# Patient Record
Sex: Female | Born: 1950
Health system: Southern US, Community
[De-identification: ages and names within clinical notes are randomized; demographics above are authoritative.]

## PROBLEM LIST (undated history)

## (undated) DIAGNOSIS — E785 Hyperlipidemia, unspecified: Secondary | ICD-10-CM

## (undated) DIAGNOSIS — T7840XA Allergy, unspecified, initial encounter: Secondary | ICD-10-CM

## (undated) DIAGNOSIS — I10 Essential (primary) hypertension: Secondary | ICD-10-CM

## (undated) DIAGNOSIS — R7303 Prediabetes: Secondary | ICD-10-CM

## (undated) DIAGNOSIS — I499 Cardiac arrhythmia, unspecified: Secondary | ICD-10-CM

## (undated) DIAGNOSIS — D689 Coagulation defect, unspecified: Secondary | ICD-10-CM

## (undated) DIAGNOSIS — I219 Acute myocardial infarction, unspecified: Secondary | ICD-10-CM

## (undated) DIAGNOSIS — I2699 Other pulmonary embolism without acute cor pulmonale: Secondary | ICD-10-CM

## (undated) DIAGNOSIS — R0600 Dyspnea, unspecified: Secondary | ICD-10-CM

## (undated) DIAGNOSIS — I4891 Unspecified atrial fibrillation: Secondary | ICD-10-CM

## (undated) DIAGNOSIS — Z9289 Personal history of other medical treatment: Secondary | ICD-10-CM

## (undated) DIAGNOSIS — D649 Anemia, unspecified: Secondary | ICD-10-CM

## (undated) DIAGNOSIS — G709 Myoneural disorder, unspecified: Secondary | ICD-10-CM

## (undated) DIAGNOSIS — M169 Osteoarthritis of hip, unspecified: Secondary | ICD-10-CM

## (undated) DIAGNOSIS — G43909 Migraine, unspecified, not intractable, without status migrainosus: Secondary | ICD-10-CM

## (undated) DIAGNOSIS — M1990 Unspecified osteoarthritis, unspecified site: Secondary | ICD-10-CM

## (undated) DIAGNOSIS — I509 Heart failure, unspecified: Secondary | ICD-10-CM

## (undated) HISTORY — PX: COLONOSCOPY: SHX174

## (undated) HISTORY — DX: Migraine, unspecified, not intractable, without status migrainosus: G43.909

## (undated) HISTORY — PX: TONSILLECTOMY: SUR1361

## (undated) HISTORY — PX: ABDOMINAL HYSTERECTOMY: SHX81

## (undated) HISTORY — PX: TUBAL LIGATION: SHX77

## (undated) HISTORY — DX: Coagulation defect, unspecified: D68.9

## (undated) HISTORY — DX: Myoneural disorder, unspecified: G70.9

## (undated) HISTORY — DX: Unspecified atrial fibrillation: I48.91

## (undated) HISTORY — PX: BUNIONECTOMY: SHX129

## (undated) HISTORY — DX: Essential (primary) hypertension: I10

## (undated) HISTORY — DX: Anemia, unspecified: D64.9

## (undated) HISTORY — DX: Hyperlipidemia, unspecified: E78.5

## (undated) HISTORY — DX: Acute myocardial infarction, unspecified: I21.9

## (undated) HISTORY — DX: Allergy, unspecified, initial encounter: T78.40XA

## (undated) HISTORY — DX: Osteoarthritis of hip, unspecified: M16.9

## (undated) HISTORY — PX: SPINE SURGERY: SHX786

## (undated) HISTORY — DX: Other pulmonary embolism without acute cor pulmonale: I26.99

## (undated) HISTORY — PX: DG TUMB RIGHT HAND: HXRAD1659

## (undated) HISTORY — DX: Unspecified osteoarthritis, unspecified site: M19.90

## (undated) HISTORY — PX: CARDIAC CATHETERIZATION: SHX172

## (undated) HISTORY — PX: JOINT REPLACEMENT: SHX530

---
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
## 1998-06-09 ENCOUNTER — Encounter: Admission: RE | Admit: 1998-06-09 | Discharge: 1998-06-09 | Payer: Self-pay | Admitting: *Deleted

## 1998-08-19 ENCOUNTER — Ambulatory Visit (HOSPITAL_COMMUNITY): Admission: RE | Admit: 1998-08-19 | Discharge: 1998-08-19 | Payer: Self-pay | Admitting: Internal Medicine

## 1998-10-23 DIAGNOSIS — I219 Acute myocardial infarction, unspecified: Secondary | ICD-10-CM

## 1998-10-23 HISTORY — DX: Acute myocardial infarction, unspecified: I21.9

## 1999-07-14 ENCOUNTER — Inpatient Hospital Stay (HOSPITAL_COMMUNITY): Admission: EM | Admit: 1999-07-14 | Discharge: 1999-07-17 | Payer: Self-pay | Admitting: Emergency Medicine

## 1999-07-14 ENCOUNTER — Encounter: Payer: Self-pay | Admitting: Emergency Medicine

## 1999-09-13 ENCOUNTER — Ambulatory Visit (HOSPITAL_COMMUNITY): Admission: RE | Admit: 1999-09-13 | Discharge: 1999-09-13 | Payer: Self-pay | Admitting: Internal Medicine

## 1999-09-13 ENCOUNTER — Encounter: Payer: Self-pay | Admitting: Internal Medicine

## 2000-05-02 ENCOUNTER — Emergency Department (HOSPITAL_COMMUNITY): Admission: EM | Admit: 2000-05-02 | Discharge: 2000-05-02 | Payer: Self-pay | Admitting: Emergency Medicine

## 2000-05-02 ENCOUNTER — Encounter: Payer: Self-pay | Admitting: Emergency Medicine

## 2001-09-06 ENCOUNTER — Encounter: Admission: RE | Admit: 2001-09-06 | Discharge: 2001-09-06 | Payer: Self-pay | Admitting: Internal Medicine

## 2001-09-06 ENCOUNTER — Encounter: Payer: Self-pay | Admitting: Internal Medicine

## 2001-12-13 ENCOUNTER — Emergency Department (HOSPITAL_COMMUNITY): Admission: EM | Admit: 2001-12-13 | Discharge: 2001-12-13 | Payer: Self-pay | Admitting: Emergency Medicine

## 2001-12-14 ENCOUNTER — Encounter: Payer: Self-pay | Admitting: Emergency Medicine

## 2001-12-20 ENCOUNTER — Encounter: Admission: RE | Admit: 2001-12-20 | Discharge: 2001-12-20 | Payer: Self-pay | Admitting: Internal Medicine

## 2001-12-20 ENCOUNTER — Encounter: Payer: Self-pay | Admitting: Internal Medicine

## 2002-10-29 ENCOUNTER — Encounter: Payer: Self-pay | Admitting: Internal Medicine

## 2002-10-29 ENCOUNTER — Encounter: Admission: RE | Admit: 2002-10-29 | Discharge: 2002-10-29 | Payer: Self-pay | Admitting: Internal Medicine

## 2002-12-12 ENCOUNTER — Encounter: Payer: Self-pay | Admitting: Emergency Medicine

## 2002-12-12 ENCOUNTER — Emergency Department (HOSPITAL_COMMUNITY): Admission: EM | Admit: 2002-12-12 | Discharge: 2002-12-13 | Payer: Self-pay | Admitting: Emergency Medicine

## 2003-11-03 ENCOUNTER — Encounter: Admission: RE | Admit: 2003-11-03 | Discharge: 2003-11-03 | Payer: Self-pay | Admitting: Internal Medicine

## 2004-03-16 ENCOUNTER — Emergency Department (HOSPITAL_COMMUNITY): Admission: EM | Admit: 2004-03-16 | Discharge: 2004-03-16 | Payer: Self-pay | Admitting: Family Medicine

## 2004-09-02 ENCOUNTER — Ambulatory Visit: Payer: Self-pay | Admitting: Gastroenterology

## 2004-09-19 ENCOUNTER — Ambulatory Visit: Payer: Self-pay | Admitting: Gastroenterology

## 2005-01-03 ENCOUNTER — Encounter: Admission: RE | Admit: 2005-01-03 | Discharge: 2005-01-03 | Payer: Self-pay | Admitting: Internal Medicine

## 2005-01-24 ENCOUNTER — Encounter: Admission: RE | Admit: 2005-01-24 | Discharge: 2005-01-24 | Payer: Self-pay | Admitting: Internal Medicine

## 2005-11-14 ENCOUNTER — Encounter: Admission: RE | Admit: 2005-11-14 | Discharge: 2005-11-14 | Payer: Self-pay | Admitting: Occupational Medicine

## 2005-12-18 ENCOUNTER — Emergency Department (HOSPITAL_COMMUNITY): Admission: EM | Admit: 2005-12-18 | Discharge: 2005-12-18 | Payer: Self-pay | Admitting: Emergency Medicine

## 2006-01-24 ENCOUNTER — Encounter: Admission: RE | Admit: 2006-01-24 | Discharge: 2006-01-24 | Payer: Self-pay | Admitting: Internal Medicine

## 2007-01-18 ENCOUNTER — Encounter: Admission: RE | Admit: 2007-01-18 | Discharge: 2007-01-18 | Payer: Self-pay | Admitting: Internal Medicine

## 2007-07-16 ENCOUNTER — Emergency Department (HOSPITAL_COMMUNITY): Admission: EM | Admit: 2007-07-16 | Discharge: 2007-07-16 | Payer: Self-pay | Admitting: Family Medicine

## 2007-08-15 ENCOUNTER — Emergency Department (HOSPITAL_COMMUNITY): Admission: EM | Admit: 2007-08-15 | Discharge: 2007-08-15 | Payer: Self-pay | Admitting: Emergency Medicine

## 2008-01-21 ENCOUNTER — Encounter: Admission: RE | Admit: 2008-01-21 | Discharge: 2008-01-21 | Payer: Self-pay | Admitting: Internal Medicine

## 2008-08-25 LAB — HEPATIC FUNCTION PANEL
ALT: 9 U/L (ref 7–35)
AST: 16 U/L (ref 13–35)
Alkaline Phosphatase: 99 U/L (ref 25–125)
Bilirubin, Total: 0.7 mg/dL

## 2008-08-25 LAB — CBC AND DIFFERENTIAL
HCT: 37 % (ref 36–46)
Hemoglobin: 11.1 g/dL — AB (ref 12.0–16.0)
Platelets: 336 10*3/uL (ref 150–399)
WBC: 7.4 10^3/mL

## 2008-08-25 LAB — BASIC METABOLIC PANEL
BUN: 11 mg/dL (ref 4–21)
Creatinine: 0.8 mg/dL (ref 0.5–1.1)
Glucose: 80 mg/dL
Potassium: 4.6 mmol/L (ref 3.4–5.3)
Sodium: 141 mmol/L (ref 137–147)

## 2008-08-25 LAB — LIPID PANEL
Cholesterol: 180 mg/dL (ref 0–200)
HDL: 44 mg/dL (ref 35–70)
LDL Cholesterol: 118 mg/dL
Triglycerides: 91 mg/dL (ref 40–160)

## 2008-09-05 ENCOUNTER — Emergency Department (HOSPITAL_COMMUNITY): Admission: EM | Admit: 2008-09-05 | Discharge: 2008-09-05 | Payer: Self-pay | Admitting: Emergency Medicine

## 2008-12-23 ENCOUNTER — Emergency Department (HOSPITAL_COMMUNITY): Admission: EM | Admit: 2008-12-23 | Discharge: 2008-12-23 | Payer: Self-pay | Admitting: Family Medicine

## 2009-01-21 ENCOUNTER — Encounter: Admission: RE | Admit: 2009-01-21 | Discharge: 2009-01-21 | Payer: Self-pay | Admitting: Internal Medicine

## 2009-04-14 ENCOUNTER — Emergency Department (HOSPITAL_COMMUNITY): Admission: EM | Admit: 2009-04-14 | Discharge: 2009-04-14 | Payer: Self-pay | Admitting: Family Medicine

## 2009-04-15 ENCOUNTER — Encounter: Payer: Self-pay | Admitting: Family Medicine

## 2009-04-15 ENCOUNTER — Ambulatory Visit: Admission: RE | Admit: 2009-04-15 | Discharge: 2009-04-15 | Payer: Self-pay | Admitting: Family Medicine

## 2009-04-15 ENCOUNTER — Ambulatory Visit: Payer: Self-pay | Admitting: Surgery

## 2009-04-16 LAB — HEPATIC FUNCTION PANEL
ALT: 8 U/L (ref 7–35)
AST: 11 U/L — AB (ref 13–35)
Alkaline Phosphatase: 102 U/L (ref 25–125)
Bilirubin, Direct: 0.1 mg/dL (ref 0.01–0.4)
Bilirubin, Total: 0.4 mg/dL

## 2009-04-16 LAB — LIPID PANEL
Cholesterol: 185 mg/dL (ref 0–200)
HDL: 37 mg/dL (ref 35–70)
LDL Cholesterol: 132 mg/dL
Triglycerides: 78 mg/dL (ref 40–160)

## 2009-08-25 ENCOUNTER — Encounter: Admission: RE | Admit: 2009-08-25 | Discharge: 2009-08-25 | Payer: Self-pay | Admitting: Internal Medicine

## 2009-08-25 LAB — BASIC METABOLIC PANEL
BUN: 17 mg/dL (ref 4–21)
Creatinine: 0.8 mg/dL (ref 0.5–1.1)
Glucose: 90 mg/dL
Potassium: 4.2 mmol/L (ref 3.4–5.3)
Sodium: 140 mmol/L (ref 137–147)

## 2009-08-25 LAB — HEPATIC FUNCTION PANEL
ALT: 8 U/L (ref 7–35)
AST: 14 U/L (ref 13–35)
Alkaline Phosphatase: 98 U/L (ref 25–125)
Bilirubin, Total: 0.5 mg/dL

## 2009-08-25 LAB — CBC AND DIFFERENTIAL
HCT: 36 % (ref 36–46)
Hemoglobin: 10.9 g/dL — AB (ref 12.0–16.0)
Platelets: 358 10*3/uL (ref 150–399)
WBC: 7.5 10^3/mL

## 2009-08-25 LAB — LIPID PANEL
Cholesterol: 204 mg/dL — AB (ref 0–200)
HDL: 38 mg/dL (ref 35–70)
LDL Cholesterol: 145 mg/dL
Triglycerides: 107 mg/dL (ref 40–160)

## 2009-09-19 ENCOUNTER — Encounter: Admission: RE | Admit: 2009-09-19 | Discharge: 2009-09-19 | Payer: Self-pay | Admitting: Internal Medicine

## 2009-11-19 ENCOUNTER — Encounter: Admission: RE | Admit: 2009-11-19 | Discharge: 2009-11-19 | Payer: Self-pay | Admitting: Specialist

## 2010-02-18 ENCOUNTER — Encounter: Admission: RE | Admit: 2010-02-18 | Discharge: 2010-02-18 | Payer: Self-pay | Admitting: Internal Medicine

## 2011-01-30 LAB — CBC
HCT: 34.6 % — ABNORMAL LOW (ref 36.0–46.0)
Hemoglobin: 11.4 g/dL — ABNORMAL LOW (ref 12.0–15.0)
MCHC: 32.8 g/dL (ref 30.0–36.0)
MCV: 87.9 fL (ref 78.0–100.0)
Platelets: 320 10*3/uL (ref 150–400)
RBC: 3.94 MIL/uL (ref 3.87–5.11)
RDW: 14.5 % (ref 11.5–15.5)
WBC: 6.4 10*3/uL (ref 4.0–10.5)

## 2011-01-30 LAB — DIFFERENTIAL
Basophils Absolute: 0 10*3/uL (ref 0.0–0.1)
Basophils Relative: 0 % (ref 0–1)
Eosinophils Absolute: 0.2 10*3/uL (ref 0.0–0.7)
Eosinophils Relative: 3 % (ref 0–5)
Lymphocytes Relative: 30 % (ref 12–46)
Lymphs Abs: 1.9 10*3/uL (ref 0.7–4.0)
Monocytes Absolute: 0.5 10*3/uL (ref 0.1–1.0)
Monocytes Relative: 7 % (ref 3–12)
Neutro Abs: 3.8 10*3/uL (ref 1.7–7.7)
Neutrophils Relative %: 59 % (ref 43–77)

## 2011-03-22 ENCOUNTER — Other Ambulatory Visit: Payer: Self-pay | Admitting: Internal Medicine

## 2011-03-22 DIAGNOSIS — Z1231 Encounter for screening mammogram for malignant neoplasm of breast: Secondary | ICD-10-CM

## 2011-03-28 ENCOUNTER — Ambulatory Visit
Admission: RE | Admit: 2011-03-28 | Discharge: 2011-03-28 | Disposition: A | Discharge status: Home / Self Care | Payer: Self-pay | Source: Ambulatory Visit | Attending: Internal Medicine | Admitting: Internal Medicine

## 2011-03-28 DIAGNOSIS — Z1231 Encounter for screening mammogram for malignant neoplasm of breast: Secondary | ICD-10-CM

## 2011-08-02 LAB — POCT RAPID STREP A: Streptococcus, Group A Screen (Direct): NEGATIVE

## 2011-08-03 LAB — POCT URINALYSIS DIP (DEVICE)
Bilirubin Urine: NEGATIVE
Glucose, UA: NEGATIVE
Ketones, ur: NEGATIVE
Nitrite: NEGATIVE
Operator id: 235561
Protein, ur: NEGATIVE
Specific Gravity, Urine: 1.02
Urobilinogen, UA: 1
pH: 6.5

## 2012-02-16 ENCOUNTER — Other Ambulatory Visit: Payer: Self-pay | Admitting: Internal Medicine

## 2012-02-16 DIAGNOSIS — Z1231 Encounter for screening mammogram for malignant neoplasm of breast: Secondary | ICD-10-CM

## 2012-03-29 ENCOUNTER — Ambulatory Visit
Admission: RE | Admit: 2012-03-29 | Discharge: 2012-03-29 | Disposition: A | Discharge status: Home / Self Care | Payer: Medicare HMO | Source: Ambulatory Visit | Attending: Internal Medicine | Admitting: Internal Medicine

## 2012-03-29 DIAGNOSIS — Z1231 Encounter for screening mammogram for malignant neoplasm of breast: Secondary | ICD-10-CM

## 2012-04-09 ENCOUNTER — Ambulatory Visit (HOSPITAL_COMMUNITY)
Admission: RE | Admit: 2012-04-09 | Discharge: 2012-04-09 | Disposition: A | Discharge status: Home / Self Care | Payer: Medicare HMO | Source: Ambulatory Visit | Attending: Internal Medicine | Admitting: Internal Medicine

## 2012-04-09 DIAGNOSIS — M7989 Other specified soft tissue disorders: Secondary | ICD-10-CM

## 2012-04-09 DIAGNOSIS — M79609 Pain in unspecified limb: Secondary | ICD-10-CM | POA: Insufficient documentation

## 2012-04-09 DIAGNOSIS — R52 Pain, unspecified: Secondary | ICD-10-CM

## 2012-04-09 NOTE — Progress Notes (Signed)
Right lower extremity venous duplex completed.  Preliminary report is negative for DVT, SVT, or a Baker's cyst in the right leg.  Negative for DVT in the left common femoral vein. 

## 2012-07-25 ENCOUNTER — Encounter: Payer: Self-pay | Admitting: Gastroenterology

## 2012-08-16 ENCOUNTER — Encounter: Payer: Self-pay | Admitting: Gastroenterology

## 2012-08-27 ENCOUNTER — Ambulatory Visit (AMBULATORY_SURGERY_CENTER): Payer: Medicare HMO

## 2012-08-27 VITALS — Ht 64.0 in | Wt 168.8 lb

## 2012-08-27 DIAGNOSIS — Z1211 Encounter for screening for malignant neoplasm of colon: Secondary | ICD-10-CM

## 2012-08-27 MED ORDER — NA SULFATE-K SULFATE-MG SULF 17.5-3.13-1.6 GM/177ML PO SOLN: 1.0000 | Freq: Once | ORAL | Status: DC

## 2012-08-29 LAB — BASIC METABOLIC PANEL
BUN: 16 mg/dL (ref 4–21)
Creatinine: 0.8 mg/dL (ref 0.5–1.1)
Glucose: 80 mg/dL
Potassium: 3.9 mmol/L (ref 3.4–5.3)
Sodium: 140 mmol/L (ref 137–147)

## 2012-08-29 LAB — LIPID PANEL
Cholesterol: 235 mg/dL — AB (ref 0–200)
HDL: 36 mg/dL (ref 35–70)
LDL Cholesterol: 177 mg/dL
Triglycerides: 110 mg/dL (ref 40–160)

## 2012-08-29 LAB — CBC AND DIFFERENTIAL
HCT: 32 % — AB (ref 36–46)
Hemoglobin: 10.6 g/dL — AB (ref 12.0–16.0)
Platelets: 365 10*3/uL (ref 150–399)
WBC: 6 10^3/mL

## 2012-08-29 LAB — HEPATIC FUNCTION PANEL
ALT: 9 U/L (ref 7–35)
AST: 16 U/L (ref 13–35)
Alkaline Phosphatase: 83 U/L (ref 25–125)
Bilirubin, Total: 0.7 mg/dL

## 2012-09-10 ENCOUNTER — Encounter: Payer: Self-pay | Admitting: Gastroenterology

## 2012-09-10 ENCOUNTER — Ambulatory Visit (AMBULATORY_SURGERY_CENTER): Payer: Medicare HMO | Admitting: Gastroenterology

## 2012-09-10 VITALS — BP 128/71 | HR 65 | Temp 98.5°F | Resp 22 | Ht 64.0 in | Wt 168.0 lb

## 2012-09-10 DIAGNOSIS — Z1211 Encounter for screening for malignant neoplasm of colon: Secondary | ICD-10-CM

## 2012-09-10 DIAGNOSIS — K5730 Diverticulosis of large intestine without perforation or abscess without bleeding: Secondary | ICD-10-CM

## 2012-09-10 MED ORDER — SODIUM CHLORIDE 0.9 % IV SOLN: 500.0000 mL | INTRAVENOUS | Status: DC

## 2012-09-10 NOTE — Progress Notes (Signed)
Propofol given over incremental dosages 

## 2012-09-10 NOTE — Progress Notes (Signed)
Patient did not experience any of the following events: a burn prior to discharge; a fall within the facility; wrong site/side/patient/procedure/implant event; or a hospital transfer or hospital admission upon discharge from the facility. (G8907) Patient did not have preoperative order for IV antibiotic SSI prophylaxis. (G8918)   Charted by April Mirts RN 

## 2012-09-10 NOTE — Patient Instructions (Addendum)
YOU HAD AN ENDOSCOPIC PROCEDURE TODAY AT THE [facility]: Refer to the procedure report that was given to you for any specific questions about what was found during the examination.  If the procedure report does not answer your questions, please call your gastroenterologist to clarify.  If you requested that your care partner not be given the details of your procedure findings, then the procedure report has been included in a sealed envelope for you to review at your convenience later.  YOU SHOULD EXPECT: Some feelings of bloating in the abdomen. Passage of more gas than usual.  Walking can help get rid of the air that was put into your GI tract during the procedure and reduce the bloating. If you had a lower endoscopy (such as a colonoscopy or flexible sigmoidoscopy) you may notice spotting of blood in your stool or on the toilet paper. If you underwent a bowel prep for your procedure, then you may not have a normal bowel movement for a few days.  DIET: Your first meal following the procedure should be a light meal and then it is ok to progress to your normal diet.  A half-sandwich or bowl of soup is an example of a good first meal.  Heavy or fried foods are harder to digest and may make you feel nauseous or bloated.  Likewise meals heavy in dairy and vegetables can cause extra gas to form and this can also increase the bloating.  Drink plenty of fluids but you should avoid alcoholic beverages for 24 hours.  ACTIVITY: Your care partner should take you home directly after the procedure.  You should plan to take it easy, moving slowly for the rest of the day.  You can resume normal activity the day after the procedure however you should NOT DRIVE or use heavy machinery for 24 hours (because of the sedation medicines used during the test).    SYMPTOMS TO REPORT IMMEDIATELY: A gastroenterologist can be reached at any hour.  During normal business hours, 8:30 AM to 5:00 PM Monday through Friday,  call [phone].  After hours and on weekends, please call the GI answering service at [phone] who will take a message and have the physician on call contact you.   Following lower endoscopy (colonoscopy or flexible sigmoidoscopy):  Excessive amounts of blood in the stool  Significant tenderness or worsening of abdominal pains  Swelling of the abdomen that is new, acute  Fever of 100F or higher  FOLLOW UP: If any biopsies were taken you will be contacted by phone or by letter within the next 1-3 weeks.  Call your gastroenterologist if you have not heard about the biopsies in 3 weeks.  Our staff will call the home number listed on your records the next business day following your procedure to check on you and address any questions or concerns that you may have at that time regarding the information given to you following your procedure. This is a courtesy call and so if there is no answer at the home number and we have not heard from you through the emergency physician on call, we will assume that you have returned to your regular daily activities without incident.  SIGNATURES/CONFIDENTIALITY: You and/or your care partner have signed paperwork which will be entered into your electronic medical record.  These signatures attest to the fact that that the information above on your After Visit Summary has been reviewed and is understood.  Full responsibility of the confidentiality of this   discharge information lies with you and/or your care-partner.   Diverticulosis-handout given  High Fiber Diet-handout given   

## 2012-09-10 NOTE — Op Note (Signed)
[facility] [address] [place], [address]   COLONOSCOPY PROCEDURE REPORT  PATIENT: Samantha, Stevenson  MR#: [id number] BIRTHDATE: September 24, 1951 , 61  yrs. old GENDER: Female ENDOSCOPIST: Louis Meckel, MD REFERRED XB:JYNWG Chilton Si, M.D. PROCEDURE DATE:  09/10/2012 PROCEDURE:   Colonoscopy, diagnostic ASA CLASS:   Class II INDICATIONS: MEDICATIONS: MAC sedation, administered by CRNA and propofol (Diprivan) 100mg  IV  DESCRIPTION OF PROCEDURE:   After the risks benefits and alternatives of the procedure were thoroughly explained, informed consent was obtained.  A digital rectal exam revealed no abnormalities of the rectum.   The LB [id number]  endoscope was introduced through the anus and advanced to the cecum, which was identified by both the appendix and ileocecal valve. No adverse events experienced.   The quality of the prep was Suprep excellent The instrument was then slowly withdrawn as the colon was fully examined.      COLON FINDINGS: Severe diverticulosis was noted in the sigmoid colon.   There are areas of submucosal hemorrhage in the rectal vault that probably is due to the prep. The colon mucosa was otherwise normal.  Retroflexed views revealed no abnormalities. The time to cecum=5 minutes 50 seconds.  Withdrawal time=6 minutes 59 seconds.  The scope was withdrawn and the procedure completed. COMPLICATIONS: There were no complications.  ENDOSCOPIC IMPRESSION: 1.   Severe diverticulosis was noted in the sigmoid colon 2.   The colon mucosa was otherwise normal  RECOMMENDATIONS: Continue current colorectal screening recommendations for "routine risk" patients with a repeat colonoscopy in 10 years.   eSigned:  Louis Meckel, MD 09/10/2012 4:30 PM   cc:

## 2012-09-11 ENCOUNTER — Telehealth: Payer: Self-pay

## 2012-09-11 NOTE — Telephone Encounter (Signed)
  Follow up Call-  Call back number 09/10/2012  Post procedure Call Back phone  # [phone] or [phone]  Permission to leave phone message Yes     Patient questions:  Do you have a fever, pain , or abdominal swelling? no Pain Score  0 *  Have you tolerated food without any problems? yes  Have you been able to return to your normal activities? yes  Do you have any questions about your discharge instructions: Diet   no Medications  no Follow up visit  no  Do you have questions or concerns about your Care? no  Actions: * If pain score is 4 or above: No action needed, pain <4.

## 2012-11-22 ENCOUNTER — Other Ambulatory Visit (HOSPITAL_COMMUNITY): Payer: Self-pay | Admitting: Orthopaedic Surgery

## 2012-12-23 ENCOUNTER — Encounter (HOSPITAL_COMMUNITY): Payer: Self-pay | Admitting: Pharmacy Technician

## 2012-12-26 ENCOUNTER — Other Ambulatory Visit (HOSPITAL_COMMUNITY): Payer: Self-pay | Admitting: *Deleted

## 2012-12-27 ENCOUNTER — Inpatient Hospital Stay (HOSPITAL_COMMUNITY): Admission: RE | Admit: 2012-12-27 | Payer: Medicare HMO | Source: Ambulatory Visit

## 2012-12-30 NOTE — Patient Instructions (Addendum)
Samantha Stevenson  12/30/2012   Your procedure is scheduled on: 01/03/13   Report to [facility] at     AM.  Call this number if you have problems the morning of surgery: [phone]   Remember:   Do not eat food or drink liquids after midnight.   Take these medicines the morning of surgery with A SIP OF WATER: NONE (UNABLE TO TAKE DIGOXIN / DILTIAZEM WITHOUT FOOD PER PT)   Do not wear jewelry, make-up or nail polish.  Do not wear lotions, powders, or perfumes.   Do not shave 48 hours prior to surgery.  Do not bring valuables to the hospital.  Contacts, dentures or bridgework may not be worn into surgery.  Leave suitcase in the car. After surgery it may be brought to your room.  For patients admitted to the hospital, checkout time is 11:00 AM the day of  discharge.   SEE CHG INSTRUCTION SHEET    Please read over the following fact sheets that you were given: MRSA Information, Blood Transfusion Fact sheet , Shower instruction sheet               Failure to comply with these instructions may result in cancellation of your surgery.                Patient Signature ____________________________              Nurse Signature _____________________________

## 2012-12-31 ENCOUNTER — Encounter (HOSPITAL_COMMUNITY)
Admission: RE | Admit: 2012-12-31 | Discharge: 2012-12-31 | Disposition: A | Discharge status: Home / Self Care | Payer: Medicare HMO | Source: Ambulatory Visit | Attending: Orthopaedic Surgery | Admitting: Orthopaedic Surgery

## 2012-12-31 ENCOUNTER — Ambulatory Visit (HOSPITAL_COMMUNITY)
Admission: RE | Admit: 2012-12-31 | Discharge: 2012-12-31 | Disposition: A | Discharge status: Home / Self Care | Payer: Medicare HMO | Source: Ambulatory Visit | Attending: Orthopaedic Surgery | Admitting: Orthopaedic Surgery

## 2012-12-31 ENCOUNTER — Encounter (HOSPITAL_COMMUNITY): Payer: Self-pay

## 2012-12-31 DIAGNOSIS — M169 Osteoarthritis of hip, unspecified: Secondary | ICD-10-CM | POA: Insufficient documentation

## 2012-12-31 DIAGNOSIS — I10 Essential (primary) hypertension: Secondary | ICD-10-CM | POA: Insufficient documentation

## 2012-12-31 DIAGNOSIS — E785 Hyperlipidemia, unspecified: Secondary | ICD-10-CM | POA: Insufficient documentation

## 2012-12-31 DIAGNOSIS — M1610 Unilateral primary osteoarthritis, unspecified hip: Secondary | ICD-10-CM | POA: Insufficient documentation

## 2012-12-31 DIAGNOSIS — I252 Old myocardial infarction: Secondary | ICD-10-CM | POA: Insufficient documentation

## 2012-12-31 DIAGNOSIS — K219 Gastro-esophageal reflux disease without esophagitis: Secondary | ICD-10-CM | POA: Insufficient documentation

## 2012-12-31 DIAGNOSIS — M4120 Other idiopathic scoliosis, site unspecified: Secondary | ICD-10-CM | POA: Insufficient documentation

## 2012-12-31 DIAGNOSIS — I4891 Unspecified atrial fibrillation: Secondary | ICD-10-CM | POA: Insufficient documentation

## 2012-12-31 DIAGNOSIS — Z01812 Encounter for preprocedural laboratory examination: Secondary | ICD-10-CM | POA: Insufficient documentation

## 2012-12-31 LAB — CBC
HCT: 33.8 % — ABNORMAL LOW (ref 36.0–46.0)
Hemoglobin: 10.6 g/dL — ABNORMAL LOW (ref 12.0–15.0)
MCH: 26.9 pg (ref 26.0–34.0)
MCHC: 31.4 g/dL (ref 30.0–36.0)
MCV: 85.8 fL (ref 78.0–100.0)
Platelets: 386 10*3/uL (ref 150–400)
RBC: 3.94 MIL/uL (ref 3.87–5.11)
RDW: 14.8 % (ref 11.5–15.5)
WBC: 10.2 10*3/uL (ref 4.0–10.5)

## 2012-12-31 LAB — BASIC METABOLIC PANEL
BUN: 16 mg/dL (ref 6–23)
CO2: 27 mEq/L (ref 19–32)
Calcium: 9.4 mg/dL (ref 8.4–10.5)
Chloride: 102 mEq/L (ref 96–112)
Creatinine, Ser: 0.74 mg/dL (ref 0.50–1.10)
GFR calc Af Amer: >90 mL/min (ref >90)
GFR calc non Af Amer: >90 mL/min (ref >90)
Glucose, Bld: 84 mg/dL (ref 70–99)
Potassium: 4.1 mEq/L (ref 3.5–5.1)
Sodium: 138 mEq/L (ref 135–145)

## 2012-12-31 LAB — PROTIME-INR
INR: 1.01 (ref 0.00–1.49)
Prothrombin Time: 13.2 seconds (ref 11.6–15.2)

## 2012-12-31 LAB — URINALYSIS, ROUTINE W REFLEX MICROSCOPIC
Bilirubin Urine: NEGATIVE
Glucose, UA: NEGATIVE mg/dL
Hgb urine dipstick: NEGATIVE
Ketones, ur: NEGATIVE mg/dL
Leukocytes, UA: NEGATIVE
Nitrite: NEGATIVE
Protein, ur: NEGATIVE mg/dL
Specific Gravity, Urine: 1.029 (ref 1.005–1.030)
Urobilinogen, UA: 0.2 mg/dL (ref 0.0–1.0)
pH: 5 (ref 5.0–8.0)

## 2012-12-31 LAB — SURGICAL PCR SCREEN
MRSA, PCR: NEGATIVE
Staphylococcus aureus: NEGATIVE

## 2012-12-31 LAB — ABO/RH: ABO/RH(D): B POS

## 2012-12-31 LAB — APTT: aPTT: 32 seconds (ref 24–37)

## 2012-12-31 NOTE — Progress Notes (Signed)
Dr. Council Mechanic made aware of fact that pt insists she is unable to take meds without food or she will vomit meds . He said to put note on front of chart for anesthesia and they can give med via IV if they need to. Note placed as Requested.

## 2012-12-31 NOTE — Progress Notes (Signed)
12/31/12 0944  OBSTRUCTIVE SLEEP APNEA  Have you ever been diagnosed with sleep apnea through a sleep study? No  Do you snore loudly (loud enough to be heard through closed doors)?  1  Do you often feel tired, fatigued, or sleepy during the daytime? 1  Has anyone observed you stop breathing during your sleep? 1  Do you have, or are you being treated for high blood pressure? 1  BMI more than 35 kg/m2? 0  Age over 60 years old? 1  Gender: 0  Obstructive Sleep Apnea Score 5  Score 4 or greater  Results sent to PCP

## 2013-01-03 ENCOUNTER — Encounter (HOSPITAL_COMMUNITY): Payer: Self-pay | Admitting: Anesthesiology

## 2013-01-03 ENCOUNTER — Inpatient Hospital Stay (HOSPITAL_COMMUNITY): Payer: Medicare HMO

## 2013-01-03 ENCOUNTER — Encounter (HOSPITAL_COMMUNITY): Payer: Self-pay | Admitting: *Deleted

## 2013-01-03 ENCOUNTER — Encounter (HOSPITAL_COMMUNITY)
Admission: RE | Disposition: A | Discharge status: Home Health | Payer: Self-pay | Source: Ambulatory Visit | Attending: Orthopaedic Surgery

## 2013-01-03 ENCOUNTER — Inpatient Hospital Stay (HOSPITAL_COMMUNITY): Payer: Medicare HMO | Admitting: Anesthesiology

## 2013-01-03 ENCOUNTER — Inpatient Hospital Stay (HOSPITAL_COMMUNITY)
Admission: RE | Admit: 2013-01-03 | Discharge: 2013-01-07 | DRG: 470 | Disposition: A | Discharge status: Home Health | Payer: Medicare HMO | Source: Ambulatory Visit | Attending: Orthopaedic Surgery | Admitting: Orthopaedic Surgery

## 2013-01-03 DIAGNOSIS — D649 Anemia, unspecified: Secondary | ICD-10-CM | POA: Diagnosis present

## 2013-01-03 DIAGNOSIS — M169 Osteoarthritis of hip, unspecified: Secondary | ICD-10-CM

## 2013-01-03 DIAGNOSIS — I252 Old myocardial infarction: Secondary | ICD-10-CM

## 2013-01-03 DIAGNOSIS — I4891 Unspecified atrial fibrillation: Secondary | ICD-10-CM | POA: Diagnosis present

## 2013-01-03 DIAGNOSIS — M1990 Unspecified osteoarthritis, unspecified site: Secondary | ICD-10-CM

## 2013-01-03 DIAGNOSIS — I10 Essential (primary) hypertension: Secondary | ICD-10-CM | POA: Diagnosis present

## 2013-01-03 DIAGNOSIS — M1610 Unilateral primary osteoarthritis, unspecified hip: Principal | ICD-10-CM | POA: Diagnosis present

## 2013-01-03 HISTORY — PX: TOTAL HIP ARTHROPLASTY: SHX124

## 2013-01-03 LAB — TYPE AND SCREEN
ABO/RH(D): B POS
Antibody Screen: NEGATIVE

## 2013-01-03 SURGERY — ARTHROPLASTY, HIP, TOTAL, ANTERIOR APPROACH
Anesthesia: General | Site: Hip | Laterality: Left | Wound class: Clean

## 2013-01-03 MED ORDER — DOCUSATE SODIUM 100 MG PO CAPS
100.0000 mg | ORAL_CAPSULE | Freq: Two times a day (BID) | ORAL | Status: DC
  Administered 2013-01-03 – 2013-01-07 (×8): 100 mg via ORAL
  Filled 2013-01-03 (×5): qty 1

## 2013-01-03 MED ORDER — PHENOL 1.4 % MT LIQD: 1.0000 | OROMUCOSAL | Status: DC | PRN

## 2013-01-03 MED ORDER — 0.9 % SODIUM CHLORIDE (POUR BTL) OPTIME
TOPICAL | Status: DC | PRN
  Administered 2013-01-03: 1000 mL

## 2013-01-03 MED ORDER — METHOCARBAMOL 500 MG PO TABS
500.0000 mg | ORAL_TABLET | Freq: Four times a day (QID) | ORAL | Status: DC | PRN
  Administered 2013-01-06 – 2013-01-07 (×2): 500 mg via ORAL
  Filled 2013-01-03 (×2): qty 1

## 2013-01-03 MED ORDER — FENTANYL CITRATE 0.05 MG/ML IJ SOLN
INTRAMUSCULAR | Status: DC | PRN
  Administered 2013-01-03 (×2): 50 ug via INTRAVENOUS

## 2013-01-03 MED ORDER — OXYCODONE HCL ER 10 MG PO T12A
10.0000 mg | EXTENDED_RELEASE_TABLET | Freq: Two times a day (BID) | ORAL | Status: DC
  Administered 2013-01-03 – 2013-01-07 (×8): 10 mg via ORAL
  Filled 2013-01-03 (×8): qty 1

## 2013-01-03 MED ORDER — FUROSEMIDE 20 MG PO TABS
20.0000 mg | ORAL_TABLET | Freq: Every day | ORAL | Status: DC | PRN
  Filled 2013-01-03: qty 1

## 2013-01-03 MED ORDER — METOCLOPRAMIDE HCL 10 MG PO TABS: 5.0000 mg | ORAL_TABLET | Freq: Three times a day (TID) | ORAL | Status: DC | PRN

## 2013-01-03 MED ORDER — DIGOXIN 250 MCG PO TABS
0.2500 mg | ORAL_TABLET | Freq: Every morning | ORAL | Status: DC
  Administered 2013-01-03 – 2013-01-07 (×5): 0.25 mg via ORAL
  Filled 2013-01-03 (×5): qty 1

## 2013-01-03 MED ORDER — SODIUM CHLORIDE 0.9 % IV SOLN
INTRAVENOUS | Status: DC | PRN
  Administered 2013-01-03: 1000 mL via INTRAMUSCULAR

## 2013-01-03 MED ORDER — FENTANYL CITRATE 0.05 MG/ML IJ SOLN: 25.0000 ug | INTRAMUSCULAR | Status: DC | PRN

## 2013-01-03 MED ORDER — ACETAMINOPHEN 650 MG RE SUPP: 650.0000 mg | Freq: Four times a day (QID) | RECTAL | Status: DC | PRN

## 2013-01-03 MED ORDER — SODIUM CHLORIDE 0.9 % IV SOLN
INTRAVENOUS | Status: DC
  Administered 2013-01-03 – 2013-01-04 (×3): via INTRAVENOUS

## 2013-01-03 MED ORDER — METHOCARBAMOL 100 MG/ML IJ SOLN: 500.0000 mg | Freq: Four times a day (QID) | INTRAVENOUS | Status: DC | PRN

## 2013-01-03 MED ORDER — MENTHOL 3 MG MT LOZG
1.0000 | LOZENGE | OROMUCOSAL | Status: DC | PRN
  Filled 2013-01-03: qty 9

## 2013-01-03 MED ORDER — BUPIVACAINE HCL (PF) 0.5 % IJ SOLN
INTRAMUSCULAR | Status: DC | PRN
  Administered 2013-01-03: 3 mL

## 2013-01-03 MED ORDER — KETAMINE HCL 10 MG/ML IJ SOLN
INTRAMUSCULAR | Status: DC | PRN
  Administered 2013-01-03: 20 mg via INTRAVENOUS

## 2013-01-03 MED ORDER — PRAVASTATIN SODIUM 40 MG PO TABS
40.0000 mg | ORAL_TABLET | Freq: Every day | ORAL | Status: DC
  Administered 2013-01-03 – 2013-01-06 (×4): 40 mg via ORAL
  Filled 2013-01-03 (×5): qty 1

## 2013-01-03 MED ORDER — DEXAMETHASONE SODIUM PHOSPHATE 10 MG/ML IJ SOLN
INTRAMUSCULAR | Status: DC | PRN
  Administered 2013-01-03: 10 mg via INTRAVENOUS

## 2013-01-03 MED ORDER — PROMETHAZINE HCL 25 MG/ML IJ SOLN: 6.2500 mg | INTRAMUSCULAR | Status: DC | PRN

## 2013-01-03 MED ORDER — LACTATED RINGERS IV SOLN
INTRAVENOUS | Status: DC | PRN
  Administered 2013-01-03 (×3): via INTRAVENOUS

## 2013-01-03 MED ORDER — OXYCODONE HCL 5 MG PO TABS
5.0000 mg | ORAL_TABLET | ORAL | Status: DC | PRN
  Administered 2013-01-03 – 2013-01-06 (×4): 10 mg via ORAL
  Administered 2013-01-06: 5 mg via ORAL
  Administered 2013-01-07 (×2): 10 mg via ORAL
  Filled 2013-01-03 (×4): qty 2
  Filled 2013-01-03: qty 1
  Filled 2013-01-03 (×2): qty 2

## 2013-01-03 MED ORDER — FERROUS SULFATE 325 (65 FE) MG PO TABS
325.0000 mg | ORAL_TABLET | Freq: Three times a day (TID) | ORAL | Status: DC
  Administered 2013-01-03 – 2013-01-07 (×12): 325 mg via ORAL
  Filled 2013-01-03 (×15): qty 1

## 2013-01-03 MED ORDER — HYDROMORPHONE HCL PF 1 MG/ML IJ SOLN
1.0000 mg | INTRAMUSCULAR | Status: DC | PRN
  Filled 2013-01-03: qty 1

## 2013-01-03 MED ORDER — STERILE WATER FOR IRRIGATION IR SOLN
Status: DC | PRN
  Administered 2013-01-03: 3000 mL

## 2013-01-03 MED ORDER — DILTIAZEM HCL ER 240 MG PO CP24
240.0000 mg | ORAL_CAPSULE | Freq: Every morning | ORAL | Status: DC
  Administered 2013-01-03 – 2013-01-07 (×5): 240 mg via ORAL
  Filled 2013-01-03 (×5): qty 1

## 2013-01-03 MED ORDER — METHOCARBAMOL 500 MG PO TABS: 500.0000 mg | ORAL_TABLET | Freq: Four times a day (QID) | ORAL | Status: DC | PRN

## 2013-01-03 MED ORDER — ACETAMINOPHEN 10 MG/ML IV SOLN
INTRAVENOUS | Status: DC | PRN
  Administered 2013-01-03: 1000 mg via INTRAVENOUS

## 2013-01-03 MED ORDER — MIDAZOLAM HCL 5 MG/5ML IJ SOLN
INTRAMUSCULAR | Status: DC | PRN
  Administered 2013-01-03: 2 mg via INTRAVENOUS

## 2013-01-03 MED ORDER — CLINDAMYCIN PHOSPHATE 900 MG/50ML IV SOLN
900.0000 mg | INTRAVENOUS | Status: AC
  Administered 2013-01-03: 900 mg via INTRAVENOUS

## 2013-01-03 MED ORDER — ONDANSETRON HCL 4 MG/2ML IJ SOLN
INTRAMUSCULAR | Status: DC | PRN
  Administered 2013-01-03: 4 mg via INTRAVENOUS

## 2013-01-03 MED ORDER — ACETAMINOPHEN 325 MG PO TABS: 650.0000 mg | ORAL_TABLET | Freq: Four times a day (QID) | ORAL | Status: DC | PRN

## 2013-01-03 MED ORDER — ONDANSETRON HCL 4 MG/2ML IJ SOLN: 4.0000 mg | Freq: Four times a day (QID) | INTRAMUSCULAR | Status: DC | PRN

## 2013-01-03 MED ORDER — METOCLOPRAMIDE HCL 5 MG/ML IJ SOLN: 5.0000 mg | Freq: Three times a day (TID) | INTRAMUSCULAR | Status: DC | PRN

## 2013-01-03 MED ORDER — ADULT MULTIVITAMIN W/MINERALS CH
1.0000 | ORAL_TABLET | Freq: Every day | ORAL | Status: DC
  Administered 2013-01-03 – 2013-01-07 (×5): 1 via ORAL
  Filled 2013-01-03 (×5): qty 1

## 2013-01-03 MED ORDER — PROPOFOL INFUSION 10 MG/ML OPTIME
INTRAVENOUS | Status: DC | PRN
  Administered 2013-01-03: 60 ug/kg/min via INTRAVENOUS

## 2013-01-03 MED ORDER — DIPHENHYDRAMINE HCL 12.5 MG/5ML PO ELIX: 12.5000 mg | ORAL_SOLUTION | ORAL | Status: DC | PRN

## 2013-01-03 MED ORDER — EPHEDRINE SULFATE 50 MG/ML IJ SOLN
INTRAMUSCULAR | Status: DC | PRN
  Administered 2013-01-03: 10 mg via INTRAVENOUS
  Administered 2013-01-03 (×2): 5 mg via INTRAVENOUS

## 2013-01-03 MED ORDER — ONDANSETRON HCL 4 MG PO TABS: 4.0000 mg | ORAL_TABLET | Freq: Four times a day (QID) | ORAL | Status: DC | PRN

## 2013-01-03 MED ORDER — ALUM & MAG HYDROXIDE-SIMETH 200-200-20 MG/5ML PO SUSP: 30.0000 mL | ORAL | Status: DC | PRN

## 2013-01-03 MED ORDER — ASPIRIN EC 325 MG PO TBEC
325.0000 mg | DELAYED_RELEASE_TABLET | Freq: Every day | ORAL | Status: DC
  Administered 2013-01-04 – 2013-01-07 (×4): 325 mg via ORAL
  Filled 2013-01-03 (×5): qty 1

## 2013-01-03 MED ORDER — SIMVASTATIN 20 MG PO TABS
20.0000 mg | ORAL_TABLET | Freq: Every day | ORAL | Status: DC
  Filled 2013-01-03: qty 1

## 2013-01-03 MED ORDER — ZOLPIDEM TARTRATE 5 MG PO TABS: 5.0000 mg | ORAL_TABLET | Freq: Every evening | ORAL | Status: DC | PRN

## 2013-01-03 MED ORDER — CLINDAMYCIN PHOSPHATE 600 MG/50ML IV SOLN
600.0000 mg | Freq: Four times a day (QID) | INTRAVENOUS | Status: AC
  Administered 2013-01-03 (×2): 600 mg via INTRAVENOUS
  Filled 2013-01-03 (×2): qty 50

## 2013-01-03 SURGICAL SUPPLY — 43 items
ADH SKN CLS APL DERMABOND .7 (GAUZE/BANDAGES/DRESSINGS) ×1
BAG SPEC THK2 15X12 ZIP CLS (MISCELLANEOUS) ×2
BAG ZIPLOCK 12X15 (MISCELLANEOUS) ×4 IMPLANT
BLADE SAW SGTL 18X1.27X75 (BLADE) ×2 IMPLANT
CATH FOLEY LATEX FREE 16FR (CATHETERS) ×1 IMPLANT
CELLS DAT CNTRL 66122 CELL SVR (MISCELLANEOUS) ×1 IMPLANT
CLOTH BEACON ORANGE TIMEOUT ST (SAFETY) ×2 IMPLANT
DERMABOND ADVANCED (GAUZE/BANDAGES/DRESSINGS) ×1
DERMABOND ADVANCED .7 DNX12 (GAUZE/BANDAGES/DRESSINGS) ×1 IMPLANT
DRAPE C-ARM 42X72 X-RAY (DRAPES) ×2 IMPLANT
DRAPE STERI IOBAN 125X83 (DRAPES) ×2 IMPLANT
DRAPE U-SHAPE 47X51 STRL (DRAPES) ×6 IMPLANT
DRSG AQUACEL AG ADV 3.5X10 (GAUZE/BANDAGES/DRESSINGS) ×2 IMPLANT
DURAPREP 26ML APPLICATOR (WOUND CARE) ×2 IMPLANT
ELECT BLADE TIP CTD 4 INCH (ELECTRODE) ×2 IMPLANT
ELECT REM PT RETURN 9FT ADLT (ELECTROSURGICAL) ×2
ELECTRODE REM PT RTRN 9FT ADLT (ELECTROSURGICAL) ×1 IMPLANT
FACESHIELD LNG OPTICON STERILE (SAFETY) ×8 IMPLANT
GLOVE BIO SURGEON STRL SZ7 (GLOVE) ×2 IMPLANT
GLOVE BIO SURGEON STRL SZ7.5 (GLOVE) ×2 IMPLANT
GLOVE BIOGEL PI IND STRL 7.5 (GLOVE) IMPLANT
GLOVE BIOGEL PI IND STRL 8 (GLOVE) ×1 IMPLANT
GLOVE BIOGEL PI INDICATOR 7.5 (GLOVE)
GLOVE BIOGEL PI INDICATOR 8 (GLOVE) ×1
GLOVE ECLIPSE 7.0 STRL STRAW (GLOVE) ×2 IMPLANT
GOWN STRL REIN XL XLG (GOWN DISPOSABLE) ×4 IMPLANT
HANDPIECE INTERPULSE COAX TIP (DISPOSABLE) ×2
KIT BASIN OR (CUSTOM PROCEDURE TRAY) ×2 IMPLANT
PACK TOTAL JOINT (CUSTOM PROCEDURE TRAY) ×2 IMPLANT
PADDING CAST COTTON 6X4 STRL (CAST SUPPLIES) ×2 IMPLANT
RETRACTOR WND ALEXIS 18 MED (MISCELLANEOUS) ×1 IMPLANT
RTRCTR WOUND ALEXIS 18CM MED (MISCELLANEOUS) ×2
SET HNDPC FAN SPRY TIP SCT (DISPOSABLE) ×1 IMPLANT
SUT ETHIBOND NAB CT1 #1 30IN (SUTURE) ×3 IMPLANT
SUT ETHILON 4 0 PS 2 18 (SUTURE) ×1 IMPLANT
SUT MNCRL AB 4-0 PS2 18 (SUTURE) ×2 IMPLANT
SUT VIC AB 1 CT1 36 (SUTURE) ×3 IMPLANT
SUT VIC AB 2-0 CT1 27 (SUTURE) ×2
SUT VIC AB 2-0 CT1 TAPERPNT 27 (SUTURE) ×2 IMPLANT
SUT VLOC 180 0 24IN GS25 (SUTURE) ×2 IMPLANT
TOWEL OR 17X26 10 PK STRL BLUE (TOWEL DISPOSABLE) ×4 IMPLANT
TOWEL OR NON WOVEN STRL DISP B (DISPOSABLE) ×2 IMPLANT
TRAY FOLEY CATH 14FRSI W/METER (CATHETERS) ×1 IMPLANT

## 2013-01-03 NOTE — Anesthesia Preprocedure Evaluation (Addendum)
Anesthesia Evaluation  Patient identified by MRN, date of birth, ID band Patient awake    Reviewed: Allergy & Precautions, H&P , NPO status , Patient's Chart, lab work & pertinent test results  Airway Mallampati: III TM Distance: <3 FB Neck ROM: Full    Dental no notable dental hx.    Pulmonary neg pulmonary ROS,  breath sounds clear to auscultation  Pulmonary exam normal       Cardiovascular hypertension, Pt. on medications + Past MI + dysrhythmias Atrial Fibrillation Rhythm:Regular Rate:Normal     Neuro/Psych negative neurological ROS  negative psych ROS   GI/Hepatic negative GI ROS, Neg liver ROS,   Endo/Other  negative endocrine ROS  Renal/GU negative Renal ROS  negative genitourinary   Musculoskeletal negative musculoskeletal ROS (+)   Abdominal   Peds negative pediatric ROS (+)  Hematology  (+) Blood dyscrasia, anemia ,   Anesthesia Other Findings   Reproductive/Obstetrics negative OB ROS                           Anesthesia Physical Anesthesia Plan  ASA: III  Anesthesia Plan: Spinal   Post-op Pain Management:    Induction: Intravenous  Airway Management Planned: Nasal Cannula  Additional Equipment:   Intra-op Plan:   Post-operative Plan:   Informed Consent: I have reviewed the patients History and Physical, chart, labs and discussed the procedure including the risks, benefits and alternatives for the proposed anesthesia with the patient or authorized representative who has indicated his/her understanding and acceptance.   Dental advisory given  Plan Discussed with: CRNA and Surgeon  Anesthesia Plan Comments:        Anesthesia Quick Evaluation

## 2013-01-03 NOTE — Anesthesia Procedure Notes (Signed)
Spinal  Patient location during procedure: OR Staffing Performed by: anesthesiologist  Preanesthetic Checklist Completed: patient identified, site marked, surgical consent, pre-op evaluation, timeout performed, IV checked, risks and benefits discussed and monitors and equipment checked Spinal Block Patient position: sitting Prep: Betadine Patient monitoring: heart rate, continuous pulse ox and blood pressure Approach: midline Location: L3-4 Injection technique: single-shot Needle Needle type: Sprotte  Needle gauge: 24 G Needle length: 9 cm Additional Notes Expiration date of kit checked and confirmed. Patient tolerated procedure well, without complications.

## 2013-01-03 NOTE — H&P (Signed)
TOTAL HIP ADMISSION H&P  Patient is admitted for left total hip arthroplasty.  Subjective:  Chief Complaint: left hip pain  HPI: Samantha Stevenson, 62 y.o. female, has a history of pain and functional disability in the left hip(s) due to arthritis and patient has failed non-surgical conservative treatments for greater than 12 weeks to include NSAID's and/or analgesics, corticosteriod injections and activity modification.  Onset of symptoms was gradual starting 4 years ago with gradually worsening course since that time.The patient noted no past surgery on the left hip(s).  Patient currently rates pain in the left hip at 10 out of 10 with activity. Patient has night pain, worsening of pain with activity and weight bearing, pain that interfers with activities of daily living, pain with passive range of motion and crepitus. Patient has evidence of subchondral cysts, subchondral sclerosis, periarticular osteophytes and joint space narrowing by imaging studies. This condition presents safety issues increasing the risk of falls.  There is no current active infection.  Patient Active Problem List   Diagnosis Date Noted  . Degenerative arthritis of hip 01/03/2013   Past Medical History  Diagnosis Date  . Arthritis   . Hyperlipidemia   . Hypertension   . Anemia   . Myocardial infarction 2000    due to atrial fib  . Shortness of breath     OCCASIONALL WITH EXERTION  . Atrial fibrillation     SINCE AGE 15 - MANAGED WITH MEDS  . GERD (gastroesophageal reflux disease)     CONTROLED WITH DIET  . Difficulty sleeping     Past Surgical History  Procedure Laterality Date  . Partial hysterectomy    . Tonsillectomy    . Vagina reconstruction surgery      vagina had closed up when 62 years old    Prescriptions prior to admission  Medication Sig Dispense Refill  . acetaminophen (TYLENOL) 500 MG tablet Take 500 mg by mouth every 6 (six) hours as needed for pain.      Marland Kitchen aspirin EC 81 MG tablet Take  81 mg by mouth daily.      . digoxin (LANOXIN) 0.25 MG tablet Take 0.25 mg by mouth every morning.      . diltiazem (DILACOR XR) 240 MG 24 hr capsule Take 240 mg by mouth every morning.      . furosemide (LASIX) 20 MG tablet Take 20 mg by mouth daily as needed (for fluid).      . pravastatin (PRAVACHOL) 40 MG tablet Take 40 mg by mouth every morning.       . Multiple Vitamin (MULTIVITAMIN) tablet Take 1 tablet by mouth daily.       Allergies  Allergen Reactions  . Latex Hives and Rash  . Penicillins Other (See Comments)    Severe swelling  . Asa (Aspirin) Other (See Comments)    Makes stomach upset  . Celebrex (Celecoxib)     GI upset  . Nsaids     rash    History  Substance Use Topics  . Smoking status: Never Smoker   . Smokeless tobacco: Never Used  . Alcohol Use: No    Family History  Problem Relation Age of Onset  . Heart disease Father   . Diabetes Mother   . Diabetes Sister      Review of Systems  Musculoskeletal: Positive for joint pain.  All other systems reviewed and are negative.    Objective:  Physical Exam  Constitutional: She is oriented to person, place, and time.  She appears well-developed and well-nourished.  HENT:  Head: Normocephalic and atraumatic.  Eyes: EOM are normal. Pupils are equal, round, and reactive to light.  Neck: Normal range of motion. Neck supple.  Cardiovascular: Normal rate and regular rhythm.   Respiratory: Effort normal and breath sounds normal.  GI: Soft. Bowel sounds are normal.  Musculoskeletal:       Left hip: She exhibits decreased range of motion, tenderness, bony tenderness and crepitus.  Neurological: She is alert and oriented to person, place, and time.  Skin: Skin is warm and dry.  Psychiatric: She has a normal mood and affect.    Vital signs in last 24 hours: Temp:  [97.1 F (36.2 C)] 97.1 F (36.2 C) (03/14 0529) Pulse Rate:  [82] 82 (03/14 0529) Resp:  [18] 18 (03/14 0529) BP: (135)/(80) 135/80 mmHg (03/14  0529) SpO2:  [100 %] 100 % (03/14 0529)  Labs:   Estimated body mass index is 32.81 kg/(m^2) as calculated from the following:   Height as of 12/31/12: 5' (1.524 m).   Weight as of 09/10/12: 76.204 kg (168 lb).   Imaging Review Plain radiographs demonstrate severe degenerative joint disease of the left hip(s). The bone quality appears to be good for age and reported activity level.  Assessment/Plan:  End stage arthritis, left hip(s)  The patient history, physical examination, clinical judgement of the provider and imaging studies are consistent with end stage degenerative joint disease of the left hip(s) and total hip arthroplasty is deemed medically necessary. The treatment options including medical management, injection therapy, arthroscopy and arthroplasty were discussed at length. The risks and benefits of total hip arthroplasty were presented and reviewed. The risks due to aseptic loosening, infection, stiffness, dislocation/subluxation,  thromboembolic complications and other imponderables were discussed.  The patient acknowledged the explanation, agreed to proceed with the plan and consent was signed. Patient is being admitted for inpatient treatment for surgery, pain control, PT, OT, prophylactic antibiotics, VTE prophylaxis, progressive ambulation and ADL's and discharge planning.The patient is planning to be discharged home with home health services

## 2013-01-03 NOTE — Transfer of Care (Signed)
Immediate Anesthesia Transfer of Care Note  Patient: Samantha Stevenson  Procedure(s) Performed: Procedure(s) with comments: TOTAL HIP ARTHROPLASTY ANTERIOR APPROACH (Left) - Left Total Hip Arthroplasty, Anterior Approach  Patient Location: PACU  Anesthesia Type:Spinal  Level of Consciousness: sedated  Airway & Oxygen Therapy: Patient Spontanous Breathing and Patient connected to face mask oxygen  Post-op Assessment: Report given to PACU RN and Post -op Vital signs reviewed and stable  Post vital signs: Reviewed and stable  Complications: No apparent anesthesia complications

## 2013-01-03 NOTE — Progress Notes (Signed)
Utilization review completed.  

## 2013-01-03 NOTE — Care Management Note (Addendum)
    Page 1 of 2   01/06/2013     3:22:04 PM   CARE MANAGEMENT NOTE 01/06/2013  Patient:  [facility]   Account Number:  [id number]  Date Initiated:  01/03/2013  Documentation initiated by:  Colleen Can  Subjective/Objective Assessment:   dx total hip replacemnent-anterior approach     Action/Plan:   Plans home with daughter as care giver, Will need DME. PT is pending.  List of [facility] agencies given for review   Anticipated DC Date:  01/06/2013   Anticipated DC Plan:  HOME W HOME HEALTH SERVICES      DC Planning Services  CM consult      PAC Choice  DURABLE MEDICAL EQUIPMENT  HOME HEALTH   Choice offered to / List presented to:  C-1 Patient   DME arranged  3-N-1  [facility]      DME agency  [facility]     HH arranged  HH-2 PT      HH agency  [facility]   Status of service:  Completed, signed off Medicare Important Message given?   (If response is "NO", the following Medicare IM given date fields will be blank) Date Medicare IM given:   Date Additional Medicare IM given:    Discharge Disposition:  HOME W HOME HEALTH SERVICES  Per UR Regulation:  Reviewed for med. necessity/level of care/duration of stay  If discussed at Long Length of Stay Meetings, dates discussed:    Comments:  01-06-13 Lorenda Ishihara RN CM 1400 [facility] unable to provide services. Contacted [facility], they can provide services starting 3/20. Patient informed, PT will provide interim excercises that patient can perform. RN to notify attending of the start of service delay.  1000 Spoke with patient at bedside, has chosen Transport planner for [employer]. [facility] is unable to staff for PT until late in the week. Contacted [facility] and they will take patient, faxed face sheet and orders to [phone]. Awaiting call back for start of service availability. Contacted [facility] to arrange delivery of DME, RW will be delivered today to patient's room. 3-in-1 is out of stock but will  be available for pick up tomorrow. Patient aware and agrees.  01/04/13 1429 Tymeeka Davis,RN,BSN [phone] Cm spoke with patient concerning discharge planning. MD order for HHPT. Pt states lives with adult daughter who assist in home care. Pt offered choice for [facility]. Pt informed [facility] & [facility] only two agencies within patient's network. No choice made at this time. Pt instructed to contact CM prior to discharge to ensure appropriate [facility] services arranged.

## 2013-01-03 NOTE — Brief Op Note (Signed)
01/03/2013  9:21 AM  PATIENT:  Samantha Stevenson  62 y.o. female  PRE-OPERATIVE DIAGNOSIS:  Severe osteoarthritis left hip  POST-OPERATIVE DIAGNOSIS:  Severe osteoarthritis left hip  PROCEDURE:  Procedure(s) with comments: TOTAL HIP ARTHROPLASTY ANTERIOR APPROACH (Left) - Left Total Hip Arthroplasty, Anterior Approach  SURGEON:  Surgeon(s) and Role:    * Kathryne Hitch, MD - Primary  PHYSICIAN ASSISTANT:  Rexene Edison, PA-C  ANESTHESIA:   spinal  EBL:  Total I/O In: 2000 [I.V.:2000] Out: 400 [Urine:100; Blood:300]  BLOOD ADMINISTERED:none  DRAINS: none   LOCAL MEDICATIONS USED:  NONE  SPECIMEN:  No Specimen  DISPOSITION OF SPECIMEN:  N/A  COUNTS:  YES  TOURNIQUET:  * No tourniquets in log *  DICTATION: .Other Dictation: Dictation Number [id number]  PLAN OF CARE: Admit to inpatient   PATIENT DISPOSITION:  PACU - hemodynamically stable.   Delay start of Pharmacological VTE agent (>24hrs) due to surgical blood loss or risk of bleeding: no

## 2013-01-03 NOTE — Evaluation (Signed)
Physical Therapy Evaluation Patient Details Name: Samantha Stevenson MRN: [id number] DOB: 05/19/1951 Today's Date: 01/03/2013 Time: 5643-3295 PT Time Calculation (min): 30 min  PT Assessment / Plan / Recommendation Clinical Impression  Pt s/p L THR presents with decreased L LE strength/ROM and post op pain limiting functional mobility    PT Assessment  Patient needs continued PT services    Follow Up Recommendations  Home health PT    Does the patient have the potential to tolerate intense rehabilitation      Barriers to Discharge None      Equipment Recommendations  Rolling walker with 5" wheels    Recommendations for Other Services OT consult   Frequency 7X/week    Precautions / Restrictions Precautions Precautions: Fall Restrictions Weight Bearing Restrictions: No Other Position/Activity Restrictions: WBAT   Pertinent Vitals/Pain 5/10; premed; ice pack provided      Mobility  Bed Mobility Bed Mobility: Supine to Sit;Sit to Supine Supine to Sit: 1: +2 Total assist Supine to Sit: Patient Percentage: 50% Sit to Supine: 1: +2 Total assist Sit to Supine: Patient Percentage: 40% Details for Bed Mobility Assistance: cues for sequence and use of UEs and R LE to self assist Transfers Transfers: Sit to Stand;Stand to Sit Sit to Stand: 1: +2 Total assist Sit to Stand: Patient Percentage: 70% Stand to Sit: 1: +2 Total assist Stand to Sit: Patient Percentage: 60% Details for Transfer Assistance: cues for LE management and use of UEs to self assist Ambulation/Gait Ambulation/Gait Assistance: 1: +2 Total assist Ambulation/Gait: Patient Percentage: 70% Ambulation Distance (Feet): 7 Feet Assistive device: Rolling walker Ambulation/Gait Assistance Details: cues for sequence, posture, stride length and position from RW Gait Pattern: Step-to pattern;Decreased step length - right;Decreased step length - left    Exercises Total Joint Exercises Ankle Circles/Pumps: AROM;15  reps;Supine;Both Quad Sets: AROM;10 reps;Left;Supine Heel Slides: AAROM;10 reps;Supine;Left Hip ABduction/ADduction: AAROM;10 reps;Supine;Left   PT Diagnosis: Difficulty walking  PT Problem List: Decreased strength;Decreased range of motion;Decreased activity tolerance;Decreased mobility;Pain;Decreased knowledge of use of DME PT Treatment Interventions: DME instruction;Gait training;Stair training;Functional mobility training;Therapeutic activities;Therapeutic exercise;Patient/family education   PT Goals Acute Rehab PT Goals PT Goal Formulation: With patient Time For Goal Achievement: 01/08/13 Potential to Achieve Goals: Good Pt will go Supine/Side to Sit: with supervision PT Goal: Supine/Side to Sit - Progress: Goal set today Pt will go Sit to Supine/Side: with supervision PT Goal: Sit to Supine/Side - Progress: Goal set today Pt will go Sit to Stand: with supervision PT Goal: Sit to Stand - Progress: Goal set today Pt will go Stand to Sit: with supervision PT Goal: Stand to Sit - Progress: Goal set today Pt will Ambulate: 51 - 150 feet;with supervision;with rolling walker PT Goal: Ambulate - Progress: Goal set today Pt will Go Up / Down Stairs: Flight;with min assist;with least restrictive assistive device PT Goal: Up/Down Stairs - Progress: Goal set today  Visit Information  Last PT Received On: 01/03/13 Assistance Needed: +2    Subjective Data  Subjective: I'm ready to get up and try Patient Stated Goal: Resume previous lifestyle with decreased pain   Prior Functioning  Home Living Lives With: Family Available Help at Discharge: Family Type of Home: House Home Access: Stairs to enter Secretary/administrator of Steps: 10 Entrance Stairs-Rails: Right;Left;Can reach both Home Layout: One level Home Adaptive Equipment: Straight cane Prior Function Level of Independence: Independent;Independent with assistive device(s) Able to Take Stairs?: Yes Driving:  Yes Communication Communication: No difficulties    Cognition  Cognition Overall  Cognitive Status: Appears within functional limits for tasks assessed/performed Arousal/Alertness: Awake/alert Orientation Level: Appears intact for tasks assessed Behavior During Session: [facility] for tasks performed    Extremity/Trunk Assessment Right Upper Extremity Assessment RUE ROM/Strength/Tone: [facility] for tasks assessed Left Upper Extremity Assessment LUE ROM/Strength/Tone: WFL for tasks assessed Right Lower Extremity Assessment RLE ROM/Strength/Tone: [facility] for tasks assessed Left Lower Extremity Assessment LLE ROM/Strength/Tone: Deficits LLE ROM/Strength/Tone Deficits: Hip strength 2+ /5 with AAROm to 85 flex and 10 abd    Balance    End of Session PT - End of Session Equipment Utilized During Treatment: Gait belt Activity Tolerance: Patient tolerated treatment well Patient left: in bed;with call bell/phone within reach;with family/visitor present Nurse Communication: Mobility status  GP     Jessina Marse 01/03/2013, 4:21 PM

## 2013-01-03 NOTE — Anesthesia Postprocedure Evaluation (Signed)
  Anesthesia Post-op Note  Patient: Samantha Stevenson  Procedure(s) Performed: Procedure(s) (LRB): TOTAL HIP ARTHROPLASTY ANTERIOR APPROACH (Left)  Patient Location: PACU  Anesthesia Type: Spinal  Level of Consciousness: awake and alert   Airway and Oxygen Therapy: Patient Spontanous Breathing  Post-op Pain: mild  Post-op Assessment: Post-op Vital signs reviewed, Patient's Cardiovascular Status Stable, Respiratory Function Stable, Patent Airway and No signs of Nausea or vomiting  Last Vitals:  Filed Vitals:   01/03/13 0930  BP: 103/53  Pulse: 61  Temp:   Resp:     Post-op Vital Signs: stable   Complications: No apparent anesthesia complications

## 2013-01-03 NOTE — Op Note (Signed)
NAMEKARREN, NEWLAND          ACCOUNT NO.:  [id number]  MEDICAL RECORD NO.:  [id number]  LOCATION:  1603                         FACILITY:  [facility]  PHYSICIAN:  Vanita Panda. Magnus Ivan, M.D.DATE OF BIRTH:  1951/05/05  DATE OF PROCEDURE:  01/03/2013 DATE OF DISCHARGE:                              OPERATIVE REPORT   PREOPERATIVE DIAGNOSIS:  Severe end-stage arthritis and degenerative joint disease, left hip.  POSTOPERATIVE DIAGNOSIS:  Severe end-stage arthritis and degenerative joint disease, left hip.  PROCEDURE:  Left total hip arthroplasty through direct anterior approach.  IMPLANTS:  Size 50, DePuy Gription Sector acetabular component, size 32+4 neutral polyethylene liner, size 8 Corail femoral component with standard offset (KA), size 32+1 ceramic hip ball.  SURGEON:  Doneen Poisson, MD.  ASSISTANT:  Rexene Edison, PA-C.  ANESTHESIA:  Spinal.  ESTIMATED BLOOD LOSS:  Less than 500 mL.  ANTIBIOTICS:  IV clindamycin 100 mg.  COMPLICATIONS:  None.  INDICATIONS:  This patient is a very pleasant 62 year old female with severe arthritis of her left hip.  She has tried every different modality conservatively to get her hip feeling better, but just not doing well.  Her activities of daily living is greatly diminished.  Her pain is severe.  Her mobility is limited due to this.  We reviewed her x- rays that showed complete loss of the joint space in the left hip with subchondral sclerosis, sclerotic changes, and periarticular osteophytes. At this point, given the failure of conservative treatment, she wished to proceed with total hip arthroplasty.  She understands the risk of acute blood loss anemia, infection, fracture, DVT with the goals of decreased pain and improved mobility as well as improved quality of life.  PROCEDURE DESCRIPTION:  After informed consent was obtained, appropriate left hip was marked.  She was brought to the operating room, where spinal  anesthesia was obtained while she was on her stretcher.  A Foley catheter was placed and then traction boots were placed on both her feet.  She was then placed supine on the Hana fracture table.  The perineal post in place and both legs in the traction devices, but no traction applied.  Her left hip was then prepped and draped with DuraPrep and sterile drapes.  Time-out was called to identify correct patient and correct left hip.  We then made an incision just posterior- inferior to the anterior-superior iliac spine and carried this obliquely down the leg.  I then dissected down to the tensor fascia lata and the tensor fascia was divided longitudinally.  I then proceeded with a direct anterior approach to the hip.  At that point, a Cobra retractor placed around the lateral neck and up underneath the rectus femoris, a medial retractor was placed.  We cauterized the lateral femoral circumflex vessels and then opened the hip capsule in an L-shaped format placing the Cobra retractors within the hip capsule.  I then made my femoral neck cut just proximal to the lesser trochanter with an oscillating saw and completed this with an osteotome and removed the femoral head in its entirety and found to be completely devoid of cartilage.  We cleaned the acetabular debris including releasing the transverse carpal ligament and removing remnants of the labrum.  I then began reaming from a size 42 reamer in 2 mm increments up to a size 50 with old reamers placed under direct visualization and the last reamer placed under direct fluoroscopy.  We then placed the real DePuy Sector Gription acetabular component size 50, and verified displacement under fluoroscopy as well as we could obtain our inclination abduction and anteversion.  I then placed the apex hole eliminator guide and the real 32+4 neutral polyethylene liner.  Attention was then turned to the femur.  With no traction applied.  The leg was  externally rotated to 90 degrees, extended and adducted, placed a similar retractor medially and Hohmann retractor behind the greater trochanter.  I released the lateral hip capsule in the piriformis to bring the leg out.  I then used a rongeur and a box cutting guide to open up the femoral canal.  The only broach that we were able to place down is a size 8 broach which was due to her having such a narrow canal.  We trialed a standard neck and a 32+1 hip ball.  We brought the leg back up and over with traction and internal rotation, reduced this into the acetabulum.  She was quite [place] preoperatively and postoperatively.  We got the leg lengths more appropriately equal.  We then dislocated the hip, placed a real Corail femoral component size 8 with standard offset and the real 32+4 neutral polyethylene liner.  We then reduced the hip again and copiously irrigated the soft tissues normal saline solution and pulsatile lavage. We closed the joint capsule with interrupted #1 Ethibond suture followed by #0 V-Loc in the tensor fascia, 2-0 Vicryl in the deep and subcutaneous tissue, 4-0 Monocryl subcuticular stitch and Dermabond on the skin.  A well-padded Silvercel dressing was applied.  She was taken off the Hana table to the recovery in stable condition.  All final counts correct.  There were no complications noted.  Of note, Rexene Edison, PA-C, was present the entire case and his assistance was important with the patient's positioning, exposure to the hip joint, placement of implants as well as closure of the wound.     Vanita Panda. Magnus Ivan, M.D.     CYB/MEDQ  D:  01/03/2013  T:  01/03/2013  Job:  [id number]

## 2013-01-04 LAB — BASIC METABOLIC PANEL
BUN: 12 mg/dL (ref 6–23)
CO2: 27 mEq/L (ref 19–32)
Calcium: 8.4 mg/dL (ref 8.4–10.5)
Chloride: 100 mEq/L (ref 96–112)
Creatinine, Ser: 0.62 mg/dL (ref 0.50–1.10)
GFR calc Af Amer: >90 mL/min (ref >90)
GFR calc non Af Amer: >90 mL/min (ref >90)
Glucose, Bld: 134 mg/dL — ABNORMAL HIGH (ref 70–99)
Potassium: 4.2 mEq/L (ref 3.5–5.1)
Sodium: 135 mEq/L (ref 135–145)

## 2013-01-04 LAB — CBC
HCT: 27.7 % — ABNORMAL LOW (ref 36.0–46.0)
Hemoglobin: 8.9 g/dL — ABNORMAL LOW (ref 12.0–15.0)
MCH: 27.2 pg (ref 26.0–34.0)
MCHC: 32.1 g/dL (ref 30.0–36.0)
MCV: 84.7 fL (ref 78.0–100.0)
Platelets: 346 10*3/uL (ref 150–400)
RBC: 3.27 MIL/uL — ABNORMAL LOW (ref 3.87–5.11)
RDW: 15 % (ref 11.5–15.5)
WBC: 12.8 10*3/uL — ABNORMAL HIGH (ref 4.0–10.5)

## 2013-01-04 NOTE — Progress Notes (Signed)
Physical Therapy Treatment Patient Details Name: Samantha Stevenson MRN: [id number] DOB: 06/02/1951 Today's Date: 01/04/2013 Time: 4098-1191 PT Time Calculation (min): 32 min  PT Assessment / Plan / Recommendation Comments on Treatment Session  Pt continues to do well with mobility and demonstrates improved bed mobility today.     Follow Up Recommendations  Home health PT     Does the patient have the potential to tolerate intense rehabilitation     Barriers to Discharge        Equipment Recommendations  Rolling walker with 5" wheels    Recommendations for Other Services    Frequency 7X/week   Plan Discharge plan remains appropriate    Precautions / Restrictions Precautions Precautions: Fall Restrictions Weight Bearing Restrictions: No Other Position/Activity Restrictions: WBAT   Pertinent Vitals/Pain 4/10 pain, ice pack applied    Mobility  Bed Mobility Bed Mobility: Supine to Sit;Sit to Supine Supine to Sit: 4: Min assist;HOB elevated;With rails Sit to Supine: 4: Min assist Details for Bed Mobility Assistance: Assist for LLE into and out of bed with cues for hand placement and technique.  Pt demos better bed mobilit this pm.  Transfers Transfers: Sit to Stand;Stand to Sit Sit to Stand: 4: Min assist;From elevated surface;From bed;From chair/3-in-1 Stand to Sit: 4: Min assist;With upper extremity assist;With armrests;To chair/3-in-1;To bed Details for Transfer Assistance: Performed x 2 in order to use 3in1 in restroom with cues for hand placement and LE management.  Ambulation/Gait Ambulation/Gait Assistance: 4: Min assist;4: Min Government social research officer (Feet): 130 Feet Assistive device: Rolling walker Ambulation/Gait Assistance Details: Min cues for upright and relaxed posture and correct sequence as pt tends to step too far inside RW.  Gait Pattern: Step-to pattern;Decreased step length - right;Decreased step length - left    Exercises Total Joint  Exercises Ankle Circles/Pumps: AROM;Both;20 reps Quad Sets: AROM;10 reps;Left;Supine Heel Slides: AAROM;10 reps;Supine;Left Hip ABduction/ADduction: AAROM;10 reps;Supine;Left   PT Diagnosis:    PT Problem List:   PT Treatment Interventions:     PT Goals Acute Rehab PT Goals PT Goal Formulation: With patient Time For Goal Achievement: 01/08/13 Potential to Achieve Goals: Good Pt will go Supine/Side to Sit: with supervision PT Goal: Supine/Side to Sit - Progress: Progressing toward goal Pt will go Sit to Supine/Side: with supervision PT Goal: Sit to Supine/Side - Progress: Progressing toward goal Pt will go Sit to Stand: with supervision PT Goal: Sit to Stand - Progress: Progressing toward goal Pt will go Stand to Sit: with supervision PT Goal: Stand to Sit - Progress: Progressing toward goal Pt will Ambulate: 51 - 150 feet;with supervision;with rolling walker PT Goal: Ambulate - Progress: Progressing toward goal  Visit Information  Last PT Received On: 01/04/13 Assistance Needed: +1    Subjective Data  Subjective: I'm ready.  Patient Stated Goal: Resume previous lifestyle with decreased pain   Cognition  Cognition Overall Cognitive Status: Appears within functional limits for tasks assessed/performed Arousal/Alertness: Awake/alert Orientation Level: Appears intact for tasks assessed Behavior During Session: [facility] for tasks performed    Balance     End of Session PT - End of Session Activity Tolerance: Patient tolerated treatment well Patient left: in bed;with call bell/phone within reach;with family/visitor present Nurse Communication: Mobility status   GP     Vista Deck 01/04/2013, 5:14 PM

## 2013-01-04 NOTE — Care Management (Addendum)
Cm spoke with patient concerning discharge planning. MD order for HHPT. Pt states lives with adult daughter who assist in home care. Pt offered choice for [facility]. Pt advised of agencies within payor's network: [facility] or [facility]. No choice made at this time. Pt instructed to contact CM prior to discharge to ensure appropriate [facility] services arranged.   Roxy Manns Jeriah Skufca,RN,BSN [phone]

## 2013-01-04 NOTE — Progress Notes (Signed)
Patient ID: Samantha Stevenson, female   DOB: 06-01-1951, 62 y.o.   MRN: [id number] Patient is postoperative day 1 total hip arthroplasty. She is comfortable. Neurovascularly intact. Hemoglobin 8.9. Continue physical therapy weightbearing as tolerated.

## 2013-01-04 NOTE — Progress Notes (Signed)
Physical Therapy Treatment Patient Details Name: Samantha Stevenson MRN: [id number] DOB: 1951-05-09 Today's Date: 01/04/2013 Time: 4098-1191 PT Time Calculation (min): 21 min  PT Assessment / Plan / Recommendation Comments on Treatment Session  Pt progressing well with ambulation with very little increase in pain with ambulation.     Follow Up Recommendations  Home health PT     Does the patient have the potential to tolerate intense rehabilitation     Barriers to Discharge        Equipment Recommendations  Rolling walker with 5" wheels    Recommendations for Other Services OT consult  Frequency 7X/week   Plan Discharge plan remains appropriate    Precautions / Restrictions Precautions Precautions: Fall Restrictions Weight Bearing Restrictions: No Other Position/Activity Restrictions: WBAT   Pertinent Vitals/Pain 3/10 pain, premedicated just prior to session.     Mobility  Bed Mobility Bed Mobility: Supine to Sit Supine to Sit: 4: Min assist;HOB elevated;With rails Details for Bed Mobility Assistance: Assist for LLE out of bed with min cues for hand placement to self assist.  Transfers Transfers: Sit to Stand;Stand to Sit Sit to Stand: 4: Min assist;From elevated surface;From bed Stand to Sit: 4: Min assist;With upper extremity assist;With armrests;To chair/3-in-1 Details for Transfer Assistance: cues for hand placement and LE management when sitting/standing.  Ambulation/Gait Ambulation/Gait Assistance: 4: Min assist Ambulation Distance (Feet): 60 Feet Assistive device: Rolling walker Ambulation/Gait Assistance Details: Verbal and manual cuing for sequencing/technique with RW and mantaining upright and relaxed posture.  Gait Pattern: Step-to pattern;Decreased step length - right;Decreased step length - left    Exercises     PT Diagnosis:    PT Problem List:   PT Treatment Interventions:     PT Goals Acute Rehab PT Goals PT Goal Formulation: With patient Time  For Goal Achievement: 01/08/13 Potential to Achieve Goals: Good Pt will go Supine/Side to Sit: with supervision PT Goal: Supine/Side to Sit - Progress: Progressing toward goal Pt will go Sit to Stand: with supervision PT Goal: Sit to Stand - Progress: Progressing toward goal Pt will go Stand to Sit: with supervision PT Goal: Stand to Sit - Progress: Progressing toward goal Pt will Ambulate: 51 - 150 feet;with supervision;with rolling walker PT Goal: Ambulate - Progress: Progressing toward goal  Visit Information  Last PT Received On: 01/04/13 Assistance Needed: +2    Subjective Data  Subjective: I'm doing better today than yesterday.  Patient Stated Goal: Resume previous lifestyle with decreased pain   Cognition  Cognition Overall Cognitive Status: Appears within functional limits for tasks assessed/performed Arousal/Alertness: Awake/alert Orientation Level: Appears intact for tasks assessed Behavior During Session: [facility] for tasks performed    Balance     End of Session PT - End of Session Activity Tolerance: Patient tolerated treatment well Patient left: in chair;with call bell/phone within reach Nurse Communication: Mobility status   GP     Vista Deck 01/04/2013, 9:07 AM

## 2013-01-05 LAB — CBC
HCT: 25 % — ABNORMAL LOW (ref 36.0–46.0)
Hemoglobin: 8.2 g/dL — ABNORMAL LOW (ref 12.0–15.0)
MCH: 28.1 pg (ref 26.0–34.0)
MCHC: 32.8 g/dL (ref 30.0–36.0)
MCV: 85.6 fL (ref 78.0–100.0)
Platelets: 298 10*3/uL (ref 150–400)
RBC: 2.92 MIL/uL — ABNORMAL LOW (ref 3.87–5.11)
RDW: 15.3 % (ref 11.5–15.5)
WBC: 13 10*3/uL — ABNORMAL HIGH (ref 4.0–10.5)

## 2013-01-05 MED ORDER — ASPIRIN 325 MG PO TBEC: 325.0000 mg | DELAYED_RELEASE_TABLET | Freq: Every day | ORAL | Status: DC

## 2013-01-05 MED ORDER — FERROUS SULFATE 325 (65 FE) MG PO TABS: 325.0000 mg | ORAL_TABLET | Freq: Three times a day (TID) | ORAL | Status: DC

## 2013-01-05 MED ORDER — OXYCODONE-ACETAMINOPHEN 5-325 MG PO TABS: 1.0000 | ORAL_TABLET | ORAL | Status: DC | PRN

## 2013-01-05 MED ORDER — METHOCARBAMOL 500 MG PO TABS: 500.0000 mg | ORAL_TABLET | Freq: Four times a day (QID) | ORAL | Status: DC | PRN

## 2013-01-05 NOTE — Progress Notes (Signed)
Physical Therapy Treatment Patient Details Name: Samantha Stevenson MRN: [id number] DOB: 21-Aug-1951 Today's Date: 01/05/2013 Time: 1308-6578 PT Time Calculation (min): 24 min  PT Assessment / Plan / Recommendation Comments on Treatment Session  Pt progressing well with all mobility.  Will initiate stair training this afternoon.     Follow Up Recommendations  Home health PT     Does the patient have the potential to tolerate intense rehabilitation     Barriers to Discharge        Equipment Recommendations  Rolling walker with 5" wheels    Recommendations for Other Services    Frequency 7X/week   Plan Discharge plan remains appropriate    Precautions / Restrictions Precautions Precautions: Fall Restrictions Weight Bearing Restrictions: No Other Position/Activity Restrictions: WBAT   Pertinent Vitals/Pain 4/10 pain, ice packs applied    Mobility  Bed Mobility Bed Mobility: Sit to Supine Sit to Supine: 4: Min assist Details for Bed Mobility Assistance: Assist for LLE into bed.  Pt does well with UE and adjusting hips in bed.  Transfers Transfers: Sit to Stand;Stand to Sit Sit to Stand: 4: Min guard;With upper extremity assist;With armrests;From chair/3-in-1 Stand to Sit: 5: Supervision;With upper extremity assist;To bed Details for Transfer Assistance: Min/guard for safety when standing with min cues for hand placement.  Ambulation/Gait Ambulation/Gait Assistance: 5: Supervision Ambulation Distance (Feet): 120 Feet Assistive device: Rolling walker Ambulation/Gait Assistance Details: Min cues for upright and relaxed posture.  Pt almost able to perform step through gait pattern.  Gait Pattern: Step-to pattern;Decreased step length - right;Decreased step length - left    Exercises Total Joint Exercises Ankle Circles/Pumps: AROM;Both;20 reps Quad Sets: AROM;10 reps;Left;Supine Short Arc QuadBarbaraann Stevenson;Left;10 reps Heel Slides: AAROM;10 reps;Supine;Left Hip  ABduction/ADduction: AAROM;10 reps;Supine;Left   PT Diagnosis:    PT Problem List:   PT Treatment Interventions:     PT Goals Acute Rehab PT Goals PT Goal Formulation: With patient Time For Goal Achievement: 01/08/13 Potential to Achieve Goals: Good Pt will go Supine/Side to Sit: with supervision PT Goal: Supine/Side to Sit - Progress: Progressing toward goal Pt will go Sit to Stand: with supervision PT Goal: Sit to Stand - Progress: Progressing toward goal Pt will go Stand to Sit: with modified independence PT Goal: Stand to Sit - Progress: Updated due to goals met Pt will Ambulate: 51 - 150 feet;with rolling walker;with modified independence PT Goal: Ambulate - Progress: Updated due to goal met  Visit Information  Last PT Received On: 01/05/13 Assistance Needed: +1    Subjective Data  Subjective: What are we doing today.  Patient Stated Goal: Resume previous lifestyle with decreased pain   Cognition  Cognition Overall Cognitive Status: Appears within functional limits for tasks assessed/performed Arousal/Alertness: Awake/alert Orientation Level: Appears intact for tasks assessed Behavior During Session: [facility] for tasks performed    Balance     End of Session PT - End of Session Activity Tolerance: Patient tolerated treatment well Patient left: in bed;with call bell/phone within reach;with family/visitor present Nurse Communication: Mobility status   GP     Vista Deck 01/05/2013, 9:57 AM

## 2013-01-05 NOTE — Progress Notes (Signed)
Patient ID: Samantha Stevenson, female   DOB: [birth year], 62 y.o.   MRN: [id number] Postoperative day 2 total hip arthroplasty. Hemoglobin 8.2. Discontinue IV. Patient anticipates discharge to home on Monday.

## 2013-01-05 NOTE — Clinical Documentation Improvement (Signed)
     Anemia Blood Loss Clarification  THIS DOCUMENT IS NOT A PERMANENT PART OF THE MEDICAL RECORD  RESPOND TO THE THIS QUERY, FOLLOW THE INSTRUCTIONS BELOW:  1. If needed, update documentation for the patient's encounter via the notes activity.  2. Access this query again and click edit on the In [employer].  3. After updating, or not, click F2 to complete all highlighted (required) fields concerning your review. Select "additional documentation in the medical record" OR "no additional documentation provided".  4. Click Sign note button.  5. The deficiency will fall out of your In Basket *Please let us know if you are not able to complete this workflow by phone or e-mail (listed below).        01/05/13  Dear Dr. Magnus Ivan, Salena Saner Marton Redwood  In an effort to better capture your patient's severity of illness, reflect appropriate length of stay and utilization of resources, a review of the patient medical record has revealed the following indicators.    Based on your clinical judgment, please clarify and document in a progress note and/or discharge summary the clinical condition associated with the following supporting information:  In responding to this query please exercise your independent judgment.  The fact that a query is asked, does not imply that any particular answer is desired or expected.  In this pt admitted for L THA a review of the medical record reveal the following:   EBL= 300 per Op note 01/03/13  Post Op H/H=8.2/25.0  Clarification Needed   Please clarify if the Post Op H/H can be further specified as one of the diagnosis listed below and document in the pn and  d/c summary.     Possible Clinical Conditions?   " Expected Acute Blood Loss Anemia  " Acute Blood Loss Anemia  " Acute on chronic blood loss anemia   " Other Condition________________  " Cannot Clinically Determine  Risk Factors: (recent surgery, pre op anemia, EBL in OR)  Supporting  Information:  Signs and Symptoms (unable to ambulate, weakness, dizziness, unable to participate in care)  Diagnostics: Component     Latest Ref Rng 01/04/2013 01/05/2013  Hemoglobin     12.0 - 15.0 g/dL 8.9 (L) 8.2 (L)  HCT     36.0 - 46.0 % 27.7 (L) 25.0 (L)   Treatments:  ferrous sulfate tablet 325 mg   Reviewed: additional documentation in the medical record  Thank You,  Enis Slipper  RN, BSN, MSN/Inf, CCDS Clinical Documentation Specialist [facility] Dept Pager: [phone] / E-mail: [email or web address]  Health Information Management [place]

## 2013-01-05 NOTE — Evaluation (Signed)
Occupational Therapy Evaluation Patient Details Name: Samantha Stevenson MRN: [id number] DOB: [birth year] Today's Date: 01/05/2013 Time: 4098-1191 OT Time Calculation (min): 35 min  OT Assessment / Plan / Recommendation Clinical Impression  Pt admitted for elective left hip replacement.  Pt currently at a supervision to min assist level for selfcare tasks.  Will benefit from acute care OT for one more visit to further increase independence and efficiency with techniques, AE, and DME.  Pt will need a 3:1 for discharge.  No follow-up post acute OT recommended.    OT Assessment  Patient needs continued OT Services    Follow Up Recommendations  No OT follow up    Barriers to Discharge None    Equipment Recommendations  3 in 1 bedside comode       Frequency  Min 2X/week    Precautions / Restrictions Precautions Precautions: Fall Restrictions Weight Bearing Restrictions: No Other Position/Activity Restrictions: WBAT   Pertinent Vitals/Pain Pain 8/10 in the left hip per her report    ADL  Eating/Feeding: Simulated;Independent Where Assessed - Eating/Feeding: Chair Grooming: Simulated;Supervision/safety Where Assessed - Grooming: Supported standing Upper Body Bathing: Simulated;Set up Where Assessed - Upper Body Bathing: Unsupported sitting Lower Body Bathing: Simulated;Minimal assistance Where Assessed - Lower Body Bathing: Supported sit to stand Upper Body Dressing: Simulated;Set up Where Assessed - Upper Body Dressing: Unsupported sitting Lower Body Dressing: Simulated;Moderate assistance Where Assessed - Lower Body Dressing: Supported sit to stand Toilet Transfer: Research scientist (life sciences) Method: Other (comment) (ambulating with RW) Acupuncturist: Bedside commode Toileting - Clothing Manipulation and Hygiene: Performed;Supervision/safety Where Assessed - Toileting Clothing Manipulation and Hygiene: Sit to stand from 3-in-1 or  toilet Tub/Shower Transfer Method: Not assessed Equipment Used: Rolling walker Transfers/Ambulation Related to ADLs: Pt was overall supervision for mobility using the RW. ADL Comments: Educated pt on use of AE use for LB selfcare secondary to pt not being able to reach her feet for dressing tasks.    OT Diagnosis: Generalized weakness;Acute pain  OT Problem List: Decreased strength;Impaired balance (sitting and/or standing);Decreased knowledge of use of DME or AE;Pain OT Treatment Interventions: Self-care/ADL training;DME and/or AE instruction;Balance training;Therapeutic activities   OT Goals Acute Rehab OT Goals OT Goal Formulation: With patient Time For Goal Achievement: 01/12/13 Potential to Achieve Goals: Good ADL Goals Pt Will Perform Tub/Shower Transfer: Shower transfer;with supervision;with DME;Ambulation ADL Goal: Tub/Shower Transfer - Progress: Goal set today  Visit Information  Last OT Received On: 01/05/13 Assistance Needed: +1    Subjective Data  Subjective: I will have some help from my daughter. Patient Stated Goal: To get back to walking without a walker.   Prior Functioning     Home Living Lives With: Family Available Help at Discharge: Family Type of Home: House Home Access: Stairs to enter Secretary/administrator of Steps: 10 Entrance Stairs-Rails: Right;Left;Can reach both Home Layout: One level Bathroom Shower/Tub: Health visitor: Standard Bathroom Accessibility: Yes Home Adaptive Equipment: Straight cane Prior Function Level of Independence: Independent with assistive device(s) Able to Take Stairs?: Yes Driving: Yes Communication Communication: No difficulties Dominant Hand: Right         Vision/Perception Vision - History Baseline Vision: Wears glasses all the time Patient Visual Report: No change from baseline Vision - Assessment Vision Assessment: Vision not tested Perception Perception: Within Functional  Limits Praxis Praxis: Intact   Cognition  Cognition Overall Cognitive Status: Appears within functional limits for tasks assessed/performed Arousal/Alertness: Awake/alert Orientation Level: Appears intact for tasks assessed Behavior During Session: [facility] for  tasks performed    Extremity/Trunk Assessment Right Upper Extremity Assessment RUE ROM/Strength/Tone: Within functional levels RUE Sensation: WFL - Light Touch RUE Coordination: WFL - gross/fine motor Left Upper Extremity Assessment LUE ROM/Strength/Tone: Within functional levels LUE Sensation: WFL - Light Touch LUE Coordination: WFL - gross/fine motor Trunk Assessment Trunk Assessment: Normal     Mobility Bed Mobility Bed Mobility: Supine to Sit Supine to Sit: 4: Min assist;HOB flat Sit to Supine: 4: Min assist Details for Bed Mobility Assistance: Pt required assistance with lifting the LLE over to the side of the bed. Transfers Transfers: Sit to Stand Sit to Stand: 5: Supervision;With upper extremity assist;From elevated surface Stand to Sit: 5: Supervision;With upper extremity assist Details for Transfer Assistance: Min/guard for safety when standing with min cues for hand placement.      Exercise Total Joint Exercises Ankle Circles/Pumps: AROM;Both;20 reps Quad Sets: AROM;10 reps;Left;Supine Short Arc QuadBarbaraann Boys;Left;10 reps Heel Slides: AAROM;10 reps;Supine;Left Hip ABduction/ADduction: AAROM;10 reps;Supine;Left   Balance Balance Balance Assessed: Yes Dynamic Standing Balance Dynamic Standing - Balance Support: Right upper extremity supported;Left upper extremity supported Dynamic Standing - Level of Assistance: 5: Stand by assistance   End of Session OT - End of Session Activity Tolerance: Patient tolerated treatment well Patient left: in chair;with call bell/phone within reach Nurse Communication: Mobility status     Arvo Ealy OTR/L Pager number [phone] 01/05/2013, 11:25 AM

## 2013-01-05 NOTE — Progress Notes (Signed)
Physical Therapy Treatment Patient Details Name: Samantha Stevenson MRN: [id number] DOB: 1951-03-24 Today's Date: 01/05/2013 Time: 1914-7829 PT Time Calculation (min): 21 min  PT Assessment / Plan / Recommendation Comments on Treatment Session  Pt continues to progress well and has practiced stairs this pm with return demonstration.  Will be ready for D/C tomorrow.     Follow Up Recommendations  Home health PT     Does the patient have the potential to tolerate intense rehabilitation     Barriers to Discharge        Equipment Recommendations  Rolling walker with 5" wheels    Recommendations for Other Services    Frequency 7X/week   Plan Discharge plan remains appropriate    Precautions / Restrictions Precautions Precautions: Fall Restrictions Weight Bearing Restrictions: No Other Position/Activity Restrictions: WBAT   Pertinent Vitals/Pain 2/10 pain, ice pack applied    Mobility  Bed Mobility Bed Mobility: Supine to Sit;Sit to Supine Supine to Sit: 4: Min assist;HOB flat Sit to Supine: 4: Min assist Details for Bed Mobility Assistance: Continues to require assist for LLE out of bed and into bed.  Educated pt on using RLE or sheet to self assist LE into bed.  Transfers Transfers: Sit to Stand;Stand to Sit Sit to Stand: 5: Supervision;From elevated surface;From bed Stand to Sit: 6: Modified independent (Device/Increase time) Details for Transfer Assistance: Single cue for hand placement when standing.  Ambulation/Gait Ambulation/Gait Assistance: 5: Supervision Ambulation Distance (Feet): 138 Feet Assistive device: Rolling walker Ambulation/Gait Assistance Details: Very min cues for upright posture.  Pt doing much better with relaxing shoulders.  Gait Pattern: Step-to pattern;Decreased step length - right;Decreased step length - left Stairs: Yes Stairs Assistance: 4: Min guard Stairs Assistance Details (indicate cue type and reason): Min/guard for safety with cues for  sequencing/technique.  Stair Management Technique: Two rails;Step to pattern;Backwards Number of Stairs: 5    Exercises     PT Diagnosis:    PT Problem List:   PT Treatment Interventions:     PT Goals Acute Rehab PT Goals PT Goal Formulation: With patient Time For Goal Achievement: 01/08/13 Potential to Achieve Goals: Good Pt will go Supine/Side to Sit: with supervision PT Goal: Supine/Side to Sit - Progress: Progressing toward goal Pt will go Sit to Supine/Side: with supervision PT Goal: Sit to Supine/Side - Progress: Progressing toward goal Pt will go Sit to Stand: with modified independence PT Goal: Sit to Stand - Progress: Updated due to goal met Pt will go Stand to Sit: with modified independence PT Goal: Stand to Sit - Progress: Met Pt will Ambulate: 51 - 150 feet;with rolling walker;with modified independence PT Goal: Ambulate - Progress: Progressing toward goal Pt will Go Up / Down Stairs: Flight;with min assist;with least restrictive assistive device PT Goal: Up/Down Stairs - Progress: Progressing toward goal  Visit Information  Last PT Received On: 01/05/13 Assistance Needed: +1    Subjective Data  Subjective: The stairs weren't as bad as I thought they would be.  Patient Stated Goal: Resume previous lifestyle with decreased pain   Cognition  Cognition Overall Cognitive Status: Appears within functional limits for tasks assessed/performed Arousal/Alertness: Awake/alert Orientation Level: Appears intact for tasks assessed Behavior During Session: [facility] for tasks performed    Balance     End of Session PT - End of Session Activity Tolerance: Patient tolerated treatment well Patient left: in bed;with call bell/phone within reach;with family/visitor present Nurse Communication: Mobility status   GP     Harriet Butte  Ann 01/05/2013, 4:23 PM

## 2013-01-06 ENCOUNTER — Encounter (HOSPITAL_COMMUNITY): Payer: Self-pay | Admitting: Orthopaedic Surgery

## 2013-01-06 LAB — CBC
HCT: 27.3 % — ABNORMAL LOW (ref 36.0–46.0)
Hemoglobin: 8.6 g/dL — ABNORMAL LOW (ref 12.0–15.0)
MCH: 27 pg (ref 26.0–34.0)
MCHC: 31.5 g/dL (ref 30.0–36.0)
MCV: 85.8 fL (ref 78.0–100.0)
Platelets: 322 10*3/uL (ref 150–400)
RBC: 3.18 MIL/uL — ABNORMAL LOW (ref 3.87–5.11)
RDW: 15.6 % — ABNORMAL HIGH (ref 11.5–15.5)
WBC: 13.4 10*3/uL — ABNORMAL HIGH (ref 4.0–10.5)

## 2013-01-06 MED ORDER — OXYCODONE-ACETAMINOPHEN 5-325 MG PO TABS: 1.0000 | ORAL_TABLET | ORAL | Status: DC | PRN

## 2013-01-06 MED ORDER — ROLLER WALKER MISC: Status: DC

## 2013-01-06 MED ORDER — ASPIRIN 325 MG PO TBEC: 325.0000 mg | DELAYED_RELEASE_TABLET | Freq: Every day | ORAL | Status: DC

## 2013-01-06 MED ORDER — METHOCARBAMOL 500 MG PO TABS: 500.0000 mg | ORAL_TABLET | Freq: Four times a day (QID) | ORAL | Status: DC | PRN

## 2013-01-06 NOTE — Progress Notes (Signed)
Occupational Therapy Treatment Patient Details Name: Samantha Stevenson MRN: [id number] DOB: [birth year] Today's Date: 01/06/2013 Time: 4098-1191 OT Time Calculation (min): 16 min  OT Assessment / Plan / Recommendation All OT goals met. Will DC Acute OT                   Plan Discharge plan remains appropriate    Precautions / Restrictions Precautions Precautions: Fall Restrictions Weight Bearing Restrictions: No Other Position/Activity Restrictions: WBAT       ADL  Tub/Shower Transfer: Performed;Other (comment) (walk in shower) Tub/Shower Transfer Method: Science writer: Walk in shower Transfers/Ambulation Related to ADLs: Pts daughter went to purchase AE for pt while OT educated pt on shower transfer. Pt did not have any further questions regarding ADL activity      OT Goals ADL Goals ADL Goal: Tub/Shower Transfer - Progress: Met  Visit Information  Last OT Received On: 01/06/13 Assistance Needed: +1    Subjective Data  Subjective: I am just waiting on my walker      Cognition  Cognition Overall Cognitive Status: Appears within functional limits for tasks assessed/performed    Mobility  Bed Mobility Supine to Sit: 6: Modified independent (Device/Increase time);HOB flat Sit to Supine: 6: Modified independent (Device/Increase time);HOB flat Details for Bed Mobility Assistance: pt able to get leg onto bed. Transfers Sit to Stand: 6: Modified independent (Device/Increase time) Stand to Sit: 6: Modified independent (Device/Increase time)    Exercises  Total Joint Exercises Short Arc Quad: AROM;Left;10 reps Heel Slides: 10 reps;Supine;Left;AROM Hip ABduction/ADduction: 10 reps;Supine;Left;AROM      End of Session OT - End of Session Activity Tolerance: Patient tolerated treatment well Patient left: with family/visitor present;in bed;with call bell/phone within reach  GO     Samantha Stevenson, [place] Kung 01/06/2013, 1:39 PM

## 2013-01-06 NOTE — Progress Notes (Signed)
Physical Therapy Treatment Patient Details Name: Samantha Stevenson MRN: [id number] DOB: [birth year] Today's Date: 01/06/2013 Time: 4098-1191 PT Time Calculation (min): 21 min  PT Assessment / Plan / Recommendation Comments on Treatment Session  Pt. is moving well and ambulating with RW. to be DC'd today./    Follow Up Recommendations  Home health PT     Does the patient have the potential to tolerate intense rehabilitation     Barriers to Discharge        Equipment Recommendations  Rolling walker with 5" wheels    Recommendations for Other Services    Frequency     Plan Discharge plan remains appropriate    Precautions / Restrictions Precautions Precautions: None Restrictions Weight Bearing Restrictions: No   Pertinent Vitals/Pain Sore L thigh   Mobility  Bed Mobility Supine to Sit: 6: Modified independent (Device/Increase time);HOB flat Sit to Supine: 6: Modified independent (Device/Increase time);HOB flat Details for Bed Mobility Assistance: pt able to get leg onto bed. Transfers Sit to Stand: 6: Modified independent (Device/Increase time) Stand to Sit: 6: Modified independent (Device/Increase time) Ambulation/Gait Ambulation/Gait Assistance: 6: Modified independent (Device/Increase time) Ambulation Distance (Feet): 150 Feet Assistive device: Rolling walker Gait Pattern: Step-through pattern;Decreased step length - left    Exercises Total Joint Exercises Short Arc Quad: AROM;Left;10 reps Heel Slides: 10 reps;Supine;Left;AROM Hip ABduction/ADduction: 10 reps;Supine;Left;AROM   PT Diagnosis:    PT Problem List:   PT Treatment Interventions:     PT Goals Acute Rehab PT Goals Pt will go Supine/Side to Sit: with supervision PT Goal: Supine/Side to Sit - Progress: Met Pt will go Sit to Supine/Side: with supervision PT Goal: Sit to Supine/Side - Progress: Met Pt will go Sit to Stand: with modified independence PT Goal: Sit to Stand - Progress: Met Pt will go  Stand to Sit: with modified independence PT Goal: Stand to Sit - Progress: Met Pt will Ambulate: 51 - 150 feet;with rolling walker;with modified independence PT Goal: Ambulate - Progress: Met  Visit Information  Last PT Received On: 01/06/13 Assistance Needed: +1    Subjective Data  Subjective: I will get a lift in my shoe after I go see Dr. Magnus Ivan.   Cognition  Cognition Overall Cognitive Status: Appears within functional limits for tasks assessed/performed    Balance     End of Session PT - End of Session Activity Tolerance: Patient tolerated treatment well Patient left: in bed;with call bell/phone within reach;with family/visitor present Nurse Communication: Mobility status   GP     Samantha, Stevenson 01/06/2013, 10:59 AM

## 2013-01-06 NOTE — Progress Notes (Signed)
Subjective: 3 Days Post-Op Procedure(s) (LRB): TOTAL HIP ARTHROPLASTY ANTERIOR APPROACH (Left) Patient reports pain as moderate.  Planning to go home today . Doing well with PT.  Objective: Vital signs in last 24 hours: Temp:  [98.2 F (36.8 C)-99.4 F (37.4 C)] 99.4 F (37.4 C) (03/17 0600) Pulse Rate:  [62-88] 88 (03/17 0600) Resp:  [18-20] 18 (03/17 0600) BP: (121-146)/(62-81) 146/81 mmHg (03/17 0600) SpO2:  [93 %-98 %] 96 % (03/17 0600)  Intake/Output from previous day: 03/16 0701 - 03/17 0700 In: 830.3 [P.O.:780; I.V.:50.3] Out: 250 [Urine:250] Intake/Output this shift:     Recent Labs  01/04/13 0520 01/05/13 0412 01/06/13 0405  HGB 8.9* 8.2* 8.6*    Recent Labs  01/05/13 0412 01/06/13 0405  WBC 13.0* 13.4*  RBC 2.92* 3.18*  HCT 25.0* 27.3*  PLT 298 322    Recent Labs  01/04/13 0520  NA 135  K 4.2  CL 100  CO2 27  BUN 12  CREATININE 0.62  GLUCOSE 134*  CALCIUM 8.4   No results found for this basename: LABPT, INR,  in the last 72 hours  Left lower leg: Neurovascular intact Compartment soft Left hip incision dressing clean dry and intact  Assessment/Plan: 3 Days Post-Op Procedure(s) (LRB): TOTAL HIP ARTHROPLASTY ANTERIOR APPROACH (Left) Discharge home with home health Spoke with patient about aspirin she tolerates well as long as enteric coated. Follow-up with Dr. Magnus Ivan in 2 weeks  Richardean Canal 01/06/2013, 8:32 AM

## 2013-01-06 NOTE — Progress Notes (Signed)
Pt waiting on required DME. Verified with PA that pt cannot leave without needed rolling walker due to safety concerns.

## 2013-01-07 NOTE — Progress Notes (Signed)
Subjective: 4 Days Post-Op Procedure(s) (LRB): TOTAL HIP ARTHROPLASTY ANTERIOR APPROACH (Left) Patient reports pain as mild.  Equipment needs have been met. No complaints.   Objective: Vital signs in last 24 hours: Temp:  [98.5 F (36.9 C)-99.2 F (37.3 C)] 99 F (37.2 C) (03/18 0556) Pulse Rate:  [88-97] 90 (03/18 0556) Resp:  [18-20] 18 (03/18 0556) BP: (113-129)/(56-70) 129/70 mmHg (03/18 0556) SpO2:  [93 %-98 %] 98 % (03/18 0556)  Intake/Output from previous day: 03/17 0701 - 03/18 0700 In: 1200 [P.O.:1200] Out: 0  Intake/Output this shift:     Recent Labs  01/05/13 0412 01/06/13 0405  HGB 8.2* 8.6*    Recent Labs  01/05/13 0412 01/06/13 0405  WBC 13.0* 13.4*  RBC 2.92* 3.18*  HCT 25.0* 27.3*  PLT 298 322   No results found for this basename: NA, K, CL, CO2, BUN, CREATININE, GLUCOSE, CALCIUM,  in the last 72 hours No results found for this basename: LABPT, INR,  in the last 72 hours  Left lower extremity: Neurovascular intact Compartment soft  Assessment/Plan: 4 Days Post-Op Procedure(s) (LRB): TOTAL HIP ARTHROPLASTY ANTERIOR APPROACH (Left) Discharge home with home health  Richardean Canal 01/07/2013, 12:34 PM

## 2013-01-07 NOTE — Progress Notes (Signed)
PT NOTE:  Pt states she is IND with mobility in room and has been performing there-ex as well.  Pt declines PT services this am and is prepping for d/c.

## 2013-01-10 ENCOUNTER — Emergency Department (HOSPITAL_COMMUNITY): Payer: Medicare HMO

## 2013-01-10 ENCOUNTER — Inpatient Hospital Stay (HOSPITAL_COMMUNITY)
Admission: EM | Admit: 2013-01-10 | Discharge: 2013-01-12 | DRG: 176 | Disposition: A | Discharge status: Home Health | Payer: Medicare HMO | Attending: Internal Medicine | Admitting: Internal Medicine

## 2013-01-10 ENCOUNTER — Encounter (HOSPITAL_COMMUNITY): Payer: Self-pay | Admitting: *Deleted

## 2013-01-10 DIAGNOSIS — T81718A Complication of other artery following a procedure, not elsewhere classified, initial encounter: Principal | ICD-10-CM | POA: Diagnosis present

## 2013-01-10 DIAGNOSIS — Z86711 Personal history of pulmonary embolism: Secondary | ICD-10-CM

## 2013-01-10 DIAGNOSIS — E871 Hypo-osmolality and hyponatremia: Secondary | ICD-10-CM | POA: Diagnosis present

## 2013-01-10 DIAGNOSIS — I2699 Other pulmonary embolism without acute cor pulmonale: Principal | ICD-10-CM | POA: Diagnosis present

## 2013-01-10 DIAGNOSIS — I4891 Unspecified atrial fibrillation: Secondary | ICD-10-CM | POA: Diagnosis present

## 2013-01-10 DIAGNOSIS — Z888 Allergy status to other drugs, medicaments and biological substances status: Secondary | ICD-10-CM

## 2013-01-10 DIAGNOSIS — Z90710 Acquired absence of both cervix and uterus: Secondary | ICD-10-CM

## 2013-01-10 DIAGNOSIS — Y831 Surgical operation with implant of artificial internal device as the cause of abnormal reaction of the patient, or of later complication, without mention of misadventure at the time of the procedure: Secondary | ICD-10-CM | POA: Diagnosis present

## 2013-01-10 DIAGNOSIS — Z91040 Latex allergy status: Secondary | ICD-10-CM

## 2013-01-10 DIAGNOSIS — Z7902 Long term (current) use of antithrombotics/antiplatelets: Secondary | ICD-10-CM

## 2013-01-10 DIAGNOSIS — R9431 Abnormal electrocardiogram [ECG] [EKG]: Secondary | ICD-10-CM | POA: Diagnosis present

## 2013-01-10 DIAGNOSIS — I252 Old myocardial infarction: Secondary | ICD-10-CM

## 2013-01-10 DIAGNOSIS — Z7982 Long term (current) use of aspirin: Secondary | ICD-10-CM

## 2013-01-10 DIAGNOSIS — Y92009 Unspecified place in unspecified non-institutional (private) residence as the place of occurrence of the external cause: Secondary | ICD-10-CM

## 2013-01-10 DIAGNOSIS — Z9089 Acquired absence of other organs: Secondary | ICD-10-CM

## 2013-01-10 DIAGNOSIS — R0602 Shortness of breath: Secondary | ICD-10-CM

## 2013-01-10 DIAGNOSIS — Z880 Allergy status to penicillin: Secondary | ICD-10-CM

## 2013-01-10 DIAGNOSIS — D649 Anemia, unspecified: Secondary | ICD-10-CM | POA: Diagnosis present

## 2013-01-10 DIAGNOSIS — I10 Essential (primary) hypertension: Secondary | ICD-10-CM | POA: Diagnosis present

## 2013-01-10 DIAGNOSIS — K219 Gastro-esophageal reflux disease without esophagitis: Secondary | ICD-10-CM | POA: Diagnosis present

## 2013-01-10 DIAGNOSIS — Z886 Allergy status to analgesic agent status: Secondary | ICD-10-CM

## 2013-01-10 DIAGNOSIS — Z96649 Presence of unspecified artificial hip joint: Secondary | ICD-10-CM

## 2013-01-10 DIAGNOSIS — E785 Hyperlipidemia, unspecified: Secondary | ICD-10-CM | POA: Diagnosis present

## 2013-01-10 DIAGNOSIS — M169 Osteoarthritis of hip, unspecified: Secondary | ICD-10-CM

## 2013-01-10 LAB — CBC WITH DIFFERENTIAL/PLATELET
Basophils Absolute: 0 10*3/uL (ref 0.0–0.1)
Basophils Relative: 0 % (ref 0–1)
Eosinophils Absolute: 0.4 10*3/uL (ref 0.0–0.7)
Eosinophils Relative: 3 % (ref 0–5)
HCT: 28.3 % — ABNORMAL LOW (ref 36.0–46.0)
Hemoglobin: 8.8 g/dL — ABNORMAL LOW (ref 12.0–15.0)
Lymphocytes Relative: 26 % (ref 12–46)
Lymphs Abs: 3.2 10*3/uL (ref 0.7–4.0)
MCH: 26.7 pg (ref 26.0–34.0)
MCHC: 31.1 g/dL (ref 30.0–36.0)
MCV: 85.8 fL (ref 78.0–100.0)
Monocytes Absolute: 0.7 10*3/uL (ref 0.1–1.0)
Monocytes Relative: 6 % (ref 3–12)
Neutro Abs: 7.9 10*3/uL — ABNORMAL HIGH (ref 1.7–7.7)
Neutrophils Relative %: 64 % (ref 43–77)
Platelets: 493 10*3/uL — ABNORMAL HIGH (ref 150–400)
RBC: 3.3 MIL/uL — ABNORMAL LOW (ref 3.87–5.11)
RDW: 15.3 % (ref 11.5–15.5)
WBC: 12.2 10*3/uL — ABNORMAL HIGH (ref 4.0–10.5)

## 2013-01-10 LAB — APTT: aPTT: 36 s (ref 24–37)

## 2013-01-10 LAB — BASIC METABOLIC PANEL WITH GFR
BUN: 12 mg/dL (ref 6–23)
CO2: 27 meq/L (ref 19–32)
Calcium: 9 mg/dL (ref 8.4–10.5)
Chloride: 94 meq/L — ABNORMAL LOW (ref 96–112)
Creatinine, Ser: 0.7 mg/dL (ref 0.50–1.10)
GFR calc Af Amer: >90 mL/min
GFR calc non Af Amer: >90 mL/min
Glucose, Bld: 105 mg/dL — ABNORMAL HIGH (ref 70–99)
Potassium: 4.2 meq/L (ref 3.5–5.1)
Sodium: 131 meq/L — ABNORMAL LOW (ref 135–145)

## 2013-01-10 LAB — POCT I-STAT TROPONIN I: Troponin i, poc: 0 ng/mL (ref 0.00–0.08)

## 2013-01-10 LAB — PROTIME-INR
INR: 1.11 (ref 0.00–1.49)
Prothrombin Time: 14.2 seconds (ref 11.6–15.2)

## 2013-01-10 LAB — TROPONIN I: Troponin I: <0.3 ng/mL (ref <0.30)

## 2013-01-10 LAB — DIGOXIN LEVEL: Digoxin Level: 1 ng/mL (ref 0.8–2.0)

## 2013-01-10 LAB — HEPARIN LEVEL (UNFRACTIONATED): Heparin Unfractionated: 0.22 IU/mL — ABNORMAL LOW (ref 0.30–0.70)

## 2013-01-10 MED ORDER — ONDANSETRON HCL 4 MG/2ML IJ SOLN: 4.0000 mg | Freq: Three times a day (TID) | INTRAMUSCULAR | Status: DC | PRN

## 2013-01-10 MED ORDER — SODIUM CHLORIDE 0.9 % IV SOLN
INTRAVENOUS | Status: AC
  Administered 2013-01-10: 17:00:00 via INTRAVENOUS

## 2013-01-10 MED ORDER — IOHEXOL 300 MG/ML  SOLN: 100.0000 mL | Freq: Once | INTRAMUSCULAR | Status: DC | PRN

## 2013-01-10 MED ORDER — SODIUM CHLORIDE 0.9 % IJ SOLN
3.0000 mL | Freq: Two times a day (BID) | INTRAMUSCULAR | Status: DC
  Administered 2013-01-11 – 2013-01-12 (×3): 3 mL via INTRAVENOUS

## 2013-01-10 MED ORDER — OXYCODONE-ACETAMINOPHEN 5-325 MG PO TABS
1.0000 | ORAL_TABLET | ORAL | Status: DC | PRN
  Administered 2013-01-10 – 2013-01-12 (×7): 1 via ORAL
  Filled 2013-01-10: qty 2
  Filled 2013-01-10: qty 1
  Filled 2013-01-10: qty 2
  Filled 2013-01-10 (×4): qty 1

## 2013-01-10 MED ORDER — METHOCARBAMOL 500 MG PO TABS
500.0000 mg | ORAL_TABLET | Freq: Four times a day (QID) | ORAL | Status: DC | PRN
  Administered 2013-01-10 – 2013-01-12 (×5): 500 mg via ORAL
  Filled 2013-01-10 (×5): qty 1

## 2013-01-10 MED ORDER — LORAZEPAM 2 MG/ML IJ SOLN
0.5000 mg | Freq: Once | INTRAMUSCULAR | Status: AC
  Administered 2013-01-10: 0.5 mg via INTRAVENOUS
  Filled 2013-01-10: qty 1

## 2013-01-10 MED ORDER — ACETAMINOPHEN 325 MG PO TABS: 650.0000 mg | ORAL_TABLET | Freq: Four times a day (QID) | ORAL | Status: DC | PRN

## 2013-01-10 MED ORDER — DILTIAZEM HCL ER 240 MG PO CP24
240.0000 mg | ORAL_CAPSULE | Freq: Every morning | ORAL | Status: DC
  Administered 2013-01-11 – 2013-01-12 (×2): 240 mg via ORAL
  Filled 2013-01-10 (×2): qty 1

## 2013-01-10 MED ORDER — IOHEXOL 350 MG/ML SOLN
100.0000 mL | Freq: Once | INTRAVENOUS | Status: AC | PRN
  Administered 2013-01-10: 80 mL via INTRAVENOUS

## 2013-01-10 MED ORDER — ATORVASTATIN CALCIUM 10 MG PO TABS
10.0000 mg | ORAL_TABLET | Freq: Every day | ORAL | Status: DC
  Administered 2013-01-11: 10 mg via ORAL
  Filled 2013-01-10 (×2): qty 1

## 2013-01-10 MED ORDER — DIGOXIN 250 MCG PO TABS
0.2500 mg | ORAL_TABLET | Freq: Every morning | ORAL | Status: DC
  Administered 2013-01-11 – 2013-01-12 (×2): 0.25 mg via ORAL
  Filled 2013-01-10 (×2): qty 1

## 2013-01-10 MED ORDER — ASPIRIN EC 325 MG PO TBEC
325.0000 mg | DELAYED_RELEASE_TABLET | Freq: Every day | ORAL | Status: DC
  Administered 2013-01-11: 325 mg via ORAL
  Filled 2013-01-10 (×2): qty 1

## 2013-01-10 MED ORDER — ACETAMINOPHEN 650 MG RE SUPP: 650.0000 mg | Freq: Four times a day (QID) | RECTAL | Status: DC | PRN

## 2013-01-10 MED ORDER — ONDANSETRON HCL 4 MG PO TABS: 4.0000 mg | ORAL_TABLET | Freq: Four times a day (QID) | ORAL | Status: DC | PRN

## 2013-01-10 MED ORDER — HEPARIN (PORCINE) IN NACL 100-0.45 UNIT/ML-% IJ SOLN
900.0000 [IU]/h | INTRAMUSCULAR | Status: DC
  Administered 2013-01-10: 900 [IU]/h via INTRAVENOUS
  Filled 2013-01-10: qty 250

## 2013-01-10 MED ORDER — HEPARIN BOLUS VIA INFUSION
4000.0000 [IU] | Freq: Once | INTRAVENOUS | Status: AC
  Administered 2013-01-10: 4000 [IU] via INTRAVENOUS

## 2013-01-10 MED ORDER — SIMVASTATIN 20 MG PO TABS: 20.0000 mg | ORAL_TABLET | Freq: Every day | ORAL | Status: DC

## 2013-01-10 MED ORDER — FERROUS SULFATE 325 (65 FE) MG PO TABS
325.0000 mg | ORAL_TABLET | Freq: Every day | ORAL | Status: DC
  Administered 2013-01-11 – 2013-01-12 (×2): 325 mg via ORAL
  Filled 2013-01-10 (×3): qty 1

## 2013-01-10 MED ORDER — ONDANSETRON HCL 4 MG/2ML IJ SOLN: 4.0000 mg | Freq: Four times a day (QID) | INTRAMUSCULAR | Status: DC | PRN

## 2013-01-10 NOTE — Discharge Summary (Signed)
Physician Discharge Summary  Patient ID: Samantha Stevenson MRN: [id number] DOB/AGE: 1950-12-01 62 y.o.  Admit date: 01/03/2013 Discharge date: 01/10/2013  Admission Diagnoses: Left hip degenerative arthritis  Discharge Diagnoses:  Principal Problem:   Degenerative arthritis of hip   Discharged Condition: Stable to home  Hospital Course: Patient remained stable throughout hospital course without complications. Asyptomatic Acute Blood loss anemia was treated with ferrous sulfate. Patient discharge held up by one day due to DME needs.  Consults: None  Significant Diagnostic Studies: CBC  Treatments: Iron replacement  Discharge Exam: Blood pressure 129/70, pulse 90, temperature 99 F (37.2 C), temperature source Oral, resp. rate 18, height 5' (1.524 m), weight 78.019 kg (172 lb), SpO2 98.00%.  Physical Exam: General - Alert oriented and no acute distress Left lower Extremity- calf supple non tender foot NVI , dorsiflexion plantar flexion intact, dressing clean dry and intact   Disposition: home with Home Health  Discharge Orders   Future Orders Complete By Expires     DME Other see comment  As directed     Comments:      3 in 1    Discharge wound care:  As directed     Comments:      Keep dressing clean dry and intact till Wednesday then may remove dressing and shower    Weight bearing as tolerated  As directed         Medication List    STOP taking these medications       acetaminophen 500 MG tablet  Commonly known as:  TYLENOL      TAKE these medications       aspirin 325 MG EC tablet  Take 1 tablet (325 mg total) by mouth daily with breakfast.     digoxin 0.25 MG tablet  Commonly known as:  LANOXIN  Take 0.25 mg by mouth every morning.     diltiazem 240 MG 24 hr capsule  Commonly known as:  DILACOR XR  Take 240 mg by mouth every morning.     ferrous sulfate 325 (65 FE) MG tablet  Take 1 tablet (325 mg total) by mouth 3 (three) times daily after meals.      furosemide 20 MG tablet  Commonly known as:  LASIX  Take 20 mg by mouth daily as needed (for fluid).     methocarbamol 500 MG tablet  Commonly known as:  ROBAXIN  Take 1 tablet (500 mg total) by mouth every 6 (six) hours as needed.     multivitamin tablet  Take 1 tablet by mouth daily.     oxyCODONE-acetaminophen 5-325 MG per tablet  Commonly known as:  ROXICET  Take 1-2 tablets by mouth every 4 (four) hours as needed for pain.     pravastatin 40 MG tablet  Commonly known as:  PRAVACHOL  Take 40 mg by mouth every morning.     Roller Walker Misc  Weight bearing as tolerated           Follow-up Information   Follow up with [employer]. Call in 1 day. (Pick up 3-in-1 at this address)    Contact information:   [address] [place], [place] [address]  [phone]      Signed: Richardean Canal 01/10/2013, 9:15 AM

## 2013-01-10 NOTE — ED Notes (Signed)
Pt reports she was admitted to hospital for hip replacement, d/c on 3/18. Was doing well until 2 days ago, began having shob and sensation that "throat is closing up and I can't air." Pain to L chest and back last night, denies pain currently. PT to home today and told pt that her BP was low, "70 over something, I forgot", and suggested coming to hospital.

## 2013-01-10 NOTE — ED Notes (Signed)
MD and PA at bedside. Unable to complete EKG within of arrival time due to this.

## 2013-01-10 NOTE — Progress Notes (Signed)
ANTICOAGULATION CONSULT NOTE - Initial Consult  Pharmacy Consult for IV Heparin Indication: pulmonary embolus  Allergies  Allergen Reactions  . Latex Hives and Rash  . Penicillins Other (See Comments)    Severe swelling  . Asa (Aspirin) Other (See Comments)    Makes stomach upset  . Celebrex (Celecoxib)     GI upset  . Nsaids     rash    Patient Measurements: Height: 4' 11.84" (152 cm) Weight: 169 lb 7 oz (76.856 kg) IBW/kg (Calculated) : 45.14 Heparin Dosing Weight: 54 kg  Vital Signs: Temp: 98.2 F (36.8 C) (03/21 1320) Temp src: Oral (03/21 1320) BP: 112/55 mmHg (03/21 1500) Pulse Rate: 66 (03/21 1500)  Labs:  Recent Labs  01/10/13 1349  HGB 8.8*  HCT 28.3*  PLT 493*  CREATININE 0.70    Estimated Creatinine Clearance: 68.2 ml/min (by C-G formula based on Cr of 0.7).   Medical History: Past Medical History  Diagnosis Date  . Arthritis   . Hyperlipidemia   . Hypertension   . Anemia   . Myocardial infarction 2000    due to atrial fib  . Shortness of breath     OCCASIONALL WITH EXERTION  . Atrial fibrillation     SINCE AGE 31 - MANAGED WITH MEDS  . GERD (gastroesophageal reflux disease)     CONTROLED WITH DIET  . Difficulty sleeping     Medications:  Scheduled:  . [COMPLETED] LORazepam  0.5 mg Intravenous Once   Infusions:  . heparin      Assessment: 62 yo s/p recent THA now with PE RLL.  Goal of Therapy:  Heparin level 0.3-0.7 units/ml Monitor platelets by anticoagulation protocol: Yes   Plan:   Baseline coags stat  Heparin 4000 unit bolus IV x1  Start drip @ 900 units/hr  Daily CBC/HL  Check 1st HL in 6 hours  [place], Rigdon 01/10/2013,3:58 PM

## 2013-01-10 NOTE — ED Notes (Signed)
Patient transported to CT 

## 2013-01-10 NOTE — ED Notes (Signed)
MD at bedside. 

## 2013-01-10 NOTE — H&P (Signed)
[facility] History and Physical  Genesis Novosad UYQ:[id number] DOB: 1950-12-16 DOA: 01/10/2013  Referring physician: er PCP: Enrique Sack, MD  Specialists:   Chief Complaint: SOB  HPI: Samantha Stevenson is a 62 y.o. female  Who was just discharged from the hospital after hip replacement surgery.  Patient was medicated prior ro my arrival and is unable to give information- daughter relays history.  Patient was c/o SOB before discharge on Tuesday after surgery.  Her SOB has worsened over the last few days.  She also had chest pain left side around should last PM.  And right posterior pain.  Worse with deep breaths.    She told the ER Dr that she has a sensation of her throat closing up.  She has been using albuterol at home with no relief of her SOB   In the ER, she was found to have a PE on CTA.  A bedside echo was done but did not show any right heart strain.  Heparin was started in the ER by the ER doctor.    Review of Systems: unable to do full ROS as patient was sleepy    Past Medical History  Diagnosis Date  . Arthritis   . Hyperlipidemia   . Hypertension   . Anemia   . Myocardial infarction 2000    due to atrial fib  . Shortness of breath     OCCASIONALL WITH EXERTION  . Atrial fibrillation     SINCE AGE 76 - MANAGED WITH MEDS  . GERD (gastroesophageal reflux disease)     CONTROLED WITH DIET  . Difficulty sleeping    Past Surgical History  Procedure Laterality Date  . Partial hysterectomy    . Tonsillectomy    . Vagina reconstruction surgery      vagina had closed up when 62 years old  . Total hip arthroplasty Left 01/03/2013    Procedure: TOTAL HIP ARTHROPLASTY ANTERIOR APPROACH;  Surgeon: Kathryne Hitch, MD;  Location: [facility];  Service: Orthopedics;  Laterality: Left;  Left Total Hip Arthroplasty, Anterior Approach   Social History:  reports that she has never smoked. She has never used smokeless tobacco. She reports that she does not drink  alcohol or use illicit drugs.   Allergies  Allergen Reactions  . Latex Hives and Rash  . Penicillins Other (See Comments)    Severe swelling  . Asa (Aspirin) Other (See Comments)    Makes stomach upset  . Celebrex (Celecoxib)     GI upset  . Nsaids     rash    Family History  Problem Relation Age of Onset  . Heart disease Father   . Diabetes Mother   . Diabetes Sister     Prior to Admission medications   Medication Sig Start Date End Date Taking? Authorizing Provider  aspirin 325 MG EC tablet Take 325 mg by mouth daily with breakfast. 01/06/13  Yes Richardean Canal, PA-C  digoxin (LANOXIN) 0.25 MG tablet Take 0.25 mg by mouth every morning.   Yes Historical Provider, MD  diltiazem (DILACOR XR) 240 MG 24 hr capsule Take 240 mg by mouth every morning.   Yes Historical Provider, MD  ferrous sulfate 325 (65 FE) MG tablet Take 325 mg by mouth daily with breakfast. 01/05/13  Yes Richardean Canal, PA-C  furosemide (LASIX) 20 MG tablet Take 20 mg by mouth daily as needed (for fluid).   Yes Historical Provider, MD  methocarbamol (ROBAXIN) 500 MG tablet Take 500 mg by mouth  every 6 (six) hours as needed (muscle spasm). 01/06/13  Yes Richardean Canal, PA-C  Multiple Vitamin (MULTIVITAMIN) tablet Take 1 tablet by mouth daily.   Yes Historical Provider, MD  oxyCODONE-acetaminophen (PERCOCET/ROXICET) 5-325 MG per tablet Take 1-2 tablets by mouth every 4 (four) hours as needed for pain. 01/06/13  Yes Richardean Canal, PA-C  pravastatin (PRAVACHOL) 40 MG tablet Take 40 mg by mouth every morning.    Yes Historical Provider, MD  Misc. Devices (ROLLER [place]) MISC Weight bearing as tolerated 01/06/13   Richardean Canal, PA-C   Physical Exam: Filed Vitals:   01/10/13 1453 01/10/13 1500 01/10/13 1555 01/10/13 1557  BP: 113/53 112/55    Pulse: 65 66    Temp:      TempSrc:      Resp: 16 19    Height:   4' 11.84" (1.52 m)   Weight:   78 kg (171 lb 15.3 oz) 76.856 kg (169 lb 7 oz)  SpO2: 97% 96%       General:   A+Ox3, NAD  Eyes: wnl  ENT: wnl  Neck: supple  Cardiovascular: rrr  Respiratory: clear anterior, no wheezing  Abdomen: +Bs, soft, NT/NT  Skin: no rashes or lesions  Musculoskeletal: moves all 4 extremitites  Psychiatric: sleepy  Neurologic: no focal deficits  Labs on Admission:  Basic Metabolic Panel:  Recent Labs Lab 01/04/13 0520 01/10/13 1349  NA 135 131*  K 4.2 4.2  CL 100 94*  CO2 27 27  GLUCOSE 134* 105*  BUN 12 12  CREATININE 0.62 0.70  CALCIUM 8.4 9.0   Liver Function Tests: No results found for this basename: AST, ALT, ALKPHOS, BILITOT, PROT, ALBUMIN,  in the last 168 hours No results found for this basename: LIPASE, AMYLASE,  in the last 168 hours No results found for this basename: AMMONIA,  in the last 168 hours CBC:  Recent Labs Lab 01/04/13 0520 01/05/13 0412 01/06/13 0405 01/10/13 1349  WBC 12.8* 13.0* 13.4* 12.2*  NEUTROABS  --   --   --  7.9*  HGB 8.9* 8.2* 8.6* 8.8*  HCT 27.7* 25.0* 27.3* 28.3*  MCV 84.7 85.6 85.8 85.8  PLT 346 298 322 493*   Cardiac Enzymes: No results found for this basename: CKTOTAL, CKMB, CKMBINDEX, TROPONINI,  in the last 168 hours  BNP (last 3 results) No results found for this basename: PROBNP,  in the last 8760 hours CBG: No results found for this basename: GLUCAP,  in the last 168 hours  Radiological Exams on Admission: Dg Chest 2 View  01/10/2013  *RADIOLOGY REPORT*  Clinical Data: Shortness of breath, history of atrial fibrillation  CHEST - 2 VIEW  Comparison: None.  Findings: Cardiomegaly is noted.  Minimal lower thoracic dextroscoliosis.  No acute infiltrate or pleural effusion.  No pulmonary edema.  Bony thorax is unremarkable.  IMPRESSION: No active disease.  Cardiomegaly.   Original Report Authenticated By: Natasha Mead, M.D.    Ct Angio Chest Pe W/cm &/or Wo Cm  01/10/2013  *RADIOLOGY REPORT*  Clinical Data: Hip replacement 01/07/2013.  Short of breath., back pain  CT ANGIOGRAPHY CHEST  Technique:   Multidetector CT imaging of the chest using the standard protocol during bolus administration of intravenous contrast. Multiplanar reconstructed images including MIPs were obtained and reviewed to evaluate the vascular anatomy.  Contrast: 1 OMNIPAQUE IOHEXOL 300 MG/ML  SOLN, 80mL OMNIPAQUE IOHEXOL 350 MG/ML SOLN seen  Comparison: Chest radiograph 01/10/2013  Findings: Within the right lower lobe posterior segment pulmonary artery there  is a tubular filling defect (images 149-156, series 10) consistent with acute pulmonary embolism.  There are no additional filling defects within the pulmonary arteries. No acute findings of the aorta or great vessels.  There is no pericardial fluid.  Mild coronary calcifications are noted.  Esophagus is normal.  Review of the lung parenchyma demonstrates no pulmonary infarction. No pneumonia pleural fluid.  No pneumothorax.  Limited view of the upper abdomen is unremarkable.  Limited view of the skeleton demonstrates degenerative spurring of the thoracic spine appear  IMPRESSION: 1.  Acute pulmonary embolism within a segmental branch of the right lower lobe.  Overall clot burden is minimal. 2.  No evidence of pulmonary infarction, pneumonia, or pneumothorax.  Critical results were conveyed to Dr. Manus Gunning on 01/10/2013 at 1540 hours hours   Original Report Authenticated By: Genevive Bi, M.D.     EKG: Independently reviewed. Inverted t waves in lead III  Assessment/Plan Active Problems:   SOB (shortness of breath)   Pulmonary emboli   EKG, abnormal   Hyponatremia   Anemia   1. PE- s/p surgery- heparin gtt- switch over to oral anticoagulation tomm. Duple LE b/L- most like this is a provoked event from the surgery- will place in tele bed on the floor 2. SOB- improved- wean off O2 as tolerated 3. Abnormal EKG- check CE 4. Hyponatremia- hold lasix and give IVF- check labs in AM 5. Anemia- watch- stable around 8.5- post op blood loss?    Code Status: full Family  Communication: daughter at bedside Disposition Plan: 2-3 days  Time spent: 70 min  Marlin Canary [facility] Pager [phone]  If 7PM-7AM, please contact night-coverage [email or web address] Password [name] 01/10/2013, 4:31 PM

## 2013-01-10 NOTE — ED Provider Notes (Signed)
History     CSN: [id number]  Arrival date & time 01/10/13  1307   First MD Initiated Contact with Patient 01/10/13 1312      Chief Complaint  Patient presents with  . Shortness of Breath  . Hypotension    (Consider location/radiation/quality/duration/timing/severity/associated sxs/prior treatment) HPI Comments: Pt presents to the ED for SOB, increasing since 3/18 but most severe over the past few days.  Describes SOB as a sensation of her throat closing up and she cannot get adequate breaths.  Had an episode of non-radiating, left sided chest pain last night.  Pt recently underwent hospitalization for left THA on 3/14, d/c from hospital on 3/18.  No complications noted during surgery.  Was given albuterol at d/c and has been using it without relief of sx.  Denies any abdominal pain, nausea, vomiting, diarrhea, LE edema, calf tenderness, fever, or dizziness.  Patient is a 62 y.o. female presenting with shortness of breath. The history is provided by the patient.  Shortness of Breath   Past Medical History  Diagnosis Date  . Arthritis   . Hyperlipidemia   . Hypertension   . Anemia   . Myocardial infarction 2000    due to atrial fib  . Shortness of breath     OCCASIONALL WITH EXERTION  . Atrial fibrillation     SINCE AGE 7 - MANAGED WITH MEDS  . GERD (gastroesophageal reflux disease)     CONTROLED WITH DIET  . Difficulty sleeping     Past Surgical History  Procedure Laterality Date  . Partial hysterectomy    . Tonsillectomy    . Vagina reconstruction surgery      vagina had closed up when 62 years old  . Total hip arthroplasty Left 01/03/2013    Procedure: TOTAL HIP ARTHROPLASTY ANTERIOR APPROACH;  Surgeon: Kathryne Hitch, MD;  Location: [facility];  Service: Orthopedics;  Laterality: Left;  Left Total Hip Arthroplasty, Anterior Approach    Family History  Problem Relation Age of Onset  . Heart disease Father   . Diabetes Mother   . Diabetes Sister     History   Substance Use Topics  . Smoking status: Never Smoker   . Smokeless tobacco: Never Used  . Alcohol Use: No    OB History   Grav Para Term Preterm Abortions TAB SAB Ect Mult Living                  Review of Systems  Respiratory: Positive for shortness of breath.   All other systems reviewed and are negative.    Allergies  Latex; Penicillins; Asa; Celebrex; and Nsaids  Home Medications   Current Outpatient Rx  Name  Route  Sig  Dispense  Refill  . aspirin 325 MG EC tablet   Oral   Take 325 mg by mouth daily with breakfast.         . digoxin (LANOXIN) 0.25 MG tablet   Oral   Take 0.25 mg by mouth every morning.         . diltiazem (DILACOR XR) 240 MG 24 hr capsule   Oral   Take 240 mg by mouth every morning.         . ferrous sulfate 325 (65 FE) MG tablet   Oral   Take 325 mg by mouth daily with breakfast.         . furosemide (LASIX) 20 MG tablet   Oral   Take 20 mg by mouth daily as needed (  for fluid).         . methocarbamol (ROBAXIN) 500 MG tablet   Oral   Take 500 mg by mouth every 6 (six) hours as needed (muscle spasm).         . Multiple Vitamin (MULTIVITAMIN) tablet   Oral   Take 1 tablet by mouth daily.         Marland Kitchen oxyCODONE-acetaminophen (PERCOCET/ROXICET) 5-325 MG per tablet   Oral   Take 1-2 tablets by mouth every 4 (four) hours as needed for pain.         . pravastatin (PRAVACHOL) 40 MG tablet   Oral   Take 40 mg by mouth every morning.          . Misc. Devices (ROLLER WALKER) MISC      Weight bearing as tolerated   1 each   0     BP 112/55  Pulse 66  Temp(Src) 98.2 F (36.8 C) (Oral)  Resp 19  SpO2 96%  Physical Exam  Nursing note and vitals reviewed. Constitutional: She is oriented to person, place, and time. She appears well-developed and well-nourished.  HENT:  Head: Normocephalic and atraumatic.  Mouth/Throat: Oropharynx is clear and moist.  Eyes: Conjunctivae and EOM are normal. Pupils are equal,  round, and reactive to light.  Neck: Normal range of motion. Neck supple.  Cardiovascular: Normal rate, regular rhythm and normal heart sounds.   Pulmonary/Chest: Effort normal and breath sounds normal. No respiratory distress. She has no wheezes.  Abdominal: Soft. Bowel sounds are normal. There is no tenderness.  Musculoskeletal: Normal range of motion. She exhibits no edema.  No calf TTP bilaterally  Neurological: She is alert and oriented to person, place, and time. She has normal strength. No cranial nerve deficit or sensory deficit.  Skin: Skin is warm and dry.  Psychiatric: She has a normal mood and affect.    ED Course  Procedures (including critical care time)   Date: 01/10/2013  Rate: 69  Rhythm: normal sinus rhythm  QRS Axis: normal  Intervals: normal  ST/T Wave abnormalities: normal  Conduction Disutrbances:left anterior fascicular block  Narrative Interpretation:   Old EKG Reviewed: unchanged    Labs Reviewed  CBC WITH DIFFERENTIAL - Abnormal; Notable for the following:    WBC 12.2 (*)    RBC 3.30 (*)    Hemoglobin 8.8 (*)    HCT 28.3 (*)    Platelets 493 (*)    Neutro Abs 7.9 (*)    All other components within normal limits  BASIC METABOLIC PANEL - Abnormal; Notable for the following:    Sodium 131 (*)    Chloride 94 (*)    Glucose, Bld 105 (*)    All other components within normal limits  HEPARIN LEVEL (UNFRACTIONATED) - Abnormal; Notable for the following:    Heparin Unfractionated 0.22 (*)    All other components within normal limits  CBC - Abnormal; Notable for the following:    WBC 11.2 (*)    RBC 3.07 (*)    Hemoglobin 8.3 (*)    HCT 26.4 (*)    RDW 15.7 (*)    Platelets 452 (*)    All other components within normal limits  DIGOXIN LEVEL  APTT  PROTIME-INR  TROPONIN I  TROPONIN I  TROPONIN I  BASIC METABOLIC PANEL  MAGNESIUM  POCT I-STAT TROPONIN I   Dg Chest 2 View  01/10/2013  *RADIOLOGY REPORT*  Clinical Data: Shortness of breath,  history of atrial fibrillation  CHEST - 2 VIEW  Comparison: None.  Findings: Cardiomegaly is noted.  Minimal lower thoracic dextroscoliosis.  No acute infiltrate or pleural effusion.  No pulmonary edema.  Bony thorax is unremarkable.  IMPRESSION: No active disease.  Cardiomegaly.   Original Report Authenticated By: Natasha Mead, M.D.    Ct Angio Chest Pe W/cm &/or Wo Cm  01/10/2013  *RADIOLOGY REPORT*  Clinical Data: Hip replacement 01/07/2013.  Short of breath., back pain  CT ANGIOGRAPHY CHEST  Technique:  Multidetector CT imaging of the chest using the standard protocol during bolus administration of intravenous contrast. Multiplanar reconstructed images including MIPs were obtained and reviewed to evaluate the vascular anatomy.  Contrast: 1 OMNIPAQUE IOHEXOL 300 MG/ML  SOLN, 80mL OMNIPAQUE IOHEXOL 350 MG/ML SOLN seen  Comparison: Chest radiograph 01/10/2013  Findings: Within the right lower lobe posterior segment pulmonary artery there is a tubular filling defect (images 149-156, series 10) consistent with acute pulmonary embolism.  There are no additional filling defects within the pulmonary arteries. No acute findings of the aorta or great vessels.  There is no pericardial fluid.  Mild coronary calcifications are noted.  Esophagus is normal.  Review of the lung parenchyma demonstrates no pulmonary infarction. No pneumonia pleural fluid.  No pneumothorax.  Limited view of the upper abdomen is unremarkable.  Limited view of the skeleton demonstrates degenerative spurring of the thoracic spine appear  IMPRESSION: 1.  Acute pulmonary embolism within a segmental branch of the right lower lobe.  Overall clot burden is minimal. 2.  No evidence of pulmonary infarction, pneumonia, or pneumothorax.  Critical results were conveyed to Dr. Manus Gunning on 01/10/2013 at 1540 hours hours   Original Report Authenticated By: Genevive Bi, M.D.      1. Pulmonary embolism   2. Anemia   3. EKG, abnormal   4. Hyponatremia        MDM   62 y.o. Female presenting to the ED for SOB  increasing since 3/18 but most severe over the past few days.  Pt is s/p THA on 3/14, d/c on 3/18.  Has been using albuterol inhaler given to her at d/c without relief.  Pt is anxious in the ED- ativan given with good resolution of sx.  CT angio revealed acute PE within segmental branch of RLL.  Hospitalist was consulted, pt will be admitted.          Garlon Hatchet, PA-C 01/11/13 2239

## 2013-01-10 NOTE — ED Notes (Signed)
Attempted to call report x1, RN in isolation room, requests call back.

## 2013-01-11 DIAGNOSIS — I2699 Other pulmonary embolism without acute cor pulmonale: Secondary | ICD-10-CM

## 2013-01-11 LAB — CBC
HCT: 26.4 % — ABNORMAL LOW (ref 36.0–46.0)
Hemoglobin: 8.3 g/dL — ABNORMAL LOW (ref 12.0–15.0)
MCH: 27 pg (ref 26.0–34.0)
MCHC: 31.4 g/dL (ref 30.0–36.0)
MCV: 86 fL (ref 78.0–100.0)
Platelets: 452 10*3/uL — ABNORMAL HIGH (ref 150–400)
RBC: 3.07 MIL/uL — ABNORMAL LOW (ref 3.87–5.11)
RDW: 15.7 % — ABNORMAL HIGH (ref 11.5–15.5)
WBC: 11.2 10*3/uL — ABNORMAL HIGH (ref 4.0–10.5)

## 2013-01-11 LAB — TROPONIN I
Troponin I: <0.3 ng/mL (ref <0.30)
Troponin I: <0.3 ng/mL (ref <0.30)

## 2013-01-11 LAB — BASIC METABOLIC PANEL
BUN: 11 mg/dL (ref 6–23)
CO2: 29 mEq/L (ref 19–32)
Calcium: 8.6 mg/dL (ref 8.4–10.5)
Chloride: 100 mEq/L (ref 96–112)
Creatinine, Ser: 0.69 mg/dL (ref 0.50–1.10)
GFR calc Af Amer: >90 mL/min (ref >90)
GFR calc non Af Amer: >90 mL/min (ref >90)
Glucose, Bld: 98 mg/dL (ref 70–99)
Potassium: 4.1 mEq/L (ref 3.5–5.1)
Sodium: 135 mEq/L (ref 135–145)

## 2013-01-11 LAB — MAGNESIUM: Magnesium: 2.2 mg/dL (ref 1.5–2.5)

## 2013-01-11 MED ORDER — RIVAROXABAN 15 MG PO TABS
15.0000 mg | ORAL_TABLET | Freq: Two times a day (BID) | ORAL | Status: DC
  Administered 2013-01-11: 15 mg via ORAL
  Filled 2013-01-11 (×2): qty 1

## 2013-01-11 MED ORDER — HEPARIN (PORCINE) IN NACL 100-0.45 UNIT/ML-% IJ SOLN
1100.0000 [IU]/h | INTRAMUSCULAR | Status: AC
  Filled 2013-01-11: qty 250

## 2013-01-11 MED ORDER — RIVAROXABAN 20 MG PO TABS: 20.0000 mg | ORAL_TABLET | Freq: Every day | ORAL | Status: DC

## 2013-01-11 MED ORDER — RIVAROXABAN 15 MG PO TABS
15.0000 mg | ORAL_TABLET | Freq: Two times a day (BID) | ORAL | Status: DC
  Administered 2013-01-11 – 2013-01-12 (×2): 15 mg via ORAL
  Filled 2013-01-11 (×4): qty 1

## 2013-01-11 NOTE — Progress Notes (Signed)
VASCULAR LAB PRELIMINARY  PRELIMINARY  PRELIMINARY  PRELIMINARY  Bilateral lower extremity venous Dopplers completed.    Preliminary report:  There is no DVT or SVT noted in the bilateral lower extremities.  Deidre Carino, RVT 01/11/2013, 10:53 AM

## 2013-01-11 NOTE — Progress Notes (Addendum)
[facility] PROGRESS NOTE  Samantha Stevenson ZOX:[id number] DOB: 22-Mar-1951 DOA: 01/10/2013 PCP: Enrique Sack, MD  Assessment/Plan: 1. PE- s/p surgery- heparin gtt- switch over to oral anticoagulation today. Duplex LE b/L- most like this is a provoked event from the surgery- will place in tele bed on the floor- consult case management to be sure patient can afford xarelto- paged on call ortho to be sure patient can be anticoagulated from their standpoint- no call back 2. SOB- improved- wean off O2 as tolerated 3. Abnormal EKG- check CE 4. Hyponatremia- hold lasix and give IVF- improved 5. Anemia- watch- stable around 8.5- post op blood loss?   Code Status: full Family Communication: daughter at bedside Disposition Plan: home on sunday   Consultants:  none  Procedures:  none  Antibiotics:  none  HPI/Subjective: Patient feeling well today Has been up walking to the bathroom  Objective: Filed Vitals:   01/10/13 1840 01/10/13 2300 01/11/13 0551 01/11/13 0601  BP: 119/68 109/47 119/61 100/73  Pulse: 76 86 77 83  Temp: 98.1 F (36.7 C) 98.4 F (36.9 C) 98.5 F (36.9 C) 98.7 F (37.1 C)  TempSrc:  Oral Oral Oral  Resp: 18 20 22 24   Height:      Weight:      SpO2: 95% 100% 100% 100%    Intake/Output Summary (Last 24 hours) at 01/11/13 0805 Last data filed at 01/11/13 0548  Gross per 24 hour  Intake    695 ml  Output   1000 ml  Net   -305 ml   Filed Weights   01/10/13 1555 01/10/13 1557  Weight: 78 kg (171 lb 15.3 oz) 76.856 kg (169 lb 7 oz)    Exam:   General:  A+Ox3, NAD  Cardiovascular: rrr  Respiratory: clear anterior, no wheezing  Abdomen: +BS, soft, NT/ND  Musculoskeletal: moves all 4 ext   Data Reviewed: Basic Metabolic Panel:  Recent Labs Lab 01/10/13 1349 01/11/13 0607  NA 131* 135  K 4.2 4.1  CL 94* 100  CO2 27 29  GLUCOSE 105* 98  BUN 12 11  CREATININE 0.70 0.69  CALCIUM 9.0 8.6   Liver Function Tests: No results  found for this basename: AST, ALT, ALKPHOS, BILITOT, PROT, ALBUMIN,  in the last 168 hours No results found for this basename: LIPASE, AMYLASE,  in the last 168 hours No results found for this basename: AMMONIA,  in the last 168 hours CBC:  Recent Labs Lab 01/05/13 0412 01/06/13 0405 01/10/13 1349 01/11/13 0607  WBC 13.0* 13.4* 12.2* 11.2*  NEUTROABS  --   --  7.9*  --   HGB 8.2* 8.6* 8.8* 8.3*  HCT 25.0* 27.3* 28.3* 26.4*  MCV 85.6 85.8 85.8 86.0  PLT 298 322 493* 452*   Cardiac Enzymes:  Recent Labs Lab 01/10/13 1922 01/10/13 2333 01/11/13 0607  TROPONINI <0.30 <0.30 <0.30   BNP (last 3 results) No results found for this basename: PROBNP,  in the last 8760 hours CBG: No results found for this basename: GLUCAP,  in the last 168 hours  No results found for this or any previous visit (from the past 240 hour(s)).   Studies: Dg Chest 2 View  01/10/2013  *RADIOLOGY REPORT*  Clinical Data: Shortness of breath, history of atrial fibrillation  CHEST - 2 VIEW  Comparison: None.  Findings: Cardiomegaly is noted.  Minimal lower thoracic dextroscoliosis.  No acute infiltrate or pleural effusion.  No pulmonary edema.  Bony thorax is unremarkable.  IMPRESSION: No active  disease.  Cardiomegaly.   Original Report Authenticated By: Natasha Mead, M.D.    Ct Angio Chest Pe W/cm &/or Wo Cm  01/10/2013  *RADIOLOGY REPORT*  Clinical Data: Hip replacement 01/07/2013.  Short of breath., back pain  CT ANGIOGRAPHY CHEST  Technique:  Multidetector CT imaging of the chest using the standard protocol during bolus administration of intravenous contrast. Multiplanar reconstructed images including MIPs were obtained and reviewed to evaluate the vascular anatomy.  Contrast: 1 OMNIPAQUE IOHEXOL 300 MG/ML  SOLN, 80mL OMNIPAQUE IOHEXOL 350 MG/ML SOLN seen  Comparison: Chest radiograph 01/10/2013  Findings: Within the right lower lobe posterior segment pulmonary artery there is a tubular filling defect (images  149-156, series 10) consistent with acute pulmonary embolism.  There are no additional filling defects within the pulmonary arteries. No acute findings of the aorta or great vessels.  There is no pericardial fluid.  Mild coronary calcifications are noted.  Esophagus is normal.  Review of the lung parenchyma demonstrates no pulmonary infarction. No pneumonia pleural fluid.  No pneumothorax.  Limited view of the upper abdomen is unremarkable.  Limited view of the skeleton demonstrates degenerative spurring of the thoracic spine appear  IMPRESSION: 1.  Acute pulmonary embolism within a segmental branch of the right lower lobe.  Overall clot burden is minimal. 2.  No evidence of pulmonary infarction, pneumonia, or pneumothorax.  Critical results were conveyed to Dr. Manus Gunning on 01/10/2013 at 1540 hours hours   Original Report Authenticated By: Genevive Bi, M.D.     Scheduled Meds: . aspirin  325 mg Oral Q breakfast  . atorvastatin  10 mg Oral q1800  . digoxin  0.25 mg Oral q morning - 10a  . diltiazem  240 mg Oral q morning - 10a  . ferrous sulfate  325 mg Oral Q breakfast  . rivaroxaban  20 mg Oral Daily  . sodium chloride  3 mL Intravenous Q12H   Continuous Infusions: . heparin 1,100 Units/hr (01/11/13 0600)    Active Problems:   SOB (shortness of breath)   Pulmonary emboli   EKG, abnormal   Hyponatremia   Anemia    Time spent: 25    [facility], JESSICA  [facility] Pager [phone]. If 7PM-7AM, please contact night-coverage at [email or web address], password [facility] 01/11/2013, 8:05 AM  LOS: 1 day

## 2013-01-11 NOTE — Progress Notes (Signed)
  Pharmacy Note (Brief) - Heparin --> Xarelto  62 yo F with RLL PE on IV heparin (started 3/21). Plan now is to change to PO Xarelto. SCr 0.69, CrCl > 50 ml/min. H/H low but stable. Spoke with RN who knows to give the Xarelto at the time the heparin drip is stopped.  1) Stop heparin at 090:59am 2) At 10am - give first dose of Xarelto 3) For PE, Xarelto dosing is 15mg  PO BID x3 weeks, then change to Xarelto 20mg  daily. 4) Pharmacy will sign off.  Darrol Angel, PharmD Pager: [phone] 01/11/2013 8:48 AM

## 2013-01-11 NOTE — Progress Notes (Signed)
Patient had 10 beats of V-tach at 05:49 this am.  Patient had no complaints and vitals were stable.  Notified NP on call via text page. No return call.  Will continue to monitor patient. Manson Passey, Kishaun Erekson Cherie

## 2013-01-11 NOTE — Evaluation (Signed)
Physical Therapy One-Time Evaluation Patient Details Name: Samantha Stevenson MRN: [id number] DOB: 1951-04-27 Today's Date: 01/11/2013 Time: 4098-1191 PT Time Calculation (min): 26 min  PT Assessment / Plan / Recommendation Clinical Impression  Patient admitted with PE following left THA and d/c home.  Presents without acute PT needs states getting around in room with walker independently.  Would recommend nursing to walk with patient tomorrow if not d/c home.  Has leg length discrepancy which causes pain on right leg per patient and hopes to get shoe lift upon follow up with MD.    PT Assessment  All further PT needs can be met in the next venue of care    Follow Up Recommendations  Home health PT       Barriers to Discharge None      Equipment Recommendations  None recommended by PT          Precautions / Restrictions Precautions Precautions: Fall   Pertinent Vitals/Pain Denies pain currently      Mobility  Bed Mobility Supine to Sit: 6: Modified independent (Device/Increase time);HOB elevated Sit to Supine: 6: Modified independent (Device/Increase time);HOB elevated Details for Bed Mobility Assistance: pt able to get leg onto bed assisting with hands Transfers Sit to Stand: 6: Modified independent (Device/Increase time) Stand to Sit: 6: Modified independent (Device/Increase time) Ambulation/Gait Ambulation/Gait Assistance: 6: Modified independent (Device/Increase time) Ambulation Distance (Feet): 600 Feet Assistive device: Rolling walker Ambulation/Gait Assistance Details: toe weight bearing on right due to leg length discrepancy Gait Pattern: Step-through pattern;Trunk flexed Stairs: Yes Stairs Assistance: 4: Min assist;5: Supervision Stairs Assistance Details (indicate cue type and reason): assist with hand held to ascend (has cane at home) with both rails to descend.  Cues to remind about sequence Stair Management Technique: One rail Right;Two rails;Step to  pattern;Forwards Number of Stairs: 12    Exercises Other Exercises Other Exercises: demonstrated one rep of each of her exercises, states been doing them while here.      Visit Information  Last PT Received On: 01/11/13 Assistance Needed: +1    Subjective Data  Subjective: Thought I wasn't going to make it.  Breathing much better. Patient Stated Goal: To return home   Prior Functioning  Home Living Lives With: Family Available Help at Discharge: Family;Available PRN/intermittently Type of Home: House Home Access: Stairs to enter [employer] of Steps: 10 Entrance Stairs-Rails: Right;Left;Can reach both Home Layout: One level Bathroom Shower/Tub: Health visitor: Standard Bathroom Accessibility: Yes Home Adaptive Equipment: Straight cane;Walker - rolling;Shower chair with back Prior Function Level of Independence: Independent with assistive device(s) Able to Take Stairs?: Yes Communication Communication: No difficulties    Cognition  Cognition Overall Cognitive Status: Appears within functional limits for tasks assessed/performed Arousal/Alertness: Awake/alert Orientation Level: Appears intact for tasks assessed Behavior During Session: [facility] for tasks performed    Extremity/Trunk Assessment Right Upper Extremity Assessment RUE ROM/Strength/Tone: [facility] for tasks assessed Left Upper Extremity Assessment LUE ROM/Strength/Tone: WFL for tasks assessed Right Lower Extremity Assessment RLE ROM/Strength/Tone: [facility] for tasks assessed Left Lower Extremity Assessment LLE ROM/Strength/Tone: Deficits LLE ROM/Strength/Tone Deficits: functional weakness using hands to assist in and out of bed with c/o anterior pain with ambulation in groin; grossly 2+/5   Balance Balance Balance Assessed: No  End of Session PT - End of Session Equipment Utilized During Treatment: Gait belt Activity Tolerance: Patient tolerated treatment well Patient left: in bed;with call  bell/phone within reach;with family/visitor present  GP     [facility] 01/11/2013, 3:26 PM  [place], [place] [phone] 01/11/2013

## 2013-01-11 NOTE — Progress Notes (Signed)
Pt's sats on room air are 99%.  Her sats while amubating are 97%.

## 2013-01-12 DIAGNOSIS — R0602 Shortness of breath: Secondary | ICD-10-CM

## 2013-01-12 LAB — BASIC METABOLIC PANEL
BUN: 9 mg/dL (ref 6–23)
CO2: 28 mEq/L (ref 19–32)
Calcium: 8.9 mg/dL (ref 8.4–10.5)
Chloride: 100 mEq/L (ref 96–112)
Creatinine, Ser: 0.66 mg/dL (ref 0.50–1.10)
GFR calc Af Amer: >90 mL/min (ref >90)
GFR calc non Af Amer: >90 mL/min (ref >90)
Glucose, Bld: 108 mg/dL — ABNORMAL HIGH (ref 70–99)
Potassium: 4.1 mEq/L (ref 3.5–5.1)
Sodium: 137 mEq/L (ref 135–145)

## 2013-01-12 LAB — CBC
HCT: 24.4 % — ABNORMAL LOW (ref 36.0–46.0)
Hemoglobin: 7.7 g/dL — ABNORMAL LOW (ref 12.0–15.0)
MCH: 27 pg (ref 26.0–34.0)
MCHC: 31.6 g/dL (ref 30.0–36.0)
MCV: 85.6 fL (ref 78.0–100.0)
Platelets: 448 10*3/uL — ABNORMAL HIGH (ref 150–400)
RBC: 2.85 MIL/uL — ABNORMAL LOW (ref 3.87–5.11)
RDW: 15.6 % — ABNORMAL HIGH (ref 11.5–15.5)
WBC: 8.8 10*3/uL (ref 4.0–10.5)

## 2013-01-12 MED ORDER — DSS 100 MG PO CAPS: 100.0000 mg | ORAL_CAPSULE | Freq: Two times a day (BID) | ORAL | Status: DC

## 2013-01-12 MED ORDER — RIVAROXABAN 15 MG PO TABS: ORAL_TABLET | ORAL | Status: DC

## 2013-01-12 MED ORDER — DOCUSATE SODIUM 100 MG PO CAPS
100.0000 mg | ORAL_CAPSULE | Freq: Two times a day (BID) | ORAL | Status: DC
  Administered 2013-01-12: 100 mg via ORAL
  Filled 2013-01-12 (×2): qty 1

## 2013-01-12 NOTE — ED Provider Notes (Signed)
Medical screening examination/treatment/procedure(s) were conducted as a shared visit with non-physician practitioner(s) and myself.  I personally evaluated the patient during the encounter  1 week s/p hip arthoplasty with SOB. Minimal chest pain. NO distress, but anxious, lungs clear, L hip incision clean. R/o PE.    EMERGENCY DEPARTMENT [place] CARDIAC EXAM "Study: Limited Ultrasound of the heart and pericardium"  INDICATIONS:Hypotension and Dyspnea Multiple views of the heart and pericardium are obtained with a multi-frequency probe.  PERFORMED RU:[id number]  IMAGES ARCHIVED?: No  FINDINGS: No pericardial effusion and Normal contractility  LIMITATIONS:    VIEWS USED: Subcostal 4 chamber and Apical 4 chamber   INTERPRETATION: Cardiac activity present, Pericardial effusioin absent, Cardiac tamponade absent, Volume status normal and Normal contractility  COMMENT:  No R heart strain.  Glynn Octave, MD 01/12/13 1302

## 2013-01-12 NOTE — Discharge Summary (Signed)
Physician Discharge Summary  Daneen Volcy ZOX:[id number] DOB: Mar 11, 1951 DOA: 01/10/2013  PCP: Enrique Sack, MD  Admit date: 01/10/2013 Discharge date: 01/12/2013  Time spent: 35 minutes  Recommendations for Outpatient Follow-up:  1. For PE treatment duration 2. CBC re Hgb 1 week  Discharge Diagnoses:  Active Problems:   SOB (shortness of breath)   Pulmonary emboli   EKG, abnormal   Hyponatremia   Anemia   Discharge Condition: improved  Diet recommendation: cardiac  Filed Weights   01/10/13 1555 01/10/13 1557  Weight: 78 kg (171 lb 15.3 oz) 76.856 kg (169 lb 7 oz)    History of present illness:  Samantha Stevenson is a 62 y.o. female  Who was just discharged from the hospital after hip replacement surgery. Patient was medicated prior ro my arrival and is unable to give information- daughter relays history. Patient was c/o SOB before discharge on Tuesday after surgery. Her SOB has worsened over the last few days. She also had chest pain left side around should last PM. And right posterior pain. Worse with deep breaths.  She told the ER Dr that she has a sensation of her throat closing up. She has been using albuterol at home with no relief of her SOB  In the ER, she was found to have a PE on CTA. A bedside echo was done but did not show any right heart strain. Heparin was started in the ER by the ER doctor.   Hospital Course:  1. PE- oral anticoagulation today. Duplex LE b/L negative- most like this is a provoked event from the surgery- paged on call ortho to be sure patient can be anticoagulated from their standpoint- no call back- patient is > 1 week post op 2. SOB- improved- no hypoxia with ambulation 3. Abnormal EKG-  CE negative 4. Hyponatremia-  improved 5. Anemia- watch-    Procedures:  Bedside echo in ER: no RHS  Consultations:  none  Discharge Exam: Filed Vitals:   01/11/13 0601 01/11/13 1418 01/11/13 2210 01/12/13 0431  BP: 100/73 113/63 113/60  123/58  Pulse: 83 83 76 84  Temp: 98.7 F (37.1 C) 98.2 F (36.8 C) 98 F (36.7 C) 97.8 F (36.6 C)  TempSrc: Oral Oral Oral Oral  Resp: 24 22 22 20   Height:      Weight:      SpO2: 100% 100% 99% 100%    General: A+Ox3, NAD Cardiovascular: rrr Respiratory: clear anterior  Discharge Instructions  Discharge Orders   Future Orders Complete By Expires     Diet - low sodium heart healthy  As directed     Discharge instructions  As directed     Comments:      Resume home health PT CBC within 1 week of D/C- continue Iron    Increase activity slowly  As directed         Medication List    STOP taking these medications       aspirin 325 MG EC tablet      TAKE these medications       digoxin 0.25 MG tablet  Commonly known as:  LANOXIN  Take 0.25 mg by mouth every morning.     diltiazem 240 MG 24 hr capsule  Commonly known as:  DILACOR XR  Take 240 mg by mouth every morning.     ferrous sulfate 325 (65 FE) MG tablet  Take 325 mg by mouth daily with breakfast.     furosemide 20 MG tablet  Commonly known as:  LASIX  Take 20 mg by mouth daily as needed (for fluid).     methocarbamol 500 MG tablet  Commonly known as:  ROBAXIN  Take 500 mg by mouth every 6 (six) hours as needed (muscle spasm).     multivitamin tablet  Take 1 tablet by mouth daily.     oxyCODONE-acetaminophen 5-325 MG per tablet  Commonly known as:  PERCOCET/ROXICET  Take 1-2 tablets by mouth every 4 (four) hours as needed for pain.     pravastatin 40 MG tablet  Commonly known as:  PRAVACHOL  Take 40 mg by mouth every morning.     Rivaroxaban 15 MG Tabs tablet  Commonly known as:  XARELTO  15 mg PO BID for 21 days (then 20 mg daily)     Roller Walker Misc  Weight bearing as tolerated          The results of significant diagnostics from this hospitalization (including imaging, microbiology, ancillary and laboratory) are listed below for reference.    Significant Diagnostic  Studies: Dg Chest 2 View  01/10/2013  *RADIOLOGY REPORT*  Clinical Data: Shortness of breath, history of atrial fibrillation  CHEST - 2 VIEW  Comparison: None.  Findings: Cardiomegaly is noted.  Minimal lower thoracic dextroscoliosis.  No acute infiltrate or pleural effusion.  No pulmonary edema.  Bony thorax is unremarkable.  IMPRESSION: No active disease.  Cardiomegaly.   Original Report Authenticated By: Natasha Mead, M.D.    Dg Chest 2 View  12/31/2012  *RADIOLOGY REPORT*  Clinical Data: Preop left hip surgery  CHEST - 2 VIEW  Comparison: None.  Findings: Normal mediastinum and cardiac silhouette.  Normal pulmonary  vasculature.  No evidence of effusion, infiltrate, or pneumothorax.  No acute bony abnormality.  Mild spine scoliosis .  IMPRESSION: No acute cardiopulmonary process.   Original Report Authenticated By: Genevive Bi, M.D.    Dg Hip Complete Left  01/03/2013  *RADIOLOGY REPORT*  Clinical Data: Left hip replacement.  LEFT HIP - COMPLETE 2+ VIEW  Comparison: 08/25/2009  Findings: Two intraoperative spot images demonstrate changes of left hip replacement.  No hardware or bony complicating feature. Normal AP alignment.  IMPRESSION: Left hip replacement without visible complicating feature.   Original Report Authenticated By: Charlett Nose, M.D.    Ct Angio Chest Pe W/cm &/or Wo Cm  01/10/2013  *RADIOLOGY REPORT*  Clinical Data: Hip replacement 01/07/2013.  Short of breath., back pain  CT ANGIOGRAPHY CHEST  Technique:  Multidetector CT imaging of the chest using the standard protocol during bolus administration of intravenous contrast. Multiplanar reconstructed images including MIPs were obtained and reviewed to evaluate the vascular anatomy.  Contrast: 1 OMNIPAQUE IOHEXOL 300 MG/ML  SOLN, 80mL OMNIPAQUE IOHEXOL 350 MG/ML SOLN seen  Comparison: Chest radiograph 01/10/2013  Findings: Within the right lower lobe posterior segment pulmonary artery there is a tubular filling defect (images 149-156,  series 10) consistent with acute pulmonary embolism.  There are no additional filling defects within the pulmonary arteries. No acute findings of the aorta or great vessels.  There is no pericardial fluid.  Mild coronary calcifications are noted.  Esophagus is normal.  Review of the lung parenchyma demonstrates no pulmonary infarction. No pneumonia pleural fluid.  No pneumothorax.  Limited view of the upper abdomen is unremarkable.  Limited view of the skeleton demonstrates degenerative spurring of the thoracic spine appear  IMPRESSION: 1.  Acute pulmonary embolism within a segmental branch of the right lower lobe.  Overall clot burden  is minimal. 2.  No evidence of pulmonary infarction, pneumonia, or pneumothorax.  Critical results were conveyed to Dr. Manus Gunning on 01/10/2013 at 1540 hours hours   Original Report Authenticated By: Genevive Bi, M.D.    Dg Pelvis Portable  01/03/2013  *RADIOLOGY REPORT*  Clinical Data: Hip replacement.  PORTABLE PELVIS  Comparison: None.  Findings: The patient has a new left total hip arthroplasty.  Gas in the soft tissues from surgery is noted.  The device is located. There is no fracture.  Mild right hip degenerative change is noted.  IMPRESSION: Left hip replacement without complicating features.   Original Report Authenticated By: Holley Dexter, M.D.    Dg Hip Portable 1 View Left  01/03/2013  *RADIOLOGY REPORT*  Clinical Data: Left hip replacement.  PORTABLE LEFT HIP - 1 VIEW  Comparison: None.  Findings: The patient has a new left total hip arthroplasty.  The device is located and no fracture is identified.  Gas in the soft tissues is noted.  IMPRESSION: Left total hip without evidence of complication.   Original Report Authenticated By: Holley Dexter, M.D.    Dg C-arm 61-120 Min-no Report  01/03/2013  CLINICAL DATA: left total hip   C-ARM 61-120 MINUTES  Fluoroscopy was utilized by the requesting physician.  No radiographic  interpretation.       Microbiology: No results found for this or any previous visit (from the past 240 hour(s)).   Labs: Basic Metabolic Panel:  Recent Labs Lab 01/10/13 1349 01/11/13 0607 01/12/13 0525  NA 131* 135 137  K 4.2 4.1 4.1  CL 94* 100 100  CO2 27 29 28   GLUCOSE 105* 98 108*  BUN 12 11 9   CREATININE 0.70 0.69 0.66  CALCIUM 9.0 8.6 8.9  MG  --  2.2  --    Liver Function Tests: No results found for this basename: AST, ALT, ALKPHOS, BILITOT, PROT, ALBUMIN,  in the last 168 hours No results found for this basename: LIPASE, AMYLASE,  in the last 168 hours No results found for this basename: AMMONIA,  in the last 168 hours CBC:  Recent Labs Lab 01/06/13 0405 01/10/13 1349 01/11/13 0607 01/12/13 0525  WBC 13.4* 12.2* 11.2* 8.8  NEUTROABS  --  7.9*  --   --   HGB 8.6* 8.8* 8.3* 7.7*  HCT 27.3* 28.3* 26.4* 24.4*  MCV 85.8 85.8 86.0 85.6  PLT 322 493* 452* 448*   Cardiac Enzymes:  Recent Labs Lab 01/10/13 1922 01/10/13 2333 01/11/13 0607  TROPONINI <0.30 <0.30 <0.30   BNP: BNP (last 3 results) No results found for this basename: PROBNP,  in the last 8760 hours CBG: No results found for this basename: GLUCAP,  in the last 168 hours     Signed:  Marlin Canary  [facility] 01/12/2013, 9:06 AM

## 2013-01-12 NOTE — Care Management Note (Signed)
    Page 1 of 1   01/12/2013     2:36:42 PM   CARE MANAGEMENT NOTE 01/12/2013  Patient:  [facility]   Account Number:  [id number]  Date Initiated:  01/12/2013  Documentation initiated by:  Lanier Clam  Subjective/Objective Assessment:   ADMITTED W/PE.     Action/Plan:   FROM HOME.ACTIVE W/[facility] HHPT.HAS RW.   Anticipated DC Date:  01/12/2013   Anticipated DC Plan:  HOME W HOME HEALTH SERVICES      DC Planning Services  CM consult      [facility] Choice  Resumption Of Svcs/PTA Provider   Choice offered to / List presented to:  C-1 Patient        HH arranged  HH-2 PT      [facility] agency  [facility]   Status of service:  Completed, signed off Medicare Important Message given?   (If response is "NO", the following Medicare IM given date fields will be blank) Date Medicare IM given:   Date Additional Medicare IM given:    Discharge Disposition:  HOME W HOME HEALTH SERVICES  Per UR Regulation:    If discussed at Long Length of Stay Meetings, dates discussed:    Comments:  01/12/13 Wyman Meschke RN,BSN NCM [phone] FAXED/W CONFIRMATION TO [facility] FAX#[phone] HHPT ORDER,D/C SUMMARY,H&P.PROVIDED PATIENT W/DRUG DISCOUNT CARD.HAS PRESCRIPTION COVERAGE, THEREFORE NOT ELIGIBLE FOR MATCH PROGRAM FOR MED ASST.PATIENT VOICED UNDERSTANDING.MD UPDATED.

## 2013-01-12 NOTE — Progress Notes (Signed)
Case Manager saw pt and was unable to advise about Xarelto copay; was given discount card. Pt states she will check with her pharmacy. She does not have her insurance card with her or Clinical research associate (nurse) would attempt to assist her check with the customer service.

## 2013-01-21 ENCOUNTER — Encounter: Payer: Self-pay | Admitting: Internal Medicine

## 2013-01-21 ENCOUNTER — Ambulatory Visit (INDEPENDENT_AMBULATORY_CARE_PROVIDER_SITE_OTHER): Payer: Medicare HMO | Admitting: Internal Medicine

## 2013-01-21 VITALS — BP 112/70 | HR 70 | Temp 98.7°F | Ht 64.0 in | Wt 168.0 lb

## 2013-01-21 DIAGNOSIS — I2699 Other pulmonary embolism without acute cor pulmonale: Secondary | ICD-10-CM

## 2013-01-21 NOTE — Progress Notes (Signed)
  Subjective:    Patient ID: Samantha Stevenson, female    DOB: [birth year]  MRN: [id number]  HPI  86 yobf never smoker admitted for [facility] 3/14  with dx of pe 3/21  self referred to pulmonary clinic 01/21/2013   01/21/2013 1st pulmonary evaluation cc fatigue but no sob since THR.  CT showed minimal R clot and venous dopplers neg and rx with xarelto > pt requested pulmonary evaluation. No pleuritic cp on R, no hemoptysis. Overall doing very well on xarelto, no bleeding.  Sleeping ok without nocturnal  or early am exacerbation  of respiratory  c/o's or need for noct saba. Also denies any obvious fluctuation of symptoms with weather or environmental changes or other aggravating or alleviating factors except as outlined above    Review of Systems  Constitutional: Negative for fever and unexpected weight change.  HENT: Positive for sore throat, sneezing and dental problem. Negative for ear pain, nosebleeds, congestion, rhinorrhea, trouble swallowing, postnasal drip and sinus pressure.   Eyes: Negative for redness and itching.  Respiratory: Positive for shortness of breath. Negative for cough, chest tightness and wheezing.   Cardiovascular: Positive for chest pain and palpitations. Negative for leg swelling.  Gastrointestinal: Positive for abdominal distention. Negative for nausea and vomiting.  Genitourinary: Negative for dysuria.  Musculoskeletal: Negative for joint swelling.  Skin: Positive for rash.  Neurological: Positive for headaches.  Hematological: Does not bruise/bleed easily.  Psychiatric/Behavioral: Negative for dysphoric mood. The patient is not nervous/anxious.        Objective:   Physical Exam  Wt Readings from Last 3 Encounters:  01/21/13 168 lb (76.204 kg)  01/10/13 169 lb 7 oz (76.856 kg)  01/03/13 172 lb (78.019 kg)     HEENT: nl dentition, turbinates, and orophanx. Nl external ear canals without cough reflex   NECK :  without JVD/Nodes/TM/ nl carotid upstrokes  bilaterally   LUNGS: no acc muscle use, clear to A and P bilaterally without cough on insp or exp maneuvers   CV:  RRR  no s3 or murmur or increase in P2, no edema   ABD:  soft and nontender with nl excursion in the supine position. No bruits or organomegaly, bowel sounds nl  MS:  warm without deformities, calf tenderness, cyanosis or clubbing  SKIN: warm and dry without lesions    NEURO:  alert, approp, no deficits   Cta chest 01/10/13 1. Acute pulmonary embolism within a segmental branch of the right  lower lobe. Overall clot burden is minimal.  2. No evidence of pulmonary infarction, pneumonia, or  pneumothorax.     Assessment & Plan:

## 2013-01-21 NOTE — Patient Instructions (Signed)
Please schedule a follow up office visit in 6 weeks, call sooner if needed unless your primary care has assume your care

## 2013-01-23 NOTE — Assessment & Plan Note (Signed)
-   dx 01/10/13 isolated R segmental with neg venous dopplers one week p orthopedic surgery  With neg fm hx and such a small clot I would not change rec to rx for 6months with xarelto then stop  Discussed in detail all the  indications, usual  risks and alternatives  relative to the benefits with patient who agrees to proceed with xarelto x 6 months as outlined unless bleeding occurs

## 2013-03-04 ENCOUNTER — Ambulatory Visit (INDEPENDENT_AMBULATORY_CARE_PROVIDER_SITE_OTHER): Payer: Medicare HMO | Admitting: Internal Medicine

## 2013-03-04 ENCOUNTER — Encounter: Payer: Self-pay | Admitting: Internal Medicine

## 2013-03-04 ENCOUNTER — Ambulatory Visit: Payer: Medicare HMO | Admitting: Internal Medicine

## 2013-03-04 VITALS — BP 110/72 | HR 85 | Temp 98.3°F | Ht 64.0 in | Wt 162.0 lb

## 2013-03-04 DIAGNOSIS — R05 Cough: Secondary | ICD-10-CM | POA: Insufficient documentation

## 2013-03-04 DIAGNOSIS — R059 Cough, unspecified: Secondary | ICD-10-CM

## 2013-03-04 DIAGNOSIS — I2699 Other pulmonary embolism without acute cor pulmonale: Secondary | ICD-10-CM

## 2013-03-04 MED ORDER — LEVOFLOXACIN 750 MG PO TABS: 750.0000 mg | ORAL_TABLET | Freq: Every day | ORAL | Status: DC

## 2013-03-04 NOTE — Progress Notes (Signed)
  Subjective:    Patient ID: Samantha Stevenson, female    DOB: 02-20-1951  MRN: [id number]  HPI  51 yobf never smoker admitted for [facility] 3/14  with dx of pe 3/21  self referred to pulmonary clinic 01/21/2013   01/21/2013 1st pulmonary evaluation cc fatigue but no sob since THR.  CT showed minimal R clot and venous dopplers neg and rx with xarelto > pt requested pulmonary evaluation. No pleuritic cp on R, no hemoptysis. Overall doing very well on xarelto, no bleeding. rec No change rx   03/04/2013 f/u ov/Chaela Branscum re PE Chief Complaint  Patient presents with  . Follow-up    Pt doing well, denies any co's today.   cough prod green mucus esp in am ever  since surgery x sev tbsp per day total but no sob   No obvious daytime variabilty or assoc   cp or chest tightness, subjective wheeze overt sinus or hb symptoms. No unusual exp hx or h/o childhood pna/ asthma or premature birth to her knowledge.   Sleeping ok without nocturnal  or early am exacerbation  of respiratory  c/o's or need for noct saba. Also denies any obvious fluctuation of symptoms with weather or environmental changes or other aggravating or alleviating factors except as outlined above   Current Medications, Allergies, Past Medical History, Past Surgical History, Family History, and Social History were reviewed in [employer] record.  ROS  The following are not active complaints unless bolded sore throat, dysphagia, dental problems, itching, sneezing,  nasal congestion or excess/ purulent secretions, ear ache,   fever, chills, sweats, unintended wt loss, pleuritic or exertional cp, hemoptysis,  orthopnea pnd or leg swelling, presyncope, palpitations, heartburn, abdominal pain, anorexia, nausea, vomiting, diarrhea  or change in bowel or urinary habits, change in stools or urine, dysuria,hematuria,  rash, arthralgias, visual complaints, headache, numbness weakness or ataxia or problems with walking or coordination,  change  in mood/affect or memory.              Objective:   Physical Exam  03/04/2013        162  Wt Readings from Last 3 Encounters:  01/21/13 168 lb (76.204 kg)  01/10/13 169 lb 7 oz (76.856 kg)  01/03/13 172 lb (78.019 kg)    Pleasant amb bf walks with cane  HEENT: nl dentition, turbinates, and orophanx. Nl external ear canals without cough reflex Top dentures   NECK :  without JVD/Nodes/TM/ nl carotid upstrokes bilaterally   LUNGS: no acc muscle use, clear to A and P bilaterally without cough on insp or exp maneuvers   CV:  RRR  no s3 or murmur or increase in P2, no edema   ABD:  soft and nontender with nl excursion in the supine position. No bruits or organomegaly, bowel sounds nl  MS:  warm without deformities, calf tenderness, cyanosis or clubbing      Cta chest 01/10/13 1. Acute pulmonary embolism within a segmental branch of the right  lower lobe. Overall clot burden is minimal.  2. No evidence of pulmonary infarction, pneumonia, or  pneumothorax.     Assessment & Plan:

## 2013-03-04 NOTE — Assessment & Plan Note (Signed)
The most common causes of chronic cough in immunocompetent adults include the following: upper airway cough syndrome (UACS), previously referred to as postnasal drip syndrome (PNDS), which is caused by variety of rhinosinus conditions; (2) asthma; (3) GERD; (4) chronic bronchitis from cigarette smoking or other inhaled environmental irritants; (5) nonasthmatic eosinophilic bronchitis; and (6) bronchiectasis.   These conditions, singly or in combination, have accounted for up to 94% of the causes of chronic cough in prospective studies.   Other conditions have constituted no >6% of the causes in prospective studies These have included bronchogenic carcinoma, chronic interstitial pneumonia, sarcoidosis, left ventricular failure, ACEI-induced cough, and aspiration from a condition associated with pharyngeal dysfunction.    Chronic cough is often simultaneously caused by more than one condition. A single cause has been found from 38 to 82% of the time, multiple causes from 18 to 62%. Multiply caused cough has been the result of three diseases up to 42% of the time.       Most likely this is a mild sinusitis, pnds problem so try levaquin x 5 days then sinus ct if not improving

## 2013-03-04 NOTE — Assessment & Plan Note (Signed)
-   dx 01/10/13 isolated R segmental with neg venous dopplers one week p orthopedic surgery  Plan to complete xarelto 07/2013 with no further studies needed

## 2013-03-04 NOTE — Patient Instructions (Signed)
Levaquin 750 mg daily x 5 days and then call for sinus CT if still coughing up green mucus  -  Call [facility] [phone]  For cough usie mucinex dm 1200 mg every 12 hours as needed

## 2013-03-12 ENCOUNTER — Ambulatory Visit: Payer: Medicare HMO | Admitting: Family Medicine

## 2013-03-19 ENCOUNTER — Other Ambulatory Visit (HOSPITAL_COMMUNITY): Payer: Self-pay | Admitting: Internal Medicine

## 2013-03-19 DIAGNOSIS — Z1231 Encounter for screening mammogram for malignant neoplasm of breast: Secondary | ICD-10-CM

## 2013-03-31 ENCOUNTER — Ambulatory Visit (HOSPITAL_COMMUNITY)
Admission: RE | Admit: 2013-03-31 | Discharge: 2013-03-31 | Disposition: A | Discharge status: Home / Self Care | Payer: Medicare HMO | Source: Ambulatory Visit | Attending: Internal Medicine | Admitting: Internal Medicine

## 2013-03-31 DIAGNOSIS — Z1231 Encounter for screening mammogram for malignant neoplasm of breast: Secondary | ICD-10-CM | POA: Insufficient documentation

## 2013-03-31 LAB — HM MAMMOGRAPHY

## 2013-04-01 ENCOUNTER — Other Ambulatory Visit: Payer: Self-pay | Admitting: Internal Medicine

## 2013-04-01 DIAGNOSIS — R928 Other abnormal and inconclusive findings on diagnostic imaging of breast: Secondary | ICD-10-CM

## 2013-04-16 ENCOUNTER — Ambulatory Visit
Admission: RE | Admit: 2013-04-16 | Discharge: 2013-04-16 | Disposition: A | Discharge status: Home / Self Care | Payer: Medicare HMO | Source: Ambulatory Visit | Attending: Internal Medicine | Admitting: Internal Medicine

## 2013-04-16 DIAGNOSIS — R928 Other abnormal and inconclusive findings on diagnostic imaging of breast: Secondary | ICD-10-CM

## 2013-04-16 HISTORY — PX: OTHER SURGICAL HISTORY: SHX169

## 2013-04-18 LAB — CBC AND DIFFERENTIAL
HCT: 31 % — AB (ref 36–46)
Hemoglobin: 10.1 g/dL — AB (ref 12.0–16.0)
Platelets: 402 10*3/uL — AB (ref 150–399)
WBC: 6.8 10^3/mL

## 2013-07-16 ENCOUNTER — Encounter: Payer: Self-pay | Admitting: Internal Medicine

## 2013-07-16 ENCOUNTER — Ambulatory Visit (INDEPENDENT_AMBULATORY_CARE_PROVIDER_SITE_OTHER): Payer: Medicare HMO | Admitting: Internal Medicine

## 2013-07-16 VITALS — BP 138/82 | HR 66 | Temp 97.7°F | Ht 64.0 in | Wt 162.0 lb

## 2013-07-16 DIAGNOSIS — I4891 Unspecified atrial fibrillation: Secondary | ICD-10-CM

## 2013-07-16 DIAGNOSIS — Z23 Encounter for immunization: Secondary | ICD-10-CM

## 2013-07-16 DIAGNOSIS — I10 Essential (primary) hypertension: Secondary | ICD-10-CM

## 2013-07-16 DIAGNOSIS — M169 Osteoarthritis of hip, unspecified: Secondary | ICD-10-CM

## 2013-07-16 DIAGNOSIS — I2699 Other pulmonary embolism without acute cor pulmonale: Secondary | ICD-10-CM

## 2013-07-16 DIAGNOSIS — D649 Anemia, unspecified: Secondary | ICD-10-CM

## 2013-07-16 DIAGNOSIS — E785 Hyperlipidemia, unspecified: Secondary | ICD-10-CM

## 2013-07-16 MED ORDER — DIGOXIN 250 MCG PO TABS: 0.2500 mg | ORAL_TABLET | Freq: Every morning | ORAL | Status: DC

## 2013-07-16 MED ORDER — ASPIRIN EC 81 MG PO TBEC: 81.0000 mg | DELAYED_RELEASE_TABLET | Freq: Every day | ORAL | Status: DC

## 2013-07-16 MED ORDER — DILTIAZEM HCL ER 240 MG PO CP24: 240.0000 mg | ORAL_CAPSULE | Freq: Every morning | ORAL | Status: DC

## 2013-07-16 MED ORDER — PRAVASTATIN SODIUM 40 MG PO TABS: 40.0000 mg | ORAL_TABLET | Freq: Every morning | ORAL | Status: DC

## 2013-07-16 NOTE — Patient Instructions (Signed)
It was good to see you today. Your annual flu shot was given and/or updated today. We have reviewed your prior records including labs and tests today Medications reviewed and updated, okay to discontinue Xarelto and resume baby aspirin once daily -no changes recommended at this time. Refill on medication(s) as discussed today. Please schedule followup in mid-November 2014 for your annual exam and labs, call sooner if problems.

## 2013-07-16 NOTE — Progress Notes (Signed)
Subjective:    Patient ID: Samantha Stevenson, female    DOB: [birth year], 62 y.o.   MRN: [id number]  HPI Patient to me and our division, here to establish with primary care provider Reviewed chronic medical issues today  Pulmonary embolism March 2014. Onset one week following her total hip replacement. Diagnosed by CT angiogram emergency room in the setting of shortness of breath. On anticoagulation for 6 months duration, scheduled to be discontinued this month. Has been evaluated by pulmonary for same who agrees with management plans  Dyslipidemia. On pravastatin. the patient reports compliance with medication(s) as prescribed. Denies adverse side effects.  Atrial fibrillation. On diltiazem and digoxin for rate control. No prior anticoagulation, presumably due to low CHADs score - denies stroke or complications related to arrhythmia  Hypertension. On diltiazem - the patient reports compliance with medication(s) as prescribed. Denies adverse side effects.   Past Medical History  Diagnosis Date  . Arthritis   . Hyperlipidemia   . Hypertension   . Anemia   . Myocardial infarction 2000    due to atrial fib  . Atrial fibrillation   . GERD (gastroesophageal reflux disease)   . Pulmonary emboli 12/2012 dx    post op (L THR), anticoag x 28mo  . Degenerative arthritis of hip     s/p L THR 12/2012  . Migraines    Family History  Problem Relation Age of Onset  . Heart disease Father   . Diabetes Mother   . Diabetes Sister   . Alcohol abuse Other   . Heart disease Other   . Hyperlipidemia Other   . Hypertension Other   . Diabetes Other   . Breast cancer Other    History  Substance Use Topics  . Smoking status: Never Smoker   . Smokeless tobacco: Never Used  . Alcohol Use: No    Review of Systems Constitutional: Negative for fever or weight change.  Respiratory: Negative for cough and shortness of breath.   Cardiovascular: Negative for chest pain or palpitations.   Gastrointestinal: Negative for abdominal pain, no bowel changes.  Musculoskeletal: Negative for gait problem or joint swelling.  Skin: Negative for rash.  Neurological: Negative for dizziness or headache.  No other specific complaints in a complete review of systems (except as listed in HPI above).      Objective:   Physical Exam BP 138/82  Pulse 66  Temp(Src) 97.7 F (36.5 C) (Oral)  Ht 5\' 4"  (1.626 m)  Wt 162 lb (73.483 kg)  BMI 27.79 kg/m2  SpO2 97% Wt Readings from Last 3 Encounters:  07/16/13 162 lb (73.483 kg)  03/04/13 162 lb (73.483 kg)  01/21/13 168 lb (76.204 kg)   Constitutional: She appears well-developed and well-nourished. No distress.  HENT: Head: Normocephalic and atraumatic. Ears: B TMs ok, no erythema or effusion; Nose: Nose normal. Mouth/Throat: Oropharynx is clear and moist. No oropharyngeal exudate.  Eyes: Conjunctivae and EOM are normal. Pupils are equal, round, and reactive to light. No scleral icterus.  Neck: Normal range of motion. Neck supple. No JVD present. No thyromegaly present.  Cardiovascular: Normal rate, regular rhythm and normal heart sounds.  No murmur heard. No BLE edema but "fatty" ankles. Pulmonary/Chest: Effort normal and breath sounds normal. No respiratory distress. She has no wheezes.  Abdominal: Soft. Bowel sounds are normal. She exhibits no distension. There is no tenderness. no masses Musculoskeletal: Normal range of motion, no joint effusions. No gross deformities Neurological: She is alert and oriented to person, place, and  time. No cranial nerve deficit. Coordination, balance, strength, speech and gait are normal.  Skin: Skin is warm and dry. No rash noted. No erythema.  Psychiatric: She has a normal mood and affect. Her behavior is normal. Judgment and thought content normal.   Lab Results  Component Value Date   WBC 6.8 04/18/2013   HGB 10.1* 04/18/2013   HCT 31* 04/18/2013   PLT 402* 04/18/2013   GLUCOSE 108* 01/12/2013   CHOL  235* 08/29/2012   TRIG 110 08/29/2012   HDL 36 08/29/2012   LDLCALC 177 08/29/2012   ALT 9 08/29/2012   AST 16 08/29/2012   NA 137 01/12/2013   K 4.1 01/12/2013   CL 100 01/12/2013   CREATININE 0.66 01/12/2013   BUN 9 01/12/2013   CO2 28 01/12/2013   INR 1.11 01/10/2013       Assessment & Plan:   See problem list. Medications and labs reviewed today.  Time spent with pt today 45 minutes, greater than 50% time spent counseling patient on PE dx 12/2012, OA/DJD, AF and medication review. Also review of prior records sent from prior PCP today

## 2013-07-16 NOTE — Assessment & Plan Note (Signed)
Diagnosis March 2014 one week following total hip replacement.  Diagnosis by CT angio and recommend six-month anticoagulation. We'll discontinue Xarelto at this time and resume aspirin 81 mg daily as taken prior to diagnosis Patient understands to avoid hormone replacement therapy and will use adequate precautions with anticoagulation following any future surgical intervention

## 2013-07-16 NOTE — Assessment & Plan Note (Signed)
Not on anticoagulation prior to treatment for PE, but calculated CHADs score of 2 ( hypertension, female) Patient wishes to discontinue Xarelto at this time following 6 month treatment for PE, resume aspirin 81 mg daily and will continue to discuss risk versus benefit of chronic anticoagulation in the setting of A. Fib at  future visits Continue diltiazem rate control with dig

## 2013-07-16 NOTE — Assessment & Plan Note (Signed)
On pravastatin Will check lipids annually at visit in November, continue same for now, plan hydration as needed for goal LDL less than 100

## 2013-07-16 NOTE — Assessment & Plan Note (Signed)
BP Readings from Last 3 Encounters:  07/16/13 138/82  03/04/13 110/72  01/21/13 112/70   The current medical regimen is effective;  continue present plan and medications.

## 2013-07-16 NOTE — Assessment & Plan Note (Signed)
Total hip replacement left side March 2014 reviewed Takes Tylenol and tramadol as needed Follows with orthopedics Dr. Magnus Ivan Interval history reviewed, continue management as ongoing

## 2013-07-16 NOTE — Assessment & Plan Note (Signed)
Chronic history iron deficiency, GI evaluation past unremarkable per patient report Exacerbated by postop loss March 2014 Discontinuing anticoagulation now, resume aspirin 81mg  Plan recheck CBC with iron level at next visit November 2014 Encourage compliance with over-the-counter iron as previously prescribed

## 2013-07-25 ENCOUNTER — Encounter: Payer: Self-pay | Admitting: Internal Medicine

## 2013-08-27 ENCOUNTER — Other Ambulatory Visit (INDEPENDENT_AMBULATORY_CARE_PROVIDER_SITE_OTHER): Payer: Medicare HMO

## 2013-08-27 ENCOUNTER — Ambulatory Visit (INDEPENDENT_AMBULATORY_CARE_PROVIDER_SITE_OTHER): Payer: Medicare HMO | Admitting: Internal Medicine

## 2013-08-27 ENCOUNTER — Encounter: Payer: Self-pay | Admitting: Internal Medicine

## 2013-08-27 VITALS — BP 128/80 | HR 50 | Temp 97.4°F | Wt 162.8 lb

## 2013-08-27 DIAGNOSIS — Z0000 Encounter for general adult medical examination without abnormal findings: Secondary | ICD-10-CM

## 2013-08-27 DIAGNOSIS — E785 Hyperlipidemia, unspecified: Secondary | ICD-10-CM

## 2013-08-27 DIAGNOSIS — D649 Anemia, unspecified: Secondary | ICD-10-CM

## 2013-08-27 DIAGNOSIS — I4891 Unspecified atrial fibrillation: Secondary | ICD-10-CM

## 2013-08-27 DIAGNOSIS — I10 Essential (primary) hypertension: Secondary | ICD-10-CM

## 2013-08-27 LAB — BASIC METABOLIC PANEL
BUN: 16 mg/dL (ref 6–23)
CO2: 28 mEq/L (ref 19–32)
Calcium: 9 mg/dL (ref 8.4–10.5)
Chloride: 105 mEq/L (ref 96–112)
Creatinine, Ser: 0.7 mg/dL (ref 0.4–1.2)
GFR: 95.21 mL/min (ref >60.00)
Glucose, Bld: 93 mg/dL (ref 70–99)
Potassium: 4 mEq/L (ref 3.5–5.1)
Sodium: 140 mEq/L (ref 135–145)

## 2013-08-27 LAB — CBC WITH DIFFERENTIAL/PLATELET
Basophils Absolute: 0 10*3/uL (ref 0.0–0.1)
Basophils Relative: 0.4 % (ref 0.0–3.0)
Eosinophils Absolute: 0.2 10*3/uL (ref 0.0–0.7)
Eosinophils Relative: 2.7 % (ref 0.0–5.0)
HCT: 32.1 % — ABNORMAL LOW (ref 36.0–46.0)
Hemoglobin: 10.5 g/dL — ABNORMAL LOW (ref 12.0–15.0)
Lymphocytes Relative: 41.3 % (ref 12.0–46.0)
Lymphs Abs: 2.8 10*3/uL (ref 0.7–4.0)
MCHC: 32.7 g/dL (ref 30.0–36.0)
MCV: 82.7 fl (ref 78.0–100.0)
Monocytes Absolute: 0.4 10*3/uL (ref 0.1–1.0)
Monocytes Relative: 5.9 % (ref 3.0–12.0)
Neutro Abs: 3.4 10*3/uL (ref 1.4–7.7)
Neutrophils Relative %: 49.7 % (ref 43.0–77.0)
Platelets: 324 10*3/uL (ref 150.0–400.0)
RBC: 3.88 Mil/uL (ref 3.87–5.11)
RDW: 16.3 % — ABNORMAL HIGH (ref 11.5–14.6)
WBC: 6.8 10*3/uL (ref 4.5–10.5)

## 2013-08-27 LAB — HEPATIC FUNCTION PANEL
ALT: 11 U/L (ref 0–35)
AST: 16 U/L (ref 0–37)
Albumin: 3.3 g/dL — ABNORMAL LOW (ref 3.5–5.2)
Alkaline Phosphatase: 95 U/L (ref 39–117)
Bilirubin, Direct: 0.1 mg/dL (ref 0.0–0.3)
Total Bilirubin: 0.4 mg/dL (ref 0.3–1.2)
Total Protein: 7 g/dL (ref 6.0–8.3)

## 2013-08-27 LAB — LIPID PANEL
Cholesterol: 194 mg/dL (ref 0–200)
HDL: 38.3 mg/dL — ABNORMAL LOW (ref >39.00)
LDL Cholesterol: 120 mg/dL — ABNORMAL HIGH (ref 0–99)
Total CHOL/HDL Ratio: 5
Triglycerides: 179 mg/dL — ABNORMAL HIGH (ref 0.0–149.0)
VLDL: 35.8 mg/dL (ref 0.0–40.0)

## 2013-08-27 LAB — TSH: TSH: 2.24 u[IU]/mL (ref 0.35–5.50)

## 2013-08-27 LAB — FERRITIN: Ferritin: 19.3 ng/mL (ref 10.0–291.0)

## 2013-08-27 MED ORDER — ASPIRIN EC 81 MG PO TBEC: 162.0000 mg | DELAYED_RELEASE_TABLET | Freq: Every day | ORAL | Status: DC

## 2013-08-27 NOTE — Assessment & Plan Note (Signed)
Chronic history iron deficiency, GI evaluation past unremarkable per patient report Exacerbated by postop loss March 2014 Discontinued anticoagulation 06/2013, resume aspirin 81mg  recheck CBC with iron level now Encourage compliance with over-the-counter iron as previously prescribed

## 2013-08-27 NOTE — Assessment & Plan Note (Addendum)
Not on anticoagulation prior to treatment for PE 2014, but calculated CHADs score of 2 ( hypertension, female) Patient discontinued Xarelto 06/2013 following 6 month treatment for PE change aspirin to 162 mg daily (declines 325 due to stomach upset) will continue to discuss risk versus benefit of chronic anticoagulation in the setting of A. Fib at  future visits Continue diltiazem rate control with dig Consider future reestablish with cards (remote eval by Elsie Lincoln)

## 2013-08-27 NOTE — Assessment & Plan Note (Signed)
On pravastatin check lipids annually at visit in November continue same for now, plan titration as needed for goal LDL less than 100

## 2013-08-27 NOTE — Assessment & Plan Note (Signed)
BP Readings from Last 3 Encounters:  08/27/13 128/80  07/16/13 138/82  03/04/13 110/72   The current medical regimen is effective;  continue present plan and medications.

## 2013-08-27 NOTE — Patient Instructions (Addendum)
It was good to see you today.  Health Maintenance reviewed - all recommended immunizations and age-appropriate screenings are up-to-date.  Test(s) ordered today. Your results will be released to MyChart (or called to you) after review, usually within 72hours after test completion. If any changes need to be made, you will be notified at that same time.  Medications reviewed and updated, increase aspirin to 325 mg daily. No other changes  Please schedule followup in 6 months, call sooner if problems.  Health Maintenance, Females A healthy lifestyle and preventative care can promote health and wellness.  Maintain regular health, dental, and eye exams.  Eat a healthy diet. Foods like vegetables, fruits, whole grains, low-fat dairy products, and lean protein foods contain the nutrients you need without too many calories. Decrease your intake of foods high in solid fats, added sugars, and salt. Get information about a proper diet from your caregiver, if necessary.  Regular physical exercise is one of the most important things you can do for your health. Most adults should get at least 150 minutes of moderate-intensity exercise (any activity that increases your heart rate and causes you to sweat) each week. In addition, most adults need muscle-strengthening exercises on 2 or more days a week.   Maintain a healthy weight. The body mass index (BMI) is a screening tool to identify possible weight problems. It provides an estimate of body fat based on height and weight. Your caregiver can help determine your BMI, and can help you achieve or maintain a healthy weight. For adults 20 years and older:  A BMI below 18.5 is considered underweight.  A BMI of 18.5 to 24.9 is normal.  A BMI of 25 to 29.9 is considered overweight.  A BMI of 30 and above is considered obese.  Maintain normal blood lipids and cholesterol by exercising and minimizing your intake of saturated fat. Eat a balanced diet with  plenty of fruits and vegetables. Blood tests for lipids and cholesterol should begin at age 20 and be repeated every 5 years. If your lipid or cholesterol levels are high, you are over 50, or you are a high risk for heart disease, you may need your cholesterol levels checked more frequently.Ongoing high lipid and cholesterol levels should be treated with medicines if diet and exercise are not effective.  If you smoke, find out from your caregiver how to quit. If you do not use tobacco, do not start.  If you are pregnant, do not drink alcohol. If you are breastfeeding, be very cautious about drinking alcohol. If you are not pregnant and choose to drink alcohol, do not exceed 1 drink per day. One drink is considered to be 12 ounces (355 mL) of beer, 5 ounces (148 mL) of wine, or 1.5 ounces (44 mL) of liquor.  Avoid use of street drugs. Do not share needles with anyone. Ask for help if you need support or instructions about stopping the use of drugs.  High blood pressure causes heart disease and increases the risk of stroke. Blood pressure should be checked at least every 1 to 2 years. Ongoing high blood pressure should be treated with medicines, if weight loss and exercise are not effective.  If you are 83 to 60 years old, ask your caregiver if you should take aspirin to prevent strokes.  Diabetes screening involves taking a blood sample to check your fasting blood sugar level. This should be done once every 3 years, after age 10, if you are within normal weight and  without risk factors for diabetes. Testing should be considered at a younger age or be carried out more frequently if you are overweight and have at least 1 risk factor for diabetes.  Breast cancer screening is essential preventative care for women. You should practice "breast self-awareness." This means understanding the normal appearance and feel of your breasts and may include breast self-examination. Any changes detected, no matter how  small, should be reported to a caregiver. Women in their 89s and 30s should have a clinical breast exam (CBE) by a caregiver as part of a regular health exam every 1 to 3 years. After age 37, women should have a CBE every year. Starting at age 31, women should consider having a mammogram (breast X-ray) every year. Women who have a family history of breast cancer should talk to their caregiver about genetic screening. Women at a high risk of breast cancer should talk to their caregiver about having an MRI and a mammogram every year.  The Pap test is a screening test for cervical cancer. Women should have a Pap test starting at age 93. Between ages 62 and 63, Pap tests should be repeated every 2 years. Beginning at age 61, you should have a Pap test every 3 years as long as the past 3 Pap tests have been normal. If you had a hysterectomy for a problem that was not cancer or a condition that could lead to cancer, then you no longer need Pap tests. If you are between ages 72 and 54, and you have had normal Pap tests going back 10 years, you no longer need Pap tests. If you have had past treatment for cervical cancer or a condition that could lead to cancer, you need Pap tests and screening for cancer for at least 20 years after your treatment. If Pap tests have been discontinued, risk factors (such as a new sexual partner) need to be reassessed to determine if screening should be resumed. Some women have medical problems that increase the chance of getting cervical cancer. In these cases, your caregiver may recommend more frequent screening and Pap tests.  The human papillomavirus (HPV) test is an additional test that may be used for cervical cancer screening. The HPV test looks for the virus that can cause the cell changes on the cervix. The cells collected during the Pap test can be tested for HPV. The HPV test could be used to screen women aged 42 years and older, and should be used in women of any age who have  unclear Pap test results. After the age of 69, women should have HPV testing at the same frequency as a Pap test.  Colorectal cancer can be detected and often prevented. Most routine colorectal cancer screening begins at the age of 44 and continues through age 46. However, your caregiver may recommend screening at an earlier age if you have risk factors for colon cancer. On a yearly basis, your caregiver may provide home test kits to check for hidden blood in the stool. Use of a small camera at the end of a tube, to directly examine the colon (sigmoidoscopy or colonoscopy), can detect the earliest forms of colorectal cancer. Talk to your caregiver about this at age 76, when routine screening begins. Direct examination of the colon should be repeated every 5 to 10 years through age 4, unless early forms of pre-cancerous polyps or small growths are found.  Hepatitis C blood testing is recommended for all people born from [birth year] through 1963  and any individual with known risks for hepatitis C.  Practice safe sex. Use condoms and avoid high-risk sexual practices to reduce the spread of sexually transmitted infections (STIs). Sexually active women aged 44 and younger should be checked for Chlamydia, which is a common sexually transmitted infection. Older women with new or multiple partners should also be tested for Chlamydia. Testing for other STIs is recommended if you are sexually active and at increased risk.  Osteoporosis is a disease in which the bones lose minerals and strength with aging. This can result in serious bone fractures. The risk of osteoporosis can be identified using a bone density scan. Women ages 12 and over and women at risk for fractures or osteoporosis should discuss screening with their caregivers. Ask your caregiver whether you should be taking a calcium supplement or vitamin D to reduce the rate of osteoporosis.  Menopause can be associated with physical symptoms and risks. Hormone  replacement therapy is available to decrease symptoms and risks. You should talk to your caregiver about whether hormone replacement therapy is right for you.  Use sunscreen with a sun protection factor (SPF) of 30 or greater. Apply sunscreen liberally and repeatedly throughout the day. You should seek shade when your shadow is shorter than you. Protect yourself by wearing long sleeves, pants, a wide-brimmed hat, and sunglasses year round, whenever you are outdoors.  Notify your caregiver of new moles or changes in moles, especially if there is a change in shape or color. Also notify your caregiver if a mole is larger than the size of a pencil eraser.  Stay current with your immunizations. Document Released: 04/24/2011 Document Revised: 01/01/2012 Document Reviewed: 04/24/2011 [facility] Patient Information 2014 [place], [place].

## 2013-08-27 NOTE — Progress Notes (Signed)
Pre-visit discussion using our clinic review tool. No additional management support is needed unless otherwise documented below in the visit note.  

## 2013-08-27 NOTE — Progress Notes (Signed)
Subjective:    Patient ID: Samantha Stevenson, female    DOB: 1951-06-05, 62 y.o.   MRN: [id number]  HPI  patient is here today for annual physical. Patient feels well and has no complaints.  Also reviewed chronic medical issues today  Pulmonary embolism March 2014. Onset one week following her total hip replacement. Diagnosed by CT angiogram emergency room in the setting of shortness of breath. On anticoagulation for 6 months duration, discontinued Xarelto late 06/2013. s/p evaluation by pulmonary for same who agreed with management plans  Dyslipidemia. On pravastatin. the patient reports compliance with medication(s) as prescribed. Denies adverse side effects.  Atrial fibrillation. On diltiazem and digoxin for rate control. No prior anticoagulation, presumably due to low CHADs score (in past)- denies stroke or complications related to arrhythmia  Hypertension. On diltiazem - the patient reports compliance with medication(s) as prescribed. Denies adverse side effects.   Past Medical History  Diagnosis Date  . Arthritis   . Hyperlipidemia   . Hypertension   . Anemia   . Myocardial infarction 2000    due to atrial fib  . Atrial fibrillation   . GERD (gastroesophageal reflux disease)   . Pulmonary emboli 12/2012 dx    post op (L THR), anticoag x 63mo  . Degenerative arthritis of hip     s/p L THR 12/2012  . Migraines   . Dyslipidemia   . Hypertension    Family History  Problem Relation Age of Onset  . Heart disease Father   . Diabetes Mother   . Diabetes Sister   . Alcohol abuse Other   . Heart disease Other   . Hyperlipidemia Other   . Hypertension Other   . Diabetes Other   . Breast cancer Other    History  Substance Use Topics  . Smoking status: Never Smoker   . Smokeless tobacco: Never Used  . Alcohol Use: No    Review of Systems  Constitutional: Negative for fatigue and unexpected weight change.  Respiratory: Negative for cough, shortness of breath and  wheezing.   Cardiovascular: Negative for chest pain, palpitations and leg swelling.  Gastrointestinal: Negative for nausea, abdominal pain and diarrhea.  Neurological: Negative for dizziness, weakness, light-headedness and headaches.  Psychiatric/Behavioral: Negative for dysphoric mood. The patient is not nervous/anxious.   All other systems reviewed and are negative.         Objective:   Physical Exam BP 128/80  Pulse 50  Temp(Src) 97.4 F (36.3 C) (Oral)  Wt 162 lb 12.8 oz (73.846 kg)  SpO2 93% Wt Readings from Last 3 Encounters:  08/27/13 162 lb 12.8 oz (73.846 kg)  07/16/13 162 lb (73.483 kg)  03/04/13 162 lb (73.483 kg)   Constitutional: She appears well-developed and well-nourished. No distress.  HENT: Head: Normocephalic and atraumatic. Ears: B TMs ok, no erythema or effusion; Nose: Nose normal. Mouth/Throat: Oropharynx is clear and moist. No oropharyngeal exudate.  Eyes: Conjunctivae and EOM are normal. Pupils are equal, round, and reactive to light. No scleral icterus.  Neck: Normal range of motion. Neck supple. No JVD present. No thyromegaly present.  Cardiovascular: Normal rate, regular rhythm and normal heart sounds.  No murmur heard. No BLE edema but "fatty" ankles. Pulmonary/Chest: Effort normal and breath sounds normal. No respiratory distress. She has no wheezes.  Abdominal: Soft. Bowel sounds are normal. She exhibits no distension. There is no tenderness. no masses Musculoskeletal: Normal range of motion, no joint effusions. No gross deformities Neurological: She is alert and oriented to  person, place, and time. No cranial nerve deficit. Coordination, balance, strength, speech and gait are normal.  Skin: Skin is warm and dry. No rash noted. No erythema.  Psychiatric: She has a normal mood and affect. Her behavior is normal. Judgment and thought content normal.   Lab Results  Component Value Date   WBC 6.8 04/18/2013   HGB 10.1* 04/18/2013   HCT 31* 04/18/2013    PLT 402* 04/18/2013   GLUCOSE 108* 01/12/2013   CHOL 235* 08/29/2012   TRIG 110 08/29/2012   HDL 36 08/29/2012   LDLCALC 177 08/29/2012   ALT 9 08/29/2012   AST 16 08/29/2012   NA 137 01/12/2013   K 4.1 01/12/2013   CL 100 01/12/2013   CREATININE 0.66 01/12/2013   BUN 9 01/12/2013   CO2 28 01/12/2013   INR 1.11 01/10/2013       Assessment & Plan:   AWV/CPX/v70.0 - Today patient counseled on age appropriate routine health concerns for screening and prevention, each reviewed and up to date or declined. Immunizations reviewed and up to date or declined. Labs/ECG reviewed. Risk factors for depression reviewed and negative. Hearing function and visual acuity are intact. ADLs screened and addressed as needed. Functional ability and level of safety reviewed and appropriate. Education, counseling and referrals performed based on assessed risks today. Patient provided with a copy of personalized plan for preventive services.  Also see problem list. Medications and labs reviewed today.

## 2014-02-24 ENCOUNTER — Other Ambulatory Visit (INDEPENDENT_AMBULATORY_CARE_PROVIDER_SITE_OTHER): Payer: Commercial Managed Care - HMO

## 2014-02-24 ENCOUNTER — Encounter: Payer: Self-pay | Admitting: Internal Medicine

## 2014-02-24 ENCOUNTER — Ambulatory Visit (INDEPENDENT_AMBULATORY_CARE_PROVIDER_SITE_OTHER): Payer: Commercial Managed Care - HMO | Admitting: Internal Medicine

## 2014-02-24 VITALS — BP 122/80 | HR 68 | Temp 98.4°F | Wt 169.8 lb

## 2014-02-24 DIAGNOSIS — I4891 Unspecified atrial fibrillation: Secondary | ICD-10-CM

## 2014-02-24 DIAGNOSIS — I10 Essential (primary) hypertension: Secondary | ICD-10-CM

## 2014-02-24 DIAGNOSIS — E785 Hyperlipidemia, unspecified: Secondary | ICD-10-CM

## 2014-02-24 DIAGNOSIS — M7542 Impingement syndrome of left shoulder: Secondary | ICD-10-CM

## 2014-02-24 DIAGNOSIS — M7580 Other shoulder lesions, unspecified shoulder: Secondary | ICD-10-CM

## 2014-02-24 DIAGNOSIS — M25819 Other specified joint disorders, unspecified shoulder: Secondary | ICD-10-CM

## 2014-02-24 LAB — BASIC METABOLIC PANEL
BUN: 15 mg/dL (ref 6–23)
CO2: 30 mEq/L (ref 19–32)
Calcium: 9.2 mg/dL (ref 8.4–10.5)
Chloride: 103 mEq/L (ref 96–112)
Creatinine, Ser: 1 mg/dL (ref 0.4–1.2)
GFR: 62.02 mL/min (ref >60.00)
Glucose, Bld: 92 mg/dL (ref 70–99)
Potassium: 3.9 mEq/L (ref 3.5–5.1)
Sodium: 141 mEq/L (ref 135–145)

## 2014-02-24 MED ORDER — FUROSEMIDE 20 MG PO TABS: 20.0000 mg | ORAL_TABLET | Freq: Every day | ORAL | Status: DC

## 2014-02-24 NOTE — Assessment & Plan Note (Signed)
Not on anticoagulation prior to treatment for PE 2014, but calculated CHADs score of 2 (hypertension, female) Patient discontinued Xarelto 06/2013 following 6 month treatment for PE 08/2013 changed aspirin to 162 mg daily (declines 325mg  due to stomach upset) will continue to discuss risk versus benefit of chronic anticoagulation in the setting of A. Fib at  future visits Continue diltiazem rate control with dig Consider future reestablish with cards (remote eval by Melvern Banker)

## 2014-02-24 NOTE — Assessment & Plan Note (Signed)
On pravastatin check lipids annually each November Last lipids reviewed continue same for now

## 2014-02-24 NOTE — Assessment & Plan Note (Signed)
BP Readings from Last 3 Encounters:  02/24/14 122/80  08/27/13 128/80  07/16/13 138/82   Reports good control of dep edema with use of low dose furosemide and would like to take daily, esp with summer heat Will rx for same  Check lytes now to see if supplemental Kcl also needed The current medical regimen is effective;  continue present plan and medications.

## 2014-02-24 NOTE — Progress Notes (Signed)
Pre visit review using our clinic review tool, if applicable. No additional management support is needed unless otherwise documented below in the visit note. 

## 2014-02-24 NOTE — Patient Instructions (Addendum)
It was good to see you today.  We have reviewed your prior records including labs and tests today  Test(s) ordered today. Your results will be released to [place] (or called to you) after review, usually within 72hours after test completion. If any changes need to be made, you will be notified at that same time.  Medications reviewed and updated, ok to take lasix everyday for blood pressure and fluid control -no other changes recommended at this time.  Please schedule followup in 6 months for annual exam and labs, call sooner if problems.   Joint Injection Care After Refer to this sheet in the next few days. These instructions provide you with information on caring for yourself after you have had a joint injection. Your caregiver also may give you more specific instructions. Your treatment has been planned according to current medical practices, but problems sometimes occur. Call your caregiver if you have any problems or questions after your procedure. After any type of joint injection, it is not uncommon to experience:  Soreness, swelling, or bruising around the injection site.  Mild numbness, tingling, or weakness around the injection site caused by the numbing medicine used before or with the injection. It also is possible to experience the following effects associated with the specific agent after injection:  Iodine-based contrast agents:  Allergic reaction (itching, hives, widespread redness, and swelling beyond the injection site).  Corticosteroids (These effects are rare.):  Allergic reaction.  Increased blood sugar levels (If you have diabetes and you notice that your blood sugar levels have increased, notify your caregiver).  Increased blood pressure levels.  Mood swings.  Hyaluronic acid in the use of viscosupplementation.  Temporary heat or redness.  Temporary rash and itching.  Increased fluid accumulation in the injected joint. These effects all should resolve  within a day after your procedure.  HOME CARE INSTRUCTIONS  Limit yourself to light activity the day of your procedure. Avoid lifting heavy objects, bending, stooping, or twisting.  Take prescription or over-the-counter pain medication as directed by your caregiver.  You may apply ice to your injection site to reduce pain and swelling the day of your procedure. Ice may be applied 03-04 times:  Put ice in a plastic bag.  Place a towel between your skin and the bag.  Leave the ice on for no longer than 15-20 minutes each time. SEEK IMMEDIATE MEDICAL CARE IF:   Pain and swelling get worse rather than better or extend beyond the injection site.  Numbness does not go away.  Blood or fluid continues to leak from the injection site.  You have chest pain.  You have swelling of your face or tongue.  You have trouble breathing or you become dizzy.  You develop a fever, chills, or severe tenderness at the injection site that last longer than 1 day. MAKE SURE YOU:  Understand these instructions.  Watch your condition.  Get help right away if you are not doing well or if you get worse. Document Released: 06/22/2011 Document Revised: 01/01/2012 Document Reviewed: 06/22/2011 [facility] Patient Information 2014 [place].

## 2014-02-24 NOTE — Progress Notes (Signed)
Subjective:    Patient ID: Samantha Stevenson, female    DOB: 01-Sep-1951, 63 y.o.   MRN: [id number]  HPI  Patient is here for follow up  Reviewed chronic medical issues and interval medical events  Past Medical History  Diagnosis Date  . Arthritis   . Hyperlipidemia   . Hypertension   . Anemia   . Myocardial infarction 2000    due to atrial fib  . Atrial fibrillation   . GERD (gastroesophageal reflux disease)   . Pulmonary emboli 12/2012 dx    post op (L THR), anticoag x 47mo  . Degenerative arthritis of hip     s/p L THR 12/2012  . Migraines   . Dyslipidemia   . Hypertension     Review of Systems  Respiratory: Negative for cough and shortness of breath.   Cardiovascular: Positive for leg swelling (daily mild dep ankle edema, worse with heat/summer). Negative for chest pain.  Musculoskeletal: Positive for arthralgias (L shoulder x 3 mo, no injury, worst lying on L side at night).       Objective:   Physical Exam  BP 122/80  Pulse 68  Temp(Src) 98.4 F (36.9 C) (Oral)  Wt 169 lb 12.8 oz (77.021 kg)  SpO2 94% Wt Readings from Last 3 Encounters:  02/24/14 169 lb 12.8 oz (77.021 kg)  08/27/13 162 lb 12.8 oz (73.846 kg)  07/16/13 162 lb (73.483 kg)   Constitutional: She is overweight, but appears well-developed and well-nourished. No distress.  Neck: Normal range of motion. Neck supple. No JVD present. No thyromegaly present.  Cardiovascular: Normal rate, irregular rhythm and normal heart sounds.  No murmur heard. No BLE edema, but fatty ankles Pulmonary/Chest: Effort normal and breath sounds normal. No respiratory distress. She has no wheezes.  MSkel: L Shoulder: Full range of motion. Neurovascularly intact distally. Good strength with stress of rotator cuff but causes pain. Positive impingement signs. Psychiatric: She has a normal mood and affect. Her behavior is normal. Judgment and thought content normal.   Lab Results  Component Value Date   WBC 6.8 08/27/2013     HGB 10.5* 08/27/2013   HCT 32.1* 08/27/2013   PLT 324.0 08/27/2013   GLUCOSE 93 08/27/2013   CHOL 194 08/27/2013   TRIG 179.0* 08/27/2013   HDL 38.30* 08/27/2013   LDLCALC 120* 08/27/2013   ALT 11 08/27/2013   AST 16 08/27/2013   NA 140 08/27/2013   K 4.0 08/27/2013   CL 105 08/27/2013   CREATININE 0.7 08/27/2013   BUN 16 08/27/2013   CO2 28 08/27/2013   TSH 2.24 08/27/2013   INR 1.11 01/10/2013    US Breast Left  04/16/2013   *RADIOLOGY REPORT*  Clinical Data:  Recall from screening mammography.  DIGITAL DIAGNOSTIC LEFT BREAST MAMMOGRAM  AND LEFT BREAST ULTRASOUND:  Comparison:  Previous examinations.  Findings:  ACR Breast Density Category c: The breast tissue is heterogeneously dense.  Additional spot compression views of the left breast do not demonstrate persistent distortion.  The appearance noted on the screening study is most consistent with a summation shadow.  On physical exam, there is no discrete palpable abnormality within the upper-outer quadrant left breast.  Ultrasound is performed, showing dense but normal-appearing fibroglandular tissue.  There is no mass, distortion, or worrisome shadowing.  IMPRESSION: No findings worrisome for malignancy.  The appearance noted on the screening study is most consistent with a summation shadow.  RECOMMENDATION: Bilateral screening mammography in 1 year.  I have discussed the  findings and recommendations with the patient. Results were also provided in writing at the conclusion of the visit.  If applicable, a reminder letter will be sent to the patient regarding the next appointment.  BI-RADS CATEGORY 1:  Negative.   Original Report Authenticated By: Altamese Cabal, M.D.   Cedro L  04/16/2013   *RADIOLOGY REPORT*  Clinical Data:  Recall from screening mammography.  DIGITAL DIAGNOSTIC LEFT BREAST MAMMOGRAM  AND LEFT BREAST ULTRASOUND:  Comparison:  Previous examinations.  Findings:  ACR Breast Density Category c: The breast tissue is  heterogeneously dense.  Additional spot compression views of the left breast do not demonstrate persistent distortion.  The appearance noted on the screening study is most consistent with a summation shadow.  On physical exam, there is no discrete palpable abnormality within the upper-outer quadrant left breast.  Ultrasound is performed, showing dense but normal-appearing fibroglandular tissue.  There is no mass, distortion, or worrisome shadowing.  IMPRESSION: No findings worrisome for malignancy.  The appearance noted on the screening study is most consistent with a summation shadow.  RECOMMENDATION: Bilateral screening mammography in 1 year.  I have discussed the findings and recommendations with the patient. Results were also provided in writing at the conclusion of the visit.  If applicable, a reminder letter will be sent to the patient regarding the next appointment.  BI-RADS CATEGORY 1:  Negative.   Original Report Authenticated By: Altamese Cabal, M.D.     Procedure note - L shoulder injection, subacromial Indication - impingement syndrome After obtaining verbal informed consent, using sterile technique throughout, shoulder was injected posterior lateral into the subacromial space with 1:3 cc Depo-Medrol 40mg /ml: 1% lidocaine without epi. Patient tolerated procedure well, no complications. Postinjection instructions given: Ice 24-48 hours at injection site, heat thereafter, maintaining motion. Patient to call if any concerns.     Assessment & Plan:   L shoulder impingement - unable to take NSAIDs due to allergy but has tolerated steroid injections for hip/knee well in past - injection today as above - home exercises provided  Problem List Items Addressed This Visit   Atrial fibrillation     Not on anticoagulation prior to treatment for PE 2014, but calculated CHADs score of 2 (hypertension, female) Patient discontinued Xarelto 06/2013 following 6 month treatment for PE 08/2013 changed  aspirin to 162 mg daily (declines 325mg  due to stomach upset) will continue to discuss risk versus benefit of chronic anticoagulation in the setting of A. Fib at  future visits Continue diltiazem rate control with dig Consider future reestablish with cards (remote eval by Melvern Banker)    Relevant Medications      furosemide (LASIX) tablet   Dyslipidemia     On pravastatin check lipids annually each November Last lipids reviewed continue same for now    Hypertension - Primary      BP Readings from Last 3 Encounters:  02/24/14 122/80  08/27/13 128/80  07/16/13 138/82   Reports good control of dep edema with use of low dose furosemide and would like to take daily, esp with summer heat Will rx for same  Check lytes now to see if supplemental Kcl also needed The current medical regimen is effective;  continue present plan and medications.     Relevant Medications      furosemide (LASIX) tablet   Other Relevant Orders      Basic metabolic panel    Other Visit Diagnoses   Impingement syndrome of left shoulder

## 2014-02-25 ENCOUNTER — Telehealth: Payer: Self-pay | Admitting: Internal Medicine

## 2014-02-25 NOTE — Telephone Encounter (Signed)
Relevant patient education assigned to patient using Emmi. ° °

## 2014-04-01 ENCOUNTER — Telehealth: Payer: Self-pay | Admitting: *Deleted

## 2014-04-01 DIAGNOSIS — Z1239 Encounter for other screening for malignant neoplasm of breast: Secondary | ICD-10-CM

## 2014-04-01 NOTE — Telephone Encounter (Signed)
Refer to [facility] done as asked

## 2014-04-01 NOTE — Telephone Encounter (Signed)
Left msg on vm stating she need a referral to have her mammogram done. [facility] but pt states she doesn't want to go back there she is wanting to have mammogram done at [facility]...Johny Chess

## 2014-04-02 NOTE — Telephone Encounter (Signed)
Notified pt referral has been place will received call back with appt, date & time once referral has been set up...Samantha Stevenson

## 2014-04-07 ENCOUNTER — Telehealth: Payer: Self-pay | Admitting: *Deleted

## 2014-04-07 DIAGNOSIS — R239 Unspecified skin changes: Secondary | ICD-10-CM

## 2014-04-07 DIAGNOSIS — L659 Nonscarring hair loss, unspecified: Secondary | ICD-10-CM

## 2014-04-07 NOTE — Telephone Encounter (Signed)
Pt left msg on vm stating pt is needing a referral to see dermatologist Dr. Rinaldo Ratel for hair loss & skin issues...Samantha Stevenson

## 2014-04-08 NOTE — Telephone Encounter (Signed)
Refer ordered as requested

## 2014-04-08 NOTE — Telephone Encounter (Signed)
Notified pt referral has been place will received call back once appt has been set-up by our [facility]...Samantha Stevenson

## 2014-04-16 LAB — HM MAMMOGRAPHY

## 2014-04-21 ENCOUNTER — Encounter: Payer: Self-pay | Admitting: *Deleted

## 2014-08-07 ENCOUNTER — Other Ambulatory Visit: Payer: Self-pay

## 2014-09-22 ENCOUNTER — Other Ambulatory Visit: Payer: Self-pay | Admitting: Internal Medicine

## 2014-10-01 ENCOUNTER — Other Ambulatory Visit: Payer: Self-pay | Admitting: Internal Medicine

## 2014-10-02 ENCOUNTER — Other Ambulatory Visit: Payer: Self-pay | Admitting: Internal Medicine

## 2014-10-14 ENCOUNTER — Other Ambulatory Visit: Payer: Self-pay | Admitting: Internal Medicine

## 2014-10-15 NOTE — Telephone Encounter (Signed)
Ok to fill rx printed and signed thanks

## 2015-02-04 ENCOUNTER — Ambulatory Visit: Payer: Commercial Managed Care - HMO | Admitting: Internal Medicine

## 2015-03-17 ENCOUNTER — Ambulatory Visit (INDEPENDENT_AMBULATORY_CARE_PROVIDER_SITE_OTHER): Payer: Commercial Managed Care - HMO | Admitting: Internal Medicine

## 2015-03-17 ENCOUNTER — Encounter: Payer: Self-pay | Admitting: Internal Medicine

## 2015-03-17 VITALS — BP 150/98 | HR 88 | Temp 98.0°F | Resp 12 | Wt 169.8 lb

## 2015-03-17 DIAGNOSIS — M791 Myalgia: Secondary | ICD-10-CM

## 2015-03-17 DIAGNOSIS — K5900 Constipation, unspecified: Secondary | ICD-10-CM | POA: Diagnosis not present

## 2015-03-17 DIAGNOSIS — M609 Myositis, unspecified: Secondary | ICD-10-CM

## 2015-03-17 DIAGNOSIS — IMO0001 Reserved for inherently not codable concepts without codable children: Secondary | ICD-10-CM | POA: Insufficient documentation

## 2015-03-17 MED ORDER — DOCUSATE SODIUM 100 MG PO TABS: 100.0000 mg | ORAL_TABLET | Freq: Two times a day (BID) | ORAL | Status: DC

## 2015-03-17 MED ORDER — DICLOFENAC SODIUM 1 % TD GEL: 2.0000 g | Freq: Three times a day (TID) | TRANSDERMAL | Status: DC | PRN

## 2015-03-17 NOTE — Patient Instructions (Signed)
We will try a cream that has medicine in it to fight the inflammation in the muscle. You can use it up to 3 times a day for the pain. We think that this is a muscle pain and can take 2-4 weeks to resolve once you start treatment.   For the stomach we have sent in colace (docusate) which is a medicine that I want you to take twice a day to help keep things easier to pass and not as much like pebbles. Work on drinking 6-8 glasses of water or coffee or tea per day to help as well. You can keep using the miralax if you need it as well.

## 2015-03-17 NOTE — Progress Notes (Signed)
   Subjective:    Patient ID: Samantha Stevenson, female    DOB: 01/05/1951, 64 y.o.   MRN: [id number]  HPI The patient is a 64 YO female who is coming in today for right shoulder and neck pain. This is a new problem started 2-3 weeks ago and getting worse. She does not remember any injury, trauma, or overuse. She has had limitation of the ROM of her neck due to pain. Has not tried anything for it. Has been trying to exercise her shoulders to keep them loose. No numbness or tingling. No chest or jaw pain. No SOB or nausea.   She is also having problems with constipation. She has been struggling with it since her hip surgery. It has been stable. Still moving her Stevenson but small pebbles that she occasionally has to strain to pass. She will sometimes take miralax which helps but then she stops. Drinks a lot of water daily. Up to date on colonoscopy. Mild bloating at times and discomfort when she is full. Denies nausea or vomiting but feels it limits her appetite.   Review of Systems  Constitutional: Negative for fever, activity change, appetite change, fatigue and unexpected weight change.  Respiratory: Negative.   Cardiovascular: Negative.   Gastrointestinal: Positive for constipation and abdominal distention. Negative for abdominal pain, diarrhea and blood in stool.       Bloating from constipation  Musculoskeletal: Positive for myalgias, arthralgias and neck pain.  Skin: Negative.       Objective:   Physical Exam  Constitutional: She appears well-developed and well-nourished.  HENT:  Head: Normocephalic and atraumatic.  Eyes: EOM are normal.  Neck: No thyromegaly present.  ROM mostly intact but slightly limited when looking to the right. Able to touch ear halfway to shoulder with minimal pain.   Cardiovascular: Normal rate and regular rhythm.   Pulmonary/Chest: Effort normal and breath sounds normal.  Abdominal: Soft. Bowel sounds are normal. She exhibits no distension. There is no  tenderness. There is no rebound.  Musculoskeletal: She exhibits tenderness.  Tenderness over the right side of the neck into the shoulder and over the scapula on the right. No spinal tenderness in the cervical or thoracic region.   Lymphadenopathy:    She has no cervical adenopathy.   Filed Vitals:   03/17/15 0935  BP: 150/98  Pulse: 88  Temp: 98 F (36.7 C)  TempSrc: Oral  Resp: 12  Weight: 169 lb 12.8 oz (77.021 kg)  SpO2: 94%      Assessment & Plan:

## 2015-03-17 NOTE — Assessment & Plan Note (Signed)
Voltaren gel for the pain and advised to try heating pad. If no improvement in 2 weeks return to clinic. Likely muscular spasm and strain in the scapular region. Talked to her about exercising once better to prevent recurrence. No direct injury or overuse.

## 2015-03-17 NOTE — Progress Notes (Signed)
Pre visit review using our clinic review tool, if applicable. No additional management support is needed unless otherwise documented below in the visit note. 

## 2015-03-17 NOTE — Assessment & Plan Note (Signed)
Possibly started with her opiates after hip surgery. She also still has tramadol on her list which can be related. Given rx for colace and advised to take daily. Make sure to keep fiber in her diet and 8 glasses of water or liquids during the day. Miralax as needed if she gets backed up and talked to her about the safety of daily usage if needed to keep her regular. She will try and call back with questions. Up to date on colonoscopy and no indication for referral to GI at this time.

## 2015-05-01 LAB — HM MAMMOGRAPHY

## 2015-05-13 ENCOUNTER — Encounter: Payer: Self-pay | Admitting: Internal Medicine

## 2015-05-31 ENCOUNTER — Ambulatory Visit: Payer: Commercial Managed Care - HMO | Admitting: Internal Medicine

## 2015-06-10 ENCOUNTER — Other Ambulatory Visit: Payer: Self-pay | Admitting: Internal Medicine

## 2015-06-23 ENCOUNTER — Ambulatory Visit (INDEPENDENT_AMBULATORY_CARE_PROVIDER_SITE_OTHER): Payer: Commercial Managed Care - HMO | Admitting: Internal Medicine

## 2015-06-23 ENCOUNTER — Encounter: Payer: Self-pay | Admitting: Internal Medicine

## 2015-06-23 ENCOUNTER — Other Ambulatory Visit (INDEPENDENT_AMBULATORY_CARE_PROVIDER_SITE_OTHER): Payer: Commercial Managed Care - HMO

## 2015-06-23 VITALS — BP 142/100 | HR 60 | Temp 98.5°F | Resp 14 | Ht 64.0 in | Wt 169.1 lb

## 2015-06-23 DIAGNOSIS — I4891 Unspecified atrial fibrillation: Secondary | ICD-10-CM

## 2015-06-23 DIAGNOSIS — D500 Iron deficiency anemia secondary to blood loss (chronic): Secondary | ICD-10-CM

## 2015-06-23 DIAGNOSIS — I10 Essential (primary) hypertension: Secondary | ICD-10-CM

## 2015-06-23 DIAGNOSIS — Z0000 Encounter for general adult medical examination without abnormal findings: Secondary | ICD-10-CM

## 2015-06-23 LAB — COMPREHENSIVE METABOLIC PANEL
ALT: 9 U/L (ref 0–35)
AST: 13 U/L (ref 0–37)
Albumin: 3.9 g/dL (ref 3.5–5.2)
Alkaline Phosphatase: 101 U/L (ref 39–117)
BUN: 13 mg/dL (ref 6–23)
CO2: 30 mEq/L (ref 19–32)
Calcium: 9 mg/dL (ref 8.4–10.5)
Chloride: 104 mEq/L (ref 96–112)
Creatinine, Ser: 0.71 mg/dL (ref 0.40–1.20)
GFR: 88.51 mL/min (ref >60.00)
Glucose, Bld: 97 mg/dL (ref 70–99)
Potassium: 4 mEq/L (ref 3.5–5.1)
Sodium: 139 mEq/L (ref 135–145)
Total Bilirubin: 0.5 mg/dL (ref 0.2–1.2)
Total Protein: 7.5 g/dL (ref 6.0–8.3)

## 2015-06-23 LAB — CBC
HCT: 34.3 % — ABNORMAL LOW (ref 36.0–46.0)
Hemoglobin: 11 g/dL — ABNORMAL LOW (ref 12.0–15.0)
MCHC: 32 g/dL (ref 30.0–36.0)
MCV: 85.3 fl (ref 78.0–100.0)
Platelets: 382 10*3/uL (ref 150.0–400.0)
RBC: 4.03 Mil/uL (ref 3.87–5.11)
RDW: 15.3 % (ref 11.5–15.5)
WBC: 6.4 10*3/uL (ref 4.0–10.5)

## 2015-06-23 LAB — LIPID PANEL
Cholesterol: 234 mg/dL — ABNORMAL HIGH (ref 0–200)
HDL: 40 mg/dL (ref >39.00)
LDL Cholesterol: 173 mg/dL — ABNORMAL HIGH (ref 0–99)
NonHDL: 194.35
Total CHOL/HDL Ratio: 6
Triglycerides: 107 mg/dL (ref 0.0–149.0)
VLDL: 21.4 mg/dL (ref 0.0–40.0)

## 2015-06-23 LAB — BRAIN NATRIURETIC PEPTIDE: Pro B Natriuretic peptide (BNP): 13 pg/mL (ref 0.0–100.0)

## 2015-06-23 LAB — HEMOGLOBIN A1C: Hgb A1c MFr Bld: 6.1 % (ref 4.6–6.5)

## 2015-06-23 MED ORDER — TRAMADOL HCL 50 MG PO TABS: 50.0000 mg | ORAL_TABLET | Freq: Two times a day (BID) | ORAL | Status: DC | PRN

## 2015-06-23 NOTE — Progress Notes (Signed)
   Subjective:    Patient ID: Samantha Stevenson, female    DOB: 04/06/1951, 64 y.o.   MRN: [id number]  HPI Here for medicare wellness, see below for additional HPI. Please see A/P for status and treatment of chronic medical problems.   Diet: heart healthy Physical activity: sedentary Depression/mood screen: negative Hearing: intact to whispered voice Visual acuity: grossly normal, performs annual eye exam  ADLs: capable Fall risk: none Home safety: good Cognitive evaluation: intact to orientation, naming, recall and repetition EOL planning: adv directives discussed  I have personally reviewed and have noted 1. The patient's medical and social history - reviewed today no changes 2. Their use of alcohol, tobacco or illicit drugs 3. Their current medications and supplements 4. The patient's functional ability including ADL's, fall risks, home safety risks and hearing or visual impairment. 5. Diet and physical activities 6. Evidence for depression or mood disorders 7. Care team reviewed and updated (available in snapshot)  HPI #2: Patient has been having some intermittent chest pains. Last about 5-10 minutes, maybe once per month. Takes tylenol and they resolve. Denies palpitations at the time. Can be at rest or with activity. Used to be more rare but in the last 1 year more frequent. Does take medication for atrial fibrillation but has not been to a cardiologist in some years. Takes her medications daily and if she misses a dose she can tell.   Review of Systems  Constitutional: Negative for fever, activity change, appetite change, fatigue and unexpected weight change.  HENT: Negative.   Eyes: Negative.   Respiratory: Negative for cough, chest tightness, shortness of breath and wheezing.   Cardiovascular: Positive for chest pain and leg swelling. Negative for palpitations.  Gastrointestinal: Negative for nausea, abdominal pain, diarrhea, constipation and abdominal distention.    Musculoskeletal: Positive for myalgias and arthralgias. Negative for back pain and gait problem.  Skin: Negative.   Neurological: Negative.   Psychiatric/Behavioral: Negative.       Objective:   Physical Exam  Constitutional: She is oriented to person, place, and time. She appears well-developed and well-nourished.  HENT:  Head: Normocephalic and atraumatic.  Eyes: EOM are normal.  Neck: Normal range of motion.  Cardiovascular: Normal rate and regular rhythm.   No murmur heard. Pulmonary/Chest: Effort normal and breath sounds normal. No respiratory distress. She has no wheezes. She has no rales.  Abdominal: Soft. Bowel sounds are normal. She exhibits no distension. There is no tenderness. There is no rebound.  Musculoskeletal: She exhibits edema.  1-2+ edema to the mid shin bilaterally, no calf tenderness or asymetry.   Neurological: She is alert and oriented to person, place, and time.  Skin: Skin is warm and dry.  Psychiatric: She has a normal mood and affect.   Filed Vitals:   06/23/15 1000  BP: 142/100  Pulse: 60  Temp: 98.5 F (36.9 C)  TempSrc: Oral  Resp: 14  Height: 5\' 4"  (1.626 m)  Weight: 169 lb 1.9 oz (76.712 kg)  SpO2: 96%   EKG: Rate 51, sinus, left anterior fascicular block, left axis, possible lvh, unchanged from prior.     Assessment & Plan:

## 2015-06-23 NOTE — Progress Notes (Signed)
Pre visit review using our clinic review tool, if applicable. No additional management support is needed unless otherwise documented below in the visit note. 

## 2015-06-23 NOTE — Patient Instructions (Signed)
We have checked the EKG which is without changes but I would still like you to either go back to the heart doctor or get a stress test to make sure the heart is okay.   We are going to check some labs today and call you back with the results.   Think about getting the shingles shot to help decrease your risk of getting shingles.   Health Maintenance Adopting a healthy lifestyle and getting preventive care can go a long way to promote health and wellness. Talk with your health care provider about what schedule of regular examinations is right for you. This is a good chance for you to check in with your provider about disease prevention and staying healthy. In between checkups, there are plenty of things you can do on your own. Experts have done a lot of research about which lifestyle changes and preventive measures are most likely to keep you healthy. Ask your health care provider for more information. WEIGHT AND DIET  Eat a healthy diet  Be sure to include plenty of vegetables, fruits, low-fat dairy products, and lean protein.  Do not eat a lot of foods high in solid fats, added sugars, or salt.  Get regular exercise. This is one of the most important things you can do for your health.  Most adults should exercise for at least 150 minutes each week. The exercise should increase your heart rate and make you sweat (moderate-intensity exercise).  Most adults should also do strengthening exercises at least twice a week. This is in addition to the moderate-intensity exercise.  Maintain a healthy weight  Body mass index (BMI) is a measurement that can be used to identify possible weight problems. It estimates body fat based on height and weight. Your health care provider can help determine your BMI and help you achieve or maintain a healthy weight.  For females 89 years of age and older:   A BMI below 18.5 is considered underweight.  A BMI of 18.5 to 24.9 is normal.  A BMI of 25 to 29.9 is  considered overweight.  A BMI of 30 and above is considered obese.  Watch levels of cholesterol and blood lipids  You should start having your blood tested for lipids and cholesterol at 62 years of age, then have this test every 5 years.  You may need to have your cholesterol levels checked more often if:  Your lipid or cholesterol levels are high.  You are older than 62 years of age.  You are at high risk for heart disease.  CANCER SCREENING   Lung Cancer  Lung cancer screening is recommended for adults 38-70 years old who are at high risk for lung cancer because of a history of smoking.  A yearly low-dose CT scan of the lungs is recommended for people who:  Currently smoke.  Have quit within the past 15 years.  Have at least a 30-pack-year history of smoking. A pack year is smoking an average of one pack of cigarettes a day for 1 year.  Yearly screening should continue until it has been 15 years since you quit.  Yearly screening should stop if you develop a health problem that would prevent you from having lung cancer treatment.  Breast Cancer  Practice breast self-awareness. This means understanding how your breasts normally appear and feel.  It also means doing regular breast self-exams. Let your health care provider know about any changes, no matter how small.  If you are in your  66s or 49s, you should have a clinical breast exam (CBE) by a health care provider every 1-3 years as part of a regular health exam.  If you are 54 or older, have a CBE every year. Also consider having a breast X-ray (mammogram) every year.  If you have a family history of breast cancer, talk to your health care provider about genetic screening.  If you are at high risk for breast cancer, talk to your health care provider about having an MRI and a mammogram every year.  Breast cancer gene (BRCA) assessment is recommended for women who have family members with BRCA-related cancers.  BRCA-related cancers include:  Breast.  Ovarian.  Tubal.  Peritoneal cancers.  Results of the assessment will determine the need for genetic counseling and BRCA1 and BRCA2 testing. Cervical Cancer Routine pelvic examinations to screen for cervical cancer are no longer recommended for nonpregnant women who are considered low risk for cancer of the pelvic organs (ovaries, uterus, and vagina) and who do not have symptoms. A pelvic examination may be necessary if you have symptoms including those associated with pelvic infections. Ask your health care provider if a screening pelvic exam is right for you.   The Pap test is the screening test for cervical cancer for women who are considered at risk.  If you had a hysterectomy for a problem that was not cancer or a condition that could lead to cancer, then you no longer need Pap tests.  If you are older than 65 years, and you have had normal Pap tests for the past 10 years, you no longer need to have Pap tests.  If you have had past treatment for cervical cancer or a condition that could lead to cancer, you need Pap tests and screening for cancer for at least 20 years after your treatment.  If you no longer get a Pap test, assess your risk factors if they change (such as having a new sexual partner). This can affect whether you should start being screened again.  Some women have medical problems that increase their chance of getting cervical cancer. If this is the case for you, your health care provider may recommend more frequent screening and Pap tests.  The human papillomavirus (HPV) test is another test that may be used for cervical cancer screening. The HPV test looks for the virus that can cause cell changes in the cervix. The cells collected during the Pap test can be tested for HPV.  The HPV test can be used to screen women 25 years of age and older. Getting tested for HPV can extend the interval between normal Pap tests from three to  five years.  An HPV test also should be used to screen women of any age who have unclear Pap test results.  After 61 years of age, women should have HPV testing as often as Pap tests.  Colorectal Cancer  This type of cancer can be detected and often prevented.  Routine colorectal cancer screening usually begins at 62 years of age and continues through 62 years of age.  Your health care provider may recommend screening at an earlier age if you have risk factors for colon cancer.  Your health care provider may also recommend using home test kits to check for hidden blood in the stool.  A small camera at the end of a tube can be used to examine your colon directly (sigmoidoscopy or colonoscopy). This is done to check for the earliest forms of colorectal  cancer.  Routine screening usually begins at age 46.  Direct examination of the colon should be repeated every 5-10 years through 62 years of age. However, you may need to be screened more often if early forms of precancerous polyps or small growths are found. Skin Cancer  Check your skin from head to toe regularly.  Tell your health care provider about any new moles or changes in moles, especially if there is a change in a mole's shape or color.  Also tell your health care provider if you have a mole that is larger than the size of a pencil eraser.  Always use sunscreen. Apply sunscreen liberally and repeatedly throughout the day.  Protect yourself by wearing long sleeves, pants, a wide-brimmed hat, and sunglasses whenever you are outside. HEART DISEASE, DIABETES, AND HIGH BLOOD PRESSURE   Have your blood pressure checked at least every 1-2 years. High blood pressure causes heart disease and increases the risk of stroke.  If you are between 93 years and 47 years old, ask your health care provider if you should take aspirin to prevent strokes.  Have regular diabetes screenings. This involves taking a blood sample to check your  fasting blood sugar level.  If you are at a normal weight and have a low risk for diabetes, have this test once every three years after 62 years of age.  If you are overweight and have a high risk for diabetes, consider being tested at a younger age or more often. PREVENTING INFECTION  Hepatitis B  If you have a higher risk for hepatitis B, you should be screened for this virus. You are considered at high risk for hepatitis B if:  You were born in a country where hepatitis B is common. Ask your health care provider which countries are considered high risk.  Your parents were born in a high-risk country, and you have not been immunized against hepatitis B (hepatitis B vaccine).  You have HIV or AIDS.  You use needles to inject street drugs.  You live with someone who has hepatitis B.  You have had sex with someone who has hepatitis B.  You get hemodialysis treatment.  You take certain medicines for conditions, including cancer, organ transplantation, and autoimmune conditions. Hepatitis C  Blood testing is recommended for:  Everyone born from [birth year] through 1963.  Anyone with known risk factors for hepatitis C. Sexually transmitted infections (STIs)  You should be screened for sexually transmitted infections (STIs) including gonorrhea and chlamydia if:  You are sexually active and are younger than 61 years of age.  You are older than 62 years of age and your health care provider tells you that you are at risk for this type of infection.  Your sexual activity has changed since you were last screened and you are at an increased risk for chlamydia or gonorrhea. Ask your health care provider if you are at risk.  If you do not have HIV, but are at risk, it may be recommended that you take a prescription medicine daily to prevent HIV infection. This is called pre-exposure prophylaxis (PrEP). You are considered at risk if:  You are sexually active and do not regularly use condoms or  know the HIV status of your partner(s).  You take drugs by injection.  You are sexually active with a partner who has HIV. Talk with your health care provider about whether you are at high risk of being infected with HIV. If you choose to begin PrEP, you  should first be tested for HIV. You should then be tested every 3 months for as long as you are taking PrEP.  PREGNANCY   If you are premenopausal and you may become pregnant, ask your health care provider about preconception counseling.  If you may become pregnant, take 400 to 800 micrograms (mcg) of folic acid every day.  If you want to prevent pregnancy, talk to your health care provider about birth control (contraception). OSTEOPOROSIS AND MENOPAUSE   Osteoporosis is a disease in which the bones lose minerals and strength with aging. This can result in serious bone fractures. Your risk for osteoporosis can be identified using a bone density scan.  If you are 63 years of age or older, or if you are at risk for osteoporosis and fractures, ask your health care provider if you should be screened.  Ask your health care provider whether you should take a calcium or vitamin D supplement to lower your risk for osteoporosis.  Menopause may have certain physical symptoms and risks.  Hormone replacement therapy may reduce some of these symptoms and risks. Talk to your health care provider about whether hormone replacement therapy is right for you.  HOME CARE INSTRUCTIONS   Schedule regular health, dental, and eye exams.  Stay current with your immunizations.   Do not use any tobacco products including cigarettes, chewing tobacco, or electronic cigarettes.  If you are pregnant, do not drink alcohol.  If you are breastfeeding, limit how much and how often you drink alcohol.  Limit alcohol intake to no more than 1 drink per day for nonpregnant women. One drink equals 12 ounces of beer, 5 ounces of wine, or 1 ounces of hard liquor.  Do  not use street drugs.  Do not share needles.  Ask your health care provider for help if you need support or information about quitting drugs.  Tell your health care provider if you often feel depressed.  Tell your health care provider if you have ever been abused or do not feel safe at home. Document Released: 04/24/2011 Document Revised: 02/23/2014 Document Reviewed: 09/10/2013 [facility] Patient Information 2015 [place], [place]. This information is not intended to replace advice given to you by your health care provider. Make sure you discuss any questions you have with your health care provider.

## 2015-06-24 DIAGNOSIS — Z0000 Encounter for general adult medical examination without abnormal findings: Secondary | ICD-10-CM | POA: Insufficient documentation

## 2015-06-24 LAB — DIGOXIN LEVEL: Digoxin Level: 1.1 ug/L (ref 0.8–2.0)

## 2015-06-24 NOTE — Assessment & Plan Note (Signed)
Checking CBC and adjust therapy as needed.

## 2015-06-24 NOTE — Assessment & Plan Note (Signed)
Mammogram and colonoscopy up to date. Declines immunizations today. Declines need for HIV or hepatitis C screening. Counseled her about skin safety and mole surveillance. 10 year screening recommendations given to her at visit.

## 2015-06-24 NOTE — Assessment & Plan Note (Signed)
BP is up today and has been normal in the past. Will continue diltiazem, digoxin, lasix for pressure and if next elevated change regimen. Checking CMP and adjust as needed.

## 2015-06-24 NOTE — Assessment & Plan Note (Signed)
Checking digoxin level as well as CMP and CBC. Concern for cardiac complication and will refer back to cardiology for evaluation of these chest pains.

## 2015-07-01 ENCOUNTER — Other Ambulatory Visit: Payer: Self-pay | Admitting: Internal Medicine

## 2015-07-01 MED ORDER — ROSUVASTATIN CALCIUM 20 MG PO TABS: 20.0000 mg | ORAL_TABLET | Freq: Every day | ORAL | Status: DC

## 2015-09-23 ENCOUNTER — Telehealth: Payer: Self-pay | Admitting: Geriatric Medicine

## 2015-09-23 ENCOUNTER — Encounter: Payer: Self-pay | Admitting: Internal Medicine

## 2015-09-23 MED ORDER — DILTIAZEM HCL ER 240 MG PO CP24: 240.0000 mg | ORAL_CAPSULE | Freq: Every morning | ORAL | Status: DC

## 2015-09-23 NOTE — Telephone Encounter (Signed)
Marked as not taking. Should this be refilled? Please advise, thanks.

## 2015-12-21 ENCOUNTER — Ambulatory Visit (INDEPENDENT_AMBULATORY_CARE_PROVIDER_SITE_OTHER): Payer: Commercial Managed Care - HMO | Admitting: Internal Medicine

## 2015-12-21 ENCOUNTER — Encounter: Payer: Self-pay | Admitting: Internal Medicine

## 2015-12-21 VITALS — BP 158/90 | HR 65 | Temp 97.7°F | Resp 12 | Ht 64.0 in | Wt 168.0 lb

## 2015-12-21 DIAGNOSIS — M1990 Unspecified osteoarthritis, unspecified site: Secondary | ICD-10-CM

## 2015-12-21 MED ORDER — DILTIAZEM HCL ER 240 MG PO CP24: 240.0000 mg | ORAL_CAPSULE | Freq: Every morning | ORAL | Status: DC

## 2015-12-21 MED ORDER — DIGOXIN 250 MCG PO TABS: 250.0000 ug | ORAL_TABLET | Freq: Every morning | ORAL | Status: DC

## 2015-12-21 NOTE — Patient Instructions (Signed)
We would like you to go back to the orthopedic doctor to have them look at your shoulder. I think it would benefit from physical therapy to work on the muscle in the shoulder and neck to help get rid of your pain.   We are not checking labs today, come back in about 6 months for a physical.

## 2015-12-21 NOTE — Progress Notes (Signed)
Pre visit review using our clinic review tool, if applicable. No additional management support is needed unless otherwise documented below in the visit note. 

## 2015-12-24 NOTE — Progress Notes (Signed)
   Subjective:    Patient ID: Samantha Stevenson, female    DOB: 09-29-1951, 65 y.o.   MRN: [id number]  HPI The patient is a 65 YO female coming in for follow up of her shoulder pain. This is still going on for getting slightly better. She is still using the gel at times and the tramadol rarely. Uses tylenol when the pain is mild. She is trying to exercise some with the shoulder to work on the motion and it is moving better.  Review of Systems  Constitutional: Negative for fever, activity change, appetite change, fatigue and unexpected weight change.  HENT: Negative.   Eyes: Negative.   Respiratory: Negative for cough, chest tightness, shortness of breath and wheezing.   Cardiovascular: Negative for chest pain, palpitations and leg swelling.  Gastrointestinal: Negative for nausea, abdominal pain, diarrhea, constipation and abdominal distention.  Musculoskeletal: Positive for myalgias and arthralgias. Negative for back pain and gait problem.  Skin: Negative.   Neurological: Negative.   Psychiatric/Behavioral: Negative.       Objective:   Physical Exam  Constitutional: She is oriented to person, place, and time. She appears well-developed and well-nourished.  HENT:  Head: Normocephalic and atraumatic.  Eyes: EOM are normal.  Neck: Normal range of motion.  Cardiovascular: Normal rate and regular rhythm.   No murmur heard. Pulmonary/Chest: Effort normal and breath sounds normal. No respiratory distress. She has no wheezes. She has no rales.  Abdominal: Soft. Bowel sounds are normal. She exhibits no distension. There is no tenderness. There is no rebound.  Musculoskeletal: She exhibits no edema.  Neurological: She is alert and oriented to person, place, and time.  Skin: Skin is warm and dry.  Psychiatric: She has a normal mood and affect.   Filed Vitals:   12/21/15 0930  BP: 158/90  Pulse: 65  Temp: 97.7 F (36.5 C)  TempSrc: Oral  Resp: 12  Height: 5\' 4"  (1.626 m)  Weight: 168  lb (76.204 kg)  SpO2: 96%      Assessment & Plan:

## 2015-12-24 NOTE — Assessment & Plan Note (Signed)
She will continue to use tramadol, voltaren gel, and tylenol as needed for the pain. Went over stretching exercises for the shoulder to help increase motion and reduce pain.

## 2016-06-15 LAB — HM MAMMOGRAPHY: HM Mammogram: NORMAL (ref 0–4)

## 2016-06-19 ENCOUNTER — Encounter: Payer: Self-pay | Admitting: Internal Medicine

## 2016-06-19 ENCOUNTER — Ambulatory Visit (INDEPENDENT_AMBULATORY_CARE_PROVIDER_SITE_OTHER): Payer: Commercial Managed Care - HMO | Admitting: Internal Medicine

## 2016-06-19 ENCOUNTER — Other Ambulatory Visit (INDEPENDENT_AMBULATORY_CARE_PROVIDER_SITE_OTHER): Payer: Commercial Managed Care - HMO

## 2016-06-19 VITALS — BP 120/80 | HR 66 | Wt 168.0 lb

## 2016-06-19 DIAGNOSIS — R0600 Dyspnea, unspecified: Secondary | ICD-10-CM

## 2016-06-19 DIAGNOSIS — R7301 Impaired fasting glucose: Secondary | ICD-10-CM

## 2016-06-19 DIAGNOSIS — E785 Hyperlipidemia, unspecified: Secondary | ICD-10-CM

## 2016-06-19 DIAGNOSIS — Z23 Encounter for immunization: Secondary | ICD-10-CM | POA: Diagnosis not present

## 2016-06-19 DIAGNOSIS — I480 Paroxysmal atrial fibrillation: Secondary | ICD-10-CM | POA: Diagnosis not present

## 2016-06-19 DIAGNOSIS — R0609 Other forms of dyspnea: Secondary | ICD-10-CM | POA: Insufficient documentation

## 2016-06-19 LAB — COMPREHENSIVE METABOLIC PANEL
ALT: 15 U/L (ref 0–35)
AST: 16 U/L (ref 0–37)
Albumin: 3.7 g/dL (ref 3.5–5.2)
Alkaline Phosphatase: 88 U/L (ref 39–117)
BUN: 12 mg/dL (ref 6–23)
CO2: 31 mEq/L (ref 19–32)
Calcium: 8.6 mg/dL (ref 8.4–10.5)
Chloride: 104 mEq/L (ref 96–112)
Creatinine, Ser: 0.71 mg/dL (ref 0.40–1.20)
GFR: 88.23 mL/min (ref >60.00)
Glucose, Bld: 98 mg/dL (ref 70–99)
Potassium: 4.3 mEq/L (ref 3.5–5.1)
Sodium: 139 mEq/L (ref 135–145)
Total Bilirubin: 0.4 mg/dL (ref 0.2–1.2)
Total Protein: 7.2 g/dL (ref 6.0–8.3)

## 2016-06-19 LAB — LIPID PANEL
Cholesterol: 162 mg/dL (ref 0–200)
HDL: 40.5 mg/dL (ref >39.00)
LDL Cholesterol: 99 mg/dL (ref 0–99)
NonHDL: 121.96
Total CHOL/HDL Ratio: 4
Triglycerides: 114 mg/dL (ref 0.0–149.0)
VLDL: 22.8 mg/dL (ref 0.0–40.0)

## 2016-06-19 LAB — HEMOGLOBIN A1C: Hgb A1c MFr Bld: 6.2 % (ref 4.6–6.5)

## 2016-06-19 LAB — BRAIN NATRIURETIC PEPTIDE: Pro B Natriuretic peptide (BNP): 16 pg/mL (ref 0.0–100.0)

## 2016-06-19 NOTE — Progress Notes (Signed)
   Subjective:    Patient ID: Samantha Stevenson, female    DOB: Jan 06, 1951, 65 y.o.   MRN: [id number]  HPI  The patient is a 65 YO female coming in for follow up of her leg swelling. She has always had some for the last several months but it is getting worse in the last several weeks. 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 She denies long travel or surgery. No recent inactivity or leg swelling (just in the ankles). She does also need to see a cardiologist as I had asked her to return and she did not.   Review of Systems  Constitutional: Negative for activity change, appetite change, fatigue, fever and unexpected weight change.  HENT: Negative.   Eyes: Negative.   Respiratory: Positive for shortness of breath. Negative for cough, chest tightness and wheezing.   Cardiovascular: Positive for leg swelling. Negative for chest pain and palpitations.  Gastrointestinal: Negative for abdominal distention, abdominal pain, constipation, diarrhea and nausea.  Musculoskeletal: Positive for arthralgias and myalgias. Negative for back pain and gait problem.      Objective:   Physical Exam  Constitutional: She is oriented to person, place, and time. She appears well-developed and well-nourished.  HENT:  Head: Normocephalic and atraumatic.  Eyes: EOM are normal.  Neck: Normal range of motion.  Cardiovascular: Normal rate and regular rhythm.   No murmur heard. Pulmonary/Chest: Effort normal and breath sounds normal. No respiratory distress. She has no wheezes. She has no rales.  Abdominal: Soft. She exhibits no distension. There is no tenderness. There is no rebound.  Musculoskeletal: She exhibits edema.  1-2+ edema in the ankles bilaterally, no calf swelling or tenderness.   Neurological: She is alert and oriented to person, place, and time.  Skin: Skin is warm and dry.   Vitals:   06/19/16 1114  BP: 120/80  Pulse: 66  SpO2: 96%    Weight: 168 lb (76.2 kg)      Assessment & Plan:  Flu shot given at visit.

## 2016-06-19 NOTE — Assessment & Plan Note (Signed)
Refer to cardiology today since she has not returned. She is taking diltiazem and digoxin and not in A fib on exam today. Takes aspirin daily.

## 2016-06-19 NOTE — Assessment & Plan Note (Signed)
Checking BNP (although negative last year) and d-dimer due to history of PE. Checking CMP as well.

## 2016-06-19 NOTE — Progress Notes (Signed)
Pre visit review using our clinic review tool, if applicable. No additional management support is needed unless otherwise documented below in the visit note. 

## 2016-06-19 NOTE — Patient Instructions (Signed)
We are checking the blood work today.   We will get you in with the cardiologist.

## 2016-06-19 NOTE — Assessment & Plan Note (Signed)
Changed to crestor and needs repeat lipid panel with adjustment as needed.

## 2016-06-20 LAB — D-DIMER, QUANTITATIVE (NOT AT ARMC): D-Dimer, Quant: 0.42 mcg/mL FEU (ref <0.50)

## 2016-06-22 ENCOUNTER — Ambulatory Visit: Payer: Commercial Managed Care - HMO | Admitting: Internal Medicine

## 2016-06-30 ENCOUNTER — Encounter: Payer: Self-pay | Admitting: Internal Medicine

## 2016-07-05 ENCOUNTER — Encounter: Payer: Self-pay | Admitting: Cardiology

## 2016-07-07 ENCOUNTER — Other Ambulatory Visit: Payer: Self-pay | Admitting: Internal Medicine

## 2016-07-07 ENCOUNTER — Ambulatory Visit (INDEPENDENT_AMBULATORY_CARE_PROVIDER_SITE_OTHER): Payer: Commercial Managed Care - HMO | Admitting: Cardiology

## 2016-07-07 ENCOUNTER — Encounter: Payer: Self-pay | Admitting: Cardiology

## 2016-07-07 VITALS — BP 116/72 | HR 59 | Ht 64.0 in | Wt 167.0 lb

## 2016-07-07 DIAGNOSIS — I480 Paroxysmal atrial fibrillation: Secondary | ICD-10-CM | POA: Diagnosis not present

## 2016-07-07 DIAGNOSIS — R0602 Shortness of breath: Secondary | ICD-10-CM | POA: Diagnosis not present

## 2016-07-07 NOTE — Progress Notes (Signed)
Electrophysiology Office Note   Date:  07/07/2016   ID:  Samantha Stevenson, DOB December 27, 1950, MRN [id number]  PCP:  Samantha Koch, MD  Primary Electrophysiologist:  Samantha Boniface Meredith Leeds, MD    Chief Complaint  Patient presents with  . New Patient (Initial Visit)    PAF  . Chest Pain  . Shortness of Breath     History of Present Illness: Samantha Stevenson is a 65 y.o. female who presents today for electrophysiology evaluation.   Presenting to clinic for workup of atrial fibrillation. She says that she gets occasional palpitations, but it is fairly well controlled on her diltiazem and digoxin. She also gets shortness of breath at times. She says that she can walk on flat ground without problems and climb a flight of stairs without problems. She does sleep with her head propped up and at times wakes up in the middle the night feeling short of breath. She most commonly notices her shortness of breath when she is talking to people. She says that she gets short of breath to the point where she can't hold a conversation. This happened a few times a month.   Today, she denies symptoms of palpitations, chest pain, shortness of breath, orthopnea, PND, lower extremity edema, claudication, dizziness, presyncope, syncope, bleeding, or neurologic sequela. The patient is tolerating medications without difficulties and is otherwise without complaint today.    Past Medical History:  Diagnosis Date  . Anemia   . Arthritis   . Atrial fibrillation ([place])   . Degenerative arthritis of hip    s/p L THR 12/2012  . Dyslipidemia   . GERD (gastroesophageal reflux disease)   . Hyperlipidemia   . Hypertension   . Hypertension   . Migraines   . Myocardial infarction ([place]) 2000   due to atrial fib  . Pulmonary emboli ([place]) 12/2012 dx   post op (L THR), anticoag x 30mo   Past Surgical History:  Procedure Laterality Date  . Breast Ultrasound Left 04/16/13   Done @ breast center Impression: no  malignancy appearance noted on the screen study is consistent with a summation shadow  . PARTIAL HYSTERECTOMY    . TONSILLECTOMY    . TOTAL HIP ARTHROPLASTY Left 01/03/2013   Procedure: TOTAL HIP ARTHROPLASTY ANTERIOR APPROACH;  Surgeon: Mcarthur Rossetti, MD;  Location: [facility];  Service: Orthopedics;  Laterality: Left;  Left Total Hip Arthroplasty, Anterior Approach  . VAGINA RECONSTRUCTION SURGERY     vagina had closed up when 64 years old     Current Outpatient Prescriptions  Medication Sig Dispense Refill  . acetaminophen (TYLENOL) 500 MG tablet Take 500 mg by mouth as needed.    Marland Kitchen aspirin 325 MG tablet Take 325 mg by mouth daily.    . digoxin (DIGITEK) 0.25 MG tablet Take 1 tablet (250 mcg total) by mouth every morning. 90 tablet 3  . diltiazem (DILT-XR) 240 MG 24 hr capsule Take 1 capsule (240 mg total) by mouth every morning. 90 capsule 3  . ferrous sulfate 325 (65 FE) MG tablet Take 325 mg by mouth daily with breakfast.    . furosemide (LASIX) 20 MG tablet Take 1 tablet (20 mg total) by mouth daily. 30 tablet 11  . Multiple Vitamin (MULTIVITAMIN) tablet Take 1 tablet by mouth daily.    . rosuvastatin (CRESTOR) 20 MG tablet Take 1 tablet (20 mg total) by mouth daily. 90 tablet 3  . traMADol (ULTRAM) 50 MG tablet Take 1-2 tablets (50-100 mg total) by  mouth every 12 (twelve) hours as needed for moderate pain or severe pain. 60 tablet 3   No current facility-administered medications for this visit.     Allergies:   Latex; Penicillins; Asa [aspirin]; Celebrex [celecoxib]; and Nsaids   Social History:  The patient  reports that she has never smoked. She has never used smokeless tobacco. She reports that she does not drink alcohol or use drugs.   Family History:  The patient's family history includes Alcohol abuse in her other; Breast cancer in her other; Diabetes in her mother, other, and sister; Heart disease in her father and other; Hyperlipidemia in her other; Hypertension in  her other.    ROS:  Please see the history of present illness.   Otherwise, review of systems is positive for chest pain, leg swelling, leg pain, palpitations, cough, DOE, snoring, constipation, nausea, vomiting, back pain, muscle pain, joint swelling, headaches, rash, dizziness, easy brusiing.   All other systems are reviewed and negative.    PHYSICAL EXAM: VS:  BP 116/72   Pulse (!) 59   Ht 5\' 4"  (1.626 m)   Wt 167 lb (75.8 kg)   BMI 28.67 kg/m  , BMI Body mass index is 28.67 kg/m. GEN: Well nourished, well developed, in no acute distress  HEENT: normal  Neck: no JVD, carotid bruits, or masses Cardiac: RRR; no murmurs, rubs, or gallops,no edema  Respiratory:  clear to auscultation bilaterally, normal work of breathing GI: soft, nontender, nondistended, + BS MS: no deformity or atrophy  Skin: warm and dry Neuro:  Strength and sensation are intact Psych: euthymic mood, full affect  EKG:  EKG is ordered today. Personal review of the ekg ordered shows sinus rhythm, rate 59, LAFB  Recent Labs: 06/19/2016: ALT 15; BUN 12; Creatinine, Ser 0.71; Potassium 4.3; Pro B Natriuretic peptide (BNP) 16.0; Sodium 139    Lipid Panel     Component Value Date/Time   CHOL 162 06/19/2016 1151   TRIG 114.0 06/19/2016 1151   HDL 40.50 06/19/2016 1151   CHOLHDL 4 06/19/2016 1151   VLDL 22.8 06/19/2016 1151   LDLCALC 99 06/19/2016 1151     Wt Readings from Last 3 Encounters:  07/07/16 167 lb (75.8 kg)  06/19/16 168 lb (76.2 kg)  12/21/15 168 lb (76.2 kg)      Other studies Reviewed: Additional studies/ records that were reviewed today include: pcp notes   ASSESSMENT AND PLAN:  1.  Paroxysmal atrial fibrillation: CHADS2VASc of 2. Today she is in normal rhythm without any issues. She is short of breath, and it is unclear the cause of her shortness of breath. We'll therefore order a transthoracic echocardiogram to see if she has any cardiac causes. This Colbert Curenton also help further evaluate  her atrial fibrillation. Unfortunately, we do not have any records of her being in atrial fibrillation. We Justiss Gerbino try and obtain those records. She would benefit from anticoagulation in the future, if she does have atrial fibrillation, and we Trini Soldo potentially start that based on the records.  2.  Hypertension: well controlled today  3. Hyperlipidemia: on Crestor         Current medicines are reviewed at length with the patient today.   The patient does not have concerns regarding her medicines.  The following changes were made today:  none  Labs/ tests ordered today include:  No orders of the defined types were placed in this encounter.    Disposition:   FU with Destyn Parfitt 3 months  Signed,  Endrit Gittins Meredith Leeds, MD  07/07/2016 11:22 AM     [facility] [address] [place] [address] [phone] (office) [phone] (fax)

## 2016-07-07 NOTE — Patient Instructions (Signed)
Medication Instructions:  Your physician recommends that you continue on your current medications as directed. Please refer to the Current Medication list given to you today.  Labwork: None ordered  Testing/Procedures: Your physician has requested that you have an echocardiogram. Echocardiography is a painless test that uses sound waves to create images of your heart. It provides your doctor with information about the size and shape of your heart and how well your heart's chambers and valves are working. This procedure takes approximately one hour. There are no restrictions for this procedure.  Follow-Up: Your physician recommends that you schedule a follow-up appointment in: 3 months with Dr. Curt Bears.  If you need a refill on your cardiac medications before your next appointment, please call your pharmacy.  Thank you for choosing [facility]!!   Trinidad Curet, RN [phone]

## 2016-07-19 ENCOUNTER — Ambulatory Visit (HOSPITAL_COMMUNITY): Payer: Commercial Managed Care - HMO | Attending: Cardiovascular Disease

## 2016-07-19 ENCOUNTER — Other Ambulatory Visit: Payer: Self-pay

## 2016-07-19 DIAGNOSIS — I480 Paroxysmal atrial fibrillation: Secondary | ICD-10-CM

## 2016-07-19 DIAGNOSIS — I10 Essential (primary) hypertension: Secondary | ICD-10-CM | POA: Insufficient documentation

## 2016-07-19 DIAGNOSIS — R0600 Dyspnea, unspecified: Secondary | ICD-10-CM | POA: Diagnosis present

## 2016-07-19 DIAGNOSIS — E785 Hyperlipidemia, unspecified: Secondary | ICD-10-CM | POA: Diagnosis not present

## 2016-07-19 DIAGNOSIS — I252 Old myocardial infarction: Secondary | ICD-10-CM | POA: Insufficient documentation

## 2016-07-19 DIAGNOSIS — I313 Pericardial effusion (noninflammatory): Secondary | ICD-10-CM | POA: Insufficient documentation

## 2016-07-19 DIAGNOSIS — R0602 Shortness of breath: Secondary | ICD-10-CM

## 2016-07-26 ENCOUNTER — Ambulatory Visit (INDEPENDENT_AMBULATORY_CARE_PROVIDER_SITE_OTHER): Payer: Commercial Managed Care - HMO | Admitting: Orthopaedic Surgery

## 2016-07-26 DIAGNOSIS — M539 Dorsopathy, unspecified: Secondary | ICD-10-CM | POA: Diagnosis not present

## 2016-08-08 ENCOUNTER — Other Ambulatory Visit (INDEPENDENT_AMBULATORY_CARE_PROVIDER_SITE_OTHER): Payer: Self-pay | Admitting: Orthopaedic Surgery

## 2016-08-08 DIAGNOSIS — M545 Low back pain, unspecified: Secondary | ICD-10-CM

## 2016-08-15 ENCOUNTER — Ambulatory Visit (INDEPENDENT_AMBULATORY_CARE_PROVIDER_SITE_OTHER): Payer: Commercial Managed Care - HMO | Admitting: Orthopaedic Surgery

## 2016-08-17 NOTE — Progress Notes (Addendum)
07/07/16 Received 2 OV notes from Dr. Rolly Salter, dated 10/2012 & 04/2013, with an EKG.  Nothing showing/discussing AFib.  Will contact patient to discuss anticoagulation.  Spoke with pt week of Halloween.  She does not wish to start anticoagulation at this time.

## 2016-08-19 ENCOUNTER — Inpatient Hospital Stay: Admission: RE | Admit: 2016-08-19 | Payer: Commercial Managed Care - HMO | Source: Ambulatory Visit

## 2016-08-30 ENCOUNTER — Encounter (INDEPENDENT_AMBULATORY_CARE_PROVIDER_SITE_OTHER): Payer: Self-pay

## 2016-08-30 ENCOUNTER — Ambulatory Visit (INDEPENDENT_AMBULATORY_CARE_PROVIDER_SITE_OTHER): Payer: Commercial Managed Care - HMO | Admitting: Orthopaedic Surgery

## 2016-09-23 ENCOUNTER — Ambulatory Visit
Admission: RE | Admit: 2016-09-23 | Discharge: 2016-09-23 | Disposition: A | Discharge status: Home / Self Care | Payer: Commercial Managed Care - HMO | Source: Ambulatory Visit | Attending: Orthopaedic Surgery | Admitting: Orthopaedic Surgery

## 2016-09-23 DIAGNOSIS — M545 Low back pain, unspecified: Secondary | ICD-10-CM

## 2016-09-26 ENCOUNTER — Other Ambulatory Visit (INDEPENDENT_AMBULATORY_CARE_PROVIDER_SITE_OTHER): Payer: Self-pay

## 2016-09-26 ENCOUNTER — Ambulatory Visit (INDEPENDENT_AMBULATORY_CARE_PROVIDER_SITE_OTHER): Payer: Commercial Managed Care - HMO | Admitting: Orthopaedic Surgery

## 2016-09-26 DIAGNOSIS — M545 Low back pain: Secondary | ICD-10-CM

## 2016-09-26 DIAGNOSIS — M5441 Lumbago with sciatica, right side: Secondary | ICD-10-CM

## 2016-09-26 DIAGNOSIS — G8929 Other chronic pain: Secondary | ICD-10-CM | POA: Diagnosis not present

## 2016-09-26 NOTE — Progress Notes (Signed)
She is return for follow-up after MRI of her lumbar spine due to low back pain right-sided pelvic pain posteriorly and sciatic pain again going down her right side. We sent her for an MRI after very conservative treatment. She has not had any formal physical therapy.  On examination her pain seems to be in the facet joints the right side about mid lumbar spondylosis lumbar spine as well as the pelvis to does seem to have a positive straight leg raise on the right side but no overt weakness or significant sensory changes on her bilateral lower extremities.  MRI is reviewed and is on the cone system. She has multiple levels of facet disease and some disc bulging but no significant nerve compression that I can see.  At this point I would like to send her to [facility] physical therapy to work on her back and even potentially in her neck and right shoulder. She would like a consultation with Dr. Ernestina Patches to determine whether or not she would benefit from injections in her right side. I'm not sure which level to consider injection the most relief even something around the SI joint or lower aspects of the lumbar spine would be helpful. I'll see her back myself 6 weeks.

## 2016-10-06 ENCOUNTER — Ambulatory Visit: Payer: Commercial Managed Care - HMO | Admitting: Cardiology

## 2016-10-12 ENCOUNTER — Encounter (INDEPENDENT_AMBULATORY_CARE_PROVIDER_SITE_OTHER): Payer: Self-pay | Admitting: Physical Medicine and Rehabilitation

## 2016-10-12 ENCOUNTER — Telehealth (INDEPENDENT_AMBULATORY_CARE_PROVIDER_SITE_OTHER): Payer: Self-pay | Admitting: Physical Medicine and Rehabilitation

## 2016-10-12 ENCOUNTER — Ambulatory Visit (INDEPENDENT_AMBULATORY_CARE_PROVIDER_SITE_OTHER): Payer: Commercial Managed Care - HMO | Admitting: Physical Medicine and Rehabilitation

## 2016-10-12 VITALS — BP 129/74 | HR 61

## 2016-10-12 DIAGNOSIS — M47816 Spondylosis without myelopathy or radiculopathy, lumbar region: Secondary | ICD-10-CM

## 2016-10-12 NOTE — Telephone Encounter (Signed)
Faxed auth form with Dr. Kennon Portela note from today, Dr. Adela Ports last office note, and MRI report to [phone]

## 2016-10-12 NOTE — Progress Notes (Signed)
Samantha Stevenson - 65 y.o. female MRN [id number]  Date of birth: [birth year]  Office Visit Note: Visit Date: 10/12/2016 PCP: Samantha Koch, MD Referred by: Samantha Stevenson, *  Subjective: Chief Complaint  Patient presents with  . Lower Back - Pain   HPI: Mrs. Samantha Stevenson is a very 65 year old female who is active and enjoys working out and in the past has done some weight lifting and aerobic activity but lately is having a hard time because of pain issues. She's been followed by Dr. Ninfa Stevenson who did get an MRI of her lumbar spine after she failed conservative care. This MRI is reviewed below and reviewed with the patient today. She describes right sided pain- neck, arm, lower back radiating down right leg. Most of her pain is low back and right buttock area. She gets a lot of pain with standing and then some with walking. She is better at rest but she does have problems getting comfortable.   Also has left groin pain. Sometimes gets numbness and tingling in right leg. Cramping in right leg when standing. When the pain starts she says it is about a 10. Difficulty getting comfortable in bed/ sleeping. Some days worse than other. She did have a prior left total hip replacement. She does still have some pain in that area. She has a prescription for physical therapy but has not started this yet. She has not noted any focal weakness or foot drop. She's had no bowel or bladder complaints. No specific trauma. No history of lumbar surgery.     Review of Systems  Constitutional: Negative for chills, fever, malaise/fatigue and weight loss.  HENT: Negative for hearing loss and sinus pain.   Eyes: Negative for blurred vision, double vision and photophobia.  Respiratory: Negative for cough and shortness of breath.   Cardiovascular: Negative for chest pain, palpitations and leg swelling.  Gastrointestinal: Negative for abdominal pain, nausea and vomiting.  Genitourinary: Negative for flank pain.    Musculoskeletal: Positive for back pain and joint pain. Negative for myalgias.  Skin: Negative for itching and rash.  Neurological: Positive for tingling. Negative for tremors, focal weakness and weakness.  Endo/Heme/Allergies: Negative.   Psychiatric/Behavioral: Negative for depression.  All other systems reviewed and are negative.  Otherwise per HPI.  Assessment & Plan: Visit Diagnoses:  1. Spondylosis without myelopathy or radiculopathy, lumbar region     Plan: Findings:  Chronic worsening low back pain worse on the right posterior buttock and hip some referral in the leg with prolonged standing. No specific pattern to the foot although she does get some cramping in the ankle. This would be more of an L5 distribution and is nerve root related. She continues of left hip and groin pain at times. She's had a prior hip replacement. Lumbar spine MRI shows multilevel facet arthropathy particularly at L3-4 and L4-5 with a small bit of listhesis of L3 on L4. There is disc bulging but no real focal herniation no focal stenosis. No nerve compression. I think a lot of her pain complaints on the right is facet mediated pain. The L4-5 facet joint on the right on the MRI does have a fluid signal. I think the best approach is a diagnostic L4-5 facet joint block. I also want her to start the physical therapy and start working with that in getting back into some exercise. I have also recommended that she start taking Tylenol 3 times per day schedule was set up as needed and see if that  works better. She can take the tramadol again as needed she does tolerate fairly well. I think weight lifting for her would be fine it's nonimpact as long as she is not going to out of the ordinary with weight amounts. She can ask Dr. Ninfa Stevenson concerning the exercises with her hip. She did also review this with physical therapy. If she doesn't get much relief with the facet joint block at least one time I would try an L5  transforaminal epidural injection. Again this would be diagnostic and hopefully therapeutic. For her neck and arm pain she should review that with Dr. Ninfa Stevenson when she follows up. Happy to look at that with them as well. I spent more than 25 minutes speaking face-to-face with the patient with 50% of the time in counseling.    Meds & Orders: No orders of the defined types were placed in this encounter.  No orders of the defined types were placed in this encounter.   Follow-up: Return for Scheduled L4-5 facet joint injection on the right..   Procedures: No procedures performed  No notes on file   Clinical History: Lumbar spine MRI dated 09/23/2016   L3-4: There is uncovering of a broad-based disc protrusion. Moderate facet hypertrophy has progressed. Mild foraminal narrowing is worse, left greater than right.  L4-5: A broad-based disc protrusion is asymmetric to the left. Progressive moderate facet hypertrophy is asymmetric to the right. Mild left subarticular narrowing is stable. Mild foraminal narrowing is stable, left greater than right.  L5-S1: A mild broad-based disc protrusion is present. Progressive moderate facet hypertrophy is noted. The central canal is patent. Mild foraminal narrowing has progressed, left greater than right.  She reports that she has never smoked. She has never used smokeless tobacco.   Recent Labs  06/19/16 1151  HGBA1C 6.2    Objective:  VS:  HT:    WT:   BMI:     BP:129/74  HR:61bpm  TEMP: ( )  RESP:  Physical Exam  Constitutional: She is oriented to person, place, and time. She appears well-developed and well-nourished. No distress.  HENT:  Head: Normocephalic and atraumatic.  Nose: Nose normal.  Mouth/Throat: Oropharynx is clear and moist.  Eyes: Conjunctivae are normal. Pupils are equal, round, and reactive to light.  Neck: Normal range of motion. Neck supple.  Cardiovascular: Regular rhythm and intact distal pulses.     Pulmonary/Chest: Effort normal and breath sounds normal.  Abdominal: She exhibits no distension.  Musculoskeletal:  Patient ambulates without aid. She does have concordant pain with extension rotation to the right. She has mild pain over the right greater trochanter. She has no pain with hip rotation and good distal strength without any deficits. No clonus bilaterally.  Neurological: She is alert and oriented to person, place, and time. She displays normal reflexes. No cranial nerve deficit or sensory deficit. She exhibits normal muscle tone. Coordination normal.  Skin: Skin is warm. No rash noted. No erythema.  Psychiatric: She has a normal mood and affect. Her behavior is normal.  Nursing note and vitals reviewed.   Ortho Exam Imaging: No results found.  Past Medical/Family/Surgical/Social History: Medications & Allergies reviewed per EMR Patient Active Problem List   Diagnosis Date Noted  . Dyspnea 06/19/2016  . Routine general medical examination at a health care facility 06/24/2015  . Constipation 03/17/2015  . Myalgia and myositis 03/17/2015  . Dyslipidemia   . Atrial fibrillation ([place])   . Hypertension   . Pulmonary emboli ([place]) 01/10/2013  .  EKG, abnormal 01/10/2013  . Anemia 01/10/2013  . Arthritis 01/03/2013   Past Medical History:  Diagnosis Date  . Anemia   . Arthritis   . Atrial fibrillation ([place])   . Degenerative arthritis of hip    s/p L THR 12/2012  . Dyslipidemia   . GERD (gastroesophageal reflux disease)   . Hyperlipidemia   . Hypertension   . Hypertension   . Migraines   . Myocardial infarction 2000   due to atrial fib  . Pulmonary emboli ([place]) 12/2012 dx   post op (L THR), anticoag x 22mo   Family History  Problem Relation Age of Onset  . Heart disease Father   . Diabetes Mother   . Diabetes Sister   . Alcohol abuse Other   . Heart disease Other   . Hyperlipidemia Other   . Hypertension Other   . Diabetes Other   . Breast cancer Other     Past Surgical History:  Procedure Laterality Date  . Breast Ultrasound Left 04/16/13   Done @ breast center Impression: no malignancy appearance noted on the screen study is consistent with a summation shadow  . PARTIAL HYSTERECTOMY    . TONSILLECTOMY    . TOTAL HIP ARTHROPLASTY Left 01/03/2013   Procedure: TOTAL HIP ARTHROPLASTY ANTERIOR APPROACH;  Surgeon: Mcarthur Rossetti, MD;  Location: [facility];  Service: Orthopedics;  Laterality: Left;  Left Total Hip Arthroplasty, Anterior Approach  . VAGINA RECONSTRUCTION SURGERY     vagina had closed up when 65 years old   Social History   Occupational History  . Not on file.   Social History Main Topics  . Smoking status: Never Smoker  . Smokeless tobacco: Never Used  . Alcohol use No  . Drug use: No  . Sexual activity: Not on file

## 2016-10-12 NOTE — Patient Instructions (Signed)
Start taking Tylenol arthritis or extra strength 1 capsule 3 times per day. Continue tramadol as needed per Dr. Trevor Mace instructions. Please start physical therapy.

## 2016-10-13 NOTE — Telephone Encounter (Signed)
Received auth. Josem Kaufmann [id number] for [phone]. Eff 10/19/16-04/16/17. Called pt and she is scheduled for 10/27/15 @ 3:15 w/driver.

## 2016-10-26 ENCOUNTER — Encounter (INDEPENDENT_AMBULATORY_CARE_PROVIDER_SITE_OTHER): Payer: Self-pay | Admitting: Physical Medicine and Rehabilitation

## 2016-10-26 ENCOUNTER — Ambulatory Visit (INDEPENDENT_AMBULATORY_CARE_PROVIDER_SITE_OTHER): Payer: Medicare PPO | Admitting: Physical Medicine and Rehabilitation

## 2016-10-26 VITALS — BP 145/78 | HR 77 | Temp 98.0°F

## 2016-10-26 DIAGNOSIS — M47816 Spondylosis without myelopathy or radiculopathy, lumbar region: Secondary | ICD-10-CM

## 2016-10-26 MED ORDER — METHYLPREDNISOLONE ACETATE 80 MG/ML IJ SUSP
80.0000 mg | Freq: Once | INTRAMUSCULAR | Status: AC
  Administered 2016-10-26: 80 mg

## 2016-10-26 MED ORDER — LIDOCAINE HCL (PF) 1 % IJ SOLN: 0.3300 mL | Freq: Once | INTRAMUSCULAR | Status: DC

## 2016-10-26 NOTE — Patient Instructions (Signed)

## 2016-10-26 NOTE — Procedures (Signed)
Lumbar Facet Joint Intra-Articular Injection(s) with Fluoroscopic Guidance  Patient: Samantha Stevenson      Date of Birth: 1951/06/24 MRN: [id number] PCP: Hoyt Koch, MD      Visit Date: 10/26/2016   Universal Protocol:    Date/Time: 01/04/183:53 PM  Consent Given By: the patient  Position: PRONE   Additional Comments: Vital signs were monitored before and after the procedure. Patient was prepped and draped in the usual sterile fashion. The correct patient, procedure, and site was verified.   Injection Procedure Details:  Procedure Site One Meds Administered:  Meds ordered this encounter  Medications  . lidocaine (PF) (XYLOCAINE) 1 % injection 0.3 mL  . methylPREDNISolone acetate (DEPO-MEDROL) injection 80 mg     Laterality: Left  Location/Site:  L4-L5  Needle size: 22 guage  Needle type: Spinal  Needle Placement: Articular  Findings:  -Contrast Used: 1 mL iohexol 180 mg iodine/mL   -Comments: Excellent flow of contrast producing a partial arthrogram.  Procedure Details: The fluoroscope beam is vertically oriented in AP, and the inferior recess is visualized beneath the lower pole of the inferior apophyseal process, which represents the target point for needle insertion. When direct visualization is difficult the target point is located at the medial projection of the vertebral pedicle. The region overlying each aforementioned target is locally anesthetized with a 1 to 2 ml. volume of 1% Lidocaine without Epinephrine.   The spinal needle was inserted into each of the above mentioned facet joints using biplanar fluoroscopic guidance. A 0.25 to 0.5 ml. volume of Isovue-250 was injected and a partial facet joint arthrogram was obtained. A single spot film was obtained of the resulting arthrogram.    One to 1.25 ml of the steroid/anesthetic solution was then injected into each of the facet joints noted above.   Additional Comments:  The patient tolerated the  procedure well Dressing: Band-Aid    Post-procedure details: Patient was observed during the procedure. Post-procedure instructions were reviewed.  Patient left the clinic in stable condition.

## 2016-10-26 NOTE — Progress Notes (Signed)
Samantha Stevenson - 66 y.o. female MRN [id number]  Date of birth: 1950-12-02  Office Visit Note: Visit Date: 10/26/2016 PCP: Hoyt Koch, MD Referred by: Hoyt Koch, *  Subjective: Chief Complaint  Patient presents with  . Lower Back - Pain   HPI: Samantha Stevenson is a 66 year old female here today for planned right L4-5 facet injection. No change in symptoms. She still having mostly left-sided axial low back pain she does get some referral of the hip. Please see our prior evaluation and management note for further details and justification.     ROS Otherwise per HPI.  Assessment & Plan: Visit Diagnoses:  1. Spondylosis without myelopathy or radiculopathy, lumbar region     Plan: Findings:  Left L4-5 facet joint injection diagnostically and hopefully therapeutically. Please see our prior evaluation and management note for further details and justification. It is not beneficial of look at L5 or L4 transforaminal epidural injection.    Meds & Orders:  Meds ordered this encounter  Medications  . lidocaine (PF) (XYLOCAINE) 1 % injection 0.3 mL  . methylPREDNISolone acetate (DEPO-MEDROL) injection 80 mg    Orders Placed This Encounter  Procedures  . Nerve Block    Follow-up: Return if symptoms worsen or fail to improve, 2 weeks.   Procedures: No procedures performed  Lumbar Facet Joint Intra-Articular Injection(s) with Fluoroscopic Guidance  Patient: Samantha Stevenson      Date of Birth: 06/07/1951 MRN: [id number] PCP: Hoyt Koch, MD      Visit Date: 10/26/2016   Universal Protocol:    Date/Time: 01/04/183:53 PM  Consent Given By: the patient  Position: PRONE   Additional Comments: Vital signs were monitored before and after the procedure. Patient was prepped and draped in the usual sterile fashion. The correct patient, procedure, and site was verified.   Injection Procedure Details:  Procedure Site One Meds Administered:  Meds  ordered this encounter  Medications  . lidocaine (PF) (XYLOCAINE) 1 % injection 0.3 mL  . methylPREDNISolone acetate (DEPO-MEDROL) injection 80 mg     Laterality: Left  Location/Site:  L4-L5  Needle size: 22 guage  Needle type: Spinal  Needle Placement: Articular  Findings:  -Contrast Used: 1 mL iohexol 180 mg iodine/mL   -Comments: Excellent flow of contrast producing a partial arthrogram.  Procedure Details: The fluoroscope beam is vertically oriented in AP, and the inferior recess is visualized beneath the lower pole of the inferior apophyseal process, which represents the target point for needle insertion. When direct visualization is difficult the target point is located at the medial projection of the vertebral pedicle. The region overlying each aforementioned target is locally anesthetized with a 1 to 2 ml. volume of 1% Lidocaine without Epinephrine.   The spinal needle was inserted into each of the above mentioned facet joints using biplanar fluoroscopic guidance. A 0.25 to 0.5 ml. volume of Isovue-250 was injected and a partial facet joint arthrogram was obtained. A single spot film was obtained of the resulting arthrogram.    One to 1.25 ml of the steroid/anesthetic solution was then injected into each of the facet joints noted above.   Additional Comments:  The patient tolerated the procedure well Dressing: Band-Aid    Post-procedure details: Patient was observed during the procedure. Post-procedure instructions were reviewed.  Patient left the clinic in stable condition.       Clinical History: Lumbar spine MRI dated 09/23/2016   L3-4: There is uncovering of a broad-based disc protrusion. Moderate facet hypertrophy  has progressed. Mild foraminal narrowing is worse, left greater than right.  L4-5: A broad-based disc protrusion is asymmetric to the left. Progressive moderate facet hypertrophy is asymmetric to the right. Mild left subarticular narrowing is  stable. Mild foraminal narrowing is stable, left greater than right.  L5-S1: A mild broad-based disc protrusion is present. Progressive moderate facet hypertrophy is noted. The central canal is patent. Mild foraminal narrowing has progressed, left greater than right.  She reports that she has never smoked. She has never used smokeless tobacco.   Recent Labs  06/19/16 1151  HGBA1C 6.2    Objective:  VS:  HT:    WT:   BMI:     BP:(!) 145/78  HR:77bpm  TEMP:98 F (36.7 C)(Oral)  RESP:95 % Physical Exam  Musculoskeletal:  Patient relates without aid with good distal strength    Ortho Exam Imaging: No results found.  Past Medical/Family/Surgical/Social History: Medications & Allergies reviewed per EMR Patient Active Problem List   Diagnosis Date Noted  . Dyspnea 06/19/2016  . Routine general medical examination at a health care facility 06/24/2015  . Constipation 03/17/2015  . Myalgia and myositis 03/17/2015  . Dyslipidemia   . Atrial fibrillation ([place])   . Hypertension   . Pulmonary emboli ([place]) 01/10/2013  . EKG, abnormal 01/10/2013  . Anemia 01/10/2013  . Arthritis 01/03/2013   Past Medical History:  Diagnosis Date  . Anemia   . Arthritis   . Atrial fibrillation ([place])   . Degenerative arthritis of hip    s/p L THR 12/2012  . Dyslipidemia   . GERD (gastroesophageal reflux disease)   . Hyperlipidemia   . Hypertension   . Hypertension   . Migraines   . Myocardial infarction 2000   due to atrial fib  . Pulmonary emboli ([place]) 12/2012 dx   post op (L THR), anticoag x 6mo   Family History  Problem Relation Age of Onset  . Heart disease Father   . Diabetes Mother   . Diabetes Sister   . Alcohol abuse Other   . Heart disease Other   . Hyperlipidemia Other   . Hypertension Other   . Diabetes Other   . Breast cancer Other    Past Surgical History:  Procedure Laterality Date  . Breast Ultrasound Left 04/16/13   Done @ breast center Impression: no  malignancy appearance noted on the screen study is consistent with a summation shadow  . PARTIAL HYSTERECTOMY    . TONSILLECTOMY    . TOTAL HIP ARTHROPLASTY Left 01/03/2013   Procedure: TOTAL HIP ARTHROPLASTY ANTERIOR APPROACH;  Surgeon: Mcarthur Rossetti, MD;  Location: [facility];  Service: Orthopedics;  Laterality: Left;  Left Total Hip Arthroplasty, Anterior Approach  . VAGINA RECONSTRUCTION SURGERY     vagina had closed up when 66 years old   Social History   Occupational History  . Not on file.   Social History Main Topics  . Smoking status: Never Smoker  . Smokeless tobacco: Never Used  . Alcohol use No  . Drug use: No  . Sexual activity: Not on file

## 2016-11-07 ENCOUNTER — Ambulatory Visit (INDEPENDENT_AMBULATORY_CARE_PROVIDER_SITE_OTHER): Payer: Commercial Managed Care - HMO | Admitting: Orthopaedic Surgery

## 2016-12-18 ENCOUNTER — Ambulatory Visit (INDEPENDENT_AMBULATORY_CARE_PROVIDER_SITE_OTHER): Payer: Medicare PPO | Admitting: Internal Medicine

## 2016-12-18 ENCOUNTER — Encounter: Payer: Self-pay | Admitting: Internal Medicine

## 2016-12-18 DIAGNOSIS — M1990 Unspecified osteoarthritis, unspecified site: Secondary | ICD-10-CM

## 2016-12-18 DIAGNOSIS — I10 Essential (primary) hypertension: Secondary | ICD-10-CM

## 2016-12-18 NOTE — Patient Instructions (Signed)
We do not need to make any changes or do labs today.

## 2016-12-18 NOTE — Progress Notes (Signed)
Pre visit review using our clinic review tool, if applicable. No additional management support is needed unless otherwise documented below in the visit note. 

## 2016-12-18 NOTE — Progress Notes (Signed)
   Subjective:    Patient ID: Samantha Stevenson, female    DOB: Apr 06, 1951, 66 y.o.   MRN: [id number]  HPI The patient is a 66 YO female coming in for her hypertension. She is still taking the digoxin and the diltiazem. She is not happy with her last cardiology visit. They did not believe that she had A fib although this was diagnosed with her previous PCP. She then was irritated that after the visit a nurse called her and told her she was going to be starting blood thinners and she felt that the doctor should have talked to her about why at the visit if they wanted her to start something new. She denies chest pains, SOB, swelling since last visit. No stroke symptoms or hx stroke.  Review of Systems  Constitutional: Negative.   Respiratory: Negative.   Cardiovascular: Negative.   Gastrointestinal: Negative.   Musculoskeletal: Negative.   Skin: Negative.   Neurological: Negative.       Objective:   Physical Exam  Constitutional: She is oriented to person, place, and time. She appears well-developed and well-nourished.  HENT:  Head: Normocephalic and atraumatic.  Eyes: EOM are normal.  Neck: Normal range of motion.  Cardiovascular: Normal rate and regular rhythm.   Pulmonary/Chest: Effort normal and breath sounds normal. No respiratory distress. She has no wheezes. She has no rales.  Abdominal: Soft. She exhibits no distension. There is no tenderness. There is no rebound.  Musculoskeletal: She exhibits no edema.  Neurological: She is alert and oriented to person, place, and time.  Skin: Skin is warm and dry.   Vitals:   12/18/16 1027  BP: 140/80  Pulse: 69  Temp: 98.2 F (36.8 C)  TempSrc: Oral  SpO2: 96%  Weight: 169 lb (76.7 kg)  Height: 5\' 4"  (1.626 m)      Assessment & Plan:

## 2016-12-19 NOTE — Assessment & Plan Note (Signed)
Taking diltiazem and lasix to help with BP and doing well. Last BMP reviewed with the patient and no changes needed. Will continue to monitor and adjust.

## 2016-12-19 NOTE — Assessment & Plan Note (Signed)
Using tylenol as needed and reminded her about exercise as a way to have less pain overall in her joints and she will get back to exercise.

## 2017-02-11 ENCOUNTER — Other Ambulatory Visit: Payer: Self-pay | Admitting: Internal Medicine

## 2017-02-21 ENCOUNTER — Other Ambulatory Visit: Payer: Self-pay | Admitting: Internal Medicine

## 2017-03-21 ENCOUNTER — Ambulatory Visit (INDEPENDENT_AMBULATORY_CARE_PROVIDER_SITE_OTHER): Payer: Medicare PPO | Admitting: *Deleted

## 2017-03-21 VITALS — BP 132/80 | HR 56 | Resp 20 | Ht 64.0 in | Wt 168.0 lb

## 2017-03-21 DIAGNOSIS — E2839 Other primary ovarian failure: Secondary | ICD-10-CM | POA: Diagnosis not present

## 2017-03-21 DIAGNOSIS — Z0000 Encounter for general adult medical examination without abnormal findings: Secondary | ICD-10-CM

## 2017-03-21 NOTE — Progress Notes (Signed)
Pre visit review using our clinic review tool, if applicable. No additional management support is needed unless otherwise documented below in the visit note. 

## 2017-03-21 NOTE — Patient Instructions (Signed)
Continue to eat heart healthy diet (full of fruits, vegetables, whole grains, lean protein, water--limit salt, fat, and sugar intake) and increase physical activity as tolerated.  Continue doing brain stimulating activities (puzzles, reading, adult coloring books, staying active) to keep memory sharp.    Samantha Stevenson , Thank you for taking time to come for your Medicare Wellness Visit. I appreciate your ongoing commitment to your health goals. Please review the following plan we discussed and let me know if I can assist you in the future.   These are the goals we discussed: Goals    . I would like to lose 15 pounds and firm up my body          I will continue to exercise and eat healthy       This is a list of the screening recommended for you and due dates:  Health Maintenance  Topic Date Due  .  Hepatitis C: One time screening is recommended by Center for Disease Control  (CDC) for  adults born from [birth year] through 1963.   02-Sep-1951  . HIV Screening  12/25/1967  . Flu Shot  05/23/2017  . Mammogram  06/15/2018  . Tetanus Vaccine  10/23/2021  . Colon Cancer Screening  09/10/2022    Neck Exercises Neck exercises can be important for many reasons:  They can help you to improve and maintain flexibility in your neck. This can be especially important as you age.  They can help to make your neck stronger. This can make movement easier.  They can reduce or prevent neck pain.  They may help your upper back. Ask your health care provider which neck exercises would be best for you. Exercises Neck Press  Repeat this exercise 10 times. Do it first thing in the morning and right before bed or as told by your health care provider. 1. Lie on your back on a firm bed or on the floor with a pillow under your head. 2. Use your neck muscles to push your head down on the pillow and straighten your spine. 3. Hold the position as well as you can. Keep your head facing up and your chin  tucked. 4. Slowly count to 5 while holding this position. 5. Relax for a few seconds. Then repeat. Isometric Strengthening  Do a full set of these exercises 2 times a day or as told by your health care provider. 1. Sit in a supportive chair and place your hand on your forehead. 2. Push forward with your head and neck while pushing back with your hand. Hold for 10 seconds. 3. Relax. Then repeat the exercise 3 times. 4. Next, do thesequence again, this time putting your hand against the back of your head. Use your head and neck to push backward against the hand pressure. 5. Finally, do the same exercise on either side of your head, pushing sideways against the pressure of your hand. Prone Head Lifts  Repeat this exercise 5 times. Do this 2 times a day or as told by your health care provider. 1. Lie face-down, resting on your elbows so that your chest and upper back are raised. 2. Start with your head facing downward, near your chest. Position your chin either on or near your chest. 3. Slowly lift your head upward. Lift until you are looking straight ahead. Then continue lifting your head as far back as you can stretch. 4. Hold your head up for 5 seconds. Then slowly lower it to your starting position. Supine  Head Lifts  Repeat this exercise 8-10 times. Do this 2 times a day or as told by your health care provider. 1. Lie on your back, bending your knees to point to the ceiling and keeping your feet flat on the floor. 2. Lift your head slowly off the floor, raising your chin toward your chest. 3. Hold for 5 seconds. 4. Relax and repeat. Scapular Retraction  Repeat this exercise 5 times. Do this 2 times a day or as told by your health care provider. 1. Stand with your arms at your sides. Look straight ahead. 2. Slowly pull both shoulders backward and downward until you feel a stretch between your shoulder blades in your upper back. 3. Hold for 10-30 seconds. 4. Relax and repeat. Contact a  health care provider if:  Your neck pain or discomfort gets much worse when you do an exercise.  Your neck pain or discomfort does not improve within 2 hours after you exercise. If you have any of these problems, stop exercising right away. Do not do the exercises again unless your health care provider says that you can. Get help right away if:  You develop sudden, severe neck pain. If this happens, stop exercising right away. Do not do the exercises again unless your health care provider says that you can. Exercises Neck Stretch  Repeat this exercise 3-5 times. 1. Do this exercise while standing or while sitting in a chair. 2. Place your feet flat on the floor, shoulder-width apart. 3. Slowly turn your head to the right. Turn it all the way to the right so you can look over your right shoulder. Do not tilt or tip your head. 4. Hold this position for 10-30 seconds. 5. Slowly turn your head to the left, to look over your left shoulder. 6. Hold this position for 10-30 seconds. Neck Retraction Repeat this exercise 8-10 times. Do this 3-4 times a day or as told by your health care provider. 1. Do this exercise while standing or while sitting in a sturdy chair. 2. Look straight ahead. Do not bend your neck. 3. Use your fingers to push your chin backward. Do not bend your neck for this movement. Continue to face straight ahead. If you are doing the exercise properly, you will feel a slight sensation in your throat and a stretch at the back of your neck. 4. Hold the stretch for 1-2 seconds. Relax and repeat. This information is not intended to replace advice given to you by your health care provider. Make sure you discuss any questions you have with your health care provider. Document Released: 09/20/2015 Document Revised: 03/16/2016 Document Reviewed: 04/19/2015 Elsevier Interactive Patient Education  2017 [employer]  Shoulder Range of Motion Exercises Shoulder range of motion (ROM)  exercises are designed to keep the shoulder moving freely. They are often recommended for people who have shoulder pain. Phase 1 exercises When you are able, do this exercise 5-6 days per week, or as told by your health care provider. Work toward doing 2 sets of 10 swings. Pendulum Exercise  How To Do This Exercise Lying Down 1. Lie face-down on a bed with your abdomen close to the side of the bed. 2. Let your arm hang over the side of the bed. 3. Relax your shoulder, arm, and hand. 4. Slowly and gently swing your arm forward and back. Do not use your neck muscles to swing your arm. They should be relaxed. If you are struggling to swing your arm, have someone  gently swing it for you. When you do this exercise for the first time, swing your arm at a 15 degree angle for 15 seconds, or swing your arm 10 times. As pain lessens over time, increase the angle of the swing to 30-45 degrees. 5. Repeat steps 1-4 with the other arm. How To Do This Exercise While Standing 6. Stand next to a sturdy chair or table and hold on to it with your hand. 1. Bend forward at the waist. 2. Bend your knees slightly. 3. Relax your other arm and let it hang limp. 4. Relax the shoulder blade of the arm that is hanging and let it drop. 5. While keeping your shoulder relaxed, use body motion to swing your arm in small circles. The first time you do this exercise, swing your arm for about 30 seconds or 10 times. When you do it next time, swing your arm for a little longer. 6. Stand up tall and relax. 7. Repeat steps 1-7, this time changing the direction of the circles. 7. Repeat steps 1-8 with the other arm. Phase 2 exercises Do these exercises 3-4 times per day on 5-6 days per week or as told by your health care provider. Work toward holding the stretch for 20 seconds. Stretching Exercise 1  6. Lift your arm straight out in front of you. 7. Bend your arm 90 degrees at the elbow (right angle) so your forearm goes across  your body and looks like the letter "L." 8. Use your other arm to gently pull the elbow forward and across your body. 9. Repeat steps 1-3 with the other arm. Stretching Exercise 2  You will need a towel or rope for this exercise. 5. Bend one arm behind your back with the palm facing outward. 6. Hold a towel with your other hand. 7. Reach the arm that holds the towel above your head, and bend that arm at the elbow. Your wrist should be behind your neck. 8. Use your free hand to grab the free end of the towel. 9. With the higher hand, gently pull the towel up behind you. 10. With the lower hand, pull the towel down behind you. 11. Repeat steps 1-6 with the other arm. Phase 3 exercises Do each of these exercises at four different times of day (sessions) every day or as told by your health care provider. To begin with, repeat each exercise 5 times (repetitions). Work toward doing 3 sets of 12 repetitions or as told by your health care provider. Strengthening Exercise 1  You will need a light weight for this activity. As you grow stronger, you may use a heavier weight. 5. Standing with a weight in your hand, lift your arm straight out to the side until it is at the same height as your shoulder. 6. Bend your arm at 90 degrees so that your fingers are pointing to the ceiling. 7. Slowly raise your hand until your arm is straight up in the air. 8. Repeat steps 1-3 with the other arm. Strengthening Exercise 2  You will need a light weight for this activity. As you grow stronger, you may use a heavier weight. 5. Standing with a weight in your hand, gradually move your straight arm in an arc, starting at your side, then out in front of you, then straight up over your head. 6. Gradually move your other arm in an arc, starting at your side, then out in front of you, then straight up over your head. 7. Repeat  steps 1-2 with the other arm. Strengthening Exercise 3  You will need an elastic band for this  activity. As you grow stronger, gradually increase the size of the bands or increase the number of bands that you use at one time. 1. While standing, hold an elastic band in one hand and raise that arm up in the air. 2. With your other hand, pull down the band until that hand is by your side. 3. Repeat steps 1-2 with the other arm. This information is not intended to replace advice given to you by your health care provider. Make sure you discuss any questions you have with your health care provider. Document Released: 07/08/2003 Document Revised: 06/04/2016 Document Reviewed: 10/05/2014 Elsevier Interactive Patient Education  2017 [employer]. Insomnia Insomnia is a sleep disorder that makes it difficult to fall asleep or to stay asleep. Insomnia can cause tiredness (fatigue), low energy, difficulty concentrating, mood swings, and poor performance at work or school. There are three different ways to classify insomnia:  Difficulty falling asleep.  Difficulty staying asleep.  Waking up too early in the morning. Any type of insomnia can be long-term (chronic) or short-term (acute). Both are common. Short-term insomnia usually lasts for three months or less. Chronic insomnia occurs at least three times a week for longer than three months. What are the causes? Insomnia may be caused by another condition, situation, or substance, such as:  Anxiety.  Certain medicines.  Gastroesophageal reflux disease (GERD) or other gastrointestinal conditions.  Asthma or other breathing conditions.  Restless legs syndrome, sleep apnea, or other sleep disorders.  Chronic pain.  Menopause. This may include hot flashes.  Stroke.  Abuse of alcohol, tobacco, or illegal drugs.  Depression.  Caffeine.  Neurological disorders, such as Alzheimer disease.  An overactive thyroid (hyperthyroidism). The cause of insomnia may not be known. What increases the risk? Risk factors for insomnia  include:  Gender. Women are more commonly affected than men.  Age. Insomnia is more common as you get older.  Stress. This may involve your professional or personal life.  Income. Insomnia is more common in people with lower income.  Lack of exercise.  Irregular work schedule or night shifts.  Traveling between different time zones. What are the signs or symptoms? If you have insomnia, trouble falling asleep or trouble staying asleep is the main symptom. This may lead to other symptoms, such as:  Feeling fatigued.  Feeling nervous about going to sleep.  Not feeling rested in the morning.  Having trouble concentrating.  Feeling irritable, anxious, or depressed. How is this treated? Treatment for insomnia depends on the cause. If your insomnia is caused by an underlying condition, treatment will focus on addressing the condition. Treatment may also include:  Medicines to help you sleep.  Counseling or therapy.  Lifestyle adjustments. Follow these instructions at home:  Take medicines only as directed by your health care provider.  Keep regular sleeping and waking hours. Avoid naps.  Keep a sleep diary to help you and your health care provider figure out what could be causing your insomnia. Include:  When you sleep.  When you wake up during the night.  How well you sleep.  How rested you feel the next day.  Any side effects of medicines you are taking.  What you eat and drink.  Make your bedroom a comfortable place where it is easy to fall asleep:  Put up shades or special blackout curtains to block light from outside.  Use  a white noise machine to block noise.  Keep the temperature cool.  Exercise regularly as directed by your health care provider. Avoid exercising right before bedtime.  Use relaxation techniques to manage stress. Ask your health care provider to suggest some techniques that may work well for you. These may include:  Breathing  exercises.  Routines to release muscle tension.  Visualizing peaceful scenes.  Cut back on alcohol, caffeinated beverages, and cigarettes, especially close to bedtime. These can disrupt your sleep.  Do not overeat or eat spicy foods right before bedtime. This can lead to digestive discomfort that can make it hard for you to sleep.  Limit screen use before bedtime. This includes:  Watching TV.  Using your smartphone, tablet, and computer.  Stick to a routine. This can help you fall asleep faster. Try to do a quiet activity, brush your teeth, and go to bed at the same time each night.  Get out of bed if you are still awake after 15 minutes of trying to sleep. Keep the lights down, but try reading or doing a quiet activity. When you feel sleepy, go back to bed.  Make sure that you drive carefully. Avoid driving if you feel very sleepy.  Keep all follow-up appointments as directed by your health care provider. This is important. Contact a health care provider if:  You are tired throughout the day or have trouble in your daily routine due to sleepiness.  You continue to have sleep problems or your sleep problems get worse. Get help right away if:  You have serious thoughts about hurting yourself or someone else. This information is not intended to replace advice given to you by your health care provider. Make sure you discuss any questions you have with your health care provider. Document Released: 10/06/2000 Document Revised: 03/10/2016 Document Reviewed: 07/10/2014 Elsevier Interactive Patient Education  2017 [employer].

## 2017-03-21 NOTE — Progress Notes (Signed)
Subjective:   Samantha Stevenson is a 66 y.o. female who presents for Medicare Annual (Subsequent) preventive examination.  Review of Systems:  No ROS.  Medicare Wellness Visit.  Cardiac Risk Factors include: advanced age (>24men, >56 women);dyslipidemia;hypertension Sleep patterns: gets up 1 times nightly to void and sleeps 4-5 hours nightly.   Patient reports long-term insomnia issues, discussed recommended sleep tips and stress reduction tips, education was attached to patient's AVS.  Home Safety/Smoke Alarms:  Feels safe in home. Smoke alarms in place.   Living environment; residence and Firearm Safety: 1-story house/ trailer, no firearms. Lives alone, good family support, no needs for DME Seat Belt Safety/Bike Helmet: Wears seat belt.   Counseling:   Eye Exam- appointment yearly  Dental- appointment yearly  Female:   Pap-  N/A     Mammo-  Last 06/15/16, BI-RADS category 2 benign     Dexa scan- none, referral today        CCS- Last 09/10/12, diverticulosis, recall 10 years     Objective:     Vitals: BP 132/80   Pulse (!) 56   Resp 20   Ht 5\' 4"  (1.626 m)   Wt 168 lb (76.2 kg)   SpO2 99%   BMI 28.84 kg/m   Body mass index is 28.84 kg/m.   Tobacco History  Smoking Status  . Never Smoker  Smokeless Tobacco  . Never Used     Counseling given: Not Answered   Past Medical History:  Diagnosis Date  . Anemia   . Arthritis   . Atrial fibrillation ([place])   . Degenerative arthritis of hip    s/p L THR 12/2012  . Dyslipidemia   . GERD (gastroesophageal reflux disease)   . Hyperlipidemia   . Hypertension   . Hypertension   . Migraines   . Myocardial infarction ([place]) 2000   due to atrial fib  . Pulmonary emboli ([place]) 12/2012 dx   post op (L THR), anticoag x 53mo   Past Surgical History:  Procedure Laterality Date  . Breast Ultrasound Left 04/16/13   Done @ breast center Impression: no malignancy appearance noted on the screen study is consistent with a  summation shadow  . PARTIAL HYSTERECTOMY    . TONSILLECTOMY    . TOTAL HIP ARTHROPLASTY Left 01/03/2013   Procedure: TOTAL HIP ARTHROPLASTY ANTERIOR APPROACH;  Surgeon: Mcarthur Rossetti, MD;  Location: [facility];  Service: Orthopedics;  Laterality: Left;  Left Total Hip Arthroplasty, Anterior Approach  . VAGINA RECONSTRUCTION SURGERY     vagina had closed up when 66 years old   Family History  Problem Relation Age of Onset  . Heart disease Father   . Diabetes Mother   . Diabetes Sister   . Alcohol abuse Other   . Heart disease Other   . Hyperlipidemia Other   . Hypertension Other   . Diabetes Other   . Breast cancer Other    History  Sexual Activity  . Sexual activity: Not on file    Outpatient Encounter Prescriptions as of 03/21/2017  Medication Sig  . acetaminophen (TYLENOL) 500 MG tablet Take 500 mg by mouth as needed.  Marland Kitchen aspirin 325 MG tablet Take 325 mg by mouth daily.  Marland Kitchen DIGITEK 250 MCG tablet take 1 tablet by mouth every morning  . DILT-XR 240 MG 24 hr capsule take 1 capsule by mouth every morning  . ferrous sulfate 325 (65 FE) MG tablet Take 325 mg by mouth daily with breakfast.  .  furosemide (LASIX) 20 MG tablet Take 1 tablet (20 mg total) by mouth daily.  . Multiple Vitamin (MULTIVITAMIN) tablet Take 1 tablet by mouth daily.  . rosuvastatin (CRESTOR) 20 MG tablet take 1 tablet by mouth once daily  . traMADol (ULTRAM) 50 MG tablet Take 1-2 tablets (50-100 mg total) by mouth every 12 (twelve) hours as needed for moderate pain or severe pain.   No facility-administered encounter medications on file as of 03/21/2017.     Activities of Daily Living In your present state of health, do you have any difficulty performing the following activities: 03/21/2017  Hearing? N  Vision? N  Difficulty concentrating or making decisions? N  Walking or climbing stairs? N  Dressing or bathing? N  Doing errands, shopping? N  Preparing Food and eating ? N  Using the Toilet? N  In  the past six months, have you accidently leaked urine? N  Do you have problems with loss of bowel control? N  Managing your Medications? N  Managing your Finances? N  Housekeeping or managing your Housekeeping? N  Some recent data might be hidden    Patient Care Team: Hoyt Koch, MD as PCP - General (Internal Medicine) Mcarthur Rossetti, MD (Orthopedic Surgery) Tanda Rockers, MD (Pulmonary Disease) Inda Castle, MD (Gastroenterology)    Assessment:    Physical assessment deferred to PCP.  Exercise Activities and Dietary recommendations Current Exercise Habits: Home exercise routine, Type of exercise: strength training/weights;walking;calisthenics (Stationary bike), Time (Minutes): 50, Frequency (Times/Week): 4, Weekly Exercise (Minutes/Week): 200, Intensity: Mild, Exercise limited by: orthopedic condition(s)  Diet (meal preparation, eat out, water intake, caffeinated beverages, dairy products, fruits and vegetables): in general, a "healthy" diet  , well balanced, low fat/ cholesterol, low salt eats a variety of fruits and vegetables daily, limits salt, fat/cholesterol, sugar, caffeine, drinks 6-8 glasses of water daily.   Goals    . I would like to lose 15 pounds and firm up my body          I will continue to exercise and eat healthy      Fall Risk Fall Risk  03/21/2017  Falls in the past year? No   Depression Screen PHQ 2/9 Scores 03/21/2017  PHQ - 2 Score 0     Cognitive Function        Immunization History  Administered Date(s) Administered  . Influenza Whole 07/23/2012  . Influenza,inj,Quad PF,36+ Mos 07/16/2013, 06/19/2016  . Td 10/24/2011   Screening Tests Health Maintenance  Topic Date Due  . Hepatitis C Screening  1951-01-07  . HIV Screening  12/25/1967  . INFLUENZA VACCINE  05/23/2017  . MAMMOGRAM  06/15/2018  . TETANUS/TDAP  10/23/2021  . COLONOSCOPY  09/10/2022      Plan:    Continue to eat heart healthy diet (full of  fruits, vegetables, whole grains, lean protein, water--limit salt, fat, and sugar intake) and increase physical activity as tolerated.  Continue doing brain stimulating activities (puzzles, reading, adult coloring books, staying active) to keep memory sharp.    I have personally reviewed and noted the following in the patient's chart:   . Medical and social history . Use of alcohol, tobacco or illicit drugs  . Current medications and supplements . Functional ability and status . Nutritional status . Physical activity . Advanced directives . List of other physicians . Vitals . Screenings to include cognitive, depression, and falls . Referrals and appointments  In addition, I have reviewed and discussed with patient certain  preventive protocols, quality metrics, and best practice recommendations. A written personalized care plan for preventive services as well as general preventive health recommendations were provided to patient.     Michiel Cowboy, RN  03/21/2017

## 2017-03-22 NOTE — Progress Notes (Signed)
Medical screening examination/treatment/procedure(s) were performed by non-physician practitioner and as supervising physician I was immediately available for consultation/collaboration. I agree with above. Cosima A Jacquetta Polhamus, MD 

## 2017-03-28 ENCOUNTER — Ambulatory Visit (INDEPENDENT_AMBULATORY_CARE_PROVIDER_SITE_OTHER)
Admission: RE | Admit: 2017-03-28 | Discharge: 2017-03-28 | Disposition: A | Discharge status: Home / Self Care | Payer: Medicare PPO | Source: Ambulatory Visit | Attending: Internal Medicine | Admitting: Internal Medicine

## 2017-03-28 DIAGNOSIS — E2839 Other primary ovarian failure: Secondary | ICD-10-CM | POA: Diagnosis not present

## 2017-04-03 ENCOUNTER — Encounter: Payer: Self-pay | Admitting: Family

## 2017-07-28 LAB — HM MAMMOGRAPHY

## 2017-07-31 ENCOUNTER — Encounter: Payer: Self-pay | Admitting: Internal Medicine

## 2017-09-05 ENCOUNTER — Other Ambulatory Visit: Payer: Self-pay

## 2017-09-05 MED ORDER — ROSUVASTATIN CALCIUM 20 MG PO TABS: 20.0000 mg | ORAL_TABLET | Freq: Every day | ORAL | 0 refills | Status: DC

## 2018-01-03 ENCOUNTER — Encounter: Payer: Self-pay | Admitting: Internal Medicine

## 2018-01-03 ENCOUNTER — Other Ambulatory Visit (INDEPENDENT_AMBULATORY_CARE_PROVIDER_SITE_OTHER): Payer: Medicare Other

## 2018-01-03 ENCOUNTER — Ambulatory Visit (INDEPENDENT_AMBULATORY_CARE_PROVIDER_SITE_OTHER): Payer: Medicare Other | Admitting: Internal Medicine

## 2018-01-03 VITALS — BP 130/86 | HR 59 | Temp 97.8°F | Ht 64.0 in | Wt 172.0 lb

## 2018-01-03 DIAGNOSIS — I480 Paroxysmal atrial fibrillation: Secondary | ICD-10-CM | POA: Diagnosis not present

## 2018-01-03 DIAGNOSIS — E785 Hyperlipidemia, unspecified: Secondary | ICD-10-CM

## 2018-01-03 DIAGNOSIS — Z23 Encounter for immunization: Secondary | ICD-10-CM | POA: Diagnosis not present

## 2018-01-03 DIAGNOSIS — Z1159 Encounter for screening for other viral diseases: Secondary | ICD-10-CM

## 2018-01-03 DIAGNOSIS — I10 Essential (primary) hypertension: Secondary | ICD-10-CM

## 2018-01-03 LAB — COMPREHENSIVE METABOLIC PANEL
ALT: 11 U/L (ref 0–35)
AST: 14 U/L (ref 0–37)
Albumin: 3.8 g/dL (ref 3.5–5.2)
Alkaline Phosphatase: 92 U/L (ref 39–117)
BUN: 13 mg/dL (ref 6–23)
CO2: 31 mEq/L (ref 19–32)
Calcium: 9.5 mg/dL (ref 8.4–10.5)
Chloride: 102 mEq/L (ref 96–112)
Creatinine, Ser: 0.72 mg/dL (ref 0.40–1.20)
GFR: 86.4 mL/min (ref >60.00)
Glucose, Bld: 93 mg/dL (ref 70–99)
Potassium: 4.1 mEq/L (ref 3.5–5.1)
Sodium: 139 mEq/L (ref 135–145)
Total Bilirubin: 0.6 mg/dL (ref 0.2–1.2)
Total Protein: 7.3 g/dL (ref 6.0–8.3)

## 2018-01-03 LAB — CBC
HCT: 33.9 % — ABNORMAL LOW (ref 36.0–46.0)
Hemoglobin: 11 g/dL — ABNORMAL LOW (ref 12.0–15.0)
MCHC: 32.5 g/dL (ref 30.0–36.0)
MCV: 84.5 fl (ref 78.0–100.0)
Platelets: 322 10*3/uL (ref 150.0–400.0)
RBC: 4.01 Mil/uL (ref 3.87–5.11)
RDW: 15.8 % — ABNORMAL HIGH (ref 11.5–15.5)
WBC: 6.6 10*3/uL (ref 4.0–10.5)

## 2018-01-03 LAB — LIPID PANEL
Cholesterol: 189 mg/dL (ref 0–200)
HDL: 46.7 mg/dL (ref >39.00)
LDL Cholesterol: 117 mg/dL — ABNORMAL HIGH (ref 0–99)
NonHDL: 142.43
Total CHOL/HDL Ratio: 4
Triglycerides: 128 mg/dL (ref 0.0–149.0)
VLDL: 25.6 mg/dL (ref 0.0–40.0)

## 2018-01-03 MED ORDER — ROSUVASTATIN CALCIUM 20 MG PO TABS: 20.0000 mg | ORAL_TABLET | Freq: Every day | ORAL | 0 refills | Status: DC

## 2018-01-03 NOTE — Assessment & Plan Note (Signed)
Checking lipid panel and adjust crestor 20 mg daily as needed.  

## 2018-01-03 NOTE — Patient Instructions (Signed)
We have given you the pneumonia shot today and will check the labs. 

## 2018-01-03 NOTE — Assessment & Plan Note (Signed)
Taking diltiazem 240 mg daily and checking CMP and adjust as needed. BP at goal.

## 2018-01-03 NOTE — Assessment & Plan Note (Signed)
Checking digoxin level today and adjust as needed. Taking digoxin and diltiazem and in sinus today. On ASA 325.

## 2018-01-03 NOTE — Progress Notes (Signed)
   Subjective:    Patient ID: Samantha Stevenson, female    DOB: 02/16/1951, 67 y.o.   MRN: [id number]  HPI The patient is a 67 YO female coming in for medication refill as it has been about 1 year since last visit. She denies new concerns but has her blood pressure (taking diltiazem, denies chest pains or headaches), and her a fib (paroxsymal, risk low for bleeding, on aspirin 325, taking digoxin and diltiazem), and her cholesterol (taking crestor 20 mg daily, denies side effects, no CAD symptoms or stroke symptoms).   Review of Systems  Constitutional: Negative.   HENT: Negative.   Eyes: Negative.   Respiratory: Negative for cough, chest tightness and shortness of breath.   Cardiovascular: Negative for chest pain, palpitations and leg swelling.  Gastrointestinal: Negative for abdominal distention, abdominal pain, constipation, diarrhea, nausea and vomiting.  Musculoskeletal: Negative.   Skin: Negative.   Neurological: Negative.   Psychiatric/Behavioral: Negative.       Objective:   Physical Exam  Constitutional: She is oriented to person, place, and time. She appears well-developed and well-nourished.  HENT:  Head: Normocephalic and atraumatic.  Eyes: EOM are normal.  Neck: Normal range of motion.  Cardiovascular: Normal rate and regular rhythm.  Pulmonary/Chest: Effort normal and breath sounds normal. No respiratory distress. She has no wheezes. She has no rales.  Abdominal: Soft. Bowel sounds are normal. She exhibits no distension. There is no tenderness. There is no rebound.  Musculoskeletal: She exhibits no edema.  Neurological: She is alert and oriented to person, place, and time. Coordination normal.  Skin: Skin is warm and dry.  Psychiatric: She has a normal mood and affect.   Vitals:   01/03/18 1012  BP: 130/86  Pulse: (!) 59  Temp: 97.8 F (36.6 C)  TempSrc: Oral  SpO2: 99%  Weight: 172 lb (78 kg)  Height: 5\' 4"  (1.626 m)      Assessment & Plan:  Prevnar 13  given at visit

## 2018-01-04 LAB — HEPATITIS C ANTIBODY
Hepatitis C Ab: NONREACTIVE
SIGNAL TO CUT-OFF: 0.03 (ref <1.00)

## 2018-01-04 LAB — DIGOXIN LEVEL: Digoxin Level: 0.8 mcg/L (ref 0.8–2.0)

## 2018-01-21 DIAGNOSIS — L708 Other acne: Secondary | ICD-10-CM | POA: Diagnosis not present

## 2018-01-21 DIAGNOSIS — L820 Inflamed seborrheic keratosis: Secondary | ICD-10-CM | POA: Diagnosis not present

## 2018-01-21 DIAGNOSIS — M7130 Other bursal cyst, unspecified site: Secondary | ICD-10-CM | POA: Diagnosis not present

## 2018-02-14 ENCOUNTER — Ambulatory Visit (HOSPITAL_COMMUNITY)
Admission: EM | Admit: 2018-02-14 | Discharge: 2018-02-14 | Disposition: A | Discharge status: Home / Self Care | Payer: Medicare Other | Attending: Internal Medicine | Admitting: Internal Medicine

## 2018-02-14 ENCOUNTER — Encounter (HOSPITAL_COMMUNITY): Payer: Self-pay | Admitting: Family Medicine

## 2018-02-14 DIAGNOSIS — H66001 Acute suppurative otitis media without spontaneous rupture of ear drum, right ear: Secondary | ICD-10-CM | POA: Diagnosis not present

## 2018-02-14 DIAGNOSIS — H60393 Other infective otitis externa, bilateral: Secondary | ICD-10-CM

## 2018-02-14 MED ORDER — NEOMYCIN-POLYMYXIN-HC 3.5-10000-1 OT SUSP: 4.0000 [drp] | Freq: Four times a day (QID) | OTIC | 0 refills | Status: AC

## 2018-02-14 MED ORDER — DOXYCYCLINE HYCLATE 100 MG PO CAPS: 100.0000 mg | ORAL_CAPSULE | Freq: Two times a day (BID) | ORAL | 0 refills | Status: DC

## 2018-02-14 MED ORDER — CETIRIZINE HCL 10 MG PO TABS: 10.0000 mg | ORAL_TABLET | Freq: Every day | ORAL | 0 refills | Status: DC

## 2018-02-14 MED ORDER — TRIAMCINOLONE ACETONIDE 55 MCG/ACT NA AERO: 2.0000 | INHALATION_SPRAY | Freq: Every day | NASAL | 12 refills | Status: DC

## 2018-02-14 NOTE — ED Triage Notes (Signed)
Pt here for bilateral ear pain, congestion, runny nose, sore throat.

## 2018-02-14 NOTE — Discharge Instructions (Signed)
Start doxycycline for inner right ear infection. Start cortisporin for outer ear infection to both ears. Start zyrtec and nasacort for nasal congestion/drainage. You can use over the counter nasal saline rinse such as neti pot for nasal congestion. Keep hydrated, your urine should be clear to pale yellow in color. Tylenol/motrin for fever and pain.  If experiencing worsening symptoms, chest pain, shortness of breath, palpitation, one-sided leg swelling, go to the emergency department for further evaluation.  For sore throat try using a honey-based tea. Use 3 teaspoons of honey with juice squeezed from half lemon. Place shaved pieces of ginger into 1/2-1 cup of water and warm over stove top. Then mix the ingredients and repeat every 4 hours as needed.

## 2018-02-14 NOTE — ED Provider Notes (Signed)
[name]    CSN: [id number] Arrival date & time: 02/14/18  1526     History   Chief Complaint Chief Complaint  Patient presents with  . Otalgia    HPI Samantha Stevenson is a 67 y.o. female.   67 year old female comes in for 9 to 10-day history of URI symptoms.  Patient has had sore throat, nasal congestion, rhinorrhea, bilateral ear pain, sinus pressure, cough.  Denies fever, chills, night sweats.  OTC sinus medication without relief. Never smoker.  She denies chest pain, shortness of breath, wheezing, palpitation.     Past Medical History:  Diagnosis Date  . Anemia   . Arthritis   . Atrial fibrillation ([place])   . Degenerative arthritis of hip    s/p L THR 12/2012  . Dyslipidemia   . GERD (gastroesophageal reflux disease)   . Hyperlipidemia   . Hypertension   . Hypertension   . Migraines   . Myocardial infarction ([place]) 2000   due to atrial fib  . Pulmonary emboli ([place]) 12/2012 dx   post op (L THR), anticoag x 55mo    Patient Active Problem List   Diagnosis Date Noted  . Dyspnea 06/19/2016  . Routine general medical examination at a health care facility 06/24/2015  . Constipation 03/17/2015  . Myalgia and myositis 03/17/2015  . Dyslipidemia   . Atrial fibrillation ([place])   . Hypertension   . Pulmonary emboli ([place]) 01/10/2013  . Anemia 01/10/2013  . Arthritis 01/03/2013    Past Surgical History:  Procedure Laterality Date  . Breast Ultrasound Left 04/16/13   Done @ breast center Impression: no malignancy appearance noted on the screen study is consistent with a summation shadow  . PARTIAL HYSTERECTOMY    . TONSILLECTOMY    . TOTAL HIP ARTHROPLASTY Left 01/03/2013   Procedure: TOTAL HIP ARTHROPLASTY ANTERIOR APPROACH;  Surgeon: Mcarthur Rossetti, MD;  Location: [facility];  Service: Orthopedics;  Laterality: Left;  Left Total Hip Arthroplasty, Anterior Approach  . VAGINA RECONSTRUCTION SURGERY     vagina had closed up when 67 years old    OB  History   None      Home Medications    Prior to Admission medications   Medication Sig Start Date End Date Taking? Authorizing Provider  acetaminophen (TYLENOL) 500 MG tablet Take 500 mg by mouth as needed.    [provider]  aspirin 325 MG tablet Take 325 mg by mouth daily.    [provider]  cetirizine (ZYRTEC) 10 MG tablet Take 1 tablet (10 mg total) by mouth daily. 02/14/18   Ok Edwards, PA-C  DIGITEK 250 MCG tablet take 1 tablet by mouth every morning 02/22/17   Hoyt Koch, MD  DILT-XR 240 MG 24 hr capsule take 1 capsule by mouth every morning 02/12/17   Hoyt Koch, MD  doxycycline (VIBRAMYCIN) 100 MG capsule Take 1 capsule (100 mg total) by mouth 2 (two) times daily. 02/14/18   Ok Edwards, PA-C  ferrous sulfate 325 (65 FE) MG tablet Take 325 mg by mouth daily with breakfast. 01/05/13   Pete Pelt, PA-C  Multiple Vitamin (MULTIVITAMIN) tablet Take 1 tablet by mouth daily.    [provider]  neomycin-polymyxin-hydrocortisone (CORTISPORIN) 3.5-10000-1 OTIC suspension Place 4 drops into both ears 4 (four) times daily for 7 days. 02/14/18 02/21/18  Tasia Catchings, Risha Barretta V, PA-C  rosuvastatin (CRESTOR) 20 MG tablet Take 1 tablet (20 mg total) by mouth daily. Need annual visit for  further refills 01/03/18   Hoyt Koch, MD  triamcinolone (NASACORT) 55 MCG/ACT AERO nasal inhaler Place 2 sprays into the nose daily. 02/14/18   Ok Edwards, PA-C    Family History Family History  Problem Relation Age of Onset  . Heart disease Father   . Diabetes Mother   . Diabetes Sister   . Alcohol abuse Other   . Heart disease Other   . Hyperlipidemia Other   . Hypertension Other   . Diabetes Other   . Breast cancer Other     Social History Social History   Tobacco Use  . Smoking status: Never Smoker  . Smokeless tobacco: Never Used  Substance Use Topics  . Alcohol use: No  . Drug use: No     Allergies   Latex; Penicillins; Asa [aspirin]; Celebrex  [celecoxib]; and Nsaids   Review of Systems Review of Systems  Reason unable to perform ROS: See HPI as above.     Physical Exam Triage Vital Signs ED Triage Vitals  Enc Vitals Group     BP 02/14/18 1549 (!) 144/80     Pulse Rate 02/14/18 1549 78     Resp 02/14/18 1549 18     Temp 02/14/18 1549 99.5 F (37.5 C)     Temp src --      SpO2 02/14/18 1549 100 %     Weight --      Height --      Head Circumference --      Peak Flow --      Pain Score 02/14/18 1548 10     Pain Loc --      Pain Edu? --      Excl. in [place]? --    No data found.  Updated Vital Signs BP (!) 144/80   Pulse 78   Temp 99.5 F (37.5 C)   Resp 18   SpO2 100%   Physical Exam  Constitutional: She is oriented to person, place, and time. She appears well-developed and well-nourished. No distress.  HENT:  Head: Normocephalic and atraumatic.  Right Ear: External ear normal. Tympanic membrane is erythematous and bulging.  Left Ear: Tympanic membrane and external ear normal. Tympanic membrane is not erythematous and not bulging.  Nose: Nose normal. Right sinus exhibits no maxillary sinus tenderness and no frontal sinus tenderness. Left sinus exhibits no maxillary sinus tenderness and no frontal sinus tenderness.  Mouth/Throat: Uvula is midline, oropharynx is clear and moist and mucous membranes are normal.  Right tragus tender to palpation.  No tenderness to palpation of left tragus.  Bilateral ear canal with erythema and mild swelling.  Eyes: Pupils are equal, round, and reactive to light. Conjunctivae are normal.  Neck: Normal range of motion. Neck supple.  Cardiovascular: Normal rate, regular rhythm and normal heart sounds. Exam reveals no gallop and no friction rub.  No murmur heard. Pulmonary/Chest: Effort normal and breath sounds normal. She has no decreased breath sounds. She has no wheezes. She has no rhonchi. She has no rales.  Lymphadenopathy:    She has no cervical adenopathy.  Neurological:  She is alert and oriented to person, place, and time.  Skin: Skin is warm and dry.  Psychiatric: She has a normal mood and affect. Her behavior is normal. Judgment normal.     UC Treatments / Results  Labs (all labs ordered are listed, but only abnormal results are displayed) Labs Reviewed - No data to display  EKG None Radiology No results found.  Procedures Procedures (including critical care time)  Medications Ordered in UC Medications - No data to display   Initial Impression / Assessment and Plan / UC Course  I have reviewed the triage vital signs and the nursing notes.  Pertinent labs & imaging results that were available during my care of the patient were reviewed by me and considered in my medical decision making (see chart for details).    Patient with severe PCN allergy, on digoxin, will avoid PCN and azithromycin.  Start doxycycline for otitis media.  Cortisporin for otitis externa.  Other symptomatic treatment discussed.  Push fluids.  Return precautions given.  Patient expresses understanding and agrees to plan.  Final Clinical Impressions(s) / UC Diagnoses   Final diagnoses:  Non-recurrent acute suppurative otitis media of right ear without spontaneous rupture of tympanic membrane  Other infective acute otitis externa of both ears    ED Discharge Orders        Ordered    triamcinolone (NASACORT) 55 MCG/ACT AERO nasal inhaler  Daily     02/14/18 1657    neomycin-polymyxin-hydrocortisone (CORTISPORIN) 3.5-10000-1 OTIC suspension  4 times daily     02/14/18 1657    cetirizine (ZYRTEC) 10 MG tablet  Daily     02/14/18 1657    doxycycline (VIBRAMYCIN) 100 MG capsule  2 times daily     02/14/18 1657        Ok Edwards, PA-C 02/14/18 1702

## 2018-02-18 DIAGNOSIS — M79645 Pain in left finger(s): Secondary | ICD-10-CM | POA: Diagnosis not present

## 2018-02-18 DIAGNOSIS — M67442 Ganglion, left hand: Secondary | ICD-10-CM | POA: Diagnosis not present

## 2018-02-25 ENCOUNTER — Telehealth: Payer: Self-pay

## 2018-02-25 NOTE — Telephone Encounter (Signed)
Per Pre Op Protocol pt will need an appt to see Dr. Curt Bears before being cleared for surgery. Pt last seen 06/2016. Per last OV note 06/2016 pt was supposed to f/u 3 months later though pt did not follow up. I will route this message to Lorenda Hatchet, EP scheduler and ask her to please call the pt with appt.

## 2018-02-25 NOTE — Telephone Encounter (Signed)
    [facility] Pre-operative Risk Assessment    Request for surgical clearance:  1. What type of surgery is being performed? Left thumb joint arthrotomy and mass removal   2. When is this surgery scheduled? 03/04/18   3. What type of clearance is required (medical clearance vs. Pharmacy clearance to hold med vs. Both)? Medical Clearance  4. Are there any medications that need to be held prior to surgery and how long? None Specified    5. Practice name and name of physician performing surgery? [facility] , Dr. Iran Planas   6. What is your office phone number [phone]    7.   What is your office fax number [phone] Attn: Santiago Bur  8.   Anesthesia type (None, local, MAC, general) ? Local with IV sedation    Samantha Stevenson 02/25/2018, 12:00 PM  _________________________________________________________________   (provider comments below)

## 2018-02-25 NOTE — Telephone Encounter (Signed)
   Primary Goree, MD (Last seen 06/2016)  Chart reviewed as part of pre-operative protocol coverage. Because of Samantha Stevenson's past medical history and time since last visit, he/she will require a follow-up visit in order to better assess preoperative cardiovascular risk.  Pre-op covering staff: - Please schedule appointment and call patient to inform them. - Please contact requesting surgeon's office via preferred method (i.e, phone, fax) to inform them of need for appointment prior to surgery.  [place], [place]  02/25/2018, 3:36 PM

## 2018-02-26 ENCOUNTER — Encounter: Payer: Self-pay | Admitting: Physician Assistant

## 2018-02-27 ENCOUNTER — Encounter: Payer: Self-pay | Admitting: Physician Assistant

## 2018-02-27 ENCOUNTER — Ambulatory Visit (INDEPENDENT_AMBULATORY_CARE_PROVIDER_SITE_OTHER): Payer: Medicare Other | Admitting: Physician Assistant

## 2018-02-27 VITALS — BP 136/82 | HR 70 | Ht 64.0 in | Wt 171.8 lb

## 2018-02-27 DIAGNOSIS — Z86711 Personal history of pulmonary embolism: Secondary | ICD-10-CM

## 2018-02-27 DIAGNOSIS — Z01818 Encounter for other preprocedural examination: Secondary | ICD-10-CM | POA: Diagnosis not present

## 2018-02-27 DIAGNOSIS — I480 Paroxysmal atrial fibrillation: Secondary | ICD-10-CM | POA: Diagnosis not present

## 2018-02-27 DIAGNOSIS — R600 Localized edema: Secondary | ICD-10-CM

## 2018-02-27 DIAGNOSIS — E785 Hyperlipidemia, unspecified: Secondary | ICD-10-CM

## 2018-02-27 DIAGNOSIS — I10 Essential (primary) hypertension: Secondary | ICD-10-CM | POA: Diagnosis not present

## 2018-02-27 MED ORDER — RIVAROXABAN 20 MG PO TABS: 20.0000 mg | ORAL_TABLET | Freq: Every day | ORAL | 6 refills | Status: DC

## 2018-02-27 MED ORDER — RIVAROXABAN 20 MG PO TABS: 20.0000 mg | ORAL_TABLET | Freq: Every day | ORAL | 0 refills | Status: DC

## 2018-02-27 NOTE — Patient Instructions (Addendum)
Medication Instructions:  Your physician has recommended you make the following change in your medication:  1. STOP Aspirin 2. START Xarelto 20 mg once daily  Labwork: None ordered  Testing/Procedures: None ordered  Follow-Up: Your physician recommends that you schedule a follow-up appointment in: one month with pharmacist for Xarelto start follow up  Your physician wants you to follow-up in: 6 months with Dr. Curt Bears.  You will receive a reminder letter in the mail two months in advance. If you don't receive a letter, please call our office to schedule the follow-up appointment.   * If you need a refill on your cardiac medications before your next appointment, please call your pharmacy.   *Please note that any paperwork needing to be filled out by the provider will need to be addressed at the front desk prior to seeing the provider. Please note that any FMLA, disability or other documents regarding health condition is subject to a $25.00 charge that must be received prior to completion of paperwork in the form of a money order or check.  Thank you for choosing [facility]!!    Any Other Special Instructions Will Be Listed Below (If Applicable).  Rivaroxaban oral tablets What is this medicine? RIVAROXABAN (ri va ROX a ban) is an anticoagulant (blood thinner). It is used to treat blood clots in the lungs or in the veins. It is also used after knee or hip surgeries to prevent blood clots. It is also used to lower the chance of stroke in people with a medical condition called atrial fibrillation. This medicine may be used for other purposes; ask your health care provider or pharmacist if you have questions. COMMON BRAND NAME(S): Xarelto, Xarelto Starter Pack What should I tell my health care provider before I take this medicine? They need to know if you have any of these conditions: -bleeding disorders -bleeding in the brain -blood in your stools (black or tarry stools) or if you  have blood in your vomit -history of stomach bleeding -kidney disease -liver disease -low blood counts, like low white cell, platelet, or red cell counts -recent or planned spinal or epidural procedure -take medicines that treat or prevent blood clots -an unusual or allergic reaction to rivaroxaban, other medicines, foods, dyes, or preservatives -pregnant or trying to get pregnant -breast-feeding How should I use this medicine? Take this medicine by mouth with a glass of water. Follow the directions on the prescription label. Take your medicine at regular intervals. Do not take it more often than directed. Do not stop taking except on your doctor's advice. Stopping this medicine may increase your risk of a blood clot. Be sure to refill your prescription before you run out of medicine. If you are taking this medicine after hip or knee replacement surgery, take it with or without food. If you are taking this medicine for atrial fibrillation, take it with your evening meal. If you are taking this medicine to treat blood clots, take it with food at the same time each day. If you are unable to swallow your tablet, you may crush the tablet and mix it in applesauce. Then, immediately eat the applesauce. You should eat more food right after you eat the applesauce containing the crushed tablet. Talk to your pediatrician regarding the use of this medicine in children. Special care may be needed. Overdosage: If you think you have taken too much of this medicine contact a poison control center or emergency room at once. NOTE: This medicine is only for  you. Do not share this medicine with others. What if I miss a dose? If you take your medicine once a day and miss a dose, take the missed dose as soon as you remember. If you take your medicine twice a day and miss a dose, take the missed dose immediately. In this instance, 2 tablets may be taken at the same time. The next day you should take 1 tablet twice a day  as directed. What may interact with this medicine? Do not take this medicine with any of the following medications: -defibrotide This medicine may also interact with the following medications: -aspirin and aspirin-like medicines -certain antibiotics like erythromycin, azithromycin, and clarithromycin -certain medicines for fungal infections like ketoconazole and itraconazole -certain medicines for irregular heart beat like amiodarone, quinidine, dronedarone -certain medicines for seizures like carbamazepine, phenytoin -certain medicines that treat or prevent blood clots like warfarin, enoxaparin, and dalteparin -conivaptan -diltiazem -felodipine -indinavir -lopinavir; ritonavir -NSAIDS, medicines for pain and inflammation, like ibuprofen or naproxen -ranolazine -rifampin -ritonavir -SNRIs, medicines for depression, like desvenlafaxine, duloxetine, levomilnacipran, venlafaxine -SSRIs, medicines for depression, like citalopram, escitalopram, fluoxetine, fluvoxamine, paroxetine, sertraline -St. John's wort -verapamil This list may not describe all possible interactions. Give your health care provider a list of all the medicines, herbs, non-prescription drugs, or dietary supplements you use. Also tell them if you smoke, drink alcohol, or use illegal drugs. Some items may interact with your medicine. What should I watch for while using this medicine? Visit your doctor or health care professional for regular checks on your progress. Notify your doctor or health care professional and seek emergency treatment if you develop breathing problems; changes in vision; chest pain; severe, sudden headache; pain, swelling, warmth in the leg; trouble speaking; sudden numbness or weakness of the face, arm or leg. These can be signs that your condition has gotten worse. If you are going to have surgery or other procedure, tell your doctor that you are taking this medicine. What side effects may I notice  from receiving this medicine? Side effects that you should report to your doctor or health care professional as soon as possible: -allergic reactions like skin rash, itching or hives, swelling of the face, lips, or tongue -back pain -redness, blistering, peeling or loosening of the skin, including inside the mouth -signs and symptoms of bleeding such as bloody or black, tarry stools; red or dark-brown urine; spitting up blood or brown material that looks like coffee grounds; red spots on the skin; unusual bruising or bleeding from the eye, gums, or nose Side effects that usually do not require medical attention (report to your doctor or health care professional if they continue or are bothersome): -dizziness -muscle pain This list may not describe all possible side effects. Call your doctor for medical advice about side effects. You may report side effects to FDA at 1-800-FDA-1088. Where should I keep my medicine? Keep out of the reach of children. Store at room temperature between 15 and 30 degrees C (59 and 86 degrees F). Throw away any unused medicine after the expiration date. NOTE: This sheet is a summary. It may not cover all possible information. If you have questions about this medicine, talk to your doctor, pharmacist, or health care provider.  2018 Elsevier/Gold Standard (2016-06-28 16:29:33)

## 2018-02-27 NOTE — Progress Notes (Signed)
Cardiology Office Note    Date:  02/27/2018   ID:  Samantha Stevenson, DOB 10-28-1950, MRN [id number]  PCP:  Hoyt Koch, MD  Cardiologist: [name], MD  No chief complaint on file.   History of Present Illness:  Samantha Stevenson is a 67 y.o. female history of PAF CHA2DS2-VASc was 2 then.  Last saw Dr. Curt Bears 2017 and was in normal sinus rhythm.  Was complaining of shortness of breath when talking to people.  He had no records of her ever being in atrial fibrillation but felt like she would benefit from anticoagulation in the future.  Records from 2014 obtained in both EKG showed normal sinus rhythm.  She declined anticoagulation.  Patient also has hypertension and hyperlipidemia pulmonary embolus in 2014 after hip replacement treated with anticoagulation for 6 months.  2D echo showed normal LV function ejection fraction 55% Doppler parameters consistent with elevated ventricular end-diastolic filling pressures and elevated left atrial filling pressures.  Left atrium was normal in size.  Patient is here today for preoperative clearance before undergoing left thumb arthrotomy and mass removal by Dr. Laurier Nancy 03/04/2018. CHADSVASC now =5 age, female, PE, HTN. Denies any chest pain, palpitations, dyspnea, dypnea on exertion. Walks 1 block/day with dog. Went to [employer] [place] [employer] last month and walked a lot without problems.  Has chronic leg edema for which she takes Lasix and wears compression stockings although she is not wearing them today.  Says her palpitations are controlled as long as she is takes her diltiazem and digoxin.  If she would not take them for a week she would be in atrial fibrillation.    Past Medical History:  Diagnosis Date  . Anemia   . Arthritis   . Atrial fibrillation ([place])   . Degenerative arthritis of hip    s/p L THR 12/2012  . Dyslipidemia   . GERD (gastroesophageal reflux disease)   . Hyperlipidemia   . Hypertension   . Hypertension   . Migraines    . Myocardial infarction ([place]) 2000   due to atrial fib  . Pulmonary emboli ([place]) 12/2012 dx   post op (L THR), anticoag x 42mo    Past Surgical History:  Procedure Laterality Date  . Breast Ultrasound Left 04/16/13   Done @ breast center Impression: no malignancy appearance noted on the screen study is consistent with a summation shadow  . PARTIAL HYSTERECTOMY    . TONSILLECTOMY    . TOTAL HIP ARTHROPLASTY Left 01/03/2013   Procedure: TOTAL HIP ARTHROPLASTY ANTERIOR APPROACH;  Surgeon: Mcarthur Rossetti, MD;  Location: [facility];  Service: Orthopedics;  Laterality: Left;  Left Total Hip Arthroplasty, Anterior Approach  . VAGINA RECONSTRUCTION SURGERY     vagina had closed up when 67 years old    Current Medications: Current Meds  Medication Sig  . acetaminophen (TYLENOL) 500 MG tablet Take 500 mg by mouth as needed.  Marland Kitchen aspirin 325 MG tablet Take 325 mg by mouth daily.  Marland Kitchen DIGITEK 250 MCG tablet take 1 tablet by mouth every morning  . DILT-XR 240 MG 24 hr capsule take 1 capsule by mouth every morning  . ferrous sulfate 325 (65 FE) MG tablet Take 325 mg by mouth daily with breakfast.  . Multiple Vitamin (MULTIVITAMIN) tablet Take 1 tablet by mouth daily.  . rosuvastatin (CRESTOR) 20 MG tablet Take 1 tablet by mouth daily.     Allergies:   Latex; Penicillins; Asa [aspirin]; Celebrex [celecoxib]; and Nsaids   Social History  Socioeconomic History  . Marital status: Single    Spouse name: Not on file  . Number of children: Not on file  . Years of education: Not on file  . Highest education level: Not on file  Occupational History  . Not on file  Social Needs  . Financial resource strain: Not on file  . Food insecurity:    Worry: Not on file    Inability: Not on file  . Transportation needs:    Medical: Not on file    Non-medical: Not on file  Tobacco Use  . Smoking status: Never Smoker  . Smokeless tobacco: Never Used  Substance and Sexual Activity  . Alcohol use: No   . Drug use: No  . Sexual activity: Not on file  Lifestyle  . Physical activity:    Days per week: Not on file    Minutes per session: Not on file  . Stress: Not on file  Relationships  . Social connections:    Talks on phone: Not on file    Gets together: Not on file    Attends religious service: Not on file    Active member of club or organization: Not on file    Attends meetings of clubs or organizations: Not on file    Relationship status: Not on file  Other Topics Concern  . Not on file  Social History Narrative  . Not on file     Family History:  The patient's family history includes Alcohol abuse in her other; Breast cancer in her other; Diabetes in her mother, other, and sister; Heart disease in her father and other; Hyperlipidemia in her other; Hypertension in her other.   ROS:   Please see the history of present illness.    Review of Systems  Constitution: Negative.  HENT: Negative.   Eyes: Negative.   Cardiovascular: Positive for leg swelling.  Respiratory: Negative.   Hematologic/Lymphatic: Negative.   Musculoskeletal: Positive for joint pain.  Gastrointestinal: Negative.   Genitourinary: Negative.   Neurological: Negative.    All other systems reviewed and are negative.   PHYSICAL EXAM:   VS:  BP 136/82   Pulse 70   Ht 5\' 4"  (1.626 m)   Wt 171 lb 12.8 oz (77.9 kg)   SpO2 95%   BMI 29.49 kg/m   Physical Exam  GEN: Well nourished, well developed, in no acute distress  Neck: no JVD, carotid bruits, or masses Cardiac:RRR; 1/6 systolic murmur at the left sternal border, no rubs, or gallops  Respiratory:  clear to auscultation bilaterally, normal work of breathing GI: soft, nontender, nondistended, + BS Ext: +1-2 edema bilaterally up to her knees without cyanosis, clubbing, Good distal pulses bilaterally Neuro:  Alert and Oriented x 3 Psych: euthymic mood, full affect  Wt Readings from Last 3 Encounters:  02/27/18 171 lb 12.8 oz (77.9 kg)  01/03/18  172 lb (78 kg)  03/21/17 168 lb (76.2 kg)      Studies/Labs Reviewed:   EKG:  EKG is ordered today.  The ekg ordered today demonstrates normal sinus rhythm, nonspecific ST-T wave changes, normal EKG  Recent Labs: 01/03/2018: ALT 11; BUN 13; Creatinine, Ser 0.72; Hemoglobin 11.0; Platelets 322.0; Potassium 4.1; Sodium 139   Lipid Panel    Component Value Date/Time   CHOL 189 01/03/2018 1044   TRIG 128.0 01/03/2018 1044   HDL 46.70 01/03/2018 1044   CHOLHDL 4 01/03/2018 1044   VLDL 25.6 01/03/2018 1044   LDLCALC 117 (H) 01/03/2018 1044  Additional studies/ records that were reviewed today include:  2D echo 07/19/2016 Study Conclusions   - Left ventricle: Distal septal hypokinesis. The cavity size was   normal. Systolic function was normal. The estimated ejection   fraction was 55%. Wall motion was normal; there were no regional   wall motion abnormalities. Doppler parameters are consistent with   both elevated ventricular end-diastolic filling pressure and   elevated left atrial filling pressure. - Atrial septum: No defect or patent foramen ovale was identified. - Pericardium, extracardiac: A trivial pericardial effusion was   identified posterior to the heart.      ASSESSMENT:    1. Paroxysmal atrial fibrillation (HCC)   2. Preoperative clearance   3. Essential hypertension   4. History of pulmonary embolism   5. Dyslipidemia      PLAN:  In order of problems listed above:  Preoperative clearance before undergoing thumb surgery 03/04/2018 by Dr. Apolonio Schneiders.  Based on revised cardiac risk index she does not need further work-up for undergoing thumb surgery.  She has a METS of over 6.  No history of CAD.  She does have PAF now with CHA2DS2-VASc equal to 5.  Now agreeable to start blood thinner and will begin 24 hours after surgery if okay with surgeons. According to the Revised Cardiac Risk Index (RCRI), her Perioperative Risk of Major Cardiac Event is (%): 0.4  Her  Functional Capacity in METs is: 6.05 according to the Duke Activity Status Index (DASI).   PAF documented before she was seen in our office but we have no documentation of this.  Has refused anticoagulation in the past.  CHA2DS2-VASc equals 5 for female, age, hypertension and history of pulmonary embolism.  Puts her at much higher risk of CVA.  She is now willing to take Xarelto 20 mg once daily(doesn't want to take BID drug).  Discussed with  pharmacy who recommend she start 24 hours after her surgery if no bleeding complications.  Follow-up in our Coumadin clinic for 1 month's Xarelto checkup.  Stop aspirin.  Follow-up with Dr. Curt Bears in 6 months.  Essential hypertension blood pressure well controlled.  History of pulmonary embolism 2014 after hip replacement on anticoagulation for 6 months.  Dyslipidemia on Crestor LDL 117 01/03/2018.  Managed by primary care.    Medication Adjustments/Labs and Tests Ordered: Current medicines are reviewed at length with the patient today.  Concerns regarding medicines are outlined above.  Medication changes, Labs and Tests ordered today are listed in the Patient Instructions below. There are no Patient Instructions on file for this visit.   Signed, Ermalinda Barrios, PA-C  02/27/2018 2:15 PM    [facility] [address], [place], [place]  [address] Phone: [phone]; Fax: [phone]

## 2018-03-04 DIAGNOSIS — M67442 Ganglion, left hand: Secondary | ICD-10-CM | POA: Diagnosis not present

## 2018-03-04 DIAGNOSIS — M19042 Primary osteoarthritis, left hand: Secondary | ICD-10-CM | POA: Diagnosis not present

## 2018-03-05 ENCOUNTER — Other Ambulatory Visit: Payer: Self-pay | Admitting: Internal Medicine

## 2018-03-12 ENCOUNTER — Ambulatory Visit (INDEPENDENT_AMBULATORY_CARE_PROVIDER_SITE_OTHER): Payer: Medicare Other | Admitting: Pharmacist

## 2018-03-12 VITALS — Wt 170.2 lb

## 2018-03-12 DIAGNOSIS — I4891 Unspecified atrial fibrillation: Secondary | ICD-10-CM | POA: Diagnosis not present

## 2018-03-12 NOTE — Patient Instructions (Signed)
It was nice to meet you today  Continue taking your Xarelto 20mg  each day  We will recheck your lab work in another 6 months and can schedule this visit the same day you come in to see Dr Curt Bears  Call Jinny Blossom, Pharmacist with any Xarelto concerns [phone]

## 2018-03-12 NOTE — Progress Notes (Signed)
Pt was started on Xarelto 20mg  daily for afib with planned starting date 03/05/18, the day after her thumb surgery. Pt reports she did not start taking Xarelto until 5/17.  Reviewed patients medication list.  Pt is not currently on any combined P-gp and strong CYP3A4 inhibitors/inducers (ketoconazole, traconazole, ritonavir, carbamazepine, phenytoin, rifampin, St. John's wort).  Reviewed labs.  SCr 0.8, Weight 170 lb 4 oz, CrCl 93mL/min.  Dose appropriate based on CrCl.   Hgb and HCT WNL.  A full discussion of the nature of anticoagulants has been carried out.  A benefit/risk analysis has been presented to the patient, so that they understand the justification for choosing anticoagulation with Xarelto at this time.  The need for compliance is stressed.  Pt is aware to take the medication once daily with the largest meal of the day.  Side effects of potential bleeding are discussed, including unusual colored urine or stools, coughing up blood or coffee ground emesis, nose bleeds or serious fall or head trauma.  Discussed signs and symptoms of stroke. The patient should avoid any OTC items containing aspirin or ibuprofen.  Avoid alcohol consumption.   Call if any signs of abnormal bleeding.  Discussed financial obligations and resolved any difficulty in obtaining medication.  Next lab test in 6 months.

## 2018-03-13 LAB — BASIC METABOLIC PANEL
BUN/Creatinine Ratio: 16 (ref 12–28)
BUN: 13 mg/dL (ref 8–27)
CO2: 25 mmol/L (ref 20–29)
Calcium: 9.1 mg/dL (ref 8.7–10.3)
Chloride: 103 mmol/L (ref 96–106)
Creatinine, Ser: 0.8 mg/dL (ref 0.57–1.00)
GFR calc Af Amer: 89 mL/min/{1.73_m2} (ref >59)
GFR calc non Af Amer: 78 mL/min/{1.73_m2} (ref >59)
Glucose: 62 mg/dL — ABNORMAL LOW (ref 65–99)
Potassium: 4.2 mmol/L (ref 3.5–5.2)
Sodium: 141 mmol/L (ref 134–144)

## 2018-03-13 LAB — CBC
Hematocrit: 32.5 % — ABNORMAL LOW (ref 34.0–46.6)
Hemoglobin: 10.1 g/dL — ABNORMAL LOW (ref 11.1–15.9)
MCH: 26.8 pg (ref 26.6–33.0)
MCHC: 31.1 g/dL — ABNORMAL LOW (ref 31.5–35.7)
MCV: 86 fL (ref 79–97)
Platelets: 317 10*3/uL (ref 150–450)
RBC: 3.77 x10E6/uL (ref 3.77–5.28)
RDW: 15.9 % — ABNORMAL HIGH (ref 12.3–15.4)
WBC: 6.3 10*3/uL (ref 3.4–10.8)

## 2018-03-19 DIAGNOSIS — M67442 Ganglion, left hand: Secondary | ICD-10-CM | POA: Diagnosis not present

## 2018-03-19 DIAGNOSIS — M1990 Unspecified osteoarthritis, unspecified site: Secondary | ICD-10-CM | POA: Diagnosis not present

## 2018-03-19 DIAGNOSIS — M19042 Primary osteoarthritis, left hand: Secondary | ICD-10-CM | POA: Diagnosis not present

## 2018-03-19 DIAGNOSIS — Z4789 Encounter for other orthopedic aftercare: Secondary | ICD-10-CM | POA: Diagnosis not present

## 2018-03-28 ENCOUNTER — Ambulatory Visit (INDEPENDENT_AMBULATORY_CARE_PROVIDER_SITE_OTHER): Payer: Medicare Other | Admitting: *Deleted

## 2018-03-28 VITALS — BP 116/62 | HR 70 | Resp 17 | Ht 64.0 in | Wt 168.0 lb

## 2018-03-28 DIAGNOSIS — Z0000 Encounter for general adult medical examination without abnormal findings: Secondary | ICD-10-CM

## 2018-03-28 NOTE — Progress Notes (Signed)
Medical screening examination/treatment/procedure(s) were performed by the Wellness Coach, RN. As primary care provider I was immediately available for consulation/collaboration. I agree with above documentation. Lizeth Bencosme, NP  

## 2018-03-28 NOTE — Progress Notes (Signed)
Subjective:   Samantha Stevenson is a 67 y.o. female who presents for Medicare Annual (Subsequent) preventive examination.  Review of Systems:  No ROS.  Medicare Wellness Visit. Additional risk factors are reflected in the social history.  Cardiac Risk Factors include: advanced age (>59men, >39 women);dyslipidemia;hypertension Sleep patterns: has interrupted sleep, gets up 1-2 times nightly to void and sleeps 4-5 hours nightly.  Patient reports insomnia issues, discussed recommended sleep tips. Relevant patient education assigned to patient using Emmi.   Home Safety/Smoke Alarms: Feels safe in home. Smoke alarms in place.  Living environment; residence and Firearm Safety: 1-story house/ trailer, no firearms. Lives alone, no needs for DME, good support system Seat Belt Safety/Bike Helmet: Wears seat belt.     Objective:     Vitals: BP 116/62   Pulse 70   Resp 17   Ht 5\' 4"  (1.626 m)   Wt 168 lb (76.2 kg)   SpO2 98%   BMI 28.84 kg/m   Body mass index is 28.84 kg/m.  Advanced Directives 03/28/2018 03/21/2017 03/21/2017 01/10/2013 01/03/2013 01/03/2013 12/31/2012  Does Patient Have a Medical Advance Directive? No - No Patient does not have advance directive;Patient would not like information Patient does not have advance directive - Patient does not have advance directive;Patient would like information  Would patient like information on creating a medical advance directive? Yes (ED - Information included in AVS) (No Data) Yes (ED - Information included in AVS) - Advance directive packet given - Advance directive packet given  Pre-existing out of facility DNR order (yellow form or pink MOST form) - - - - - No -    Tobacco Social History   Tobacco Use  Smoking Status Never Smoker  Smokeless Tobacco Never Used     Counseling given: Not Answered   Past Medical History:  Diagnosis Date  . Anemia   . Arthritis   . Atrial fibrillation ([place])   . Degenerative arthritis of hip    s/p  L THR 12/2012  . Dyslipidemia   . Hyperlipidemia   . Hypertension   . Hypertension   . Migraines   . Myocardial infarction ([place]) 2000   due to atrial fib  . Pulmonary emboli ([place]) 12/2012 dx   post op (L THR), anticoag x 76mo   Past Surgical History:  Procedure Laterality Date  . Breast Ultrasound Left 04/16/13   Done @ breast center Impression: no malignancy appearance noted on the screen study is consistent with a summation shadow  . DG TUMB RIGHT HAND Left    cyst removal  . PARTIAL HYSTERECTOMY    . TONSILLECTOMY    . TOTAL HIP ARTHROPLASTY Left 01/03/2013   Procedure: TOTAL HIP ARTHROPLASTY ANTERIOR APPROACH;  Surgeon: Mcarthur Rossetti, MD;  Location: [facility];  Service: Orthopedics;  Laterality: Left;  Left Total Hip Arthroplasty, Anterior Approach  . VAGINA RECONSTRUCTION SURGERY     vagina had closed up when 67 years old   Family History  Problem Relation Age of Onset  . Heart disease Father   . Diabetes Mother   . Heart disease Mother        pacemaker  . Heart attack Mother 61  . Diabetes Sister   . Alcohol abuse Other   . Heart disease Other   . Hyperlipidemia Other   . Hypertension Other   . Diabetes Other   . Breast cancer Other    Social History   Socioeconomic History  . Marital status: Single    Spouse  name: Not on file  . Number of children: 2  . Years of education: Not on file  . Highest education level: Not on file  Occupational History  . Not on file  Social Needs  . Financial resource strain: Not very hard  . Food insecurity:    Worry: Sometimes true    Inability: Sometimes true  . Transportation needs:    Medical: No    Non-medical: No  Tobacco Use  . Smoking status: Never Smoker  . Smokeless tobacco: Never Used  Substance and Sexual Activity  . Alcohol use: No  . Drug use: No  . Sexual activity: Not Currently  Lifestyle  . Physical activity:    Days per week: 4 days    Minutes per session: 60 min  . Stress: To some extent    Relationships  . Social connections:    Talks on phone: More than three times a week    Gets together: More than three times a week    Attends religious service: More than 4 times per year    Active member of club or organization: Yes    Attends meetings of clubs or organizations: 1 to 4 times per year    Relationship status: Not on file  Other Topics Concern  . Not on file  Social History Narrative  . Not on file    Outpatient Encounter Medications as of 03/28/2018  Medication Sig  . acetaminophen (TYLENOL) 500 MG tablet Take 500 mg by mouth as needed.  Marland Kitchen DIGITEK 250 MCG tablet take 1 tablet by mouth every morning  . DILT-XR 240 MG 24 hr capsule TAKE 1 CAPSULE BY MOUTH EVERY MORNING  . ferrous sulfate 325 (65 FE) MG tablet Take 325 mg by mouth daily with breakfast.  . furosemide (LASIX) 20 MG tablet Take 20 mg by mouth daily.  . Multiple Vitamin (MULTIVITAMIN) tablet Take 1 tablet by mouth daily.  . rivaroxaban (XARELTO) 20 MG TABS tablet Take 1 tablet (20 mg total) by mouth daily with supper.  . rosuvastatin (CRESTOR) 20 MG tablet Take 1 tablet by mouth daily.   No facility-administered encounter medications on file as of 03/28/2018.     Activities of Daily Living In your present state of health, do you have any difficulty performing the following activities: 03/28/2018  Hearing? N  Vision? N  Difficulty concentrating or making decisions? N  Walking or climbing stairs? N  Dressing or bathing? N  Doing errands, shopping? N  Preparing Food and eating ? N  Using the Toilet? N  In the past six months, have you accidently leaked urine? N  Do you have problems with loss of bowel control? N  Managing your Medications? N  Managing your Finances? N  Housekeeping or managing your Housekeeping? N  Some recent data might be hidden    Patient Care Team: Hoyt Koch, MD as PCP - General (Internal Medicine) [name], MD as PCP - Cardiology (Cardiology) Mcarthur Rossetti, MD (Orthopedic Surgery) Tanda Rockers, MD (Pulmonary Disease) Inda Castle, MD (Inactive) (Gastroenterology)    Assessment:   This is a routine wellness examination for [place]. Physical assessment deferred to PCP.   Exercise Activities and Dietary recommendations Current Exercise Habits: Home exercise routine, Type of exercise: stretching;walking(stationary bike), Time (Minutes): 45, Frequency (Times/Week): 4, Weekly Exercise (Minutes/Week): 180, Intensity: Mild, Exercise limited by: orthopedic condition(s)  Diet (meal preparation, eat out, water intake, caffeinated beverages, dairy products, fruits and vegetables): in general, an "  unhealthy" diet, on average, 1-2 meals per day, reports poor appetite.  Reviewed heart healthy diet, encouraged patient to increase daily water intake. Patient states she cannot drink nutritional supplements, discussed eating small frequent meals and not skipping meals.   Goals    . Patient Stated     Increase my physical activity by going to the gym and workout. Eat small frequent meals and not skip meals. Enjoy life, family and worship God.        Fall Risk Fall Risk  03/28/2018 03/21/2017  Falls in the past year? No No    Depression Screen PHQ 2/9 Scores 03/28/2018 03/21/2017  PHQ - 2 Score 1 0  PHQ- 9 Score 2 -     Cognitive Function       Ad8 score reviewed for issues:  Issues making decisions: no  Less interest in hobbies / activities: no  Repeats questions, stories (family complaining): no  Trouble using ordinary gadgets (microwave, computer, phone):no  Forgets the month or year: no  Mismanaging finances: no  Remembering appts: no  Daily problems with thinking and/or memory: no Ad8 score is= 0  Immunization History  Administered Date(s) Administered  . Influenza Whole 07/23/2012  . Influenza,inj,Quad PF,6+ Mos 07/16/2013, 06/19/2016  . Pneumococcal Conjugate-13 01/03/2018  . Td 10/24/2011    Screening  Tests Health Maintenance  Topic Date Due  . HIV Screening  12/25/1965  . INFLUENZA VACCINE  05/23/2018  . PNA vac Low Risk Adult (2 of 2 - PPSV23) 01/04/2019  . MAMMOGRAM  07/29/2019  . TETANUS/TDAP  10/23/2021  . COLONOSCOPY  09/10/2022  . DEXA SCAN  Completed  . Hepatitis C Screening  Completed       Plan:     I have personally reviewed and noted the following in the patient's chart:   . Medical and social history . Use of alcohol, tobacco or illicit drugs  . Current medications and supplements . Functional ability and status . Nutritional status . Physical activity . Advanced directives . List of other physicians . Vitals . Screenings to include cognitive, depression, and falls . Referrals and appointments  In addition, I have reviewed and discussed with patient certain preventive protocols, quality metrics, and best practice recommendations. A written personalized care plan for preventive services as well as general preventive health recommendations were provided to patient.     Michiel Cowboy, RN  03/28/2018

## 2018-03-28 NOTE — Patient Instructions (Addendum)
www.[email or web address] or down load app on smart phone  Aunt Berenice Primas website lists multiple social resources for individuals such as: food, health, money, house hold goods, transit, medical supplies, job training and legal services.  [facility] in [place], [place] in: [place] Address: [address] [place], [place] [address] Phone: [phone]   Samantha Stevenson , Thank you for taking time to come for your Medicare Wellness Visit. I appreciate your ongoing commitment to your health goals. Please review the following plan we discussed and let me know if I can assist you in the future.   These are the goals we discussed: Goals    . Patient Stated     Increase my physical activity by going to the gym and workout. Eat small frequent meals and not skip meals. Enjoy life, family and worship God.        This is a list of the screening recommended for you and due dates:  Health Maintenance  Topic Date Due  . HIV Screening  [birth year]/25/1965  . Flu Shot  05/23/2018  . Pneumonia vaccines (2 of 2 - PPSV23) 01/04/2019  . Mammogram  07/29/2019  . Tetanus Vaccine  10/23/2021  . Colon Cancer Screening  09/10/2022  . DEXA scan (bone density measurement)  Completed  .  Hepatitis C: One time screening is recommended by Center for Disease Control  (CDC) for  adults born from [birth year] through 1963.   Completed     Health Maintenance, Female Adopting a healthy lifestyle and getting preventive care can go a long way to promote health and wellness. Talk with your health care provider about what schedule of regular examinations is right for you. This is a good chance for you to check in with your provider about disease prevention and staying healthy. In between checkups, there are plenty of things you can do on your own. Experts have done a lot of research about which lifestyle changes and preventive measures are most likely to keep you  healthy. Ask your health care provider for more information. Weight and diet Eat a healthy diet  Be sure to include plenty of vegetables, fruits, low-fat dairy products, and lean protein.  Do not eat a lot of foods high in solid fats, added sugars, or salt.  Get regular exercise. This is one of the most important things you can do for your health. ? Most adults should exercise for at least 150 minutes each week. The exercise should increase your heart rate and make you sweat (moderate-intensity exercise). ? Most adults should also do strengthening exercises at least twice a week. This is in addition to the moderate-intensity exercise.  Maintain a healthy weight  Body mass index (BMI) is a measurement that can be used to identify possible weight problems. It estimates body fat based on height and weight. Your health care provider can help determine your BMI and help you achieve or maintain a healthy weight.  For females 58 years of age and older: ? A BMI below 18.5 is considered underweight. ? A BMI of 18.5 to 24.9 is normal. ? A BMI of 25 to 29.9 is considered overweight. ? A BMI of 30 and above is considered obese.  Watch levels of cholesterol and blood lipids  You should start having your blood tested for lipids and cholesterol at 65 years of age, then have this test every 5 years.  You may need to have your cholesterol levels  checked more often if: ? Your lipid or cholesterol levels are high. ? You are older than 65 years of age. ? You are at high risk for heart disease.  Cancer screening Lung Cancer  Lung cancer screening is recommended for adults 83-51 years old who are at high risk for lung cancer because of a history of smoking.  A yearly low-dose CT scan of the lungs is recommended for people who: ? Currently smoke. ? Have quit within the past 15 years. ? Have at least a 30-pack-year history of smoking. A pack year is smoking an average of one pack of cigarettes a day  for 1 year.  Yearly screening should continue until it has been 15 years since you quit.  Yearly screening should stop if you develop a health problem that would prevent you from having lung cancer treatment.  Breast Cancer  Practice breast self-awareness. This means understanding how your breasts normally appear and feel.  It also means doing regular breast self-exams. Let your health care provider know about any changes, no matter how small.  If you are in your 20s or 30s, you should have a clinical breast exam (CBE) by a health care provider every 1-3 years as part of a regular health exam.  If you are 86 or older, have a CBE every year. Also consider having a breast X-ray (mammogram) every year.  If you have a family history of breast cancer, talk to your health care provider about genetic screening.  If you are at high risk for breast cancer, talk to your health care provider about having an MRI and a mammogram every year.  Breast cancer gene (BRCA) assessment is recommended for women who have family members with BRCA-related cancers. BRCA-related cancers include: ? Breast. ? Ovarian. ? Tubal. ? Peritoneal cancers.  Results of the assessment will determine the need for genetic counseling and BRCA1 and BRCA2 testing.  Cervical Cancer Your health care provider may recommend that you be screened regularly for cancer of the pelvic organs (ovaries, uterus, and vagina). This screening involves a pelvic examination, including checking for microscopic changes to the surface of your cervix (Pap test). You may be encouraged to have this screening done every 3 years, beginning at age 79.  For women ages 46-65, health care providers may recommend pelvic exams and Pap testing every 3 years, or they may recommend the Pap and pelvic exam, combined with testing for human papilloma virus (HPV), every 5 years. Some types of HPV increase your risk of cervical cancer. Testing for HPV may also be done  on women of any age with unclear Pap test results.  Other health care providers may not recommend any screening for nonpregnant women who are considered low risk for pelvic cancer and who do not have symptoms. Ask your health care provider if a screening pelvic exam is right for you.  If you have had past treatment for cervical cancer or a condition that could lead to cancer, you need Pap tests and screening for cancer for at least 20 years after your treatment. If Pap tests have been discontinued, your risk factors (such as having a new sexual partner) need to be reassessed to determine if screening should resume. Some women have medical problems that increase the chance of getting cervical cancer. In these cases, your health care provider may recommend more frequent screening and Pap tests.  Colorectal Cancer  This type of cancer can be detected and often prevented.  Routine colorectal cancer screening  usually begins at 65 years of age and continues through 65 years of age.  Your health care provider may recommend screening at an earlier age if you have risk factors for colon cancer.  Your health care provider may also recommend using home test kits to check for hidden blood in the stool.  A small camera at the end of a tube can be used to examine your colon directly (sigmoidoscopy or colonoscopy). This is done to check for the earliest forms of colorectal cancer.  Routine screening usually begins at age 61.  Direct examination of the colon should be repeated every 5-10 years through 65 years of age. However, you may need to be screened more often if early forms of precancerous polyps or small growths are found.  Skin Cancer  Check your skin from head to toe regularly.  Tell your health care provider about any new moles or changes in moles, especially if there is a change in a mole's shape or color.  Also tell your health care provider if you have a mole that is larger than the size of  a pencil eraser.  Always use sunscreen. Apply sunscreen liberally and repeatedly throughout the day.  Protect yourself by wearing long sleeves, pants, a wide-brimmed hat, and sunglasses whenever you are outside.  Heart disease, diabetes, and high blood pressure  High blood pressure causes heart disease and increases the risk of stroke. High blood pressure is more likely to develop in: ? People who have blood pressure in the high end of the normal range (130-139/85-89 mm Hg). ? People who are overweight or obese. ? People who are African American.  If you are 2-23 years of age, have your blood pressure checked every 3-5 years. If you are 77 years of age or older, have your blood pressure checked every year. You should have your blood pressure measured twice-once when you are at a hospital or clinic, and once when you are not at a hospital or clinic. Record the average of the two measurements. To check your blood pressure when you are not at a hospital or clinic, you can use: ? An automated blood pressure machine at a pharmacy. ? A home blood pressure monitor.  If you are between 23 years and 53 years old, ask your health care provider if you should take aspirin to prevent strokes.  Have regular diabetes screenings. This involves taking a blood sample to check your fasting blood sugar level. ? If you are at a normal weight and have a low risk for diabetes, have this test once every three years after 65 years of age. ? If you are overweight and have a high risk for diabetes, consider being tested at a younger age or more often. Preventing infection Hepatitis B  If you have a higher risk for hepatitis B, you should be screened for this virus. You are considered at high risk for hepatitis B if: ? You were born in a country where hepatitis B is common. Ask your health care provider which countries are considered high risk. ? Your parents were born in a high-risk country, and you have not been  immunized against hepatitis B (hepatitis B vaccine). ? You have HIV or AIDS. ? You use needles to inject street drugs. ? You live with someone who has hepatitis B. ? You have had sex with someone who has hepatitis B. ? You get hemodialysis treatment. ? You take certain medicines for conditions, including cancer, organ transplantation, and autoimmune conditions.  Hepatitis C  Blood testing is recommended for: ? Everyone born from [birth year] through 1963. ? Anyone with known risk factors for hepatitis C.  Sexually transmitted infections (STIs)  You should be screened for sexually transmitted infections (STIs) including gonorrhea and chlamydia if: ? You are sexually active and are younger than 65 years of age. ? You are older than 65 years of age and your health care provider tells you that you are at risk for this type of infection. ? Your sexual activity has changed since you were last screened and you are at an increased risk for chlamydia or gonorrhea. Ask your health care provider if you are at risk.  If you do not have HIV, but are at risk, it may be recommended that you take a prescription medicine daily to prevent HIV infection. This is called pre-exposure prophylaxis (PrEP). You are considered at risk if: ? You are sexually active and do not regularly use condoms or know the HIV status of your partner(s). ? You take drugs by injection. ? You are sexually active with a partner who has HIV.  Talk with your health care provider about whether you are at high risk of being infected with HIV. If you choose to begin PrEP, you should first be tested for HIV. You should then be tested every 3 months for as long as you are taking PrEP. Pregnancy  If you are premenopausal and you may become pregnant, ask your health care provider about preconception counseling.  If you may become pregnant, take 400 to 800 micrograms (mcg) of folic acid every day.  If you want to prevent pregnancy, talk to your  health care provider about birth control (contraception). Osteoporosis and menopause  Osteoporosis is a disease in which the bones lose minerals and strength with aging. This can result in serious bone fractures. Your risk for osteoporosis can be identified using a bone density scan.  If you are 72 years of age or older, or if you are at risk for osteoporosis and fractures, ask your health care provider if you should be screened.  Ask your health care provider whether you should take a calcium or vitamin D supplement to lower your risk for osteoporosis.  Menopause may have certain physical symptoms and risks.  Hormone replacement therapy may reduce some of these symptoms and risks. Talk to your health care provider about whether hormone replacement therapy is right for you. Follow these instructions at home:  Schedule regular health, dental, and eye exams.  Stay current with your immunizations.  Do not use any tobacco products including cigarettes, chewing tobacco, or electronic cigarettes.  If you are pregnant, do not drink alcohol.  If you are breastfeeding, limit how much and how often you drink alcohol.  Limit alcohol intake to no more than 1 drink per day for nonpregnant women. One drink equals 12 ounces of beer, 5 ounces of [place], or 1 ounces of hard liquor.  Do not use street drugs.  Do not share needles.  Ask your health care provider for help if you need support or information about quitting drugs.  Tell your health care provider if you often feel depressed.  Tell your health care provider if you have ever been abused or do not feel safe at home. This information is not intended to replace advice given to you by your health care provider. Make sure you discuss any questions you have with your health care provider. Document Released: 04/24/2011 Document Revised: 03/16/2016 Document Reviewed: 07/13/2015  Chartered certified accountant Patient Education  [employer].

## 2018-03-29 ENCOUNTER — Ambulatory Visit (INDEPENDENT_AMBULATORY_CARE_PROVIDER_SITE_OTHER): Payer: Medicare Other | Admitting: Family

## 2018-03-29 ENCOUNTER — Encounter: Payer: Self-pay | Admitting: Family

## 2018-03-29 VITALS — BP 118/78 | HR 64 | Temp 98.1°F | Ht 64.0 in | Wt 169.0 lb

## 2018-03-29 DIAGNOSIS — R42 Dizziness and giddiness: Secondary | ICD-10-CM | POA: Diagnosis not present

## 2018-03-29 DIAGNOSIS — H6592 Unspecified nonsuppurative otitis media, left ear: Secondary | ICD-10-CM

## 2018-03-29 MED ORDER — FLUTICASONE PROPIONATE 50 MCG/ACT NA SUSP: 2.0000 | Freq: Every day | NASAL | 6 refills | Status: DC

## 2018-03-29 MED ORDER — DOXYCYCLINE HYCLATE 100 MG PO TABS: 100.0000 mg | ORAL_TABLET | Freq: Two times a day (BID) | ORAL | 0 refills | Status: DC

## 2018-03-29 NOTE — Progress Notes (Signed)
Samantha Stevenson is a 67 y.o. female with the following history as recorded in EpicCare:  Patient Active Problem List   Diagnosis Date Noted  . Preoperative clearance 02/27/2018  . Bilateral leg edema 02/27/2018  . Dyspnea 06/19/2016  . Routine general medical examination at a health care facility 06/24/2015  . Constipation 03/17/2015  . Myalgia and myositis 03/17/2015  . Dyslipidemia   . Atrial fibrillation ([place])   . Hypertension   . History of pulmonary embolism 01/10/2013  . Anemia 01/10/2013  . Arthritis 01/03/2013    Current Outpatient Medications  Medication Sig Dispense Refill  . acetaminophen (TYLENOL) 500 MG tablet Take 500 mg by mouth as needed.    Marland Kitchen DIGITEK 250 MCG tablet take 1 tablet by mouth every morning 90 tablet 3  . DILT-XR 240 MG 24 hr capsule TAKE 1 CAPSULE BY MOUTH EVERY MORNING 90 capsule 3  . ferrous sulfate 325 (65 FE) MG tablet Take 325 mg by mouth daily with breakfast.    . furosemide (LASIX) 20 MG tablet Take 20 mg by mouth daily.    . Multiple Vitamin (MULTIVITAMIN) tablet Take 1 tablet by mouth daily.    . rivaroxaban (XARELTO) 20 MG TABS tablet Take 1 tablet (20 mg total) by mouth daily with supper. 30 tablet 6  . rosuvastatin (CRESTOR) 20 MG tablet Take 1 tablet by mouth daily.    Marland Kitchen doxycycline (VIBRA-TABS) 100 MG tablet Take 1 tablet (100 mg total) by mouth 2 (two) times daily. 20 tablet 0  . fluticasone (FLONASE) 50 MCG/ACT nasal spray Place 2 sprays into both nostrils daily. 16 g 6   No current facility-administered medications for this visit.     Allergies: Latex; Penicillins; Asa [aspirin]; Celebrex [celecoxib]; and Nsaids  Past Medical History:  Diagnosis Date  . Anemia   . Arthritis   . Atrial fibrillation ([place])   . Degenerative arthritis of hip    s/p L THR 12/2012  . Dyslipidemia   . Hyperlipidemia   . Hypertension   . Hypertension   . Migraines   . Myocardial infarction ([place]) 2000   due to atrial fib  . Pulmonary emboli ([place])  12/2012 dx   post op (L THR), anticoag x 69mo    Past Surgical History:  Procedure Laterality Date  . Breast Ultrasound Left 04/16/13   Done @ breast center Impression: no malignancy appearance noted on the screen study is consistent with a summation shadow  . DG TUMB RIGHT HAND Left    cyst removal  . PARTIAL HYSTERECTOMY    . TONSILLECTOMY    . TOTAL HIP ARTHROPLASTY Left 01/03/2013   Procedure: TOTAL HIP ARTHROPLASTY ANTERIOR APPROACH;  Surgeon: Mcarthur Rossetti, MD;  Location: [facility];  Service: Orthopedics;  Laterality: Left;  Left Total Hip Arthroplasty, Anterior Approach  . VAGINA RECONSTRUCTION SURGERY     vagina had closed up when 67 years old    Family History  Problem Relation Age of Onset  . Heart disease Father   . Diabetes Mother   . Heart disease Mother        pacemaker  . Heart attack Mother 64  . Diabetes Sister   . Alcohol abuse Other   . Heart disease Other   . Hyperlipidemia Other   . Hypertension Other   . Diabetes Other   . Breast cancer Other     Social History   Tobacco Use  . Smoking status: Never Smoker  . Smokeless tobacco: Never Used  Substance Use  Topics  . Alcohol use: No    Subjective:  Patient went to the ER at the end of April with bilateral ear pain; was treated for ear infections with antibiotics; feels that right ear cleared completely; left ear continues to have sensation of "popping"; was discharged on Zyrtec and Cortisporin; did not fill the Rx for Nasacort; did start to have vertigo in the past week; no diminished hearing; no blurred vision;   Objective:  Vitals:   03/29/18 0815  BP: 118/78  Pulse: 64  Temp: 98.1 F (36.7 C)  TempSrc: Oral  SpO2: 97%  Weight: 169 lb (76.7 kg)  Height: 5\' 4"  (1.626 m)    General: Well developed, well nourished, in no acute distress  Skin : Warm and dry.  Head: Normocephalic and atraumatic  Eyes: Sclera and conjunctiva clear; pupils round and reactive to light; extraocular movements  intact  Ears: External normal; canals clear; left tympanic membrane erythematous/ ? Scar tissue or chole Oropharynx: Pink, supple. No suspicious lesions  Neck: Supple without thyromegaly, adenopathy  Lungs: Respirations unlabored; clear to auscultation bilaterally without wheeze, rales, rhonchi  CVS exam: normal rate and regular rhythm.  Neurologic: Alert and oriented; speech intact; face symmetrical; moves all extremities well; CNII-XII intact without focal deficit  Assessment:  1. Vertigo   2. Left serous otitis media, unspecified chronicity     Plan:  Will re-treat for infection with Doxycycline and Flonase; ? Cholesteatoma; refer to ENT for further evaluation.    No follow-ups on file.  Orders Placed This Encounter  Procedures  . Ambulatory referral to ENT    Referral Priority:   Routine    Referral Type:   Consultation    Referral Reason:   Specialty Services Required    Referred to Provider:   Thornell Sartorius, MD    Requested Specialty:   Otolaryngology    Number of Visits Requested:   1    Requested Prescriptions   Signed Prescriptions Disp Refills  . fluticasone (FLONASE) 50 MCG/ACT nasal spray 16 g 6    Sig: Place 2 sprays into both nostrils daily.  Marland Kitchen doxycycline (VIBRA-TABS) 100 MG tablet 20 tablet 0    Sig: Take 1 tablet (100 mg total) by mouth 2 (two) times daily.

## 2018-03-31 ENCOUNTER — Other Ambulatory Visit: Payer: Self-pay | Admitting: Internal Medicine

## 2018-04-04 ENCOUNTER — Ambulatory Visit: Payer: Medicare Other | Admitting: Internal Medicine

## 2018-04-04 NOTE — Progress Notes (Signed)
Samantha Stevenson - 67 y.o. female MRN [id number]  Date of birth: 08-31-1951  SUBJECTIVE:  Including CC & ROS.  Chief Complaint  Patient presents with  . Ear Pain    Samantha Stevenson is a 67 y.o. female that is presenting with ear pain. She was seen on 03/29/18 for same symptoms. Admits to no improvement in her ear pain. She is currently taking doxycycline. She feels her vertigo has not improved. Denies vision difficulties. Admits to headaches.     Review of Systems  Constitutional: Negative for fever.  HENT: Positive for ear pain. Negative for ear discharge.     HISTORY: Past Medical, Surgical, Social, and Family History Reviewed & Updated per EMR.   Pertinent Historical Findings include:  Past Medical History:  Diagnosis Date  . Anemia   . Arthritis   . Atrial fibrillation ([place])   . Degenerative arthritis of hip    s/p L THR 12/2012  . Dyslipidemia   . Hyperlipidemia   . Hypertension   . Hypertension   . Migraines   . Myocardial infarction ([place]) 2000   due to atrial fib  . Pulmonary emboli ([place]) 12/2012 dx   post op (L THR), anticoag x 15mo    Past Surgical History:  Procedure Laterality Date  . Breast Ultrasound Left 04/16/13   Done @ breast center Impression: no malignancy appearance noted on the screen study is consistent with a summation shadow  . DG TUMB RIGHT HAND Left    cyst removal  . PARTIAL HYSTERECTOMY    . TONSILLECTOMY    . TOTAL HIP ARTHROPLASTY Left 01/03/2013   Procedure: TOTAL HIP ARTHROPLASTY ANTERIOR APPROACH;  Surgeon: Mcarthur Rossetti, MD;  Location: [facility];  Service: Orthopedics;  Laterality: Left;  Left Total Hip Arthroplasty, Anterior Approach  . VAGINA RECONSTRUCTION SURGERY     vagina had closed up when 67 years old    Allergies  Allergen Reactions  . Latex Hives and Rash  . Penicillins Other (See Comments)    Severe swelling  . Asa [Aspirin] Other (See Comments)    Makes stomach upset  . Celebrex [Celecoxib]     GI upset  .  Nsaids     rash    Family History  Problem Relation Age of Onset  . Heart disease Father   . Diabetes Mother   . Heart disease Mother        pacemaker  . Heart attack Mother 60  . Diabetes Sister   . Alcohol abuse Other   . Heart disease Other   . Hyperlipidemia Other   . Hypertension Other   . Diabetes Other   . Breast cancer Other      Social History   Socioeconomic History  . Marital status: Single    Spouse name: Not on file  . Number of children: 2  . Years of education: Not on file  . Highest education level: Not on file  Occupational History  . Not on file  Social Needs  . Financial resource strain: Not very hard  . Food insecurity:    Worry: Sometimes true    Inability: Sometimes true  . Transportation needs:    Medical: No    Non-medical: No  Tobacco Use  . Smoking status: Never Smoker  . Smokeless tobacco: Never Used  Substance and Sexual Activity  . Alcohol use: No  . Drug use: No  . Sexual activity: Not Currently  Lifestyle  . Physical activity:    Days per week:  4 days    Minutes per session: 60 min  . Stress: To some extent  Relationships  . Social connections:    Talks on phone: More than three times a week    Gets together: More than three times a week    Attends religious service: More than 4 times per year    Active member of club or organization: Yes    Attends meetings of clubs or organizations: 1 to 4 times per year    Relationship status: Not on file  . Intimate partner violence:    Fear of current or ex partner: Not on file    Emotionally abused: Not on file    Physically abused: Not on file    Forced sexual activity: Not on file  Other Topics Concern  . Not on file  Social History Narrative  . Not on file     PHYSICAL EXAM:  VS: BP (!) 142/86 (BP Location: Left Arm, Patient Position: Sitting, Cuff Size: Normal)   Pulse 76   Temp 98.4 F (36.9 C) (Oral)   Ht 5\' 4"  (1.626 m)   Wt 170 lb (77.1 kg)   SpO2 97%   BMI  29.18 kg/m  Physical Exam Gen: NAD, alert, cooperative with exam, well-appearing ENT: normal lips, normal nasal mucosa, tympanic membranes clear and intact bilaterally, normal oropharynx, Eye: normal EOM, normal conjunctiva and lids CV:  no edema, +2 pedal pulses, Resp: no accessory muscle use, non-labored,   Skin: no rashes, no areas of induration  Neuro: normal tone, normal sensation to touch Psych:  normal insight, alert and oriented MSK: Normal gait, normal strength       ASSESSMENT & PLAN:   Dysfunction of left eustachian tube Ear does not appear infected. Likely dysfunction  - flonase 2 puffs twice daily  - if no improvement may need referral to ENT

## 2018-04-05 ENCOUNTER — Telehealth: Payer: Self-pay | Admitting: Internal Medicine

## 2018-04-05 ENCOUNTER — Encounter: Payer: Self-pay | Admitting: Family Medicine

## 2018-04-05 ENCOUNTER — Ambulatory Visit (INDEPENDENT_AMBULATORY_CARE_PROVIDER_SITE_OTHER): Payer: Medicare Other | Admitting: Family Medicine

## 2018-04-05 VITALS — BP 142/86 | HR 76 | Temp 98.4°F | Ht 64.0 in | Wt 170.0 lb

## 2018-04-05 DIAGNOSIS — H6982 Other specified disorders of Eustachian tube, left ear: Secondary | ICD-10-CM | POA: Insufficient documentation

## 2018-04-05 NOTE — Telephone Encounter (Signed)
[facility] reports they did not receive refill sent 03/06/18. Verbal refill given.

## 2018-04-05 NOTE — Assessment & Plan Note (Signed)
Ear does not appear infected. Likely dysfunction  - flonase 2 puffs twice daily  - if no improvement may need referral to ENT

## 2018-04-05 NOTE — Patient Instructions (Signed)
Please try the flonase two times daily  Please let us know if there is no improvement. We may need to try and send you to the ear doctor.

## 2018-04-05 NOTE — Telephone Encounter (Signed)
Copied from [place] [id number]. Topic: Quick Communication - Rx Refill/Question >> Apr 05, 2018 10:55 AM Synthia Innocent wrote: Medication: DILT-XR 240 MG 24 hr capsule 90 day supply  Has the patient contacted their pharmacy? Yes. They sent request 3 days ago  (Agent: If no, request that the patient contact the pharmacy for the refill.) (Agent: If yes, when and what did the pharmacy advise?)  Preferred Pharmacy (with phone number or street name): [facility] [address]  Agent: Please be advised that RX refills may take up to 3 business days. We ask that you follow-up with your pharmacy.

## 2018-04-18 DIAGNOSIS — H6063 Unspecified chronic otitis externa, bilateral: Secondary | ICD-10-CM | POA: Diagnosis not present

## 2018-04-18 DIAGNOSIS — H6121 Impacted cerumen, right ear: Secondary | ICD-10-CM | POA: Diagnosis not present

## 2018-05-23 DIAGNOSIS — Z9289 Personal history of other medical treatment: Secondary | ICD-10-CM

## 2018-05-23 HISTORY — DX: Personal history of other medical treatment: Z92.89

## 2018-05-28 ENCOUNTER — Inpatient Hospital Stay (HOSPITAL_COMMUNITY)
Admission: EM | Admit: 2018-05-28 | Discharge: 2018-06-23 | DRG: 219 | Disposition: A | Discharge status: Home / Self Care | Payer: Medicare Other | Attending: Vascular Surgery | Admitting: Vascular Surgery

## 2018-05-28 ENCOUNTER — Emergency Department (HOSPITAL_COMMUNITY): Payer: Medicare Other

## 2018-05-28 ENCOUNTER — Encounter (HOSPITAL_COMMUNITY): Payer: Self-pay

## 2018-05-28 ENCOUNTER — Other Ambulatory Visit (HOSPITAL_COMMUNITY): Payer: Medicare Other

## 2018-05-28 ENCOUNTER — Other Ambulatory Visit: Payer: Self-pay

## 2018-05-28 DIAGNOSIS — E785 Hyperlipidemia, unspecified: Secondary | ICD-10-CM | POA: Diagnosis present

## 2018-05-28 DIAGNOSIS — I71019 Dissection of thoracic aorta, unspecified: Secondary | ICD-10-CM | POA: Diagnosis present

## 2018-05-28 DIAGNOSIS — R000 Tachycardia, unspecified: Secondary | ICD-10-CM | POA: Diagnosis not present

## 2018-05-28 DIAGNOSIS — Z880 Allergy status to penicillin: Secondary | ICD-10-CM

## 2018-05-28 DIAGNOSIS — J90 Pleural effusion, not elsewhere classified: Secondary | ICD-10-CM | POA: Diagnosis not present

## 2018-05-28 DIAGNOSIS — Z7901 Long term (current) use of anticoagulants: Secondary | ICD-10-CM | POA: Diagnosis not present

## 2018-05-28 DIAGNOSIS — I480 Paroxysmal atrial fibrillation: Secondary | ICD-10-CM | POA: Diagnosis present

## 2018-05-28 DIAGNOSIS — I714 Abdominal aortic aneurysm, without rupture: Secondary | ICD-10-CM | POA: Diagnosis not present

## 2018-05-28 DIAGNOSIS — R112 Nausea with vomiting, unspecified: Secondary | ICD-10-CM | POA: Diagnosis present

## 2018-05-28 DIAGNOSIS — I7100 Dissection of unspecified site of aorta: Secondary | ICD-10-CM | POA: Diagnosis present

## 2018-05-28 DIAGNOSIS — Z86711 Personal history of pulmonary embolism: Secondary | ICD-10-CM | POA: Diagnosis not present

## 2018-05-28 DIAGNOSIS — I97621 Postprocedural hematoma of a circulatory system organ or structure following other procedure: Secondary | ICD-10-CM | POA: Diagnosis not present

## 2018-05-28 DIAGNOSIS — Z8249 Family history of ischemic heart disease and other diseases of the circulatory system: Secondary | ICD-10-CM

## 2018-05-28 DIAGNOSIS — S27892A Contusion of other specified intrathoracic organs, initial encounter: Secondary | ICD-10-CM | POA: Diagnosis present

## 2018-05-28 DIAGNOSIS — I252 Old myocardial infarction: Secondary | ICD-10-CM

## 2018-05-28 DIAGNOSIS — Z79899 Other long term (current) drug therapy: Secondary | ICD-10-CM

## 2018-05-28 DIAGNOSIS — R079 Chest pain, unspecified: Secondary | ICD-10-CM | POA: Diagnosis not present

## 2018-05-28 DIAGNOSIS — I700 Atherosclerosis of aorta: Secondary | ICD-10-CM | POA: Diagnosis not present

## 2018-05-28 DIAGNOSIS — Z91040 Latex allergy status: Secondary | ICD-10-CM

## 2018-05-28 DIAGNOSIS — R899 Unspecified abnormal finding in specimens from other organs, systems and tissues: Secondary | ICD-10-CM | POA: Diagnosis not present

## 2018-05-28 DIAGNOSIS — D62 Acute posthemorrhagic anemia: Secondary | ICD-10-CM | POA: Diagnosis not present

## 2018-05-28 DIAGNOSIS — I712 Thoracic aortic aneurysm, without rupture: Secondary | ICD-10-CM | POA: Diagnosis not present

## 2018-05-28 DIAGNOSIS — K8689 Other specified diseases of pancreas: Secondary | ICD-10-CM | POA: Diagnosis present

## 2018-05-28 DIAGNOSIS — I898 Other specified noninfective disorders of lymphatic vessels and lymph nodes: Secondary | ICD-10-CM | POA: Diagnosis not present

## 2018-05-28 DIAGNOSIS — R609 Edema, unspecified: Secondary | ICD-10-CM | POA: Diagnosis not present

## 2018-05-28 DIAGNOSIS — K5903 Drug induced constipation: Secondary | ICD-10-CM | POA: Diagnosis present

## 2018-05-28 DIAGNOSIS — J181 Lobar pneumonia, unspecified organism: Secondary | ICD-10-CM | POA: Diagnosis not present

## 2018-05-28 DIAGNOSIS — R0682 Tachypnea, not elsewhere classified: Secondary | ICD-10-CM

## 2018-05-28 DIAGNOSIS — Z90710 Acquired absence of both cervix and uterus: Secondary | ICD-10-CM

## 2018-05-28 DIAGNOSIS — R319 Hematuria, unspecified: Secondary | ICD-10-CM | POA: Diagnosis present

## 2018-05-28 DIAGNOSIS — J942 Hemothorax: Secondary | ICD-10-CM

## 2018-05-28 DIAGNOSIS — Z006 Encounter for examination for normal comparison and control in clinical research program: Secondary | ICD-10-CM | POA: Diagnosis not present

## 2018-05-28 DIAGNOSIS — I7101 Dissection of thoracic aorta: Secondary | ICD-10-CM | POA: Diagnosis present

## 2018-05-28 DIAGNOSIS — Z888 Allergy status to other drugs, medicaments and biological substances status: Secondary | ICD-10-CM

## 2018-05-28 DIAGNOSIS — Z7951 Long term (current) use of inhaled steroids: Secondary | ICD-10-CM | POA: Diagnosis not present

## 2018-05-28 DIAGNOSIS — I6522 Occlusion and stenosis of left carotid artery: Secondary | ICD-10-CM | POA: Diagnosis not present

## 2018-05-28 DIAGNOSIS — J939 Pneumothorax, unspecified: Secondary | ICD-10-CM

## 2018-05-28 DIAGNOSIS — J9811 Atelectasis: Secondary | ICD-10-CM | POA: Diagnosis not present

## 2018-05-28 DIAGNOSIS — Z96642 Presence of left artificial hip joint: Secondary | ICD-10-CM | POA: Diagnosis present

## 2018-05-28 DIAGNOSIS — K567 Ileus, unspecified: Secondary | ICD-10-CM

## 2018-05-28 DIAGNOSIS — R001 Bradycardia, unspecified: Secondary | ICD-10-CM | POA: Diagnosis not present

## 2018-05-28 DIAGNOSIS — I361 Nonrheumatic tricuspid (valve) insufficiency: Secondary | ICD-10-CM

## 2018-05-28 DIAGNOSIS — I10 Essential (primary) hypertension: Secondary | ICD-10-CM | POA: Diagnosis present

## 2018-05-28 DIAGNOSIS — Z4682 Encounter for fitting and adjustment of non-vascular catheter: Secondary | ICD-10-CM | POA: Diagnosis not present

## 2018-05-28 DIAGNOSIS — I7771 Dissection of carotid artery: Secondary | ICD-10-CM | POA: Diagnosis present

## 2018-05-28 DIAGNOSIS — Z886 Allergy status to analgesic agent status: Secondary | ICD-10-CM

## 2018-05-28 DIAGNOSIS — I959 Hypotension, unspecified: Secondary | ICD-10-CM | POA: Diagnosis not present

## 2018-05-28 DIAGNOSIS — R0989 Other specified symptoms and signs involving the circulatory and respiratory systems: Secondary | ICD-10-CM | POA: Diagnosis not present

## 2018-05-28 DIAGNOSIS — Z9689 Presence of other specified functional implants: Secondary | ICD-10-CM

## 2018-05-28 DIAGNOSIS — R1010 Upper abdominal pain, unspecified: Secondary | ICD-10-CM | POA: Diagnosis not present

## 2018-05-28 DIAGNOSIS — Z9889 Other specified postprocedural states: Secondary | ICD-10-CM

## 2018-05-28 DIAGNOSIS — Z01810 Encounter for preprocedural cardiovascular examination: Secondary | ICD-10-CM | POA: Diagnosis not present

## 2018-05-28 DIAGNOSIS — R072 Precordial pain: Secondary | ICD-10-CM | POA: Diagnosis not present

## 2018-05-28 DIAGNOSIS — I4891 Unspecified atrial fibrillation: Secondary | ICD-10-CM | POA: Diagnosis not present

## 2018-05-28 DIAGNOSIS — R1013 Epigastric pain: Secondary | ICD-10-CM | POA: Diagnosis not present

## 2018-05-28 LAB — CBC WITH DIFFERENTIAL/PLATELET
Basophils Absolute: 0 10*3/uL (ref 0.0–0.1)
Basophils Relative: 0 %
Eosinophils Absolute: 0 10*3/uL (ref 0.0–0.7)
Eosinophils Relative: 0 %
HCT: 34.3 % — ABNORMAL LOW (ref 36.0–46.0)
Hemoglobin: 10.8 g/dL — ABNORMAL LOW (ref 12.0–15.0)
Lymphocytes Relative: 12 %
Lymphs Abs: 1.2 10*3/uL (ref 0.7–4.0)
MCH: 27.3 pg (ref 26.0–34.0)
MCHC: 31.5 g/dL (ref 30.0–36.0)
MCV: 86.8 fL (ref 78.0–100.0)
Monocytes Absolute: 0.5 10*3/uL (ref 0.1–1.0)
Monocytes Relative: 4 %
Neutro Abs: 8.5 10*3/uL — ABNORMAL HIGH (ref 1.7–7.7)
Neutrophils Relative %: 84 %
Platelets: 333 10*3/uL (ref 150–400)
RBC: 3.95 MIL/uL (ref 3.87–5.11)
RDW: 16 % — ABNORMAL HIGH (ref 11.5–15.5)
WBC: 10.2 10*3/uL (ref 4.0–10.5)

## 2018-05-28 LAB — I-STAT CHEM 8, ED
BUN: 17 mg/dL (ref 8–23)
Calcium, Ion: 1.14 mmol/L — ABNORMAL LOW (ref 1.15–1.40)
Chloride: 104 mmol/L (ref 98–111)
Creatinine, Ser: 0.8 mg/dL (ref 0.44–1.00)
Glucose, Bld: 130 mg/dL — ABNORMAL HIGH (ref 70–99)
HCT: 34 % — ABNORMAL LOW (ref 36.0–46.0)
Hemoglobin: 11.6 g/dL — ABNORMAL LOW (ref 12.0–15.0)
Potassium: 3.9 mmol/L (ref 3.5–5.1)
Sodium: 142 mmol/L (ref 135–145)
TCO2: 29 mmol/L (ref 22–32)

## 2018-05-28 LAB — I-STAT TROPONIN, ED: Troponin i, poc: 0 ng/mL (ref 0.00–0.08)

## 2018-05-28 LAB — MRSA PCR SCREENING: MRSA by PCR: NEGATIVE

## 2018-05-28 LAB — COMPREHENSIVE METABOLIC PANEL
ALT: 12 U/L (ref 0–44)
AST: 23 U/L (ref 15–41)
Albumin: 3.7 g/dL (ref 3.5–5.0)
Alkaline Phosphatase: 94 U/L (ref 38–126)
Anion gap: 11 (ref 5–15)
BUN: 16 mg/dL (ref 8–23)
CO2: 26 mmol/L (ref 22–32)
Calcium: 8.9 mg/dL (ref 8.9–10.3)
Chloride: 105 mmol/L (ref 98–111)
Creatinine, Ser: 0.83 mg/dL (ref 0.44–1.00)
GFR calc Af Amer: >60 mL/min (ref >60)
GFR calc non Af Amer: >60 mL/min (ref >60)
Glucose, Bld: 134 mg/dL — ABNORMAL HIGH (ref 70–99)
Potassium: 4 mmol/L (ref 3.5–5.1)
Sodium: 142 mmol/L (ref 135–145)
Total Bilirubin: 0.5 mg/dL (ref 0.3–1.2)
Total Protein: 7.9 g/dL (ref 6.5–8.1)

## 2018-05-28 LAB — ECHOCARDIOGRAM COMPLETE
Height: 64 in
Weight: 2684.32 oz

## 2018-05-28 LAB — LIPASE, BLOOD: Lipase: 36 U/L (ref 11–51)

## 2018-05-28 MED ORDER — MORPHINE SULFATE (PF) 4 MG/ML IV SOLN
4.0000 mg | Freq: Once | INTRAVENOUS | Status: AC
  Administered 2018-05-28: 4 mg via INTRAVENOUS
  Filled 2018-05-28: qty 1

## 2018-05-28 MED ORDER — SODIUM CHLORIDE 0.9% FLUSH
3.0000 mL | Freq: Two times a day (BID) | INTRAVENOUS | Status: DC
  Administered 2018-05-28 – 2018-06-13 (×31): 3 mL via INTRAVENOUS

## 2018-05-28 MED ORDER — IOPAMIDOL (ISOVUE-370) INJECTION 76%
100.0000 mL | Freq: Once | INTRAVENOUS | Status: AC | PRN
  Administered 2018-05-28: 100 mL via INTRAVENOUS

## 2018-05-28 MED ORDER — ESMOLOL HCL-SODIUM CHLORIDE 2000 MG/100ML IV SOLN
25.0000 ug/kg/min | Freq: Once | INTRAVENOUS | Status: AC
  Administered 2018-05-28: 25 ug/kg/min via INTRAVENOUS
  Filled 2018-05-28 (×2): qty 100

## 2018-05-28 MED ORDER — SENNOSIDES-DOCUSATE SODIUM 8.6-50 MG PO TABS
1.0000 | ORAL_TABLET | Freq: Every evening | ORAL | Status: DC | PRN
  Administered 2018-06-01: 1 via ORAL
  Filled 2018-05-28: qty 1

## 2018-05-28 MED ORDER — HYDROCODONE-ACETAMINOPHEN 7.5-325 MG/15ML PO SOLN
15.0000 mL | ORAL | Status: DC | PRN
  Filled 2018-05-28: qty 15

## 2018-05-28 MED ORDER — ESMOLOL HCL-SODIUM CHLORIDE 2000 MG/100ML IV SOLN
25.0000 ug/kg/min | INTRAVENOUS | Status: DC
  Administered 2018-05-28: 30 ug/kg/min via INTRAVENOUS
  Administered 2018-05-28: 50 ug/kg/min via INTRAVENOUS
  Filled 2018-05-28 (×2): qty 100

## 2018-05-28 MED ORDER — ESMOLOL HCL-SODIUM CHLORIDE 2000 MG/100ML IV SOLN: 25.0000 ug/kg/min | Freq: Once | INTRAVENOUS | Status: DC

## 2018-05-28 MED ORDER — FUROSEMIDE 20 MG PO TABS
20.0000 mg | ORAL_TABLET | Freq: Every day | ORAL | Status: DC
  Administered 2018-05-28 – 2018-06-01 (×5): 20 mg via ORAL
  Filled 2018-05-28 (×5): qty 1

## 2018-05-28 MED ORDER — MORPHINE SULFATE (PF) 4 MG/ML IV SOLN: 4.0000 mg | INTRAVENOUS | Status: DC | PRN

## 2018-05-28 MED ORDER — ONDANSETRON HCL 4 MG/2ML IJ SOLN
4.0000 mg | Freq: Once | INTRAMUSCULAR | Status: AC
  Administered 2018-05-28: 4 mg via INTRAVENOUS
  Filled 2018-05-28: qty 2

## 2018-05-28 MED ORDER — FENTANYL 50 MCG/HR TD PT72: 50.0000 ug | MEDICATED_PATCH | TRANSDERMAL | Status: DC

## 2018-05-28 MED ORDER — MORPHINE SULFATE (PF) 4 MG/ML IV SOLN
4.0000 mg | Freq: Once | INTRAVENOUS | Status: AC
  Administered 2018-05-28: 4 mg via INTRAVENOUS

## 2018-05-28 MED ORDER — HYDROMORPHONE HCL 1 MG/ML IJ SOLN
1.0000 mg | INTRAMUSCULAR | Status: DC | PRN
  Administered 2018-05-28 – 2018-05-29 (×5): 1 mg via INTRAVENOUS
  Filled 2018-05-28 (×5): qty 1

## 2018-05-28 MED ORDER — FENTANYL CITRATE (PF) 100 MCG/2ML IJ SOLN: 50.0000 ug | INTRAMUSCULAR | Status: DC | PRN

## 2018-05-28 MED ORDER — BISACODYL 5 MG PO TBEC
5.0000 mg | DELAYED_RELEASE_TABLET | Freq: Every day | ORAL | Status: DC | PRN
  Administered 2018-06-01 – 2018-06-06 (×5): 5 mg via ORAL
  Filled 2018-05-28 (×5): qty 1

## 2018-05-28 MED ORDER — SODIUM CHLORIDE 0.9 % IV BOLUS
1000.0000 mL | Freq: Once | INTRAVENOUS | Status: AC
  Administered 2018-05-28: 1000 mL via INTRAVENOUS

## 2018-05-28 MED ORDER — DILTIAZEM HCL ER COATED BEADS 240 MG PO CP24
240.0000 mg | ORAL_CAPSULE | Freq: Every day | ORAL | Status: DC
  Administered 2018-05-28 – 2018-06-13 (×17): 240 mg via ORAL
  Filled 2018-05-28: qty 1
  Filled 2018-05-28: qty 2
  Filled 2018-05-28 (×4): qty 1
  Filled 2018-05-28: qty 2
  Filled 2018-05-28 (×3): qty 1
  Filled 2018-05-28: qty 2
  Filled 2018-05-28 (×2): qty 1
  Filled 2018-05-28: qty 2
  Filled 2018-05-28 (×5): qty 1

## 2018-05-28 MED ORDER — ROSUVASTATIN CALCIUM 20 MG PO TABS
20.0000 mg | ORAL_TABLET | Freq: Every day | ORAL | Status: DC
  Administered 2018-05-28 – 2018-06-12 (×16): 20 mg via ORAL
  Filled 2018-05-28 (×16): qty 1

## 2018-05-28 MED ORDER — HYDROMORPHONE HCL 1 MG/ML IJ SOLN
1.0000 mg | Freq: Once | INTRAMUSCULAR | Status: AC
  Administered 2018-05-28: 1 mg via INTRAVENOUS
  Filled 2018-05-28: qty 1

## 2018-05-28 MED ORDER — DEXTROSE-NACL 5-0.45 % IV SOLN
INTRAVENOUS | Status: DC
  Administered 2018-05-28 (×2): via INTRAVENOUS

## 2018-05-28 MED ORDER — MORPHINE SULFATE (PF) 4 MG/ML IV SOLN
INTRAVENOUS | Status: AC
Start: 2018-05-28 — End: 2018-05-28
  Filled 2018-05-28: qty 1

## 2018-05-28 MED ORDER — IOPAMIDOL (ISOVUE-370) INJECTION 76%
INTRAVENOUS | Status: AC
  Filled 2018-05-28: qty 100

## 2018-05-28 MED ORDER — COAG FACT XA RECOMBINANT 100 MG IV SOLR
900.0000 mg | Freq: Once | INTRAVENOUS | Status: AC
  Administered 2018-05-28: 400 mg via INTRAVENOUS
  Filled 2018-05-28: qty 90

## 2018-05-28 MED ORDER — ACETAMINOPHEN 325 MG PO TABS
650.0000 mg | ORAL_TABLET | Freq: Four times a day (QID) | ORAL | Status: DC | PRN
  Administered 2018-05-28 – 2018-06-10 (×12): 650 mg via ORAL
  Filled 2018-05-28 (×14): qty 2

## 2018-05-28 MED ORDER — DIGOXIN 250 MCG PO TABS
0.2500 mg | ORAL_TABLET | Freq: Every day | ORAL | Status: DC
  Administered 2018-05-28 – 2018-06-13 (×17): 0.25 mg via ORAL
  Filled 2018-05-28 (×5): qty 2
  Filled 2018-05-28 (×4): qty 1
  Filled 2018-05-28: qty 2
  Filled 2018-05-28: qty 1
  Filled 2018-05-28 (×3): qty 2
  Filled 2018-05-28: qty 1
  Filled 2018-05-28: qty 2
  Filled 2018-05-28: qty 1

## 2018-05-28 MED ORDER — EMPTY CONTAINERS FLEXIBLE MISC: 1800.0000 mg | Freq: Once | Status: DC

## 2018-05-28 MED ORDER — ACETAMINOPHEN 650 MG RE SUPP: 650.0000 mg | Freq: Four times a day (QID) | RECTAL | Status: DC | PRN

## 2018-05-28 NOTE — Progress Notes (Signed)
  Echocardiogram 2D Echocardiogram has been performed.  Samantha Stevenson Comp 05/28/2018, 12:35 PM

## 2018-05-28 NOTE — ED Notes (Addendum)
In pt's room hourly rounding when pt began c/o L arm itching; Pt's LFA IV noted to be infiltrated; pt's arm red, swollen, warm, "itching"; Medication stopped; IV removed, MD notified, pharmacy consulted; Per pharmacy, ice pack placed on arm; new IV placed, medication restarted

## 2018-05-28 NOTE — ED Provider Notes (Signed)
[facility] DEPT Provider Note   CSN: [id number] Arrival date & time: 05/28/18  0245     History   Chief Complaint Chief Complaint  Patient presents with  . Chest Pain  . Abdominal Pain  . Hematuria    HPI Samantha Stevenson is a 67 y.o. female.  Patient is a 67 year old female with past medical history of hypertension, pulmonary embolism, and atrial fibrillation for which she is on Xarelto.  She presents today with complaints of pain in her chest and upper abdomen.  This started several hours prior to presentation and is rapidly worsening.  She describes a severe pain in the front of her chest that radiates into her back.  She is unable to find a comfortable position.  She feels short of breath, but denies diaphoresis or fever.  The history is provided by the patient.  Chest Pain   This is a new problem. The current episode started 3 to 5 hours ago. The problem occurs constantly. The problem has been rapidly worsening. The pain is present in the substernal region. The pain is severe. The quality of the pain is described as sharp. The pain radiates to the upper back. Associated symptoms include abdominal pain. She has tried nothing for the symptoms. The treatment provided no relief.  Abdominal Pain   Associated symptoms include hematuria.  Hematuria  Associated symptoms include chest pain and abdominal pain.    Past Medical History:  Diagnosis Date  . Anemia   . Arthritis   . Atrial fibrillation ([place])   . Degenerative arthritis of hip    s/p L THR 12/2012  . Dyslipidemia   . Hyperlipidemia   . Hypertension   . Hypertension   . Migraines   . Myocardial infarction ([place]) 2000   due to atrial fib  . Pulmonary emboli ([place]) 12/2012 dx   post op (L THR), anticoag x 17mo    Patient Active Problem List   Diagnosis Date Noted  . Dysfunction of left eustachian tube 04/05/2018  . Preoperative clearance 02/27/2018  . Bilateral leg edema 02/27/2018    . Dyspnea 06/19/2016  . Routine general medical examination at a health care facility 06/24/2015  . Constipation 03/17/2015  . Myalgia and myositis 03/17/2015  . Dyslipidemia   . Atrial fibrillation ([place])   . Hypertension   . History of pulmonary embolism 01/10/2013  . Anemia 01/10/2013  . Arthritis 01/03/2013    Past Surgical History:  Procedure Laterality Date  . Breast Ultrasound Left 04/16/13   Done @ breast center Impression: no malignancy appearance noted on the screen study is consistent with a summation shadow  . DG TUMB RIGHT HAND Left    cyst removal  . PARTIAL HYSTERECTOMY    . TONSILLECTOMY    . TOTAL HIP ARTHROPLASTY Left 01/03/2013   Procedure: TOTAL HIP ARTHROPLASTY ANTERIOR APPROACH;  Surgeon: Mcarthur Rossetti, MD;  Location: [facility];  Service: Orthopedics;  Laterality: Left;  Left Total Hip Arthroplasty, Anterior Approach  . VAGINA RECONSTRUCTION SURGERY     vagina had closed up when 67 years old     OB History   None      Home Medications    Prior to Admission medications   Medication Sig Start Date End Date Taking? Authorizing Provider  acetaminophen (TYLENOL) 500 MG tablet Take 500 mg by mouth as needed.    [provider]  DIGITEK 250 MCG tablet TAKE 1 TABLET BY MOUTH EVERY MORNING 04/02/18   Pricilla Holm  A, MD  DILT-XR 240 MG 24 hr capsule TAKE 1 CAPSULE BY MOUTH EVERY MORNING 03/06/18   Hoyt Koch, MD  doxycycline (VIBRA-TABS) 100 MG tablet Take 1 tablet (100 mg total) by mouth 2 (two) times daily. 03/29/18   Marrian Salvage, FNP  ferrous sulfate 325 (65 FE) MG tablet Take 325 mg by mouth daily with breakfast. 01/05/13   Pete Pelt, PA-C  fluticasone [facility]) 50 MCG/ACT nasal spray Place 2 sprays into both nostrils daily. 03/29/18   Marrian Salvage, FNP  furosemide (LASIX) 20 MG tablet Take 20 mg by mouth daily.    [provider]  Multiple Vitamin (MULTIVITAMIN) tablet Take 1 tablet by mouth  daily.    [provider]  rivaroxaban (XARELTO) 20 MG TABS tablet Take 1 tablet (20 mg total) by mouth daily with supper. 02/27/18   Imogene Burn, PA-C  rosuvastatin (CRESTOR) 20 MG tablet TAKE 1 TABLET BY MOUTH EVERY DAY 04/02/18   Hoyt Koch, MD    Family History Family History  Problem Relation Age of Onset  . Heart disease Father   . Diabetes Mother   . Heart disease Mother        pacemaker  . Heart attack Mother 42  . Diabetes Sister   . Alcohol abuse Other   . Heart disease Other   . Hyperlipidemia Other   . Hypertension Other   . Diabetes Other   . Breast cancer Other     Social History Social History   Tobacco Use  . Smoking status: Never Smoker  . Smokeless tobacco: Never Used  Substance Use Topics  . Alcohol use: No  . Drug use: No     Allergies   Latex; Penicillins; Asa [aspirin]; Celebrex [celecoxib]; and Nsaids   Review of Systems Review of Systems  Cardiovascular: Positive for chest pain.  Gastrointestinal: Positive for abdominal pain.  Genitourinary: Positive for hematuria.  All other systems reviewed and are negative.    Physical Exam Updated Vital Signs BP (!) 151/75 (BP Location: Right Arm)   Pulse 87   Temp 97.6 F (36.4 C) (Oral)   Resp 16   Ht 5\' 4"  (1.626 m)   Wt 74.8 kg (165 lb)   SpO2 100%   BMI 28.32 kg/m   Physical Exam  Constitutional: She is oriented to person, place, and time. She appears well-developed and well-nourished. No distress.  Patient is anxious appearing and writhing.  HENT:  Head: Normocephalic and atraumatic.  Neck: Normal range of motion. Neck supple.  Cardiovascular: Normal rate and regular rhythm. Exam reveals no gallop and no friction rub.  No murmur heard. Pulmonary/Chest: Effort normal and breath sounds normal. No respiratory distress. She has no wheezes.  Abdominal: Soft. Bowel sounds are normal. She exhibits no distension. There is no tenderness.  Musculoskeletal: Normal range  of motion.  Neurological: She is alert and oriented to person, place, and time.  Skin: Skin is warm and dry. She is not diaphoretic.  Nursing note and vitals reviewed.    ED Treatments / Results  Labs (all labs ordered are listed, but only abnormal results are displayed) Labs Reviewed  COMPREHENSIVE METABOLIC PANEL  CBC WITH DIFFERENTIAL/PLATELET  LIPASE, BLOOD  I-STAT TROPONIN, ED  I-STAT CHEM 8, ED    EKG None  Radiology No results found.  Procedures Procedures (including critical care time)  Medications Ordered in ED Medications  sodium chloride 0.9 % bolus 1,000 mL (has no administration in time range)  morphine 4 MG/ML injection 4 mg (has no administration in time range)  ondansetron (ZOFRAN) injection 4 mg (has no administration in time range)     Initial Impression / Assessment and Plan / ED Course  I have reviewed the triage vital signs and the nursing notes.  Pertinent labs & imaging results that were available during my care of the patient were reviewed by me and considered in my medical decision making (see chart for details).  Patient on Xarelto for atrial fibrillation presenting with severe chest pain radiating to her back.  CT scan shows an intramural hematoma of the aorta extending from the left subclavian to the renal artery.  This finding was discussed with Dr. Prescott Gum from cardiothoracic surgery.  He is recommending an esmolol drip and transferred to [facility] for CT surgery evaluation.  CRITICAL CARE Performed by: Veryl Speak Total critical care time: 50 minutes Critical care time was exclusive of separately billable procedures and treating other patients. Critical care was necessary to treat or prevent imminent or life-threatening deterioration. Critical care was time spent personally by me on the following activities: development of treatment plan with patient and/or surrogate as well as nursing, discussions with consultants, evaluation of  patient's response to treatment, examination of patient, obtaining history from patient or surrogate, ordering and performing treatments and interventions, ordering and review of laboratory studies, ordering and review of radiographic studies, pulse oximetry and re-evaluation of patient's condition.   Final Clinical Impressions(s) / ED Diagnoses   Final diagnoses:  None    ED Discharge Orders    None       Veryl Speak, MD 05/28/18 [phone]

## 2018-05-28 NOTE — ED Triage Notes (Signed)
Pt presents to Ed from home for chest pain, ABD pain, and hemturia. Pt reports that the chest pain started around 2315, and then she started vomiting. Pt reports that before she left for hospital she had blood in her urine. Pt on xarelto. Previous hx of MI and PE.

## 2018-05-28 NOTE — ED Provider Notes (Signed)
MSE was initiated and I personally evaluated the patient and placed orders (if any) at  6:40 AM on May 28, 2018.  The patient appears stable so that the remainder of the MSE may be completed by another provider.  NCAT EOMI RRR CTAB NABS, soft FROM x 4  Skin warm and dry.    Esmolol ordered by me and Andexxa ordered as a reversal agent by me for Xarelto.    Case d/w Dr. Nils Pyle of CT surgery.     MDM Reviewed: nursing note and previous chart Reviewed previous: CT scan Interpretation: labs Total time providing critical care: 30-74 minutes (Andexxa and esmolol drip started by me). This excludes time spent performing separately reportable procedures and services. Consults: cardiovascular surgery (CT surgery)   . CRITICAL CARE Performed by: Carlisle Beers Total critical care time:  31 minutes Critical care time was exclusive of separately billable procedures and treating other patients. Critical care was necessary to treat or prevent imminent or life-threatening deterioration. Critical care was time spent personally by me on the following activities: development of treatment plan with patient and/or surrogate as well as nursing, discussions with consultants, evaluation of patient's response to treatment, examination of patient, obtaining history from patient or surrogate, ordering and performing treatments and interventions, ordering and review of laboratory studies, ordering and review of radiographic studies, pulse oximetry and re-evaluation of patient's condition.   Allen Egerton, MD 05/28/18 [id number]

## 2018-05-28 NOTE — H&P (Signed)
[address]       [place] [address]             [phone]        Letizia Friedly [place] Medical Record #[id number] Date of Birth: 1951-01-18  Referring: ED -MD Primary Care: Hoyt Koch, MD Primary Cardiologist:Will Meredith Leeds, MD  Chief Complaint:    Chief Complaint  Patient presents with  . Chest Pain  . Abdominal Pain  . Hematuria    History of Present Illness:     : Patient examined, images of CTA of thoracic aorta personally reviewed 67 year old hypertensive nondiabetic female presents with sudden onset of epigastric and back pain.  CTA performed in the ED demonstrated a thoracic aortic intramural hematoma extending from the left subclavian artery to the visceral vessels.  She has had associated nausea and vomiting.  No neurologic deficit.  She has been taking Xarelto for few months for history of atrial fibrillation.  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  She states she has had a previous MI but last echo 2017 showed normal LV function with a tricuspid aortic valve, no AI or ACS.   Thoracic aorta diameter including hematoma is 3.8 cm.  There is no pleural effusion.  The ascending aorta is not dilated.  The patient denies family history of aortic dissection   .  She is short with heavy body habitus.  She is a non-smoker.   The patient has been placed on IV esmolol and the Xarelto was reversed with factor X a recombinant.  Her pain is been treated with IV morphine      Current Activity/ Functional Status: lives alone, independent   Zubrod Score: At the time of surgery this patient's most appropriate activity status/level should be described as: []     0    Normal activity, no symptoms []     1    Restricted in physical strenuous activity but ambulatory, able to do out light work [x]     2    Ambulatory and capable of self care, unable to do work activities, up and about                  more than 50%  Of the time                            []     3    Only limited self care, in bed greater than 50% of waking hours []     4    Completely disabled, no self care, confined to bed or chair []     5    Moribund  Past Medical History:  Diagnosis Date  . Anemia   . Arthritis   . Atrial fibrillation ([place])   . Degenerative arthritis of hip    s/p L THR 12/2012  . Dyslipidemia   . Hyperlipidemia   . Hypertension   . Hypertension   . Migraines   . Myocardial infarction ([place]) 2000   due to atrial fib  . Pulmonary emboli ([place]) 12/2012 dx   post op (L THR), anticoag x 49mo    Past Surgical History:  Procedure Laterality Date  . Breast Ultrasound Left 04/16/13   Done @ breast center Impression: no malignancy appearance noted on the screen study is consistent with a summation shadow  . DG TUMB RIGHT HAND Left  cyst removal  . PARTIAL HYSTERECTOMY    . TONSILLECTOMY    . TOTAL HIP ARTHROPLASTY Left 01/03/2013   Procedure: TOTAL HIP ARTHROPLASTY ANTERIOR APPROACH;  Surgeon: Mcarthur Rossetti, MD;  Location: [facility];  Service: Orthopedics;  Laterality: Left;  Left Total Hip Arthroplasty, Anterior Approach  . VAGINA RECONSTRUCTION SURGERY     vagina had closed up when 67 years old    Social History   Tobacco Use  Smoking Status Never Smoker  Smokeless Tobacco Never Used    Social History   Substance and Sexual Activity  Alcohol Use No     Allergies  Allergen Reactions  . Latex Hives and Rash  . Penicillins Other (See Comments)    Severe swelling Has patient had a PCN reaction causing immediate rash, facial/tongue/throat swelling, SOB or lightheadedness with hypotension: Yes Has patient had a PCN reaction causing severe rash involving mucus membranes or skin necrosis: No Has patient had a PCN reaction that required hospitalization: Yes Has patient had a PCN reaction occurring within the last 10 years: No If all of the above answers are "NO", then may proceed  with Cephalosporin use.  Diona Fanti [Aspirin] Other (See Comments)    Makes stomach upset  . Celebrex [Celecoxib]     GI upset  . Nsaids     rash    Current Facility-Administered Medications  Medication Dose Route Frequency Provider Last Rate Last Dose  . acetaminophen (TYLENOL) tablet 650 mg  650 mg Oral Q6H PRN Ivin Poot, MD   650 mg at 05/28/18 5732   Or  . acetaminophen (TYLENOL) suppository 650 mg  650 mg Rectal Q6H PRN Ivin Poot, MD      . bisacodyl (DULCOLAX) EC tablet 5 mg  5 mg Oral Daily PRN Prescott Gum, Collier Salina, MD      . dextrose 5 %-0.45 % sodium chloride infusion   Intravenous Continuous Ivin Poot, MD 50 mL/hr at 05/28/18 0919    . digoxin (LANOXIN) tablet 0.25 mg  0.25 mg Oral Daily Prescott Gum, Collier Salina, MD      . diltiazem (CARDIZEM LA) 24 hr tablet 240 mg  240 mg Oral Daily Prescott Gum, Collier Salina, MD      . esmolol (BREVIBLOC) 2000 mg / 100 mL (20 mg/mL) infusion  25-300 mcg/kg/min Intravenous Continuous Prescott Gum, Collier Salina, MD 11.2 mL/hr at 05/28/18 0900 50 mcg/kg/min at 05/28/18 0900  . fentaNYL (DURAGESIC - dosed mcg/hr) 50 mcg  50 mcg Transdermal Q72H Prescott Gum, Collier Salina, MD      . furosemide (LASIX) tablet 20 mg  20 mg Oral Daily Prescott Gum, Collier Salina, MD      . HYDROmorphone (DILAUDID) injection 1 mg  1 mg Intravenous Q2H PRN Prescott Gum, Collier Salina, MD      . iopamidol (ISOVUE-370) 76 % injection           . morphine 4 MG/ML injection 4 mg  4 mg Intravenous Q3H PRN Ivin Poot, MD      . morphine 4 MG/ML injection           . rosuvastatin (CRESTOR) tablet 20 mg  20 mg Oral q1800 Prescott Gum, Collier Salina, MD      . senna-docusate (Senokot-S) tablet 1 tablet  1 tablet Oral QHS PRN Prescott Gum, Collier Salina, MD      . sodium chloride flush (NS) 0.9 % injection 3 mL  3 mL Intravenous Q12H Ivin Poot, MD        Medications Prior to  Admission  Medication Sig Dispense Refill Last Dose  . acetaminophen (TYLENOL) 500 MG tablet Take 1,000 mg by mouth as needed for moderate pain.    05/27/2018  at Unknown time  . DIGITEK 250 MCG tablet TAKE 1 TABLET BY MOUTH EVERY MORNING 90 tablet 0 05/27/2018 at Unknown time  . DILT-XR 240 MG 24 hr capsule TAKE 1 CAPSULE BY MOUTH EVERY MORNING 90 capsule 3 05/27/2018 at Unknown time  . ferrous sulfate 325 (65 FE) MG tablet Take 325 mg by mouth once a week.    Past Week at Unknown time  . fluticasone (FLONASE) 50 MCG/ACT nasal spray Place 2 sprays into both nostrils daily. 16 g 6 05/27/2018 at Unknown time  . furosemide (LASIX) 20 MG tablet Take 20 mg by mouth daily as needed for fluid.    Over a month ago at Unknown time  . Multiple Vitamin (MULTIVITAMIN) tablet Take 1 tablet by mouth daily.   Past Month at Unknown time  . rivaroxaban (XARELTO) 20 MG TABS tablet Take 1 tablet (20 mg total) by mouth daily with supper. 30 tablet 6 05/27/2018 at 2100  . rosuvastatin (CRESTOR) 20 MG tablet TAKE 1 TABLET BY MOUTH EVERY DAY 90 tablet 0 05/27/2018 at Unknown time  . doxycycline (VIBRA-TABS) 100 MG tablet Take 1 tablet (100 mg total) by mouth 2 (two) times daily. (Patient not taking: Reported on 05/28/2018) 20 tablet 0 Not Taking at Unknown time    Family History  Problem Relation Age of Onset  . Heart disease Father   . Diabetes Mother   . Heart disease Mother        pacemaker  . Heart attack Mother 56  . Diabetes Sister   . Alcohol abuse Other   . Heart disease Other   . Hyperlipidemia Other   . Hypertension Other   . Diabetes Other   . Breast cancer Other      Review of Systems:   ROS      Cardiac Review of Systems: Y or  [    ]= no  Chest Pain [ y   ]  Resting SOB [   ] Exertional SOB  [  ]  Orthopnea [  ]   Pedal Edema [   ]    Palpitations [  ] Syncope  [  ]   Presyncope [   ]  General Review of Systems: [Y] = yes [  ]=no Constitional: recent weight change [  ]; anorexia [  ]; fatigue [  ]; nausea [ y ]; night sweats [  ]; fever [  ]; or chills [  ]                                                               Dental: Last Dentist visit: 1  year  Eye : blurred vision [  ]; diplopia [   ]; vision changes [  ];  Amaurosis fugax[  ]; Resp: cough [  ];  wheezing[  ];  hemoptysis[  ]; shortness of breath[  ]; paroxysmal nocturnal dyspnea[  ]; dyspnea on exertion[  ]; or orthopnea[  ];  GI:  gallstones[  ], vomiting[ y ];  dysphagia[  ]; melena[  ];  hematochezia [  ]; heartburn[y];   Hx  of  Colonoscopy[  ]; GU: kidney stones [  ]; hematuria[  ];   dysuria [  ];  nocturia[  ];  history of     obstruction [  ]; urinary frequency [  ]             Skin: rash, swelling[  ];, hair loss[  ];  peripheral edema[  ];  or itching[  ]; Musculosketetal: myalgias[  ];  joint swelling[  ];  joint erythema[  ];  joint pain[  ];  back pain[y  ];  Heme/Lymph: bruising[  ];  bleeding[  ];  anemia[  ];  Neuro: TIA[  ];  headaches[  ];  stroke[  ];  vertigo[  ];  seizures[  ];   paresthesias[  ];  difficulty walking[  ];  Psych:depression[  ]; anxiety[  ];  Endocrine: diabetes[  ];  thyroid       Physical Exam: BP 115/83   Pulse 80   Temp 98.2 F (36.8 C) (Oral)   Resp (!) 21   Ht 5\' 4"  (1.626 m)   Wt 167 lb 12.3 oz (76.1 kg)   SpO2 100%   BMI 28.80 kg/m         Exam    General- alert and comfortable    Neck- no JVD, no cervical adenopathy palpable, no carotid bruit   Lungs- clear without rales, wheezes   Cor- regular rate and rhythm, no murmur , gallop   Abdomen- soft, slight tenderness but no guarding- bowel sounds diminished   Extremities - warm, non-tender, minimal edema. Palpable pulses in all extremities   Neuro- oriented, appropriate, no focal weakness   Diagnostic Studies & Laboratory data:     Recent Radiology Findings:   Ct Angio Chest/abd/pel For Dissection W And/or Wo Contrast  Result Date: 05/28/2018 CLINICAL DATA:  67 y/o F; Chest pain, AAS suspected, hemodynamically stable, no prior aorta intervention. EXAM: CT ANGIOGRAPHY CHEST, ABDOMEN AND PELVIS TECHNIQUE: Multidetector CT imaging through the chest, abdomen and  pelvis was performed using the standard protocol during bolus administration of intravenous contrast. Multiplanar reconstructed images and MIPs were obtained and reviewed to evaluate the vascular anatomy. CONTRAST:  168mL ISOVUE-370 IOPAMIDOL (ISOVUE-370) INJECTION 76% COMPARISON:  None. FINDINGS: CTA CHEST FINDINGS Cardiovascular: High attenuation acute intramural hematoma of the thoracic aorta extending from the left subclavian artery origin downstream to the level of the renal arteries. Aneurysmal dilatation of the aortic arch and descending aorta measuring up to 3.8 cm at the level of the distal arch. Normal heart size.  No pericardial effusion. Mediastinum/Nodes: No enlarged mediastinal, hilar, or axillary lymph nodes. Thyroid gland, trachea, and esophagus demonstrate no significant findings. Lungs/Pleura: Lungs are clear. No pleural effusion or pneumothorax. Minor atelectasis in the lung bases. Musculoskeletal: Pectus excavatum, Haller index 3.2. Review of the MIP images confirms the above findings. CTA ABDOMEN AND PELVIS FINDINGS VASCULAR Aorta: Intramural hematoma of the aorta at the level of the renal arteries. No dissection or aneurysm of the aorta downstream to the renal arteries. Celiac: Patent without evidence of aneurysm, dissection, vasculitis or significant stenosis. SMA: Patent without evidence of aneurysm, dissection, vasculitis or significant stenosis. Renals: Both renal arteries are patent without evidence of aneurysm, dissection, vasculitis, fibromuscular dysplasia or significant stenosis. IMA: Patent without evidence of aneurysm, dissection, vasculitis or significant stenosis. Inflow: Patent without evidence of aneurysm, dissection, vasculitis or significant stenosis. Veins: No obvious venous abnormality within the limitations of this arterial phase study. Review of the MIP images  confirms the above findings. NON-VASCULAR Hepatobiliary: No focal liver abnormality is seen. No gallstones,  gallbladder wall thickening, or biliary dilatation. Pancreas: Unremarkable. No pancreatic ductal dilatation or surrounding inflammatory changes. Spleen: Normal in size without focal abnormality. Adrenals/Urinary Tract: Adrenal glands are unremarkable. Kidneys are normal, without renal calculi, focal lesion, or hydronephrosis. Bladder is unremarkable. Stomach/Bowel: Stomach is within normal limits. Appendix appears normal. No evidence of bowel wall thickening, distention, or inflammatory changes. Lymphatic: No enlarged abdominal or pelvic lymph nodes. Reproductive: Status post hysterectomy. No adnexal masses. Other: No abdominal wall hernia or abnormality. No abdominopelvic ascites. Musculoskeletal: No fracture is seen. Review of the MIP images confirms the above findings. IMPRESSION: Acute intramural hematoma of the thoracic aorta extending from the left subclavian artery origin to the renal artery origins. 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 No dissection flap at this time. No propagation into aorta branch vessels identified. Critical Value/emergent results were called by telephone at the time of interpretation on 05/28/2018 at 5:14 am to Dr. Veryl Speak , who verbally acknowledged these results. Electronically Signed   By: Kristine Garbe M.D.   On: 05/28/2018 05:19     I have independently reviewed the above radiologic studies and discussed with the patient   Recent Lab Findings: Lab Results  Component Value Date   WBC 10.2 05/28/2018   HGB 11.6 (L) 05/28/2018   HCT 34.0 (L) 05/28/2018   PLT 333 05/28/2018   GLUCOSE 130 (H) 05/28/2018   CHOL 189 01/03/2018   TRIG 128.0 01/03/2018   HDL 46.70 01/03/2018   LDLCALC 117 (H) 01/03/2018   ALT 12 05/28/2018   AST 23 05/28/2018   NA 142 05/28/2018   K 3.9 05/28/2018   CL 104 05/28/2018   CREATININE 0.80 05/28/2018   BUN 17 05/28/2018   CO2 26 05/28/2018   TSH 2.24 08/27/2013   INR 1.11 01/10/2013    HGBA1C 6.2 06/19/2016      Assessment / Plan:     Intramural hematoma of descending thoracic aorta with back and epigastric pain   No aneurysmal dilatation of thoracic aorta Hx hypertension Hx PAF on Xarelto- now reversed  Admit to ICU for esmolol drip, BP control and observation          @ME1 @ 05/28/2018 10:15 AM

## 2018-05-28 NOTE — Progress Notes (Signed)
  Subjective: Patient examined, images of CTA of thoracic aorta personally reviewed 67 year old hypertensive nondiabetic female presents with sudden onset of epigastric and back pain.  CTA performed in the ED demonstrated a thoracic aortic intramural hematoma extending from the left subclavian artery to the visceral vessels.  She has had associated nausea and vomiting.  No neurologic deficit.  She has been taking Xarelto for few months for history of atrial fibrillation.  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  She states she has had a previous MI but last echo 2017 showed normal LV function with a tricuspid aortic valve, no AI or ACS.  Thoracic aorta diameter including hematoma is 3.8 cm.  There is no pleural effusion.  The ascending aorta is not dilated.  The patient denies family history of aortic dissection  .  She is short with heavy body habitus.  She is a non-smoker.  The patient has been placed on IV esmolol and the Xarelto was reversed with factor X a recombinant.  Her pain is been treated with IV morphine  Objective: Vital signs in last 24 hours: Temp:  [97.6 F (36.4 C)] 97.6 F (36.4 C) (08/06 0303) Pulse Rate:  [73-87] 73 (08/06 0830) Cardiac Rhythm: Normal sinus rhythm (08/06 0630) Resp:  [14-28] 18 (08/06 0830) BP: (116-151)/(56-115) 126/61 (08/06 0830) SpO2:  [90 %-100 %] 95 % (08/06 0830) Weight:  [165 lb (74.8 kg)] 165 lb (74.8 kg) (08/06 0303)  Hemodynamic parameters for last 24 hours:    Intake/Output from previous day: No intake/output data recorded. Intake/Output this shift: No intake/output data recorded.       Exam    General- alert and comfortable    Neck- no JVD, no cervical adenopathy palpable, no carotid bruit   Lungs- clear without rales, wheezes   Cor- regular rate and rhythm, no murmur , gallop.  Patient currently in sinus rhythm.   Abdomen- soft, bowel sounds diminished, no guarding   Extremities - warm,  non-tender, minimal edema.  Peripheral pulses intact in all extremities.   Neuro- oriented, appropriate, no focal weakness   Lab Results: Recent Labs    05/28/18 0417 05/28/18 0419  WBC 10.2  --   HGB 10.8* 11.6*  HCT 34.3* 34.0*  PLT 333  --    BMET:  Recent Labs    05/28/18 0417 05/28/18 0419  NA 142 142  K 4.0 3.9  CL 105 104  CO2 26  --   GLUCOSE 134* 130*  BUN 16 17  CREATININE 0.83 0.80  CALCIUM 8.9  --     PT/INR: No results for input(s): LABPROT, INR in the last 72 hours. ABG    Component Value Date/Time   TCO2 29 05/28/2018 0419   CBG (last 3)  No results for input(s): GLUCAP in the last 72 hours.  Assessment/Plan: S/P  Intrathoracic aortic hematoma extending from the left subclavian artery into the abdominal aorta.  No evidence of malperfusion.  Blood pressure fairly well controlled with IV esmolol.  Chronic anticoagulation has been reversed.  Patient will be admitted to the ICU for monitoring, blood pressure control, pain control,.  Plan of care reviewed with patient and daughters and all questions addressed.  Patient will be treated similar to a type B dissection with the expectation that medical therapy should provide best long-term outcome.   LOS: 0 days    Tharon Aquas Trigt III 05/28/2018

## 2018-05-28 NOTE — Progress Notes (Signed)
Patient ID: Samantha Stevenson, female   DOB: 1951/06/27, 67 y.o.   MRN: [id number] SICU Evening Rounds:  BP under good control on esmolol. HR dropping down into 40's so decreasing the dose.  Ambulating to bathroom  She is hungry so will start diet.

## 2018-05-29 ENCOUNTER — Inpatient Hospital Stay (HOSPITAL_COMMUNITY): Payer: Medicare Other

## 2018-05-29 DIAGNOSIS — I7100 Dissection of unspecified site of aorta: Secondary | ICD-10-CM

## 2018-05-29 LAB — COMPREHENSIVE METABOLIC PANEL
ALT: 11 U/L (ref 0–44)
AST: 16 U/L (ref 15–41)
Albumin: 2.9 g/dL — ABNORMAL LOW (ref 3.5–5.0)
Alkaline Phosphatase: 79 U/L (ref 38–126)
Anion gap: 6 (ref 5–15)
BUN: 12 mg/dL (ref 8–23)
CO2: 31 mmol/L (ref 22–32)
Calcium: 8.3 mg/dL — ABNORMAL LOW (ref 8.9–10.3)
Chloride: 100 mmol/L (ref 98–111)
Creatinine, Ser: 0.77 mg/dL (ref 0.44–1.00)
GFR calc Af Amer: >60 mL/min (ref >60)
GFR calc non Af Amer: >60 mL/min (ref >60)
Glucose, Bld: 107 mg/dL — ABNORMAL HIGH (ref 70–99)
Potassium: 3.7 mmol/L (ref 3.5–5.1)
Sodium: 137 mmol/L (ref 135–145)
Total Bilirubin: 1.1 mg/dL (ref 0.3–1.2)
Total Protein: 6.6 g/dL (ref 6.5–8.1)

## 2018-05-29 LAB — CBC
HCT: 31.5 % — ABNORMAL LOW (ref 36.0–46.0)
Hemoglobin: 9.4 g/dL — ABNORMAL LOW (ref 12.0–15.0)
MCH: 27 pg (ref 26.0–34.0)
MCHC: 29.8 g/dL — ABNORMAL LOW (ref 30.0–36.0)
MCV: 90.5 fL (ref 78.0–100.0)
Platelets: 271 10*3/uL (ref 150–400)
RBC: 3.48 MIL/uL — ABNORMAL LOW (ref 3.87–5.11)
RDW: 16 % — ABNORMAL HIGH (ref 11.5–15.5)
WBC: 8.1 10*3/uL (ref 4.0–10.5)

## 2018-05-29 LAB — HIV ANTIBODY (ROUTINE TESTING W REFLEX): HIV Screen 4th Generation wRfx: NONREACTIVE

## 2018-05-29 LAB — PROTIME-INR
INR: 1.21
Prothrombin Time: 15.2 seconds (ref 11.4–15.2)

## 2018-05-29 LAB — AMYLASE: Amylase: 46 U/L (ref 28–100)

## 2018-05-29 MED ORDER — OXYCODONE HCL 5 MG PO TABS
5.0000 mg | ORAL_TABLET | ORAL | Status: DC | PRN
  Administered 2018-05-29 – 2018-06-13 (×39): 5 mg via ORAL
  Filled 2018-05-29 (×41): qty 1

## 2018-05-29 MED ORDER — DOCUSATE SODIUM 100 MG PO CAPS
100.0000 mg | ORAL_CAPSULE | Freq: Two times a day (BID) | ORAL | Status: DC | PRN
  Administered 2018-06-03 – 2018-06-06 (×2): 100 mg via ORAL
  Filled 2018-05-29 (×4): qty 1

## 2018-05-29 MED ORDER — LABETALOL HCL 5 MG/ML IV SOLN: 10.0000 mg | INTRAVENOUS | Status: DC | PRN

## 2018-05-29 MED ORDER — FENTANYL CITRATE (PF) 100 MCG/2ML IJ SOLN
50.0000 ug | INTRAMUSCULAR | Status: DC | PRN
  Administered 2018-05-29 – 2018-06-02 (×4): 50 ug via INTRAVENOUS
  Filled 2018-05-29 (×4): qty 2

## 2018-05-29 MED ORDER — ONDANSETRON HCL 4 MG/2ML IJ SOLN
4.0000 mg | Freq: Four times a day (QID) | INTRAMUSCULAR | Status: DC | PRN
  Administered 2018-05-29 – 2018-06-21 (×8): 4 mg via INTRAVENOUS
  Filled 2018-05-29 (×10): qty 2

## 2018-05-29 MED ORDER — ALBUMIN HUMAN 5 % IV SOLN
12.5000 g | Freq: Once | INTRAVENOUS | Status: AC
  Administered 2018-05-29: 12.5 g via INTRAVENOUS
  Filled 2018-05-29: qty 250

## 2018-05-29 MED ORDER — POTASSIUM CHLORIDE CRYS ER 20 MEQ PO TBCR
20.0000 meq | EXTENDED_RELEASE_TABLET | Freq: Two times a day (BID) | ORAL | Status: DC
  Administered 2018-05-29 – 2018-06-01 (×7): 20 meq via ORAL
  Filled 2018-05-29 (×7): qty 1

## 2018-05-29 NOTE — Plan of Care (Signed)

## 2018-05-29 NOTE — Progress Notes (Addendum)
[facility] DAILY ICU PROGRESS NOTE                   [address]            [place] [address]          [phone]       Total Length of Stay:  LOS: 1 day   Subjective:  Patient continues to complain of epigastric and back pain.  She states the pain is moving.  Persistent N/V.Marland Kitchen She thought eating breakfast might help, but it actually has made her nausea worse.   Objective: Vital signs in last 24 hours: Temp:  [97 F (36.1 C)-99.2 F (37.3 C)] 99.2 F (37.3 C) (08/07 0400) Pulse Rate:  [45-157] 63 (08/07 0600) Cardiac Rhythm: Sinus bradycardia (08/07 0400) Resp:  [12-30] 19 (08/07 0600) BP: (97-130)/(49-83) 109/55 (08/07 0600) SpO2:  [89 %-100 %] 96 % (08/07 0600) Weight:  [167 lb 12.3 oz (76.1 kg)-168 lb 6.9 oz (76.4 kg)] 168 lb 6.9 oz (76.4 kg) (08/07 0500)  Filed Weights   05/28/18 0303 05/28/18 0930 05/29/18 0500  Weight: 165 lb (74.8 kg) 167 lb 12.3 oz (76.1 kg) 168 lb 6.9 oz (76.4 kg)    Weight change: 2 lb 12.3 oz (1.256 kg)   Intake/Output from previous day: 08/06 0701 - 08/07 0700 In: 9381 [P.O.:420; I.V.:1112] Out: 1250 [Urine:1250]  Current Meds: Scheduled Meds: . digoxin  0.25 mg Oral Daily  . diltiazem  240 mg Oral Daily  . furosemide  20 mg Oral Daily  . rosuvastatin  20 mg Oral q1800  . sodium chloride flush  3 mL Intravenous Q12H   Continuous Infusions: . dextrose 5 % and 0.45% NaCl 50 mL/hr at 05/29/18 0600  . esmolol Stopped (05/28/18 1904)   PRN Meds:.acetaminophen **OR** acetaminophen, bisacodyl, fentaNYL (SUBLIMAZE) injection, HYDROcodone-acetaminophen, HYDROmorphone (DILAUDID) injection, morphine injection, senna-docusate  General appearance: alert, cooperative and no distress Heart: regular rate and rhythm Lungs: clear to auscultation bilaterally Abdomen: soft, non-tender; bowel sounds normal; no masses,  no organomegaly Extremities: no edema, + pulses Neuro: grossly intact  Lab Results: CBC: Recent Labs     05/28/18 0417 05/28/18 0419 05/29/18 0247  WBC 10.2  --  8.1  HGB 10.8* 11.6* 9.4*  HCT 34.3* 34.0* 31.5*  PLT 333  --  271   BMET:  Recent Labs    05/28/18 0417 05/28/18 0419 05/29/18 0247  NA 142 142 137  K 4.0 3.9 3.7  CL 105 104 100  CO2 26  --  31  GLUCOSE 134* 130* 107*  BUN 16 17 12   CREATININE 0.83 0.80 0.77  CALCIUM 8.9  --  8.3*    CMET: Lab Results  Component Value Date   WBC 8.1 05/29/2018   HGB 9.4 (L) 05/29/2018   HCT 31.5 (L) 05/29/2018   PLT 271 05/29/2018   GLUCOSE 107 (H) 05/29/2018   CHOL 189 01/03/2018   TRIG 128.0 01/03/2018   HDL 46.70 01/03/2018   LDLCALC 117 (H) 01/03/2018   ALT 11 05/29/2018   AST 16 05/29/2018   NA 137 05/29/2018   K 3.7 05/29/2018   CL 100 05/29/2018   CREATININE 0.77 05/29/2018   BUN 12 05/29/2018   CO2 31 05/29/2018   TSH 2.24 08/27/2013   INR 1.21 05/29/2018   HGBA1C 6.2 06/19/2016      PT/INR:  Recent Labs    05/29/18 0247  LABPROT 15.2  INR 1.21   Radiology: No results found.  Assessment/Plan:  1. Cv- NSR, BP is well controlled- on Esmolol drip, Digoxin, and Cardizem 2. Pulm- no acute issues, continue IS 3. Renal- creatinine remains WNL 4. GI- persistent N/V, will order zofran prn 5. Dispo- patient hemodynamically stable, BP is controlled on Esmolol, Cardizem... Persistent nausea will add prn zofran... She is complaining of pain which she states is moving around, continue IV pain medication, will need to repeat CT scan in the future    Erin Barrett 05/29/2018 7:56 AM   Intramural hematoma of descending thoracic aorta Patient continues to have chest back and shoulder pain and nausea.  Tolerating oral intake however an abdominal exam benign.  Will change pain regimen, stop esmolol drip, keep in ICU for observation and mobilize out of bed to chair.  Hold anticoagulation even though patient is in chronic atrial fibrillation.  Lovenox and aspirin contraindicated with recent extensive aortic  intramural hematoma.  Check amylase for possible pancreatitis from abdominal aortic involvement.  LFTs appear normal.  patient examined and medical record reviewed,agree with above note. Tharon Aquas Trigt III 05/29/2018

## 2018-05-29 NOTE — Progress Notes (Signed)
Patient ID: Samantha Stevenson, female   DOB: [birth year], 67 y.o.   MRN: [id number] EVENING ROUNDS NOTE :     [address]       [place] [address]             [phone]                     Total Length of Stay:  LOS: 1 day  BP 110/70   Pulse 72   Temp 98.1 F (36.7 C)   Resp 19   Ht 5\' 4"  (1.626 m)   Wt 168 lb 6.9 oz (76.4 kg)   SpO2 98%   BMI 28.91 kg/m   .Intake/Output      08/06 0701 - 08/07 0700 08/07 0701 - 08/08 0700   P.O. 420 240   I.V. (mL/kg) 1112 (14.6) 185.8 (2.4)   Total Intake(mL/kg) 1532 (20.1) 425.8 (5.6)   Urine (mL/kg/hr) 1250 (0.7) 500 (0.6)   Total Output 1250 500   Net +282 -74.2          . dextrose 5 % and 0.45% NaCl 10 mL/hr at 05/29/18 1100     Lab Results  Component Value Date   WBC 8.1 05/29/2018   HGB 9.4 (L) 05/29/2018   HCT 31.5 (L) 05/29/2018   PLT 271 05/29/2018   GLUCOSE 107 (H) 05/29/2018   CHOL 189 01/03/2018   TRIG 128.0 01/03/2018   HDL 46.70 01/03/2018   LDLCALC 117 (H) 01/03/2018   ALT 11 05/29/2018   AST 16 05/29/2018   NA 137 05/29/2018   K 3.7 05/29/2018   CL 100 05/29/2018   CREATININE 0.77 05/29/2018   BUN 12 05/29/2018   CO2 31 05/29/2018   TSH 2.24 08/27/2013   INR 1.21 05/29/2018   HGBA1C 6.2 06/19/2016    Ct Angio Chest/abd/pel For Dissection W And/or Wo Contrast  Result Date: 05/28/2018 CLINICAL DATA:  67 y/o F; Chest pain, AAS suspected, hemodynamically stable, no prior aorta intervention. EXAM: CT ANGIOGRAPHY CHEST, ABDOMEN AND PELVIS TECHNIQUE: Multidetector CT imaging through the chest, abdomen and pelvis was performed using the standard protocol during bolus administration of intravenous contrast. Multiplanar reconstructed images and MIPs were obtained and reviewed to evaluate the vascular anatomy. CONTRAST:  168mL ISOVUE-370 IOPAMIDOL (ISOVUE-370) INJECTION 76% COMPARISON:  None. FINDINGS: CTA CHEST FINDINGS Cardiovascular: High attenuation acute intramural hematoma of the thoracic  aorta extending from the left subclavian artery origin downstream to the level of the renal arteries. Aneurysmal dilatation of the aortic arch and descending aorta measuring up to 3.8 cm at the level of the distal arch. Normal heart size.  No pericardial effusion. Mediastinum/Nodes: No enlarged mediastinal, hilar, or axillary lymph nodes. Thyroid gland, trachea, and esophagus demonstrate no significant findings. Lungs/Pleura: Lungs are clear. No pleural effusion or pneumothorax. Minor atelectasis in the lung bases. Musculoskeletal: Pectus excavatum, Haller index 3.2. Review of the MIP images confirms the above findings. CTA ABDOMEN AND PELVIS FINDINGS VASCULAR Aorta: Intramural hematoma of the aorta at the level of the renal arteries. No dissection or aneurysm of the aorta downstream to the renal arteries. Celiac: Patent without evidence of aneurysm, dissection, vasculitis or significant stenosis. SMA: Patent without evidence of aneurysm, dissection, vasculitis or significant stenosis. Renals: Both renal arteries are patent without evidence of aneurysm, dissection, vasculitis, fibromuscular dysplasia or significant stenosis. IMA: Patent without evidence of aneurysm, dissection, vasculitis or significant stenosis. Inflow: Patent without evidence of aneurysm, dissection, vasculitis or significant stenosis. Veins: No obvious  venous abnormality within the limitations of this arterial phase study. Review of the MIP images confirms the above findings. NON-VASCULAR Hepatobiliary: No focal liver abnormality is seen. No gallstones, gallbladder wall thickening, or biliary dilatation. Pancreas: Unremarkable. No pancreatic ductal dilatation or surrounding inflammatory changes. Spleen: Normal in size without focal abnormality. Adrenals/Urinary Tract: Adrenal glands are unremarkable. Kidneys are normal, without renal calculi, focal lesion, or hydronephrosis. Bladder is unremarkable. Stomach/Bowel: Stomach is within normal limits.  Appendix appears normal. No evidence of bowel wall thickening, distention, or inflammatory changes. Lymphatic: No enlarged abdominal or pelvic lymph nodes. Reproductive: Status post hysterectomy. No adnexal masses. Other: No abdominal wall hernia or abnormality. No abdominopelvic ascites. Musculoskeletal: No fracture is seen. Review of the MIP images confirms the above findings. IMPRESSION: Acute intramural hematoma of the thoracic aorta extending from the left subclavian artery origin to the renal artery origins. 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 No dissection flap at this time. No propagation into aorta branch vessels identified. Critical Value/emergent results were called by telephone at the time of interpretation on 05/28/2018 at 5:14 am to Dr. Veryl Speak , who verbally acknowledged these results. Electronically Signed   By: Kristine Garbe M.D.   On: 05/28/2018 05:19    Previous hx of PE 2014- after hip surgery Now type III aortic dissection/imh bp stable less then 120's Some back pain but better then Monday  On 2 l [place] 02  Grace Isaac MD  Beeper [phone] Office [phone] 05/29/2018 5:32 PM

## 2018-05-30 ENCOUNTER — Inpatient Hospital Stay (HOSPITAL_COMMUNITY): Payer: Medicare Other

## 2018-05-30 LAB — BASIC METABOLIC PANEL
Anion gap: 8 (ref 5–15)
BUN: 5 mg/dL — ABNORMAL LOW (ref 8–23)
CO2: 30 mmol/L (ref 22–32)
Calcium: 8.6 mg/dL — ABNORMAL LOW (ref 8.9–10.3)
Chloride: 101 mmol/L (ref 98–111)
Creatinine, Ser: 0.77 mg/dL (ref 0.44–1.00)
GFR calc Af Amer: >60 mL/min (ref >60)
GFR calc non Af Amer: >60 mL/min (ref >60)
Glucose, Bld: 103 mg/dL — ABNORMAL HIGH (ref 70–99)
Potassium: 4 mmol/L (ref 3.5–5.1)
Sodium: 139 mmol/L (ref 135–145)

## 2018-05-30 NOTE — Progress Notes (Addendum)
[facility] DAILY ICU PROGRESS NOTE                   [address]            [place] [address]          [phone]       Total Length of Stay:  LOS: 2 days   Subjective:  No new complaints.  Continues to have pain especially with walking and sitting up.  Denies any further nausea.  Objective: Vital signs in last 24 hours: Temp:  [98.1 F (36.7 C)-99.7 F (37.6 C)] 99.7 F (37.6 C) (08/08 0816) Pulse Rate:  [58-77] 63 (08/08 0700) Cardiac Rhythm: Sinus bradycardia (08/08 0500) Resp:  [15-28] 15 (08/08 0700) BP: (80-128)/(39-84) 124/61 (08/08 0700) SpO2:  [95 %-100 %] 98 % (08/08 0700)  Filed Weights   05/28/18 0303 05/28/18 0930 05/29/18 0500  Weight: 74.8 kg 76.1 kg 76.4 kg    Weight change:    Intake/Output from previous day: 08/07 0701 - 08/08 0700 In: 476.8 [P.O.:240; I.V.:236.8] Out: 1900 [Urine:1900]  Intake/Output this shift: Total I/O In: -  Out: 400 [Urine:400]  Current Meds: Scheduled Meds: . digoxin  0.25 mg Oral Daily  . diltiazem  240 mg Oral Daily  . furosemide  20 mg Oral Daily  . potassium chloride  20 mEq Oral BID  . rosuvastatin  20 mg Oral q1800  . sodium chloride flush  3 mL Intravenous Q12H   Continuous Infusions: . dextrose 5 % and 0.45% NaCl 10 mL/hr at 05/29/18 1900   PRN Meds:.acetaminophen **OR** acetaminophen, bisacodyl, docusate sodium, fentaNYL (SUBLIMAZE) injection, labetalol, ondansetron (ZOFRAN) IV, oxyCODONE, senna-docusate  General appearance: alert, cooperative and no distress Heart: regular rate and rhythm Lungs: diminished breath sounds bibasilar Abdomen: soft, non-tender; bowel sounds normal; no masses,  no organomegaly Extremities: extremities normal, atraumatic, no cyanosis or edema and + pulses Neuro: grossly normal  Lab Results: CBC: Recent Labs    05/28/18 0417 05/28/18 0419 05/29/18 0247  WBC 10.2  --  8.1  HGB 10.8* 11.6* 9.4*  HCT 34.3* 34.0* 31.5*  PLT 333  --  271   BMET:  Recent  Labs    05/29/18 0247 05/30/18 0427  NA 137 139  K 3.7 4.0  CL 100 101  CO2 31 30  GLUCOSE 107* 103*  BUN 12 5*  CREATININE 0.77 0.77  CALCIUM 8.3* 8.6*    CMET: Lab Results  Component Value Date   WBC 8.1 05/29/2018   HGB 9.4 (L) 05/29/2018   HCT 31.5 (L) 05/29/2018   PLT 271 05/29/2018   GLUCOSE 103 (H) 05/30/2018   CHOL 189 01/03/2018   TRIG 128.0 01/03/2018   HDL 46.70 01/03/2018   LDLCALC 117 (H) 01/03/2018   ALT 11 05/29/2018   AST 16 05/29/2018   NA 139 05/30/2018   K 4.0 05/30/2018   CL 101 05/30/2018   CREATININE 0.77 05/30/2018   BUN 5 (L) 05/30/2018   CO2 30 05/30/2018   TSH 2.24 08/27/2013   INR 1.21 05/29/2018   HGBA1C 6.2 06/19/2016   PT/INR:  Recent Labs    05/29/18 0247  LABPROT 15.2  INR 1.21   Radiology: No results found.   Assessment/Plan:  1. CV- NSR, BP remains controlled- continue Cardizem and Digoxin.Marland Kitchen She has a history of chronic atrial fibrillation... She can not resume her home xarelto due to Intramural hematoma of descending thoracic aorta 2. Pulm- no acute issues, CXR appears to be  worsened today with atelectasis vs. Bloody effusion? 3. Renal- creatinine remains stable, will give IV lasix today 4. GI- nausea resolved 5. Dispo- patient with continued pain, CXR looks worse today with likely bloody pleural effusion, will give a dose of IV lasix today, plan to repeat CTA CAP tomorrow    Ellwood Handler 05/30/2018 8:47 AM    Chart reviewed, patient examined, agree with above. Hemodynamically stable in sinus rhythm. Urine output ok CXR shows more opacity on the left that is probably atelectasis and some effusion. CTA on 8/6 showed clear lungs and no effusion with stable intramural hematoma. Plan to repeat study tomorrow. It would not be unusual to have a sympathetic effusion in response to this hematoma. Repeat CBC in am.

## 2018-05-30 NOTE — Progress Notes (Signed)
Patient ID: Samantha Stevenson, female   DOB: 1950/11/08, 66 y.o.   MRN: [id number] [facility] Evening Rounds  Hemodynamically stable today No new problems Plan CTA chest/abd/pelvis in am.

## 2018-05-31 ENCOUNTER — Inpatient Hospital Stay (HOSPITAL_COMMUNITY): Payer: Medicare Other

## 2018-05-31 ENCOUNTER — Encounter (HOSPITAL_COMMUNITY): Payer: Self-pay | Admitting: Physician Assistant

## 2018-05-31 HISTORY — PX: IR THORACENTESIS RIGHT ASP PLEURAL SPACE W/IMG GUIDE: IMG5380

## 2018-05-31 LAB — CBC
HCT: 31.7 % — ABNORMAL LOW (ref 36.0–46.0)
Hemoglobin: 9.7 g/dL — ABNORMAL LOW (ref 12.0–15.0)
MCH: 26.9 pg (ref 26.0–34.0)
MCHC: 30.6 g/dL (ref 30.0–36.0)
MCV: 88.1 fL (ref 78.0–100.0)
Platelets: 263 10*3/uL (ref 150–400)
RBC: 3.6 MIL/uL — ABNORMAL LOW (ref 3.87–5.11)
RDW: 15.7 % — ABNORMAL HIGH (ref 11.5–15.5)
WBC: 9.6 10*3/uL (ref 4.0–10.5)

## 2018-05-31 MED ORDER — LIDOCAINE HCL (PF) 2 % IJ SOLN
INTRAMUSCULAR | Status: AC
  Filled 2018-05-31: qty 20

## 2018-05-31 MED ORDER — LIDOCAINE HCL (PF) 2 % IJ SOLN
INTRAMUSCULAR | Status: DC | PRN
  Administered 2018-05-31: 10 mL

## 2018-05-31 MED ORDER — IOPAMIDOL (ISOVUE-370) INJECTION 76%
100.0000 mL | Freq: Once | INTRAVENOUS | Status: AC | PRN
  Administered 2018-05-31: 100 mL via INTRAVENOUS

## 2018-05-31 MED ORDER — IOPAMIDOL (ISOVUE-370) INJECTION 76%
INTRAVENOUS | Status: AC
  Filled 2018-05-31: qty 100

## 2018-05-31 MED ORDER — DOCUSATE SODIUM 100 MG PO CAPS
100.0000 mg | ORAL_CAPSULE | Freq: Two times a day (BID) | ORAL | Status: DC
  Administered 2018-05-31 – 2018-06-22 (×37): 100 mg via ORAL
  Filled 2018-05-31 (×43): qty 1

## 2018-05-31 NOTE — Procedures (Signed)
PROCEDURE SUMMARY:  Successful US guided left thoracentesis. Yielded 400 mL of clear yellow fluid. Patient tolerated procedure well. No immediate complications.  Post procedure chest X-ray reveals no pneumothorax  Temisha Murley S Marcianna Daily PA-C 05/31/2018 1:29 PM

## 2018-05-31 NOTE — Progress Notes (Addendum)
CT surgery p.m. Rounds  Patient feels better after left thoracentesis removed 400 cc-cough improved Still some epigastric discomfort but nausea resolved Follow-up chest x-ray in a.m. Continue close monitoring and trial of blood pressure type B aortic intrmural hematoma

## 2018-05-31 NOTE — Progress Notes (Addendum)
[facility] DAILY ICU PROGRESS NOTE                   [address]            [place] [address]          [phone]       Total Length of Stay:  LOS: 3 days   Subjective:  No new complaints.  Up moving around her room this morning.  She continues to have some discomfort.  She has also developed a cough.  Objective: Vital signs in last 24 hours: Temp:  [99.4 F (37.4 C)-99.7 F (37.6 C)] 99.5 F (37.5 C) (08/08 2351) Pulse Rate:  [65-92] 70 (08/09 0700) Cardiac Rhythm: Normal sinus rhythm (08/09 0405) Resp:  [16-33] 16 (08/09 0700) BP: (110-138)/(56-92) 113/56 (08/09 0700) SpO2:  [90 %-98 %] 97 % (08/09 0700) Weight:  [75.6 kg] 75.6 kg (08/09 0453)  Filed Weights   05/28/18 0930 05/29/18 0500 05/31/18 0453  Weight: 76.1 kg 76.4 kg 75.6 kg    Weight change:    Intake/Output from previous day: 08/08 0701 - 08/09 0700 In: -  Out: 2050 [Urine:2050]  Current Meds: Scheduled Meds: . digoxin  0.25 mg Oral Daily  . diltiazem  240 mg Oral Daily  . furosemide  20 mg Oral Daily  . iopamidol      . potassium chloride  20 mEq Oral BID  . rosuvastatin  20 mg Oral q1800  . sodium chloride flush  3 mL Intravenous Q12H   Continuous Infusions: . dextrose 5 % and 0.45% NaCl 10 mL/hr at 05/29/18 1900   PRN Meds:.acetaminophen **OR** acetaminophen, bisacodyl, docusate sodium, fentaNYL (SUBLIMAZE) injection, labetalol, ondansetron (ZOFRAN) IV, oxyCODONE, senna-docusate  General appearance: alert, cooperative and no distress Heart: regular rate and rhythm Lungs: diminished breath sounds on left Abdomen: soft, non-tender; bowel sounds normal; no masses,  no organomegaly Extremities: extremities normal, atraumatic, no cyanosis or edema  Lab Results: CBC: Recent Labs    05/29/18 0247 05/31/18 0328  WBC 8.1 9.6  HGB 9.4* 9.7*  HCT 31.5* 31.7*  PLT 271 263   BMET:  Recent Labs    05/29/18 0247 05/30/18 0427  NA 137 139  K 3.7 4.0  CL 100 101  CO2 31 30    GLUCOSE 107* 103*  BUN 12 5*  CREATININE 0.77 0.77  CALCIUM 8.3* 8.6*    CMET: Lab Results  Component Value Date   WBC 9.6 05/31/2018   HGB 9.7 (L) 05/31/2018   HCT 31.7 (L) 05/31/2018   PLT 263 05/31/2018   GLUCOSE 103 (H) 05/30/2018   CHOL 189 01/03/2018   TRIG 128.0 01/03/2018   HDL 46.70 01/03/2018   LDLCALC 117 (H) 01/03/2018   ALT 11 05/29/2018   AST 16 05/29/2018   NA 139 05/30/2018   K 4.0 05/30/2018   CL 101 05/30/2018   CREATININE 0.77 05/30/2018   BUN 5 (L) 05/30/2018   CO2 30 05/30/2018   TSH 2.24 08/27/2013   INR 1.21 05/29/2018   HGBA1C 6.2 06/19/2016      PT/INR:  Recent Labs    05/29/18 0247  LABPROT 15.2  INR 1.21   Radiology: Ct Angio Chest/abd/pel For Dissection W And/or W/wo  Result Date: 05/31/2018 CLINICAL DATA:  Epigastric and back pain. EXAM: CT ANGIOGRAPHY CHEST, ABDOMEN AND PELVIS TECHNIQUE: Multidetector CT imaging through the chest, abdomen and pelvis was performed using the standard protocol during bolus administration of intravenous contrast. Multiplanar reconstructed images  and MIPs were obtained and reviewed to evaluate the vascular anatomy. CONTRAST:  129mL ISOVUE-370 IOPAMIDOL (ISOVUE-370) INJECTION 76% COMPARISON:  CT scan of May 28, 2018. FINDINGS: CTA CHEST FINDINGS Cardiovascular: There is continued presence of intramural hematoma extending from origin of left subclavian artery through descending thoracic aorta. No definite dissection is seen. Pulmonary arteries are unremarkable. Mild cardiomegaly is noted. No pericardial effusion is noted. The hematoma appears to be slightly smaller compared to prior exam, with proximal descending thoracic aorta having maximum measured diameter of 3.6 cm. Mediastinum/Nodes: No enlarged mediastinal, hilar, or axillary lymph nodes. Thyroid gland, trachea, and esophagus demonstrate no significant findings. Lungs/Pleura: No pneumothorax is noted. There is interval development of large left pleural  effusion with associated atelectasis of the left and upper lobes. Small right pleural effusion is noted with adjacent subsegmental atelectasis. Musculoskeletal: No chest wall abnormality. No acute or significant osseous findings. Review of the MIP images confirms the above findings. CTA ABDOMEN AND PELVIS FINDINGS VASCULAR Aorta: Intramural hematoma from thoracic aorta extends into the proximal abdominal aorta. There is no evidence of abdominal aortic aneurysm or dissection. Celiac: Patent without evidence of aneurysm, dissection, vasculitis or significant stenosis. SMA: Patent without evidence of aneurysm, dissection, vasculitis or significant stenosis. Renals: Both renal arteries are patent without evidence of aneurysm, dissection, vasculitis, fibromuscular dysplasia or significant stenosis. IMA: Patent without evidence of aneurysm, dissection, vasculitis or significant stenosis. Inflow: Patent without evidence of aneurysm, dissection, vasculitis or significant stenosis. Veins: No obvious venous abnormality within the limitations of this arterial phase study. Review of the MIP images confirms the above findings. NON-VASCULAR Hepatobiliary: No focal liver abnormality is seen. No gallstones, gallbladder wall thickening, or biliary dilatation. Pancreas: Unremarkable. No pancreatic ductal dilatation or surrounding inflammatory changes. Spleen: Normal in size without focal abnormality. Adrenals/Urinary Tract: Adrenal glands are unremarkable. Kidneys are normal, without renal calculi, focal lesion, or hydronephrosis. Bladder is unremarkable. Stomach/Bowel: Stomach is within normal limits. Appendix appears normal. No evidence of bowel wall thickening, distention, or inflammatory changes. Sigmoid diverticulosis is noted without inflammation. Lymphatic: No significant adenopathy is noted. Reproductive: Status post hysterectomy. No adnexal masses. Other: No abdominal wall hernia or abnormality. No abdominopelvic ascites.  Musculoskeletal: No acute or significant osseous findings. Review of the MIP images confirms the above findings. IMPRESSION: Continued presence of large intramural hematoma extending from origin of left subclavian artery and through descending thoracic aorta into proximal abdominal aorta to the level of the renal arteries. This appears to be slightly smaller compared to prior exam. Interval development of large left-sided pleural effusion with associated atelectasis of the left and upper lobes. Small right pleural effusion is also noted with associated atelectasis of right lower lobe. No evidence of abdominal aortic aneurysm or dissection. No evidence of significant mesenteric or renal artery stenosis. Sigmoid diverticulosis without inflammation. Aortic Atherosclerosis (ICD10-I70.0). Electronically Signed   By: Marijo Conception, M.D.   On: 05/31/2018 07:37     Assessment/Plan:  1. Descending Intramural Thoracic Aorta Hematoma- repeat CT scan today, shows this has decreased in size 2. CV- NSR, BP remains well controlled on Cardizem, home Digoxin for chronic A. Fib, she will no longer be a candidate for anticoagulation 3. Pulm- new onset cough, sats have decreased some.. CT scan shows large left pleural effusion.. Will order Thoracentesis 4. Dispo- patient stable, decrease in size of hematoma on repeat scan, large left pleural effusion, will get Thoracentesis, BP is well controlled, continue current care Ellwood Handler 05/31/2018 8:11 AM   Patient examined  and CT scan images personally reviewed Stable mediastinal hematoma from descending aortic intramural hematoma.  Dry cough from sympathetic left pleural effusion-we will proceed with ultrasound-guided thoracentesis. Continue observation in ICU.  patient examined and medical record reviewed,agree with above note. Tharon Aquas Trigt III 05/31/2018

## 2018-06-01 ENCOUNTER — Inpatient Hospital Stay (HOSPITAL_COMMUNITY): Payer: Medicare Other

## 2018-06-01 LAB — CBC
HCT: 30 % — ABNORMAL LOW (ref 36.0–46.0)
Hemoglobin: 9.1 g/dL — ABNORMAL LOW (ref 12.0–15.0)
MCH: 27 pg (ref 26.0–34.0)
MCHC: 30.3 g/dL (ref 30.0–36.0)
MCV: 89 fL (ref 78.0–100.0)
Platelets: 287 10*3/uL (ref 150–400)
RBC: 3.37 MIL/uL — ABNORMAL LOW (ref 3.87–5.11)
RDW: 15.9 % — ABNORMAL HIGH (ref 11.5–15.5)
WBC: 9.2 10*3/uL (ref 4.0–10.5)

## 2018-06-01 LAB — BASIC METABOLIC PANEL
Anion gap: 9 (ref 5–15)
BUN: 7 mg/dL — ABNORMAL LOW (ref 8–23)
CO2: 28 mmol/L (ref 22–32)
Calcium: 8.4 mg/dL — ABNORMAL LOW (ref 8.9–10.3)
Chloride: 99 mmol/L (ref 98–111)
Creatinine, Ser: 0.96 mg/dL (ref 0.44–1.00)
GFR calc Af Amer: >60 mL/min (ref >60)
GFR calc non Af Amer: >60 mL/min (ref >60)
Glucose, Bld: 113 mg/dL — ABNORMAL HIGH (ref 70–99)
Potassium: 4.5 mmol/L (ref 3.5–5.1)
Sodium: 136 mmol/L (ref 135–145)

## 2018-06-01 MED ORDER — METOPROLOL SUCCINATE ER 25 MG PO TB24: 12.5000 mg | ORAL_TABLET | Freq: Every day | ORAL | Status: DC

## 2018-06-01 MED ORDER — LABETALOL HCL 5 MG/ML IV SOLN
10.0000 mg | INTRAVENOUS | Status: DC | PRN
  Administered 2018-06-03: 10 mg via INTRAVENOUS
  Filled 2018-06-01 (×3): qty 4

## 2018-06-01 MED ORDER — POTASSIUM CHLORIDE CRYS ER 10 MEQ PO TBCR
10.0000 meq | EXTENDED_RELEASE_TABLET | Freq: Every day | ORAL | Status: DC
  Administered 2018-06-02 – 2018-06-23 (×21): 10 meq via ORAL
  Filled 2018-06-01 (×22): qty 1

## 2018-06-01 MED ORDER — SORBITOL 70 % SOLN
30.0000 mL | Freq: Every morning | Status: AC
  Administered 2018-06-01 – 2018-06-03 (×2): 30 mL via ORAL
  Filled 2018-06-01 (×2): qty 30

## 2018-06-01 MED ORDER — FUROSEMIDE 20 MG PO TABS
20.0000 mg | ORAL_TABLET | Freq: Two times a day (BID) | ORAL | Status: DC
  Administered 2018-06-02: 20 mg via ORAL
  Filled 2018-06-01: qty 1

## 2018-06-01 MED ORDER — LOSARTAN POTASSIUM 25 MG PO TABS
25.0000 mg | ORAL_TABLET | Freq: Every day | ORAL | Status: DC
  Administered 2018-06-01 – 2018-06-20 (×19): 25 mg via ORAL
  Filled 2018-06-01 (×20): qty 1

## 2018-06-01 NOTE — Plan of Care (Signed)
  Problem: Education: Goal: Knowledge of General Education information will improve Description Including pain rating scale, medication(s)/side effects and non-pharmacologic comfort measures Outcome: Progressing   Problem: Health Behavior/Discharge Planning: Goal: Ability to manage health-related needs will improve Outcome: Progressing   Problem: Clinical Measurements: Goal: Ability to maintain clinical measurements within normal limits will improve Outcome: Progressing Goal: Will remain free from infection Outcome: Progressing Goal: Diagnostic test results will improve Outcome: Progressing Goal: Respiratory complications will improve Outcome: Progressing Goal: Cardiovascular complication will be avoided Outcome: Progressing   Problem: Activity: Goal: Risk for activity intolerance will decrease Outcome: Progressing   Problem: Nutrition: Goal: Adequate nutrition will be maintained Outcome: Progressing   Problem: Coping: Goal: Level of anxiety will decrease Outcome: Progressing   Problem: Elimination: Goal: Will not experience complications related to bowel motility Outcome: Progressing Goal: Will not experience complications related to urinary retention Outcome: Progressing   Problem: Pain Managment: Goal: General experience of comfort will improve Outcome: Progressing   Problem: Safety: Goal: Ability to remain free from injury will improve Outcome: Progressing   Problem: Skin Integrity: Goal: Risk for impaired skin integrity will decrease Outcome: Progressing   Problem: Education: Goal: Knowledge of the prescribed therapeutic regimen will improve Outcome: Progressing   Problem: Bowel/Gastric: Goal: Gastrointestinal status for postoperative course will improve Outcome: Progressing   Problem: Cardiac: Goal: Ability to maintain an adequate cardiac output will improve Outcome: Progressing   Problem: Respiratory: Goal: Respiratory status will  improve Outcome: Progressing

## 2018-06-01 NOTE — Progress Notes (Signed)
  Subjective: Type B aortic hematoma Pain better, not resolved. Nausea resolved- good appetite CXR with atelectasis, low grade fever- Spirometer and ambulation ordered NSR today Objective: Vital signs in last 24 hours: Temp:  [98.5 F (36.9 C)-100.1 F (37.8 C)] 99 F (37.2 C) (08/10 0800) Pulse Rate:  [60-75] 70 (08/10 0905) Cardiac Rhythm: Normal sinus rhythm (08/10 0900) Resp:  [15-24] 19 (08/10 0900) BP: (109-128)/(43-77) 125/60 (08/10 0904) SpO2:  [89 %-99 %] 98 % (08/10 0900) Weight:  [75.3 kg] 75.3 kg (08/10 0500)  Hemodynamic parameters for last 24 hours:    Intake/Output from previous day: 08/09 0701 - 08/10 0700 In: 120 [P.O.:120] Out: 1675 [Urine:1675] Intake/Output this shift: Total I/O In: 200 [P.O.:200] Out: 400 [Urine:400]       Exam    General- alert and comfortable    Neck- no JVD, no cervical adenopathy palpable, no carotid bruit   Lungs- clear without rales, wheezes   Cor- regular rate and rhythm, no murmur , gallop   Abdomen- soft, non-tender   Extremities - warm, non-tender, minimal edema   Neuro- oriented, appropriate, no focal weakness   Lab Results: Recent Labs    05/31/18 0328 06/01/18 0312  WBC 9.6 9.2  HGB 9.7* 9.1*  HCT 31.7* 30.0*  PLT 263 287   BMET:  Recent Labs    05/30/18 0427 06/01/18 0312  NA 139 136  K 4.0 4.5  CL 101 99  CO2 30 28  GLUCOSE 103* 113*  BUN 5* 7*  CREATININE 0.77 0.96  CALCIUM 8.6* 8.4*    PT/INR: No results for input(s): LABPROT, INR in the last 72 hours. ABG    Component Value Date/Time   TCO2 29 05/28/2018 0419   CBG (last 3)  No results for input(s): GLUCAP in the last 72 hours.  Assessment/Plan: S/P  Mobilize Diuresis BP control  add low dose lolsartan   LOS: 4 days    Tharon Aquas Trigt III 06/01/2018

## 2018-06-01 NOTE — Progress Notes (Signed)
CT surgery p.m. Rounds  Type the aortic hematoma of descending thoracic aorta Patient was up with monitored ambulation Losartan added 25 mg daily with blood pressure less than 110 Some desaturation with activity related to atelectasis, previous left pleural effusion drained with thoracentesis 48 hours ago.  Recheck chest x-ray in a.m. Pain is getting better nausea is resolved and patient had bowel movement.

## 2018-06-02 ENCOUNTER — Inpatient Hospital Stay (HOSPITAL_COMMUNITY): Payer: Medicare Other

## 2018-06-02 LAB — BASIC METABOLIC PANEL
Anion gap: 8 (ref 5–15)
BUN: 8 mg/dL (ref 8–23)
CO2: 31 mmol/L (ref 22–32)
Calcium: 8.8 mg/dL — ABNORMAL LOW (ref 8.9–10.3)
Chloride: 96 mmol/L — ABNORMAL LOW (ref 98–111)
Creatinine, Ser: 0.95 mg/dL (ref 0.44–1.00)
GFR calc Af Amer: >60 mL/min (ref >60)
GFR calc non Af Amer: >60 mL/min (ref >60)
Glucose, Bld: 112 mg/dL — ABNORMAL HIGH (ref 70–99)
Potassium: 4.6 mmol/L (ref 3.5–5.1)
Sodium: 135 mmol/L (ref 135–145)

## 2018-06-02 MED ORDER — PROMETHAZINE HCL 25 MG/ML IJ SOLN
12.5000 mg | Freq: Four times a day (QID) | INTRAMUSCULAR | Status: DC | PRN
  Filled 2018-06-02: qty 1

## 2018-06-02 MED ORDER — SORBITOL 70 % SOLN: 30.0000 mL | Freq: Every day | Status: DC | PRN

## 2018-06-02 MED ORDER — FUROSEMIDE 20 MG PO TABS
20.0000 mg | ORAL_TABLET | Freq: Every day | ORAL | Status: DC
  Administered 2018-06-03 – 2018-06-23 (×20): 20 mg via ORAL
  Filled 2018-06-02 (×20): qty 1

## 2018-06-02 NOTE — Progress Notes (Signed)
  Subjective: Pain better Off O2 CXR today w/o recurrent L effusion  BP < 120 mm Hg  Objective: Vital signs in last 24 hours: Temp:  [97.7 F (36.5 C)-99.3 F (37.4 C)] 98.3 F (36.8 C) (08/11 0855) Pulse Rate:  [40-83] 67 (08/11 0916) Cardiac Rhythm: Normal sinus rhythm (08/10 1930) Resp:  [16-27] 19 (08/11 0643) BP: (99-134)/(51-77) 99/52 (08/11 0918) SpO2:  [89 %-100 %] 96 % (08/11 0643) Weight:  [75.6 kg] 75.6 kg (08/11 0500)  Hemodynamic parameters for last 24 hours:    Intake/Output from previous day: 08/10 0701 - 08/11 0700 In: 760 [P.O.:760] Out: 1100 [Urine:1100] Intake/Output this shift: No intake/output data recorded.       Exam    General- alert and comfortable    Neck- no JVD, no cervical adenopathy palpable, no carotid bruit   Lungs- clear without rales, wheezes   Cor- regular rate and rhythm, no murmur , gallop   Abdomen- soft, non-tender   Extremities - warm, non-tender, minimal edema   Neuro- oriented, appropriate, no focal weakness   Lab Results: Recent Labs    05/31/18 0328 06/01/18 0312  WBC 9.6 9.2  HGB 9.7* 9.1*  HCT 31.7* 30.0*  PLT 263 287   BMET:  Recent Labs    06/01/18 0312 06/02/18 0356  NA 136 135  K 4.5 4.6  CL 99 96*  CO2 28 31  GLUCOSE 113* 112*  BUN 7* 8  CREATININE 0.96 0.95  CALCIUM 8.4* 8.8*    PT/INR: No results for input(s): LABPROT, INR in the last 72 hours. ABG    Component Value Date/Time   TCO2 29 05/28/2018 0419   CBG (last 3)  No results for input(s): GLUCAP in the last 72 hours.  Assessment/Plan: S/P Type B aortic IMH Mobilize BP control  prob transferr to stepdown in am   LOS: 5 days    Tharon Aquas Trigt III 06/02/2018

## 2018-06-02 NOTE — Progress Notes (Signed)
1300: Patient c/o abdominal fullness and bloating around umbilicus area. Also states she is a little nauseated and "just doesn't feel good". Patient states pain in chest/epigastric area is a little worse as well. A/o X 4. VS 109/59 (74), 67 (NSR), 18, and 96% on Rm air. Dr.Van Trigt notified and ordered Phenergan 12.5 mg IV and asked me to give Fentanyl 50 mcg that was already ordered. Patient had several BM last night after receiving bisacodyl, Senna-S, and Sorbitol. She has been urinating approximately 200 ml's every few hours. No odor to urine and patient denies any pain or burning with urination. Encouraged patient to not eat lunch if she didn't feel like it. Once medications was administered she fell asleep. Abdomen appears a little bloated but not distended or rigid. Radial pulses equal and strong. Right arm BP 124/64, left arm BP 109/59. At 1325 Right arm BP 112/54 and left arm BP 105/60. Will continue to monitor closely.

## 2018-06-02 NOTE — Progress Notes (Signed)
CTS   BP stable Abdominal pain probably due to constipation by exam and results of KUB  redose sorbitol in am

## 2018-06-03 ENCOUNTER — Other Ambulatory Visit: Payer: Self-pay | Admitting: *Deleted

## 2018-06-03 ENCOUNTER — Inpatient Hospital Stay (HOSPITAL_COMMUNITY): Payer: Medicare Other

## 2018-06-03 DIAGNOSIS — I7101 Dissection of thoracic aorta: Secondary | ICD-10-CM

## 2018-06-03 DIAGNOSIS — I71019 Dissection of thoracic aorta, unspecified: Secondary | ICD-10-CM

## 2018-06-03 LAB — BASIC METABOLIC PANEL
Anion gap: 10 (ref 5–15)
BUN: 10 mg/dL (ref 8–23)
CO2: 29 mmol/L (ref 22–32)
Calcium: 9 mg/dL (ref 8.9–10.3)
Chloride: 97 mmol/L — ABNORMAL LOW (ref 98–111)
Creatinine, Ser: 1.02 mg/dL — ABNORMAL HIGH (ref 0.44–1.00)
GFR calc Af Amer: >60 mL/min (ref >60)
GFR calc non Af Amer: 56 mL/min — ABNORMAL LOW (ref >60)
Glucose, Bld: 108 mg/dL — ABNORMAL HIGH (ref 70–99)
Potassium: 4.6 mmol/L (ref 3.5–5.1)
Sodium: 136 mmol/L (ref 135–145)

## 2018-06-03 MED ORDER — FENTANYL CITRATE (PF) 100 MCG/2ML IJ SOLN
50.0000 ug | Freq: Four times a day (QID) | INTRAMUSCULAR | Status: DC | PRN
  Administered 2018-06-03 – 2018-06-18 (×12): 50 ug via INTRAVENOUS
  Filled 2018-06-03 (×12): qty 2

## 2018-06-03 NOTE — Progress Notes (Signed)
  Subjective: Feels better Will transfer to stepdown liquid diet until abdominal discomfort better- prob constipation L pleural effusion has resolved on CXR this am Plan repeat CTA mid week then home later in week Objective: Vital signs in last 24 hours: Temp:  [98.1 F (36.7 C)-100.7 F (38.2 C)] 100.7 F (38.2 C) (08/12 0400) Pulse Rate:  [58-103] 67 (08/12 0800) Cardiac Rhythm: Normal sinus rhythm (08/12 0800) Resp:  [12-35] 18 (08/12 0800) BP: (68-124)/(38-98) 113/53 (08/12 0700) SpO2:  [87 %-100 %] 97 % (08/12 0800) Weight:  [74.5 kg] 74.5 kg (08/12 0410)  Hemodynamic parameters for last 24 hours:    Intake/Output from previous day: 08/11 0701 - 08/12 0700 In: 600 [P.O.:600] Out: 2600 [Urine:2600] Intake/Output this shift: Total I/O In: 120 [P.O.:120] Out: -        Exam    General- alert and comfortable    Neck- no JVD, no cervical adenopathy palpable, no carotid bruit   Lungs- clear without rales, wheezes   Cor- regular rate and rhythm, no murmur , gallop   Abdomen- soft, non-tender   Extremities - warm, non-tender, minimal edema   Neuro- oriented, appropriate, no focal weakness   Lab Results: Recent Labs    06/01/18 0312  WBC 9.2  HGB 9.1*  HCT 30.0*  PLT 287   BMET:  Recent Labs    06/02/18 0356 06/03/18 0302  NA 135 136  K 4.6 4.6  CL 96* 97*  CO2 31 29  GLUCOSE 112* 108*  BUN 8 10  CREATININE 0.95 1.02*  CALCIUM 8.8* 9.0    PT/INR: No results for input(s): LABPROT, INR in the last 72 hours. ABG    Component Value Date/Time   TCO2 29 05/28/2018 0419   CBG (last 3)  No results for input(s): GLUCAP in the last 72 hours.  Assessment/Plan: S/P  Medical therapy of type B IMH of descending thoracic aorta tx out of ICU  LOS: 6 days    Samantha Stevenson 06/03/2018

## 2018-06-04 LAB — BASIC METABOLIC PANEL
Anion gap: 11 (ref 5–15)
BUN: 5 mg/dL — ABNORMAL LOW (ref 8–23)
CO2: 29 mmol/L (ref 22–32)
Calcium: 8.9 mg/dL (ref 8.9–10.3)
Chloride: 96 mmol/L — ABNORMAL LOW (ref 98–111)
Creatinine, Ser: 0.96 mg/dL (ref 0.44–1.00)
GFR calc Af Amer: >60 mL/min (ref >60)
GFR calc non Af Amer: >60 mL/min (ref >60)
Glucose, Bld: 107 mg/dL — ABNORMAL HIGH (ref 70–99)
Potassium: 4.5 mmol/L (ref 3.5–5.1)
Sodium: 136 mmol/L (ref 135–145)

## 2018-06-04 LAB — CBC
HCT: 31 % — ABNORMAL LOW (ref 36.0–46.0)
Hemoglobin: 9.6 g/dL — ABNORMAL LOW (ref 12.0–15.0)
MCH: 26.7 pg (ref 26.0–34.0)
MCHC: 31 g/dL (ref 30.0–36.0)
MCV: 86.1 fL (ref 78.0–100.0)
Platelets: 408 10*3/uL — ABNORMAL HIGH (ref 150–400)
RBC: 3.6 MIL/uL — ABNORMAL LOW (ref 3.87–5.11)
RDW: 15.4 % (ref 11.5–15.5)
WBC: 9.5 10*3/uL (ref 4.0–10.5)

## 2018-06-04 MED ORDER — SODIUM CHLORIDE 0.9% FLUSH: 10.0000 mL | INTRAVENOUS | Status: DC | PRN

## 2018-06-04 MED ORDER — SODIUM CHLORIDE 0.9% FLUSH
10.0000 mL | Freq: Two times a day (BID) | INTRAVENOUS | Status: DC
  Administered 2018-06-04 – 2018-06-05 (×2): 10 mL

## 2018-06-04 MED ORDER — BISACODYL 10 MG RE SUPP
10.0000 mg | Freq: Once | RECTAL | Status: AC
  Administered 2018-06-04: 10 mg via RECTAL
  Filled 2018-06-04: qty 1

## 2018-06-04 MED ORDER — LACTULOSE 10 GM/15ML PO SOLN
30.0000 g | Freq: Every day | ORAL | Status: AC
  Administered 2018-06-04: 30 g via ORAL
  Filled 2018-06-04: qty 45

## 2018-06-04 NOTE — Progress Notes (Signed)
      [address]       [place] [address]             [phone]      Pain is better but not completely resolved BP 118/66   Pulse 68   Temp 98.1 F (36.7 C) (Oral)   Resp (!) 29   Ht 5\' 4"  (1.626 m)   Wt 73.4 kg   SpO2 96%   BMI 27.78 kg/m   Intake/Output Summary (Last 24 hours) at 06/04/2018 1859 Last data filed at 06/04/2018 1700 Gross per 24 hour  Intake 720 ml  Output 700 ml  Net 20 ml   + BM- requesting fruit- will advance diet slowly  Remo Lipps C. Roxan Hockey, MD [facility] [phone]

## 2018-06-04 NOTE — Progress Notes (Signed)
  Subjective: Pain better but not resolved abd discomfort from constipation due to narcotics to treat type B aortic intrmural hematoma [ IMH]  Objective: Vital signs in last 24 hours: Temp:  [97.2 F (36.2 C)-99.4 F (37.4 C)] 97.9 F (36.6 C) (08/13 1156) Pulse Rate:  [57-93] 69 (08/13 1400) Cardiac Rhythm: Normal sinus rhythm (08/13 1200) Resp:  [15-34] 22 (08/13 1400) BP: (74-148)/(50-84) 90/79 (08/13 1400) SpO2:  [90 %-100 %] 100 % (08/13 1400) Weight:  [73.4 kg] 73.4 kg (08/13 0600)  Hemodynamic parameters for last 24 hours:  nsr  Intake/Output from previous day: 08/12 0701 - 08/13 0700 In: 1080 [P.O.:1080] Out: 2150 [Urine:2150] Intake/Output this shift: Total I/O In: 240 [P.O.:240] Out: 400 [Urine:400]       Exam    General- alert and comfortable    Neck- no JVD, no cervical adenopathy palpable, no carotid bruit   Lungs- clear without rales, wheezes   Cor- regular rate and rhythm, no murmur , gallop   Abdomen- soft, non-tender   Extremities - warm, non-tender, minimal edema   Neuro- oriented, appropriate, no focal weakness   Lab Results: Recent Labs    06/04/18 0318  WBC 9.5  HGB 9.6*  HCT 31.0*  PLT 408*   BMET:  Recent Labs    06/03/18 0302 06/04/18 0318  NA 136 136  K 4.6 4.5  CL 97* 96*  CO2 29 29  GLUCOSE 108* 107*  BUN 10 5*  CREATININE 1.02* 0.96  CALCIUM 9.0 8.9    PT/INR: No results for input(s): LABPROT, INR in the last 72 hours. ABG    Component Value Date/Time   TCO2 29 05/28/2018 0419   CBG (last 3)  No results for input(s): GLUCAP in the last 72 hours.  Assessment/Plan: S/P  Mobilize BP control  move to stepdown tomorrow Repeat CTA thursday   LOS: 7 days    Tharon Aquas Trigt III 06/04/2018

## 2018-06-04 NOTE — Care Management Note (Signed)
Case Management Note  Patient Details  Name: Samantha Stevenson MRN: [id number] Date of Birth: 1951/08/19  Subjective/Objective:   Pt is s/p AAA repair                 Action/Plan:  PTA completely independent from home.  Pt has family that can provide support if needed at discharge.  Pt has PCP and denied barriers with obtaining/paying for medications.  CM will continue to follow for discharge needs   Expected Discharge Date:                  Expected Discharge Plan:  Home/Self Care  In-House Referral:     Discharge planning Services  CM Consult  Post Acute Care Choice:    Choice offered to:     DME Arranged:    DME Agency:     HH Arranged:    HH Agency:     Status of Service:  In process, will continue to follow  If discussed at Long Length of Stay Meetings, dates discussed:    Additional Comments:  Maryclare Labrador, RN 06/04/2018, 10:16 AM

## 2018-06-04 NOTE — Progress Notes (Signed)
Patient has had 6 PIV's since admission date 05/28/18 current PIV sites have infiltrated. I have spoke with the RN Jacqlyn Larsen to make her aware of the situation and that a central line of some sort will be more beneficial to the patient at this time.

## 2018-06-05 MED ORDER — ALPRAZOLAM 0.5 MG PO TABS
0.5000 mg | ORAL_TABLET | Freq: Two times a day (BID) | ORAL | Status: DC
  Filled 2018-06-05: qty 1

## 2018-06-05 MED ORDER — FUROSEMIDE 10 MG/ML IJ SOLN
20.0000 mg | Freq: Once | INTRAMUSCULAR | Status: AC
  Administered 2018-06-05: 20 mg via INTRAVENOUS

## 2018-06-05 MED ORDER — FUROSEMIDE 10 MG/ML IJ SOLN
INTRAMUSCULAR | Status: AC
  Administered 2018-06-05: 20 mg via INTRAVENOUS
  Filled 2018-06-05: qty 2

## 2018-06-05 NOTE — Progress Notes (Signed)
  Subjective: States she had a poor day with sob althogh O2 sat 98% and am CXR w/o edema or effusion BP well controlled and HR 65 Objective: Vital signs in last 24 hours: Temp:  [97.8 F (36.6 C)-99.1 F (37.3 C)] 97.8 F (36.6 C) (08/14 1711) Pulse Rate:  [61-87] 73 (08/14 1800) Cardiac Rhythm: Normal sinus rhythm (08/14 1600) Resp:  [13-27] 19 (08/14 1800) BP: (85-123)/(48-82) 100/51 (08/14 1800) SpO2:  [91 %-100 %] 98 % (08/14 1800) Weight:  [73 kg] 73 kg (08/14 0600)  Hemodynamic parameters for last 24 hours:    Intake/Output from previous day: 08/13 0701 - 08/14 0700 In: 360 [P.O.:360] Out: 875 [Urine:875] Intake/Output this shift: Total I/O In: 480 [P.O.:480] Out: 1200 [Urine:1200]       Exam    General- alert and comfortable    Neck- no JVD, no cervical adenopathy palpable, no carotid bruit   Lungs- clear without rales, wheezes   Cor- regular rate and rhythm, no murmur , gallop   Abdomen- soft, non-tender   Extremities - warm, non-tender, 2+ edema   Neuro- oriented, appropriate, no focal weakness   Lab Results: Recent Labs    06/04/18 0318  WBC 9.5  HGB 9.6*  HCT 31.0*  PLT 408*   BMET:  Recent Labs    06/03/18 0302 06/04/18 0318  NA 136 136  K 4.6 4.5  CL 97* 96*  CO2 29 29  GLUCOSE 108* 107*  BUN 10 5*  CREATININE 1.02* 0.96  CALCIUM 9.0 8.9    PT/INR: No results for input(s): LABPROT, INR in the last 72 hours. ABG    Component Value Date/Time   TCO2 29 05/28/2018 0419   CBG (last 3)  No results for input(s): GLUCAP in the last 72 hours.  Assessment/Plan: S/P  Medical Rx of type B IMH Cont medical therapy, repeat CTA in am   LOS: 8 days    Tharon Aquas Trigt III 06/05/2018

## 2018-06-06 ENCOUNTER — Inpatient Hospital Stay (HOSPITAL_COMMUNITY): Payer: Medicare Other

## 2018-06-06 DIAGNOSIS — I712 Thoracic aortic aneurysm, without rupture: Secondary | ICD-10-CM

## 2018-06-06 DIAGNOSIS — I7100 Dissection of unspecified site of aorta: Secondary | ICD-10-CM

## 2018-06-06 DIAGNOSIS — R609 Edema, unspecified: Secondary | ICD-10-CM

## 2018-06-06 MED ORDER — IOPAMIDOL (ISOVUE-370) INJECTION 76%
100.0000 mL | Freq: Once | INTRAVENOUS | Status: AC | PRN
  Administered 2018-06-06: 100 mL via INTRAVENOUS

## 2018-06-06 MED ORDER — ADULT MULTIVITAMIN W/MINERALS CH
1.0000 | ORAL_TABLET | Freq: Every day | ORAL | Status: DC
  Administered 2018-06-06 – 2018-06-12 (×7): 1 via ORAL
  Filled 2018-06-06 (×8): qty 1

## 2018-06-06 MED ORDER — IOPAMIDOL (ISOVUE-370) INJECTION 76%
INTRAVENOUS | Status: AC
  Filled 2018-06-06: qty 100

## 2018-06-06 NOTE — Progress Notes (Signed)
Initial Nutrition Assessment  DOCUMENTATION CODES:   Obesity unspecified  INTERVENTION:   -MVI with minerals daily -Obtained lunch order and food preferences; RD personally obtained lunch meal order for pt and entered into meal delivery system -If poor oral intake improves, consider liberalization of diet  NUTRITION DIAGNOSIS:   Inadequate oral intake related to decreased appetite as evidenced by meal completion < 25%, per patient/family report.  GOAL:   Patient will meet greater than or equal to 90% of their needs  MONITOR:   PO intake, Supplement acceptance, Labs, Weight trends, Skin, I & O's  REASON FOR ASSESSMENT:   Consult Assessment of nutrition requirement/status, Poor PO  ASSESSMENT:   67 year old hypertensive nondiabetic female presents with sudden onset of epigastric and back pain.  CTA performed in the ED demonstrated a thoracic aortic intramural hematoma extending from the left subclavian artery to the visceral vessels.  She has had associated nausea and vomiting.   Pt admitted with intramural hematoma of descending thoracic aorta with back and epigastric pain.   8/9- s/p lt thoracentesis (400 ml fluid removed) due to pleural effusion; no pneumothorax on CXR 8/15- transferred from ICU to PCU, for CT of abdomen today  MD consulted RD for poor oral intake and food preferences.   Spoke with pt at bedside, who has just returned from radiology at time of visit (pt prefers to be addressed as Samantha Stevenson). Pt very upset at time of visit ("this day has not been going well for me- it all started when they messed up my breakfast'). RD actively listened to pt concerns. Pt reports that she had a great appetite PTA 9"I ate whenever and whatever I wanted"). She denies any recent wt loss, but reports that she was "trying to look firmer". She suspects that she has lost weight during hospitalization due to poor oral intake; pt shares complains of early satiety related to acute  medical issues as well as errors on meal trays (documented meal completion 5-50%).   Per wt hx, pt has experienced 6.7% wt loss over the past 3 months. While not significant for time frame, this is concerning when coupled with poor oral intake.  Pt declined offer of oral nutrition supplements at this time. RD took pt lunch order and personally entered into meal ordering system. RD also offered to obtain future orders for pt, but she politely declined. Reinforced with pt how to use menu, meal selections, and how to call down for order. Also discussed importance of good meal intake to promote healing.   Labs reviewed.   NUTRITION - FOCUSED PHYSICAL EXAM:    Most Recent Value  Orbital Region  No depletion  Upper Arm Region  No depletion  Thoracic and Lumbar Region  No depletion  Buccal Region  No depletion  Temple Region  No depletion  Clavicle Bone Region  No depletion  Clavicle and Acromion Bone Region  No depletion  Scapular Bone Region  No depletion  Dorsal Hand  No depletion  Patellar Region  No depletion  Anterior Thigh Region  No depletion  Posterior Calf Region  No depletion  Edema (RD Assessment)  None  Hair  Reviewed  Eyes  Reviewed  Mouth  Reviewed  Skin  Reviewed  Nails  Reviewed       Diet Order:   Diet Order            Diet Heart Room service appropriate? Yes; Fluid consistency: Thin  Diet effective now  EDUCATION NEEDS:   Education needs have been addressed  Skin:  Skin Assessment: Reviewed RN Assessment  Last BM:  06/05/18  Height:   Ht Readings from Last 1 Encounters:  06/06/18 5\' 4"  (1.626 m)    Weight:   Wt Readings from Last 1 Encounters:  06/06/18 72.7 kg    Ideal Body Weight:  54.5 kg  BMI:  Body mass index is 27.52 kg/m.  Estimated Nutritional Needs:   Kcal:  1800-2000  Protein:  90-105 grams  Fluid:  1.8-2.0 L    Kadience Macchi A. Jimmye Norman, RD, LDN, CDE Pager: [phone] After hours Pager: [phone]

## 2018-06-06 NOTE — Consult Note (Addendum)
Hospital Consult    Reason for Consult:  Intramural aortic hematoma Requesting Physician:  Nils Pyle MRN #:  [id number]  History of Present Illness: This is a 67 y.o. female who states about 1-2 weeks ago, she was at her daughter's house using the bathroom and developed a sudden pain in her chest.  She decided to go home, change clothes and lay down, however, it didn't get any better and she was taken to the emergency room at [facility]. She has hx of DVT/PE and felt like she might be having another PE.  She underwent CTA and was found to have an acute intramural hematoma of the thoracic aorta extending from the left subclavian artery to the renal artery origins.  She was admitted to the hospital for strict BP control.  Her Xarelto was reversed.   She had a repeat CTA today and it has not improved.  She continues to have chest and back pain that has improved since admission, but is still painful for her.    The pt is on a statin for cholesterol management.  She has been placed on an ARB and CCB for BP control.     In 2014, she had a left hip replacement and subsequently developed a DVT in the left leg as well as a pulmonary embolus.  She says that her left leg stays more swollen than the right.   She did have a venous duplex this admission that was negative for DVT.  She has normal renal function  She has never smoked or used alcohol.    She has never had surgery on her groins or neck.  She has a hx of hysterectomy and thumb surgery.  Past Medical History:  Diagnosis Date  . Anemia   . Arthritis   . Atrial fibrillation ([place])   . Degenerative arthritis of hip    s/p L THR 12/2012  . Dyslipidemia   . Hyperlipidemia   . Hypertension   . Hypertension   . Migraines   . Myocardial infarction ([place]) 2000   due to atrial fib  . Pulmonary emboli ([place]) 12/2012 dx   post op (L THR), anticoag x 69mo    Past Surgical History:  Procedure Laterality Date  . Breast Ultrasound Left 04/16/13   Done @  breast center Impression: no malignancy appearance noted on the screen study is consistent with a summation shadow  . DG TUMB RIGHT HAND Left    cyst removal  . IR THORACENTESIS ASP PLEURAL SPACE W/IMG GUIDE  05/31/2018  . PARTIAL HYSTERECTOMY    . TONSILLECTOMY    . TOTAL HIP ARTHROPLASTY Left 01/03/2013   Procedure: TOTAL HIP ARTHROPLASTY ANTERIOR APPROACH;  Surgeon: Mcarthur Rossetti, MD;  Location: [facility] ORS;  Service: Orthopedics;  Laterality: Left;  Left Total Hip Arthroplasty, Anterior Approach  . VAGINA RECONSTRUCTION SURGERY     vagina had closed up when 67 years old    Allergies  Allergen Reactions  . Latex Hives and Rash  . Penicillins Other (See Comments)    Severe swelling Has patient had a PCN reaction causing immediate rash, facial/tongue/throat swelling, SOB or lightheadedness with hypotension: Yes Has patient had a PCN reaction causing severe rash involving mucus membranes or skin necrosis: No Has patient had a PCN reaction that required hospitalization: Yes Has patient had a PCN reaction occurring within the last 10 years: No If all of the above answers are "NO", then may proceed with Cephalosporin use.  Diona Fanti [Aspirin] Other (See Comments)  Makes stomach upset  . Celebrex [Celecoxib]     GI upset  . Nsaids     rash    Prior to Admission medications   Medication Sig Start Date End Date Taking? Authorizing Provider  acetaminophen (TYLENOL) 500 MG tablet Take 1,000 mg by mouth as needed for moderate pain.    Yes [provider]  DIGITEK 250 MCG tablet TAKE 1 TABLET BY MOUTH EVERY MORNING 04/02/18  Yes Hoyt Koch, MD  DILT-XR 240 MG 24 hr capsule TAKE 1 CAPSULE BY MOUTH EVERY MORNING 03/06/18  Yes Hoyt Koch, MD  ferrous sulfate 325 (65 FE) MG tablet Take 325 mg by mouth once a week.  01/05/13  Yes Pete Pelt, PA-C  fluticasone (FLONASE) 50 MCG/ACT nasal spray Place 2 sprays into both nostrils daily. 03/29/18  Yes Marrian Salvage, FNP  furosemide (LASIX) 20 MG tablet Take 20 mg by mouth daily as needed for fluid.    Yes [provider]  Multiple Vitamin (MULTIVITAMIN) tablet Take 1 tablet by mouth daily.   Yes [provider]  rivaroxaban (XARELTO) 20 MG TABS tablet Take 1 tablet (20 mg total) by mouth daily with supper. 02/27/18  Yes Imogene Burn, PA-C  rosuvastatin (CRESTOR) 20 MG tablet TAKE 1 TABLET BY MOUTH EVERY DAY 04/02/18  Yes Hoyt Koch, MD  doxycycline (VIBRA-TABS) 100 MG tablet Take 1 tablet (100 mg total) by mouth 2 (two) times daily. Patient not taking: Reported on 05/28/2018 03/29/18   Marrian Salvage, [place]    Social History   Socioeconomic History  . Marital status: Single    Spouse name: Not on file  . Number of children: 2  . Years of education: Not on file  . Highest education level: Not on file  Occupational History  . Not on file  Social Needs  . Financial resource strain: Not very hard  . Food insecurity:    Worry: Sometimes true    Inability: Sometimes true  . Transportation needs:    Medical: No    Non-medical: No  Tobacco Use  . Smoking status: Never Smoker  . Smokeless tobacco: Never Used  Substance and Sexual Activity  . Alcohol use: No  . Drug use: No  . Sexual activity: Not Currently  Lifestyle  . Physical activity:    Days per week: 4 days    Minutes per session: 60 min  . Stress: To some extent  Relationships  . Social connections:    Talks on phone: More than three times a week    Gets together: More than three times a week    Attends religious service: More than 4 times per year    Active member of club or organization: Yes    Attends meetings of clubs or organizations: 1 to 4 times per year    Relationship status: Not on file  . Intimate partner violence:    Fear of current or ex partner: Not on file    Emotionally abused: Not on file    Physically abused: Not on file    Forced sexual activity: Not on file  Other  Topics Concern  . Not on file  Social History Narrative  . Not on file     Family History  Problem Relation Age of Onset  . Heart disease Father   . Diabetes Mother   . Heart disease Mother        pacemaker  . Heart attack Mother 35  .  Diabetes Sister   . Alcohol abuse Other   . Heart disease Other   . Hyperlipidemia Other   . Hypertension Other   . Diabetes Other   . Breast cancer Other     ROS: [x]  Positive   [ ]  Negative   [ ]  All sytems reviewed and are negative  Cardiac: [x]  chest pain/pressure []  palpitations []  SOB lying flat []  DOE [x]  hx afib  Vascular: []  pain in legs while walking []  pain in legs at rest []  pain in legs at night []  non-healing ulcers [x]  hx of DVT/PE after hip surgery 2014 [x]  swelling in legs-chronic left leg swelling  Pulmonary: []  productive cough []  asthma/wheezing []  home O2  Neurologic: []  weakness in []  arms []  legs []  numbness in []  arms []  legs []  hx of CVA []  mini stroke [] difficulty speaking or slurred speech []  temporary loss of vision in one eye []  dizziness  Hematologic: []  hx of cancer []  bleeding problems []  problems with blood clotting easily  Endocrine:   []  diabetes []  thyroid disease  GI []  vomiting blood []  blood in stool  GU: []  CKD/renal failure []  HD--[]  M/W/F or []  T/T/S []  burning with urination []  blood in urine  Psychiatric: []  anxiety []  depression  Musculoskeletal: [x]  arthritis []  joint pain  Integumentary: []  rashes []  ulcers  Constitutional: []  fever []  chills   Physical Examination  Vitals:   06/06/18 1014 06/06/18 1200  BP: 109/60 (!) 106/57  Pulse: 88 78  Resp: 19 (!) 22  Temp: 97.7 F (36.5 C) 98 F (36.7 C)  SpO2: 99% 95%   Body mass index is 27.52 kg/m.  General:  WDWN in NAD Gait: Not observed HENT: WNL, normocephalic Pulmonary: normal non-labored breathing, without Rales, rhonchi,  wheezing Cardiac: regular, without  Murmurs, rubs or gallops;  without carotid bruits Abdomen:  soft, NT/ND, no masses Skin: without rashes Vascular Exam/Pulses:  Right Left  Radial 2+ (normal) 2+ (normal)  Ulnar 1+ (weak) 1+ (weak)  Femoral 2+ (normal) 2+ (normal)  Popliteal Unable to palpate  Unable to palpate   DP 2+ (normal) 2+ (normal)  PT Unable to palpate  Unable to palpate    Extremities: without ischemic changes, without Gangrene , without cellulitis; without open wounds;  Musculoskeletal: no muscle wasting or atrophy  Neurologic: A&O X 3;  No focal weakness or paresthesias are detected; speech is fluent/normal Psychiatric:  The pt has Normal affect.  CBC    Component Value Date/Time   WBC 9.5 06/04/2018 0318   RBC 3.60 (L) 06/04/2018 0318   HGB 9.6 (L) 06/04/2018 0318   HGB 10.1 (L) 03/12/2018 1358   HCT 31.0 (L) 06/04/2018 0318   HCT 32.5 (L) 03/12/2018 1358   PLT 408 (H) 06/04/2018 0318   PLT 317 03/12/2018 1358   MCV 86.1 06/04/2018 0318   MCV 86 03/12/2018 1358   MCH 26.7 06/04/2018 0318   MCHC 31.0 06/04/2018 0318   RDW 15.4 06/04/2018 0318   RDW 15.9 (H) 03/12/2018 1358   LYMPHSABS 1.2 05/28/2018 0417   MONOABS 0.5 05/28/2018 0417   EOSABS 0.0 05/28/2018 0417   BASOSABS 0.0 05/28/2018 0417    BMET    Component Value Date/Time   NA 136 06/04/2018 0318   NA 141 03/12/2018 1358   K 4.5 06/04/2018 0318   CL 96 (L) 06/04/2018 0318   CO2 29 06/04/2018 0318   GLUCOSE 107 (H) 06/04/2018 0318   BUN 5 (L) 06/04/2018 0318   BUN  13 03/12/2018 1358   CREATININE 0.96 06/04/2018 0318   CALCIUM 8.9 06/04/2018 0318   GFRNONAA >60 06/04/2018 0318   GFRAA >60 06/04/2018 0318    COAGS: Lab Results  Component Value Date   INR 1.21 05/29/2018   INR 1.11 01/10/2013   INR 1.01 12/31/2012     Non-Invasive Vascular Imaging:   CTA chest/abdomen/pelvis 06/06/18 IMPRESSION: Type B intramural hematoma is again noted extending from left subclavian artery to the proximal abdominal aorta to the level of the renal  arteries. Increased aneurysmal dilatation of descending thoracic aorta is now noted with maximum measured diameter 4.3 cm. There is also interval development of small intramural blood pool involving medial portion of the descending thoracic aorta measuring 9 x 6 mm. There is also the interval development of small left pleural effusion. Maximum hematoma thickness of 15 mm is noted. These findings suggest increased risk of morbidity involving the intramural hematoma.  Mild pancreatic ductal dilatation is noted. Follow-up with CT scan or ultrasound in 6 months is recommended to ensure stability.  Venous duplex 06/06/18: Bilateral lower extremity venous duplex has been completed. Negative for DVT.   Statin:  Yes.   Beta Blocker:  No. Aspirin:  No. ACEI:  No. ARB:  Yes.   CCB use:  Yes Other antiplatelets/anticoagulants:  No. (Xarelto has been discontinued)    ASSESSMENT/PLAN: This is a 67 y.o. female with acute type B aortic intramural hematoma   -pt's blood pressure is now well controlled.  She continues to have chest and back pain.  She had a repeat CTA today that revealed no improvement in her aorta.   -Dr. Donzetta Matters feels the pt needs surgical repair with endovascular stent graft.  She does have a bovine arch and will most likely need a subclavian carotid bypass at the time of surgery.  He discussed this with the pt and she is in agreement.  Will plan for next week.  -her Xarelto was reversed upon admission.    Leontine Locket, PA-C [facility] [phone]    I have interviewed and examined patient with PA and agree with assessment and plan above. She has what appears to be IMH likely originating just distal to the left subclavian artery.  She has a bovine arch.  Would likely need subclavian transposition versus carotid subclavian bypass but to gain approximately 1 cm of distance for thoracic endograft.  We will evaluate over the weekend for persistent pain and if  she is not better we will plan above-noted procedure next week.  Eean Buss C. Donzetta Matters, MD [facility] [place] Office: [phone] Pager: [phone]

## 2018-06-06 NOTE — Progress Notes (Signed)
Transported patient to 2C-05 via w/c. Patient A/O X 4 and in stable condition. All personal belongings taken. Report given to Yvone Neu, Therapist, sports.

## 2018-06-06 NOTE — Progress Notes (Signed)
Bilateral lower extremity venous duplex has been completed. Negative for DVT.  06/06/18 11:51 AM Carlos Levering RVT

## 2018-06-06 NOTE — Plan of Care (Signed)
  Problem: Education: Goal: Knowledge of General Education information will improve Description Including pain rating scale, medication(s)/side effects and non-pharmacologic comfort measures Outcome: Progressing   Problem: Health Behavior/Discharge Planning: Goal: Ability to manage health-related needs will improve Outcome: Progressing   Problem: Clinical Measurements: Goal: Ability to maintain clinical measurements within normal limits will improve Outcome: Progressing Goal: Respiratory complications will improve Outcome: Progressing Goal: Cardiovascular complication will be avoided Outcome: Progressing   Problem: Activity: Goal: Risk for activity intolerance will decrease Outcome: Progressing   Problem: Nutrition: Goal: Adequate nutrition will be maintained Outcome: Progressing   Problem: Coping: Goal: Level of anxiety will decrease Outcome: Progressing   Problem: Elimination: Goal: Will not experience complications related to bowel motility Outcome: Progressing Goal: Will not experience complications related to urinary retention Outcome: Progressing   Problem: Safety: Goal: Ability to remain free from injury will improve Outcome: Progressing   Problem: Skin Integrity: Goal: Risk for impaired skin integrity will decrease Outcome: Progressing   Problem: Education: Goal: Knowledge of the prescribed therapeutic regimen will improve Outcome: Progressing   Problem: Cardiac: Goal: Ability to maintain an adequate cardiac output will improve Outcome: Progressing   Problem: Respiratory: Goal: Respiratory status will improve Outcome: Progressing   Problem: Bowel/Gastric: Goal: Gastrointestinal status for postoperative course will improve Outcome: Not Progressing

## 2018-06-06 NOTE — Progress Notes (Signed)
Patient arrived in room 2C05 at 1005 hours and immediately taken by transporter, via wheelchair, to CT scan.

## 2018-06-06 NOTE — Progress Notes (Signed)
  Subjective: Patient continues with persistent chest back and epigastric discomfort- almost 2 weeks- from type B intramural hematoma of thoracic aorta despite excellent blood pressure control and avoidance of anticoagulation.  CT scan performed today personally reviewed showing slight increase in the diameter of the intramural hematoma in the total aortic size.  Appreciate the evaluation by the[facility]-Dr. Donzetta Matters.  I discussed the patient's condition with Dr. Donzetta Matters for coordination of care and agree that TEVAR repair of the descending thoracic aorta with simultaneous carotid subclavian bypass would provide the best long-term benefit for this patient.  Objective: Vital signs in last 24 hours: Temp:  [97.7 F (36.5 C)-99.8 F (37.7 C)] 98.7 F (37.1 C) (08/15 1600) Pulse Rate:  [64-89] 77 (08/15 1600) Cardiac Rhythm: Normal sinus rhythm (08/15 1600) Resp:  [13-26] 20 (08/15 1600) BP: (90-112)/(45-81) 104/56 (08/15 1600) SpO2:  [93 %-100 %] 98 % (08/15 1600) Weight:  [72.7 kg] 72.7 kg (08/15 0600)  Hemodynamic parameters for last 24 hours:  Sinus rhythm  Intake/Output from previous day: 08/14 0701 - 08/15 0700 In: 1080 [P.O.:1080] Out: 2250 [Urine:2250] Intake/Output this shift: Total I/O In: 1080 [P.O.:1080] Out: 300 [Urine:300] Lungs clear Abdomen soft Normal sinus rhythm No murmur   Lab Results: Recent Labs    06/04/18 0318  WBC 9.5  HGB 9.6*  HCT 31.0*  PLT 408*   BMET:  Recent Labs    06/04/18 0318  NA 136  K 4.5  CL 96*  CO2 29  GLUCOSE 107*  BUN 5*  CREATININE 0.96  CALCIUM 8.9    PT/INR: No results for input(s): LABPROT, INR in the last 72 hours. ABG    Component Value Date/Time   TCO2 29 05/28/2018 0419   CBG (last 3)  No results for input(s): GLUCAP in the last 72 hours.  Assessment/Plan: S/P Type B descending thoracic aortic intramural hematoma  Continue medical therapy with probable endovascular stent reconstruction next week by Dr. Donzetta Matters    LOS: 9 days    Tharon Aquas Trigt III 06/06/2018

## 2018-06-06 NOTE — Progress Notes (Signed)
Patient returned to room 2C05 from CT by transporter via wheelchair.

## 2018-06-07 NOTE — Plan of Care (Signed)
  Problem: Education: Goal: Knowledge of General Education information will improve Description Including pain rating scale, medication(s)/side effects and non-pharmacologic comfort measures Outcome: Progressing   Problem: Health Behavior/Discharge Planning: Goal: Ability to manage health-related needs will improve Outcome: Progressing   Problem: Clinical Measurements: Goal: Ability to maintain clinical measurements within normal limits will improve Outcome: Progressing Goal: Diagnostic test results will improve Outcome: Progressing Goal: Respiratory complications will improve Outcome: Progressing Goal: Cardiovascular complication will be avoided Outcome: Progressing   Problem: Activity: Goal: Risk for activity intolerance will decrease Outcome: Progressing   Problem: Nutrition: Goal: Adequate nutrition will be maintained Outcome: Progressing   Problem: Coping: Goal: Level of anxiety will decrease Outcome: Progressing   Problem: Elimination: Goal: Will not experience complications related to urinary retention Outcome: Progressing   Problem: Pain Managment: Goal: General experience of comfort will improve Outcome: Progressing   Problem: Safety: Goal: Ability to remain free from injury will improve Outcome: Progressing   Problem: Skin Integrity: Goal: Risk for impaired skin integrity will decrease Outcome: Progressing   Problem: Elimination: Goal: Will not experience complications related to bowel motility Outcome: Not Progressing

## 2018-06-07 NOTE — Progress Notes (Addendum)
      [address]       [place] [address]             [phone]          Procedure(s) (LRB): THORACIC AORTIC ENDOVASCULAR STENT GRAFT (N/A) BYPASS GRAFT CAROTID-SUBCLAVIAN (Left)  Subjective: Patient still with chest and back pain  Objective: Vital signs in last 24 hours: Temp:  [98 F (36.7 C)-98.9 F (37.2 C)] 98.6 F (37 C) (08/16 0756) Pulse Rate:  [70-88] 88 (08/16 0756) Cardiac Rhythm: Normal sinus rhythm (08/16 0800) Resp:  [20-22] 20 (08/16 0756) BP: (104-129)/(56-70) 114/58 (08/16 0756) SpO2:  [95 %-98 %] 98 % (08/16 0329) Weight:  [71.6 kg] 71.6 kg (08/16 0624)   Current Weight  06/07/18 71.6 kg       Intake/Output from previous day: 08/15 0701 - 08/16 0700 In: 1320 [P.O.:1320] Out: 300 [Urine:300]   Physical Exam:  Cardiovascular: RRR Pulmonary: Clear to auscultation bilaterally Abdomen: Soft, non tender, bowel sounds present. Extremities: Trace bilateral lower extremity edema. Feet warm   Lab Results: CBC:No results for input(s): WBC, HGB, HCT, PLT in the last 72 hours. BMET: No results for input(s): NA, K, CL, CO2, GLUCOSE, BUN, CREATININE, CALCIUM in the last 72 hours.  PT/INR:  Lab Results  Component Value Date   INR 1.21 05/29/2018   INR 1.11 01/10/2013   INR 1.01 12/31/2012   ABG:  INR: Will add last result for INR, ABG once components are confirmed Will add last 4 CBG results once components are confirmed  Assessment/Plan:  1. CV - Results of CT angio of chest/abdomen/pelvis noted. Type B intramural hematoma of thoracic aorta. Dr. Donzetta Matters to do TEVAR along with simultaneous carotid subclavian bypass or transposition next week. BP under good control. On Digoxin 0.25 mg daily, Diltiazem 240 mg daily, and Losartan 25 mg daily. 2.  Pulmonary - On room air. 3.    Donielle M ZimmermanPA-C 06/07/2018,10:44 AM [phone]   I feel that TEVAR will benefit the patient who still has sig pain after 2 weeks of  optimal medical therapy and with CT evidence on some increase in the aortic IMH diameter.No DVT by duplex US. Cont to hopd Xarelto  patient examined and medical record reviewed,agree with above note. Tharon Aquas Trigt III 06/07/2018

## 2018-06-07 NOTE — Care Management Important Message (Signed)
Important Message  Patient Details  Name: Samantha Stevenson MRN: [id number] Date of Birth: 1951/07/25   Medicare Important Message Given:  Yes    Harm Jou P Kittrell 06/07/2018, 4:05 PM

## 2018-06-07 NOTE — Care Management Note (Signed)
Case Management Note  Patient Details  Name: Samantha Stevenson MRN: [id number] Date of Birth: 12/13/1950  Subjective/Objective:   Pt is s/p AAA repair                 Action/Plan:  PTA completely independent from home.  Pt has family that can provide support if needed at discharge.  Pt has PCP and denied barriers with obtaining/paying for medications.  CM will continue to follow for discharge needs   Expected Discharge Date:                  Expected Discharge Plan:  Home/Self Care  In-House Referral:     Discharge planning Services  CM Consult  Post Acute Care Choice:    Choice offered to:     DME Arranged:    DME Agency:     HH Arranged:    HH Agency:     Status of Service:  In process, will continue to follow  If discussed at Long Length of Stay Meetings, dates discussed:    Additional Comments: 06/07/2018 TAVR scheduled for next week Maryclare Labrador, RN 06/07/2018, 1:24 PM

## 2018-06-07 NOTE — Progress Notes (Signed)
  Progress Note    06/07/2018 7:05 AM * No surgery found *  Subjective: Still has pain in chest that is moderate  Vitals:   06/06/18 2339 06/07/18 0329  BP: 111/60 (!) 105/58  Pulse: 70 72  Resp: (!) 21 20  Temp: 98.5 F (36.9 C) 98.7 F (37.1 C)  SpO2: 98% 98%    Physical Exam: Awake alert oriented Nonlabored respirations Palpable radial and femoral pulses bilaterally  CBC    Component Value Date/Time   WBC 9.5 06/04/2018 0318   RBC 3.60 (L) 06/04/2018 0318   HGB 9.6 (L) 06/04/2018 0318   HGB 10.1 (L) 03/12/2018 1358   HCT 31.0 (L) 06/04/2018 0318   HCT 32.5 (L) 03/12/2018 1358   PLT 408 (H) 06/04/2018 0318   PLT 317 03/12/2018 1358   MCV 86.1 06/04/2018 0318   MCV 86 03/12/2018 1358   MCH 26.7 06/04/2018 0318   MCHC 31.0 06/04/2018 0318   RDW 15.4 06/04/2018 0318   RDW 15.9 (H) 03/12/2018 1358   LYMPHSABS 1.2 05/28/2018 0417   MONOABS 0.5 05/28/2018 0417   EOSABS 0.0 05/28/2018 0417   BASOSABS 0.0 05/28/2018 0417    BMET    Component Value Date/Time   NA 136 06/04/2018 0318   NA 141 03/12/2018 1358   K 4.5 06/04/2018 0318   CL 96 (L) 06/04/2018 0318   CO2 29 06/04/2018 0318   GLUCOSE 107 (H) 06/04/2018 0318   BUN 5 (L) 06/04/2018 0318   BUN 13 03/12/2018 1358   CREATININE 0.96 06/04/2018 0318   CALCIUM 8.9 06/04/2018 0318   GFRNONAA >60 06/04/2018 0318   GFRAA >60 06/04/2018 0318    INR    Component Value Date/Time   INR 1.21 05/29/2018 0247     Intake/Output Summary (Last 24 hours) at 06/07/2018 0705 Last data filed at 06/06/2018 2020 Gross per 24 hour  Intake 1320 ml  Output 300 ml  Net 1020 ml     Assessment/plan:  67 y.o. female is here with descending thoracic intramural hematoma.  She has persistent pain in her chest.  I discussed with her the reason to proceed with surgery which would be ongoing pain and we will continue to monitor over the weekend.  She still has pain I think it is reasonable to proceed and she will need left  subclavian revascularization either with bypass or transposition.  I discussed with her the risk of stroke and/or spinal cord ischemia and she will need a spinal drain placed prior to procedure.  She demonstrates good understanding and I will reevaluate Monday for possible procedure Tuesday or Wednesday.  My partner Dr. Carlis Abbott is here this weekend should she need urgent care.    Stepan Verrette C. Donzetta Matters, MD [facility] Office: [phone] Pager: [phone]  06/07/2018 7:05 AM

## 2018-06-08 LAB — BASIC METABOLIC PANEL
Anion gap: 8 (ref 5–15)
BUN: 13 mg/dL (ref 8–23)
CO2: 29 mmol/L (ref 22–32)
Calcium: 8.6 mg/dL — ABNORMAL LOW (ref 8.9–10.3)
Chloride: 97 mmol/L — ABNORMAL LOW (ref 98–111)
Creatinine, Ser: 0.98 mg/dL (ref 0.44–1.00)
GFR calc Af Amer: >60 mL/min (ref >60)
GFR calc non Af Amer: 59 mL/min — ABNORMAL LOW (ref >60)
Glucose, Bld: 103 mg/dL — ABNORMAL HIGH (ref 70–99)
Potassium: 4.4 mmol/L (ref 3.5–5.1)
Sodium: 134 mmol/L — ABNORMAL LOW (ref 135–145)

## 2018-06-08 LAB — CBC
HCT: 30.1 % — ABNORMAL LOW (ref 36.0–46.0)
Hemoglobin: 9.4 g/dL — ABNORMAL LOW (ref 12.0–15.0)
MCH: 27.1 pg (ref 26.0–34.0)
MCHC: 31.2 g/dL (ref 30.0–36.0)
MCV: 86.7 fL (ref 78.0–100.0)
Platelets: 474 10*3/uL — ABNORMAL HIGH (ref 150–400)
RBC: 3.47 MIL/uL — ABNORMAL LOW (ref 3.87–5.11)
RDW: 15.3 % (ref 11.5–15.5)
WBC: 8.6 10*3/uL (ref 4.0–10.5)

## 2018-06-08 NOTE — Progress Notes (Addendum)
[address]       [place] [address]             [phone]        Procedure(s) (LRB): THORACIC AORTIC ENDOVASCULAR STENT GRAFT (N/A) BYPASS GRAFT CAROTID-SUBCLAVIAN (Left) Subjective: Feels well, no new c/o  Objective: Vital signs in last 24 hours: Temp:  [98 F (36.7 C)-100 F (37.8 C)] 98 F (36.7 C) (08/17 0759) Pulse Rate:  [65-81] 81 (08/17 0957) Cardiac Rhythm: Normal sinus rhythm (08/17 0700) Resp:  [21-27] 21 (08/17 0759) BP: (98-115)/(51-66) 98/62 (08/17 0957) SpO2:  [98 %-100 %] 98 % (08/17 0308) Weight:  [71.8 kg] 71.8 kg (08/17 0615)  Hemodynamic parameters for last 24 hours:    Intake/Output from previous day: 08/16 0701 - 08/17 0700 In: 480 [P.O.:480] Out: -  Intake/Output this shift: Total I/O In: 240 [P.O.:240] Out: -   General appearance: alert, cooperative and no distress Heart: regular rate and rhythm Lungs: clear to auscultation bilaterally Abdomen: benign Extremities: + edema Wound: n/a  Lab Results: Recent Labs    06/08/18 0246  WBC 8.6  HGB 9.4*  HCT 30.1*  PLT 474*   BMET:  Recent Labs    06/08/18 0246  NA 134*  K 4.4  CL 97*  CO2 29  GLUCOSE 103*  BUN 13  CREATININE 0.98  CALCIUM 8.6*    PT/INR: No results for input(s): LABPROT, INR in the last 72 hours. ABG    Component Value Date/Time   TCO2 29 05/28/2018 0419   CBG (last 3)  No results for input(s): GLUCAP in the last 72 hours.  Meds Scheduled Meds: . ALPRAZolam  0.5 mg Oral BID  . digoxin  0.25 mg Oral Daily  . diltiazem  240 mg Oral Daily  . docusate sodium  100 mg Oral BID  . furosemide  20 mg Oral Daily  . losartan  25 mg Oral Daily  . multivitamin with minerals  1 tablet Oral Daily  . potassium chloride  10 mEq Oral Daily  . rosuvastatin  20 mg Oral q1800  . sodium chloride flush  3 mL Intravenous Q12H   Continuous Infusions: PRN Meds:.acetaminophen **OR** acetaminophen, bisacodyl, docusate sodium, fentaNYL (SUBLIMAZE)  injection, labetalol, lidocaine, ondansetron (ZOFRAN) IV, oxyCODONE  Xrays Ct Angio Chest/abd/pel For Dissection W And/or W/wo  Result Date: 06/06/2018 CLINICAL DATA:  Chest and back pain. EXAM: CT ANGIOGRAPHY CHEST, ABDOMEN AND PELVIS TECHNIQUE: Multidetector CT imaging through the chest, abdomen and pelvis was performed using the standard protocol during bolus administration of intravenous contrast. Multiplanar reconstructed images and MIPs were obtained and reviewed to evaluate the vascular anatomy. CONTRAST:  175mL ISOVUE-370 IOPAMIDOL (ISOVUE-370) INJECTION 76% COMPARISON:  CT scan of May 28, 2018. FINDINGS: CTA CHEST FINDINGS Cardiovascular: Continued presence of type B intramural hematoma is noted which extends into proximal abdominal aorta to the level of the renal arteries. No definite dissection is noted. This results in aneurysmal dilatation of the descending thoracic aorta with maximum measured diameter of 4.3 cm which is increased in size compared to prior exam. Great vessels are widely patent. Ascending aorta is unremarkable. Stable cardiomegaly is noted. No pericardial effusion is noted. Maximum thickness of intramural hematoma is 1.5 cm. There is the development of a small intramural blood pole in the distal descending thoracic aorta which measures 9 x 6 mm. Mediastinum/Nodes: No enlarged mediastinal, hilar, or axillary lymph nodes. Thyroid gland, trachea, and esophagus demonstrate no significant findings. Lungs/Pleura: No pneumothorax is  noted. Minimal left pleural effusion may be present. No consolidative process is noted in either lung. Musculoskeletal: No chest wall abnormality. No acute or significant osseous findings. Review of the MIP images confirms the above findings. CTA ABDOMEN AND PELVIS FINDINGS VASCULAR Aorta: Intramural hematoma extends from descending thoracic aorta into proximal aorta to the level of the renal arteries which is unchanged compared to prior exam. No aneurysm or  dissection is noted. Celiac: Patent without evidence of aneurysm, dissection, vasculitis or significant stenosis. SMA: Patent without evidence of aneurysm, dissection, vasculitis or significant stenosis. Renals: Both renal arteries are patent without evidence of aneurysm, dissection, vasculitis, fibromuscular dysplasia or significant stenosis. IMA: Patent without evidence of aneurysm, dissection, vasculitis or significant stenosis. Inflow: Patent without evidence of aneurysm, dissection, vasculitis or significant stenosis. Veins: No obvious venous abnormality within the limitations of this arterial phase study. Review of the MIP images confirms the above findings. NON-VASCULAR Hepatobiliary: No focal liver abnormality is seen. No gallstones, gallbladder wall thickening, or biliary dilatation. Pancreas: No inflammation is noted, but pancreatic duct measures 6 mm in pancreatic head. Spleen: Normal in size without focal abnormality. Adrenals/Urinary Tract: Adrenal glands are unremarkable. Kidneys are normal, without renal calculi, focal lesion, or hydronephrosis. Bladder is unremarkable. Stomach/Bowel: Stomach is within normal limits. Appendix appears normal. No evidence of bowel wall thickening, distention, or inflammatory changes. Lymphatic: No significant adenopathy is noted. Reproductive: Status post hysterectomy. No adnexal masses. Other: No abdominal wall hernia or abnormality. No abdominopelvic ascites. Musculoskeletal: No acute or significant osseous findings. Review of the MIP images confirms the above findings. IMPRESSION: Type B intramural hematoma is again noted extending from left subclavian artery to the proximal abdominal aorta to the level of the renal arteries. Increased aneurysmal dilatation of descending thoracic aorta is now noted with maximum measured diameter 4.3 cm. There is also interval development of small intramural blood pool involving medial portion of the descending thoracic aorta  measuring 9 x 6 mm. There is also the interval development of small left pleural effusion. Maximum hematoma thickness of 15 mm is noted. These findings suggest increased risk of morbidity involving the intramural hematoma. Mild pancreatic ductal dilatation is noted. Follow-up with CT scan or ultrasound in 6 months is recommended to ensure stability. Electronically Signed   By: Marijo Conception, M.D.   On: 06/06/2018 11:12    Assessment/Plan: S/P Procedure(s) (LRB): THORACIC AORTIC ENDOVASCULAR STENT GRAFT (N/A) BYPASS GRAFT CAROTID-SUBCLAVIAN (Left)  1 clinically stable on current management, BP well controlled 2 poss TEVAR soon     LOS: 11 days    John Giovanni 06/08/2018 Pager [phone]   I have seen and examined the patient and agree with the assessment and plan as outlined.  Rexene Alberts, MD 06/08/2018 12:24 PM

## 2018-06-08 NOTE — Progress Notes (Addendum)
[facility]  Subjective  - Continued CP, tylenol does help some.     Objective 108/60 71 98 F (36.7 C) (Oral) (!) 22 98%  Intake/Output Summary (Last 24 hours) at 06/08/2018 1337 Last data filed at 06/08/2018 1100 Gross per 24 hour  Intake 240 ml  Output -  Net 240 ml   Moving all 4 ext. Palpable radial, DP 2+ B. Respiration non labored breathing RA Gen NAD   Assessment/Planning: Descending thoracic intramural hematoma.  She has persistent pain in her chest.  Maintaining good perfusion to limbs and good BP control.  Dr. Donzetta Matters has discussed surgical intervention with her and she is in agreement.  If she still has pain he thinks it will be reasonable to proceed with  left subclavian revascularization either with bypass or transposition.  Dr. Donzetta Matters will reevaluate her Monday morning.  Possible procedure  Tuesday or Wednesday.  [name] 06/08/2018 1:37 PM --  Laboratory Lab Results: Recent Labs    06/08/18 0246  WBC 8.6  HGB 9.4*  HCT 30.1*  PLT 474*   BMET Recent Labs    06/08/18 0246  NA 134*  K 4.4  CL 97*  CO2 29  GLUCOSE 103*  BUN 13  CREATININE 0.98  CALCIUM 8.6*    COAG Lab Results  Component Value Date   INR 1.21 05/29/2018   INR 1.11 01/10/2013   INR 1.01 12/31/2012   No results found for: PTT    I have seen and evaluated the patient. I agree with the PA note as documented above. Chest pain somewhat improved since admission, but still rates it as 6/10.  Tentative repair this week with TEVAR.  Hemodynamics stable, palpable pedal pulses.  Marty Heck, MD [facility] Office: [phone] Pager: [phone]

## 2018-06-09 NOTE — Progress Notes (Signed)
[facility]  Subjective  - Continued CP 6/10 - but improved since admission.  HDS.  No changes overnight.   Objective (!) 107/57 76 98 F (36.7 C) (Oral) (!) 22 91%  Intake/Output Summary (Last 24 hours) at 06/09/2018 0906 Last data filed at 06/09/2018 0733 Gross per 24 hour  Intake 240 ml  Output 0 ml  Net 240 ml   Moving all 4 ext. Palpable DP 2+ bilaterally, palpable radial bilaterally. Respiration non labored breathing RA Gen NAD   Assessment/Planning: Descending thoracic intramural hematoma.  She has persistent pain in her chest.  Maintaining good perfusion to limbs and good BP control.  Dr. Donzetta Matters has discussed surgical intervention with her this week if pain not resolved. Dr. Donzetta Matters will reevaluate her Monday morning.  Possible procedure Tuesday likely TEVAR and left subclavian revascularization.  Marty Heck 06/09/2018 9:06 AM  Marty Heck, MD [facility] Office: [phone] Pager: [phone]

## 2018-06-09 NOTE — Progress Notes (Addendum)
      [address]       [place], [address]             [phone]        Procedure(s) (LRB): THORACIC AORTIC ENDOVASCULAR STENT GRAFT (N/A) BYPASS GRAFT CAROTID-SUBCLAVIAN (Left) Subjective: Feels ok, CP is about same , denies SOB  Objective: Vital signs in last 24 hours: Temp:  [98 F (36.7 C)-99.7 F (37.6 C)] 98 F (36.7 C) (08/18 0733) Pulse Rate:  [61-81] 76 (08/18 0733) Cardiac Rhythm: Normal sinus rhythm (08/18 0400) Resp:  [14-22] 22 (08/18 0733) BP: (98-120)/(50-62) 107/57 (08/18 0733) SpO2:  [91 %-98 %] 91 % (08/18 0733) Weight:  [72 kg] 72 kg (08/18 0500)  Hemodynamic parameters for last 24 hours:    Intake/Output from previous day: 08/17 0701 - 08/18 0700 In: 480 [P.O.:480] Out: -  Intake/Output this shift: No intake/output data recorded.  General appearance: alert, cooperative and no distress Heart: regular rate and rhythm and no murmur of AI Lungs: clear to auscultation bilaterally Abdomen: benign Extremities: + edema, palpable DP bilat  Lab Results: Recent Labs    06/08/18 0246  WBC 8.6  HGB 9.4*  HCT 30.1*  PLT 474*   BMET:  Recent Labs    06/08/18 0246  NA 134*  K 4.4  CL 97*  CO2 29  GLUCOSE 103*  BUN 13  CREATININE 0.98  CALCIUM 8.6*    PT/INR: No results for input(s): LABPROT, INR in the last 72 hours. ABG    Component Value Date/Time   TCO2 29 05/28/2018 0419   CBG (last 3)  No results for input(s): GLUCAP in the last 72 hours.  Meds Scheduled Meds: . ALPRAZolam  0.5 mg Oral BID  . digoxin  0.25 mg Oral Daily  . diltiazem  240 mg Oral Daily  . docusate sodium  100 mg Oral BID  . furosemide  20 mg Oral Daily  . losartan  25 mg Oral Daily  . multivitamin with minerals  1 tablet Oral Daily  . potassium chloride  10 mEq Oral Daily  . rosuvastatin  20 mg Oral q1800  . sodium chloride flush  3 mL Intravenous Q12H   Continuous Infusions: PRN Meds:.acetaminophen **OR** acetaminophen, bisacodyl,  docusate sodium, fentaNYL (SUBLIMAZE) injection, labetalol, lidocaine, ondansetron (ZOFRAN) IV, oxyCODONE  Xrays No results found.  Assessment/Plan: S/P Procedure(s) (LRB): THORACIC AORTIC ENDOVASCULAR STENT GRAFT (N/A) BYPASS GRAFT CAROTID-SUBCLAVIAN (Left)  1 hemodyn stable in sinus with good BP control 2 plans as per [facility] , surgery next week   LOS: 12 days    Samantha Stevenson 06/09/2018 Pager [phone]  I have seen and examined the patient and agree with the assessment and plan as outlined.  Rexene Alberts, MD 06/09/2018 11:40 AM

## 2018-06-10 MED ORDER — POLYETHYLENE GLYCOL 3350 17 G PO PACK: 17.0000 g | PACK | Freq: Every day | ORAL | Status: DC

## 2018-06-10 MED ORDER — LACTULOSE 10 GM/15ML PO SOLN
20.0000 g | Freq: Every day | ORAL | Status: DC | PRN
  Filled 2018-06-10: qty 30

## 2018-06-10 MED ORDER — POLYETHYLENE GLYCOL 3350 17 G PO PACK
17.0000 g | PACK | Freq: Every day | ORAL | Status: DC | PRN
  Administered 2018-06-10: 17 g via ORAL
  Filled 2018-06-10: qty 1

## 2018-06-10 NOTE — Plan of Care (Signed)
  Problem: Health Behavior/Discharge Planning: Goal: Ability to manage health-related needs will improve Outcome: Progressing   Problem: Activity: Goal: Risk for activity intolerance will decrease Outcome: Progressing   Problem: Nutrition: Goal: Adequate nutrition will be maintained Outcome: Progressing   Problem: Coping: Goal: Level of anxiety will decrease Outcome: Progressing   Problem: Elimination: Goal: Will not experience complications related to bowel motility Outcome: Progressing Goal: Will not experience complications related to urinary retention Outcome: Progressing   Problem: Safety: Goal: Ability to remain free from injury will improve Outcome: Progressing   Problem: Skin Integrity: Goal: Risk for impaired skin integrity will decrease Outcome: Progressing   Problem: Education: Goal: Knowledge of the prescribed therapeutic regimen will improve Outcome: Progressing   Problem: Bowel/Gastric: Goal: Gastrointestinal status for postoperative course will improve Outcome: Progressing   Problem: Cardiac: Goal: Ability to maintain an adequate cardiac output will improve Outcome: Progressing   Problem: Respiratory: Goal: Respiratory status will improve Outcome: Progressing

## 2018-06-10 NOTE — Consult Note (Signed)
  Progress Note    06/10/2018 9:15 AM * No surgery date entered *  Subjective: Still having 6 out of 10 chest pain which is anterior in nature  Vitals:   06/10/18 0404 06/10/18 0746  BP: 106/62 118/65  Pulse: 77 85  Resp: (!) 25 (!) 26  Temp: 98.6 F (37 C) 99.2 F (37.3 C)  SpO2: 98% 96%    Physical Exam: Awake alert and oriented Nonlabored respirations Palpable radial and pedal pulses bilaterally  CBC    Component Value Date/Time   WBC 8.6 06/08/2018 0246   RBC 3.47 (L) 06/08/2018 0246   HGB 9.4 (L) 06/08/2018 0246   HGB 10.1 (L) 03/12/2018 1358   HCT 30.1 (L) 06/08/2018 0246   HCT 32.5 (L) 03/12/2018 1358   PLT 474 (H) 06/08/2018 0246   PLT 317 03/12/2018 1358   MCV 86.7 06/08/2018 0246   MCV 86 03/12/2018 1358   MCH 27.1 06/08/2018 0246   MCHC 31.2 06/08/2018 0246   RDW 15.3 06/08/2018 0246   RDW 15.9 (H) 03/12/2018 1358   LYMPHSABS 1.2 05/28/2018 0417   MONOABS 0.5 05/28/2018 0417   EOSABS 0.0 05/28/2018 0417   BASOSABS 0.0 05/28/2018 0417    BMET    Component Value Date/Time   NA 134 (L) 06/08/2018 0246   NA 141 03/12/2018 1358   K 4.4 06/08/2018 0246   CL 97 (L) 06/08/2018 0246   CO2 29 06/08/2018 0246   GLUCOSE 103 (H) 06/08/2018 0246   BUN 13 06/08/2018 0246   BUN 13 03/12/2018 1358   CREATININE 0.98 06/08/2018 0246   CALCIUM 8.6 (L) 06/08/2018 0246   GFRNONAA 59 (L) 06/08/2018 0246   GFRAA >60 06/08/2018 0246    INR    Component Value Date/Time   INR 1.21 05/29/2018 0247     Intake/Output Summary (Last 24 hours) at 06/10/2018 0915 Last data filed at 06/09/2018 1500 Gross per 24 hour  Intake 240 ml  Output 0 ml  Net 240 ml     Assessment/plan:  67 y.o. female is here with a descending thoracic intramural hematoma that has expanded on repeat CT scanning.  She will need subclavian artery deep branching with either bypass or transposition as well as thoracic endograft doing.  We will plan this later this week unfortunately looks  like it will be Thursday rather than Tuesday.    Genna Casimir C. Donzetta Matters, MD [facility] Office: [phone] Pager: [phone]  06/10/2018 9:15 AM

## 2018-06-10 NOTE — Progress Notes (Addendum)
      [address]       [place] [address]             [phone]      Procedure(s) (LRB): THORACIC AORTIC ENDOVASCULAR STENT GRAFT (N/A) BYPASS GRAFT CAROTID-SUBCLAVIAN (Left)  Subjective:  No new complaints.  Continues to have back pain which has improved since admission.  She continues to be unable to move her bowels, she states she has tried multiple things without success.  Objective: Vital signs in last 24 hours: Temp:  [98.3 F (36.8 C)-99.5 F (37.5 C)] 99.2 F (37.3 C) (08/19 0746) Pulse Rate:  [61-85] 85 (08/19 0746) Cardiac Rhythm: Normal sinus rhythm (08/18 2045) Resp:  [18-29] 26 (08/19 0746) BP: (97-118)/(47-65) 118/65 (08/19 0746) SpO2:  [92 %-100 %] 96 % (08/19 0746) Weight:  [71.6 kg] 71.6 kg (08/19 0404)  Intake/Output from previous day: 08/18 0701 - 08/19 0700 In: 480 [P.O.:480] Out: 0   General appearance: alert, cooperative and no distress Heart: regular rate and rhythm Lungs: clear to auscultation bilaterally Abdomen: soft, non-tender; bowel sounds normal; no masses,  no organomegaly Extremities: extremities normal, atraumatic, no cyanosis or edema  Lab Results: Recent Labs    06/08/18 0246  WBC 8.6  HGB 9.4*  HCT 30.1*  PLT 474*   BMET:  Recent Labs    06/08/18 0246  NA 134*  K 4.4  CL 97*  CO2 29  GLUCOSE 103*  BUN 13  CREATININE 0.98  CALCIUM 8.6*    PT/INR: No results for input(s): LABPROT, INR in the last 72 hours. ABG    Component Value Date/Time   TCO2 29 05/28/2018 0419   CBG (last 3)  No results for input(s): GLUCAP in the last 72 hours.  Assessment/Plan: S/P Procedure(s) (LRB): THORACIC AORTIC ENDOVASCULAR STENT GRAFT (N/A) BYPASS GRAFT CAROTID-SUBCLAVIAN (Left)  1. Type B Aortic Dissection, continues to have pain- plan for possible TEVAR this week, vascular surgery following 2. GI- constipation, hasn't had any laxatives in over a week, will order again, she is hoping after surgery she  will be able to move her bowels 3. Dispo- patient stable, possible TEVAR tomorrow   LOS: 13 days    Ellwood Handler 06/10/2018   Patient remains very stable but with persistent pain from type B hematoma TEVAR later in week by Dr Donzetta Matters  patient examined and medical record reviewed,agree with above note. Tharon Aquas Trigt III 06/10/2018

## 2018-06-11 ENCOUNTER — Inpatient Hospital Stay (HOSPITAL_COMMUNITY): Payer: Medicare Other

## 2018-06-11 DIAGNOSIS — Z01810 Encounter for preprocedural cardiovascular examination: Secondary | ICD-10-CM

## 2018-06-11 MED ORDER — SORBITOL 70 % SOLN
30.0000 mL | Freq: Once | Status: AC
  Administered 2018-06-11: 30 mL via ORAL
  Filled 2018-06-11: qty 30

## 2018-06-11 MED ORDER — SORBITOL 70 % SOLN
30.0000 mL | Freq: Two times a day (BID) | Status: DC | PRN
  Administered 2018-06-11 – 2018-06-22 (×2): 30 mL via ORAL
  Filled 2018-06-11 (×4): qty 30

## 2018-06-11 MED ORDER — SORBITOL 70 % PO SOLN
30.0000 mL | Freq: Once | ORAL | Status: DC
  Filled 2018-06-11 (×2): qty 30

## 2018-06-11 MED ORDER — ALPRAZOLAM 0.5 MG PO TABS
0.5000 mg | ORAL_TABLET | Freq: Two times a day (BID) | ORAL | Status: DC | PRN
  Administered 2018-06-12 – 2018-06-14 (×3): 0.5 mg via ORAL
  Filled 2018-06-11 (×4): qty 1

## 2018-06-11 NOTE — Consult Note (Signed)
  Progress Note    06/11/2018 11:42 AM * No surgery date entered *  Subjective: Still having chest pain  Vitals:   06/11/18 0721 06/11/18 0752  BP: (!) 114/57 (!) 114/57  Pulse: 72 70  Resp: (!) 21 16  Temp: 98 F (36.7 C) 98 F (36.7 C)  SpO2:  97%    Physical Exam:  Awake alert and oriented Nonlabored respirations Palpable radial and pedal pulses bilaterally  CBC    Component Value Date/Time   WBC 8.6 06/08/2018 0246   RBC 3.47 (L) 06/08/2018 0246   HGB 9.4 (L) 06/08/2018 0246   HGB 10.1 (L) 03/12/2018 1358   HCT 30.1 (L) 06/08/2018 0246   HCT 32.5 (L) 03/12/2018 1358   PLT 474 (H) 06/08/2018 0246   PLT 317 03/12/2018 1358   MCV 86.7 06/08/2018 0246   MCV 86 03/12/2018 1358   MCH 27.1 06/08/2018 0246   MCHC 31.2 06/08/2018 0246   RDW 15.3 06/08/2018 0246   RDW 15.9 (H) 03/12/2018 1358   LYMPHSABS 1.2 05/28/2018 0417   MONOABS 0.5 05/28/2018 0417   EOSABS 0.0 05/28/2018 0417   BASOSABS 0.0 05/28/2018 0417    BMET    Component Value Date/Time   NA 134 (L) 06/08/2018 0246   NA 141 03/12/2018 1358   K 4.4 06/08/2018 0246   CL 97 (L) 06/08/2018 0246   CO2 29 06/08/2018 0246   GLUCOSE 103 (H) 06/08/2018 0246   BUN 13 06/08/2018 0246   BUN 13 03/12/2018 1358   CREATININE 0.98 06/08/2018 0246   CALCIUM 8.6 (L) 06/08/2018 0246   GFRNONAA 59 (L) 06/08/2018 0246   GFRAA >60 06/08/2018 0246    INR    Component Value Date/Time   INR 1.21 05/29/2018 0247     Intake/Output Summary (Last 24 hours) at 06/11/2018 1142 Last data filed at 06/11/2018 1100 Gross per 24 hour  Intake 720 ml  Output -  Net 720 ml     Assessment/plan:  67 y.o. female is here with a descending thoracic intramural hematoma that has expanded on repeat CT scanning.  She will need subclavian artery deep branching with either bypass or transposition as well as thoracic endograft doing.  Carotid duplex checked today.  We will check basic labs tomorrow plan for surgery on  Thursday.    Frida Wahlstrom C. Donzetta Matters, MD [facility] Office: [phone] Pager: [phone]  06/11/2018 11:42 AM

## 2018-06-11 NOTE — Progress Notes (Signed)
Nutrition Follow-up  DOCUMENTATION CODES:   Obesity unspecified  INTERVENTION:   -Continue MVI with minerals daily  NUTRITION DIAGNOSIS:   Inadequate oral intake related to decreased appetite as evidenced by meal completion < 25%, per patient/family report.  Progressing  GOAL:   Patient will meet greater than or equal to 90% of their needs  Progressing  MONITOR:   PO intake, Supplement acceptance, Labs, Weight trends, Skin, I & O's  REASON FOR ASSESSMENT:   Consult Assessment of nutrition requirement/status, Poor PO  ASSESSMENT:   67 year old hypertensive nondiabetic female presents with sudden onset of epigastric and back pain.  CTA performed in the ED demonstrated a thoracic aortic intramural hematoma extending from the left subclavian artery to the visceral vessels.  She has had associated nausea and vomiting.   8/9- s/p lt thoracentesis (400 ml fluid removed) due to pleural effusion; no pneumothorax on CXR 8/15- transferred from ICU to PCU, for CT of abdomen today  Pt undergoing carotid duplex exam at time of visit.   Pt's intake has improved since last visit; noted meal completion 100%. Pt taking MVI supplement.   Pt's last BM on 06/07/18; RN has been administering laxatives.   Per MD notes, plan for TEVAR on Thursday (06/13/18).   Labs reviewed: Na: 134.   Diet Order:   Diet Order            Diet NPO time specified  Diet effective midnight        Diet Heart Room service appropriate? Yes; Fluid consistency: Thin  Diet effective now              EDUCATION NEEDS:   Education needs have been addressed  Skin:  Skin Assessment: Reviewed RN Assessment  Last BM:  06/07/18  Height:   Ht Readings from Last 1 Encounters:  06/06/18 5\' 4"  (1.626 m)    Weight:   Wt Readings from Last 1 Encounters:  06/10/18 71.6 kg    Ideal Body Weight:  54.5 kg  BMI:  Body mass index is 27.1 kg/m.  Estimated Nutritional Needs:   Kcal:   1800-2000  Protein:  90-105 grams  Fluid:  1.8-2.0 L    Tarius Stangelo A. Jimmye Norman, RD, LDN, CDE Pager: [phone] After hours Pager: [phone]

## 2018-06-11 NOTE — Plan of Care (Signed)
  Problem: Health Behavior/Discharge Planning: Goal: Ability to manage health-related needs will improve Outcome: Progressing   Problem: Activity: Goal: Risk for activity intolerance will decrease Outcome: Progressing   Problem: Nutrition: Goal: Adequate nutrition will be maintained Outcome: Progressing   Problem: Coping: Goal: Level of anxiety will decrease Outcome: Progressing   Problem: Elimination: Goal: Will not experience complications related to bowel motility Outcome: Progressing Goal: Will not experience complications related to urinary retention Outcome: Progressing   Problem: Safety: Goal: Ability to remain free from injury will improve Outcome: Progressing   Problem: Skin Integrity: Goal: Risk for impaired skin integrity will decrease Outcome: Progressing   Problem: Education: Goal: Knowledge of the prescribed therapeutic regimen will improve Outcome: Progressing   Problem: Bowel/Gastric: Goal: Gastrointestinal status for postoperative course will improve Outcome: Progressing   Problem: Cardiac: Goal: Ability to maintain an adequate cardiac output will improve Outcome: Progressing   Problem: Respiratory: Goal: Respiratory status will improve Outcome: Progressing

## 2018-06-11 NOTE — Progress Notes (Signed)
Preliminary notes--Bilateral carotid duplex exam completed. Right ICA 40-59% stenosis based on diastolic velocity category; Left ICA 1-39% stenosis. Bilateral vertebral arteries patent with antegrade flow.  Hongying Tyshaun Vinzant (RDMS RVT) 06/11/18 1:08 PM

## 2018-06-11 NOTE — Progress Notes (Addendum)
      [address]       [place] [address]             [phone]          Procedure(s) (LRB): THORACIC AORTIC ENDOVASCULAR STENT GRAFT - spinal drainage (N/A) BYPASS GRAFT CAROTID-SUBCLAVIAN (Left)  Subjective: Patient still with chest and back pain and constipation.  Objective: Vital signs in last 24 hours: Temp:  [98 F (36.7 C)-99.2 F (37.3 C)] 98 F (36.7 C) (08/20 0752) Pulse Rate:  [69-81] 70 (08/20 0752) Cardiac Rhythm: Normal sinus rhythm (08/20 0752) Resp:  [16-28] 16 (08/20 0752) BP: (99-116)/(50-65) 114/57 (08/20 0752) SpO2:  [93 %-98 %] 97 % (08/20 0752)   Current Weight  06/10/18 71.6 kg       Intake/Output from previous day: 08/19 0701 - 08/20 0700 In: 840 [P.O.:840] Out: -    Physical Exam:  Cardiovascular: RRR Pulmonary: Clear to auscultation bilaterally Abdomen: Soft, non tender, bowel sounds present. Extremities: Trace bilateral lower extremity edema. Feet warm   Lab Results: CBC:No results for input(s): WBC, HGB, HCT, PLT in the last 72 hours. BMET: No results for input(s): NA, K, CL, CO2, GLUCOSE, BUN, CREATININE, CALCIUM in the last 72 hours.  PT/INR:  Lab Results  Component Value Date   INR 1.21 05/29/2018   INR 1.11 01/10/2013   INR 1.01 12/31/2012   ABG:  INR: Will add last result for INR, ABG once components are confirmed Will add last 4 CBG results once components are confirmed  Assessment/Plan:  1. CV - Results of CT angio of chest/abdomen/pelvis noted. Type B intramural hematoma of thoracic aorta. Dr. Donzetta Matters to do TEVAR Thursday along with simultaneous carotid subclavian bypass or transposition next week. BP under good control. On Digoxin 0.25 mg daily, Diltiazem 240 mg daily, and Losartan 25 mg daily. 2.  Pulmonary - On room air. 3. GI-constipation. She is willing to try Sorbitol. Mag citrate if that does not work.   Donielle M ZimmermanPA-C 06/11/2018,8:55 AM [phone]  Patient remains  stable with Type B thoracic IMH and persistent pain with plan for TEVAR by Dr Donzetta Matters later this week patient examined and medical record reviewed,agree with above note. Tharon Aquas Trigt III 06/11/2018

## 2018-06-12 LAB — CBC WITH DIFFERENTIAL/PLATELET
Abs Immature Granulocytes: 0.1 10*3/uL (ref 0.0–0.1)
Basophils Absolute: 0 10*3/uL (ref 0.0–0.1)
Basophils Relative: 0 %
Eosinophils Absolute: 0.1 10*3/uL (ref 0.0–0.7)
Eosinophils Relative: 2 %
HCT: 29.9 % — ABNORMAL LOW (ref 36.0–46.0)
Hemoglobin: 9.4 g/dL — ABNORMAL LOW (ref 12.0–15.0)
Immature Granulocytes: 1 %
Lymphocytes Relative: 24 %
Lymphs Abs: 2.1 10*3/uL (ref 0.7–4.0)
MCH: 27.1 pg (ref 26.0–34.0)
MCHC: 31.4 g/dL (ref 30.0–36.0)
MCV: 86.2 fL (ref 78.0–100.0)
Monocytes Absolute: 0.7 10*3/uL (ref 0.1–1.0)
Monocytes Relative: 8 %
Neutro Abs: 5.7 10*3/uL (ref 1.7–7.7)
Neutrophils Relative %: 65 %
Platelets: 551 10*3/uL — ABNORMAL HIGH (ref 150–400)
RBC: 3.47 MIL/uL — ABNORMAL LOW (ref 3.87–5.11)
RDW: 15.6 % — ABNORMAL HIGH (ref 11.5–15.5)
WBC: 8.7 10*3/uL (ref 4.0–10.5)

## 2018-06-12 LAB — BASIC METABOLIC PANEL
Anion gap: 9 (ref 5–15)
BUN: 15 mg/dL (ref 8–23)
CO2: 27 mmol/L (ref 22–32)
Calcium: 8.8 mg/dL — ABNORMAL LOW (ref 8.9–10.3)
Chloride: 98 mmol/L (ref 98–111)
Creatinine, Ser: 1.09 mg/dL — ABNORMAL HIGH (ref 0.44–1.00)
GFR calc Af Amer: >60 mL/min (ref >60)
GFR calc non Af Amer: 52 mL/min — ABNORMAL LOW (ref >60)
Glucose, Bld: 107 mg/dL — ABNORMAL HIGH (ref 70–99)
Potassium: 4.7 mmol/L (ref 3.5–5.1)
Sodium: 134 mmol/L — ABNORMAL LOW (ref 135–145)

## 2018-06-12 MED ORDER — VANCOMYCIN HCL IN DEXTROSE 1-5 GM/200ML-% IV SOLN
1000.0000 mg | INTRAVENOUS | Status: AC
  Administered 2018-06-13: 1000 mg via INTRAVENOUS
  Filled 2018-06-12 (×2): qty 200

## 2018-06-12 MED ORDER — CHLORHEXIDINE GLUCONATE 4 % EX LIQD
Freq: Once | CUTANEOUS | Status: AC
  Administered 2018-06-12: 22:00:00 via TOPICAL
  Filled 2018-06-12: qty 30

## 2018-06-12 NOTE — Progress Notes (Addendum)
      [address]       [place] [address]             [phone]      Subjective:  Patient states she feels bad this morning.  She states she moved her bowels but she is having pain in her abdomen.  Objective: Vital signs in last 24 hours: Temp:  [98 F (36.7 C)-100 F (37.8 C)] 100 F (37.8 C) (08/21 0418) Pulse Rate:  [69-79] 79 (08/21 0418) Cardiac Rhythm: Idioventricular (08/21 0535) Resp:  [13-29] 28 (08/21 0418) BP: (92-117)/(53-64) 117/61 (08/21 0418) SpO2:  [93 %-99 %] 93 % (08/21 0418)  Intake/Output from previous day: 08/20 0701 - 08/21 0700 In: 720 [P.O.:720] Out: -   General appearance: alert, cooperative and no distress Heart: regular rate and rhythm Lungs: clear to auscultation bilaterally Abdomen: soft, non-tender; bowel sounds normal; no masses,  no organomegaly Extremities: extremities normal, atraumatic, no cyanosis or edema and + pulses  Lab Results: Recent Labs    06/12/18 0244  WBC 8.7  HGB 9.4*  HCT 29.9*  PLT 551*   BMET:  Recent Labs    06/12/18 0244  NA 134*  K 4.7  CL 98  CO2 27  GLUCOSE 107*  BUN 15  CREATININE 1.09*  CALCIUM 8.8*    PT/INR: No results for input(s): LABPROT, INR in the last 72 hours. ABG    Component Value Date/Time   TCO2 29 05/28/2018 0419   CBG (last 3)  No results for input(s): GLUCAP in the last 72 hours.  Assessment/Plan: S/P Procedure(s) (LRB): THORACIC AORTIC ENDOVASCULAR STENT GRAFT - spinal drainage (N/A) BYPASS GRAFT CAROTID-SUBCLAVIAN (Left)  1. Type B Aortic Dissection- planning for TEVAR tomorrow, vascular surgery following 2. CV- NSR, BP is well controlled, on Digoxin, Cardizem, Cozaar 3. GI-constipation resolved 4. Pain-continues to have abdominal pain 5. Dispo- patient BP controlled, continues to have pain, bowels have moved, plan for TEVAR tomorrow   LOS: 15 days    Ellwood Handler 06/12/2018   Stable abdominal discomfort BP well controlled For TEVAR in am  - Dr Donzetta Matters  patient examined and medical record reviewed,agree with above note. Tharon Aquas Trigt III 06/12/2018

## 2018-06-12 NOTE — Plan of Care (Signed)
  Problem: Health Behavior/Discharge Planning: Goal: Ability to manage health-related needs will improve Outcome: Progressing   Problem: Activity: Goal: Risk for activity intolerance will decrease Outcome: Progressing   Problem: Nutrition: Goal: Adequate nutrition will be maintained Outcome: Progressing   Problem: Coping: Goal: Level of anxiety will decrease Outcome: Progressing   Problem: Elimination: Goal: Will not experience complications related to bowel motility Outcome: Progressing Goal: Will not experience complications related to urinary retention Outcome: Progressing   Problem: Safety: Goal: Ability to remain free from injury will improve Outcome: Progressing   Problem: Skin Integrity: Goal: Risk for impaired skin integrity will decrease Outcome: Progressing   Problem: Education: Goal: Knowledge of the prescribed therapeutic regimen will improve Outcome: Progressing   Problem: Bowel/Gastric: Goal: Gastrointestinal status for postoperative course will improve Outcome: Progressing   Problem: Cardiac: Goal: Ability to maintain an adequate cardiac output will improve Outcome: Progressing   Problem: Respiratory: Goal: Respiratory status will improve Outcome: Progressing

## 2018-06-12 NOTE — Consult Note (Signed)
  Progress Note    06/12/2018 12:44 PM * No surgery date entered *  Subjective: No acute issues, patient has had bowel movement  Vitals:   06/12/18 0813 06/12/18 1106  BP: 112/66 138/76  Pulse: 90   Resp: 15 (!) 29  Temp: 98.9 F (37.2 C) 99.2 F (37.3 C)  SpO2: 97% 94%    Physical Exam: Awake alert oriented Palpable radial and dorsalis pedis pulses bilaterally Abdomen is soft   CBC    Component Value Date/Time   WBC 8.7 06/12/2018 0244   RBC 3.47 (L) 06/12/2018 0244   HGB 9.4 (L) 06/12/2018 0244   HGB 10.1 (L) 03/12/2018 1358   HCT 29.9 (L) 06/12/2018 0244   HCT 32.5 (L) 03/12/2018 1358   PLT 551 (H) 06/12/2018 0244   PLT 317 03/12/2018 1358   MCV 86.2 06/12/2018 0244   MCV 86 03/12/2018 1358   MCH 27.1 06/12/2018 0244   MCHC 31.4 06/12/2018 0244   RDW 15.6 (H) 06/12/2018 0244   RDW 15.9 (H) 03/12/2018 1358   LYMPHSABS 2.1 06/12/2018 0244   MONOABS 0.7 06/12/2018 0244   EOSABS 0.1 06/12/2018 0244   BASOSABS 0.0 06/12/2018 0244    BMET    Component Value Date/Time   NA 134 (L) 06/12/2018 0244   NA 141 03/12/2018 1358   K 4.7 06/12/2018 0244   CL 98 06/12/2018 0244   CO2 27 06/12/2018 0244   GLUCOSE 107 (H) 06/12/2018 0244   BUN 15 06/12/2018 0244   BUN 13 03/12/2018 1358   CREATININE 1.09 (H) 06/12/2018 0244   CALCIUM 8.8 (L) 06/12/2018 0244   GFRNONAA 52 (L) 06/12/2018 0244   GFRAA >60 06/12/2018 0244    INR    Component Value Date/Time   INR 1.21 05/29/2018 0247     Intake/Output Summary (Last 24 hours) at 06/12/2018 1244 Last data filed at 06/11/2018 1800 Gross per 24 hour  Intake 480 ml  Output -  Net 480 ml     Assessment:  67 y.o. female is here with thoracic intramural hematoma.  Plan: Or tomorrow for left subclavian artery revascularization with thoracic endograft and.  I have again discussed with her the risk of stroke and paralysis she demonstrates good understanding.  This is being done to help relieve her chest pain and  hopefully seal the injury to her thoracic aorta.  We will get spinal drainage prior to proceeding.  N.p.o. past midnight.   Christien Frankl C. Donzetta Matters, MD [facility] Office: [phone] Pager: [phone]  06/12/2018 12:44 PM

## 2018-06-13 ENCOUNTER — Inpatient Hospital Stay (HOSPITAL_COMMUNITY): Payer: Medicare Other | Admitting: Certified Registered Nurse Anesthetist

## 2018-06-13 ENCOUNTER — Encounter (HOSPITAL_COMMUNITY): Payer: Self-pay | Admitting: Orthopedic Surgery

## 2018-06-13 ENCOUNTER — Inpatient Hospital Stay (HOSPITAL_COMMUNITY): Payer: Medicare Other

## 2018-06-13 ENCOUNTER — Encounter (HOSPITAL_COMMUNITY)
Admission: EM | Disposition: A | Discharge status: Home / Self Care | Payer: Self-pay | Source: Home / Self Care | Attending: Cardiothoracic Surgery

## 2018-06-13 DIAGNOSIS — I7101 Dissection of thoracic aorta: Secondary | ICD-10-CM

## 2018-06-13 DIAGNOSIS — I71019 Dissection of thoracic aorta, unspecified: Secondary | ICD-10-CM

## 2018-06-13 HISTORY — DX: Dissection of thoracic aorta: I71.01

## 2018-06-13 HISTORY — PX: THORACIC AORTIC ENDOVASCULAR STENT GRAFT: SHX6112

## 2018-06-13 HISTORY — PX: CAROTID-SUBCLAVIAN BYPASS GRAFT: SHX910

## 2018-06-13 HISTORY — DX: Dissection of thoracic aorta, unspecified: I71.019

## 2018-06-13 LAB — BASIC METABOLIC PANEL
Anion gap: 10 (ref 5–15)
BUN: 13 mg/dL (ref 8–23)
CO2: 26 mmol/L (ref 22–32)
Calcium: 8.8 mg/dL — ABNORMAL LOW (ref 8.9–10.3)
Chloride: 99 mmol/L (ref 98–111)
Creatinine, Ser: 0.92 mg/dL (ref 0.44–1.00)
GFR calc Af Amer: >60 mL/min (ref >60)
GFR calc non Af Amer: >60 mL/min (ref >60)
Glucose, Bld: 102 mg/dL — ABNORMAL HIGH (ref 70–99)
Potassium: 4.3 mmol/L (ref 3.5–5.1)
Sodium: 135 mmol/L (ref 135–145)

## 2018-06-13 LAB — CBC
HCT: 28.7 % — ABNORMAL LOW (ref 36.0–46.0)
Hemoglobin: 8.8 g/dL — ABNORMAL LOW (ref 12.0–15.0)
MCH: 26.3 pg (ref 26.0–34.0)
MCHC: 30.7 g/dL (ref 30.0–36.0)
MCV: 85.9 fL (ref 78.0–100.0)
Platelets: 598 10*3/uL — ABNORMAL HIGH (ref 150–400)
RBC: 3.34 MIL/uL — ABNORMAL LOW (ref 3.87–5.11)
RDW: 15.3 % (ref 11.5–15.5)
WBC: 9.4 10*3/uL (ref 4.0–10.5)

## 2018-06-13 LAB — PROTIME-INR
INR: 1.3
Prothrombin Time: 16.1 seconds — ABNORMAL HIGH (ref 11.4–15.2)

## 2018-06-13 LAB — PREPARE RBC (CROSSMATCH)

## 2018-06-13 LAB — SURGICAL PCR SCREEN
MRSA, PCR: NEGATIVE
Staphylococcus aureus: NEGATIVE

## 2018-06-13 LAB — ABO/RH: ABO/RH(D): B POS

## 2018-06-13 SURGERY — INSERTION, ENDOVASCULAR STENT GRAFT, AORTA, THORACIC
Anesthesia: General | Site: Chest

## 2018-06-13 MED ORDER — EPHEDRINE 5 MG/ML INJ
INTRAVENOUS | Status: AC
  Filled 2018-06-13: qty 10

## 2018-06-13 MED ORDER — PROTAMINE SULFATE 10 MG/ML IV SOLN
INTRAVENOUS | Status: DC | PRN
  Administered 2018-06-13: 30 mg via INTRAVENOUS
  Administered 2018-06-13: 20 mg via INTRAVENOUS

## 2018-06-13 MED ORDER — MIDAZOLAM HCL 2 MG/2ML IJ SOLN
INTRAMUSCULAR | Status: AC
  Administered 2018-06-13: 2 mg via INTRAVENOUS
  Filled 2018-06-13: qty 2

## 2018-06-13 MED ORDER — HEMOSTATIC AGENTS (NO CHARGE) OPTIME
TOPICAL | Status: DC | PRN
  Administered 2018-06-13: 1 via TOPICAL

## 2018-06-13 MED ORDER — MUPIROCIN 2 % EX OINT
1.0000 "application " | TOPICAL_OINTMENT | Freq: Once | CUTANEOUS | Status: AC
  Administered 2018-06-13: 1 via TOPICAL

## 2018-06-13 MED ORDER — METOPROLOL TARTRATE 5 MG/5ML IV SOLN: 2.0000 mg | INTRAVENOUS | Status: DC | PRN

## 2018-06-13 MED ORDER — LIDOCAINE HCL (PF) 1 % IJ SOLN
INTRAMUSCULAR | Status: AC
  Filled 2018-06-13: qty 30

## 2018-06-13 MED ORDER — FENTANYL CITRATE (PF) 100 MCG/2ML IJ SOLN
100.0000 ug | Freq: Once | INTRAMUSCULAR | Status: AC
  Administered 2018-06-13: 100 ug via INTRAVENOUS

## 2018-06-13 MED ORDER — LIDOCAINE 2% (20 MG/ML) 5 ML SYRINGE
INTRAMUSCULAR | Status: DC | PRN
  Administered 2018-06-13: 60 mg via INTRAVENOUS

## 2018-06-13 MED ORDER — VANCOMYCIN HCL IN DEXTROSE 1-5 GM/200ML-% IV SOLN
1000.0000 mg | Freq: Two times a day (BID) | INTRAVENOUS | Status: AC
  Administered 2018-06-14 (×2): 1000 mg via INTRAVENOUS
  Filled 2018-06-13 (×3): qty 200

## 2018-06-13 MED ORDER — IODIXANOL 320 MG/ML IV SOLN
INTRAVENOUS | Status: DC | PRN
  Administered 2018-06-13: 50 mL via INTRAVENOUS

## 2018-06-13 MED ORDER — FENTANYL CITRATE (PF) 250 MCG/5ML IJ SOLN
INTRAMUSCULAR | Status: DC | PRN
  Administered 2018-06-13 (×2): 50 ug via INTRAVENOUS

## 2018-06-13 MED ORDER — SODIUM CHLORIDE 0.9 % IV SOLN
INTRAVENOUS | Status: DC | PRN
  Administered 2018-06-13: 15:00:00

## 2018-06-13 MED ORDER — HEPARIN SODIUM (PORCINE) 1000 UNIT/ML IJ SOLN
INTRAMUSCULAR | Status: AC
  Filled 2018-06-13: qty 1

## 2018-06-13 MED ORDER — PANTOPRAZOLE SODIUM 40 MG PO TBEC
40.0000 mg | DELAYED_RELEASE_TABLET | Freq: Every day | ORAL | Status: DC
  Administered 2018-06-14 – 2018-06-23 (×10): 40 mg via ORAL
  Filled 2018-06-13 (×10): qty 1

## 2018-06-13 MED ORDER — OXYCODONE HCL 5 MG PO TABS
5.0000 mg | ORAL_TABLET | ORAL | Status: DC | PRN
  Administered 2018-06-13 – 2018-06-18 (×10): 10 mg via ORAL
  Administered 2018-06-19: 5 mg via ORAL
  Administered 2018-06-19 – 2018-06-21 (×4): 10 mg via ORAL
  Filled 2018-06-13: qty 2
  Filled 2018-06-13: qty 1
  Filled 2018-06-13 (×13): qty 2

## 2018-06-13 MED ORDER — DEXAMETHASONE SODIUM PHOSPHATE 10 MG/ML IJ SOLN
INTRAMUSCULAR | Status: AC
  Filled 2018-06-13: qty 1

## 2018-06-13 MED ORDER — SUGAMMADEX SODIUM 200 MG/2ML IV SOLN
INTRAVENOUS | Status: DC | PRN
  Administered 2018-06-13: 150 mg via INTRAVENOUS

## 2018-06-13 MED ORDER — PHENOL 1.4 % MT LIQD: 1.0000 | OROMUCOSAL | Status: DC | PRN

## 2018-06-13 MED ORDER — MUPIROCIN 2 % EX OINT
TOPICAL_OINTMENT | CUTANEOUS | Status: AC
  Administered 2018-06-13: 1 via TOPICAL
  Filled 2018-06-13: qty 22

## 2018-06-13 MED ORDER — SODIUM CHLORIDE 0.9% IV SOLUTION: Freq: Once | INTRAVENOUS | Status: DC

## 2018-06-13 MED ORDER — SODIUM CHLORIDE 0.9 % IV SOLN
500.0000 mL | Freq: Once | INTRAVENOUS | Status: AC | PRN
  Administered 2018-06-15: 500 mL via INTRAVENOUS

## 2018-06-13 MED ORDER — 0.9 % SODIUM CHLORIDE (POUR BTL) OPTIME
TOPICAL | Status: DC | PRN
  Administered 2018-06-13: 1000 mL

## 2018-06-13 MED ORDER — ROCURONIUM BROMIDE 50 MG/5ML IV SOSY
PREFILLED_SYRINGE | INTRAVENOUS | Status: AC
  Filled 2018-06-13: qty 10

## 2018-06-13 MED ORDER — ACETAMINOPHEN 325 MG RE SUPP: 325.0000 mg | RECTAL | Status: DC | PRN

## 2018-06-13 MED ORDER — ONDANSETRON HCL 4 MG/2ML IJ SOLN
INTRAMUSCULAR | Status: AC
  Filled 2018-06-13: qty 2

## 2018-06-13 MED ORDER — OXYCODONE HCL 5 MG/5ML PO SOLN: 5.0000 mg | Freq: Once | ORAL | Status: DC | PRN

## 2018-06-13 MED ORDER — FENTANYL CITRATE (PF) 100 MCG/2ML IJ SOLN: 25.0000 ug | INTRAMUSCULAR | Status: DC | PRN

## 2018-06-13 MED ORDER — FENTANYL CITRATE (PF) 250 MCG/5ML IJ SOLN
INTRAMUSCULAR | Status: AC
  Filled 2018-06-13: qty 5

## 2018-06-13 MED ORDER — LACTATED RINGERS IV SOLN
INTRAVENOUS | Status: DC | PRN
  Administered 2018-06-13 (×2): via INTRAVENOUS

## 2018-06-13 MED ORDER — ONDANSETRON HCL 4 MG/2ML IJ SOLN: 4.0000 mg | Freq: Once | INTRAMUSCULAR | Status: DC | PRN

## 2018-06-13 MED ORDER — PROPOFOL 10 MG/ML IV BOLUS
INTRAVENOUS | Status: DC | PRN
  Administered 2018-06-13: 120 mg via INTRAVENOUS

## 2018-06-13 MED ORDER — FENTANYL CITRATE (PF) 100 MCG/2ML IJ SOLN
INTRAMUSCULAR | Status: AC
  Administered 2018-06-13: 100 ug via INTRAVENOUS
  Filled 2018-06-13: qty 2

## 2018-06-13 MED ORDER — SODIUM CHLORIDE 0.9 % IV SOLN
INTRAVENOUS | Status: DC
  Administered 2018-06-13: 22:00:00 via INTRAVENOUS

## 2018-06-13 MED ORDER — ACETAMINOPHEN 325 MG PO TABS: 325.0000 mg | ORAL_TABLET | ORAL | Status: DC | PRN

## 2018-06-13 MED ORDER — SODIUM CHLORIDE 0.9 % IV SOLN
INTRAVENOUS | Status: AC
  Filled 2018-06-13 (×2): qty 1.2

## 2018-06-13 MED ORDER — PROPOFOL 10 MG/ML IV BOLUS
INTRAVENOUS | Status: AC
  Filled 2018-06-13: qty 40

## 2018-06-13 MED ORDER — LACTATED RINGERS IV SOLN
INTRAVENOUS | Status: DC
  Administered 2018-06-13: 13:00:00 via INTRAVENOUS

## 2018-06-13 MED ORDER — DEXAMETHASONE SODIUM PHOSPHATE 10 MG/ML IJ SOLN
INTRAMUSCULAR | Status: DC | PRN
  Administered 2018-06-13: 10 mg via INTRAVENOUS

## 2018-06-13 MED ORDER — GUAIFENESIN-DM 100-10 MG/5ML PO SYRP: 15.0000 mL | ORAL_SOLUTION | ORAL | Status: DC | PRN

## 2018-06-13 MED ORDER — VANCOMYCIN HCL IN DEXTROSE 1-5 GM/200ML-% IV SOLN
INTRAVENOUS | Status: AC
  Filled 2018-06-13: qty 200

## 2018-06-13 MED ORDER — MIDAZOLAM HCL 2 MG/2ML IJ SOLN
2.0000 mg | Freq: Once | INTRAMUSCULAR | Status: AC
  Administered 2018-06-13: 2 mg via INTRAVENOUS

## 2018-06-13 MED ORDER — ALUM & MAG HYDROXIDE-SIMETH 200-200-20 MG/5ML PO SUSP: 15.0000 mL | ORAL | Status: DC | PRN

## 2018-06-13 MED ORDER — PHENYLEPHRINE HCL 10 MG/ML IJ SOLN
INTRAMUSCULAR | Status: AC
  Filled 2018-06-13: qty 1

## 2018-06-13 MED ORDER — MIDAZOLAM HCL 2 MG/2ML IJ SOLN
INTRAMUSCULAR | Status: AC
  Filled 2018-06-13: qty 2

## 2018-06-13 MED ORDER — MAGNESIUM SULFATE 2 GM/50ML IV SOLN: 2.0000 g | Freq: Every day | INTRAVENOUS | Status: DC | PRN

## 2018-06-13 MED ORDER — HYDRALAZINE HCL 20 MG/ML IJ SOLN: 5.0000 mg | INTRAMUSCULAR | Status: DC | PRN

## 2018-06-13 MED ORDER — ROCURONIUM BROMIDE 10 MG/ML (PF) SYRINGE
PREFILLED_SYRINGE | INTRAVENOUS | Status: DC | PRN
  Administered 2018-06-13 (×2): 50 mg via INTRAVENOUS
  Administered 2018-06-13: 30 mg via INTRAVENOUS
  Administered 2018-06-13: 50 mg via INTRAVENOUS

## 2018-06-13 MED ORDER — POTASSIUM CHLORIDE CRYS ER 20 MEQ PO TBCR
20.0000 meq | EXTENDED_RELEASE_TABLET | Freq: Every day | ORAL | Status: AC | PRN
  Administered 2018-06-19: 20 meq via ORAL
  Filled 2018-06-13: qty 1

## 2018-06-13 MED ORDER — PHENYLEPHRINE 40 MCG/ML (10ML) SYRINGE FOR IV PUSH (FOR BLOOD PRESSURE SUPPORT)
PREFILLED_SYRINGE | INTRAVENOUS | Status: DC | PRN
  Administered 2018-06-13 (×2): 80 ug via INTRAVENOUS

## 2018-06-13 MED ORDER — OXYCODONE HCL 5 MG PO TABS: 5.0000 mg | ORAL_TABLET | Freq: Once | ORAL | Status: DC | PRN

## 2018-06-13 MED ORDER — LABETALOL HCL 5 MG/ML IV SOLN: 10.0000 mg | INTRAVENOUS | Status: DC | PRN

## 2018-06-13 MED ORDER — IODIXANOL 320 MG/ML IV SOLN
INTRAVENOUS | Status: DC | PRN
  Administered 2018-06-13: 95 mL via INTRAVENOUS

## 2018-06-13 MED ORDER — EPHEDRINE SULFATE-NACL 50-0.9 MG/10ML-% IV SOSY
PREFILLED_SYRINGE | INTRAVENOUS | Status: DC | PRN
  Administered 2018-06-13: 10 mg via INTRAVENOUS
  Administered 2018-06-13: 5 mg via INTRAVENOUS

## 2018-06-13 MED ORDER — BISACODYL 5 MG PO TBEC: 5.0000 mg | DELAYED_RELEASE_TABLET | Freq: Every day | ORAL | Status: DC | PRN

## 2018-06-13 MED ORDER — HEPARIN SODIUM (PORCINE) 1000 UNIT/ML IJ SOLN
INTRAMUSCULAR | Status: DC | PRN
  Administered 2018-06-13: 5000 [IU] via INTRAVENOUS
  Administered 2018-06-13: 7000 [IU] via INTRAVENOUS
  Administered 2018-06-13: 3000 [IU] via INTRAVENOUS
  Administered 2018-06-13: 5000 [IU] via INTRAVENOUS

## 2018-06-13 MED ORDER — HEPARIN SODIUM (PORCINE) 5000 UNIT/ML IJ SOLN
5000.0000 [IU] | Freq: Three times a day (TID) | INTRAMUSCULAR | Status: DC
  Administered 2018-06-13 – 2018-06-19 (×15): 5000 [IU] via SUBCUTANEOUS
  Filled 2018-06-13 (×15): qty 1

## 2018-06-13 MED ORDER — SENNOSIDES-DOCUSATE SODIUM 8.6-50 MG PO TABS
1.0000 | ORAL_TABLET | Freq: Every evening | ORAL | Status: DC | PRN
  Administered 2018-06-22: 1 via ORAL
  Filled 2018-06-13: qty 1

## 2018-06-13 MED ORDER — SODIUM CHLORIDE 0.9 % IV SOLN
INTRAVENOUS | Status: DC | PRN
  Administered 2018-06-13: 25 ug/min via INTRAVENOUS

## 2018-06-13 SURGICAL SUPPLY — 123 items
ADH SKN CLS APL DERMABOND .7 (GAUZE/BANDAGES/DRESSINGS) ×6
ADPR TBG 2 MALE LL ART (MISCELLANEOUS) ×3
AGENT HMST SPONGE THK3/8 (HEMOSTASIS)
ATTRACTOMAT 16X20 MAGNETIC DRP (DRAPES) ×1 IMPLANT
CANISTER SUCT 3000ML PPV (MISCELLANEOUS) ×4 IMPLANT
CATH ACCU-VU SIZ PIG 5F 100CM (CATHETERS) ×1 IMPLANT
CATH ANGIO 5F BER2 100CM (CATHETERS) ×1 IMPLANT
CATH EMB 4FR 80CM (CATHETERS) IMPLANT
CATH FOLEY LATEX FREE 14FR (CATHETERS) ×4
CATH FOLEY LF 14FR (CATHETERS) IMPLANT
CATH HEADHUNTER H1 5F 100CM (CATHETERS) ×1 IMPLANT
CATH INFINITI VERT 5FR 125CM (CATHETERS) ×1 IMPLANT
CATH ROBINSON RED A/P 18FR (CATHETERS) ×4 IMPLANT
CATH SILICONE 5CC 18FR (INSTRUMENTS) ×1 IMPLANT
CATH VISIONS PV .035 IVUS (CATHETERS) ×1 IMPLANT
CLIP VESOCCLUDE MED 6/CT (CLIP) ×4 IMPLANT
CLIP VESOCCLUDE SM WIDE 24/CT (CLIP) ×1 IMPLANT
CLIP VESOCCLUDE SM WIDE 6/CT (CLIP) ×4 IMPLANT
COVER PROBE W GEL 5X96 (DRAPES) ×4 IMPLANT
CRADLE DONUT ADULT HEAD (MISCELLANEOUS) ×4 IMPLANT
DERMABOND ADVANCED (GAUZE/BANDAGES/DRESSINGS) ×2
DERMABOND ADVANCED .7 DNX12 (GAUZE/BANDAGES/DRESSINGS) ×3 IMPLANT
DEVICE CLOSURE PERCLS PRGLD 6F (VASCULAR PRODUCTS) IMPLANT
DRAIN CHANNEL 15F RND FF W/TCR (WOUND CARE) IMPLANT
DRAIN SUBARACHNOID (WOUND CARE) ×1 IMPLANT
DRAPE ZERO GRAVITY STERILE (DRAPES) ×4 IMPLANT
DRSG TEGADERM 2-3/8X2-3/4 SM (GAUZE/BANDAGES/DRESSINGS) ×4 IMPLANT
DRYSEAL FLEXSHEATH 22FR 33CM (SHEATH) ×1
ELECT CAUTERY BLADE 6.4 (BLADE) ×6 IMPLANT
ELECT REM PT RETURN 9FT ADLT (ELECTROSURGICAL) ×8
ELECTRODE REM PT RTRN 9FT ADLT (ELECTROSURGICAL) ×6 IMPLANT
ENDOPROSTHESIS THORAC 28X28X15 (Endovascular Graft) IMPLANT
ENDOPROTH THORACIC 28X28X15 (Endovascular Graft) ×4 IMPLANT
EVACUATOR SILICONE 100CC (DRAIN) IMPLANT
GAUZE SPONGE 4X4 12PLY STRL (GAUZE/BANDAGES/DRESSINGS) ×4 IMPLANT
GLOVE BIO SURGEON STRL SZ7.5 (GLOVE) ×4 IMPLANT
GLOVE BIOGEL PI IND STRL 6.5 (GLOVE) IMPLANT
GLOVE BIOGEL PI IND STRL 7.0 (GLOVE) IMPLANT
GLOVE BIOGEL PI IND STRL 7.5 (GLOVE) IMPLANT
GLOVE BIOGEL PI INDICATOR 6.5 (GLOVE) ×2
GLOVE BIOGEL PI INDICATOR 7.0 (GLOVE) ×3
GLOVE BIOGEL PI INDICATOR 7.5 (GLOVE) ×3
GLOVE SKINSENSE NS SZ7.5 (GLOVE) ×1
GLOVE SKINSENSE STRL SZ7.5 (GLOVE) IMPLANT
GLOVE SURG SS PI 7.0 STRL IVOR (GLOVE) ×1 IMPLANT
GOWN STRL REUS W/ TWL LRG LVL3 (GOWN DISPOSABLE) ×6 IMPLANT
GOWN STRL REUS W/ TWL XL LVL3 (GOWN DISPOSABLE) ×3 IMPLANT
GOWN STRL REUS W/TWL LRG LVL3 (GOWN DISPOSABLE) ×8
GOWN STRL REUS W/TWL XL LVL3 (GOWN DISPOSABLE) ×4
GRAFT BALLN CATH 65CM (STENTS) IMPLANT
HEMOSTAT SNOW SURGICEL 2X4 (HEMOSTASIS) IMPLANT
HEMOSTAT SPONGE AVITENE ULTRA (HEMOSTASIS) IMPLANT
HEMOSTAT SURGICEL 2X14 (HEMOSTASIS) ×1 IMPLANT
INSERT FOGARTY SM (MISCELLANEOUS) ×4 IMPLANT
IV ADAPTER SYR DOUBLE MALE LL (MISCELLANEOUS) ×4 IMPLANT
KIT BASIN OR (CUSTOM PROCEDURE TRAY) ×4 IMPLANT
KIT DRAIN CSF ACCUDRAIN (MISCELLANEOUS) ×1 IMPLANT
KIT ENCORE 26 ADVANTAGE (KITS) ×1 IMPLANT
KIT SUCTION CATH 14FR (SUCTIONS) ×1 IMPLANT
KIT TURNOVER KIT B (KITS) ×4 IMPLANT
LOOP VESSEL MAXI BLUE (MISCELLANEOUS) ×1 IMPLANT
LOOP VESSEL MINI RED (MISCELLANEOUS) ×1 IMPLANT
NDL HYPO 25GX1X1/2 BEV (NEEDLE) IMPLANT
NDL PERC 18GX7CM (NEEDLE) ×3 IMPLANT
NEEDLE HYPO 25GX1X1/2 BEV (NEEDLE) IMPLANT
NEEDLE PERC 18GX7CM (NEEDLE) ×4 IMPLANT
NS IRRIG 1000ML POUR BTL (IV SOLUTION) ×12 IMPLANT
PACK CAROTID (CUSTOM PROCEDURE TRAY) ×4 IMPLANT
PACK ENDOVASCULAR (PACKS) ×4 IMPLANT
PAD ARMBOARD 7.5X6 YLW CONV (MISCELLANEOUS) ×8 IMPLANT
PENCIL BUTTON HOLSTER BLD 10FT (ELECTRODE) ×5 IMPLANT
PERCLOSE PROGLIDE 6F (VASCULAR PRODUCTS) ×12
SET COLLECT BLD 21X3/4 12 PB (MISCELLANEOUS) ×4 IMPLANT
SET MICROPUNCTURE 5F STIFF (MISCELLANEOUS) ×1 IMPLANT
SHEATH AVANTI 11CM 8FR (SHEATH) IMPLANT
SHEATH DRYSEAL FLEX 22FR 33CM (SHEATH) IMPLANT
SHEATH PINNACLE 8F 10CM (SHEATH) ×1 IMPLANT
SHEATH PINNACLE ST 6F 45CM (SHEATH) ×1 IMPLANT
SHEATH SHUTTLE SELECT 6F (SHEATH) ×1 IMPLANT
SHIELD RADPAD SCOOP 12X17 (MISCELLANEOUS) ×8 IMPLANT
SHUNT CAROTID BYPASS 10 (VASCULAR PRODUCTS) IMPLANT
SHUNT CAROTID BYPASS 12FRX15.5 (VASCULAR PRODUCTS) IMPLANT
SLEEVE ISOL F/PACE RF HD COVER (MISCELLANEOUS) ×1 IMPLANT
SPONGE LAP 18X18 X RAY DECT (DISPOSABLE) ×1 IMPLANT
STENT GRAFT BALLN CATH 65CM (STENTS)
STENT INNOVA 7X40X130 (Permanent Stent) ×1 IMPLANT
STENT INNOVA 8X40X130 (Permanent Stent) ×1 IMPLANT
STENT THORAC ACS 34X34X20 (Endovascular Graft) ×1 IMPLANT
STOPCOCK 4 WAY LG BORE MALE ST (IV SETS) IMPLANT
STOPCOCK MORSE 400PSI 3WAY (MISCELLANEOUS) ×5 IMPLANT
SUT ETHILON 3 0 PS 1 (SUTURE) IMPLANT
SUT MNCRL AB 4-0 PS2 18 (SUTURE) IMPLANT
SUT PROLENE 5 0 C 1 24 (SUTURE) ×6 IMPLANT
SUT PROLENE 6 0 BV (SUTURE) ×4 IMPLANT
SUT PROLENE 7 0 BV 1 (SUTURE) ×1 IMPLANT
SUT PROLENE 7 0 BV1 MDA (SUTURE) ×1 IMPLANT
SUT SILK 3 0 (SUTURE)
SUT SILK 3-0 18XBRD TIE 12 (SUTURE) IMPLANT
SUT VIC AB 2-0 CT1 27 (SUTURE) ×4
SUT VIC AB 2-0 CT1 TAPERPNT 27 (SUTURE) IMPLANT
SUT VIC AB 3-0 SH 27 (SUTURE) ×16
SUT VIC AB 3-0 SH 27X BRD (SUTURE) ×6 IMPLANT
SUT VIC AB 4-0 PS2 18 (SUTURE) ×1 IMPLANT
SUT VICRYL 4-0 PS2 18IN ABS (SUTURE) ×4 IMPLANT
SYR 10ML LL (SYRINGE) ×2 IMPLANT
SYR 30ML LL (SYRINGE) IMPLANT
SYR 3ML LL SCALE MARK (SYRINGE) ×1 IMPLANT
SYR 50ML LL SCALE MARK (SYRINGE) ×4 IMPLANT
SYR CONTROL 10ML LL (SYRINGE) IMPLANT
TAPE UMBILICAL COTTON 1/8X30 (MISCELLANEOUS) ×1 IMPLANT
TOWEL GREEN STERILE (TOWEL DISPOSABLE) ×4 IMPLANT
TRAY FOLEY MTR SLVR 16FR STAT (SET/KITS/TRAYS/PACK) ×4 IMPLANT
TRAY FOLEY SLVR 16FR LF STAT (SET/KITS/TRAYS/PACK) ×1 IMPLANT
TUBE CONNECTING 12X1/4 (SUCTIONS) ×1 IMPLANT
TUBING ART PRESS 48 MALE/FEM (TUBING) ×4 IMPLANT
TUBING EXTENTION W/L.L. (IV SETS) IMPLANT
TUBING HIGH PRESSURE 120CM (CONNECTOR) ×5 IMPLANT
WATER STERILE IRR 1000ML POUR (IV SOLUTION) ×4 IMPLANT
WIRE AMPLATZ SS-J .035X180CM (WIRE) ×1 IMPLANT
WIRE BENTSON .035X145CM (WIRE) ×1 IMPLANT
WIRE EMERALD 3MM-J .035X260CM (WIRE) ×1 IMPLANT
WIRE HI TORQ VERSACORE J 260CM (WIRE) ×1 IMPLANT
WIRE STIFF LUNDERQUIST 260CM (WIRE) ×1 IMPLANT

## 2018-06-13 NOTE — Anesthesia Procedure Notes (Signed)
Arterial Line Insertion Start/End8/22/2019 12:55 PM, 06/13/2018 1:00 PM Performed by: Wilburn Cornelia, CRNA, CRNA  Patient location: Pre-op. Preanesthetic checklist: patient identified, IV checked, site marked, risks and benefits discussed, surgical consent, monitors and equipment checked, pre-op evaluation, timeout performed and anesthesia consent Lidocaine 1% used for infiltration and patient sedated Left, radial was placed Catheter size: 20 G Hand hygiene performed , maximum sterile barriers used  and Seldinger technique used Allen's test indicative of satisfactory collateral circulation Attempts: 1 Procedure performed without using ultrasound guided technique. Following insertion, dressing applied and Biopatch. Post procedure assessment: normal and unchanged  Patient tolerated the procedure well with no immediate complications.

## 2018-06-13 NOTE — Anesthesia Preprocedure Evaluation (Signed)
Anesthesia Evaluation  Patient identified by MRN, date of birth, ID band Patient awake    Reviewed: Allergy & Precautions, NPO status , Patient's Chart, lab work & pertinent test results  Airway Mallampati: II  TM Distance: >3 FB Neck ROM: Full    Dental  (+) Edentulous Upper, Partial Lower   Pulmonary    breath sounds clear to auscultation       Cardiovascular hypertension,  Rhythm:Regular Rate:Normal     Neuro/Psych    GI/Hepatic   Endo/Other    Renal/GU      Musculoskeletal   Abdominal   Peds  Hematology   Anesthesia Other Findings   Reproductive/Obstetrics                             Anesthesia Physical Anesthesia Plan  ASA: III  Anesthesia Plan: General   Post-op Pain Management:    Induction: Intravenous  PONV Risk Score and Plan: Ondansetron and Dexamethasone  Airway Management Planned: Oral ETT  Additional Equipment: Arterial line, CVP and Spinal Drain  Intra-op Plan:   Post-operative Plan: Extubation in OR  Informed Consent: I have reviewed the patients History and Physical, chart, labs and discussed the procedure including the risks, benefits and alternatives for the proposed anesthesia with the patient or authorized representative who has indicated his/her understanding and acceptance.   Dental advisory given  Plan Discussed with: CRNA and Anesthesiologist  Anesthesia Plan Comments:         Anesthesia Quick Evaluation

## 2018-06-13 NOTE — Anesthesia Procedure Notes (Deleted)
Lumbar CSF Drain

## 2018-06-13 NOTE — Op Note (Addendum)
Patient name: Samantha Stevenson MRN: [id number] DOB: April 14, 1951 Sex: female  06/13/2018 Pre-operative Diagnosis:  Post-operative diagnosis:  Same Surgeon:  Erlene Quan C. Donzetta Matters, MD Assistant: Fortunato Curling, MD; Laurence Slate, PA Procedure Performed: 1.  Left subclavian to carotid artery transposition 2.  Percutaneous access right common femoral artery 3.  Thoracic endovascular endo-grafting with left subclavian artery coverage with 28 x 28 x 15 cm distal and 34 x 34 x 20 cm proximal Gore CTAG 4.  Left common carotid artery stent with 7 x 40 mm Innova distal an 8 x 40 mm Innova proximally 5.  Right common femoral endarterectomy with greater saphenous vein patch angioplasty 6.  Intravascular ultrasound thoracic aorta.   Indications: 67 year old female with a history of an intramural hematoma extending from the level of her subclavian artery down to the celiac artery at least by recent CT scan.  She has persistent chest pain and is indicated for repair.  Findings: The left subclavian and common carotid arteries had significant soft plaque consistent with extension of intramural hematoma.  After subclavian transposition there were expected signals in the common carotid, left vertebral, and left subclavian arteries and there was an expected waveform at the left radial artery arterial line at the wrist.  By intravascular ultrasound there was large intramural hematoma beginning just distal to the left subclavian artery extending down through the entire thoracic aorta and there was what appeared to be ulceration 2 cm above the proximal iliac artery.  The aortic diameter was 34 mm at the landing zone and 70mm distally. Following endo-grafting from the innominate artery to the celiac artery we had adequate flow to the bilateral common femoral arteries.  There was no balloon dilatation of the endograft.  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  In reviewing the CT scan it appears  this may have been chronic.  We elected to stent this and unfortunately our first I did cover the subclavian transposition although we still had expected waveform at the left radial artery line.  We then placed a stent proximally extending just to the takeoff of the left common carotid artery.  The stent was not ballooned.  We had good flow extending all the way up to the common into the internal carotid artery.  After removing our 45 French sheath in the right common femoral artery we had no signals at the right foot and then performed endarterectomy and vein patch angioplasty which we had very strong signal at the right dorsalis pedis artery.  The   Procedure:  The patient was identified in the holding area and taken to room 16 where she was placed on the operating table and a CSF drain was placed.  She was then placed supine general endotracheal anesthesia was induced she was sterilely prepped and draped in the left neck bilateral groin areas in the usual fashion.  A timeout was called.  A transverse incision was made 1 cm above the left clavicle extending laterally from the sternal cleidomastoid.  We dissected down divided the platysma identified the fat pad retracted the superior laterally dividing lymphatics between clips and ties.  I did identify the thoracic duct which was ligated.  Veins were taken between ties.  The phrenic nerve was identified and protected.  The anterior scalene muscle was divided with electrocautery to expose the subclavian artery.  I then dissected out the internal mammary artery followed by the vertebral artery placed Vesseloops around these.  There is also  a thyrocervical trunk that was isolated.  I dissected down to the subclavian as far as I could reach.  It did appear to have hematoma extending into it.  The IJ was then reflected medially and the common carotid artery was dissected out for several centimeters and a vessel loop placed around this.  Patient was then fully  heparinized at this time remained heparinized throughout the case until protamine was given at the end.  I then clamped our outflow by tightening our Vesseloops.  Physically the artery was clamped proximally and divided leaving a cuff.  I did oversew this with a 5-0 Prolene suture in a vertical mattress fashion and at completion there was no bleeding but I did leave the tags in place.  Subclavian artery was then brought to the level of the common carotid artery.  The artery was clamped distally and proximally and opened longitudinally.  There was severe disease throughout that as well.  I sewed the subclavian artery end to side to the common carotid artery with 5-0 Prolene suture.  I then allowed backbleeding through the common carotid artery as well as antegrade bleeding and backbleeding from the subclavian and opened into the right arm.  There was an expected waveform in the radial artery line that had been placed in the left radial artery at the wrist.  There were good signals in the common carotid artery as well as the vertebral.  Unfortunately did notice that we did have some leakage of clear fluid consistent with rent in the pleura.  I attempted to place a 3-0 Vicryl figure-of-eight stitch but this did not repair the rent.  A wet lap was placed.  We then turned our attention to the right groin.  Ultrasound was used to identify the common femoral artery and a skin nick was made.  We dissected bluntly down to the level of the common femoral and cannulated this with micropuncture needle and placed the sheath followed by Bentson wire.  2 pro glide devices were deployed medially and laterally at the 2 and 10 position.  An 8 French sheath was placed and a wire was placed into the thoracic aorta and intravascular ultrasound was performed.  The findings above were noted particularly the diameter of the aorta at 34 mm at the level of the innominate artery.  With this we exchanged for a stiff wire placed a 22 French  sheath under fluoroscopic guidance.  We brought our distal 20 x 20 x 15 cm endovascular graft just above the celiac artery.  Angiogram was performed via a buddy wire and pigtail catheter placement.  We then deployed our distal device.  We then replaced our brachial catheter to the thoracic aorta and performed angiogram which demonstrated our innominate artery.  The 34 x 34 x 20 cm piece was then brought to the level of the innominate artery and deployed.  This deployed well seem to exclude the entire IMH we did not post balloon.  We did notice a common carotid artery dissection on the left.  We then reviewed the CT scan which demonstrated this was likely a chronic process but given that it caused underfilling of our carotid artery distally as well as her transposed subclavian artery we elected to stent this.  I placed a JR4 catheter and underscore wire to select the common carotid artery across the stenosed area from dissection performed angiogram demonstrating intraluminal access.  We then exchanged for a long 6 [place] sheath.  Then brought a 7 x 40  Innova stent into place after measuring for this size.  We did note the takeoff of our transposition as well as our common carotid artery on the left.  We then deployed the stent but unfortunately it jumped proximally 1 cm forward and cross her transposed artery.  We then brought another 8 x 40 stent to the new level and performed an angled views of the common carotid artery after placing the pigtail catheter back in the ascending aorta.  With the stent was then deployed and deployed just at the level as expected.  We had no further dissection filled our transposed subclavian artery as well as our internal carotid artery without flow disturbance.  Satisfied with this we elected to remove our catheters and wires.  We then placed a Bentson wire into our 22 French sheath removed the sheath and deployed our pro-glide devices without issue.  We then checked our right foot  but did not have any signals.  We did have strong femoral pulses bilaterally.  The right foot was cooler than the left and so we elected to make a transverse incision in the groin dissected down to the common femoral artery.  There was clearly no pulse distal to the common femoral.  We clamped the outflow profunda and SFA followed by the external iliac artery.  We open the artery longitudinally where there was significant soft plaque that was similar to the subclavian and carotid arteries.  We removed all of this had very strong inflow.  We then tacked the distal plaque at the level of the SFA profunda bifurcation with 7-0 Prolene suture.  We then identified the greater saphenous vein traces for several centimeters clipped it distally and divided it and then transected it at the junction tied off with 2-0 silk suture.  It was opened longitudinally reversed trimmed to size and sewn in place as a common femoral artery patch with 5-0 Prolene suture.  Prior to completion anastomosis usual flushing maneuvers were undertaken.  We opened we did have a very strong signal at the dorsalis pedis on the right.  Satisfied with this 50 mg of protamine was given.  We obtained hemostasis irrigated both of our wounds.  A few traction sutures were placed in the right common femoral artery patch.  We then closed that wound with Vicryl and Monocryl and Dermabond was placed to level the skin.  In the left neck we closed the platysma and then placed a nonlatex catheter into the wound and placed under suction filled the wound with saline.  Valsalva was performed by anesthesia we completed our suture line and removed our catheter.  This was done because of the pleural rent that was created earlier.  We then closed the skin with 4-0 Vicryl suture and Dermabond was placed at the level of the skin there as well.  Patient was then allowed away from anesthesia having tolerated the procedure well.  She was moving all 4 extremities at completion.   All counts were correct as well.  Contrast: 140 cc  Estimated blood loss 250 cc.   Brandon C. Donzetta Matters, MD [facility] Office: [phone] Pager: [phone]

## 2018-06-13 NOTE — Plan of Care (Signed)
Patient awaiting a TAVR scheduled for today. Patient up and ambulating to the restroom, had a bowel movement 06/12/18. Patient currently NPO for her procedure.

## 2018-06-13 NOTE — Anesthesia Procedure Notes (Signed)
Central Venous Catheter Insertion Performed by: Roberts Gaudy, MD, anesthesiologist Start/End8/22/2019 12:50 PM, 06/13/2018 1:00 PM Patient location: Pre-op. Preanesthetic checklist: patient identified, IV checked, site marked, risks and benefits discussed, surgical consent, monitors and equipment checked, pre-op evaluation, timeout performed and anesthesia consent Position: Trendelenburg Lidocaine 1% used for infiltration and patient sedated Hand hygiene performed , maximum sterile barriers used  and Seldinger technique used Catheter size: 8 Fr Total catheter length 16. Central line was placed.Double lumen Procedure performed using ultrasound guided technique. Ultrasound Notes:anatomy identified, needle tip was noted to be adjacent to the nerve/plexus identified, no ultrasound evidence of intravascular and/or intraneural injection and image(s) printed for medical record Attempts: 1 Following insertion, dressing applied, line sutured and Biopatch. Post procedure assessment: blood return through all ports  Patient tolerated the procedure well with no immediate complications.

## 2018-06-13 NOTE — Anesthesia Postprocedure Evaluation (Signed)
Anesthesia Post Note  Patient: Samantha Stevenson  Procedure(s) Performed: LEFT  SUBCLAVIAN TO CAROTID ARTERY TRANSPOSITION; THORACIC AORTIC ENDOVASCULAR STENT GRAFT using GORE CONFORMABLE THORACIC STENT GRAFT AND GORE TAG THORACIC ENDOPROSTHESIS; STENT LEFT COMMON CAROTID ARTERY, RIGHT COMMON-FEMORAL ENDARTERECTOMY WITH PATCH ANGIOPLASTY (Left Abdomen) BYPASS GRAFT CAROTID-SUBCLAVIAN (Left Chest) INTRASVASCULAR ULTRASOUND OF THE THORACIC AORTA (N/A Abdomen)     Patient location during evaluation: PACU Anesthesia Type: General Level of consciousness: awake Pain management: pain level controlled Vital Signs Assessment: post-procedure vital signs reviewed and stable Respiratory status: spontaneous breathing, nonlabored ventilation, respiratory function stable and patient connected to nasal cannula oxygen Cardiovascular status: blood pressure returned to baseline and stable Postop Assessment: no apparent nausea or vomiting Anesthetic complications: no    Last Vitals:  Vitals:   06/13/18 2100 06/13/18 2130  BP: (!) 144/66   Pulse: 90 92  Resp: (!) 27 (!) 23  Temp:    SpO2: 96% 97%    Last Pain:  Vitals:   06/13/18 1951  TempSrc:   PainSc: 0-No pain                 Ryan P Ellender

## 2018-06-13 NOTE — Anesthesia Procedure Notes (Signed)
Procedure Name: Intubation Date/Time: 06/13/2018 1:40 PM Performed by: Colin Benton, CRNA Pre-anesthesia Checklist: Patient identified, Emergency Drugs available, Suction available and Patient being monitored Patient Re-evaluated:Patient Re-evaluated prior to induction Oxygen Delivery Method: Circle system utilized Preoxygenation: Pre-oxygenation with 100% oxygen Induction Type: IV induction Ventilation: Mask ventilation without difficulty Laryngoscope Size: Miller and 2 Grade View: Grade I Tube type: Oral Tube size: 7.0 mm Number of attempts: 1 Airway Equipment and Method: Stylet Placement Confirmation: ETT inserted through vocal cords under direct vision,  positive ETCO2 and breath sounds checked- equal and bilateral Secured at: 21 cm Tube secured with: Tape Dental Injury: Teeth and Oropharynx as per pre-operative assessment

## 2018-06-13 NOTE — Progress Notes (Signed)
Dr. Donzetta Matters came by to see patient. Regarding lumbar drain, as long as patient is moving everything, no need to transduce drain in PACU. The drain will need to be transduced upon arrival to unit.

## 2018-06-13 NOTE — Transfer of Care (Signed)
Immediate Anesthesia Transfer of Care Note  Patient: Samantha Stevenson  Procedure(s) Performed: LEFT  SUBCLAVIAN TO CAROTID ARTERY TRANSPOSITION; THORACIC AORTIC ENDOVASCULAR STENT GRAFT using GORE CONFORMABLE THORACIC STENT GRAFT AND GORE TAG THORACIC ENDOPROSTHESIS; STENT LEFT COMMON CAROTID ARTERY, RIGHT COMMON-FEMORAL ENDARTERECTOMY WITH PATCH ANGIOPLASTY (Left Abdomen) BYPASS GRAFT CAROTID-SUBCLAVIAN (Left Chest) INTRASVASCULAR ULTRASOUND OF THE THORACIC AORTA (N/A Abdomen)  Patient Location: PACU  Anesthesia Type:General  Level of Consciousness: sedated and patient cooperative  Airway & Oxygen Therapy: Patient connected to nasal cannula oxygen  Post-op Assessment: Report given to RN and Post -op Vital signs reviewed and stable  Post vital signs: Reviewed and stable  Last Vitals:  Vitals Value Taken Time  BP 132/71 06/13/2018  7:21 PM  Temp    Pulse 88 06/13/2018  7:35 PM  Resp 20 06/13/2018  7:35 PM  SpO2 98 % 06/13/2018  7:35 PM  Vitals shown include unvalidated device data.  Last Pain:  Vitals:   06/13/18 0900  TempSrc:   PainSc: 3       Patients Stated Pain Goal: 1 (83/66/29 [id number])  Complications: No apparent anesthesia complications

## 2018-06-13 NOTE — Progress Notes (Signed)
  Progress Note    06/13/2018 8:32 AM * No surgery date entered *  Subjective: Still having chest pain has had a bowel movement but abdominal pain still there as well  Vitals:   06/13/18 0330 06/13/18 0752  BP: (!) 108/54 (!) 110/59  Pulse: 73 72  Resp: (!) 22 (!) 28  Temp: 99.1 F (37.3 C) 98.9 F (37.2 C)  SpO2: 99% 98%    Physical Exam: Awake alert and oriented Moving all extremities well Palpable radial and pedal pulses bilaterally Abdomen is soft nontender and no masses are present  CBC    Component Value Date/Time   WBC 9.4 06/13/2018 0228   RBC 3.34 (L) 06/13/2018 0228   HGB 8.8 (L) 06/13/2018 0228   HGB 10.1 (L) 03/12/2018 1358   HCT 28.7 (L) 06/13/2018 0228   HCT 32.5 (L) 03/12/2018 1358   PLT 598 (H) 06/13/2018 0228   PLT 317 03/12/2018 1358   MCV 85.9 06/13/2018 0228   MCV 86 03/12/2018 1358   MCH 26.3 06/13/2018 0228   MCHC 30.7 06/13/2018 0228   RDW 15.3 06/13/2018 0228   RDW 15.9 (H) 03/12/2018 1358   LYMPHSABS 2.1 06/12/2018 0244   MONOABS 0.7 06/12/2018 0244   EOSABS 0.1 06/12/2018 0244   BASOSABS 0.0 06/12/2018 0244    BMET    Component Value Date/Time   NA 135 06/13/2018 0228   NA 141 03/12/2018 1358   K 4.3 06/13/2018 0228   CL 99 06/13/2018 0228   CO2 26 06/13/2018 0228   GLUCOSE 102 (H) 06/13/2018 0228   BUN 13 06/13/2018 0228   BUN 13 03/12/2018 1358   CREATININE 0.92 06/13/2018 0228   CALCIUM 8.8 (L) 06/13/2018 0228   GFRNONAA >60 06/13/2018 0228   GFRAA >60 06/13/2018 0228    INR    Component Value Date/Time   INR 1.30 06/13/2018 0228     Intake/Output Summary (Last 24 hours) at 06/13/2018 0832 Last data filed at 06/12/2018 1702 Gross per 24 hour  Intake 480 ml  Output -  Net 480 ml     Assessment:  67 y.o. female is here with thoracic intramural hematoma.  She has persistent chest pain that is unresolved despite blood pressure management.  Plan: Or today for left subclavian transposition versus bypass and  thoracic endograft.  I discussed with her the risk of stroke and paralysis as well as the risk of arterial injury and possible need for further procedures in the future.  She demonstrates good understanding.  We will have a spinal drain placed today for postop monitoring given the length of coverage she will need.   Lillymae Duet C. Donzetta Matters, MD [facility] Office: [phone] Pager: [phone]  06/13/2018 8:32 AM

## 2018-06-13 NOTE — Anesthesia Procedure Notes (Signed)
Lumbar CSF Drain Insertion Lumbar CSF drain insertion: Following induction of general endotracheal anesthesia, the patient was turned to the left lateral decubitus position and the lumbar area was prepped with ChloraPrep.  Using strict sterile technique with full gown, gloves and draping, a Silastic CSF drainage catheter was inserted into the ill 3 4 interspace using a 14-gauge Touhy needle.  One attempt was required to enter the subarachnoid space in the midline. The catheter was then inserted 7 cm into the subarachnoid space.  There was clear CSF with good flow.  The catheter was then taped securely in place.  The CSF drainage monitor will be set up for 8 cm of water CSF pressure.  Roberts Gaudy, MD  Procedure Start: 13:45 Procedure End: 13:50

## 2018-06-14 LAB — BASIC METABOLIC PANEL
Anion gap: 10 (ref 5–15)
BUN: 11 mg/dL (ref 8–23)
CO2: 25 mmol/L (ref 22–32)
Calcium: 8.3 mg/dL — ABNORMAL LOW (ref 8.9–10.3)
Chloride: 100 mmol/L (ref 98–111)
Creatinine, Ser: 0.86 mg/dL (ref 0.44–1.00)
GFR calc Af Amer: >60 mL/min (ref >60)
GFR calc non Af Amer: >60 mL/min (ref >60)
Glucose, Bld: 168 mg/dL — ABNORMAL HIGH (ref 70–99)
Potassium: 4.5 mmol/L (ref 3.5–5.1)
Sodium: 135 mmol/L (ref 135–145)

## 2018-06-14 LAB — CBC
HCT: 29.6 % — ABNORMAL LOW (ref 36.0–46.0)
Hemoglobin: 9 g/dL — ABNORMAL LOW (ref 12.0–15.0)
MCH: 26.5 pg (ref 26.0–34.0)
MCHC: 30.4 g/dL (ref 30.0–36.0)
MCV: 87.3 fL (ref 78.0–100.0)
Platelets: 533 10*3/uL — ABNORMAL HIGH (ref 150–400)
RBC: 3.39 MIL/uL — ABNORMAL LOW (ref 3.87–5.11)
RDW: 15.2 % (ref 11.5–15.5)
WBC: 11.4 10*3/uL — ABNORMAL HIGH (ref 4.0–10.5)

## 2018-06-14 LAB — GLUCOSE, CAPILLARY: Glucose-Capillary: 148 mg/dL — ABNORMAL HIGH (ref 70–99)

## 2018-06-14 NOTE — Progress Notes (Signed)
This was from previous shift report and note from anesthesiology.

## 2018-06-14 NOTE — Progress Notes (Signed)
  Progress Note    06/14/2018 8:16 AM 1 Day Post-Op  Subjective: She is feeling well and chest pain actually somewhat better this morning  Vitals:   06/14/18 0600 06/14/18 0745  BP: 118/71   Pulse: 90   Resp: (!) 27   Temp:  98.7 F (37.1 C)  SpO2: 95%     Physical Exam: Awake alert oriented She is moving all of her extremities well without limitation Nonlabored respirations Abdomen is soft nontender nondistended Left neck and right femoral incisions are clean dry and intact Palpable dorsalis pedis pulses bilaterally  CBC    Component Value Date/Time   WBC 11.4 (H) 06/14/2018 0501   RBC 3.39 (L) 06/14/2018 0501   HGB 9.0 (L) 06/14/2018 0501   HGB 10.1 (L) 03/12/2018 1358   HCT 29.6 (L) 06/14/2018 0501   HCT 32.5 (L) 03/12/2018 1358   PLT 533 (H) 06/14/2018 0501   PLT 317 03/12/2018 1358   MCV 87.3 06/14/2018 0501   MCV 86 03/12/2018 1358   MCH 26.5 06/14/2018 0501   MCHC 30.4 06/14/2018 0501   RDW 15.2 06/14/2018 0501   RDW 15.9 (H) 03/12/2018 1358   LYMPHSABS 2.1 06/12/2018 0244   MONOABS 0.7 06/12/2018 0244   EOSABS 0.1 06/12/2018 0244   BASOSABS 0.0 06/12/2018 0244    BMET    Component Value Date/Time   NA 135 06/14/2018 0501   NA 141 03/12/2018 1358   K 4.5 06/14/2018 0501   CL 100 06/14/2018 0501   CO2 25 06/14/2018 0501   GLUCOSE 168 (H) 06/14/2018 0501   BUN 11 06/14/2018 0501   BUN 13 03/12/2018 1358   CREATININE 0.86 06/14/2018 0501   CALCIUM 8.3 (L) 06/14/2018 0501   GFRNONAA >60 06/14/2018 0501   GFRAA >60 06/14/2018 0501    INR    Component Value Date/Time   INR 1.30 06/13/2018 0228     Intake/Output Summary (Last 24 hours) at 06/14/2018 0816 Last data filed at 06/14/2018 0700 Gross per 24 hour  Intake 3339.4 ml  Output 2782 ml  Net 557.4 ml     Assessment/plan:  67 y.o. female is s/p status post left subclavian transposition and thoracic endograft doing for substantial IMH of her thoracic aorta.  She also had dissection of  her left common carotid artery that had to be stented and unfortunately crossed her transposed subclavian artery we will need to put her on Plavix once our lumbar drain is out.  She is neurologically intact and I think it is okay to remove that at the discretion of anesthesia and then she can be out of bed later today.  She can have a diet as tolerated.  Plan will be to restart Xarelto prior to discharge home for chronic atrial fibrillation.   Samantha Stevenson Matters, MD [facility] Office: [phone] Pager: [phone]  06/14/2018 8:16 AM

## 2018-06-14 NOTE — Progress Notes (Signed)
Received patient from the pacu with lumbar drain set at 7cm of water. Anesthesia's not had it at 8cm. Will monitor closely

## 2018-06-14 NOTE — Addendum Note (Signed)
Addendum  created 06/14/18 1704 by Roberts Gaudy, MD   Sign clinical note

## 2018-06-14 NOTE — Progress Notes (Signed)
Anesthesiology Note:  Patient doing well one day S/P insertion of thoracic endovascular stent, L. subclavian artery transposition and L. common carotid artery stent. Lumbar CSF drain inserted prior to procedure and has been functioning well. Patient neurologically intact. She received her last dose of 5,000 units SQ heparin at 05:36 today. Spinal drain removed without difficulty, tip intact, site OK.   Patient should remain at bedrest until tomorrow morning, SQ  Heparin can be resumed at 21:00 today. Plan hourly LE neuro checks X 4.  Roberts Gaudy

## 2018-06-15 ENCOUNTER — Inpatient Hospital Stay (HOSPITAL_COMMUNITY): Payer: Medicare Other

## 2018-06-15 LAB — BODY FLUID CELL COUNT WITH DIFFERENTIAL
Lymphs, Fluid: 96 %
Monocyte-Macrophage-Serous Fluid: 4 % — ABNORMAL LOW (ref 50–90)
Neutrophil Count, Fluid: 0 % (ref 0–25)
Total Nucleated Cell Count, Fluid: 339 cu mm (ref 0–1000)

## 2018-06-15 LAB — BASIC METABOLIC PANEL
Anion gap: 11 (ref 5–15)
Anion gap: 6 (ref 5–15)
BUN: 23 mg/dL (ref 8–23)
BUN: 19 mg/dL (ref 8–23)
CO2: 22 mmol/L (ref 22–32)
CO2: 21 mmol/L — ABNORMAL LOW (ref 22–32)
Calcium: 8.3 mg/dL — ABNORMAL LOW (ref 8.9–10.3)
Calcium: 6.4 mg/dL — CL (ref 8.9–10.3)
Chloride: 101 mmol/L (ref 98–111)
Chloride: 109 mmol/L (ref 98–111)
Creatinine, Ser: 1.2 mg/dL — ABNORMAL HIGH (ref 0.44–1.00)
Creatinine, Ser: 0.98 mg/dL (ref 0.44–1.00)
GFR calc Af Amer: 54 mL/min — ABNORMAL LOW (ref >60)
GFR calc Af Amer: >60 mL/min (ref >60)
GFR calc non Af Amer: 46 mL/min — ABNORMAL LOW (ref >60)
GFR calc non Af Amer: 59 mL/min — ABNORMAL LOW (ref >60)
Glucose, Bld: 119 mg/dL — ABNORMAL HIGH (ref 70–99)
Glucose, Bld: 102 mg/dL — ABNORMAL HIGH (ref 70–99)
Potassium: 4.8 mmol/L (ref 3.5–5.1)
Potassium: 3.6 mmol/L (ref 3.5–5.1)
Sodium: 134 mmol/L — ABNORMAL LOW (ref 135–145)
Sodium: 136 mmol/L (ref 135–145)

## 2018-06-15 LAB — POCT I-STAT, CHEM 8
BUN: 18 mg/dL (ref 8–23)
Calcium, Ion: 0.98 mmol/L — ABNORMAL LOW (ref 1.15–1.40)
Chloride: 106 mmol/L (ref 98–111)
Creatinine, Ser: 0.9 mg/dL (ref 0.44–1.00)
Glucose, Bld: 98 mg/dL (ref 70–99)
HCT: 23 % — ABNORMAL LOW (ref 36.0–46.0)
Hemoglobin: 7.8 g/dL — ABNORMAL LOW (ref 12.0–15.0)
Potassium: 3.9 mmol/L (ref 3.5–5.1)
Sodium: 138 mmol/L (ref 135–145)
TCO2: 20 mmol/L — ABNORMAL LOW (ref 22–32)

## 2018-06-15 LAB — CBC WITH DIFFERENTIAL/PLATELET
Abs Immature Granulocytes: 0.2 10*3/uL — ABNORMAL HIGH (ref 0.0–0.1)
Basophils Absolute: 0 10*3/uL (ref 0.0–0.1)
Basophils Relative: 0 %
Eosinophils Absolute: 0 10*3/uL (ref 0.0–0.7)
Eosinophils Relative: 0 %
HCT: 25.3 % — ABNORMAL LOW (ref 36.0–46.0)
Hemoglobin: 7.5 g/dL — ABNORMAL LOW (ref 12.0–15.0)
Immature Granulocytes: 1 %
Lymphocytes Relative: 12 %
Lymphs Abs: 1.6 10*3/uL (ref 0.7–4.0)
MCH: 26.4 pg (ref 26.0–34.0)
MCHC: 29.6 g/dL — ABNORMAL LOW (ref 30.0–36.0)
MCV: 89.1 fL (ref 78.0–100.0)
Monocytes Absolute: 1.3 10*3/uL — ABNORMAL HIGH (ref 0.1–1.0)
Monocytes Relative: 10 %
Neutro Abs: 10.1 10*3/uL — ABNORMAL HIGH (ref 1.7–7.7)
Neutrophils Relative %: 77 %
Platelets: 495 10*3/uL — ABNORMAL HIGH (ref 150–400)
RBC: 2.84 MIL/uL — ABNORMAL LOW (ref 3.87–5.11)
RDW: 15.3 % (ref 11.5–15.5)
WBC: 13.3 10*3/uL — ABNORMAL HIGH (ref 4.0–10.5)

## 2018-06-15 LAB — MAGNESIUM: Magnesium: 1.8 mg/dL (ref 1.7–2.4)

## 2018-06-15 LAB — GLUCOSE, PLEURAL OR PERITONEAL FLUID: Glucose, Fluid: 118 mg/dL

## 2018-06-15 LAB — PROTEIN, PLEURAL OR PERITONEAL FLUID: Total protein, fluid: 3.7 g/dL

## 2018-06-15 MED ORDER — CLOPIDOGREL BISULFATE 300 MG PO TABS
300.0000 mg | ORAL_TABLET | Freq: Once | ORAL | Status: AC
  Administered 2018-06-15: 300 mg via ORAL
  Filled 2018-06-15: qty 1

## 2018-06-15 MED ORDER — SODIUM CHLORIDE 0.9 % IV SOLN
INTRAVENOUS | Status: DC
  Administered 2018-06-15 – 2018-06-20 (×9): via INTRAVENOUS

## 2018-06-15 MED ORDER — SODIUM CHLORIDE 0.9 % IV BOLUS
1000.0000 mL | Freq: Once | INTRAVENOUS | Status: AC
  Administered 2018-06-15: 1000 mL via INTRAVENOUS

## 2018-06-15 MED ORDER — SODIUM CHLORIDE 0.9 % IV SOLN
2.0000 g | Freq: Once | INTRAVENOUS | Status: AC
  Administered 2018-06-15: 2 g via INTRAVENOUS
  Filled 2018-06-15: qty 20

## 2018-06-15 MED ORDER — PROMETHAZINE HCL 25 MG/ML IJ SOLN: 12.5000 mg | Freq: Four times a day (QID) | INTRAMUSCULAR | Status: DC | PRN

## 2018-06-15 MED ORDER — POTASSIUM CHLORIDE CRYS ER 10 MEQ PO TBCR
10.0000 meq | EXTENDED_RELEASE_TABLET | Freq: Once | ORAL | Status: AC
  Administered 2018-06-15: 10 meq via ORAL
  Filled 2018-06-15: qty 1

## 2018-06-15 MED ORDER — CLOPIDOGREL BISULFATE 75 MG PO TABS
75.0000 mg | ORAL_TABLET | Freq: Every day | ORAL | Status: DC
  Administered 2018-06-16 – 2018-06-23 (×8): 75 mg via ORAL
  Filled 2018-06-15 (×8): qty 1

## 2018-06-15 MED ORDER — MAGNESIUM SULFATE 2 GM/50ML IV SOLN
2.0000 g | Freq: Once | INTRAVENOUS | Status: AC
  Administered 2018-06-15: 2 g via INTRAVENOUS
  Filled 2018-06-15: qty 50

## 2018-06-15 MED ORDER — LIDOCAINE HCL (PF) 1 % IJ SOLN
INTRAMUSCULAR | Status: AC
  Filled 2018-06-15: qty 30

## 2018-06-15 NOTE — Progress Notes (Signed)
When nursing team attempted to get patient up to chair the patient's blood pressure rapidly dropped to 70s/30s and she complained of nausea and dizziness. Attempted to start a 500cc fluid bolus as a standing PRN order, but the peripheral IV placed last night with 3x attempts was not patent. IV team was paged for a stat IV placement due to difficult stick. Dr. Donzetta Matters contacted for orders. Stat EKG completed noting sinus tachycardia. Dr. Donzetta Matters ordered 1 liter fluid bolus, maintenance fluids at 100cc/hr and a BMP/CBC. Lab team paged to come to the bedside for lab draw. Still awaiting IV team. Patient currently lying flat with blood pressure 87/57, sinus tach in the 120s, respirations at 39 and 100% sp02 on 2L of oxygen via nasal cannula.

## 2018-06-15 NOTE — Progress Notes (Signed)
Dr. Donzetta Matters placed a 7 french TLC in the left femoral vein with positive blood return. Consent was signed prior to placement, timeout conducted before procedure and sterile technique observed during the entire procedure. Patient received a 1 liter NS bolus following placement with CVP monitoring between 2-3. Labs were drawn with a critical calcium reported. RN communicated critical lab values to Dr. Donzetta Matters. Dr. Donzetta Matters ordered magnesium and calcium electrolyte replacement and an additional liter of fluid bolus. Patient is alert and oriented, but continues to be drowsy. Family was updated per the wishes of the patient.

## 2018-06-15 NOTE — Progress Notes (Signed)
  Progress Note    06/15/2018 9:30 AM 2 Days Post-Op  Subjective: No overnight issues, chest and abdominal pain are improved  Vitals:   06/15/18 0900 06/15/18 0915  BP: (!) 80/62 (!) 87/57  Pulse: (!) 128 (!) 130  Resp: (!) 32 (!) 27  Temp:    SpO2: 98% 98%    Physical Exam: Awake alert oriented Left neck incision clean dry intact Palpable left radial pulse Abdomen is soft and nondistended Right groin incision clean dry intact Palpable dorsalis pedis pulses bilaterally  CBC    Component Value Date/Time   WBC 11.4 (H) 06/14/2018 0501   RBC 3.39 (L) 06/14/2018 0501   HGB 9.0 (L) 06/14/2018 0501   HGB 10.1 (L) 03/12/2018 1358   HCT 29.6 (L) 06/14/2018 0501   HCT 32.5 (L) 03/12/2018 1358   PLT 533 (H) 06/14/2018 0501   PLT 317 03/12/2018 1358   MCV 87.3 06/14/2018 0501   MCV 86 03/12/2018 1358   MCH 26.5 06/14/2018 0501   MCHC 30.4 06/14/2018 0501   RDW 15.2 06/14/2018 0501   RDW 15.9 (H) 03/12/2018 1358   LYMPHSABS 2.1 06/12/2018 0244   MONOABS 0.7 06/12/2018 0244   EOSABS 0.1 06/12/2018 0244   BASOSABS 0.0 06/12/2018 0244    BMET    Component Value Date/Time   NA 135 06/14/2018 0501   NA 141 03/12/2018 1358   K 4.5 06/14/2018 0501   CL 100 06/14/2018 0501   CO2 25 06/14/2018 0501   GLUCOSE 168 (H) 06/14/2018 0501   BUN 11 06/14/2018 0501   BUN 13 03/12/2018 1358   CREATININE 0.86 06/14/2018 0501   CALCIUM 8.3 (L) 06/14/2018 0501   GFRNONAA >60 06/14/2018 0501   GFRAA >60 06/14/2018 0501    INR    Component Value Date/Time   INR 1.30 06/13/2018 0228     Intake/Output Summary (Last 24 hours) at 06/15/2018 0930 Last data filed at 06/15/2018 0900 Gross per 24 hour  Intake 455.7 ml  Output 1215 ml  Net -759.3 ml     Assessment/plan:  67 y.o. female is status post left subclavian artery transposition with thoracic endograft in for IMH with persistent chest pain.  She also had stenting of her left common carotid artery for dissection.  She has  been progressing well and is neurologically intact however became tachycardic overnight and hypotensive with standing.  We are bolusing 1 L of fluid and will run her on fluids.  I am also checking a BMP and CBC stat as EKG was consistent with sinus tachycardia.  I will also get a x-ray of her chest she had a previous pneumothorax although I am less concerned about this because she is oxygenating well.  We will start Plavix today.   Mykle Pascua C. Donzetta Matters, MD [facility] [place] Office: [phone] Pager: [phone]  06/15/2018 9:30 AM

## 2018-06-15 NOTE — Progress Notes (Signed)
   Patient evaluated at bedside continues to be tachypneic and tachycardic although both have improved as has her ability to interact.  She does have 5 out of 10 chest pain similar to her preoperative status.  Her chest x-ray demonstrates a large left-sided pleural effusion and interventional radiology is going to drain this tonight with ultrasound guidance.  Their care of this patient is greatly appreciated.  Should she have persistent chest pain or hemodynamic instability post pleural effusion drainage we will consider CT Angio of dissection protocol to evaluate for extension of her IMH.  I discussed plan with patient and her family and they are agreeable as stated above.  Servando Snare, MD

## 2018-06-15 NOTE — Op Note (Signed)
    Patient name: Samantha Stevenson MRN: [id number] DOB: 1951/01/20 Sex: female  06/13/2018 Pre-operative Diagnosis: Emergent need for IV access, thoracic aortic intramural hematoma Post-operative diagnosis:  Same Surgeon:  Erlene Quan C. Donzetta Matters, MD Procedure Performed:  Placement of 7 French left common femoral central venous line with ultrasound guidance  Indications: 67 year old female with recent history of thoracic endovascular endograft and with subclavian transposition, stenting of her left common carotid artery and repair of a right common femoral artery injury.  Overnight she became tachycardic and hypotensive with standing and lost IV access.  She now is in need of urgent IV fluids lab draws and I discussed with her the risk and benefits of proceeding with emergent IV access via central venous line consent was signed and she agrees to proceed.  Findings: The vein is nearly absent and the artery is very easily compressible consistent with significant dehydration.  I completion a 7 French triple-lumen 20 cm line was placed to the left common femoral vein and it flushed and withdrew easily.   Procedure:  The patient was identified in her room and consent was signed.  Timeout was called and she was prepped in the left groin and sterile drapes were placed.  1% lidocaine was used to anesthetize the left groin.  Ultrasound was used to identify the common femoral vein this was cannulated with 18-gauge needle and wire passed easily.  The tract was dilated and a 7 French triple-lumen 20 cm line was placed over the wire and the wire was removed.  All lines flushed and withdrew fluid easily and were clamped.  A sterile dressing was applied.  Caps were applied.  Line is ready for use.  Patient tolerated procedure without immediate complication.   Advay Volante C. Donzetta Matters, MD [facility] [place] Office: [phone] Pager: [phone]

## 2018-06-15 NOTE — Progress Notes (Signed)
IV not patent. IV team paged.

## 2018-06-15 NOTE — Progress Notes (Signed)
IV team could not place peripheral IV. IV team unable to place PICC due to patient's status and labile blood pressure. Tachycardic, tachypenic, with new solmnence. RN called Dr. Donzetta Matters to update on continually declining status and request for orders. He is currently in surgery and unable to come place a central line, Dr. Donzetta Matters requested RN to contact Critical Care team to come place access as soon as possible. Patient's blood pressure 94/27 currently. RN has paged Critical Care. Awaiting his call.

## 2018-06-15 NOTE — Procedures (Signed)
L thora 1.5 L CXR pending EBL 0 Comp 0

## 2018-06-16 ENCOUNTER — Encounter (HOSPITAL_COMMUNITY)
Admission: EM | Disposition: A | Discharge status: Home / Self Care | Payer: Self-pay | Source: Home / Self Care | Attending: Cardiothoracic Surgery

## 2018-06-16 ENCOUNTER — Inpatient Hospital Stay (HOSPITAL_COMMUNITY): Payer: Medicare Other

## 2018-06-16 ENCOUNTER — Inpatient Hospital Stay (HOSPITAL_COMMUNITY): Payer: Medicare Other | Admitting: Certified Registered Nurse Anesthetist

## 2018-06-16 ENCOUNTER — Encounter (HOSPITAL_COMMUNITY): Payer: Self-pay | Admitting: Surgery

## 2018-06-16 HISTORY — PX: WOUND EXPLORATION: SHX6188

## 2018-06-16 LAB — CBC WITH DIFFERENTIAL/PLATELET
Abs Immature Granulocytes: 0.1 10*3/uL (ref 0.0–0.1)
Basophils Absolute: 0 10*3/uL (ref 0.0–0.1)
Basophils Relative: 0 %
Eosinophils Absolute: 0 10*3/uL (ref 0.0–0.7)
Eosinophils Relative: 0 %
HCT: 26.7 % — ABNORMAL LOW (ref 36.0–46.0)
Hemoglobin: 8 g/dL — ABNORMAL LOW (ref 12.0–15.0)
Immature Granulocytes: 1 %
Lymphocytes Relative: 19 %
Lymphs Abs: 2.5 10*3/uL (ref 0.7–4.0)
MCH: 26.5 pg (ref 26.0–34.0)
MCHC: 30 g/dL (ref 30.0–36.0)
MCV: 88.4 fL (ref 78.0–100.0)
Monocytes Absolute: 1.3 10*3/uL — ABNORMAL HIGH (ref 0.1–1.0)
Monocytes Relative: 11 %
Neutro Abs: 8.8 10*3/uL — ABNORMAL HIGH (ref 1.7–7.7)
Neutrophils Relative %: 69 %
Platelets: 498 10*3/uL — ABNORMAL HIGH (ref 150–400)
RBC: 3.02 MIL/uL — ABNORMAL LOW (ref 3.87–5.11)
RDW: 15.8 % — ABNORMAL HIGH (ref 11.5–15.5)
WBC: 12.8 10*3/uL — ABNORMAL HIGH (ref 4.0–10.5)

## 2018-06-16 LAB — COMPREHENSIVE METABOLIC PANEL
ALT: 94 U/L — ABNORMAL HIGH (ref 0–44)
AST: 69 U/L — ABNORMAL HIGH (ref 15–41)
Albumin: 1.5 g/dL — ABNORMAL LOW (ref 3.5–5.0)
Alkaline Phosphatase: 80 U/L (ref 38–126)
Anion gap: 6 (ref 5–15)
BUN: 15 mg/dL (ref 8–23)
CO2: 22 mmol/L (ref 22–32)
Calcium: 7.3 mg/dL — ABNORMAL LOW (ref 8.9–10.3)
Chloride: 110 mmol/L (ref 98–111)
Creatinine, Ser: 1.02 mg/dL — ABNORMAL HIGH (ref 0.44–1.00)
GFR calc Af Amer: >60 mL/min (ref >60)
GFR calc non Af Amer: 56 mL/min — ABNORMAL LOW (ref >60)
Glucose, Bld: 119 mg/dL — ABNORMAL HIGH (ref 70–99)
Potassium: 4.1 mmol/L (ref 3.5–5.1)
Sodium: 138 mmol/L (ref 135–145)
Total Bilirubin: 0.4 mg/dL (ref 0.3–1.2)
Total Protein: 5.1 g/dL — ABNORMAL LOW (ref 6.5–8.1)

## 2018-06-16 LAB — TRIGLYCERIDES, BODY FLUIDS: Triglycerides, Fluid: 825 mg/dL

## 2018-06-16 SURGERY — WOUND EXPLORATION
Anesthesia: General | Laterality: Left

## 2018-06-16 MED ORDER — VANCOMYCIN HCL IN DEXTROSE 1-5 GM/200ML-% IV SOLN
INTRAVENOUS | Status: AC
  Filled 2018-06-16: qty 200

## 2018-06-16 MED ORDER — MIDAZOLAM HCL 2 MG/2ML IJ SOLN
INTRAMUSCULAR | Status: AC
  Filled 2018-06-16: qty 2

## 2018-06-16 MED ORDER — FENTANYL CITRATE (PF) 250 MCG/5ML IJ SOLN
INTRAMUSCULAR | Status: AC
  Filled 2018-06-16: qty 5

## 2018-06-16 MED ORDER — MEPERIDINE HCL 50 MG/ML IJ SOLN: 6.2500 mg | INTRAMUSCULAR | Status: DC | PRN

## 2018-06-16 MED ORDER — SUGAMMADEX SODIUM 200 MG/2ML IV SOLN
INTRAVENOUS | Status: DC | PRN
  Administered 2018-06-16: 200 mg via INTRAVENOUS

## 2018-06-16 MED ORDER — ONDANSETRON HCL 4 MG/2ML IJ SOLN
INTRAMUSCULAR | Status: DC | PRN
  Administered 2018-06-16: 4 mg via INTRAVENOUS

## 2018-06-16 MED ORDER — ROCURONIUM BROMIDE 100 MG/10ML IV SOLN
INTRAVENOUS | Status: DC | PRN
  Administered 2018-06-16: 50 mg via INTRAVENOUS

## 2018-06-16 MED ORDER — ONDANSETRON HCL 4 MG/2ML IJ SOLN: 4.0000 mg | Freq: Once | INTRAMUSCULAR | Status: DC | PRN

## 2018-06-16 MED ORDER — FENTANYL CITRATE (PF) 100 MCG/2ML IJ SOLN
INTRAMUSCULAR | Status: DC | PRN
  Administered 2018-06-16: 50 ug via INTRAVENOUS
  Administered 2018-06-16: 100 ug via INTRAVENOUS
  Administered 2018-06-16: 50 ug via INTRAVENOUS

## 2018-06-16 MED ORDER — PHENYLEPHRINE HCL 10 MG/ML IJ SOLN
INTRAMUSCULAR | Status: DC | PRN
  Administered 2018-06-16: 160 ug via INTRAVENOUS
  Administered 2018-06-16 (×2): 80 ug via INTRAVENOUS

## 2018-06-16 MED ORDER — SODIUM CHLORIDE 0.9 % IR SOLN
Status: DC | PRN
  Administered 2018-06-16: 1000 mL

## 2018-06-16 MED ORDER — MIDAZOLAM HCL 5 MG/5ML IJ SOLN
INTRAMUSCULAR | Status: DC | PRN
  Administered 2018-06-16: 2 mg via INTRAVENOUS

## 2018-06-16 MED ORDER — PROPOFOL 10 MG/ML IV BOLUS
INTRAVENOUS | Status: AC
  Filled 2018-06-16: qty 20

## 2018-06-16 MED ORDER — DEXAMETHASONE SODIUM PHOSPHATE 4 MG/ML IJ SOLN
INTRAMUSCULAR | Status: DC | PRN
  Administered 2018-06-16: 10 mg via INTRAVENOUS

## 2018-06-16 MED ORDER — ONDANSETRON HCL 4 MG/2ML IJ SOLN
INTRAMUSCULAR | Status: AC
  Filled 2018-06-16: qty 2

## 2018-06-16 MED ORDER — SODIUM CHLORIDE 0.9 % IV SOLN
INTRAVENOUS | Status: AC
  Filled 2018-06-16: qty 1.2

## 2018-06-16 MED ORDER — SODIUM CHLORIDE 0.9 % IV SOLN
INTRAVENOUS | Status: DC | PRN
  Administered 2018-06-16: 60 ug/min via INTRAVENOUS

## 2018-06-16 MED ORDER — DEXAMETHASONE SODIUM PHOSPHATE 10 MG/ML IJ SOLN
INTRAMUSCULAR | Status: AC
  Filled 2018-06-16: qty 1

## 2018-06-16 MED ORDER — HYDROMORPHONE HCL 1 MG/ML IJ SOLN: 0.2500 mg | INTRAMUSCULAR | Status: DC | PRN

## 2018-06-16 MED ORDER — MEDIUM CHAIN TRIGLYCERIDES PO OIL
15.0000 mL | TOPICAL_OIL | Freq: Three times a day (TID) | ORAL | Status: DC
  Administered 2018-06-16 – 2018-06-23 (×19): 15 mL via ORAL
  Filled 2018-06-16 (×22): qty 15

## 2018-06-16 MED ORDER — VANCOMYCIN HCL 1000 MG IV SOLR
INTRAVENOUS | Status: DC | PRN
  Administered 2018-06-16: 1000 mg via INTRAVENOUS

## 2018-06-16 MED ORDER — LIDOCAINE HCL (CARDIAC) PF 100 MG/5ML IV SOSY
PREFILLED_SYRINGE | INTRAVENOUS | Status: DC | PRN
  Administered 2018-06-16: 100 mg via INTRAVENOUS

## 2018-06-16 MED ORDER — LACTATED RINGERS IV SOLN
INTRAVENOUS | Status: DC | PRN
  Administered 2018-06-16: 14:00:00 via INTRAVENOUS

## 2018-06-16 SURGICAL SUPPLY — 52 items
BANDAGE ESMARK 6X9 LF (GAUZE/BANDAGES/DRESSINGS) IMPLANT
BNDG CMPR 9X6 STRL LF SNTH (GAUZE/BANDAGES/DRESSINGS)
BNDG ESMARK 6X9 LF (GAUZE/BANDAGES/DRESSINGS)
CANISTER SUCT 3000ML PPV (MISCELLANEOUS) ×2 IMPLANT
CATH THORACIC 28FR (CATHETERS) ×1 IMPLANT
CLIP VESOCCLUDE SM WIDE 24/CT (CLIP) ×1 IMPLANT
CUFF TOURNIQUET SINGLE 18IN (TOURNIQUET CUFF) IMPLANT
CUFF TOURNIQUET SINGLE 24IN (TOURNIQUET CUFF) IMPLANT
CUFF TOURNIQUET SINGLE 34IN LL (TOURNIQUET CUFF) IMPLANT
CUFF TOURNIQUET SINGLE 44IN (TOURNIQUET CUFF) IMPLANT
DRAIN CHANNEL 10M FLAT 3/4 FLT (DRAIN) ×1 IMPLANT
DRAIN CHANNEL 15F RND FF W/TCR (WOUND CARE) IMPLANT
DRSG COVADERM 4X8 (GAUZE/BANDAGES/DRESSINGS) ×1 IMPLANT
ELECT REM PT RETURN 9FT ADLT (ELECTROSURGICAL) ×2
ELECTRODE REM PT RTRN 9FT ADLT (ELECTROSURGICAL) ×1 IMPLANT
EVACUATOR SILICONE 100CC (DRAIN) ×1 IMPLANT
GAUZE SPONGE 4X4 12PLY STRL LF (GAUZE/BANDAGES/DRESSINGS) ×2 IMPLANT
GLOVE BIO SURGEON STRL SZ 6.5 (GLOVE) ×1 IMPLANT
GLOVE BIO SURGEON STRL SZ7.5 (GLOVE) ×3 IMPLANT
GLOVE BIOGEL PI IND STRL 7.0 (GLOVE) IMPLANT
GLOVE BIOGEL PI IND STRL 7.5 (GLOVE) IMPLANT
GLOVE BIOGEL PI INDICATOR 7.0 (GLOVE) ×2
GLOVE BIOGEL PI INDICATOR 7.5 (GLOVE) ×2
GOWN STRL REUS W/ TWL LRG LVL3 (GOWN DISPOSABLE) ×1 IMPLANT
GOWN STRL REUS W/ TWL XL LVL3 (GOWN DISPOSABLE) ×3 IMPLANT
GOWN STRL REUS W/TWL LRG LVL3 (GOWN DISPOSABLE) ×2
GOWN STRL REUS W/TWL XL LVL3 (GOWN DISPOSABLE) ×6
KIT BASIN OR (CUSTOM PROCEDURE TRAY) ×2 IMPLANT
KIT TURNOVER KIT B (KITS) ×2 IMPLANT
NS IRRIG 1000ML POUR BTL (IV SOLUTION) ×3 IMPLANT
PACK CV ACCESS (CUSTOM PROCEDURE TRAY) IMPLANT
PACK GENERAL/GYN (CUSTOM PROCEDURE TRAY) ×1 IMPLANT
PACK PERIPHERAL VASCULAR (CUSTOM PROCEDURE TRAY) IMPLANT
PACK UNIVERSAL I (CUSTOM PROCEDURE TRAY) ×1 IMPLANT
PAD ARMBOARD 7.5X6 YLW CONV (MISCELLANEOUS) ×4 IMPLANT
STAPLER VISISTAT 35W (STAPLE) ×1 IMPLANT
SUT ETHILON 3 0 PS 1 (SUTURE) ×1 IMPLANT
SUT MNCRL AB 4-0 PS2 18 (SUTURE) IMPLANT
SUT PROLENE 5 0 C 1 24 (SUTURE) IMPLANT
SUT PROLENE 6 0 BV (SUTURE) IMPLANT
SUT SILK  1 MH (SUTURE) ×1
SUT SILK 1 MH (SUTURE) IMPLANT
SUT VIC AB 2-0 CT1 27 (SUTURE)
SUT VIC AB 2-0 CT1 TAPERPNT 27 (SUTURE) IMPLANT
SUT VIC AB 3-0 SH 27 (SUTURE) ×2
SUT VIC AB 3-0 SH 27X BRD (SUTURE) IMPLANT
SYSTEM SAHARA CHEST DRAIN RE-I (WOUND CARE) ×1 IMPLANT
TAPE CLOTH SURG 4X10 WHT LF (GAUZE/BANDAGES/DRESSINGS) ×2 IMPLANT
TOWEL GREEN STERILE (TOWEL DISPOSABLE) ×2 IMPLANT
TRAY FOLEY MTR SLVR 16FR STAT (SET/KITS/TRAYS/PACK) IMPLANT
UNDERPAD 30X30 (UNDERPADS AND DIAPERS) ×2 IMPLANT
WATER STERILE IRR 1000ML POUR (IV SOLUTION) ×2 IMPLANT

## 2018-06-16 NOTE — Op Note (Addendum)
    Patient name: Samantha Stevenson MRN: [id number] DOB: [birth year] Sex: female  06/16/2018 Pre-operative Diagnosis: Thoracic intramural hematoma, lymphatic duct leak Post-operative diagnosis:  Same Surgeon:  Erlene Quan C. Donzetta Matters, MD Procedure Performed: 1.  Exploration left neck wound placement of 10 flat drain 2.  Left sided 2 French chest tube placement   Indications: 67 year old female recently underwent thoracic endograft and and left subclavian artery transposition.  Postoperatively she became tachycardic with relative hypotension was found to have pleural effusion that had recurred from preoperatively underwent drainage which was found to be milky in appearance.  This is consistent with emphatically can she is now indicated for expiration of her left neck placement of chest tube for definitive management of her lymphatic leak.  Findings: The left neck had minimal hematoma without any lymph fluid there is no identifiable draining lymphatic duct.  The left chest put out greater than 250 cc of milky appearing fluid through 28 French chest tube upon placement.  10 flat drain was placed in the neck incision was closed.  Chest tube on the left was hooked to water seal.  Lymphatic leak is either from intrapleural duct injury or retraction of the thoracic duct into the chest.   Procedure:  The patient was identified in the holding area and taken to the operating room where she was placed supine on the operating table and general anesthesia was induced.  She was sterilely prepped and draped the left neck and chest given antibiotics and a timeout was called.  We began by opening the previous neck incision where we encountered minimal hematoma no lymphatic fluid.  I irrigated the wound copiously identified all the identifiable structures she is Doppler to confirm adequate flow in the subclavian artery and carotid artery and then had expected signals.  As there is no lymphatic fluid could not identify any  lymphatic leak.  There is no lymphatic fluid leaking up through the previous rent in the pleura suggesting this is sealed.  This was most consistent with intrapleural leakage.  With this a transverse incision was made approximately the fifth intercostal space I dissected down to the level of the ribs and punctured one rib space above just over top of the rib and placed a 20 French chest tube.  I did hook this to suction initially and removed approximately 250 cc of milky appearing fluid.  This was then sterilely to waterseal.  I sutured this in place sterile dressing was placed.  Return to my attention to the neck where I further irrigated still did not identify any lymphatic fluid.  I made a counterincision just over the clavicle tunneled a 10 flat drain trimmed to size and placed it down overlying her subclavian artery stump.  I then closed the platysma with 3-0 nylon suture and the skin was stapled.  She was allowed awaken from anesthesia having tolerated procedure without immediate comp occasion.    EBL 20 cc.   Shawon Denzer C. Donzetta Matters, MD [facility] [place] Office: [phone] Pager: [phone]

## 2018-06-16 NOTE — Progress Notes (Signed)
  Progress Note    06/16/2018 9:36 AM 3 Days Post-Op  Subjective: She is more alert and oriented though her left arm hurting some today.  Vitals:   06/16/18 0741 06/16/18 0800  BP: 115/69 119/75  Pulse: (!) 117 (!) 118  Resp: (!) 27 (!) 22  Temp: 98.3 F (36.8 C)   SpO2: 97% 94%    Physical Exam: Awake alert oriented x3 Left neck incision with fluctuance Palpable left brachial and radial pulses Palpable dorsalis pedis pulses bilaterally Abdomen is soft   CBC    Component Value Date/Time   WBC 12.8 (H) 06/16/2018 0352   RBC 3.02 (L) 06/16/2018 0352   HGB 8.0 (L) 06/16/2018 0352   HGB 10.1 (L) 03/12/2018 1358   HCT 26.7 (L) 06/16/2018 0352   HCT 32.5 (L) 03/12/2018 1358   PLT 498 (H) 06/16/2018 0352   PLT 317 03/12/2018 1358   MCV 88.4 06/16/2018 0352   MCV 86 03/12/2018 1358   MCH 26.5 06/16/2018 0352   MCHC 30.0 06/16/2018 0352   RDW 15.8 (H) 06/16/2018 0352   RDW 15.9 (H) 03/12/2018 1358   LYMPHSABS 2.5 06/16/2018 0352   MONOABS 1.3 (H) 06/16/2018 0352   EOSABS 0.0 06/16/2018 0352   BASOSABS 0.0 06/16/2018 0352    BMET    Component Value Date/Time   NA 138 06/16/2018 0352   NA 141 03/12/2018 1358   K 4.1 06/16/2018 0352   CL 110 06/16/2018 0352   CO2 22 06/16/2018 0352   GLUCOSE 119 (H) 06/16/2018 0352   BUN 15 06/16/2018 0352   BUN 13 03/12/2018 1358   CREATININE 1.02 (H) 06/16/2018 0352   CALCIUM 7.3 (L) 06/16/2018 0352   GFRNONAA 56 (L) 06/16/2018 0352   GFRAA >60 06/16/2018 0352    INR    Component Value Date/Time   INR 1.30 06/13/2018 0228     Intake/Output Summary (Last 24 hours) at 06/16/2018 0937 Last data filed at 06/16/2018 0600 Gross per 24 hour  Intake 2523.57 ml  Output 900 ml  Net 1623.57 ml   Chest x-ray reviewed reveals recurrent left pleural effusion  Assessment:  67 y.o. female is s/p left subclavian transposition with thoracic endograft.  She underwent ultrasound guided thoracentesis yesterday for recurrent pleural  effusion which demonstrated no acute fluid consistent with lymphatic leak.   Plan: I discussed with the patient and her daughter that she has a lymphatic leak likely arising from her left neck draining into her left chest from her radius rent in her pleura.  We will take her to the operating room today to explore her left neck incision hopefully find the source and seal it.  We will also place a left-sided chest tube to keep her drain.  Yesterday she demonstrates good understanding and is that she is n.p.o. ready for surgery.   Camille Dragan C. Donzetta Matters, MD [facility] [place] Office: [phone] Pager: [phone]  06/16/2018 9:37 AM

## 2018-06-16 NOTE — Anesthesia Preprocedure Evaluation (Signed)
Anesthesia Evaluation  Patient identified by MRN, date of birth, ID band Patient awake    Reviewed: Allergy & Precautions, NPO status , Patient's Chart, lab work & pertinent test results  Airway Mallampati: I  TM Distance: >3 FB Neck ROM: Full    Dental   Pulmonary    Pulmonary exam normal        Cardiovascular hypertension, Pt. on medications + Past MI  Normal cardiovascular exam+ dysrhythmias Atrial Fibrillation      Neuro/Psych    GI/Hepatic   Endo/Other    Renal/GU      Musculoskeletal   Abdominal   Peds  Hematology   Anesthesia Other Findings   Reproductive/Obstetrics                             Anesthesia Physical Anesthesia Plan  ASA: III  Anesthesia Plan: General   Post-op Pain Management:    Induction: Intravenous  PONV Risk Score and Plan: 3 and Ondansetron, Midazolam and Treatment may vary due to age or medical condition  Airway Management Planned: LMA  Additional Equipment:   Intra-op Plan:   Post-operative Plan: Extubation in OR  Informed Consent: I have reviewed the patients History and Physical, chart, labs and discussed the procedure including the risks, benefits and alternatives for the proposed anesthesia with the patient or authorized representative who has indicated his/her understanding and acceptance.     Plan Discussed with: CRNA and Surgeon  Anesthesia Plan Comments:         Anesthesia Quick Evaluation

## 2018-06-16 NOTE — Transfer of Care (Signed)
Immediate Anesthesia Transfer of Care Note  Patient: Samantha Stevenson  Procedure(s) Performed: LEFT NECK WOUND EXPLORATION, CHEST TUBE INSERTION (Left )  Patient Location: PACU  Anesthesia Type:General  Level of Consciousness: awake, alert  and oriented  Airway & Oxygen Therapy: Patient Spontanous Breathing and Patient connected to nasal cannula oxygen  Post-op Assessment: Report given to RN, Post -op Vital signs reviewed and stable and Patient moving all extremities  Post vital signs: Reviewed and stable  Last Vitals:  Vitals Value Taken Time  BP 131/77 06/16/2018  3:11 PM  Temp    Pulse 101 06/16/2018  3:19 PM  Resp 17 06/16/2018  3:19 PM  SpO2 99 % 06/16/2018  3:19 PM  Vitals shown include unvalidated device data.  Last Pain:  Vitals:   06/16/18 1200  TempSrc:   PainSc: 3       Patients Stated Pain Goal: 0 (27/07/86 7544)  Complications: No apparent anesthesia complications

## 2018-06-16 NOTE — Anesthesia Postprocedure Evaluation (Signed)
Anesthesia Post Note  Patient: Samantha Stevenson  Procedure(s) Performed: LEFT NECK WOUND EXPLORATION, CHEST TUBE INSERTION (Left )     Patient location during evaluation: PACU Anesthesia Type: General Level of consciousness: awake and alert Pain management: pain level controlled Vital Signs Assessment: post-procedure vital signs reviewed and stable Respiratory status: spontaneous breathing, nonlabored ventilation, respiratory function stable and patient connected to nasal cannula oxygen Cardiovascular status: blood pressure returned to baseline and stable Postop Assessment: no apparent nausea or vomiting Anesthetic complications: no    Last Vitals:  Vitals:   06/16/18 1715 06/16/18 1730  BP: 125/62 90/60  Pulse:  (!) 104  Resp: (!) 28 (!) 27  Temp:    SpO2:  98%    Last Pain:  Vitals:   06/16/18 1715  TempSrc:   PainSc: 10-Worst pain ever                 Rasean Joos DAVID

## 2018-06-16 NOTE — Plan of Care (Signed)
  Problem: Coping: Goal: Level of anxiety will decrease Outcome: Progressing   Problem: Safety: Goal: Ability to remain free from injury will improve Outcome: Progressing   Problem: Skin Integrity: Goal: Risk for impaired skin integrity will decrease Outcome: Progressing   Problem: Respiratory: Goal: Respiratory status will improve Outcome: Progressing

## 2018-06-16 NOTE — Anesthesia Procedure Notes (Signed)
Procedure Name: Intubation Date/Time: 06/16/2018 1:58 PM Performed by: Seven Marengo T, CRNA Pre-anesthesia Checklist: Patient identified, Emergency Drugs available, Suction available and Patient being monitored Patient Re-evaluated:Patient Re-evaluated prior to induction Oxygen Delivery Method: Circle system utilized Preoxygenation: Pre-oxygenation with 100% oxygen Induction Type: IV induction Ventilation: Mask ventilation without difficulty Laryngoscope Size: Mac and 3 Grade View: Grade I Tube type: Oral Tube size: 7.5 mm Number of attempts: 1 Airway Equipment and Method: Patient positioned with wedge pillow and Stylet Placement Confirmation: ETT inserted through vocal cords under direct vision,  positive ETCO2 and breath sounds checked- equal and bilateral Secured at: 21 cm Tube secured with: Tape Dental Injury: Teeth and Oropharynx as per pre-operative assessment

## 2018-06-16 NOTE — Progress Notes (Signed)
Notified Dr. Donzetta Matters of assessment findings

## 2018-06-17 ENCOUNTER — Inpatient Hospital Stay (HOSPITAL_COMMUNITY): Payer: Medicare Other

## 2018-06-17 ENCOUNTER — Inpatient Hospital Stay: Payer: Self-pay

## 2018-06-17 ENCOUNTER — Encounter (HOSPITAL_COMMUNITY): Payer: Self-pay | Admitting: Vascular Surgery

## 2018-06-17 LAB — TYPE AND SCREEN
ABO/RH(D): B POS
Antibody Screen: NEGATIVE
Unit division: 0
Unit division: 0

## 2018-06-17 LAB — BASIC METABOLIC PANEL
Anion gap: 3 — ABNORMAL LOW (ref 5–15)
BUN: 12 mg/dL (ref 8–23)
CO2: 24 mmol/L (ref 22–32)
Calcium: 7.3 mg/dL — ABNORMAL LOW (ref 8.9–10.3)
Chloride: 108 mmol/L (ref 98–111)
Creatinine, Ser: 1 mg/dL (ref 0.44–1.00)
GFR calc Af Amer: >60 mL/min (ref >60)
GFR calc non Af Amer: 58 mL/min — ABNORMAL LOW (ref >60)
Glucose, Bld: 112 mg/dL — ABNORMAL HIGH (ref 70–99)
Potassium: 4.2 mmol/L (ref 3.5–5.1)
Sodium: 135 mmol/L (ref 135–145)

## 2018-06-17 LAB — BPAM RBC
Blood Product Expiration Date: 201909242359
Blood Product Expiration Date: 201909262359
ISSUE DATE / TIME: 201908221516
ISSUE DATE / TIME: 201908221516
Unit Type and Rh: 7300
Unit Type and Rh: 7300

## 2018-06-17 LAB — CBC
HCT: 26.6 % — ABNORMAL LOW (ref 36.0–46.0)
Hemoglobin: 8.1 g/dL — ABNORMAL LOW (ref 12.0–15.0)
MCH: 27 pg (ref 26.0–34.0)
MCHC: 30.5 g/dL (ref 30.0–36.0)
MCV: 88.7 fL (ref 78.0–100.0)
Platelets: 500 10*3/uL — ABNORMAL HIGH (ref 150–400)
RBC: 3 MIL/uL — ABNORMAL LOW (ref 3.87–5.11)
RDW: 15.9 % — ABNORMAL HIGH (ref 11.5–15.5)
WBC: 13.9 10*3/uL — ABNORMAL HIGH (ref 4.0–10.5)

## 2018-06-17 MED ORDER — ADULT MULTIVITAMIN W/MINERALS CH
1.0000 | ORAL_TABLET | Freq: Every day | ORAL | Status: DC
  Administered 2018-06-18 – 2018-06-23 (×6): 1 via ORAL
  Filled 2018-06-17 (×6): qty 1

## 2018-06-17 MED ORDER — BOOST / RESOURCE BREEZE PO LIQD CUSTOM
1.0000 | Freq: Three times a day (TID) | ORAL | Status: DC
  Administered 2018-06-17 – 2018-06-22 (×7): 1 via ORAL

## 2018-06-17 MED ORDER — PRO-STAT SUGAR FREE PO LIQD
30.0000 mL | Freq: Three times a day (TID) | ORAL | Status: DC
  Filled 2018-06-17 (×6): qty 30

## 2018-06-17 NOTE — Progress Notes (Signed)
Spoke with Elink and Agricultural consultant regarding increased OP form JP. L arm swelling improving. Family noticed significant change. Will continue to closely monitor JP and chest tube drainage.

## 2018-06-17 NOTE — Progress Notes (Addendum)
Progress Note    06/17/2018 9:31 AM 1 Day Post-Op  Subjective:  Says her stomach is bothering her; says it is the same issues with constipation.  Chest and back pain better.  Pain at the chest tube site.  Afebrile  HR 100's ST 235'T-732'K systolic 02% RA  Vitals:   06/17/18 0700 06/17/18 0800  BP: 128/63 (!) 116/58  Pulse: 96 (!) 108  Resp: 19 (!) 24  Temp: 98.5 F (36.9 C)   SpO2: 95% 95%    Physical Exam: Cardiac:  regular Lungs:  Non labored Incisions:  Left neck incision is clean; bandage saturated with serous drainage; right groin incision is clean and dry. Extremities:  Easily palpable left radial pulse as well as bilateral DP pulses.  Right leg with more swelling than left that is her baseline; bilateral calves are soft and non tender.  Abdomen:  Soft; non tender with audible bowel sounds.    CBC    Component Value Date/Time   WBC 13.9 (H) 06/17/2018 0550   RBC 3.00 (L) 06/17/2018 0550   HGB 8.1 (L) 06/17/2018 0550   HGB 10.1 (L) 03/12/2018 1358   HCT 26.6 (L) 06/17/2018 0550   HCT 32.5 (L) 03/12/2018 1358   PLT 500 (H) 06/17/2018 0550   PLT 317 03/12/2018 1358   MCV 88.7 06/17/2018 0550   MCV 86 03/12/2018 1358   MCH 27.0 06/17/2018 0550   MCHC 30.5 06/17/2018 0550   RDW 15.9 (H) 06/17/2018 0550   RDW 15.9 (H) 03/12/2018 1358   LYMPHSABS 2.5 06/16/2018 0352   MONOABS 1.3 (H) 06/16/2018 0352   EOSABS 0.0 06/16/2018 0352   BASOSABS 0.0 06/16/2018 0352    BMET    Component Value Date/Time   NA 135 06/17/2018 0550   NA 141 03/12/2018 1358   K 4.2 06/17/2018 0550   CL 108 06/17/2018 0550   CO2 24 06/17/2018 0550   GLUCOSE 112 (H) 06/17/2018 0550   BUN 12 06/17/2018 0550   BUN 13 03/12/2018 1358   CREATININE 1.00 06/17/2018 0550   CALCIUM 7.3 (L) 06/17/2018 0550   GFRNONAA 58 (L) 06/17/2018 0550   GFRAA >60 06/17/2018 0550    INR    Component Value Date/Time   INR 1.30 06/13/2018 0228     Intake/Output Summary (Last 24 hours) at  06/17/2018 0931 Last data filed at 06/17/2018 0800 Gross per 24 hour  Intake 2722.37 ml  Output 2545 ml  Net 177.37 ml   Left neck JP drain: 400cc yesterday; 65cc earlier this am and 75cc emptied just now. CT output:  155cc/24 hrs   Assessment:  67 y.o. female is s/p:  Procedure Performed: 1.  Left subclavian to carotid artery transposition 2.  Percutaneous access right common femoral artery 3.  Thoracic endovascular endo-grafting with left subclavian artery coverage with 28 x 28 x 15 cm distal and 34 x 34 x 20 cm proximal Gore CTAG 4.  Left common carotid artery stent with 7 x 40 mm Innova distal an 8 x 40 mm Innova proximally 5.  Right common femoral endarterectomy with greater saphenous vein patch angioplasty 6.  Intravascular ultrasound thoracic aorta. 4 Days Post-Op and  Placement of 7 French left common femoral central venous line with ultrasound guidance 2 Days Post op And 1.  Exploration left neck wound placement of 10 flat drain 2.  Left sided 28 French chest tube placement 1 Day Post-Op   Plan: -pt with palpable left radial and bilateral DP pulses -right leg with  more swelling than left that is baseline from previous DVT-calves are non tender bilaterally. -left neck drain continues with moderate output-leave drain.  Plan is also to leave chest tube in today.  Left neck dressing saturated.  Change as needed and use minimal tape (discussed with RN) -chest and back pain improved -creatinine normal with good UOP -hgb stable and pt tolerating Slight increase in leukocytosis-may be periop but need to monitor.  Pt is afebrile.  -oob today to chair-still has left femoral central venous line-CT recommends considering a PICC if needed.  -DVT prophylaxis:  Sq heparin  -Dr. Donzetta Matters to be by to see pt later today   Leontine Locket, PA-C [facility] [phone] 06/17/2018 9:31 AM   I have independently interviewed and examined patient and agree with PA  assessment and plan above.  Drainage and chest tube and JP appears to be slowing down and does not appear overtly chylous.  We will plan to get PICC line to get the left femoral line out and this order has been placed.  Continue low-fat diet with medium chain triglyceride supplementation.  Out of bed as tolerated.  If she remains hemodynamically stable can likely transfer out of ICU soon.  Cherisa Brucker C. Donzetta Matters, MD [facility] [place] Office: [phone] Pager: [phone]

## 2018-06-17 NOTE — Plan of Care (Signed)
Patient up to chair today, did well and was able to do most of the work on her own. She does complain about some rib pain and shortness of breath with activity but she recovers quickly and does not give up. Patient is currently on room air and doing well. Appetite is slowly improving, she prefers a vegetarian diet, she will drink the very berry breeze drinks by boost but refuses the prostat. JP drain continues to put out a significant amount of fluid, chest tube output is minimal for the shift.

## 2018-06-17 NOTE — Progress Notes (Signed)
Nutrition Follow-up  DOCUMENTATION CODES:   Not applicable  INTERVENTION:   Boost Breeze po TID, each supplement provides 250 kcal and 9 grams of protein. This supplement is Fat Free  30 ml Prostat TID, each supplement provides 100 kcals and 15 grams protein.  This supplement is Fat Free  Discussed menu options with patient in order to keep fat grams to less than 10 g/day  Add MVI with minerals daily  NUTRITION DIAGNOSIS:   Inadequate oral intake related to decreased appetite as evidenced by meal completion < 25%, per patient/family report.  Continues  GOAL:   Patient will meet greater than or equal to 90% of their needs  Not Met  MONITOR:   PO intake, Supplement acceptance, Labs, Weight trends, Skin, I & O's  REASON FOR ASSESSMENT:   Consult Assessment of nutrition requirement/status, Poor PO  ASSESSMENT:   67 year old hypertensive nondiabetic female presents with sudden onset of epigastric and back pain.  CTA performed in the ED demonstrated a thoracic aortic intramural hematoma extending from the left subclavian artery to the visceral vessels.  She has had associated nausea and vomiting.   8/09 Thoracentesis due to pleural effusion 8/22 Left subclavian transposition and thoracic endograft  8/25 Thoracic intramural hematoma with lymphatic duct leak with placement of left sided chest tube, exploration of left neck wound with drain placement  MCT oil 15 mL TID ordered by MD; pt placed on No Fat/Very Low Fat diet  Chest tube: 155 mL/24 hours Left neck JP drain: 400 mL yesterday, 140 mL today thus far  Pt very picky eater, pt also indicates that she is "Vegetarian" but then goes on to say that she normally eats chicken, [place] and fish. Pt reports she eats cream of wheat every morning for breakfast. Pt unable to tell writer much detail about what she typically eats for lunch. Reviewed items that are on current diet (No Fat/Very Low fat for chyle leak) that are  compliant with diet. Pt very picky; reports there is only 1 type of cereal in the world she will eat (Special K Cinnamon Cluster), she wont eat any other kind. Pt reports she will eat yogurt but only cherry (we do not have cherry, only non fat strawberry). Pt likes vegetables but not they way the hospital prepares them. Pt does like fruit. Attempted to obtain meal orders as best as possible  Discussed limited protein supplement options as they need to be fat free. Options are Boost Breeze and Pro-Stat at present time; current diet also low in protein.  In order for pt to meet protein needs to account for losses via drainage, pt will need to take both. Reinforced importance of adequate protein.   Labs: albumin 1.5, corrected calcium 9.3  Meds: lasix, MCT oil, colace, dulcolax prn, sorbitol prn,  KCL  Diet Order:   Diet Order            Diet Heart Room service appropriate? Yes; Fluid consistency: Thin  Diet effective now              EDUCATION NEEDS:   Education needs have been addressed  Skin:  Skin Assessment: Reviewed RN Assessment  Last BM:  8/21  Height:   Ht Readings from Last 1 Encounters:  06/13/18 5' 4.02" (1.626 m)    Weight:   Wt Readings from Last 1 Encounters:  06/13/18 70.7 kg    Ideal Body Weight:  54.5 kg  BMI:  Body mass index is 26.74 kg/m.  Estimated Nutritional  Needs:   Kcal:  1800-2000  Protein:  90-105 grams  Fluid:  1.8-2.0 L   Kerman Passey MS, RD, LDN, CNSC [phone] Pager  [phone] Weekend/On-Call Pager

## 2018-06-17 NOTE — Progress Notes (Signed)
Patient ID: Samantha Stevenson, female   DOB: [birth year], 67 y.o.   MRN: [id number] [facility] Evening Rounds:  Hemodynamically stable sats 97% Urine output good.  Neck drain output remains significant Chest tube output minimal.

## 2018-06-17 NOTE — Progress Notes (Signed)
1 Day Post-Op Procedure(s) (LRB): LEFT NECK WOUND EXPLORATION, CHEST TUBE INSERTION (Left) Subjective: Complaining of left chest pain at chest tube site Minimal chest tube drainage overnight, breathing comfortably on room air Left neck JP drain remains thin, significant Stable blood pressure, sinus rhythm  Objective: Vital signs in last 24 hours: Temp:  [97.7 F (36.5 C)-98.8 F (37.1 C)] 98.5 F (36.9 C) (08/26 0700) Pulse Rate:  [95-116] 108 (08/26 0800) Cardiac Rhythm: Sinus tachycardia (08/26 0825) Resp:  [18-32] 24 (08/26 0800) BP: (90-136)/(43-79) 116/58 (08/26 0800) SpO2:  [93 %-100 %] 95 % (08/26 0800)  Hemodynamic parameters for last 24 hours:  Stable  Intake/Output from previous day: 08/25 0701 - 08/26 0700 In: 2800.4 [I.V.:2800.4] Out: 3105 [Urine:1950; Drains:400; Blood:100; Chest Tube:155] Intake/Output this shift: Total I/O In: 220 [P.O.:120; I.V.:100] Out: 90 [Drains:90]  Alert and comfortable Lungs clear Mild edema of extremities, extremities warm well perfused Neuro intact   Lab Results: Recent Labs    06/16/18 0352 06/17/18 0550  WBC 12.8* 13.9*  HGB 8.0* 8.1*  HCT 26.7* 26.6*  PLT 498* 500*   BMET:  Recent Labs    06/16/18 0352 06/17/18 0550  NA 138 135  K 4.1 4.2  CL 110 108  CO2 22 24  GLUCOSE 119* 112*  BUN 15 12  CREATININE 1.02* 1.00  CALCIUM 7.3* 7.3*    PT/INR: No results for input(s): LABPROT, INR in the last 72 hours. ABG    Component Value Date/Time   TCO2 20 (L) 06/15/2018 1125   CBG (last 3)  No results for input(s): GLUCAP in the last 72 hours.  Assessment/Plan: S/P Procedure(s) (LRB): LEFT NECK WOUND EXPLORATION, CHEST TUBE INSERTION (Left) Left pleural effusion-chylothorax is improved Continue with low-fat diet, leave tube in for now Consider placement of right arm PICC line and removal of left femoral catheter so patient can be mobilized Chest x-ray in a.m.  LOS: 20 days    Samantha Stevenson  III 06/17/2018

## 2018-06-18 ENCOUNTER — Inpatient Hospital Stay (HOSPITAL_COMMUNITY): Payer: Medicare Other

## 2018-06-18 ENCOUNTER — Encounter (HOSPITAL_COMMUNITY): Payer: Self-pay | Admitting: Vascular Surgery

## 2018-06-18 LAB — CBC WITH DIFFERENTIAL/PLATELET
Abs Immature Granulocytes: 0.1 10*3/uL (ref 0.0–0.1)
Basophils Absolute: 0.1 10*3/uL (ref 0.0–0.1)
Basophils Relative: 0 %
Eosinophils Absolute: 0.2 10*3/uL (ref 0.0–0.7)
Eosinophils Relative: 2 %
HCT: 26.1 % — ABNORMAL LOW (ref 36.0–46.0)
Hemoglobin: 7.8 g/dL — ABNORMAL LOW (ref 12.0–15.0)
Immature Granulocytes: 1 %
Lymphocytes Relative: 24 %
Lymphs Abs: 2.7 10*3/uL (ref 0.7–4.0)
MCH: 26.7 pg (ref 26.0–34.0)
MCHC: 29.9 g/dL — ABNORMAL LOW (ref 30.0–36.0)
MCV: 89.4 fL (ref 78.0–100.0)
Monocytes Absolute: 0.8 10*3/uL (ref 0.1–1.0)
Monocytes Relative: 7 %
Neutro Abs: 7.4 10*3/uL (ref 1.7–7.7)
Neutrophils Relative %: 66 %
Platelets: 544 10*3/uL — ABNORMAL HIGH (ref 150–400)
RBC: 2.92 MIL/uL — ABNORMAL LOW (ref 3.87–5.11)
RDW: 15.9 % — ABNORMAL HIGH (ref 11.5–15.5)
WBC: 11.3 10*3/uL — ABNORMAL HIGH (ref 4.0–10.5)

## 2018-06-18 LAB — BASIC METABOLIC PANEL
Anion gap: 6 (ref 5–15)
BUN: 10 mg/dL (ref 8–23)
CO2: 22 mmol/L (ref 22–32)
Calcium: 7 mg/dL — ABNORMAL LOW (ref 8.9–10.3)
Chloride: 111 mmol/L (ref 98–111)
Creatinine, Ser: 0.86 mg/dL (ref 0.44–1.00)
GFR calc Af Amer: >60 mL/min (ref >60)
GFR calc non Af Amer: >60 mL/min (ref >60)
Glucose, Bld: 103 mg/dL — ABNORMAL HIGH (ref 70–99)
Potassium: 3.5 mmol/L (ref 3.5–5.1)
Sodium: 139 mmol/L (ref 135–145)

## 2018-06-18 LAB — PATHOLOGIST SMEAR REVIEW

## 2018-06-18 MED ORDER — SODIUM CHLORIDE 0.9% FLUSH: 10.0000 mL | INTRAVENOUS | Status: DC | PRN

## 2018-06-18 MED ORDER — SODIUM CHLORIDE 0.9% FLUSH
10.0000 mL | Freq: Two times a day (BID) | INTRAVENOUS | Status: DC
  Administered 2018-06-19 – 2018-06-23 (×5): 10 mL

## 2018-06-18 NOTE — Progress Notes (Signed)
  Progress Note    06/18/2018 1:28 PM 2 Days Post-Op  Subjective: She is generally feeling better with improved chest and abdominal pain  Vitals:   06/18/18 1200 06/18/18 1300  BP:    Pulse: 95 99  Resp: (!) 26   Temp:    SpO2: 94% 97%    Physical Exam: Awake alert oriented Left neck incision clean dry intact with staples JP drain with milky fluid Abdomen is soft nontender nondistended Right groin incision clean dry intact Palpable left radial pulse as well as bilateral dorsalis pedis pulses  CBC    Component Value Date/Time   WBC 11.3 (H) 06/18/2018 0437   RBC 2.92 (L) 06/18/2018 0437   HGB 7.8 (L) 06/18/2018 0437   HGB 10.1 (L) 03/12/2018 1358   HCT 26.1 (L) 06/18/2018 0437   HCT 32.5 (L) 03/12/2018 1358   PLT 544 (H) 06/18/2018 0437   PLT 317 03/12/2018 1358   MCV 89.4 06/18/2018 0437   MCV 86 03/12/2018 1358   MCH 26.7 06/18/2018 0437   MCHC 29.9 (L) 06/18/2018 0437   RDW 15.9 (H) 06/18/2018 0437   RDW 15.9 (H) 03/12/2018 1358   LYMPHSABS 2.7 06/18/2018 0437   MONOABS 0.8 06/18/2018 0437   EOSABS 0.2 06/18/2018 0437   BASOSABS 0.1 06/18/2018 0437    BMET    Component Value Date/Time   NA 139 06/18/2018 0437   NA 141 03/12/2018 1358   K 3.5 06/18/2018 0437   CL 111 06/18/2018 0437   CO2 22 06/18/2018 0437   GLUCOSE 103 (H) 06/18/2018 0437   BUN 10 06/18/2018 0437   BUN 13 03/12/2018 1358   CREATININE 0.86 06/18/2018 0437   CALCIUM 7.0 (L) 06/18/2018 0437   GFRNONAA >60 06/18/2018 0437   GFRAA >60 06/18/2018 0437    INR    Component Value Date/Time   INR 1.30 06/13/2018 0228     Intake/Output Summary (Last 24 hours) at 06/18/2018 1328 Last data filed at 06/18/2018 1300 Gross per 24 hour  Intake 2949.19 ml  Output 1821 ml  Net 1128.19 ml      Assessment:  67 y.o. female is s/p:  Procedure Performed: 1.Left subclavian to carotid artery transposition 2.Percutaneous access right common femoral artery 3.Thoracic endovascular  endo-grafting with left subclavian artery coverage with 28 x 28 x 15 cm distal and 34 x 34 x 20 cm proximal Gore CTAG 4.Left common carotid artery stent with 7 x 40 mmInnovadistal an 8 x 40 mm Innova proximally 5.Right common femoral endarterectomy with greater saphenous vein patch angioplasty 6.Intravascular ultrasound thoracic aorta.   - Placement of 7 French left common femoral central venous line with ultrasound guidance   1.Exploration left neck wound placement of 10 flat drain 2.Left sided 28 French chest tube placement    Plan: Chest tube output decreasing but JP drain increasing We will keep drains today Check chest x-ray in morning Can likely transfer to stepdown today or tomorrow Midline to be placed today DVT prophylaxis subq heparin.   Luma Clopper C. Donzetta Matters, MD [facility] [place] Office: [phone] Pager: [phone]  06/18/2018 1:28 PM

## 2018-06-19 LAB — BODY FLUID CULTURE: Culture: NO GROWTH

## 2018-06-19 LAB — CBC
HCT: 23.6 % — ABNORMAL LOW (ref 36.0–46.0)
Hemoglobin: 7.3 g/dL — ABNORMAL LOW (ref 12.0–15.0)
MCH: 27 pg (ref 26.0–34.0)
MCHC: 30.9 g/dL (ref 30.0–36.0)
MCV: 87.4 fL (ref 78.0–100.0)
Platelets: 407 10*3/uL — ABNORMAL HIGH (ref 150–400)
RBC: 2.7 MIL/uL — ABNORMAL LOW (ref 3.87–5.11)
RDW: 16.1 % — ABNORMAL HIGH (ref 11.5–15.5)
WBC: 9.5 10*3/uL (ref 4.0–10.5)

## 2018-06-19 LAB — BASIC METABOLIC PANEL
Anion gap: 14 (ref 5–15)
BUN: 12 mg/dL (ref 8–23)
CO2: 20 mmol/L — ABNORMAL LOW (ref 22–32)
Calcium: 9.5 mg/dL (ref 8.9–10.3)
Chloride: 106 mmol/L (ref 98–111)
Creatinine, Ser: 1.01 mg/dL — ABNORMAL HIGH (ref 0.44–1.00)
GFR calc Af Amer: >60 mL/min (ref >60)
GFR calc non Af Amer: 57 mL/min — ABNORMAL LOW (ref >60)
Glucose, Bld: 167 mg/dL — ABNORMAL HIGH (ref 70–99)
Potassium: 3.9 mmol/L (ref 3.5–5.1)
Sodium: 140 mmol/L (ref 135–145)

## 2018-06-19 MED ORDER — POTASSIUM CHLORIDE CRYS ER 20 MEQ PO TBCR: 20.0000 meq | EXTENDED_RELEASE_TABLET | ORAL | Status: DC

## 2018-06-19 NOTE — Progress Notes (Signed)
Chaplain presented to the patient's room, this patient known by the Chaplain. Ministry of listening presence provided for this patient as she shared some of her medical journey for this hospitalization. She was both tearful, but grateful for all of the medical miracles she has endured. Chaplain offered encouragement, prayer of continued healing and wellbeing, and a promise to continue to provide spiritual and emotional support. Chaplain Yaakov Guthrie [phone]

## 2018-06-19 NOTE — Progress Notes (Addendum)
Progress Note    06/19/2018 10:29 AM 3 Days Post-Op  Subjective:  C/o pain at chest tube insertion site, otherwise, feels ok   Afebrile HR 80's-110's NSR/ST 21'J-941'D systolic 40% RA  Vitals:   06/19/18 0900 06/19/18 1000  BP: (!) 99/47 114/65  Pulse: (!) 105 87  Resp: (!) 23 (!) 23  Temp:    SpO2: 95% 97%    Physical Exam: Cardiac:  regular Lungs:  Non labored Incisions:  Right groin is clean and dry; bandage just changed on left neck incision-bandage is clean and dry Extremities:  Bilateral feet are warm and well perfused.    CBC    Component Value Date/Time   WBC 9.5 06/19/2018 0529   RBC 2.70 (L) 06/19/2018 0529   HGB 7.3 (L) 06/19/2018 0529   HGB 10.1 (L) 03/12/2018 1358   HCT 23.6 (L) 06/19/2018 0529   HCT 32.5 (L) 03/12/2018 1358   PLT 407 (H) 06/19/2018 0529   PLT 317 03/12/2018 1358   MCV 87.4 06/19/2018 0529   MCV 86 03/12/2018 1358   MCH 27.0 06/19/2018 0529   MCHC 30.9 06/19/2018 0529   RDW 16.1 (H) 06/19/2018 0529   RDW 15.9 (H) 03/12/2018 1358   LYMPHSABS 2.7 06/18/2018 0437   MONOABS 0.8 06/18/2018 0437   EOSABS 0.2 06/18/2018 0437   BASOSABS 0.1 06/18/2018 0437    BMET    Component Value Date/Time   NA 140 06/19/2018 0529   NA 141 03/12/2018 1358   K 3.9 06/19/2018 0529   CL 106 06/19/2018 0529   CO2 20 (L) 06/19/2018 0529   GLUCOSE 167 (H) 06/19/2018 0529   BUN 12 06/19/2018 0529   BUN 13 03/12/2018 1358   CREATININE 1.01 (H) 06/19/2018 0529   CALCIUM 9.5 06/19/2018 0529   GFRNONAA 57 (L) 06/19/2018 0529   GFRAA >60 06/19/2018 0529    INR    Component Value Date/Time   INR 1.30 06/13/2018 0228     Intake/Output Summary (Last 24 hours) at 06/19/2018 1029 Last data filed at 06/19/2018 1021 Gross per 24 hour  Intake 2530.19 ml  Output 1860 ml  Net 670.19 ml     Assessment:  67 y.o. female is s/p:  Procedure Performed: 1.Left subclavian to carotid artery transposition 2.Percutaneous access right common femoral  artery 3.Thoracic endovascular endo-grafting with left subclavian artery coverage with 28 x 28 x 15 cm distal and 34 x 34 x 20 cm proximal Gore CTAG 4.Left common carotid artery stent with 7 x 40 mmInnovadistal an 8 x 40 mm Innova proximally 5.Right common femoral endarterectomy with greater saphenous vein patch angioplasty 6.Intravascular ultrasound thoracic aorta. 6 Days Post-Op and  Placement of 7 French left common femoral central venous line with ultrasound guidance 4 Days Post op And 1.Exploration left neck wound placement of 10 flat drain 2.Left sided 28 French chest tube placement  3 Days Post-Op   Plan: -pt doing well this morning -left neck drain with decreasing drainage down to 55cc/24hr -chest tube also with decreasing drainage at 130cc and no air leak, likely remove tomorrow. Total output currently at 550cc -acute blood loss anemia with hgb at 7.3 down from 7.8 yesterday.  Pt with normal EF of 60%.  Will discuss if pt needs transfusion with Dr. Donzetta Matters -incision in right groin looks good -increase mobilization today -transfer to stepdown -DVT prophylaxis:  Sq heparin   Leontine Locket, PA-C [facility] [phone] 06/19/2018 10:29 AM   I have independently examined and interviewed patient  and agree with PA assessment and plan above.   Exander Shaul C. Donzetta Matters, MD [facility] [place] Office: [phone] Pager: [phone]

## 2018-06-20 ENCOUNTER — Inpatient Hospital Stay (HOSPITAL_COMMUNITY): Payer: Medicare Other

## 2018-06-20 LAB — BASIC METABOLIC PANEL
Anion gap: 7 (ref 5–15)
BUN: 5 mg/dL — ABNORMAL LOW (ref 8–23)
CO2: 25 mmol/L (ref 22–32)
Calcium: 7.6 mg/dL — ABNORMAL LOW (ref 8.9–10.3)
Chloride: 105 mmol/L (ref 98–111)
Creatinine, Ser: 0.8 mg/dL (ref 0.44–1.00)
GFR calc Af Amer: >60 mL/min (ref >60)
GFR calc non Af Amer: >60 mL/min (ref >60)
Glucose, Bld: 106 mg/dL — ABNORMAL HIGH (ref 70–99)
Potassium: 3.6 mmol/L (ref 3.5–5.1)
Sodium: 137 mmol/L (ref 135–145)

## 2018-06-20 LAB — CBC
HCT: 19.8 % — ABNORMAL LOW (ref 36.0–46.0)
HCT: 30.3 % — ABNORMAL LOW (ref 36.0–46.0)
Hemoglobin: 6 g/dL — CL (ref 12.0–15.0)
Hemoglobin: 9.8 g/dL — ABNORMAL LOW (ref 12.0–15.0)
MCH: 26.8 pg (ref 26.0–34.0)
MCH: 27.8 pg (ref 26.0–34.0)
MCHC: 30.3 g/dL (ref 30.0–36.0)
MCHC: 32.3 g/dL (ref 30.0–36.0)
MCV: 88.4 fL (ref 78.0–100.0)
MCV: 85.8 fL (ref 78.0–100.0)
Platelets: 483 10*3/uL — ABNORMAL HIGH (ref 150–400)
Platelets: 442 10*3/uL — ABNORMAL HIGH (ref 150–400)
RBC: 2.24 MIL/uL — ABNORMAL LOW (ref 3.87–5.11)
RBC: 3.53 MIL/uL — ABNORMAL LOW (ref 3.87–5.11)
RDW: 15.7 % — ABNORMAL HIGH (ref 11.5–15.5)
RDW: 15.1 % (ref 11.5–15.5)
WBC: 9.5 10*3/uL (ref 4.0–10.5)
WBC: 8.8 10*3/uL (ref 4.0–10.5)

## 2018-06-20 LAB — PREPARE RBC (CROSSMATCH)

## 2018-06-20 MED ORDER — POTASSIUM CHLORIDE CRYS ER 20 MEQ PO TBCR
20.0000 meq | EXTENDED_RELEASE_TABLET | ORAL | Status: DC | PRN
  Administered 2018-06-20 (×2): 20 meq via ORAL
  Filled 2018-06-20 (×2): qty 1

## 2018-06-20 MED ORDER — SODIUM CHLORIDE 0.9% IV SOLUTION
Freq: Once | INTRAVENOUS | Status: AC
  Administered 2018-06-20: 12:00:00 via INTRAVENOUS

## 2018-06-20 MED ORDER — SODIUM CHLORIDE 0.9% IV SOLUTION: Freq: Once | INTRAVENOUS | Status: DC

## 2018-06-20 NOTE — Progress Notes (Signed)
Nutrition Follow-up  DOCUMENTATION CODES:   Not applicable  INTERVENTION:   -Continue MVI with minerals daily -Continue Boost Breeze po TID, each supplement provides 250 kcal and 9 grams of protein -Continue 30 ml Prostat TID, each supplement provides 100 kcals and 15 grams protein -Continue to assist pt with meal orders and food preferences  NUTRITION DIAGNOSIS:   Inadequate oral intake related to decreased appetite as evidenced by meal completion < 25%, per patient/family report.  Ongoing  GOAL:   Patient will meet greater than or equal to 90% of their needs  Progressing  MONITOR:   PO intake, Supplement acceptance, Labs, Weight trends, Skin, I & O's  REASON FOR ASSESSMENT:   Consult Assessment of nutrition requirement/status, Poor PO  ASSESSMENT:   67 year old hypertensive nondiabetic female presents with sudden onset of epigastric and back pain.  CTA performed in the ED demonstrated a thoracic aortic intramural hematoma extending from the left subclavian artery to the visceral vessels.  She has had associated nausea and vomiting.   8/09 Thoracentesis due to pleural effusion 8/22 Left subclavian transposition and thoracic endograft  8/25 Thoracic intramural hematoma with lymphatic duct leak with placement of left sided chest tube, exploration of left neck wound with drain placement  15 ml drain output x 24 hours 100 ml chest tube output x 24 hours  Net I/O's: +2.9 L x 24 hours and +10.2 L since 06/06/18  Case discussed extensively with RN prior to visit; pt in better spirits today. Intake has been limited due to diet restrictions and food selectivity. Pt ordered tomato soup, crackers, fruit, and soda for lunch today. RN shares that pt continues to refuse the Prostat supplement, but will usually take Boost Breeze. RN reports per MD not plans to remove chest tube until output is scant.   Spoke with pt at bedside, who was consuming soup and crackers at time of visit.  Pt expresses frustration over meal selections. There are very few items that pt will eat from hospital kitchen, mainly soda, fruit juice, fresh fruit, tomato soup, and grilled chicken sandwiches. Discussed with pt importance of diet restrictions and provided encouragement and emotional support. Pt does not like Prostat supplement, but agreeable to continue with Boost Breeze supplement with RD encouragement. Pt shares she will be calling her daughter to bring in food (per pt, daughter has only done this once before and provided soup from [employer]). Obtained further meal orders and preferences and personally entered into [employer] meal ordering system. Pt requesting fresh fruit at each meal.   Labs reviewed.   Diet Order:   Diet Order            Diet Heart Room service appropriate? Yes; Fluid consistency: Thin  Diet effective now              EDUCATION NEEDS:   Education needs have been addressed  Skin:  Skin Assessment: Skin Integrity Issues: Skin Integrity Issues:: Incisions Incisions: closed lt neck, lt chest  Last BM:  06/19/18  Height:   Ht Readings from Last 1 Encounters:  06/13/18 5' 4.02" (1.626 m)    Weight:   Wt Readings from Last 1 Encounters:  06/13/18 70.7 kg    Ideal Body Weight:  54.5 kg  BMI:  Body mass index is 26.74 kg/m.  Estimated Nutritional Needs:   Kcal:  1800-2000  Protein:  90-105 grams  Fluid:  1.8-2.0 L    Emerick Weatherly A. Jimmye Norman, RD, LDN, CDE Pager: [phone] After hours Pager: [phone]

## 2018-06-20 NOTE — Progress Notes (Signed)
Patient encouraged to go for a walk around the unit and educated on why it is important. Patient states she is tired and would like to rest this morning. She said she could get up later even after multiple attempts from RN to get her up to the chair this morning.   Samantha Stevenson E [name], [place]

## 2018-06-20 NOTE — Progress Notes (Addendum)
Progress Note    06/20/2018 7:37 AM 4 Days Post-Op  Subjective:  Feels better after large BM and now hungry.  Tm 99.9 now 99.2 HR 90's-110's regular 27'P-824'M systolic 35% RA  Vitals:   06/20/18 0518 06/20/18 0600  BP:  113/66  Pulse: 98 91  Resp: (!) 25 19  Temp: 99.2 F (37.3 C)   SpO2: 96% 95%    Physical Exam: Cardiac:  regular Lungs:  Non labored Extremities:  BLE swelling; calves are soft and no tenderness.  DP palpable bilaterally. Abdomen:  +BM  CBC    Component Value Date/Time   WBC 9.5 06/20/2018 0230   RBC 2.24 (L) 06/20/2018 0230   HGB 6.0 (LL) 06/20/2018 0230   HGB 10.1 (L) 03/12/2018 1358   HCT 19.8 (L) 06/20/2018 0230   HCT 32.5 (L) 03/12/2018 1358   PLT 483 (H) 06/20/2018 0230   PLT 317 03/12/2018 1358   MCV 88.4 06/20/2018 0230   MCV 86 03/12/2018 1358   MCH 26.8 06/20/2018 0230   MCHC 30.3 06/20/2018 0230   RDW 15.7 (H) 06/20/2018 0230   RDW 15.9 (H) 03/12/2018 1358   LYMPHSABS 2.7 06/18/2018 0437   MONOABS 0.8 06/18/2018 0437   EOSABS 0.2 06/18/2018 0437   BASOSABS 0.1 06/18/2018 0437    BMET    Component Value Date/Time   NA 137 06/20/2018 0230   NA 141 03/12/2018 1358   K 3.6 06/20/2018 0230   CL 105 06/20/2018 0230   CO2 25 06/20/2018 0230   GLUCOSE 106 (H) 06/20/2018 0230   BUN 5 (L) 06/20/2018 0230   BUN 13 03/12/2018 1358   CREATININE 0.80 06/20/2018 0230   CALCIUM 7.6 (L) 06/20/2018 0230   GFRNONAA >60 06/20/2018 0230   GFRAA >60 06/20/2018 0230    INR    Component Value Date/Time   INR 1.30 06/13/2018 0228     Intake/Output Summary (Last 24 hours) at 06/20/2018 0737 Last data filed at 06/20/2018 0600 Gross per 24 hour  Intake 3297.62 ml  Output 465 ml  Net 2832.62 ml     Assessment:  67 y.o. female is s/p:  Procedure Performed: 1.Left subclavian to carotid artery transposition 2.Percutaneous access right common femoral artery 3.Thoracic endovascular endo-grafting with left subclavian artery  coverage with 28 x 28 x 15 cm distal and 34 x 34 x 20 cm proximal Gore CTAG 4.Left common carotid artery stent with 7 x 40 mmInnovadistal an 8 x 40 mm Innova proximally 5.Right common femoral endarterectomy with greater saphenous vein patch angioplasty 6.Intravascular ultrasound thoracic aorta. 7 Days Post-Op and Placement of 7 French left common femoral central venous line with ultrasound guidance 5 Days Post op And 1.Exploration left neck wound placement of 10 flat drain 2.Left sided 28 French chest tube placement   4 Days Post-Op  Plan: -acute blood loss anemia with hgb of 6 this am-receiving one unit PRBC's and will check cbc.  May need another unit.  Pt feels ok. -abdominal pain improved after large BM -neck drain with minimal drainage -CT with 100cc/24hr.  There is no air leak.  Will d/w Dr. Donzetta Matters when to remove. -most likely transfer to stepdown later today after transfusion. -DVT prophylaxis:  Sq heparin -continue to mobilize   Leontine Locket, [place] [facility] [phone] 06/20/2018 7:37 AM  I have independently interviewed and examined patient and agree with PA assessment and plan above.  We will check triglyceride level and JP drain as it appears clear.  Continue chest tube  until it is also clear.  Okay for transfer to floor.  Tagan Bartram C. Donzetta Matters, MD [facility] [place] Office: [phone] Pager: [phone]

## 2018-06-21 ENCOUNTER — Inpatient Hospital Stay (HOSPITAL_COMMUNITY): Payer: Medicare Other

## 2018-06-21 ENCOUNTER — Telehealth: Payer: Self-pay | Admitting: Vascular Surgery

## 2018-06-21 LAB — TRIGLYCERIDES, BODY FLUIDS: Triglycerides, Fluid: 69 mg/dL

## 2018-06-21 MED ORDER — PRO-STAT SUGAR FREE PO LIQD: 30.0000 mL | Freq: Three times a day (TID) | ORAL | 0 refills | Status: DC

## 2018-06-21 MED ORDER — DILTIAZEM HCL ER COATED BEADS 240 MG PO CP24
240.0000 mg | ORAL_CAPSULE | Freq: Every morning | ORAL | Status: DC
  Administered 2018-06-21 – 2018-06-23 (×3): 240 mg via ORAL
  Filled 2018-06-21 (×6): qty 1

## 2018-06-21 MED ORDER — RIVAROXABAN 20 MG PO TABS: 20.0000 mg | ORAL_TABLET | Freq: Every day | ORAL | Status: DC

## 2018-06-21 MED ORDER — MEDIUM CHAIN TRIGLYCERIDES PO OIL: 15.0000 mL | TOPICAL_OIL | Freq: Three times a day (TID) | ORAL | 12 refills | Status: DC

## 2018-06-21 MED ORDER — OXYCODONE HCL 5 MG PO TABS: 5.0000 mg | ORAL_TABLET | Freq: Four times a day (QID) | ORAL | 0 refills | Status: DC | PRN

## 2018-06-21 MED ORDER — CLOPIDOGREL BISULFATE 75 MG PO TABS: 75.0000 mg | ORAL_TABLET | Freq: Every day | ORAL | 3 refills | Status: DC

## 2018-06-21 MED ORDER — RIVAROXABAN 20 MG PO TABS
20.0000 mg | ORAL_TABLET | Freq: Every day | ORAL | Status: DC
  Administered 2018-06-21 – 2018-06-22 (×2): 20 mg via ORAL
  Filled 2018-06-21 (×2): qty 1

## 2018-06-21 MED ORDER — DIGOXIN 125 MCG PO TABS
250.0000 ug | ORAL_TABLET | Freq: Every morning | ORAL | Status: DC
  Administered 2018-06-21 – 2018-06-23 (×3): 250 ug via ORAL
  Filled 2018-06-21 (×3): qty 2

## 2018-06-21 NOTE — Progress Notes (Addendum)
Progress Note    06/21/2018 7:42 AM 5 Days Post-Op  Subjective:  Says the chest tube and drain tube are pulling and sore, otherwise, no complaints  Afebrile HR 80's-100 NSR 254'Y-706'C systolic 37% RA  Vitals:   06/20/18 2309 06/21/18 0324  BP:  (!) 157/73  Pulse: 95   Resp:    Temp: 99 F (37.2 C) 98.6 F (37 C)  SpO2: 100%     Physical Exam: Cardiac:  regular Lungs:  Non labored Incisions:  Left neck and right groin and clean and dry Extremities:  +palpable left radial pulse; bilateral feet are warm and well perfused.    CBC    Component Value Date/Time   WBC 8.8 06/20/2018 1434   RBC 3.53 (L) 06/20/2018 1434   HGB 9.8 (L) 06/20/2018 1434   HGB 10.1 (L) 03/12/2018 1358   HCT 30.3 (L) 06/20/2018 1434   HCT 32.5 (L) 03/12/2018 1358   PLT 442 (H) 06/20/2018 1434   PLT 317 03/12/2018 1358   MCV 85.8 06/20/2018 1434   MCV 86 03/12/2018 1358   MCH 27.8 06/20/2018 1434   MCHC 32.3 06/20/2018 1434   RDW 15.1 06/20/2018 1434   RDW 15.9 (H) 03/12/2018 1358   LYMPHSABS 2.7 06/18/2018 0437   MONOABS 0.8 06/18/2018 0437   EOSABS 0.2 06/18/2018 0437   BASOSABS 0.1 06/18/2018 0437    BMET    Component Value Date/Time   NA 137 06/20/2018 0230   NA 141 03/12/2018 1358   K 3.6 06/20/2018 0230   CL 105 06/20/2018 0230   CO2 25 06/20/2018 0230   GLUCOSE 106 (H) 06/20/2018 0230   BUN 5 (L) 06/20/2018 0230   BUN 13 03/12/2018 1358   CREATININE 0.80 06/20/2018 0230   CALCIUM 7.6 (L) 06/20/2018 0230   GFRNONAA >60 06/20/2018 0230   GFRAA >60 06/20/2018 0230    INR    Component Value Date/Time   INR 1.30 06/13/2018 0228     Intake/Output Summary (Last 24 hours) at 06/21/2018 0742 Last data filed at 06/21/2018 0612 Gross per 24 hour  Intake 1831.37 ml  Output 44.5 ml  Net 1786.87 ml    JP drain:  Scant output last 24hrs CT drain:  40cc/24hr--clear    Assessment:  67 y.o. female is s/p:  1.Left subclavian to carotid artery  transposition 2.Percutaneous access right common femoral artery 3.Thoracic endovascular endo-grafting with left subclavian artery coverage with 28 x 28 x 15 cm distal and 34 x 34 x 20 cm proximal Gore CTAG 4.Left common carotid artery stent with 7 x 40 mmInnovadistal an 8 x 40 mm Innova proximally 5.Right common femoral endarterectomy with greater saphenous vein patch angioplasty 6.Intravascular ultrasound thoracic aorta. 8Days Post-Op and Placement of 7 French left common femoral central venous line with ultrasound guidance 6Days Post op And 1.Exploration left neck wound placement of 10 flat drain 2.Left sided 28 French chest tube placement   5 Days Post-Op    Plan: -pt doing well this morning -incisions are healing fine -triglyceride level still pending.  -JP drain with scant drainage past 24 hrs-keep drain -CT with 40cc clear fluid past 24 hrs-remove chest tube today -DVT prophylaxis:  Sq Heparin discontinued yesterday-restart Xarelto today   Leontine Locket, PA-C [facility] [phone] 06/21/2018 7:42 AM  I have independently interviewed and examined patient and agree with PA assessment and plan above. Ok to remove chest tube today as it has put out only 40 cc that is serous fluid. Will keep  jp drain for home.  Restart home Xarelto and antihypertensive medications.  Will likely be able to discharge this weekend and follow-up with me in 2 weeks where I can remove the drain if output remains scant.  She will remain on low-fat diet with MCT Oil at home.  Sheron Tallman C. Donzetta Matters, MD [facility] [place] Office: [phone] Pager: [phone]

## 2018-06-21 NOTE — Telephone Encounter (Signed)
sch appt lvm 07/12/18 930am p/o MD

## 2018-06-21 NOTE — Discharge Instructions (Signed)
[facility]   Discharge Instructions  Endovascular Aortic Aneurysm Repair  Please refer to the following instructions for your post-procedure care. Your surgeon or Physician Assistant will discuss any changes with you.  Activity  You are encouraged to walk as much as you can. You can slowly return to normal activities but must avoid strenuous activity and heavy lifting until your doctor tells you it's OK. Avoid activities such as vacuuming or swinging a gold club. It is normal to feel tired for several weeks after your surgery. Do not drive until your doctor gives the OK and you are no longer taking prescription pain medications. It is also normal to have difficulty with sleep habits, eating, and bowel movements after surgery. These will go away with time.  Bathing/Showering  You may shower after you go home. If you have an incision, do not soak in a bathtub, hot tub, or swim until the incision heals completely.  Incision Care  Shower every day. Clean your incision with mild soap and water. Pat the area dry with a clean towel. You do not need a bandage unless otherwise instructed. Do not apply any ointments or creams to your incision. If you clothing is irritating, you may cover your incision with a dry gauze pad.  Wash the groin wound with soap and water daily and pat dry. (No tub bath-only shower)  Then put a dry gauze or washcloth there to keep this area dry daily and as needed.  Do not use Vaseline or neosporin on your incisions.  Only use soap and water on your incisions and then protect and keep dry.  Empty bulb drain daily and keep a record of the amount daily and bring to the office to your appointment with Dr. Donzetta Matters.   Diet  A low fat/low cholesterol/high protein diet is recommended for all patients with vascular disease. In order to heal from your surgery, it is CRITICAL to get adequate nutrition. Your body requires vitamins, minerals, and  protein. Vegetables are the best source of vitamins and minerals. Vegetables also provide the perfect balance of protein. Processed food has little nutritional value, so try to avoid this.  Medications  Resume taking all of your medications unless your doctor or nurse practitioner tells you not to. If your incision is causing pain, you may take over-the-counter pain relievers such as acetaminophen (Tylenol). If you were prescribed a stronger pain medication, please be aware these medications can cause nausea and constipation. Prevent nausea by taking the medication with a snack or meal. Avoid constipation by drinking plenty of fluids and eating foods with a high amount of fiber, such as fruits, vegetables, and grains. Do not take Tylenol if you are taking prescription pain medications.   Follow up  [place] office will schedule a follow-up appointment with a C.T. scan 3-4 weeks after your surgery.  Please call us immediately for any of the following conditions  Severe or worsening pain in your legs or feet or in your abdomen back or chest. Increased pain, redness, drainage (pus) from your incision sit. Increased abdominal pain, bloating, nausea, vomiting or persistent diarrhea. Fever of 101 degrees or higher. Swelling in your leg (s),  Reduce your risk of vascular disease  Stop smoking. If you would like help call [facility] at 1-800-QUIT-NOW 709-772-9160) or [place] at ([phone] 678-3180. Manage your cholesterol Maintain a desired weight Control your diabetes Keep your blood pressure down  If you have questions, please call the office at [phone].  Information on my medicine - XARELTO (Rivaroxaban)  This medication education was reviewed with me or my healthcare representative as part of my discharge preparation.  The pharmacist that spoke with me during my hospital stay was:  Einar Grad, [facility]  Why was Xarelto prescribed for you? Xarelto was prescribed for you to  reduce the risk of a blood clot forming that can cause a stroke if you have a medical condition called atrial fibrillation (a type of irregular heartbeat).  What do you need to know about xarelto ? Take your Xarelto ONCE DAILY at the same time every day with your evening meal. If you have difficulty swallowing the tablet whole, you may crush it and mix in applesauce just prior to taking your dose.  Take Xarelto exactly as prescribed by your doctor and DO NOT stop taking Xarelto without talking to the doctor who prescribed the medication.  Stopping without other stroke prevention medication to take the place of Xarelto may increase your risk of developing a clot that causes a stroke.  Refill your prescription before you run out.  After discharge, you should have regular check-up appointments with your healthcare provider that is prescribing your Xarelto.  In the future your dose may need to be changed if your kidney function or weight changes by a significant amount.  What do you do if you miss a dose? If you are taking Xarelto ONCE DAILY and you miss a dose, take it as soon as you remember on the same day then continue your regularly scheduled once daily regimen the next day. Do not take two doses of Xarelto at the same time or on the same day.   Important Safety Information A possible side effect of Xarelto is bleeding. You should call your healthcare provider right away if you experience any of the following: ? Bleeding from an injury or your nose that does not stop. ? Unusual colored urine (red or dark brown) or unusual colored stools (red or black). ? Unusual bruising for unknown reasons. ? A serious fall or if you hit your head (even if there is no bleeding).  Some medicines may interact with Xarelto and might increase your risk of bleeding while on Xarelto. To help avoid this, consult your healthcare provider or pharmacist prior to using any new prescription or non-prescription  medications, including herbals, vitamins, non-steroidal anti-inflammatory drugs (NSAIDs) and supplements.  This website has more information on Xarelto: [email or web address]

## 2018-06-21 NOTE — Progress Notes (Addendum)
Discussed home medications with Dr. Donzetta Matters.  Will resume her home [place] and Dilt-XR and discontinue her Cozaar.  Spoke with Legrand Como from pharmacy and okay to restart Digitek at her home dose as her creatinine is normal.   She will need to go home on Plavix for 3 months for carotid stent as well as Xarelto for hx of DVT/Afib (restarted this am).  She will also need MCT oil for discharge.    She will discharge home with JP drain.    Leontine Locket, [facility] 06/21/2018. 8:46 AM

## 2018-06-21 NOTE — Care Management Important Message (Signed)
Important Message  Patient Details  Name: Samantha Stevenson MRN: [id number] Date of Birth: 1951/05/15   Medicare Important Message Given:  Yes    Barb Merino [place] 06/21/2018, 4:19 PM

## 2018-06-21 NOTE — Discharge Summary (Signed)
[facility] Discharge Summary   Samantha Stevenson Mar 11, 1951 67 y.o. female  MRN: [id number]  Admission Date: 05/28/2018  Discharge Date: 06/23/18  Physician: Thomes Lolling*  Admission Diagnosis: Intramural aortic hematoma ([place]) [I71.00] Chest pain [R07.9]   HPI:   This is a 67 y.o. 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  She presented to the ED with complaints of acute onset epigastric and back pain.  She also complained of associated N/V.  CTA of the chest and abdomen was performed and showed a thoracic aortic intramural hematoma with extension from the left subclavian artery to the visceral vessels.  Cardiothoracic surgery consult was requested and she was evaluated by Dr. Prescott Gum who recommended for admission.   Hospital Course:  The patient was admitted to the hospital and She was admitted and started on Esmolol drip for BP control.  She was also given a dose of Factor X recombinant to reverse her Xarelto.  During her hospital stay the patients blood pressure was well controlled on Esmolol and Cardizem.  She was started on her home regimen of digoxin.  The patient continued to have discomfort in her chest and back.  Her pain medication was adjusted.  Her CXR showed development of left sided pleural effusion.  Repeat CTA of chest, abdomen and pelvis showed improvement of patient's intramural hematoma and confirmed the presence of a large left sided pleural effusion.  The patient developed a drop in her oxygen saturations and a cough.  It was felt thoracentesis should be performed.  This was done on 05/31/2018 and removed 400 ml of yellow serous fluid.  VVS was consulted on 06/06/18.  She has what appears to be IMH likely originating just distal to the left subclavian artery.  She has a bovine arch.  Would likely need subclavian transposition versus carotid subclavian bypass but to gain  approximately 1 cm of distance for thoracic endograft.  We will evaluate over the weekend for persistent pain and if she is not better we will plan above-noted procedure next week.  She had repeat CT scan, which revealed that she will need subclavian artery deep branching with either bypass or transposition as well as thoracic endograft.   She did have a carotid duplex on 06/11/18, which revealed right ICA stenosis of 40-59% and left 1-39% ICA stenosis.   Bilateral vertebral arteries patent with antegrade flow.   Pt was taken to the operating room on 06/16/2018 and underwent: 1.  Left subclavian to carotid artery transposition 2.  Percutaneous access right common femoral artery 3.  Thoracic endovascular endo-grafting with left subclavian artery coverage with 28 x 28 x 15 cm distal and 34 x 34 x 20 cm proximal Gore CTAG 4.  Left common carotid artery stent with 7 x 40 mm Innova distal an 8 x 40 mm Innova proximally 5.  Right common femoral endarterectomy with greater saphenous vein patch angioplasty 6.  Intravascular ultrasound thoracic aorta.    Findings: The left subclavian and common carotid arteries had significant soft plaque consistent with extension of intramural hematoma.  After subclavian transposition there were expected signals in the common carotid, left vertebral, and left subclavian arteries and there was an expected waveform at the left radial artery arterial line at the wrist.  By intravascular ultrasound there was large intramural hematoma beginning just distal to the left subclavian artery extending down through the entire thoracic aorta and there was what appeared to  be ulceration 2 cm above the proximal iliac artery.  The aortic diameter was 34 mm at the landing zone and 81mm distally. Following endo-grafting from the innominate artery to the celiac artery we had adequate flow to the bilateral common femoral arteries.  There was no balloon dilatation of the endograft.  There did appear  to be a dissection in the left common carotid artery.  In reviewing the CT scan it appears this may have been chronic.  We elected to stent this and unfortunately our first I did cover the subclavian transposition although we still had expected waveform at the left radial artery line.  We then placed a stent proximally extending just to the takeoff of the left common carotid artery.  The stent was not ballooned.  We had good flow extending all the way up to the common into the internal carotid artery.  After removing our 53 French sheath in the right common femoral artery we had no signals at the right foot and then performed endarterectomy and vein patch angioplasty which we had very strong signal at the right dorsalis pedis artery.  The pt tolerated the procedure well and was transported to the PACU in good condition.   On POD 1, the pt was s/p status post left subclavian transposition and thoracic endograft doing for substantial IMH of her thoracic aorta.  She also had dissection of her left common carotid artery that had to be stented and unfortunately crossed her transposed subclavian artery we will need to put her on Plavix once our lumbar drain is out.  She is neurologically intact and I think it is okay to remove that at the discretion of anesthesia and then she can be out of bed later today.  She can have a diet as tolerated.  Plan will be to restart Xarelto prior to discharge home for chronic atrial fibrillation.  On POD 2, she had been progressing well and neuro intact, but did become tachycardic overnight and hypotensive with standing.  She received a 1L bolus of fluid and maintenance fluids running.  She ha dBMP/CBC and stat EKG done and was consistent with sinus tachycardia.  A cxr was obtained given she had a previous PTX, but less concerned about this as she was oxygenating well.  She was started on Plavix.    Later that morning, She now is in need of urgent IV fluids lab draws and I  discussed with her the risk and benefits of proceeding with emergent IV access via central venous line consent was signed and she agrees to proceed.  Findings: The vein is nearly absent and the artery is very easily compressible consistent with significant dehydration.  I completion a 7 French triple-lumen 20 cm line was placed to the left common femoral vein and it flushed and withdrew easily.  That evening, pt was evaluated at bedside continues to be tachypneic and tachycardic although both have improved as has her ability to interact.  She does have 5 out of 10 chest pain similar to her preoperative status.  Her chest x-ray demonstrates a large left-sided pleural effusion and interventional radiology is going to drain this tonight with ultrasound guidance.  Their care of this patient is greatly appreciated.  Should she have persistent chest pain or hemodynamic instability post pleural effusion drainage we will consider CT Angio of dissection protocol to evaluate for extension of her IMH.  I discussed plan with patient and her family and they are agreeable as stated above.  She was  taken to IR on 06/15/18 and underwent left thoracentesis with removal of 1.5L fluid.    On 06/16/18, Dr. Donzetta Matters discussed with the patient and her daughter that she has a lymphatic leak likely arising from her left neck draining into her left chest from her radius rent in her pleura.  We will take her to the operating room today to explore her left neck incision hopefully find the source and seal it.  We will also place a left-sided chest tube to keep her drain.  Yesterday she demonstrates good understanding and is that she is n.p.o. ready for surgery.  She was taken to the operating room on 06/16/18 and underwent:  1.  Exploration left neck wound placement of 10 flat drain 2.  Left sided 28 French chest tube placement  Findings: The left neck had minimal hematoma without any lymph fluid there is no identifiable draining  lymphatic duct.  The left chest put out greater than 250 cc of milky appearing fluid through 28 French chest tube upon placement.  10 flat drain was placed in the neck incision was closed.  Chest tube on the left was hooked to water seal.  On 06/17/18, Drainage and chest tube and JP appears to be slowing down and does not appear overtly chylous.  We will plan to get PICC line to get the left femoral line out and this order has been placed.  Continue low-fat diet with medium chain triglyceride supplementation.  Out of bed as tolerated.  If she remains hemodynamically stable can likely transfer out of ICU soon.  On 06/20/18, she did have acute blood loss anemia and received transfusion.  Her drainage continued to decrease.  She was transferred to the stepdown unit.  On 06/21/18, Discussed home medications with Dr. Donzetta Matters.  Will resume her home [place] and Dilt-XR and discontinue her Cozaar.  Spoke with Legrand Como from pharmacy and okay to restart Digitek at her home dose as her creatinine is normal.   She will need to go home on Plavix for 3 months for carotid stent as well as Xarelto for hx of DVT/Afib (restarted this am).  She will also need MCT oil for discharge.    On 06/23/18, CXR revealed no PTX with left lobe consolidation unchanged for 3 days.  May need evaluation of left hemidiaphragm at some point.   Pt refused to be discharged with JP drain.  Given no drain output for 3 days, the drain was removed.    The remainder of the hospital course consisted of increasing mobilization and increasing intake of solids without difficulty.  CBC    Component Value Date/Time   WBC 8.8 06/20/2018 1434   RBC 3.53 (L) 06/20/2018 1434   HGB 9.8 (L) 06/20/2018 1434   HGB 10.1 (L) 03/12/2018 1358   HCT 30.3 (L) 06/20/2018 1434   HCT 32.5 (L) 03/12/2018 1358   PLT 442 (H) 06/20/2018 1434   PLT 317 03/12/2018 1358   MCV 85.8 06/20/2018 1434   MCV 86 03/12/2018 1358   MCH 27.8 06/20/2018 1434   MCHC 32.3 06/20/2018  1434   RDW 15.1 06/20/2018 1434   RDW 15.9 (H) 03/12/2018 1358   LYMPHSABS 2.7 06/18/2018 0437   MONOABS 0.8 06/18/2018 0437   EOSABS 0.2 06/18/2018 0437   BASOSABS 0.1 06/18/2018 0437    BMET    Component Value Date/Time   NA 137 06/20/2018 0230   NA 141 03/12/2018 1358   K 3.6 06/20/2018 0230   CL 105 06/20/2018 0230  CO2 25 06/20/2018 0230   GLUCOSE 106 (H) 06/20/2018 0230   BUN 5 (L) 06/20/2018 0230   BUN 13 03/12/2018 1358   CREATININE 0.80 06/20/2018 0230   CALCIUM 7.6 (L) 06/20/2018 0230   GFRNONAA >60 06/20/2018 0230   GFRAA >60 06/20/2018 0230         Discharge Diagnosis:  Intramural aortic hematoma ([facility]) [I71.00] Chest pain [R07.9]  Secondary Diagnosis: Patient Active Problem List   Diagnosis Date Noted  . Thoracic aortic dissection ([place]) 06/13/2018  . Intramural aortic hematoma ([place]) 05/28/2018  . Dysfunction of left eustachian tube 04/05/2018  . Preoperative clearance 02/27/2018  . Bilateral leg edema 02/27/2018  . Dyspnea 06/19/2016  . Routine general medical examination at a health care facility 06/24/2015  . Constipation 03/17/2015  . Myalgia and myositis 03/17/2015  . Dyslipidemia   . Atrial fibrillation ([place])   . Hypertension   . History of pulmonary embolism 01/10/2013  . Anemia 01/10/2013  . Arthritis 01/03/2013   Past Medical History:  Diagnosis Date  . Anemia   . Arthritis   . Atrial fibrillation ([place])   . Degenerative arthritis of hip    s/p L THR 12/2012  . Dyslipidemia   . Hyperlipidemia   . Hypertension   . Hypertension   . Migraines   . Myocardial infarction ([place]) 2000   due to atrial fib  . Pulmonary emboli ([place]) 12/2012 dx   post op (L THR), anticoag x 74mo     Allergies as of 06/21/2018      Reactions   Penicillins Swelling, Other (See Comments)   Severe swelling PATIENT HAS HAD A PCN REACTION WITH IMMEDIATE RASH, FACIAL/TONGUE/THROAT SWELLING, SOB, OR LIGHTHEADEDNESS WITH HYPOTENSION:  #  #  YES  #  #  Has  patient had a PCN reaction causing severe rash involving mucus membranes or skin necrosis: No PATIENT HAS HAD A PCN REACTION THAT REQUIRED HOSPITALIZATION:  #  #  YES  #  #  Has patient had a PCN reaction occurring within the last 10 years: No   Latex Hives, Rash   Asa [aspirin] Nausea And Vomiting   Makes stomach upset   Celebrex [celecoxib] Nausea And Vomiting   GI upset   Nsaids Rash      Medication List    STOP taking these medications   doxycycline 100 MG tablet Commonly known as:  VIBRA-TABS     TAKE these medications   acetaminophen 500 MG tablet Commonly known as:  TYLENOL Take 1,000 mg by mouth as needed for moderate pain.   clopidogrel 75 MG tablet Commonly known as:  PLAVIX Take 1 tablet (75 mg total) by mouth daily.   DIGITEK 0.25 MG tablet Generic drug:  digoxin TAKE 1 TABLET BY MOUTH EVERY MORNING   DILT-XR 240 MG 24 hr capsule Generic drug:  diltiazem TAKE 1 CAPSULE BY MOUTH EVERY MORNING   feeding supplement (PRO-STAT SUGAR FREE 64) Liqd Take 30 mLs by mouth 3 (three) times daily with meals.   ferrous sulfate 325 (65 FE) MG tablet Take 325 mg by mouth once a week.   fluticasone 50 MCG/ACT nasal spray Commonly known as:  FLONASE Place 2 sprays into both nostrils daily.   furosemide 20 MG tablet Commonly known as:  LASIX Take 20 mg by mouth daily as needed for fluid.   medium chain triglycerides oil Commonly known as:  MCT OIL Take 15 mLs by mouth 3 (three) times daily.   multivitamin tablet Take 1 tablet by  mouth daily.   oxyCODONE 5 MG immediate release tablet Commonly known as:  Oxy IR/ROXICODONE Take 1 tablet (5 mg total) by mouth every 6 (six) hours as needed for moderate pain or breakthrough pain.   rivaroxaban 20 MG Tabs tablet Commonly known as:  XARELTO Take 1 tablet (20 mg total) by mouth daily with supper.   rosuvastatin 20 MG tablet Commonly known as:  CRESTOR TAKE 1 TABLET BY MOUTH EVERY DAY       Discharge  Instructions:  [facility]  Discharge Instructions Endovascular Aortic Aneurysm Repair  Please refer to the following instructions for your post-procedure care. Your surgeon or Physician Assistant will discuss any changes with you.  Activity  You are encouraged to walk as much as you can. You can slowly return to normal activities but must avoid strenuous activity and heavy lifting until your doctor tells you it's OK. Avoid activities such as vacuuming or swinging a gold club. It is normal to feel tired for several weeks after your surgery. Do not drive until your doctor gives the OK and you are no longer taking prescription pain medications. It is also normal to have difficulty with sleep habits, eating, and bowel movements after surgery. These will go away with time.  Bathing/Showering  You may shower after you go home. If you have an incision, do not soak in a bathtub, hot tub, or swim until the incision heals completely.  Incision Care  Shower every day. Clean your incision with mild soap and water. Pat the area dry with a clean towel. You do not need a bandage unless otherwise instructed. Do not apply any ointments or creams to your incision. If you clothing is irritating, you may cover your incision with a dry gauze pad.  Wash the groin wound with soap and water daily and pat dry. (No tub bath-only shower)  Then put a dry gauze or washcloth there to keep this area dry daily and as needed.  Do not use Vaseline or neosporin on your incisions.  Only use soap and water on your incisions and then protect and keep dry.  Empty bulb drain daily and keep a record of the amount daily and bring to the office to your appointment with Dr. Donzetta Matters.   Diet  Diet  A low fat/low cholesterol/high protein diet is recommended for all patients with vascular disease. In order to heal from your surgery, it is CRITICAL to get adequate nutrition. Your body requires vitamins,  minerals, and protein. Vegetables are the best source of vitamins and minerals. Vegetables also provide the perfect balance of protein. Processed food has little nutritional value, so try to avoid this.  Medications  Resume taking all of your medications unless your doctor or Physician Assistnat tells you not to. If your incision is causing pain, you may take over-the-counter pain relievers such as acetaminophen (Tylenol). If you were prescribed a stronger pain medication, please be aware these medications can cause nausea and constipation. Prevent nausea by taking the medication with a snack or meal. Avoid constipation by drinking plenty of fluids and eating foods with a high amount of fiber, such as fruits, vegetables, and grains. Do not take Tylenol if you are taking prescription pain medications.   Follow up  [place] office will schedule a follow-up appointment with a C.T. scan 3-4 weeks after your surgery.  Please call us immediately for any of the following conditions  Severe or worsening pain in your legs or feet or  in your abdomen back or chest. Increased pain, redness, drainage (pus) from your incision sit. Increased abdominal pain, bloating, nausea, vomiting or persistent diarrhea. Fever of 101 degrees or higher. Swelling in your leg (s),  Reduce your risk of vascular disease  .Stop smoking. If you would like help call [facility] at 1-800-QUIT-NOW 610-098-8469) or [place] at 717-163-2364. .Manage your cholesterol .Maintain a desired weight .Control your diabetes .Keep your blood pressure down  If you have questions, please call the office at [phone].    Prescriptions given: 1.  Roxicodone #30 No Refill 2.  Pro Stat feeding supplement 3.  Plavix 75mg  daily #30 3 Refills 4.  MCT oil 75ml tid 980ml 12 Refills  Disposition: home  Patient's condition: is Good  Follow up: 1. Dr. Donzetta Matters in 2 weeks.  He will order the CTA scan at that time and possibly remove the JP  drain if drainage remains scant.    Leontine Locket, PA-C [facility] [phone] 06/21/2018  8:51 AM   - For VQI Registry use - Post-op:  Time to Extubation: [x]  In OR, [ ]  < 12 hrs, [ ]  12-24 hrs, [ ]  >=24 hrs Vasopressors Req. Post-op: No MI: No., [ ]  Troponin only, [ ]  EKG or Clinical New Arrhythmia: No CHF: No ICU Stay: 4 days in ICU Transfusion: Yes     If yes, 2 units given  Complications: Resp failure: No., [ ]  Pneumonia, [ ]  Ventilator Chg in renal function: No., [ ]  Inc. Cr > 0.5, [ ]  Temp. Dialysis,  [ ]  Permanent dialysis Leg ischemia: No., no Surgery needed, [ ]  Yes, Surgery needed,  [ ]  Amputation Bowel ischemia: No., [ ]  Medical Rx, [ ]  Surgical Rx Wound complication: Yes.  , [ ]  Superficial separation/infection, [ x] Return to OR for lymphatic duct leak Return to OR: Yes  Return to OR for bleeding: No Stroke: No., [ ]  Minor, [ ]  Major  Discharge medications: Statin use:  Yes  ASA use:  No  Plavix use:  Yes  Beta blocker use:  No  ARB use:  No ACEI use:  No CCB use:  Yes Xarelto:  Yes.

## 2018-06-21 NOTE — Progress Notes (Signed)
ANTICOAGULATION CONSULT NOTE - Initial Consult  Pharmacy Consult for rivaroxaban Indication: atrial fibrillation  Allergies  Allergen Reactions  . Penicillins Swelling and Other (See Comments)    Severe swelling PATIENT HAS HAD A PCN REACTION WITH IMMEDIATE RASH, FACIAL/TONGUE/THROAT SWELLING, SOB, OR LIGHTHEADEDNESS WITH HYPOTENSION:  #  #  YES  #  #  Has patient had a PCN reaction causing severe rash involving mucus membranes or skin necrosis: No PATIENT HAS HAD A PCN REACTION THAT REQUIRED HOSPITALIZATION:  #  #  YES  #  #  Has patient had a PCN reaction occurring within the last 10 years: No  . Latex Hives and Rash  . Asa [Aspirin] Nausea And Vomiting    Makes stomach upset  . Celebrex [Celecoxib] Nausea And Vomiting    GI upset  . Nsaids Rash    Patient Measurements: Height: 5\' 4"  (162.6 cm) Weight: 162 lb 4.1 oz (73.6 kg) IBW/kg (Calculated) : 54.7  Vital Signs: Temp: 98.6 F (37 C) (08/30 0324) Temp Source: Oral (08/30 0324) BP: 157/73 (08/30 0324) Pulse Rate: 95 (08/29 2309)  Labs: Recent Labs    06/19/18 0529 06/20/18 0230 06/20/18 1434  HGB 7.3* 6.0* 9.8*  HCT 23.6* 19.8* 30.3*  PLT 407* 483* 442*  CREATININE 1.01* 0.80  --     Estimated Creatinine Clearance: 69 mL/min (by C-G formula based on SCr of 0.8 mg/dL).   Medical History: Past Medical History:  Diagnosis Date  . Anemia   . Arthritis   . Atrial fibrillation ([place])   . Degenerative arthritis of hip    s/p L THR 12/2012  . Dyslipidemia   . Hyperlipidemia   . Hypertension   . Hypertension   . Migraines   . Myocardial infarction ([place]) 2000   due to atrial fib  . Pulmonary emboli ([place]) 12/2012 dx   post op (L THR), anticoag x 37mo     Assessment: 76 yoF on rivaroxaban PTA for hx of AF admitted with thoracic aortic intramural hematoma s/p Andexxa on 8/6 for urgent reversal. Pharmacy consulted to resume Xarelto today. Of note, pt received pRBCs yesterday for low Hgb, no new CBC today but  drains are clear. Discussed with [facility] - will recheck CBC tomorrow with labs, okay to resume full-dose anticoagulation.  Goal of Therapy:  Monitor platelets by anticoagulation protocol: Yes   Plan:  -Rivaroxaban 20mg  daily with supper -Pharmacy will sign off, reconsult as needed  Arrie Senate, PharmD, BCPS Clinical Pharmacist [phone] Please check AMION for all [place] numbers 06/21/2018

## 2018-06-22 ENCOUNTER — Inpatient Hospital Stay (HOSPITAL_COMMUNITY): Payer: Medicare Other

## 2018-06-22 LAB — CBC
HCT: 33.8 % — ABNORMAL LOW (ref 36.0–46.0)
Hemoglobin: 10.6 g/dL — ABNORMAL LOW (ref 12.0–15.0)
MCH: 27.6 pg (ref 26.0–34.0)
MCHC: 31.4 g/dL (ref 30.0–36.0)
MCV: 88 fL (ref 78.0–100.0)
Platelets: 494 10*3/uL — ABNORMAL HIGH (ref 150–400)
RBC: 3.84 MIL/uL — ABNORMAL LOW (ref 3.87–5.11)
RDW: 15.7 % — ABNORMAL HIGH (ref 11.5–15.5)
WBC: 10.2 10*3/uL (ref 4.0–10.5)

## 2018-06-22 NOTE — Progress Notes (Signed)
Patient declining to go to xray at this time will monitor patient. Zyden Suman, Bettina Gavia RN

## 2018-06-22 NOTE — Progress Notes (Addendum)
[facility] [place]  Subjective  - Patient did not rest well last night.   Objective 128/62 74 99.1 F (37.3 C) (Oral) (!) 75 98%  Intake/Output Summary (Last 24 hours) at 06/22/2018 0855 Last data filed at 06/22/2018 0441 Gross per 24 hour  Intake 880 ml  Output 803 ml  Net 77 ml   Left neck incision healing well, JP drain in place 3 cc Right groin soft healing well Palpable radial pulses B, B DP pulses Feet warm and well perfused moving all 4 ext. Chest tube d/c'd yesterday  Assessment/Planning: 67 y.o. female is s/p:  1.Left subclavian to carotid artery transposition 2.Percutaneous access right common femoral artery 3.Thoracic endovascular endo-grafting with left subclavian artery coverage with 28 x 28 x 15 cm distal and 34 x 34 x 20 cm proximal Gore CTAG 4.Left common carotid artery stent with 7 x 40 mmInnovadistal an 8 x 40 mm Innova proximally 5.Right common femoral endarterectomy with greater saphenous vein patch angioplasty 6.Intravascular ultrasound thoracic aorta. 8Days Post-Op and Placement of 7 French left common femoral central venous line with ultrasound guidance 6Days Post op And 1.Exploration left neck wound placement of 10 flat drain 2.Left sided 28 French chest tube placement  6 Days Post-Op  Patient did not rest well last night and is refusing chest x ray this am.  Nursing staff will try to convince her to proceed later today. Disposition stable over all. Plan per Dr. Donzetta Matters: Restart home Xarelto and antihypertensive medications.  Will likely be able to discharge this weekend and follow-up with me in 2 weeks where I can remove the drain if output remains scant.  She will remain on low-fat diet with MCT Oil at home.  [name] 06/22/2018 8:55 AM -- Agree with above.  Pt agreeable to chest xray now Will follow up on this later today JP left neck minimal 15/5/0 zero so far today. If output less  than 15 tomorrow will d/c Possibly home Monday or Tuesday  Ruta Hinds, MD [facility] [place]: [phone] Pager: [phone]  Laboratory Lab Results: Recent Labs    06/20/18 1434 06/22/18 0737  WBC 8.8 10.2  HGB 9.8* 10.6*  HCT 30.3* 33.8*  PLT 442* 494*   BMET Recent Labs    06/20/18 0230  NA 137  K 3.6  CL 105  CO2 25  GLUCOSE 106*  BUN 5*  CREATININE 0.80  CALCIUM 7.6*    COAG Lab Results  Component Value Date   INR 1.30 06/13/2018   INR 1.21 05/29/2018   INR 1.11 01/10/2013   No results found for: PTT

## 2018-06-23 LAB — TYPE AND SCREEN
ABO/RH(D): B POS
Antibody Screen: NEGATIVE
Unit division: 0
Unit division: 0
Unit division: 0
Unit division: 0

## 2018-06-23 LAB — BPAM RBC
Blood Product Expiration Date: 201909212359
Blood Product Expiration Date: 201909242359
Blood Product Expiration Date: 201909262359
Blood Product Expiration Date: 201909262359
ISSUE DATE / TIME: 201908221516
ISSUE DATE / TIME: 201908290456
ISSUE DATE / TIME: 201908291135
Unit Type and Rh: 7300
Unit Type and Rh: 7300
Unit Type and Rh: 7300
Unit Type and Rh: 7300

## 2018-06-23 MED ORDER — CLOPIDOGREL BISULFATE 75 MG PO TABS: 75.0000 mg | ORAL_TABLET | Freq: Every day | ORAL | 3 refills | Status: DC

## 2018-06-23 MED ORDER — MEDIUM CHAIN TRIGLYCERIDES PO OIL: 15.0000 mL | TOPICAL_OIL | Freq: Three times a day (TID) | ORAL | 12 refills | Status: DC

## 2018-06-23 MED ORDER — OXYCODONE HCL 5 MG PO TABS: 5.0000 mg | ORAL_TABLET | Freq: Four times a day (QID) | ORAL | 0 refills | Status: DC | PRN

## 2018-06-23 NOTE — Progress Notes (Addendum)
[facility]  Subjective  - She doesn't want to go home with the drain in place.   Objective (!) 142/68 85 99.4 F (37.4 C) (Oral) (!) 27 99%  Intake/Output Summary (Last 24 hours) at 06/23/2018 0731 Last data filed at 06/23/2018 0500 Gross per 24 hour  Intake 120 ml  Output 6 ml  Net 114 ml    Palpable radial and DP pulses B, moving all ext without difficulty Right chest JP 6 cc total last 24 hours Right groin soft healing well Left neck incision healing well  Assessment/Planning: 67 y.o.femaleis s/p:  1.Left subclavian to carotid artery transposition 2.Percutaneous access right common femoral artery 3.Thoracic endovascular endo-grafting with left subclavian artery coverage with 28 x 28 x 15 cm distal and 34 x 34 x 20 cm proximal Gore CTAG 4.Left common carotid artery stent with 7 x 40 mmInnovadistal an 8 x 40 mm Innova proximally 5.Right common femoral endarterectomy with greater saphenous vein patch angioplasty 6.Intravascular ultrasound thoracic aorta. 8Days Post-Op and Placement of 7 French left common femoral central venous line with ultrasound guidance 6Days Post op And 1.Exploration left neck wound placement of 10 flat drain 2.Left sided 28 French chest tube placement 7 Days Post-Op CHEST - 2 VIEW Drain d/c'd patient tolerated well.  She may shower in 24.    COMPARISON:  06/21/2018  FINDINGS: No pneumothorax post left chest tube removal. Left lower lobe consolidation and small left effusion unchanged. Right lung remains clear  Thoracic aortic stent graft unchanged. Left common carotid stent unchanged.  IMPRESSION: No pneumothorax post left chest tube removal. Left lower lobe consolidation and small left effusion unchanged.  Continue current treatment O2 SAT 99% RA BP stable, HGB stable JP in place  Plan per Dr. Donzetta Matters: Restart home Xarelto and antihypertensive medications. Will likely be  able to discharge this weekend and follow-up with me in 2 weeks where I can remove the drain if output remains scant. She will remain on low-fat diet with MCT Oil at home.  [name] 06/23/2018 7:31 AM -- No drain output for 3 days.  D/c today. D/c home today Chest xray left lobe consolidation unchanged over last 3 days.  May need eval of left hemidiaphragm at some point  Ruta Hinds, MD [facility]: [phone] Pager: [phone]  Laboratory Lab Results: Recent Labs    06/20/18 1434 06/22/18 0737  WBC 8.8 10.2  HGB 9.8* 10.6*  HCT 30.3* 33.8*  PLT 442* 494*   BMET No results for input(s): NA, K, CL, CO2, GLUCOSE, BUN, CREATININE, CALCIUM in the last 72 hours.  COAG Lab Results  Component Value Date   INR 1.30 06/13/2018   INR 1.21 05/29/2018   INR 1.11 01/10/2013   No results found for: PTT

## 2018-06-23 NOTE — Progress Notes (Signed)
Patient is ready for discharge. Discharge instructions have been explained to patient and she has had all questions regarding her discharge answered. CCMD has been notified that patient is being discharged and to remove them from telemetry. IV has been removed, catheter intact and no complications. Patient tolerated removal well. She has all of her belongings with her. She will be transported home by her daughter who is currently here with her. Patient will leave the unit via wheelchair and will meet her daughter at the front entrance of the hospital.

## 2018-06-25 ENCOUNTER — Telehealth: Payer: Self-pay | Admitting: *Deleted

## 2018-06-25 NOTE — Telephone Encounter (Signed)
Dorian Pod, nurse with [facility] home [facility] called stating that patient has refused home health services.

## 2018-07-04 ENCOUNTER — Other Ambulatory Visit (INDEPENDENT_AMBULATORY_CARE_PROVIDER_SITE_OTHER): Payer: Medicare Other

## 2018-07-04 ENCOUNTER — Ambulatory Visit (INDEPENDENT_AMBULATORY_CARE_PROVIDER_SITE_OTHER): Payer: Medicare Other | Admitting: Internal Medicine

## 2018-07-04 ENCOUNTER — Encounter: Payer: Self-pay | Admitting: Internal Medicine

## 2018-07-04 VITALS — BP 118/80 | HR 89 | Temp 97.8°F | Ht 64.0 in | Wt 151.0 lb

## 2018-07-04 DIAGNOSIS — D649 Anemia, unspecified: Secondary | ICD-10-CM

## 2018-07-04 DIAGNOSIS — R600 Localized edema: Secondary | ICD-10-CM

## 2018-07-04 DIAGNOSIS — Z23 Encounter for immunization: Secondary | ICD-10-CM

## 2018-07-04 DIAGNOSIS — I7100 Dissection of unspecified site of aorta: Secondary | ICD-10-CM | POA: Diagnosis not present

## 2018-07-04 DIAGNOSIS — I7101 Dissection of thoracic aorta: Secondary | ICD-10-CM | POA: Diagnosis not present

## 2018-07-04 DIAGNOSIS — I71019 Dissection of thoracic aorta, unspecified: Secondary | ICD-10-CM

## 2018-07-04 LAB — COMPREHENSIVE METABOLIC PANEL
ALT: 23 U/L (ref 0–35)
AST: 22 U/L (ref 0–37)
Albumin: 3.2 g/dL — ABNORMAL LOW (ref 3.5–5.2)
Alkaline Phosphatase: 100 U/L (ref 39–117)
BUN: 11 mg/dL (ref 6–23)
CO2: 29 mEq/L (ref 19–32)
Calcium: 9.2 mg/dL (ref 8.4–10.5)
Chloride: 97 mEq/L (ref 96–112)
Creatinine, Ser: 0.87 mg/dL (ref 0.40–1.20)
GFR: 69.34 mL/min (ref >60.00)
Glucose, Bld: 113 mg/dL — ABNORMAL HIGH (ref 70–99)
Potassium: 3.7 mEq/L (ref 3.5–5.1)
Sodium: 135 mEq/L (ref 135–145)
Total Bilirubin: 0.6 mg/dL (ref 0.2–1.2)
Total Protein: 7.7 g/dL (ref 6.0–8.3)

## 2018-07-04 LAB — CBC
HCT: 33.1 % — ABNORMAL LOW (ref 36.0–46.0)
Hemoglobin: 11 g/dL — ABNORMAL LOW (ref 12.0–15.0)
MCHC: 33.2 g/dL (ref 30.0–36.0)
MCV: 82.9 fl (ref 78.0–100.0)
Platelets: 593 10*3/uL — ABNORMAL HIGH (ref 150.0–400.0)
RBC: 4 Mil/uL (ref 3.87–5.11)
RDW: 15 % (ref 11.5–15.5)
WBC: 8.3 10*3/uL (ref 4.0–10.5)

## 2018-07-04 NOTE — Patient Instructions (Signed)
We will check the labs today and call you back about the results.    

## 2018-07-04 NOTE — Progress Notes (Signed)
   Subjective:    Patient ID: Samantha Stevenson, female    DOB: 1951-04-16, 67 y.o.   MRN: [id number]  HPI The patient is a 67 YO female coming in for hospital follow up (in for extended stay for dissection of thoracic aorta with repair and stenting with slow progression of diet and activity). She is doing fine since leaving the hospital. She is still fatigued quite a bit. She denies constipation or diarrhea. She denies swelling. She is still having some soreness in her chest. She is coughing some as well. She denies SOB but gets tired and needs to rest quite easily. She is eating and drinking well. She denies headaches or migraines. Denies numbness or weakness of limbs.   PMH, [employer], social history reviewed and updated.   Review of Systems  Constitutional: Positive for activity change and fatigue. Negative for appetite change.  HENT: Negative.   Eyes: Negative.   Respiratory: Positive for cough and shortness of breath. Negative for chest tightness.   Cardiovascular: Negative for chest pain, palpitations and leg swelling.  Gastrointestinal: Negative for abdominal distention, abdominal pain, constipation, diarrhea, nausea and vomiting.  Musculoskeletal: Negative.   Skin: Negative.   Neurological: Negative.   Psychiatric/Behavioral: Negative.       Objective:   Physical Exam  Constitutional: She is oriented to person, place, and time. She appears well-developed and well-nourished.  Appears deconditioned on exam  HENT:  Head: Normocephalic and atraumatic.  Eyes: EOM are normal.  Neck: Normal range of motion.  Cardiovascular: Normal rate and regular rhythm.  Pulmonary/Chest: Effort normal. No respiratory distress. She has no wheezes. She has no rales.  LLL rhonchi noted  Abdominal: Soft. Bowel sounds are normal. She exhibits no distension. There is no tenderness. There is no rebound.  Musculoskeletal: She exhibits no edema.  Neurological: She is alert and oriented to person, place, and  time. Coordination normal.  Gait steady  Skin: Skin is warm and dry.  Psychiatric: She has a normal mood and affect.   Vitals:   07/04/18 1022  BP: 118/80  Pulse: 89  Temp: 97.8 F (36.6 C)  TempSrc: Oral  SpO2: 97%  Weight: 151 lb (68.5 kg)  Height: 5\' 4"  (1.626 m)      Assessment & Plan:  Flu shot given at visit

## 2018-07-05 ENCOUNTER — Ambulatory Visit: Payer: Medicare Other | Admitting: Internal Medicine

## 2018-07-05 NOTE — Assessment & Plan Note (Signed)
Doing well, she will she CT surgeon in 1-2 weeks. They will likely check CXR as she had some edema and chest tube in the hospital with wound exploration without leak or cause. She is still coughing a good amount since leaving the hospital.

## 2018-07-05 NOTE — Assessment & Plan Note (Signed)
Improved since leaving hospital and legs are normal on exam today.

## 2018-07-05 NOTE — Assessment & Plan Note (Signed)
Is recovering well and will likely need several months for energy levels to rebound. She is encouraged to remain active and moving around as much as possible.

## 2018-07-05 NOTE — Assessment & Plan Note (Signed)
Checking CBC as she had bleeding with transfusion in the hospital. Adjust as needed. No overt bleeding or blood in stool since leaving the hospital.

## 2018-07-12 ENCOUNTER — Encounter: Payer: Self-pay | Admitting: Vascular Surgery

## 2018-07-12 ENCOUNTER — Other Ambulatory Visit: Payer: Self-pay

## 2018-07-12 ENCOUNTER — Ambulatory Visit (INDEPENDENT_AMBULATORY_CARE_PROVIDER_SITE_OTHER): Payer: Self-pay | Admitting: Vascular Surgery

## 2018-07-12 VITALS — BP 136/81 | HR 77 | Temp 97.0°F | Resp 16 | Ht 64.0 in | Wt 150.0 lb

## 2018-07-12 DIAGNOSIS — I7100 Dissection of unspecified site of aorta: Secondary | ICD-10-CM

## 2018-07-12 NOTE — Progress Notes (Signed)
    Subjective:     Patient ID: Samantha Stevenson, female   DOB: Jun 19, 1951, 67 y.o.   MRN: [id number]  HPI Ms. Coppage returns after having undergone thoracic endograft pain with left subclavian transposition and then having a chest tube placed for lymphatic leak.  Overall she is done very well.  She was on a medium chain triglyceride diet but is since stopped this.  She is breathing well.  She has no left neck swelling.  She is walking without issues.  States that occasionally she feels weak but generally has resumed her normal diet and overall is feeling well.  Her chest and back pain has resolved since surgery   Review of Systems Fatigue    Objective:   Physical Exam Awake alert oriented Left neck staples in place, were removed without issue and incision is clean dry intact There is no swelling of her left neck Palpable left radial pulse and neurologically intact left hand Abdomen is soft nontender nondistended Palpable femoral pulses bilaterally    Assessment:     67 year old female status post hospitalization for intramural hematoma of her thoracic aorta underwent thoracic endograft doing with transposition of her left subclavian artery that was complicated by lymphatic leak.  That has now resolved and she is doing well.    Plan:     She will need follow-up CT scan in 1 month or so to evaluate her endo-grafting.  We will get this scheduled today.  She has issues prior to that she can certainly be seen sooner.  Her staples in her left neck and suture in her left chest were removed today in the office.  Linkoln Alkire C. Donzetta Matters, MD [facility] Office: [phone] Pager: [phone]

## 2018-07-17 ENCOUNTER — Encounter: Payer: Medicare Other | Admitting: Cardiothoracic Surgery

## 2018-07-27 ENCOUNTER — Other Ambulatory Visit: Payer: Self-pay | Admitting: Internal Medicine

## 2018-07-29 ENCOUNTER — Encounter: Payer: Self-pay | Admitting: Family

## 2018-07-29 ENCOUNTER — Encounter (HOSPITAL_COMMUNITY): Payer: Self-pay | Admitting: Internal Medicine

## 2018-07-29 ENCOUNTER — Emergency Department (HOSPITAL_COMMUNITY): Payer: Medicare Other

## 2018-07-29 ENCOUNTER — Ambulatory Visit: Payer: Self-pay | Admitting: *Deleted

## 2018-07-29 ENCOUNTER — Ambulatory Visit (INDEPENDENT_AMBULATORY_CARE_PROVIDER_SITE_OTHER): Payer: Medicare Other | Admitting: Family

## 2018-07-29 ENCOUNTER — Observation Stay (HOSPITAL_COMMUNITY)
Admission: EM | Admit: 2018-07-29 | Discharge: 2018-07-30 | Disposition: A | Discharge status: Home / Self Care | Payer: Medicare Other | Attending: Internal Medicine | Admitting: Internal Medicine

## 2018-07-29 ENCOUNTER — Other Ambulatory Visit: Payer: Self-pay

## 2018-07-29 ENCOUNTER — Encounter (HOSPITAL_COMMUNITY): Payer: Self-pay | Admitting: Emergency Medicine

## 2018-07-29 VITALS — BP 122/70 | HR 80 | Temp 98.1°F | Ht 64.0 in | Wt 148.1 lb

## 2018-07-29 DIAGNOSIS — D649 Anemia, unspecified: Secondary | ICD-10-CM | POA: Diagnosis not present

## 2018-07-29 DIAGNOSIS — Z66 Do not resuscitate: Secondary | ICD-10-CM | POA: Diagnosis not present

## 2018-07-29 DIAGNOSIS — R102 Pelvic and perineal pain: Secondary | ICD-10-CM | POA: Diagnosis not present

## 2018-07-29 DIAGNOSIS — R531 Weakness: Secondary | ICD-10-CM

## 2018-07-29 DIAGNOSIS — R079 Chest pain, unspecified: Secondary | ICD-10-CM | POA: Diagnosis not present

## 2018-07-29 DIAGNOSIS — E785 Hyperlipidemia, unspecified: Secondary | ICD-10-CM | POA: Diagnosis present

## 2018-07-29 DIAGNOSIS — I252 Old myocardial infarction: Secondary | ICD-10-CM | POA: Insufficient documentation

## 2018-07-29 DIAGNOSIS — Z7902 Long term (current) use of antithrombotics/antiplatelets: Secondary | ICD-10-CM | POA: Diagnosis not present

## 2018-07-29 DIAGNOSIS — Z96642 Presence of left artificial hip joint: Secondary | ICD-10-CM | POA: Insufficient documentation

## 2018-07-29 DIAGNOSIS — I7101 Dissection of thoracic aorta: Secondary | ICD-10-CM | POA: Diagnosis present

## 2018-07-29 DIAGNOSIS — Z86711 Personal history of pulmonary embolism: Secondary | ICD-10-CM | POA: Diagnosis not present

## 2018-07-29 DIAGNOSIS — Z7982 Long term (current) use of aspirin: Secondary | ICD-10-CM | POA: Diagnosis not present

## 2018-07-29 DIAGNOSIS — I4891 Unspecified atrial fibrillation: Secondary | ICD-10-CM | POA: Diagnosis present

## 2018-07-29 DIAGNOSIS — I10 Essential (primary) hypertension: Secondary | ICD-10-CM | POA: Diagnosis present

## 2018-07-29 DIAGNOSIS — Z91040 Latex allergy status: Secondary | ICD-10-CM | POA: Insufficient documentation

## 2018-07-29 DIAGNOSIS — I2510 Atherosclerotic heart disease of native coronary artery without angina pectoris: Secondary | ICD-10-CM | POA: Insufficient documentation

## 2018-07-29 DIAGNOSIS — Z79899 Other long term (current) drug therapy: Secondary | ICD-10-CM | POA: Insufficient documentation

## 2018-07-29 DIAGNOSIS — Z886 Allergy status to analgesic agent status: Secondary | ICD-10-CM | POA: Diagnosis not present

## 2018-07-29 DIAGNOSIS — R310 Gross hematuria: Principal | ICD-10-CM | POA: Diagnosis present

## 2018-07-29 DIAGNOSIS — Z880 Allergy status to penicillin: Secondary | ICD-10-CM | POA: Insufficient documentation

## 2018-07-29 DIAGNOSIS — I71019 Dissection of thoracic aorta, unspecified: Secondary | ICD-10-CM | POA: Diagnosis present

## 2018-07-29 DIAGNOSIS — Z7901 Long term (current) use of anticoagulants: Secondary | ICD-10-CM | POA: Insufficient documentation

## 2018-07-29 DIAGNOSIS — R0602 Shortness of breath: Secondary | ICD-10-CM | POA: Insufficient documentation

## 2018-07-29 DIAGNOSIS — I7100 Dissection of unspecified site of aorta: Secondary | ICD-10-CM | POA: Diagnosis present

## 2018-07-29 DIAGNOSIS — Z8249 Family history of ischemic heart disease and other diseases of the circulatory system: Secondary | ICD-10-CM | POA: Diagnosis not present

## 2018-07-29 HISTORY — DX: Personal history of other medical treatment: Z92.89

## 2018-07-29 LAB — CBC
HCT: 35.4 % — ABNORMAL LOW (ref 36.0–46.0)
HCT: 31.3 % — ABNORMAL LOW (ref 36.0–46.0)
Hemoglobin: 10.7 g/dL — ABNORMAL LOW (ref 12.0–15.0)
Hemoglobin: 9.6 g/dL — ABNORMAL LOW (ref 12.0–15.0)
MCH: 26.7 pg (ref 26.0–34.0)
MCH: 26.7 pg (ref 26.0–34.0)
MCHC: 30.2 g/dL (ref 30.0–36.0)
MCHC: 30.7 g/dL (ref 30.0–36.0)
MCV: 88.3 fL (ref 78.0–100.0)
MCV: 86.9 fL (ref 78.0–100.0)
Platelets: 398 10*3/uL (ref 150–400)
Platelets: 349 10*3/uL (ref 150–400)
RBC: 4.01 MIL/uL (ref 3.87–5.11)
RBC: 3.6 MIL/uL — ABNORMAL LOW (ref 3.87–5.11)
RDW: 15.6 % — ABNORMAL HIGH (ref 11.5–15.5)
RDW: 15.4 % (ref 11.5–15.5)
WBC: 6.7 10*3/uL (ref 4.0–10.5)
WBC: 8.2 10*3/uL (ref 4.0–10.5)

## 2018-07-29 LAB — URINALYSIS, MICROSCOPIC (REFLEX): RBC / HPF: >50 RBC/hpf (ref 0–5)

## 2018-07-29 LAB — BASIC METABOLIC PANEL
Anion gap: 8 (ref 5–15)
BUN: 7 mg/dL — ABNORMAL LOW (ref 8–23)
CO2: 26 mmol/L (ref 22–32)
Calcium: 9.1 mg/dL (ref 8.9–10.3)
Chloride: 102 mmol/L (ref 98–111)
Creatinine, Ser: 0.82 mg/dL (ref 0.44–1.00)
GFR calc Af Amer: >60 mL/min (ref >60)
GFR calc non Af Amer: >60 mL/min (ref >60)
Glucose, Bld: 111 mg/dL — ABNORMAL HIGH (ref 70–99)
Potassium: 3.7 mmol/L (ref 3.5–5.1)
Sodium: 136 mmol/L (ref 135–145)

## 2018-07-29 LAB — URINALYSIS, ROUTINE W REFLEX MICROSCOPIC

## 2018-07-29 LAB — TYPE AND SCREEN
ABO/RH(D): B POS
Antibody Screen: NEGATIVE

## 2018-07-29 LAB — DIGOXIN LEVEL: Digoxin Level: 0.9 ng/mL (ref 0.8–2.0)

## 2018-07-29 LAB — I-STAT TROPONIN, ED: Troponin i, poc: 0 ng/mL (ref 0.00–0.08)

## 2018-07-29 MED ORDER — SODIUM CHLORIDE 0.9% FLUSH
3.0000 mL | Freq: Two times a day (BID) | INTRAVENOUS | Status: DC
  Administered 2018-07-30: 3 mL via INTRAVENOUS

## 2018-07-29 MED ORDER — DIGOXIN 125 MCG PO TABS
250.0000 ug | ORAL_TABLET | Freq: Every morning | ORAL | Status: DC
  Administered 2018-07-30: 250 ug via ORAL
  Filled 2018-07-29: qty 2

## 2018-07-29 MED ORDER — ENSURE ENLIVE PO LIQD
237.0000 mL | Freq: Two times a day (BID) | ORAL | Status: DC
  Filled 2018-07-29 (×2): qty 237

## 2018-07-29 MED ORDER — LACTATED RINGERS IV SOLN
INTRAVENOUS | Status: DC
  Administered 2018-07-29 – 2018-07-30 (×2): via INTRAVENOUS

## 2018-07-29 MED ORDER — DOCUSATE SODIUM 100 MG PO CAPS
100.0000 mg | ORAL_CAPSULE | Freq: Two times a day (BID) | ORAL | Status: DC
  Filled 2018-07-29 (×2): qty 1

## 2018-07-29 MED ORDER — ACETAMINOPHEN 325 MG PO TABS: 650.0000 mg | ORAL_TABLET | Freq: Four times a day (QID) | ORAL | Status: DC | PRN

## 2018-07-29 MED ORDER — ACETAMINOPHEN 650 MG RE SUPP
650.0000 mg | Freq: Four times a day (QID) | RECTAL | Status: DC | PRN
  Filled 2018-07-29: qty 1

## 2018-07-29 MED ORDER — ASPIRIN EC 81 MG PO TBEC
81.0000 mg | DELAYED_RELEASE_TABLET | Freq: Every day | ORAL | Status: DC
  Administered 2018-07-30: 81 mg via ORAL
  Filled 2018-07-29: qty 1

## 2018-07-29 MED ORDER — ONDANSETRON HCL 4 MG PO TABS: 4.0000 mg | ORAL_TABLET | Freq: Four times a day (QID) | ORAL | Status: DC | PRN

## 2018-07-29 MED ORDER — OXYCODONE HCL 5 MG PO TABS: 5.0000 mg | ORAL_TABLET | Freq: Four times a day (QID) | ORAL | Status: DC | PRN

## 2018-07-29 MED ORDER — DILTIAZEM HCL ER COATED BEADS 240 MG PO CP24
240.0000 mg | ORAL_CAPSULE | Freq: Every morning | ORAL | Status: DC
  Administered 2018-07-30: 240 mg via ORAL
  Filled 2018-07-29: qty 2
  Filled 2018-07-29: qty 1

## 2018-07-29 MED ORDER — ONDANSETRON HCL 4 MG/2ML IJ SOLN
4.0000 mg | Freq: Four times a day (QID) | INTRAMUSCULAR | Status: DC | PRN
  Administered 2018-07-30: 4 mg via INTRAVENOUS
  Filled 2018-07-29: qty 2

## 2018-07-29 MED ORDER — ROSUVASTATIN CALCIUM 20 MG PO TABS
20.0000 mg | ORAL_TABLET | Freq: Every day | ORAL | Status: DC
  Administered 2018-07-29 – 2018-07-30 (×2): 20 mg via ORAL
  Filled 2018-07-29 (×2): qty 1

## 2018-07-29 NOTE — Progress Notes (Signed)
   Patient here with significant hematuria. She is now far enough out to transition to aspirin from plavix but given recent stenting will need some antiplatelet agent. Her left radial pulse remains palpable and she is neuro in tact. She has f/u with me on 10.18 with CT angio.  Dajon Rowe C. Donzetta Matters, MD [facility] Office: [phone] Pager: [phone]

## 2018-07-29 NOTE — ED Provider Notes (Signed)
Emergency Department Provider Note   I have reviewed the triage vital signs and the nursing notes.   HISTORY  Chief Complaint Hematuria and Chest Pain   HPI Samantha Stevenson is a 67 y.o. female with PMH of A-fib on Xarelto, HLD, HTN, and recent admit with intramural aortic thrombus s/p subclavian to carotid artery transposition with endo-grafting of the left subclavian presents to the ED with hematuria.  Patient states that symptoms began 2 days ago.  She has had gross hematuria over the past 2 days without urinary obstruction symptoms.  She did have some moderate belly pain yesterday but that has resolved.  No fevers or chills.  No active abdominal discomfort.  She has not seen blood in her bowel movements.  She has blood in the urine every time she urinates. She continues to be compliant with the Xarelto and Plavix.    Past Medical History:  Diagnosis Date  . Anemia   . Arthritis   . Atrial fibrillation ([place])   . Degenerative arthritis of hip    s/p L THR 12/2012  . Hyperlipidemia   . Hypertension   . Migraines   . Myocardial infarction ([place]) 2000   due to atrial fib  . Pulmonary emboli ([place]) 12/2012 dx   post op (L THR), anticoag x 72mo    Patient Active Problem List   Diagnosis Date Noted  . Thoracic aortic dissection ([place]) 06/13/2018  . Intramural aortic hematoma ([place]) 05/28/2018  . Dysfunction of left eustachian tube 04/05/2018  . Preoperative clearance 02/27/2018  . Bilateral leg edema 02/27/2018  . Dyspnea 06/19/2016  . Routine general medical examination at a health care facility 06/24/2015  . Constipation 03/17/2015  . Myalgia and myositis 03/17/2015  . Dyslipidemia   . Atrial fibrillation ([place])   . Hypertension   . History of pulmonary embolism 01/10/2013  . Anemia 01/10/2013  . Arthritis 01/03/2013    Past Surgical History:  Procedure Laterality Date  . Breast Ultrasound Left 04/16/13   Done @ breast center Impression: no malignancy appearance noted  on the screen study is consistent with a summation shadow  . CAROTID-SUBCLAVIAN BYPASS GRAFT Left 06/13/2018   Procedure: BYPASS GRAFT CAROTID-SUBCLAVIAN;  Surgeon: Waynetta Sandy, MD;  Location: [facility];  Service: Vascular;  Laterality: Left;  . DG TUMB RIGHT HAND Left    cyst removal  . IR THORACENTESIS ASP PLEURAL SPACE W/IMG GUIDE  05/31/2018  . PARTIAL HYSTERECTOMY    . THORACIC AORTIC ENDOVASCULAR STENT GRAFT Left 06/13/2018   Procedure: LEFT  SUBCLAVIAN TO CAROTID ARTERY TRANSPOSITION; THORACIC AORTIC ENDOVASCULAR STENT GRAFT using GORE CONFORMABLE THORACIC STENT GRAFT AND GORE TAG THORACIC ENDOPROSTHESIS; STENT LEFT COMMON CAROTID ARTERY, RIGHT COMMON-FEMORAL ENDARTERECTOMY WITH PATCH ANGIOPLASTY;  Surgeon: Waynetta Sandy, MD;  Location: [facility];  Service: Vascular;  Laterality: Left;  . TONSILLECTOMY    . TOTAL HIP ARTHROPLASTY Left 01/03/2013   Procedure: TOTAL HIP ARTHROPLASTY ANTERIOR APPROACH;  Surgeon: Mcarthur Rossetti, MD;  Location: [facility];  Service: Orthopedics;  Laterality: Left;  Left Total Hip Arthroplasty, Anterior Approach  . VAGINA RECONSTRUCTION SURGERY     vagina had closed up when 67 years old  . WOUND EXPLORATION Left 06/16/2018   Procedure: LEFT NECK WOUND EXPLORATION, CHEST TUBE INSERTION;  Surgeon: Waynetta Sandy, MD;  Location: [facility];  Service: Vascular;  Laterality: Left;    Allergies Penicillins; Latex; Asa [aspirin]; Celebrex [celecoxib]; and Nsaids  Family History  Problem Relation Age of Onset  . Heart disease  Father   . Diabetes Mother   . Heart disease Mother        pacemaker  . Heart attack Mother 23  . Diabetes Sister   . Alcohol abuse Other   . Heart disease Other   . Hyperlipidemia Other   . Hypertension Other   . Diabetes Other   . Breast cancer Other     Social History Social History   Tobacco Use  . Smoking status: Never Smoker  . Smokeless tobacco: Never Used  Substance Use Topics  . Alcohol use: No   . Drug use: No    Review of Systems  Constitutional: No fever/chills. Positive mild lightheadedness.  Eyes: No visual changes. ENT: No sore throat. Cardiovascular: Denies chest pain.  Respiratory: Denies shortness of breath. Gastrointestinal: Positive abdominal pain.  No nausea, no vomiting.  No diarrhea.  No constipation. Genitourinary: Negative for dysuria. Positive blood in the urine.  Musculoskeletal: Negative for back pain. Skin: Negative for rash. Neurological: Negative for headaches, focal weakness or numbness.  10-point ROS otherwise negative.  ____________________________________________   PHYSICAL EXAM:  VITAL SIGNS: ED Triage Vitals  Enc Vitals Group     BP 07/29/18 1206 139/74     Pulse Rate 07/29/18 1206 87     Resp 07/29/18 1206 14     Temp 07/29/18 1206 98.1 F (36.7 C)     Temp Source 07/29/18 1206 Oral     SpO2 07/29/18 1206 96 %     Weight 07/29/18 1218 147 lb 11.3 oz (67 kg)     Height 07/29/18 1218 5\' 4"  (1.626 m)   Constitutional: Alert and oriented. Well appearing and in no acute distress. Eyes: Conjunctivae are normal. Head: Atraumatic. Nose: No congestion/rhinnorhea. Mouth/Throat: Mucous membranes are moist.  Oropharynx non-erythematous. Neck: No stridor.  Cardiovascular: Normal rate, regular rhythm. Good peripheral circulation. Grossly normal heart sounds.   Respiratory: Normal respiratory effort.  No retractions. Lungs CTAB. Gastrointestinal: Soft and nontender. No distention.  Musculoskeletal: No lower extremity tenderness nor edema. No gross deformities of extremities. Neurologic:  Normal speech and language. No gross focal neurologic deficits are appreciated.  Skin:  Skin is warm, dry and intact. No rash noted.  ____________________________________________   LABS (all labs ordered are listed, but only abnormal results are displayed)  Labs Reviewed  BASIC METABOLIC PANEL - Abnormal; Notable for the following components:      Result  Value   Glucose, Bld 111 (*)    BUN 7 (*)    All other components within normal limits  CBC - Abnormal; Notable for the following components:   Hemoglobin 10.7 (*)    HCT 35.4 (*)    RDW 15.6 (*)    All other components within normal limits  URINALYSIS, ROUTINE W REFLEX MICROSCOPIC - Abnormal; Notable for the following components:   Color, Urine RED (*)    APPearance TURBID (*)    Glucose, UA   (*)    Value: TEST NOT REPORTED DUE TO COLOR INTERFERENCE OF URINE PIGMENT   Hgb urine dipstick   (*)    Value: TEST NOT REPORTED DUE TO COLOR INTERFERENCE OF URINE PIGMENT   Bilirubin Urine   (*)    Value: TEST NOT REPORTED DUE TO COLOR INTERFERENCE OF URINE PIGMENT   Ketones, ur   (*)    Value: TEST NOT REPORTED DUE TO COLOR INTERFERENCE OF URINE PIGMENT   Protein, ur   (*)    Value: TEST NOT REPORTED DUE TO COLOR INTERFERENCE OF  URINE PIGMENT   Nitrite   (*)    Value: TEST NOT REPORTED DUE TO COLOR INTERFERENCE OF URINE PIGMENT   Leukocytes, UA   (*)    Value: TEST NOT REPORTED DUE TO COLOR INTERFERENCE OF URINE PIGMENT   All other components within normal limits  URINALYSIS, MICROSCOPIC (REFLEX) - Abnormal; Notable for the following components:   Bacteria, UA RARE (*)    All other components within normal limits  URINE CULTURE  I-STAT TROPONIN, ED  TYPE AND SCREEN   ____________________________________________  EKG   EKG Interpretation  Date/Time:  Monday July 29 2018 12:25:03 EDT Ventricular Rate:  82 PR Interval:  158 QRS Duration: 74 QT Interval:  322 QTC Calculation: 376 R Axis:   -50 Text Interpretation:  Normal sinus rhythm Left anterior fascicular block Anterior infarct , age undetermined Abnormal ECG No STEMI.  Confirmed by Nanda Quinton [phone]) on 07/29/2018 2:44:10 PM       ____________________________________________  RADIOLOGY  Dg Chest 2 View  Result Date: 07/29/2018 CLINICAL DATA:  Chest pain. Thoracic aortic intramural hematoma stent graft repair  06/16/2018. EXAM: CHEST - 2 VIEW COMPARISON:  06/22/2018 chest radiograph. FINDINGS: Thoracic aortic stent graft and left upper mediastinal stent graft are in place. Surgical clips overlie the supraclavicular left neck. Top-normal heart size. Decreased prominence of the aortic arch contour. Otherwise stable normal mediastinal contour. No pneumothorax. No pleural effusion. Mild curvilinear scarring versus atelectasis at the left lung base, improved from prior. No pulmonary edema. IMPRESSION: Mild left basilar scarring versus atelectasis, improved from prior. Otherwise no active disease. Electronically Signed   By: Ilona Sorrel M.D.   On: 07/29/2018 12:59    ____________________________________________   PROCEDURES  Procedure(s) performed:   Procedures  None ____________________________________________   INITIAL IMPRESSION / ASSESSMENT AND PLAN / ED COURSE  Pertinent labs & imaging results that were available during my care of the patient were reviewed by me and considered in my medical decision making (see chart for details).  Patient presents to the emergency department for evaluation of gross hematuria.  She is on Xarelto and Plavix after arterial transposition as described in prior vascular surgery notes which were reviewed.  Initial insult was an intramural thrombus in the thoracic aorta.  She is not anemic here but having some symptoms of anemia.  No urinary obstruction symptoms. Will reach out to Vascular Surgery for clarification on patient's anticoagulation requirement and to help guide further imaging.   3:59 PM Patient with gross hematuria on UA. Sent for culture but no infection symptoms. Reviewed the case with Dr. Donzetta Matters with vascular surgery. Patient was on Xarelto previously for A-fib. She can discontinue Plavix at this time and transition to ASA s/p the vascular procedure. He will see in consultation during hospitalization.   Spoke with Dr. Jeffie Pollock with Urology. No indication for  additional imaging or catheter placement at this time without obstruction symptoms. He would like the patient to f/u with Urology as an outpatient for cystoscopy. Patient with no severe anemia at this time but plan for obs for Hb trending with patient having some symptoms of anemia at this time.   Discussed patient's case with Hospitalist, Dr. Lorin Mercy to request admission. Patient and family (if present) updated with plan. Care transferred to Hospitalist service.  I reviewed all nursing notes, vitals, pertinent old records, EKGs, labs, imaging (as available).  ____________________________________________  FINAL CLINICAL IMPRESSION(S) / ED DIAGNOSES  Final diagnoses:  Gross hematuria  Generalized weakness    Note:  This document was prepared using Dragon voice recognition software and may include unintentional dictation errors.  Nanda Quinton, MD Emergency Medicine    Kristan Brummitt, [facility], MD 07/29/18 1600

## 2018-07-29 NOTE — H&P (Signed)
History and Physical    Samantha Stevenson DOB: Jul 08, 1951 DOA: 07/29/2018  PCP: Hoyt Koch, MD Consultants:  Donzetta Matters - vascular; Cyndia Bent - CT surgery; [place]; [place] - cardiology Patient coming from:  Home - lives alone normally but she is recuperating at her daughter's house; [facility]: Daughter, [phone]  Chief Complaint: Hematuria  HPI: Samantha Stevenson is a 67 y.o. female with medical history significant of PE; CAD; HTN; HLD; and afib presenting with hematuria.  She has been peeing out blood.  It started Sunday afternoon.  No issues during the morning and then it started acutely in the afternoon.  She did not have any kind of trauma.  She did have pain with urination and felt dizzy.  She voided 3 times yesterday and 3 times today and then also in both the doctor's office and in the ER.  No h/o similar.  She currently feels kind of normal other than mild abdominal discomfort.  She had vaginal pain in the ER with change in position.  She has lightheaded/dizzy with ambulation and also SOB.   ED Course:  Interesting August - intramural thrombus in aorta.  Vasc and CT surgery had to do a procedure with stenting, on Xarelto and Plavix.  Gross hematuria starting 2 days again.  No infection symptoms.  Weak, SOB.  Hgb is 10.7 - ?downtrending due to hematuria.  Dr. Laverta Baltimore spoke with Dr. Donzetta Matters from vascular - ok to stop Plavix and give ASA.  With afib, likely needs to come off Xarelto for now.  Dr. Donzetta Matters will see as a consult.  Urology, Dr. Jeffie Pollock, says no catheter due to no obstruction symptoms.  Likely bladder bleeding, likely needs outpatient cystoscopy.  No current CT (has had recently).  Recommends outpatient f/u.  Hold thinners, home tomorrow maybe.  Review of Systems: As per HPI; otherwise review of systems reviewed and negative.   Ambulatory Status:  Ambulates without assistance  Past Medical History:  Diagnosis Date  . Anemia   . Arthritis   . Atrial  fibrillation ([place])   . Degenerative arthritis of hip    s/p L THR 12/2012  . Hyperlipidemia   . Hypertension   . Migraines   . Myocardial infarction ([place]) 2000   due to atrial fib  . Pulmonary emboli ([place]) 12/2012 dx   post op (L THR), anticoag x 55mo    Past Surgical History:  Procedure Laterality Date  . Breast Ultrasound Left 04/16/13   Done @ breast center Impression: no malignancy appearance noted on the screen study is consistent with a summation shadow  . CAROTID-SUBCLAVIAN BYPASS GRAFT Left 06/13/2018   Procedure: BYPASS GRAFT CAROTID-SUBCLAVIAN;  Surgeon: Waynetta Sandy, MD;  Location: [place];  Service: Vascular;  Laterality: Left;  . DG TUMB RIGHT HAND Left    cyst removal  . IR THORACENTESIS ASP PLEURAL SPACE W/IMG GUIDE  05/31/2018  . PARTIAL HYSTERECTOMY    . THORACIC AORTIC ENDOVASCULAR STENT GRAFT Left 06/13/2018   Procedure: LEFT  SUBCLAVIAN TO CAROTID ARTERY TRANSPOSITION; THORACIC AORTIC ENDOVASCULAR STENT GRAFT using GORE CONFORMABLE THORACIC STENT GRAFT AND GORE TAG THORACIC ENDOPROSTHESIS; STENT LEFT COMMON CAROTID ARTERY, RIGHT COMMON-FEMORAL ENDARTERECTOMY WITH PATCH ANGIOPLASTY;  Surgeon: Waynetta Sandy, MD;  Location: [place];  Service: Vascular;  Laterality: Left;  . TONSILLECTOMY    . TOTAL HIP ARTHROPLASTY Left 01/03/2013   Procedure: TOTAL HIP ARTHROPLASTY ANTERIOR APPROACH;  Surgeon: Mcarthur Rossetti, MD;  [facility];  Service: Orthopedics;  Laterality: Left;  Left Total Hip Arthroplasty, Anterior Approach  . VAGINA RECONSTRUCTION SURGERY     vagina had closed up when 67 years old  . WOUND EXPLORATION Left 06/16/2018   Procedure: LEFT NECK WOUND EXPLORATION, CHEST TUBE INSERTION;  Surgeon: Waynetta Sandy, MD;  Location: [place];  Service: Vascular;  Laterality: Left;    Social History   Socioeconomic History  . Marital status: Single    Spouse name: Not on file  . Number of children: 2  . Years of education: Not on  file  . Highest education level: Not on file  Occupational History  . Occupation: retired  Scientific laboratory technician  . Financial resource strain: Not very hard  . Food insecurity:    Worry: Sometimes true    Inability: Sometimes true  . Transportation needs:    Medical: No    Non-medical: No  Tobacco Use  . Smoking status: Never Smoker  . Smokeless tobacco: Never Used  Substance and Sexual Activity  . Alcohol use: No  . Drug use: No  . Sexual activity: Not Currently  Lifestyle  . Physical activity:    Days per week: 4 days    Minutes per session: 60 min  . Stress: To some extent  Relationships  . Social connections:    Talks on phone: More than three times a week    Gets together: More than three times a week    Attends religious service: More than 4 times per year    Active member of club or organization: Yes    Attends meetings of clubs or organizations: 1 to 4 times per year    Relationship status: Not on file  . Intimate partner violence:    Fear of current or ex partner: Not on file    Emotionally abused: Not on file    Physically abused: Not on file    Forced sexual activity: Not on file  Other Topics Concern  . Not on file  Social History Narrative  . Not on file    Allergies  Allergen Reactions  . Penicillins Swelling and Other (See Comments)    Severe swelling PATIENT HAS HAD A PCN REACTION WITH IMMEDIATE RASH, FACIAL/TONGUE/THROAT SWELLING, SOB, OR LIGHTHEADEDNESS WITH HYPOTENSION:  #  #  YES  #  #  Has patient had a PCN reaction causing severe rash involving mucus membranes or skin necrosis: No PATIENT HAS HAD A PCN REACTION THAT REQUIRED HOSPITALIZATION:  #  #  YES  #  #  Has patient had a PCN reaction occurring within the last 10 years: No  . Latex Hives and Rash  . Asa [Aspirin] Nausea And Vomiting    Makes stomach upset  . Celebrex [Celecoxib] Nausea And Vomiting    GI upset  . Nsaids Rash    Family History  Problem Relation Age of Onset  . Heart  disease Father   . Diabetes Mother   . Heart disease Mother        pacemaker  . Heart attack Mother 66  . Diabetes Sister   . Alcohol abuse Other   . Heart disease Other   . Hyperlipidemia Other   . Hypertension Other   . Diabetes Other   . Breast cancer Other     Prior to Admission medications   Medication Sig Start Date End Date Taking? Authorizing Provider  acetaminophen (TYLENOL) 500 MG tablet Take 1,000 mg by mouth as needed for moderate pain.    Yes [provider]  clopidogrel (  PLAVIX) 75 MG tablet Take 1 tablet (75 mg total) by mouth daily. 06/23/18  Yes Ulyses Amor, PA-C  DIGOX 250 MCG tablet TAKE 1 TABLET BY MOUTH EVERY MORNING 07/29/18  Yes Hoyt Koch, MD  DILT-XR 240 MG 24 hr capsule TAKE 1 CAPSULE BY MOUTH EVERY MORNING 03/06/18  Yes Hoyt Koch, MD  ferrous sulfate 325 (65 FE) MG tablet Take 325 mg by mouth once a week.  01/05/13  Yes Pete Pelt, PA-C  fluticasone (FLONASE) 50 MCG/ACT nasal spray Place 2 sprays into both nostrils daily. Patient taking differently: Place 2 sprays into both nostrils daily as needed for allergies or rhinitis.  03/29/18  Yes Marrian Salvage, FNP  furosemide (LASIX) 20 MG tablet Take 20 mg by mouth daily as needed for fluid.    Yes [provider]  Multiple Vitamin (MULTIVITAMIN) tablet Take 1 tablet by mouth daily.   Yes [provider]  oxyCODONE (OXY IR/ROXICODONE) 5 MG immediate release tablet Take 1 tablet (5 mg total) by mouth every 6 (six) hours as needed for moderate pain or breakthrough pain. 06/23/18  Yes Ulyses Amor, PA-C  rivaroxaban (XARELTO) 20 MG TABS tablet Take 1 tablet (20 mg total) by mouth daily with supper. 02/27/18  Yes Imogene Burn, PA-C  rosuvastatin (CRESTOR) 20 MG tablet TAKE 1 TABLET BY MOUTH EVERY DAY 07/29/18  Yes Hoyt Koch, MD  Amino Acids-Protein Hydrolys (FEEDING SUPPLEMENT, PRO-STAT SUGAR FREE 64,) LIQD Take 30 mLs by mouth 3 (three) times  daily with meals. Patient not taking: Reported on 07/29/2018 06/21/18   Leontine Locket J, PA-C  medium chain triglycerides (MCT OIL) oil Take 15 mLs by mouth 3 (three) times daily. Patient not taking: Reported on 07/29/2018 06/23/18   Ulyses Amor, PA-C    Physical Exam: Vitals:   07/29/18 1500 07/29/18 1530 07/29/18 1658 07/29/18 1725  BP: 120/79 (!) 121/48  122/64  Pulse: 78 80  98  Resp: 19 (!) 21  18  Temp:   98.5 F (36.9 C) 97.7 F (36.5 C)  TempSrc:    Oral  SpO2: 100% 100%  98%  Weight:      Height:         General:  Appears calm and comfortable and is NAD Eyes:  PERRL, EOMI, normal lids, iris ENT:  grossly normal hearing, lips & tongue, mmm; appropriate dentition Neck:  no LAD, masses or thyromegaly; no carotid bruits Cardiovascular:  RRR, no m/r/g. No LE edema.  Respiratory:   CTA bilaterally with no wheezes/rales/rhonchi.  Normal respiratory effort. Abdomen:  soft, mildly TTP along the periumbilical and suprapubic regions, ND, NABS Skin:  no rash or induration seen on limited exam; she has a healing surgical wound on the left clavicular region Musculoskeletal:  grossly normal tone BUE/BLE, good ROM, no bony abnormality Psychiatric:  grossly normal mood and affect, speech fluent and appropriate, AOx3 Neurologic:  CN 2-12 grossly intact, moves all extremities in coordinated fashion, sensation intact    Radiological Exams on Admission: Dg Chest 2 View  Result Date: 07/29/2018 CLINICAL DATA:  Chest pain. Thoracic aortic intramural hematoma stent graft repair 06/16/2018. EXAM: CHEST - 2 VIEW COMPARISON:  06/22/2018 chest radiograph. FINDINGS: Thoracic aortic stent graft and left upper mediastinal stent graft are in place. Surgical clips overlie the supraclavicular left neck. Top-normal heart size. Decreased prominence of the aortic arch contour. Otherwise stable normal mediastinal contour. No pneumothorax. No pleural effusion. Mild curvilinear scarring versus atelectasis  at the  left lung base, improved from prior. No pulmonary edema. IMPRESSION: Mild left basilar scarring versus atelectasis, improved from prior. Otherwise no active disease. Electronically Signed   By: Ilona Sorrel M.D.   On: 07/29/2018 12:59    EKG: Independently reviewed.  NSR with rate 82; LAFB; nonspecific ST changes with no evidence of acute ischemia   Labs on Admission: I have personally reviewed the available labs and imaging studies at the time of the admission.  Pertinent labs:   UA: turbid, bloody; rare bacteria, >50 RBC Glucose 111 Hgb 10.7; 11.0 on 9/12   Assessment/Plan Principal Problem:   Hematuria, gross Active Problems:   Anemia   Dyslipidemia   Atrial fibrillation (HCC)   Hypertension   Intramural aortic hematoma (HCC)   Thoracic aortic dissection (HCC)   Gross hematuria -Patient presenting with acute onset of gross hematuria yesterday -8/19 CTA C/A/P performed and shows unremarkable GU anatomy -Dr. Jeffie Pollock was consulted by telephone and suggests no further imaging at this time -She does not need a catheter if there is no evidence of obstruction -Inpatient cystoscopy while grossly bleeding would likely be low yield; will plan for outpatient urology f/u for cystoscopy after bleeding has subsided if holding blood thinners helps to resolve the problem -Will observe overnight; d/c Plavix; start ASA in the AM; and hold Xarelto (last dose was last night about 8 pm)  Anemia -Chronic anemia with baseline Hgb variable from 8 to 11 -Normocytic -Appears to be stable at this time, but will trend Hgb due to her blood loss - recheck tonight and again in AM -Hopefully will not require transfusion, but she would be willing to be transfused if she needs it -Recent cardiac surgery was non-ischemic but probably would not want her Hgb to trend much lower than 8-9 without considering transfusion  Afib -Rate controlled with Dig, will check Dig level and continue -Also rate  controlled with Dilt, will continue -She is well rate controlled at this time -Will hold Xarelto and consider when to resume at time of d/c  HTN -Continue Dilt  HLD -Continue Crestor  Recent intramural aortic hematoma associated with thoracic aortic dissection -She had a thoracic endograft with L subclavian transposition in 8/19 -She is due for f/u CT in 1 month to evaluate the endografting -Dr. Donzetta Matters has been consulted; he has made recommendations in the chart -For now, he has approved d/c of Plavix with transition to ASA -She has outpatient Vasc f/u with Dr. Donzetta Matters and CT angio on 10/18   DVT prophylaxis: SCDs Code Status:  DNR - confirmed with patient/family Family Communication: Daughter was present throughout evaluation  Disposition Plan:  Home once clinically improved Consults called: Vascular surgery; Urology (telephone only)  Admission status: It is my clinical opinion that referral for OBSERVATION is reasonable and necessary in this patient based on the above information provided. The aforementioned taken together are felt to place the patient at high risk for further clinical deterioration. However it is anticipated that the patient may be medically stable for discharge from the hospital within 24 to 48 hours.     Karmen Bongo MD [facility]  If note is complete, please contact covering daytime or nighttime physician. [email or web address] Password [facility]  07/29/2018, 5:33 PM

## 2018-07-29 NOTE — Progress Notes (Signed)
Samantha Stevenson is a 67 y.o. female with the following history as recorded in EpicCare:  Patient Active Problem List   Diagnosis Date Noted  . Thoracic aortic dissection ([place]) 06/13/2018  . Intramural aortic hematoma ([place]) 05/28/2018  . Dysfunction of left eustachian tube 04/05/2018  . Preoperative clearance 02/27/2018  . Bilateral leg edema 02/27/2018  . Dyspnea 06/19/2016  . Routine general medical examination at a health care facility 06/24/2015  . Constipation 03/17/2015  . Myalgia and myositis 03/17/2015  . Dyslipidemia   . Atrial fibrillation ([place])   . Hypertension   . History of pulmonary embolism 01/10/2013  . Anemia 01/10/2013  . Arthritis 01/03/2013    Current Outpatient Medications  Medication Sig Dispense Refill  . acetaminophen (TYLENOL) 500 MG tablet Take 1,000 mg by mouth as needed for moderate pain.     . Amino Acids-Protein Hydrolys (FEEDING SUPPLEMENT, PRO-STAT SUGAR FREE 64,) LIQD Take 30 mLs by mouth 3 (three) times daily with meals. 900 mL 0  . clopidogrel (PLAVIX) 75 MG tablet Take 1 tablet (75 mg total) by mouth daily. 30 tablet 3  . DIGOX 250 MCG tablet TAKE 1 TABLET BY MOUTH EVERY MORNING 90 tablet 0  . DILT-XR 240 MG 24 hr capsule TAKE 1 CAPSULE BY MOUTH EVERY MORNING 90 capsule 3  . ferrous sulfate 325 (65 FE) MG tablet Take 325 mg by mouth once a week.     . fluticasone (FLONASE) 50 MCG/ACT nasal spray Place 2 sprays into both nostrils daily. 16 g 6  . furosemide (LASIX) 20 MG tablet Take 20 mg by mouth daily as needed for fluid.     . medium chain triglycerides (MCT OIL) oil Take 15 mLs by mouth 3 (three) times daily. 946 mL 12  . Multiple Vitamin (MULTIVITAMIN) tablet Take 1 tablet by mouth daily.    Marland Kitchen oxyCODONE (OXY IR/ROXICODONE) 5 MG immediate release tablet Take 1 tablet (5 mg total) by mouth every 6 (six) hours as needed for moderate pain or breakthrough pain. 30 tablet 0  . rivaroxaban (XARELTO) 20 MG TABS tablet Take 1 tablet (20 mg total) by  mouth daily with supper. 30 tablet 6  . rosuvastatin (CRESTOR) 20 MG tablet TAKE 1 TABLET BY MOUTH EVERY DAY 90 tablet 0   No current facility-administered medications for this visit.     Allergies: Penicillins; Latex; Asa [aspirin]; Celebrex [celecoxib]; and Nsaids  Past Medical History:  Diagnosis Date  . Anemia   . Arthritis   . Atrial fibrillation ([place])   . Degenerative arthritis of hip    s/p L THR 12/2012  . Dyslipidemia   . Hyperlipidemia   . Hypertension   . Hypertension   . Migraines   . Myocardial infarction ([place]) 2000   due to atrial fib  . Pulmonary emboli ([place]) 12/2012 dx   post op (L THR), anticoag x 52mo    Past Surgical History:  Procedure Laterality Date  . Breast Ultrasound Left 04/16/13   Done @ breast center Impression: no malignancy appearance noted on the screen study is consistent with a summation shadow  . CAROTID-SUBCLAVIAN BYPASS GRAFT Left 06/13/2018   Procedure: BYPASS GRAFT CAROTID-SUBCLAVIAN;  Surgeon: Waynetta Sandy, MD;  Location: [place];  Service: Vascular;  Laterality: Left;  . DG TUMB RIGHT HAND Left    cyst removal  . IR THORACENTESIS ASP PLEURAL SPACE W/IMG GUIDE  05/31/2018  . PARTIAL HYSTERECTOMY    . THORACIC AORTIC ENDOVASCULAR STENT GRAFT Left 06/13/2018   Procedure: LEFT  SUBCLAVIAN TO CAROTID ARTERY TRANSPOSITION; THORACIC AORTIC ENDOVASCULAR STENT GRAFT using GORE CONFORMABLE THORACIC STENT GRAFT AND GORE TAG THORACIC ENDOPROSTHESIS; STENT LEFT COMMON CAROTID ARTERY, RIGHT COMMON-FEMORAL ENDARTERECTOMY WITH PATCH ANGIOPLASTY;  Surgeon: Waynetta Sandy, MD;  Location: [place];  Service: Vascular;  Laterality: Left;  . TONSILLECTOMY    . TOTAL HIP ARTHROPLASTY Left 01/03/2013   Procedure: TOTAL HIP ARTHROPLASTY ANTERIOR APPROACH;  Surgeon: Mcarthur Rossetti, MD;  [facility];  Service: Orthopedics;  Laterality: Left;  Left Total Hip Arthroplasty, Anterior Approach  . VAGINA RECONSTRUCTION SURGERY     vagina had  closed up when 67 years old  . WOUND EXPLORATION Left 06/16/2018   Procedure: LEFT NECK WOUND EXPLORATION, CHEST TUBE INSERTION;  Surgeon: Waynetta Sandy, MD;  Location: [facility];  Service: Vascular;  Laterality: Left;    Family History  Problem Relation Age of Onset  . Heart disease Father   . Diabetes Mother   . Heart disease Mother        pacemaker  . Heart attack Mother 64  . Diabetes Sister   . Alcohol abuse Other   . Heart disease Other   . Hyperlipidemia Other   . Hypertension Other   . Diabetes Other   . Breast cancer Other     Social History   Tobacco Use  . Smoking status: Never Smoker  . Smokeless tobacco: Never Used  Substance Use Topics  . Alcohol use: No    Subjective:  Patient is on combination of Eliquis and Plavix; notes that in the past 24 hours, she has started to urinate, "dark blood." Notes that it just looks like there is blood- no urine;  She admits to feeling weak and light-headed; notes that she is having some back and abdominal pain as well. Told the triage nurse when she called in that she felt like she was going to pass out while walking around a store yesterday; was hospitalized in the past month with internal bleeding- hemoglobin had gotten down to 6.0; required blood transfusion; triage nurse recommended that patient go directly to the ER but she opted to come here first;    Objective:  Vitals:   07/29/18 1041  BP: 122/70  Pulse: 80  Temp: 98.1 F (36.7 C)  TempSrc: Oral  SpO2: 98%  Weight: 148 lb 1.9 oz (67.2 kg)  Height: 5\' 4"  (1.626 m)    General: Well developed, well nourished, in no acute distress;   Skin : Warm and dry.  Head: Normocephalic and atraumatic  Lungs: Respirations unlabored; clear to auscultation bilaterally without wheeze, rales, rhonchi  Neurologic: Alert and oriented; speech intact; face symmetrical; moves all extremities well; CNII-XII intact without focal deficit   Assessment:  1. Gross hematuria     Plan:   Due to patient being on Eliquis and Plavix, am concerned for internal bleeding; the urine sample offered today is pure blood as patient described; recommend ER evaluation- she agrees/ daughter will transport patient to [facility].     No follow-ups on file.  No orders of the defined types were placed in this encounter.   Requested Prescriptions    No prescriptions requested or ordered in this encounter

## 2018-07-29 NOTE — Telephone Encounter (Signed)
Please advise if patient should go to ED based on meds she is on

## 2018-07-29 NOTE — Telephone Encounter (Signed)
Pt called with complaints of heavy blood in her urine on 07/28/18; the pt describes it as "dark almost black";  she also felt like she was going to pass out while walking around the store; recommendations made per nurse triage protocol to include seeing a physicain within 4 hours; explained to the pt that because of the medication that she is taking (xarelto and plavix) the ED will have more things at their the disposal as opposed to going to the MD's office;the pt says that she is at her daughter's house recuperating from a long hospitalization and does not she can sit and wait in the ED;  the pt normally sees Dr Sharlet Salina but she has no availability; pt offered and accepted appointment with Jodi Mourning, [facility] 07/29/18 at 1020; also explained to the pt that the office may contact her and direct her to go to the ED; she once again verbalizes understanding; will route to office for notification of this upcoming appointment; also spoke with Sam regarding this appointment; the pt can be contacted at [phone].   Reason for Disposition . Taking Coumadin (warfarin) or other strong blood thinner, or known bleeding disorder (e.g., thrombocytopenia)  Answer Assessment - Initial Assessment Questions 1. COLOR of URINE: "Describe the color of the urine."  (e.g., tea-colored, pink, red, blood clots, bloody)     Dark almost black 2. ONSET: "When did the bleeding start?"      07/28/18 3. EPISODES: "How many times has there been blood in the urine?" or "How many times today?"     07/28/18 x 1; 07/29/18 x 3 4. PAIN with URINATION: "Is there any pain with passing your urine?" If so, ask: "How bad is the pain?"  (Scale 1-10; or mild, moderate, severe)    - MILD - complains slightly about urination hurting    - MODERATE - interferes with normal activities      - SEVERE - excruciating, unwilling or unable to urinate because of the pain      no 5. FEVER: "Do you have a fever?" If so, ask: "What is your temperature, how  was it measured, and when did it start?"     no 6. ASSOCIATED SYMPTOMS: "Are you passing urine more frequently than usual?"     no 7. OTHER SYMPTOMS: "Do you have any other symptoms?" (e.g., back/flank pain, abdominal pain, vomiting)     Stomach ache, lower back pain  8. PREGNANCY: "Is there any chance you are pregnant?" "When was your last menstrual period?"     no  Protocols used: URINE - BLOOD IN-A-AH

## 2018-07-29 NOTE — Progress Notes (Signed)
SCD applied

## 2018-07-29 NOTE — ED Triage Notes (Signed)
Pt states that she has had either vaginal or bleeding from bladder-- recently had 5 stents placed in august. On xeralto and plavix    Hx of transfusion and anemia

## 2018-07-30 DIAGNOSIS — R310 Gross hematuria: Secondary | ICD-10-CM | POA: Diagnosis not present

## 2018-07-30 LAB — BASIC METABOLIC PANEL
Anion gap: 8 (ref 5–15)
BUN: 8 mg/dL (ref 8–23)
CO2: 25 mmol/L (ref 22–32)
Calcium: 8.7 mg/dL — ABNORMAL LOW (ref 8.9–10.3)
Chloride: 105 mmol/L (ref 98–111)
Creatinine, Ser: 0.82 mg/dL (ref 0.44–1.00)
GFR calc Af Amer: >60 mL/min (ref >60)
GFR calc non Af Amer: >60 mL/min (ref >60)
Glucose, Bld: 105 mg/dL — ABNORMAL HIGH (ref 70–99)
Potassium: 3.4 mmol/L — ABNORMAL LOW (ref 3.5–5.1)
Sodium: 138 mmol/L (ref 135–145)

## 2018-07-30 LAB — CBC
HCT: 29.7 % — ABNORMAL LOW (ref 36.0–46.0)
Hemoglobin: 9.1 g/dL — ABNORMAL LOW (ref 12.0–15.0)
MCH: 26.8 pg (ref 26.0–34.0)
MCHC: 30.6 g/dL (ref 30.0–36.0)
MCV: 87.4 fL (ref 80.0–100.0)
Platelets: 315 10*3/uL (ref 150–400)
RBC: 3.4 MIL/uL — ABNORMAL LOW (ref 3.87–5.11)
RDW: 15.5 % (ref 11.5–15.5)
WBC: 7.1 10*3/uL (ref 4.0–10.5)

## 2018-07-30 LAB — URINE CULTURE
Culture: NO GROWTH
Special Requests: NORMAL

## 2018-07-30 MED ORDER — POTASSIUM CHLORIDE CRYS ER 20 MEQ PO TBCR
40.0000 meq | EXTENDED_RELEASE_TABLET | Freq: Once | ORAL | Status: DC
  Filled 2018-07-30: qty 2

## 2018-07-30 NOTE — Progress Notes (Signed)
   Discussed with patient she will need aspirin indefinitely.  Okay for her to not take Plavix.  I will see her on October 18 with CT scan for evaluation.  From a vascular standpoint she is okay for discharge.  Tesha Archambeau C. Donzetta Matters, MD [facility] [place] Office: [phone] Pager: [phone]

## 2018-07-30 NOTE — Discharge Summary (Addendum)
Physician Discharge Summary  [name] IRS:[id number] DOB: Sep 19, 1951 DOA: 07/29/2018  PCP: Hoyt Koch, MD  Admit date: 07/29/2018 Discharge date: 07/30/2018  Time spent: 60 minutes  Recommendations for Outpatient Follow-up:  1. Follow up with Vascular surgery Oct 18 as scheduled 2. Call urology 1-2 days to schedule outpatient cytsoscopy 3. Follow up with PCP 1-2 weeks for evaluation of hypokalemia and anemia   Discharge Diagnoses:  Principal Problem:   Hematuria, gross Active Problems:   Anemia   Dyslipidemia   Atrial fibrillation ([place])   Hypertension   Intramural aortic hematoma (HCC)   Thoracic aortic dissection [facility])   Discharge Condition: stable   Diet recommendation: heart healthy  Filed Weights   07/29/18 1218  Weight: 67 kg    History of present illness:  Samantha Stevenson is a 67 y.o. female with medical history significant of PE; CAD; HTN; HLD; and afib presented to ED on 10/7 with hematuria. she describes voiding frank blood.   It started one day prior.  No issues during the morning and then it started acutely in the afternoon.  She did not have any kind of trauma.  She did have pain with urination and felt dizzy.  She voided 3 times yesterday and 3 times today and then also in both the doctor's office and in the ER.  No h/o similar.  She currently feels kind of normal other than mild abdominal discomfort.  She had vaginal pain in the ER with change in position.  She has lightheaded/dizzy with ambulation and also SOB.  Hospital Course:  Gross hematuria - resolved at discharge -Patient presented with acute onset of gross hematuria -8/19 CTA C/A/P performed and shows unremarkable GU anatomy -Dr. Jeffie Pollock was consulted by telephone and suggested no further imaging at this time -She does not need a catheter if there is no evidence of obstruction -Inpatient cystoscopy while grossly bleeding would likely be low yield; will plan for outpatient urology  f/u for cystoscopy after bleeding has subsided if holding blood thinners helps to resolve the problem - d/c Plavix; start ASA in the AM; and hold Xarelto (last dose was last night about 8 pm) -of note on dual therapy since 06/21/18 I.e. plavix and xarelto planned for 3 months according to discharge summary dated 8/30.  Anemia -Chronic anemia with baseline Hgb variable from 8 to 11 -Normocytic -stable at 9 -Recent cardiac surgery was non-ischemic but probably would not want her Hgb to trend much lower than 8-9 without considering transfusion  Afib -Rate controlled with Dig,  -Also rate controlled with Dilt -She is well rate controlled at this time -resume Xarelto and continue aspirin  HTN -Continue Dilt  HLD -Continue Crestor  Recent intramural aortic hematoma associated with thoracic aortic dissection -She had a thoracic endograft with L subclavian transposition in 8/19 -She is due for f/u CT in 1 month to evaluate the endografting -Dr. Donzetta Matters evaluated and opined ok not to be on plavix and would probably need aspirin indefinitely. Will resume xarelto in 3 days. Need to discuss anticoag plan at follow up appointment 10/18 -She has outpatient Vasc f/u with Dr. Donzetta Matters and CT angio on 10/18    Procedures:  none  Consultations:  Dr Donzetta Matters   Urology (phone)  Discharge Exam: Vitals:   07/30/18 1040 07/30/18 1350  BP: 137/70 (!) 115/57  Pulse: 83 82  Resp: 16 16  Temp: 98.8 F (37.1 C) 97.7 F (36.5 C)  SpO2: 97% 96%    General: awake alert in  no acute distress Cardiovascular: rrr no MGR no LE edema Respiratory: normal effort BS clear bilaterally no wheeze Abdomen: soft non-distended +BS  Discharge Instructions   Discharge Instructions    Call MD for:  difficulty breathing, headache or visual disturbances   Complete by:  As directed    Call MD for:  persistant dizziness or light-headedness   Complete by:  As directed    Call MD for:  persistant nausea and  vomiting   Complete by:  As directed    Call MD for:  temperature >100.4   Complete by:  As directed    Diet - low sodium heart healthy   Complete by:  As directed    Discharge instructions   Complete by:  As directed    Call urology in 1-2 days to schedule appointment for outpatient cystoscopy Return to ED of bloody urine recurrs Follow up with vascular surgery October 18 as scheduled   Increase activity slowly   Complete by:  As directed      Allergies as of 07/30/2018      Reactions   Penicillins Swelling, Other (See Comments)   Severe swelling PATIENT HAS HAD A PCN REACTION WITH IMMEDIATE RASH, FACIAL/TONGUE/THROAT SWELLING, SOB, OR LIGHTHEADEDNESS WITH HYPOTENSION:  #  #  YES  #  #  Has patient had a PCN reaction causing severe rash involving mucus membranes or skin necrosis: No PATIENT HAS HAD A PCN REACTION THAT REQUIRED HOSPITALIZATION:  #  #  YES  #  #  Has patient had a PCN reaction occurring within the last 10 years: No   Latex Hives, Rash   Asa [aspirin] Nausea And Vomiting   Makes stomach upset   Celebrex [celecoxib] Nausea And Vomiting   GI upset   Nsaids Rash      Medication List    TAKE these medications   acetaminophen 500 MG tablet Commonly known as:  TYLENOL Take 1,000 mg by mouth as needed for moderate pain.   DIGOX 0.25 MG tablet Generic drug:  digoxin TAKE 1 TABLET BY MOUTH EVERY MORNING   DILT-XR 240 MG 24 hr capsule Generic drug:  diltiazem TAKE 1 CAPSULE BY MOUTH EVERY MORNING   ferrous sulfate 325 (65 FE) MG tablet Take 325 mg by mouth once a week.   fluticasone 50 MCG/ACT nasal spray Commonly known as:  FLONASE Place 2 sprays into both nostrils daily. What changed:    when to take this  reasons to take this   furosemide 20 MG tablet Commonly known as:  LASIX Take 20 mg by mouth daily as needed for fluid.   multivitamin tablet Take 1 tablet by mouth daily.   oxyCODONE 5 MG immediate release tablet Commonly known as:  Oxy  IR/ROXICODONE Take 1 tablet (5 mg total) by mouth every 6 (six) hours as needed for moderate pain or breakthrough pain.   rivaroxaban 20 MG Tabs tablet Commonly known as:  XARELTO Take 1 tablet (20 mg total) by mouth daily with supper.   rosuvastatin 20 MG tablet Commonly known as:  CRESTOR TAKE 1 TABLET BY MOUTH EVERY DAY      Allergies  Allergen Reactions  . Penicillins Swelling and Other (See Comments)    Severe swelling PATIENT HAS HAD A PCN REACTION WITH IMMEDIATE RASH, FACIAL/TONGUE/THROAT SWELLING, SOB, OR LIGHTHEADEDNESS WITH HYPOTENSION:  #  #  YES  #  #  Has patient had a PCN reaction causing severe rash involving mucus membranes or skin necrosis: No PATIENT HAS  HAD A PCN REACTION THAT REQUIRED HOSPITALIZATION:  #  #  YES  #  #  Has patient had a PCN reaction occurring within the last 10 years: No  . Latex Hives and Rash  . Asa [Aspirin] Nausea And Vomiting    Makes stomach upset  . Celebrex [Celecoxib] Nausea And Vomiting    GI upset  . Nsaids Rash      The results of significant diagnostics from this hospitalization (including imaging, microbiology, ancillary and laboratory) are listed below for reference.    Significant Diagnostic Studies: Dg Chest 2 View  Result Date: 07/29/2018 CLINICAL DATA:  Chest pain. Thoracic aortic intramural hematoma stent graft repair 06/16/2018. EXAM: CHEST - 2 VIEW COMPARISON:  06/22/2018 chest radiograph. FINDINGS: Thoracic aortic stent graft and left upper mediastinal stent graft are in place. Surgical clips overlie the supraclavicular left neck. Top-normal heart size. Decreased prominence of the aortic arch contour. Otherwise stable normal mediastinal contour. No pneumothorax. No pleural effusion. Mild curvilinear scarring versus atelectasis at the left lung base, improved from prior. No pulmonary edema. IMPRESSION: Mild left basilar scarring versus atelectasis, improved from prior. Otherwise no active disease. Electronically Signed    By: Ilona Sorrel M.D.   On: 07/29/2018 12:59    Microbiology: Recent Results (from the past 240 hour(s))  Urine culture     Status: None   Collection Time: 07/29/18  2:30 PM  Result Value Ref Range Status   Specimen Description URINE, CLEAN CATCH  Final   Special Requests Normal  Final   Culture   Final    NO GROWTH Performed at [facility], [address] [place], [place] [address]    Report Status 07/30/2018 FINAL  Final     Labs: Basic Metabolic Panel: Recent Labs  Lab 07/29/18 1221 07/30/18 0526  NA 136 138  K 3.7 3.4*  CL 102 105  CO2 26 25  GLUCOSE 111* 105*  BUN 7* 8  CREATININE 0.82 0.82  CALCIUM 9.1 8.7*   Liver Function Tests: No results for input(s): AST, ALT, ALKPHOS, BILITOT, PROT, ALBUMIN in the last 168 hours. No results for input(s): LIPASE, AMYLASE in the last 168 hours. No results for input(s): AMMONIA in the last 168 hours. CBC: Recent Labs  Lab 07/29/18 1221 07/29/18 2115 07/30/18 0526  WBC 6.7 8.2 7.1  HGB 10.7* 9.6* 9.1*  HCT 35.4* 31.3* 29.7*  MCV 88.3 86.9 87.4  PLT 398 349 315   Cardiac Enzymes: No results for input(s): CKTOTAL, CKMB, CKMBINDEX, TROPONINI in the last 168 hours. BNP: BNP (last 3 results) No results for input(s): BNP in the last 8760 hours.  ProBNP (last 3 results) No results for input(s): PROBNP in the last 8760 hours.  CBG: No results for input(s): GLUCAP in the last 168 hours.     Signed:  Radene Gunning MD.  [facility] 07/30/2018, 3:39 PM

## 2018-07-31 ENCOUNTER — Telehealth: Payer: Self-pay | Admitting: *Deleted

## 2018-07-31 NOTE — Telephone Encounter (Signed)
Transition Care Management Follow-up Telephone Call   Date discharged? 07/30/18   How have you been since you were released from the hospital? Pt states she is doing alright   Do you understand why you were in the hospital? YES   Do you understand the discharge instructions? YES   Where were you discharged to? Home   Items Reviewed:  Medications reviewed: YES  Allergies reviewed: YES  Dietary changes reviewed: YES, heart healthy  Referrals reviewed: YES, she states still waiting on appt w/urologist   Functional Questionnaire:   Activities of Daily Living (ADLs):   She states she are independent in the following: ambulation, bathing and hygiene, feeding, continence, grooming, toileting and dressing States she doesn't require assistance   Any transportation issues/concerns?: NO   Any patient concerns? NO   Confirmed importance and date/time of follow-up visits scheduled YES, appt 08/08/18  Provider Appointment booked with Dr. Sharlet Salina  Confirmed with patient if condition begins to worsen call PCP or go to the ER.  Patient was given the office number and encouraged to call back with question or concerns.  : YES

## 2018-08-08 ENCOUNTER — Encounter: Payer: Self-pay | Admitting: Internal Medicine

## 2018-08-08 ENCOUNTER — Ambulatory Visit (INDEPENDENT_AMBULATORY_CARE_PROVIDER_SITE_OTHER): Payer: Medicare Other | Admitting: Internal Medicine

## 2018-08-08 VITALS — BP 140/80 | HR 92 | Temp 97.3°F | Ht 64.0 in | Wt 149.0 lb

## 2018-08-08 DIAGNOSIS — M7989 Other specified soft tissue disorders: Secondary | ICD-10-CM | POA: Diagnosis not present

## 2018-08-08 DIAGNOSIS — M79662 Pain in left lower leg: Secondary | ICD-10-CM

## 2018-08-08 DIAGNOSIS — R310 Gross hematuria: Secondary | ICD-10-CM | POA: Diagnosis not present

## 2018-08-08 MED ORDER — GABAPENTIN 300 MG PO CAPS: 300.0000 mg | ORAL_CAPSULE | Freq: Two times a day (BID) | ORAL | 0 refills | Status: DC

## 2018-08-08 NOTE — Assessment & Plan Note (Signed)
No recurrence since leaving hospital and will follow up with urology for cystoscopy.

## 2018-08-08 NOTE — Assessment & Plan Note (Signed)
Checking [place] to rule out superficial phlebitis. Gabapentin rx for pain.

## 2018-08-08 NOTE — Progress Notes (Signed)
   Subjective:    Patient ID: Samantha Stevenson, female    DOB: [birth year], 67 y.o.   MRN: [id number]  HPI The patient is a 67 YO female coming in for hospital follow up (in for gross hematuria, stopped plavix, resumed aspirin 81 mg and held xarelto for about 5 days, CT abdomen not abnormal and CXR without changes). She denies recurrent bleeding at home. She is taking xarelto currently and aspirin. She denies chest pains. Still coughing at night time. Using flonase which helps some with nasal drainage. Denies fevers or chills. Denies abdominal pain or diarrhea or constipation. Seeing urology for follow up and vascular surgery tomorrow (recent dissection thoracic aorta). She is still fatigued and SOB with activity. She does have new problems since hospital. She has had left arm swelling and pain since IV stick in the hospital. This is not as swollen now but hurting with stinging pains intermittent. This is keeping her from sleeping. She is also having some swelling and pain in the left lower leg and calf. This is also somewhat burning and stinging and can shoot all the way from foot to hip. She has tried tylenol and oxycodone which did not help the pain. Cannot take NSAIDs.   PMH, [facility], social history reviewed and updated.   Review of Systems  Constitutional: Positive for activity change, appetite change and fatigue.  HENT: Negative.   Eyes: Negative.   Respiratory: Positive for cough and shortness of breath. Negative for chest tightness.   Cardiovascular: Positive for leg swelling. Negative for chest pain and palpitations.  Gastrointestinal: Negative for abdominal distention, abdominal pain, constipation, diarrhea, nausea and vomiting.  Genitourinary: Negative for dysuria, frequency and hematuria.  Musculoskeletal: Positive for joint swelling and myalgias. Negative for arthralgias, back pain, neck pain and neck stiffness.  Skin: Negative.   Neurological: Negative.   Psychiatric/Behavioral:  Negative.       Objective:   Physical Exam  Constitutional: She is oriented to person, place, and time. She appears well-developed and well-nourished.  HENT:  Head: Normocephalic and atraumatic.  Eyes: EOM are normal.  Neck: Normal range of motion.  Cardiovascular: Normal rate and regular rhythm.  Pulmonary/Chest: Effort normal and breath sounds normal. No respiratory distress. She has no wheezes. She has no rales.  Abdominal: Soft. Bowel sounds are normal. She exhibits no distension. There is no tenderness. There is no rebound.  Musculoskeletal: She exhibits edema and tenderness.  Edema in the left forearm and tenderness to palpation. No abscess or purulent drainage appreciated over the needle stick site. Left leg with 2-3+ edema, pain in the shin area and mild in the calf, asymmetric from the right calf.   Neurological: She is alert and oriented to person, place, and time. Coordination normal.  Skin: Skin is warm and dry.  Psychiatric: She has a normal mood and affect.   Vitals:   08/08/18 1042  BP: 140/80  Pulse: 92  Temp: (!) 97.3 F (36.3 C)  TempSrc: Oral  SpO2: 99%  Weight: 149 lb (67.6 kg)  Height: 5\' 4"  (1.626 m)      Assessment & Plan:

## 2018-08-08 NOTE — Patient Instructions (Signed)
We are going to get an ultrasound of the arm and leg.   We have sent in gabapentin to take 1 pill at night time for 3 days then you can increase to 1 pill twice a day if needed for pain.

## 2018-08-08 NOTE — Assessment & Plan Note (Addendum)
Ordered US given that this started while off xarelto. She is currently on xarelto again so even if DVT this would not affect treatment just risk for future blood clot (prior PE). Trying gabapentin for pain.

## 2018-08-09 ENCOUNTER — Ambulatory Visit
Admission: RE | Admit: 2018-08-09 | Discharge: 2018-08-09 | Disposition: A | Discharge status: Home / Self Care | Payer: Medicare Other | Source: Ambulatory Visit | Attending: Vascular Surgery | Admitting: Vascular Surgery

## 2018-08-09 ENCOUNTER — Encounter: Payer: Self-pay | Admitting: Vascular Surgery

## 2018-08-09 ENCOUNTER — Ambulatory Visit (INDEPENDENT_AMBULATORY_CARE_PROVIDER_SITE_OTHER): Payer: Medicare Other | Admitting: Vascular Surgery

## 2018-08-09 ENCOUNTER — Other Ambulatory Visit: Payer: Self-pay | Admitting: *Deleted

## 2018-08-09 ENCOUNTER — Other Ambulatory Visit: Payer: Self-pay

## 2018-08-09 VITALS — BP 133/74 | HR 81 | Temp 97.6°F | Resp 16 | Ht 64.0 in | Wt 151.0 lb

## 2018-08-09 DIAGNOSIS — I7100 Dissection of unspecified site of aorta: Secondary | ICD-10-CM

## 2018-08-09 DIAGNOSIS — I7101 Dissection of thoracic aorta: Secondary | ICD-10-CM | POA: Diagnosis not present

## 2018-08-09 DIAGNOSIS — R109 Unspecified abdominal pain: Secondary | ICD-10-CM | POA: Diagnosis not present

## 2018-08-09 MED ORDER — IOPAMIDOL (ISOVUE-300) INJECTION 61%
75.0000 mL | Freq: Once | INTRAVENOUS | Status: AC | PRN
  Administered 2018-08-09: 75 mL via INTRAVENOUS

## 2018-08-09 NOTE — Progress Notes (Signed)
Patient ID: Samantha Stevenson, female   DOB: 03-21-1951, 67 y.o.   MRN: [id number]  Reason for Consult: Follow-up (CT prior)   Referred by Hoyt Koch, *  Subjective:     HPI:  Samantha Stevenson is a 67 y.o. female with history of thoracic and renal hematoma that was repaired with thoracic endograft and with subclavian transposition left carotid stenting.  Since that time she has been readmitted with hematuria.  She is maintained on Xarelto.  She has a history of DVT in the.  She now has acute left lower extremity pain that began a week ago she has not had any studies for this.  She does take her Xarelto she says.  She is also on Plavix.  She continues to have pain in her left arm where she had an IV stick in the hospital.  Overall she is able to eat but she has had some setbacks along the way.  CT scan was performed prior to today's visit.  Past Medical History:  Diagnosis Date  . Anemia   . Arthritis    "qwhere" (07/29/2018)  . Atrial fibrillation ([place])   . Degenerative arthritis of hip    s/p L THR 12/2012  . History of blood transfusion 05/2018   "related to OR"  . Hyperlipidemia   . Hypertension   . Migraines   . Myocardial infarction ([place]) 2000   due to atrial fib  . Pulmonary emboli ([place]) 12/2012 dx   post op (L THR), anticoag x 56mo   Family History  Problem Relation Age of Onset  . Heart disease Father   . Diabetes Mother   . Heart disease Mother        pacemaker  . Heart attack Mother 89  . Diabetes Sister   . Alcohol abuse Other   . Heart disease Other   . Hyperlipidemia Other   . Hypertension Other   . Diabetes Other   . Breast cancer Other    Past Surgical History:  Procedure Laterality Date  . ABDOMINAL HYSTERECTOMY     "w/1 tube"  . Breast Ultrasound Left 04/16/13   Done @ breast center Impression: no malignancy appearance noted on the screen study is consistent with a summation shadow  . CARDIAC CATHETERIZATION    . CAROTID-SUBCLAVIAN BYPASS  GRAFT Left 06/13/2018   Procedure: BYPASS GRAFT CAROTID-SUBCLAVIAN;  Surgeon: Waynetta Sandy, MD;  Location: [facility];  Service: Vascular;  Laterality: Left;  . DG TUMB RIGHT HAND Left    cyst removal  . IR THORACENTESIS ASP PLEURAL SPACE W/IMG GUIDE  05/31/2018  . JOINT REPLACEMENT    . THORACIC AORTIC ENDOVASCULAR STENT GRAFT Left 06/13/2018   Procedure: LEFT  SUBCLAVIAN TO CAROTID ARTERY TRANSPOSITION; THORACIC AORTIC ENDOVASCULAR STENT GRAFT using GORE CONFORMABLE THORACIC STENT GRAFT AND GORE TAG THORACIC ENDOPROSTHESIS; STENT LEFT COMMON CAROTID ARTERY, RIGHT COMMON-FEMORAL ENDARTERECTOMY WITH PATCH ANGIOPLASTY;  Surgeon: Waynetta Sandy, MD;  Location: [facility];  Service: Vascular;  Laterality: Left;  . TONSILLECTOMY    . TOTAL HIP ARTHROPLASTY Left 01/03/2013   Procedure: TOTAL HIP ARTHROPLASTY ANTERIOR APPROACH;  Surgeon: Mcarthur Rossetti, MD;  Location: [facility];  Service: Orthopedics;  Laterality: Left;  Left Total Hip Arthroplasty, Anterior Approach  . TUBAL LIGATION    . VAGINA RECONSTRUCTION SURGERY  1966   vagina had closed up when 67 years old  . WOUND EXPLORATION Left 06/16/2018   Procedure: LEFT NECK WOUND EXPLORATION, CHEST TUBE INSERTION;  Surgeon: Waynetta Sandy, MD;  Location: [facility];  Service: Vascular;  Laterality: Left;    Short Social History:  Social History   Tobacco Use  . Smoking status: Never Smoker  . Smokeless tobacco: Never Used  Substance Use Topics  . Alcohol use: Not Currently    Allergies  Allergen Reactions  . Penicillins Swelling and Other (See Comments)    Severe swelling PATIENT HAS HAD A PCN REACTION WITH IMMEDIATE RASH, FACIAL/TONGUE/THROAT SWELLING, SOB, OR LIGHTHEADEDNESS WITH HYPOTENSION:  #  #  YES  #  #  Has patient had a PCN reaction causing severe rash involving mucus membranes or skin necrosis: No PATIENT HAS HAD A PCN REACTION THAT REQUIRED HOSPITALIZATION:  #  #  YES  #  #  Has patient had a PCN reaction  occurring within the last 10 years: No  . Latex Hives and Rash  . Asa [Aspirin] Nausea And Vomiting    Makes stomach upset  . Celebrex [Celecoxib] Nausea And Vomiting    GI upset  . Nsaids Rash    Current Outpatient Medications  Medication Sig Dispense Refill  . acetaminophen (TYLENOL) 500 MG tablet Take 1,000 mg by mouth as needed for moderate pain.     Marland Kitchen DIGOX 250 MCG tablet TAKE 1 TABLET BY MOUTH EVERY MORNING 90 tablet 0  . DILT-XR 240 MG 24 hr capsule TAKE 1 CAPSULE BY MOUTH EVERY MORNING 90 capsule 3  . ferrous sulfate 325 (65 FE) MG tablet Take 325 mg by mouth once a week.     . fluticasone (FLONASE) 50 MCG/ACT nasal spray Place 2 sprays into both nostrils daily. (Patient taking differently: Place 2 sprays into both nostrils daily as needed for allergies or rhinitis. ) 16 g 6  . furosemide (LASIX) 20 MG tablet Take 20 mg by mouth daily as needed for fluid.     Marland Kitchen gabapentin (NEURONTIN) 300 MG capsule Take 1 capsule (300 mg total) by mouth 2 (two) times daily. 60 capsule 0  . Multiple Vitamin (MULTIVITAMIN) tablet Take 1 tablet by mouth daily.    Marland Kitchen oxyCODONE (OXY IR/ROXICODONE) 5 MG immediate release tablet Take 1 tablet (5 mg total) by mouth every 6 (six) hours as needed for moderate pain or breakthrough pain. 30 tablet 0  . rivaroxaban (XARELTO) 20 MG TABS tablet Take 1 tablet (20 mg total) by mouth daily with supper. 30 tablet 6  . rosuvastatin (CRESTOR) 20 MG tablet TAKE 1 TABLET BY MOUTH EVERY DAY 90 tablet 0   No current facility-administered medications for this visit.     Review of Systems  Constitutional:  Constitutional negative. HENT: HENT negative.  Eyes: Eyes negative.  Respiratory: Positive for cough.  Cardiovascular: Positive for leg swelling.  GI: Gastrointestinal negative.  Musculoskeletal: Positive for gait problem, leg pain and joint pain.  Skin: Skin negative.  Neurological: Positive for focal weakness and numbness.  Hematologic: Hematologic/lymphatic  negative.  Psychiatric: Psychiatric negative.        Objective:  Objective   Vitals:   08/09/18 1540  BP: 133/74  Pulse: 81  Resp: 16  Temp: 97.6 F (36.4 C)  TempSrc: Oral  SpO2: 100%  Weight: 151 lb (68.5 kg)  Height: 5\' 4"  (1.626 m)   Body mass index is 25.92 kg/m.  Physical Exam  Constitutional: She is oriented to person, place, and time. She appears well-developed.  HENT:  Head: Normocephalic.  Eyes: Pupils are equal, round, and reactive to light.  Neck: Normal range of motion. Neck supple.  Well-healed incision without  swelling and there is no palpable mass  Cardiovascular: Normal rate.  Pulses:      Radial pulses are 2+ on the right side, and 2+ on the left side.  Abdominal: Soft.  Neurological: She is alert and oriented to person, place, and time.  Skin: Skin is dry.  Psychiatric: She has a normal mood and affect. Her behavior is normal. Judgment and thought content normal.    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  The vertebral artery is patent.     Assessment/Plan:     68 year old female follows up with CT angios to evaluate her recent thoracic intermural hematoma repair.  At the time of surgery her arteries were much like tissue paper.  She required stenting or carotid artery.  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  We will proceed with angiogram from the left brachial approach in the operating room.  She will possibly require stenting versus coil embolization versus open exploration of the time of surgery.  I discussed with her the risk and benefits she agrees to proceed.  We will also get a DVT study prior to this.     Waynetta Sandy MD [facility]

## 2018-08-13 ENCOUNTER — Other Ambulatory Visit: Payer: Self-pay

## 2018-08-13 ENCOUNTER — Encounter (HOSPITAL_COMMUNITY): Payer: Self-pay

## 2018-08-13 ENCOUNTER — Encounter (HOSPITAL_COMMUNITY)
Admission: RE | Admit: 2018-08-13 | Discharge: 2018-08-13 | Disposition: A | Discharge status: Home / Self Care | Payer: Medicare Other | Source: Ambulatory Visit | Attending: Vascular Surgery | Admitting: Vascular Surgery

## 2018-08-13 DIAGNOSIS — M79603 Pain in arm, unspecified: Secondary | ICD-10-CM | POA: Diagnosis not present

## 2018-08-13 DIAGNOSIS — I7100 Dissection of unspecified site of aorta: Secondary | ICD-10-CM | POA: Diagnosis not present

## 2018-08-13 HISTORY — DX: Dyspnea, unspecified: R06.00

## 2018-08-13 HISTORY — DX: Cardiac arrhythmia, unspecified: I49.9

## 2018-08-13 LAB — COMPREHENSIVE METABOLIC PANEL
ALT: 14 U/L (ref 0–44)
AST: 22 U/L (ref 15–41)
Albumin: 3 g/dL — ABNORMAL LOW (ref 3.5–5.0)
Alkaline Phosphatase: 89 U/L (ref 38–126)
Anion gap: 7 (ref 5–15)
BUN: 10 mg/dL (ref 8–23)
CO2: 27 mmol/L (ref 22–32)
Calcium: 9.3 mg/dL (ref 8.9–10.3)
Chloride: 105 mmol/L (ref 98–111)
Creatinine, Ser: 0.88 mg/dL (ref 0.44–1.00)
GFR calc Af Amer: >60 mL/min (ref >60)
GFR calc non Af Amer: >60 mL/min (ref >60)
Glucose, Bld: 104 mg/dL — ABNORMAL HIGH (ref 70–99)
Potassium: 3.3 mmol/L — ABNORMAL LOW (ref 3.5–5.1)
Sodium: 139 mmol/L (ref 135–145)
Total Bilirubin: 0.8 mg/dL (ref 0.3–1.2)
Total Protein: 7.5 g/dL (ref 6.5–8.1)

## 2018-08-13 LAB — URINALYSIS, ROUTINE W REFLEX MICROSCOPIC
Bilirubin Urine: NEGATIVE
Glucose, UA: NEGATIVE mg/dL
Ketones, ur: NEGATIVE mg/dL
Leukocytes, UA: NEGATIVE
Nitrite: NEGATIVE
Protein, ur: 100 mg/dL — AB
Specific Gravity, Urine: 1.025 (ref 1.005–1.030)
pH: 5 (ref 5.0–8.0)

## 2018-08-13 LAB — CBC
HCT: 35.4 % — ABNORMAL LOW (ref 36.0–46.0)
Hemoglobin: 10.3 g/dL — ABNORMAL LOW (ref 12.0–15.0)
MCH: 25.8 pg — ABNORMAL LOW (ref 26.0–34.0)
MCHC: 29.1 g/dL — ABNORMAL LOW (ref 30.0–36.0)
MCV: 88.5 fL (ref 80.0–100.0)
Platelets: 515 10*3/uL — ABNORMAL HIGH (ref 150–400)
RBC: 4 MIL/uL (ref 3.87–5.11)
RDW: 16.1 % — ABNORMAL HIGH (ref 11.5–15.5)
WBC: 8.5 10*3/uL (ref 4.0–10.5)
nRBC: 0 % (ref 0.0–0.2)

## 2018-08-13 LAB — SURGICAL PCR SCREEN
MRSA, PCR: NEGATIVE
Staphylococcus aureus: NEGATIVE

## 2018-08-13 LAB — TYPE AND SCREEN
ABO/RH(D): B POS
Antibody Screen: NEGATIVE

## 2018-08-13 LAB — APTT: aPTT: 38 seconds — ABNORMAL HIGH (ref 24–36)

## 2018-08-13 LAB — PROTIME-INR
INR: 1.24
Prothrombin Time: 15.4 seconds — ABNORMAL HIGH (ref 11.4–15.2)

## 2018-08-13 NOTE — Pre-Procedure Instructions (Signed)
Donnae Covel  08/13/2018     Your procedure is scheduled on Thursday, October 24.  Report to [facility] Admitting at 7:15 AM                  Your surgery or procedure is scheduled for 9:15 A.M.   Call this number if you have problems the morning of surgery: [phone]  This is the number for the Pre- Surgical Desk.    Remember:  Do not eat or drink after midnight Wednesday, October 23.                  Take these medicines the morning of surgery with A SIP OF WATER:             [place]              DILT-XR             gabapentin (NEURONTIN)             rosuvastatin (CRESTOR)                 Take if needed: acetaminophen (TYLENOL)  oxyCODONE (OXY IR/ROXICODONE)  Xarelto should have been stopped Monday, October 21.   STOP taking Aspirin, Aspirin Products (Goody Powder, Excedrin Migraine), Ibuprofen (Advil), Naproxen (Aleve), Vitamins and Herbal Products (ie Fish Oil)     [facility]- Preparing For Surgery  Before surgery, you can play an important role. Because skin is not sterile, your skin needs to be as free of germs as possible. You can reduce the number of germs on your skin by washing with CHG (chlorahexidine gluconate) Soap before surgery.  CHG is an antiseptic cleaner which kills germs and bonds with the skin to continue killing germs even after washing.    Oral Hygiene is also important to reduce your risk of infection.  Remember - BRUSH YOUR TEETH THE MORNING OF SURGERY WITH YOUR REGULAR TOOTHPASTE  Please do not use if you have an allergy to CHG or antibacterial soaps. If your skin becomes reddened/irritated stop using the CHG.  Do not shave (including legs and underarms) for at least 48 hours prior to first CHG shower. It is OK to shave your face.  Please follow these instructions carefully.   1. Shower the NIGHT BEFORE SURGERY and the MORNING OF SURGERY with CHG.   2. If you chose to wash your hair, wash your hair first as usual with your  normal shampoo.  3. After you shampoo, wash your face and private area with the soap you use at home, then rinse. your hair and body thoroughly to remove the shampoo and soap.  4. Use CHG as you would any other liquid soap. You can apply CHG directly to the skin and wash gently with a scrungie or a clean washcloth.   Apply the CHG Soap to your body ONLY FROM THE NECK DOWN.  Do not use on open wounds or open sores. Avoid contact with your eyes, ears, mouth and genitals (private parts).  5. Wash thoroughly, paying special attention to the area where your surgery will be performed.  6. Thoroughly rinse your body with warm water from the neck down.  7. DO NOT shower/wash with your normal soap after using and rinsing off the CHG Soap.  8. Pat yourself dry with a CLEAN TOWEL.  9. Wear CLEAN PAJAMAS to bed the night before surgery, wear comfortable clothes the morning of surgery  10. Place CLEAN SHEETS  on your bed the night of your first shower and DO NOT SLEEP WITH PETS.  Day of Surgery:    Shower as written above  Do not apply any deodorants/lotions, powders or colognes..  Please wear clean clothes to the hospital/surgery center.   Remember to brush your teeth WITH YOUR REGULAR TOOTHPASTE.             Do not wear jewelry, make-up or nail polish.  Do not shave 48 hours prior to surgery.  Men may shave face and neck.  Do not bring valuables to the hospital.  [facility] is not responsible for any belongings or valuables.  Contacts, dentures or bridgework may not be worn into surgery.  Leave your suitcase in the car.  After surgery it may be brought to your room.  For patients admitted to the hospital, discharge time will be determined by your treatment team.  Patients discharged the day of surgery will not be allowed to drive home.   Please read over the following fact sheets that you were given.

## 2018-08-14 ENCOUNTER — Other Ambulatory Visit: Payer: Self-pay

## 2018-08-14 ENCOUNTER — Encounter (HOSPITAL_COMMUNITY): Payer: Self-pay | Admitting: Anesthesiology

## 2018-08-14 ENCOUNTER — Ambulatory Visit (HOSPITAL_COMMUNITY)
Admission: RE | Admit: 2018-08-14 | Discharge: 2018-08-14 | Disposition: A | Discharge status: Home / Self Care | Payer: Medicare Other | Source: Ambulatory Visit | Attending: Vascular Surgery | Admitting: Vascular Surgery

## 2018-08-14 ENCOUNTER — Ambulatory Visit (INDEPENDENT_AMBULATORY_CARE_PROVIDER_SITE_OTHER)
Admission: RE | Admit: 2018-08-14 | Discharge: 2018-08-14 | Disposition: A | Discharge status: Home / Self Care | Payer: Medicare Other | Source: Ambulatory Visit | Attending: Vascular Surgery | Admitting: Vascular Surgery

## 2018-08-14 DIAGNOSIS — M79603 Pain in arm, unspecified: Secondary | ICD-10-CM | POA: Insufficient documentation

## 2018-08-14 DIAGNOSIS — I7100 Dissection of unspecified site of aorta: Secondary | ICD-10-CM | POA: Diagnosis not present

## 2018-08-14 DIAGNOSIS — M79602 Pain in left arm: Secondary | ICD-10-CM | POA: Diagnosis not present

## 2018-08-14 NOTE — Progress Notes (Signed)
Anesthesia Chart Review:   Case:  [id number] Date/Time:  08/15/18 0904   Procedures:      SUBCLAVIAN PSEUDOANEURYSM STENTING VERSUS COILING EMBOLECTOMY (Left )     REPAIR FALSE ANEURYSM SUBCLAVIAN (Left )   Anesthesia type:  General   Pre-op diagnosis:  left sublcavian pseudoaneurysm   Location:  [facility] OR ROOM 16 / [facility] OR   Surgeon:  Waynetta Sandy, MD      DISCUSSION: Patient is a 67 year old female scheduled for the above procedure. History includes never smoker, HTN, HLD, CAD (MI '00 in setting of afib), afib (diagnosed 2017), type B intramural hematoma of thoracic aortic extending from left SCA to renal artery with 3. 8 cm descending TAA (s/p thoracic aortic endovascular stent graft with left SCA coverage and left CCA stent, carotid subclavian bypass graft 06/13/18--noted to have left SCA pseudoaneurysm 08/09/18), anemia, dyspnea, DVT/PE (post left THR 12/2012),  - Admitted 07/29/18-07/30/18 for gross hematuria, resolved at discharge. Was on Plavix and Xarelto at the time, so Plavix changed to ASA (although not on current med list) and Xarelto temporarily held.  Telephone consult with urologist Dr. Jeffie Pollock recommended outpatient urology follow-up for cystoscopy as 05/2018 CTA showed unremarkable GU anatomy.   If no acute changes then I anticipate that she can proceed as planned. Per [facility], hold Xarelto for 48 hours prior to surgery.    VS: BP 127/65   Pulse 77   Temp (!) 36.4 C   Resp 18   Ht 5\' 4"  (1.626 m)   Wt 67.6 kg   SpO2 98%   BMI 25.58 kg/m    PROVIDERS: Hoyt Koch, MD is PCP Allegra Lai, MD is EP cardiologist. Last visit 5/819 with Ermalinda Barrios, PA-C on 02/27/18.    LABS: Labs reviewed: Acceptable for surgery. T&S done.  (all labs ordered are listed, but only abnormal results are displayed)  Labs Reviewed  APTT - Abnormal; Notable for the following components:      Result Value   aPTT 38 (*)    All other components within normal limits  CBC - Abnormal;  Notable for the following components:   Hemoglobin 10.3 (*)    HCT 35.4 (*)    MCH 25.8 (*)    MCHC 29.1 (*)    RDW 16.1 (*)    Platelets 515 (*)    All other components within normal limits  COMPREHENSIVE METABOLIC PANEL - Abnormal; Notable for the following components:   Potassium 3.3 (*)    Glucose, Bld 104 (*)    Albumin 3.0 (*)    All other components within normal limits  PROTIME-INR - Abnormal; Notable for the following components:   Prothrombin Time 15.4 (*)    All other components within normal limits  URINALYSIS, ROUTINE W REFLEX MICROSCOPIC - Abnormal; Notable for the following components:   Color, Urine AMBER (*)    APPearance CLOUDY (*)    Hgb urine dipstick SMALL (*)    Protein, ur 100 (*)    Bacteria, UA RARE (*)    All other components within normal limits  SURGICAL PCR SCREEN  TYPE AND SCREEN     IMAGES: CTA chest/abd/pelvis 08/09/18: IMPRESSION: - Status post stent graft repair of the thoracic type B intramural hematoma extending from the native left subclavian origin to the diaphragmatic hiatus. Stent graft remains patent. Improvement in the intramural hematoma. No evidence of endoleak. No mediastinal hemorrhage or hematoma. - Status post intravascular stent of the left common carotid artery and  transposition of the left subclavian artery, all remain patent. - Lobular soft tissue abnormality with central vascular enhancement in the left supraclavicular region measuring 20 x 22 mm highly suspicious for a surgical site localized pseudoaneurysm possibly from branches of the thyrocervical or costocervical trunk. - Stable appearance of the abdominal aorta atherosclerosis but no significant aneurysm or occlusive process. - Mesenteric and renal vasculature remain patent. - No significant iliac disease. - No other acute intrathoracic, abdominal, or pelvic finding.   EKG: 07/29/18: NSR, LAFB, anterior infarct (undetermined).   CV: LUE venous U/S  08/14/18: Summary: Left: No evidence of deep or superficial vein thrombosis in the upper extremity.  BLE venous U/S 08/14/18: Summary: Bilateral: No evidence of acute deep vein thrombosis. No color flow or doppler signal in the right saphenofemoral junction with what appears to partial compression of the proximal greater saphenous vein, age undetermined, probably chronic.  Carotid U/S 06/11/18: Final Interpretation: Right Carotid: Velocities in the right ICA are consistent with a 40-59%        stenosis. Left Carotid: Velocities in the left ICA are consistent with a 1-39% stenosis. Vertebrals: Bilateral vertebral arteries demonstrate antegrade flow.  Echo 05/28/18 (ordered by Dr. Prescott Gum):  Study Conclusions - HPI and indications: 441.01 Dissection of Aorta , Thoracic. - Left ventricle: The cavity size was normal. Systolic function was   normal. The estimated ejection fraction was in the range of 55%   to 60%. Wall motion was normal; there were no regional wall   motion abnormalities. Doppler parameters are consistent with   abnormal left ventricular relaxation (grade 1 diastolic   dysfunction). - Aortic valve: Trileaflet; mildly thickened leaflets. There was no   regurgitation. - Aortic root: There was evidence of aortic dissection flap noted   in the descending thoracic aorta (0.37cm flap posterior). Seen on   CT scan. - Mitral valve: Calcified annulus. - Tricuspid valve: There was mild regurgitation. - Pulmonary arteries: Systolic pressure was mildly increased. PA   peak pressure: 36 mm Hg (S). - Pericardium, extracardiac: A trivial pericardial effusion was   identified posterior to the heart.   Past Medical History:  Diagnosis Date  . Anemia   . Arthritis    "qwhere" (07/29/2018)  . Atrial fibrillation ([place])   . Degenerative arthritis of hip    s/p L THR 12/2012  . Dyspnea    since surgery in August 2019- "when I get worked up and walk a short distance"  .  Dysrhythmia   . History of blood transfusion 05/2018   "related to OR"  . Hyperlipidemia   . Hypertension   . Migraines    mIgraines- none since blood pressure and lipids are under control  . Myocardial infarction ([place]) 2000   due to atrial fib  . Pulmonary emboli ([place]) 12/2012 dx   post op (L THR), anticoag x 74mo    Past Surgical History:  Procedure Laterality Date  . ABDOMINAL HYSTERECTOMY     "w/1 tube"  . Breast Ultrasound Left 04/16/13   Done @ breast center Impression: no malignancy appearance noted on the screen study is consistent with a summation shadow  . CARDIAC CATHETERIZATION    . CAROTID-SUBCLAVIAN BYPASS GRAFT Left 06/13/2018   Procedure: BYPASS GRAFT CAROTID-SUBCLAVIAN;  Surgeon: Waynetta Sandy, MD;  Location: [facility];  Service: Vascular;  Laterality: Left;  . DG TUMB RIGHT HAND Left    cyst removal  . IR THORACENTESIS ASP PLEURAL SPACE W/IMG GUIDE  05/31/2018  . THORACIC AORTIC  ENDOVASCULAR STENT GRAFT Left 06/13/2018   Procedure: LEFT  SUBCLAVIAN TO CAROTID ARTERY TRANSPOSITION; THORACIC AORTIC ENDOVASCULAR STENT GRAFT using GORE CONFORMABLE THORACIC STENT GRAFT AND GORE TAG THORACIC ENDOPROSTHESIS; STENT LEFT COMMON CAROTID ARTERY, RIGHT COMMON-FEMORAL ENDARTERECTOMY WITH PATCH ANGIOPLASTY;  Surgeon: Waynetta Sandy, MD;  Location: [facility];  Service: Vascular;  Laterality: Left;  . TONSILLECTOMY    . TOTAL HIP ARTHROPLASTY Left 01/03/2013   Procedure: TOTAL HIP ARTHROPLASTY ANTERIOR APPROACH;  Surgeon: Mcarthur Rossetti, MD;  Location: [facility];  Service: Orthopedics;  Laterality: Left;  Left Total Hip Arthroplasty, Anterior Approach  . TUBAL LIGATION    . VAGINA RECONSTRUCTION SURGERY  1966   vagina had closed up when 67 years old  . WOUND EXPLORATION Left 06/16/2018   Procedure: LEFT NECK WOUND EXPLORATION, CHEST TUBE INSERTION;  Surgeon: Waynetta Sandy, MD;  Location: [facility] OR;  Service: Vascular;  Laterality: Left;    MEDICATIONS: .  acetaminophen (TYLENOL) 500 MG tablet  . [place] 250 MCG tablet  . DILT-XR 240 MG 24 hr capsule  . ferrous sulfate 325 (65 FE) MG tablet  . fluticasone (FLONASE) 50 MCG/ACT nasal spray  . furosemide (LASIX) 20 MG tablet  . gabapentin (NEURONTIN) 300 MG capsule  . Multiple Vitamin (MULTIVITAMIN) tablet  . oxyCODONE (OXY IR/ROXICODONE) 5 MG immediate release tablet  . rivaroxaban (XARELTO) 20 MG TABS tablet  . rosuvastatin (CRESTOR) 20 MG tablet   No current facility-administered medications for this encounter.     George Hugh [facility] Short Stay Center/Anesthesiology Phone [phone] 08/14/2018 4:54 PM

## 2018-08-14 NOTE — Anesthesia Preprocedure Evaluation (Addendum)
Anesthesia Evaluation  Patient identified by MRN, date of birth, ID band Patient awake    Reviewed: Allergy & Precautions, NPO status , Patient's Chart, lab work & pertinent test results  Airway Mallampati: II  TM Distance: >3 FB Neck ROM: Full    Dental  (+) Dental Advisory Given, Upper Dentures, Partial Lower   Pulmonary PE   Pulmonary exam normal breath sounds clear to auscultation       Cardiovascular hypertension, Pt. on medications + Past MI and + Peripheral Vascular Disease (Left subclavian pseudoaneurysm)  Normal cardiovascular exam+ dysrhythmias Atrial Fibrillation  Rhythm:Regular Rate:Normal     Neuro/Psych  Headaches,  Neuromuscular disease negative psych ROS   GI/Hepatic negative GI ROS, Neg liver ROS,   Endo/Other  negative endocrine ROS  Renal/GU negative Renal ROS     Musculoskeletal  (+) Arthritis ,   Abdominal   Peds  Hematology  (+) Blood dyscrasia (Xarelto; Thrombocytosis), anemia ,   Anesthesia Other Findings Day of surgery medications reviewed with the patient.  Reproductive/Obstetrics                           Anesthesia Physical Anesthesia Plan  ASA: III  Anesthesia Plan: General   Post-op Pain Management:    Induction: Intravenous  PONV Risk Score and Plan: 3 and Midazolam, Dexamethasone and Ondansetron  Airway Management Planned: Oral ETT  Additional Equipment: Arterial line  Intra-op Plan:   Post-operative Plan: Extubation in OR  Informed Consent: I have reviewed the patients History and Physical, chart, labs and discussed the procedure including the risks, benefits and alternatives for the proposed anesthesia with the patient or authorized representative who has indicated his/her understanding and acceptance.   Dental advisory given  Plan Discussed with: CRNA  Anesthesia Plan Comments:        Anesthesia Quick Evaluation

## 2018-08-15 ENCOUNTER — Inpatient Hospital Stay (HOSPITAL_COMMUNITY): Payer: Medicare Other | Admitting: Anesthesiology

## 2018-08-15 ENCOUNTER — Inpatient Hospital Stay (HOSPITAL_COMMUNITY): Payer: Medicare Other | Admitting: Vascular Surgery

## 2018-08-15 ENCOUNTER — Ambulatory Visit (HOSPITAL_COMMUNITY)
Admission: RE | Admit: 2018-08-15 | Discharge: 2018-08-15 | Disposition: A | Discharge status: Home / Self Care | Payer: Medicare Other | Source: Ambulatory Visit | Attending: Vascular Surgery | Admitting: Vascular Surgery

## 2018-08-15 ENCOUNTER — Encounter (HOSPITAL_COMMUNITY)
Admission: RE | Disposition: A | Discharge status: Home / Self Care | Payer: Self-pay | Source: Ambulatory Visit | Attending: Vascular Surgery

## 2018-08-15 ENCOUNTER — Encounter (HOSPITAL_COMMUNITY): Payer: Self-pay | Admitting: Urology

## 2018-08-15 DIAGNOSIS — Z7901 Long term (current) use of anticoagulants: Secondary | ICD-10-CM | POA: Diagnosis not present

## 2018-08-15 DIAGNOSIS — Z90710 Acquired absence of both cervix and uterus: Secondary | ICD-10-CM | POA: Insufficient documentation

## 2018-08-15 DIAGNOSIS — Z79899 Other long term (current) drug therapy: Secondary | ICD-10-CM | POA: Insufficient documentation

## 2018-08-15 DIAGNOSIS — Z888 Allergy status to other drugs, medicaments and biological substances status: Secondary | ICD-10-CM | POA: Insufficient documentation

## 2018-08-15 DIAGNOSIS — I252 Old myocardial infarction: Secondary | ICD-10-CM | POA: Diagnosis not present

## 2018-08-15 DIAGNOSIS — Z886 Allergy status to analgesic agent status: Secondary | ICD-10-CM | POA: Insufficient documentation

## 2018-08-15 DIAGNOSIS — Z880 Allergy status to penicillin: Secondary | ICD-10-CM | POA: Insufficient documentation

## 2018-08-15 DIAGNOSIS — M1712 Unilateral primary osteoarthritis, left knee: Secondary | ICD-10-CM | POA: Diagnosis not present

## 2018-08-15 DIAGNOSIS — Z91040 Latex allergy status: Secondary | ICD-10-CM | POA: Insufficient documentation

## 2018-08-15 DIAGNOSIS — I10 Essential (primary) hypertension: Secondary | ICD-10-CM | POA: Diagnosis not present

## 2018-08-15 DIAGNOSIS — I728 Aneurysm of other specified arteries: Secondary | ICD-10-CM | POA: Diagnosis not present

## 2018-08-15 DIAGNOSIS — I4891 Unspecified atrial fibrillation: Secondary | ICD-10-CM | POA: Diagnosis not present

## 2018-08-15 DIAGNOSIS — Z86711 Personal history of pulmonary embolism: Secondary | ICD-10-CM | POA: Insufficient documentation

## 2018-08-15 DIAGNOSIS — Z96642 Presence of left artificial hip joint: Secondary | ICD-10-CM | POA: Insufficient documentation

## 2018-08-15 DIAGNOSIS — I721 Aneurysm of artery of upper extremity: Secondary | ICD-10-CM | POA: Diagnosis not present

## 2018-08-15 DIAGNOSIS — E785 Hyperlipidemia, unspecified: Secondary | ICD-10-CM | POA: Insufficient documentation

## 2018-08-15 DIAGNOSIS — Z9889 Other specified postprocedural states: Secondary | ICD-10-CM | POA: Diagnosis not present

## 2018-08-15 DIAGNOSIS — Z951 Presence of aortocoronary bypass graft: Secondary | ICD-10-CM | POA: Insufficient documentation

## 2018-08-15 HISTORY — PX: ARTERY REPAIR: SHX559

## 2018-08-15 SURGERY — ANGIOGRAM EXTREMITY LEFT
Anesthesia: General | Site: Arm Upper | Laterality: Left

## 2018-08-15 MED ORDER — ROCURONIUM BROMIDE 10 MG/ML (PF) SYRINGE
PREFILLED_SYRINGE | INTRAVENOUS | Status: DC | PRN
  Administered 2018-08-15: 35 mg via INTRAVENOUS
  Administered 2018-08-15: 15 mg via INTRAVENOUS

## 2018-08-15 MED ORDER — LACTATED RINGERS IV SOLN
INTRAVENOUS | Status: DC | PRN
  Administered 2018-08-15: 10:00:00 via INTRAVENOUS

## 2018-08-15 MED ORDER — FENTANYL CITRATE (PF) 250 MCG/5ML IJ SOLN
INTRAMUSCULAR | Status: DC | PRN
  Administered 2018-08-15 (×2): 75 ug via INTRAVENOUS

## 2018-08-15 MED ORDER — IODIXANOL 320 MG/ML IV SOLN
INTRAVENOUS | Status: DC | PRN
  Administered 2018-08-15: 15 mL via INTRA_ARTERIAL

## 2018-08-15 MED ORDER — VANCOMYCIN HCL IN DEXTROSE 1-5 GM/200ML-% IV SOLN
1000.0000 mg | INTRAVENOUS | Status: AC
  Administered 2018-08-15: 1000 mg via INTRAVENOUS

## 2018-08-15 MED ORDER — LIDOCAINE 2% (20 MG/ML) 5 ML SYRINGE
INTRAMUSCULAR | Status: AC
  Filled 2018-08-15: qty 5

## 2018-08-15 MED ORDER — PROPOFOL 10 MG/ML IV BOLUS
INTRAVENOUS | Status: AC
  Filled 2018-08-15: qty 20

## 2018-08-15 MED ORDER — ONDANSETRON HCL 4 MG/2ML IJ SOLN
INTRAMUSCULAR | Status: DC | PRN
  Administered 2018-08-15: 4 mg via INTRAVENOUS

## 2018-08-15 MED ORDER — HEPARIN SODIUM (PORCINE) 1000 UNIT/ML IJ SOLN
INTRAMUSCULAR | Status: DC | PRN
  Administered 2018-08-15: 7000 [IU] via INTRAVENOUS

## 2018-08-15 MED ORDER — PROTAMINE SULFATE 10 MG/ML IV SOLN
INTRAVENOUS | Status: AC
  Filled 2018-08-15: qty 5

## 2018-08-15 MED ORDER — PROTAMINE SULFATE 10 MG/ML IV SOLN
INTRAVENOUS | Status: DC | PRN
  Administered 2018-08-15: 10 mg via INTRAVENOUS
  Administered 2018-08-15: 15 mg via INTRAVENOUS

## 2018-08-15 MED ORDER — SODIUM CHLORIDE 0.9 % IV SOLN
INTRAVENOUS | Status: DC | PRN
  Administered 2018-08-15: 11:00:00

## 2018-08-15 MED ORDER — PROPOFOL 10 MG/ML IV BOLUS
INTRAVENOUS | Status: DC | PRN
  Administered 2018-08-15: 100 mg via INTRAVENOUS

## 2018-08-15 MED ORDER — VANCOMYCIN HCL IN DEXTROSE 1-5 GM/200ML-% IV SOLN
INTRAVENOUS | Status: AC
  Filled 2018-08-15: qty 200

## 2018-08-15 MED ORDER — SODIUM CHLORIDE 0.9 % IV SOLN: INTRAVENOUS | Status: DC

## 2018-08-15 MED ORDER — 0.9 % SODIUM CHLORIDE (POUR BTL) OPTIME
TOPICAL | Status: DC | PRN
  Administered 2018-08-15: 1000 mL

## 2018-08-15 MED ORDER — ONDANSETRON HCL 4 MG/2ML IJ SOLN: 4.0000 mg | Freq: Once | INTRAMUSCULAR | Status: DC | PRN

## 2018-08-15 MED ORDER — PHENYLEPHRINE 40 MCG/ML (10ML) SYRINGE FOR IV PUSH (FOR BLOOD PRESSURE SUPPORT)
PREFILLED_SYRINGE | INTRAVENOUS | Status: AC
  Filled 2018-08-15: qty 10

## 2018-08-15 MED ORDER — ROCURONIUM BROMIDE 50 MG/5ML IV SOSY
PREFILLED_SYRINGE | INTRAVENOUS | Status: AC
  Filled 2018-08-15: qty 5

## 2018-08-15 MED ORDER — FENTANYL CITRATE (PF) 100 MCG/2ML IJ SOLN: 25.0000 ug | INTRAMUSCULAR | Status: DC | PRN

## 2018-08-15 MED ORDER — SODIUM CHLORIDE 0.9 % IV SOLN
INTRAVENOUS | Status: DC | PRN
  Administered 2018-08-15: 25 ug/min via INTRAVENOUS

## 2018-08-15 MED ORDER — MIDAZOLAM HCL 5 MG/5ML IJ SOLN
INTRAMUSCULAR | Status: DC | PRN
  Administered 2018-08-15: 2 mg via INTRAVENOUS

## 2018-08-15 MED ORDER — DEXAMETHASONE SODIUM PHOSPHATE 10 MG/ML IJ SOLN
INTRAMUSCULAR | Status: DC | PRN
  Administered 2018-08-15: 10 mg via INTRAVENOUS

## 2018-08-15 MED ORDER — SUGAMMADEX SODIUM 200 MG/2ML IV SOLN
INTRAVENOUS | Status: DC | PRN
  Administered 2018-08-15: 270.4 mg via INTRAVENOUS

## 2018-08-15 MED ORDER — ONDANSETRON HCL 4 MG/2ML IJ SOLN
INTRAMUSCULAR | Status: AC
  Filled 2018-08-15: qty 2

## 2018-08-15 MED ORDER — SODIUM CHLORIDE 0.9 % IV SOLN
INTRAVENOUS | Status: AC
  Filled 2018-08-15: qty 1.2

## 2018-08-15 MED ORDER — CHLORHEXIDINE GLUCONATE CLOTH 2 % EX PADS: 6.0000 | MEDICATED_PAD | Freq: Once | CUTANEOUS | Status: DC

## 2018-08-15 MED ORDER — LACTATED RINGERS IV SOLN
INTRAVENOUS | Status: DC
  Administered 2018-08-15: 08:00:00 via INTRAVENOUS

## 2018-08-15 MED ORDER — LIDOCAINE 2% (20 MG/ML) 5 ML SYRINGE
INTRAMUSCULAR | Status: DC | PRN
  Administered 2018-08-15: 80 mg via INTRAVENOUS

## 2018-08-15 MED ORDER — FENTANYL CITRATE (PF) 250 MCG/5ML IJ SOLN
INTRAMUSCULAR | Status: AC
  Filled 2018-08-15: qty 5

## 2018-08-15 MED ORDER — OXYCODONE-ACETAMINOPHEN 7.5-325 MG PO TABS: 1.0000 | ORAL_TABLET | Freq: Four times a day (QID) | ORAL | 0 refills | Status: DC | PRN

## 2018-08-15 MED ORDER — DEXAMETHASONE SODIUM PHOSPHATE 10 MG/ML IJ SOLN
INTRAMUSCULAR | Status: AC
  Filled 2018-08-15: qty 1

## 2018-08-15 MED ORDER — MIDAZOLAM HCL 2 MG/2ML IJ SOLN
INTRAMUSCULAR | Status: AC
  Filled 2018-08-15: qty 2

## 2018-08-15 MED ORDER — HEPARIN SODIUM (PORCINE) 1000 UNIT/ML IJ SOLN
INTRAMUSCULAR | Status: AC
  Filled 2018-08-15: qty 1

## 2018-08-15 SURGICAL SUPPLY — 42 items
ADH SKN CLS APL DERMABOND .7 (GAUZE/BANDAGES/DRESSINGS) ×2
CANISTER SUCT 3000ML PPV (MISCELLANEOUS) ×4 IMPLANT
CATH BEACON 5 .035 65 KMP TIP (CATHETERS) ×3 IMPLANT
CLIP VESOCCLUDE MED 24/CT (CLIP) ×4 IMPLANT
CLIP VESOCCLUDE SM WIDE 24/CT (CLIP) ×4 IMPLANT
COVER DOME SNAP 22 D (MISCELLANEOUS) ×3 IMPLANT
COVER PROBE W GEL 5X96 (DRAPES) ×3 IMPLANT
COVER WAND RF STERILE (DRAPES) ×4 IMPLANT
DERMABOND ADVANCED (GAUZE/BANDAGES/DRESSINGS) ×2
DERMABOND ADVANCED .7 DNX12 (GAUZE/BANDAGES/DRESSINGS) ×2 IMPLANT
DRAIN CHANNEL 15F RND FF W/TCR (WOUND CARE) IMPLANT
ELECT REM PT RETURN 9FT ADLT (ELECTROSURGICAL) ×4
ELECTRODE REM PT RTRN 9FT ADLT (ELECTROSURGICAL) ×2 IMPLANT
EVACUATOR SILICONE 100CC (DRAIN) IMPLANT
GLOVE BIO SURGEON STRL SZ7.5 (GLOVE) ×4 IMPLANT
GOWN STRL REUS W/ TWL LRG LVL3 (GOWN DISPOSABLE) ×4 IMPLANT
GOWN STRL REUS W/ TWL XL LVL3 (GOWN DISPOSABLE) ×2 IMPLANT
GOWN STRL REUS W/TWL LRG LVL3 (GOWN DISPOSABLE) ×8
GOWN STRL REUS W/TWL XL LVL3 (GOWN DISPOSABLE) ×4
KIT BASIN OR (CUSTOM PROCEDURE TRAY) ×4 IMPLANT
KIT TURNOVER KIT B (KITS) ×4 IMPLANT
NS IRRIG 1000ML POUR BTL (IV SOLUTION) ×8 IMPLANT
PACK CAROTID (CUSTOM PROCEDURE TRAY) ×3 IMPLANT
PACK PERIPHERAL VASCULAR (CUSTOM PROCEDURE TRAY) ×4 IMPLANT
PAD ARMBOARD 7.5X6 YLW CONV (MISCELLANEOUS) ×8 IMPLANT
PROTECTION STATION PRESSURIZED (MISCELLANEOUS) ×4
SET MICROPUNCTURE 5F STIFF (MISCELLANEOUS) ×3 IMPLANT
SHEATH PINNACLE 5F 10CM (SHEATH) ×3 IMPLANT
STATION PROTECTION PRESSURIZED (MISCELLANEOUS) ×1 IMPLANT
STOCKINETTE 6  STRL (DRAPES) ×2
STOCKINETTE 6 STRL (DRAPES) ×1 IMPLANT
SUT MNCRL AB 4-0 PS2 18 (SUTURE) ×1 IMPLANT
SUT PROLENE 5 0 C 1 24 (SUTURE) ×5 IMPLANT
SUT PROLENE 6 0 BV (SUTURE) ×1 IMPLANT
SUT VIC AB 2-0 CT1 27 (SUTURE)
SUT VIC AB 2-0 CT1 TAPERPNT 27 (SUTURE) ×2 IMPLANT
SUT VIC AB 3-0 SH 27 (SUTURE) ×4
SUT VIC AB 3-0 SH 27X BRD (SUTURE) ×3 IMPLANT
TOWEL GREEN STERILE (TOWEL DISPOSABLE) ×4 IMPLANT
TRAY FOLEY MTR SLVR 16FR STAT (SET/KITS/TRAYS/PACK) ×4 IMPLANT
WATER STERILE IRR 1000ML POUR (IV SOLUTION) ×4 IMPLANT
WIRE BENTSON .035X145CM (WIRE) ×3 IMPLANT

## 2018-08-15 NOTE — Transfer of Care (Signed)
Immediate Anesthesia Transfer of Care Note  Patient: Samantha Stevenson  Procedure(s) Performed: Left Upper Extremity ANGIOGRAM (Left Arm Upper) LEFT BRACHIAL ARTERY EXPLORATION (Left Arm Upper)  Patient Location: PACU  Anesthesia Type:General  Level of Consciousness: awake, alert , oriented and patient cooperative  Airway & Oxygen Therapy: Patient Spontanous Breathing and Patient connected to nasal cannula oxygen  Post-op Assessment: Report given to RN and Post -op Vital signs reviewed and stable  Post vital signs: Reviewed and stable  Last Vitals:  Vitals Value Taken Time  BP 127/56 08/15/2018 11:28 AM  Temp    Pulse 79 08/15/2018 11:30 AM  Resp 16 08/15/2018 11:30 AM  SpO2 100 % 08/15/2018 11:30 AM  Vitals shown include unvalidated device data.  Last Pain:  Vitals:   08/15/18 0752  TempSrc: Oral  PainSc:          Complications: No apparent anesthesia complications

## 2018-08-15 NOTE — H&P (Signed)
   History and Physical Update  The patient was interviewed and re-examined.  The patient's previous History and Physical has been reviewed and is unchanged from recent office visit. Plan for operative repair with possible endo, open or hybrid approach. Discussed risks and benefits and she agrees to proceed.   Aram Domzalski C. Donzetta Matters, MD [facility] Office: [phone] Pager: [phone]    08/15/2018, 9:27 AM

## 2018-08-15 NOTE — Anesthesia Procedure Notes (Signed)
Procedure Name: Intubation Date/Time: 08/15/2018 10:11 AM Performed by: Renato Shin, CRNA Pre-anesthesia Checklist: Patient identified, Emergency Drugs available, Suction available and Patient being monitored Patient Re-evaluated:Patient Re-evaluated prior to induction Oxygen Delivery Method: Circle system utilized Preoxygenation: Pre-oxygenation with 100% oxygen Induction Type: IV induction Ventilation: Mask ventilation without difficulty Laryngoscope Size: Miller and 3 Grade View: Grade I Tube type: Oral Tube size: 7.0 mm Number of attempts: 1 Airway Equipment and Method: Stylet Placement Confirmation: ETT inserted through vocal cords under direct vision,  positive ETCO2,  CO2 detector and breath sounds checked- equal and bilateral Secured at: 21 cm Tube secured with: Tape Dental Injury: Teeth and Oropharynx as per pre-operative assessment

## 2018-08-15 NOTE — Op Note (Signed)
    Patient name: Samantha Stevenson MRN: [id number] DOB: 1950-12-27 Sex: female  08/15/2018 Pre-operative Diagnosis: Left supraclavicular pseudoaneurysm Post-operative diagnosis:  Same Surgeon:  Erlene Quan C. Donzetta Matters, MD Procedure Performed: 1.  Exposure left brachial artery 2.  Left upper extremity angiogram  Indications: 68 year old female was previously admitted with the thoracic IMH complicated by persistent pain and she underwent subclavian artery transposition with common carotid artery stenting and thoracic endografting.  Most recent CT scan demonstrated pseudoaneurysm in the supraclavicular area likely from a branch artery.  She is now indicated for angiogram with possible open versus endovascular repair.  Findings: Common carotid artery stent as well as subclavian artery transposition are patent.  There is no identifiable pseudoaneurysm.  I also used ultrasound but could not identify it there either.   Procedure:  The patient was identified in the holding area and taken to the operating room where she was placed supine the operating table and general anesthesia was induced.  She was sterilely prepped and draped in the left neck and chest and left upper extremity given antibiotics and a timeout was called.  We began with a longitudinal incision above the antecubitum dissected down to the brachial artery placed a vessel loop around this.  Patient was given 7000 units of heparin.  U stitch was placed in the artery was cannulated with a micropuncture needle and a wire and sheath were placed.  We exchanged for a Bentson wire a 5 [place] sheath.  We then used a bare catheter and Bentson wire to traverse up to the subclavian transposition area.  We performed angiography with delayed fluoroscopy to attempt to identify the pseudoaneurysm but I could not identify it.  With this I did not undertake any intervention.  I did use ultrasound during the case to attempt to identify that way but also could not.  It is  possible that it is either thrombosed or the flow is so low that it could possibly thrombosed in the future.  Plan will be to follow her up with CT scan and she would need consideration of percutaneous embolization if it persist.  25 mg of protamine was given.  I irrigated the wound obtained hemostasis closed in 2 layers of Vicryl and Monocryl.  She was then awakened from anesthesia having tolerated the procedure well without immediate complication.  All counts were correct at completion.  EBL 10 cc.   Aubreana Cornacchia C. Donzetta Matters, MD [facility] Office: [phone] Pager: [phone]

## 2018-08-16 ENCOUNTER — Encounter (HOSPITAL_COMMUNITY): Payer: Self-pay | Admitting: Vascular Surgery

## 2018-08-16 ENCOUNTER — Other Ambulatory Visit: Payer: Self-pay

## 2018-08-16 DIAGNOSIS — M79603 Pain in arm, unspecified: Secondary | ICD-10-CM

## 2018-08-16 DIAGNOSIS — I7100 Dissection of unspecified site of aorta: Secondary | ICD-10-CM

## 2018-08-23 DIAGNOSIS — Z1231 Encounter for screening mammogram for malignant neoplasm of breast: Secondary | ICD-10-CM | POA: Diagnosis not present

## 2018-08-23 DIAGNOSIS — Z803 Family history of malignant neoplasm of breast: Secondary | ICD-10-CM | POA: Diagnosis not present

## 2018-08-23 LAB — HM MAMMOGRAPHY

## 2018-08-23 NOTE — Anesthesia Postprocedure Evaluation (Signed)
Anesthesia Post Note  Patient: Benjamine Mola Cress  Procedure(s) Performed: Left Upper Extremity ANGIOGRAM (Left Arm Upper) LEFT BRACHIAL ARTERY EXPLORATION (Left Arm Upper)     Patient location during evaluation: PACU Anesthesia Type: General Level of consciousness: awake and alert Pain management: pain level controlled Vital Signs Assessment: post-procedure vital signs reviewed and stable Respiratory status: spontaneous breathing, nonlabored ventilation, respiratory function stable and patient connected to nasal cannula oxygen Cardiovascular status: blood pressure returned to baseline and stable Postop Assessment: no apparent nausea or vomiting Anesthetic complications: no    Last Vitals:  Vitals:   08/15/18 1300 08/15/18 1330  BP: (!) 122/56 118/67  Pulse: 73 73  Resp: (!) 21 (!) 21  Temp:  (!) 36.4 C  SpO2: 95% 100%    Last Pain:  Vitals:   08/15/18 1330  TempSrc:   PainSc: 4                  Catalina Gravel

## 2018-08-27 ENCOUNTER — Encounter: Payer: Self-pay | Admitting: Internal Medicine

## 2018-09-06 DIAGNOSIS — L218 Other seborrheic dermatitis: Secondary | ICD-10-CM | POA: Diagnosis not present

## 2018-09-06 DIAGNOSIS — L820 Inflamed seborrheic keratosis: Secondary | ICD-10-CM | POA: Diagnosis not present

## 2018-09-06 DIAGNOSIS — L2084 Intrinsic (allergic) eczema: Secondary | ICD-10-CM | POA: Diagnosis not present

## 2018-09-13 ENCOUNTER — Ambulatory Visit (INDEPENDENT_AMBULATORY_CARE_PROVIDER_SITE_OTHER): Payer: Medicare Other | Admitting: Pharmacist

## 2018-09-13 VITALS — Wt 151.0 lb

## 2018-09-13 DIAGNOSIS — I4891 Unspecified atrial fibrillation: Secondary | ICD-10-CM | POA: Diagnosis not present

## 2018-09-13 LAB — CBC
Hematocrit: 30.4 % — ABNORMAL LOW (ref 34.0–46.6)
Hemoglobin: 9.5 g/dL — ABNORMAL LOW (ref 11.1–15.9)
MCH: 26.8 pg (ref 26.6–33.0)
MCHC: 31.3 g/dL — ABNORMAL LOW (ref 31.5–35.7)
MCV: 86 fL (ref 79–97)
Platelets: 390 10*3/uL (ref 150–450)
RBC: 3.55 x10E6/uL — ABNORMAL LOW (ref 3.77–5.28)
RDW: 16 % — ABNORMAL HIGH (ref 12.3–15.4)
WBC: 8.1 10*3/uL (ref 3.4–10.8)

## 2018-09-13 LAB — BASIC METABOLIC PANEL
BUN/Creatinine Ratio: 17 (ref 12–28)
BUN: 15 mg/dL (ref 8–27)
CO2: 24 mmol/L (ref 20–29)
Calcium: 9.1 mg/dL (ref 8.7–10.3)
Chloride: 100 mmol/L (ref 96–106)
Creatinine, Ser: 0.86 mg/dL (ref 0.57–1.00)
GFR calc Af Amer: 82 mL/min/{1.73_m2} (ref >59)
GFR calc non Af Amer: 71 mL/min/{1.73_m2} (ref >59)
Glucose: 87 mg/dL (ref 65–99)
Potassium: 3.6 mmol/L (ref 3.5–5.2)
Sodium: 140 mmol/L (ref 134–144)

## 2018-09-13 MED ORDER — RIVAROXABAN 20 MG PO TABS: 20.0000 mg | ORAL_TABLET | Freq: Every day | ORAL | 1 refills | Status: DC

## 2018-09-13 NOTE — Progress Notes (Signed)
Pt was started on Xarelto 20mg  daily on 03/08/18.  Reviewed patients medication list.  Pt is not currently on any combined P-gp and strong CYP3A4 inhibitors/inducers (ketoconazole, traconazole, ritonavir, carbamazepine, phenytoin, rifampin, St. John's wort).  Reviewed labs.  SCr 0.86, Weight 151 lbs, CrCl 46mL/min.  Dose appropriate based on CrCl.   Hgb and HCT low stable.  A full discussion of the nature of anticoagulants has been carried out.  A benefit/risk analysis has been presented to the patient, so that they understand the justification for choosing anticoagulation with Xarelto at this time.  The need for compliance is stressed.  Pt is aware to take the medication once daily with the largest meal of the day.  Side effects of potential bleeding are discussed, including unusual colored urine or stools, coughing up blood or coffee ground emesis, nose bleeds or serious fall or head trauma.  Discussed signs and symptoms of stroke. The patient should avoid any OTC items containing aspirin or ibuprofen.  Avoid alcohol consumption.   Call if any signs of abnormal bleeding.  Discussed financial obligations and resolved any difficulty in obtaining medication.  Next lab test in 6 months.

## 2018-10-04 ENCOUNTER — Ambulatory Visit (INDEPENDENT_AMBULATORY_CARE_PROVIDER_SITE_OTHER)
Admission: RE | Admit: 2018-10-04 | Discharge: 2018-10-04 | Disposition: A | Discharge status: Home / Self Care | Payer: Medicare Other | Source: Ambulatory Visit | Attending: Family Medicine | Admitting: Family Medicine

## 2018-10-04 ENCOUNTER — Encounter: Payer: Self-pay | Admitting: Family Medicine

## 2018-10-04 ENCOUNTER — Ambulatory Visit (INDEPENDENT_AMBULATORY_CARE_PROVIDER_SITE_OTHER): Payer: Medicare Other | Admitting: Family Medicine

## 2018-10-04 VITALS — BP 152/100 | HR 70 | Resp 16 | Wt 151.0 lb

## 2018-10-04 DIAGNOSIS — M545 Low back pain, unspecified: Secondary | ICD-10-CM

## 2018-10-04 DIAGNOSIS — G8929 Other chronic pain: Secondary | ICD-10-CM

## 2018-10-04 MED ORDER — GABAPENTIN 100 MG PO CAPS: 100.0000 mg | ORAL_CAPSULE | Freq: Three times a day (TID) | ORAL | 1 refills | Status: DC

## 2018-10-04 NOTE — Progress Notes (Signed)
Samantha Stevenson - 67 y.o. female MRN [id number]  Date of birth: Jul 17, 1951  SUBJECTIVE:  Including CC & ROS.  Chief Complaint  Patient presents with  . Initial Assessment    Back pain    Samantha Stevenson is a 67 y.o. female that is presenting with bilateral lower back pain.  Pain is acute on chronic in nature.  She denies any radicular symptoms.  She has not had any improvement with over-the-counter medications.  Denies any inciting event.  Feels like symptoms are staying the same.  Symptoms are mild to moderate.   Review of Systems  Constitutional: Negative for fever.  HENT: Negative for congestion.   Respiratory: Negative for cough.   Cardiovascular: Negative for chest pain.  Gastrointestinal: Negative for abdominal pain.  Musculoskeletal: Positive for back pain.  Skin: Negative for color change.  Neurological: Negative for weakness.  Hematological: Negative for adenopathy.  Psychiatric/Behavioral: Negative for agitation.    HISTORY: Past Medical, Surgical, Social, and Family History Reviewed & Updated per EMR.   Pertinent Historical Findings include:  Past Medical History:  Diagnosis Date  . Anemia   . Arthritis    "qwhere" (07/29/2018)  . Atrial fibrillation ([place])   . Degenerative arthritis of hip    s/p L THR 12/2012  . Dyspnea    since surgery in August 2019- "when I get worked up and walk a short distance"  . Dysrhythmia   . History of blood transfusion 05/2018   "related to OR"  . Hyperlipidemia   . Hypertension   . Migraines    mIgraines- none since blood pressure and lipids are under control  . Myocardial infarction ([place]) 2000   due to atrial fib  . Pulmonary emboli ([place]) 12/2012 dx   post op (L THR), anticoag x 76mo    Past Surgical History:  Procedure Laterality Date  . ABDOMINAL HYSTERECTOMY     "w/1 tube"  . ARTERY REPAIR Left 08/15/2018   Procedure: LEFT BRACHIAL ARTERY EXPLORATION;  Surgeon: Waynetta Sandy, MD;  Location: [facility];   Service: Vascular;  Laterality: Left;  . Breast Ultrasound Left 04/16/13   Done @ breast center Impression: no malignancy appearance noted on the screen study is consistent with a summation shadow  . CARDIAC CATHETERIZATION    . CAROTID-SUBCLAVIAN BYPASS GRAFT Left 06/13/2018   Procedure: BYPASS GRAFT CAROTID-SUBCLAVIAN;  Surgeon: Waynetta Sandy, MD;  Location: [facility];  Service: Vascular;  Laterality: Left;  . DG TUMB RIGHT HAND Left    cyst removal  . IR THORACENTESIS ASP PLEURAL SPACE W/IMG GUIDE  05/31/2018  . THORACIC AORTIC ENDOVASCULAR STENT GRAFT Left 06/13/2018   Procedure: LEFT  SUBCLAVIAN TO CAROTID ARTERY TRANSPOSITION; THORACIC AORTIC ENDOVASCULAR STENT GRAFT using GORE CONFORMABLE THORACIC STENT GRAFT AND GORE TAG THORACIC ENDOPROSTHESIS; STENT LEFT COMMON CAROTID ARTERY, RIGHT COMMON-FEMORAL ENDARTERECTOMY WITH PATCH ANGIOPLASTY;  Surgeon: Waynetta Sandy, MD;  Location: [facility];  Service: Vascular;  Laterality: Left;  . TONSILLECTOMY    . TOTAL HIP ARTHROPLASTY Left 01/03/2013   Procedure: TOTAL HIP ARTHROPLASTY ANTERIOR APPROACH;  Surgeon: Mcarthur Rossetti, MD;  [facility]: [facility];  Service: Orthopedics;  Laterality: Left;  Left Total Hip Arthroplasty, Anterior Approach  . TUBAL LIGATION    . VAGINA RECONSTRUCTION SURGERY  1966   vagina had closed up when 67 years old  . WOUND EXPLORATION Left 06/16/2018   Procedure: LEFT NECK WOUND EXPLORATION, CHEST TUBE INSERTION;  Surgeon: Waynetta Sandy, MD;  Location: [facility];  Service: Vascular;  Laterality: Left;    Allergies  Allergen Reactions  . Penicillins Swelling and Other (See Comments)    Severe swelling PATIENT HAS HAD A PCN REACTION WITH IMMEDIATE RASH, FACIAL/TONGUE/THROAT SWELLING, SOB, OR LIGHTHEADEDNESS WITH HYPOTENSION:  #  #  YES  #  #  Has patient had a PCN reaction causing severe rash involving mucus membranes or skin necrosis: No PATIENT HAS HAD A PCN REACTION THAT REQUIRED HOSPITALIZATION:   #  #  YES  #  #  Has patient had a PCN reaction occurring within the last 10 years: No  . Latex Hives and Rash  . Banana Nausea Only  . Asa [Aspirin] Nausea And Vomiting    Makes stomach upset  . Celebrex [Celecoxib] Nausea And Vomiting    GI upset  . Nsaids Rash    Family History  Problem Relation Age of Onset  . Heart disease Father   . Diabetes Mother   . Heart disease Mother        pacemaker  . Heart attack Mother 4  . Diabetes Sister   . Alcohol abuse Other   . Heart disease Other   . Hyperlipidemia Other   . Hypertension Other   . Diabetes Other   . Breast cancer Other      Social History   Socioeconomic History  . Marital status: Divorced    Spouse name: Not on file  . Number of children: 2  . Years of education: Not on file  . Highest education level: Not on file  Occupational History  . Occupation: retired  Scientific laboratory technician  . Financial resource strain: Not very hard  . Food insecurity:    Worry: Sometimes true    Inability: Sometimes true  . Transportation needs:    Medical: No    Non-medical: No  Tobacco Use  . Smoking status: Never Smoker  . Smokeless tobacco: Never Used  Substance and Sexual Activity  . Alcohol use: Not Currently  . Drug use: Never  . Sexual activity: Not Currently  Lifestyle  . Physical activity:    Days per week: 4 days    Minutes per session: 60 min  . Stress: To some extent  Relationships  . Social connections:    Talks on phone: More than three times a week    Gets together: More than three times a week    Attends religious service: More than 4 times per year    Active member of club or organization: Yes    Attends meetings of clubs or organizations: 1 to 4 times per year    Relationship status: Not on file  . Intimate partner violence:    Fear of current or ex partner: Not on file    Emotionally abused: Not on file    Physically abused: Not on file    Forced sexual activity: Not on file  Other Topics Concern  .  Not on file  Social History Narrative  . Not on file     PHYSICAL EXAM:  VS: BP (!) 152/100   Pulse 70   Resp 16   Wt 151 lb (68.5 kg)   SpO2 98%   BMI 25.92 kg/m  Physical Exam Gen: NAD, alert, cooperative with exam, well-appearing ENT: normal lips, normal nasal mucosa,  Eye: normal EOM, normal conjunctiva and lids CV:  no edema, +2 pedal pulses   Resp: no accessory muscle use, non-labored,  Skin: no rashes, no areas of induration  Neuro: normal tone, normal sensation to  touch Psych:  normal insight, alert and oriented MSK:  Back:  No tenderness palpation of midline spine. Normal internal and external rotation of the hips. Normal strength resistance with hip flexion, knee flexion extension, plantarflexion and dorsiflexion. Negative straight leg raise bilaterally. Neurovascular intact     ASSESSMENT & PLAN:   Chronic bilateral low back pain without sciatica Pain is likely spasm in nature.  Posture likely is also playing component.  She is on Xarelto -Gabapentin. -X-ray today. -Counseled on supportive care and home exercise therapy. -If no improvement can consider trigger point injections or physical therapy.

## 2018-10-04 NOTE — Patient Instructions (Signed)
Good to see you  Please try the exercises  Please try gabapentin. Please start with one pill at night and then increase to two or three pills per day.  Please try heat on the area  Please see me back in 3-4 weeks if no better

## 2018-10-07 ENCOUNTER — Telehealth: Payer: Self-pay | Admitting: Family Medicine

## 2018-10-07 DIAGNOSIS — G8929 Other chronic pain: Secondary | ICD-10-CM | POA: Insufficient documentation

## 2018-10-07 DIAGNOSIS — M545 Low back pain: Principal | ICD-10-CM

## 2018-10-07 NOTE — Telephone Encounter (Signed)
Left VM for patient. If she calls back please have her speak with a nurse/CMA and inform that she has mild degenerative changes of the lower back. The [facility] can report results to patient.   If any questions then please take the best time and phone number to call and I will try to call her back.   Rosemarie Ax, MD [facility] 10/07/2018, 12:33 PM

## 2018-10-07 NOTE — Assessment & Plan Note (Signed)
Pain is likely spasm in nature.  Posture likely is also playing component.  She is on Xarelto -Gabapentin. -X-ray today. -Counseled on supportive care and home exercise therapy. -If no improvement can consider trigger point injections or physical therapy.

## 2018-10-08 DIAGNOSIS — G5602 Carpal tunnel syndrome, left upper limb: Secondary | ICD-10-CM | POA: Diagnosis not present

## 2018-10-08 DIAGNOSIS — R2232 Localized swelling, mass and lump, left upper limb: Secondary | ICD-10-CM | POA: Diagnosis not present

## 2018-10-08 NOTE — Telephone Encounter (Signed)
Pt. Given results and verbalizes understanding.

## 2018-10-09 ENCOUNTER — Emergency Department (HOSPITAL_COMMUNITY)
Admission: EM | Admit: 2018-10-09 | Discharge: 2018-10-09 | Disposition: A | Discharge status: Home / Self Care | Payer: Medicare Other | Attending: Emergency Medicine | Admitting: Emergency Medicine

## 2018-10-09 ENCOUNTER — Encounter (HOSPITAL_COMMUNITY): Payer: Self-pay | Admitting: Pharmacy Technician

## 2018-10-09 ENCOUNTER — Other Ambulatory Visit: Payer: Self-pay

## 2018-10-09 ENCOUNTER — Telehealth: Payer: Self-pay

## 2018-10-09 ENCOUNTER — Emergency Department (HOSPITAL_COMMUNITY): Payer: Medicare Other

## 2018-10-09 ENCOUNTER — Ambulatory Visit (HOSPITAL_BASED_OUTPATIENT_CLINIC_OR_DEPARTMENT_OTHER)
Admit: 2018-10-09 | Discharge: 2018-10-09 | Disposition: A | Discharge status: Home / Self Care | Payer: Medicare Other | Attending: Emergency Medicine | Admitting: Emergency Medicine

## 2018-10-09 DIAGNOSIS — Z86711 Personal history of pulmonary embolism: Secondary | ICD-10-CM | POA: Insufficient documentation

## 2018-10-09 DIAGNOSIS — R52 Pain, unspecified: Secondary | ICD-10-CM

## 2018-10-09 DIAGNOSIS — I4891 Unspecified atrial fibrillation: Secondary | ICD-10-CM | POA: Insufficient documentation

## 2018-10-09 DIAGNOSIS — R1031 Right lower quadrant pain: Secondary | ICD-10-CM | POA: Insufficient documentation

## 2018-10-09 DIAGNOSIS — M79604 Pain in right leg: Secondary | ICD-10-CM

## 2018-10-09 DIAGNOSIS — I10 Essential (primary) hypertension: Secondary | ICD-10-CM | POA: Diagnosis not present

## 2018-10-09 DIAGNOSIS — J9811 Atelectasis: Secondary | ICD-10-CM | POA: Diagnosis not present

## 2018-10-09 DIAGNOSIS — R0789 Other chest pain: Secondary | ICD-10-CM | POA: Diagnosis not present

## 2018-10-09 DIAGNOSIS — R0602 Shortness of breath: Secondary | ICD-10-CM | POA: Insufficient documentation

## 2018-10-09 LAB — PROTIME-INR
INR: 1.84
Prothrombin Time: 21 seconds — ABNORMAL HIGH (ref 11.4–15.2)

## 2018-10-09 LAB — I-STAT TROPONIN, ED
Troponin i, poc: 0 ng/mL (ref 0.00–0.08)
Troponin i, poc: 0 ng/mL (ref 0.00–0.08)

## 2018-10-09 LAB — COMPREHENSIVE METABOLIC PANEL
ALT: 14 U/L (ref 0–44)
AST: 19 U/L (ref 15–41)
Albumin: 3.1 g/dL — ABNORMAL LOW (ref 3.5–5.0)
Alkaline Phosphatase: 84 U/L (ref 38–126)
Anion gap: 10 (ref 5–15)
BUN: 12 mg/dL (ref 8–23)
CO2: 26 mmol/L (ref 22–32)
Calcium: 8.9 mg/dL (ref 8.9–10.3)
Chloride: 103 mmol/L (ref 98–111)
Creatinine, Ser: 0.79 mg/dL (ref 0.44–1.00)
GFR calc Af Amer: >60 mL/min (ref >60)
GFR calc non Af Amer: >60 mL/min (ref >60)
Glucose, Bld: 104 mg/dL — ABNORMAL HIGH (ref 70–99)
Potassium: 3.7 mmol/L (ref 3.5–5.1)
Sodium: 139 mmol/L (ref 135–145)
Total Bilirubin: 0.8 mg/dL (ref 0.3–1.2)
Total Protein: 6.8 g/dL (ref 6.5–8.1)

## 2018-10-09 LAB — CBC
HCT: 33.8 % — ABNORMAL LOW (ref 36.0–46.0)
Hemoglobin: 10.1 g/dL — ABNORMAL LOW (ref 12.0–15.0)
MCH: 26.6 pg (ref 26.0–34.0)
MCHC: 29.9 g/dL — ABNORMAL LOW (ref 30.0–36.0)
MCV: 88.9 fL (ref 80.0–100.0)
Platelets: 325 10*3/uL (ref 150–400)
RBC: 3.8 MIL/uL — ABNORMAL LOW (ref 3.87–5.11)
RDW: 16.3 % — ABNORMAL HIGH (ref 11.5–15.5)
WBC: 7.3 10*3/uL (ref 4.0–10.5)
nRBC: 0 % (ref 0.0–0.2)

## 2018-10-09 LAB — BRAIN NATRIURETIC PEPTIDE: B Natriuretic Peptide: 21.6 pg/mL (ref 0.0–100.0)

## 2018-10-09 LAB — I-STAT CG4 LACTIC ACID, ED: Lactic Acid, Venous: 0.76 mmol/L (ref 0.5–1.9)

## 2018-10-09 LAB — APTT: aPTT: 44 seconds — ABNORMAL HIGH (ref 24–36)

## 2018-10-09 MED ORDER — PREDNISONE 20 MG PO TABS: 40.0000 mg | ORAL_TABLET | Freq: Every day | ORAL | 0 refills | Status: AC

## 2018-10-09 MED ORDER — PREDNISONE 20 MG PO TABS
60.0000 mg | ORAL_TABLET | Freq: Once | ORAL | Status: AC
  Administered 2018-10-09: 60 mg via ORAL
  Filled 2018-10-09: qty 3

## 2018-10-09 MED ORDER — IOHEXOL 350 MG/ML SOLN
100.0000 mL | Freq: Once | INTRAVENOUS | Status: AC | PRN
  Administered 2018-10-09: 100 mL via INTRAVENOUS

## 2018-10-09 MED ORDER — IOPAMIDOL (ISOVUE-370) INJECTION 76%
INTRAVENOUS | Status: AC
  Filled 2018-10-09: qty 100

## 2018-10-09 MED ORDER — HYDROMORPHONE HCL 1 MG/ML IJ SOLN
0.5000 mg | Freq: Once | INTRAMUSCULAR | Status: AC
  Administered 2018-10-09: 0.5 mg via INTRAVENOUS
  Filled 2018-10-09: qty 1

## 2018-10-09 MED ORDER — SODIUM CHLORIDE 0.9 % IV BOLUS
500.0000 mL | Freq: Once | INTRAVENOUS | Status: AC
  Administered 2018-10-09: 500 mL via INTRAVENOUS

## 2018-10-09 MED ORDER — CYCLOBENZAPRINE HCL 10 MG PO TABS: 10.0000 mg | ORAL_TABLET | Freq: Two times a day (BID) | ORAL | 0 refills | Status: DC | PRN

## 2018-10-09 MED ORDER — METHOCARBAMOL 500 MG PO TABS
1000.0000 mg | ORAL_TABLET | Freq: Once | ORAL | Status: AC
  Administered 2018-10-09: 1000 mg via ORAL
  Filled 2018-10-09: qty 2

## 2018-10-09 NOTE — ED Notes (Signed)
Patient transported to CT 

## 2018-10-09 NOTE — ED Notes (Signed)
Pt transported to vascular.  °

## 2018-10-09 NOTE — ED Provider Notes (Signed)
[facility] Emergency Department Provider Note MRN:  [id number]  Arrival date & time: 10/09/18     Chief Complaint   Leg Pain and Shortness of Breath   History of Present Illness   Samantha Stevenson is a 67 y.o. year-old female with a history of A. fib, pulmonary realism, type B dissection presenting to the ED with chief complaint of leg pain, shortness of breath.  4 to 5 days of constant right leg pain and swelling.  Yesterday began experiencing shortness of breath and right flank pain.  Pain is worse with deep breathing.  Pain is severe, constant, described as sharp.  Associated with shortness of breath.  Denies fever, no headache or vision change, the flank pain does radiate to the right side of the chest.  Denies abdominal pain, no dysuria.  Review of Systems  A complete 10 system review of systems was obtained and all systems are negative except as noted in the HPI and PMH.   Patient's Health History    Past Medical History:  Diagnosis Date  . Anemia   . Arthritis    "qwhere" (07/29/2018)  . Atrial fibrillation ([place])   . Degenerative arthritis of hip    s/p L THR 12/2012  . Dyspnea    since surgery in August 2019- "when I get worked up and walk a short distance"  . Dysrhythmia   . History of blood transfusion 05/2018   "related to OR"  . Hyperlipidemia   . Hypertension   . Migraines    mIgraines- none since blood pressure and lipids are under control  . Myocardial infarction ([place]) 2000   due to atrial fib  . Pulmonary emboli ([place]) 12/2012 dx   post op (L THR), anticoag x 36mo    Past Surgical History:  Procedure Laterality Date  . ABDOMINAL HYSTERECTOMY     "w/1 tube"  . ARTERY REPAIR Left 08/15/2018   Procedure: LEFT BRACHIAL ARTERY EXPLORATION;  Surgeon: Waynetta Sandy, MD;  Location: [place];  Service: Vascular;  Laterality: Left;  . Breast Ultrasound Left 04/16/13   Done @ breast center Impression: no malignancy appearance noted on the  screen study is consistent with a summation shadow  . CARDIAC CATHETERIZATION    . CAROTID-SUBCLAVIAN BYPASS GRAFT Left 06/13/2018   Procedure: BYPASS GRAFT CAROTID-SUBCLAVIAN;  Surgeon: Waynetta Sandy, MD;  Location: [facility];  Service: Vascular;  Laterality: Left;  . DG TUMB RIGHT HAND Left    cyst removal  . IR THORACENTESIS ASP PLEURAL SPACE W/IMG GUIDE  05/31/2018  . THORACIC AORTIC ENDOVASCULAR STENT GRAFT Left 06/13/2018   Procedure: LEFT  SUBCLAVIAN TO CAROTID ARTERY TRANSPOSITION; THORACIC AORTIC ENDOVASCULAR STENT GRAFT using GORE CONFORMABLE THORACIC STENT GRAFT AND GORE TAG THORACIC ENDOPROSTHESIS; STENT LEFT COMMON CAROTID ARTERY, RIGHT COMMON-FEMORAL ENDARTERECTOMY WITH PATCH ANGIOPLASTY;  Surgeon: Waynetta Sandy, MD;  Location: [facility];  Service: Vascular;  Laterality: Left;  . TONSILLECTOMY    . TOTAL HIP ARTHROPLASTY Left 01/03/2013   Procedure: TOTAL HIP ARTHROPLASTY ANTERIOR APPROACH;  Surgeon: Mcarthur Rossetti, MD;  [facility];  Service: Orthopedics;  Laterality: Left;  Left Total Hip Arthroplasty, Anterior Approach  . TUBAL LIGATION    . VAGINA RECONSTRUCTION SURGERY  1966   vagina had closed up when 67 years old  . WOUND EXPLORATION Left 06/16/2018   Procedure: LEFT NECK WOUND EXPLORATION, CHEST TUBE INSERTION;  Surgeon: Waynetta Sandy, MD;  Location: [facility];  Service: Vascular;  Laterality: Left;  Family History  Problem Relation Age of Onset  . Heart disease Father   . Diabetes Mother   . Heart disease Mother        pacemaker  . Heart attack Mother 18  . Diabetes Sister   . Alcohol abuse Other   . Heart disease Other   . Hyperlipidemia Other   . Hypertension Other   . Diabetes Other   . Breast cancer Other     Social History   Socioeconomic History  . Marital status: Divorced    Spouse name: Not on file  . Number of children: 2  . Years of education: Not on file  . Highest education level: Not on file  Occupational  History  . Occupation: retired  Scientific laboratory technician  . Financial resource strain: Not very hard  . Food insecurity:    Worry: Sometimes true    Inability: Sometimes true  . Transportation needs:    Medical: No    Non-medical: No  Tobacco Use  . Smoking status: Never Smoker  . Smokeless tobacco: Never Used  Substance and Sexual Activity  . Alcohol use: Not Currently  . Drug use: Never  . Sexual activity: Not Currently  Lifestyle  . Physical activity:    Days per week: 4 days    Minutes per session: 60 min  . Stress: To some extent  Relationships  . Social connections:    Talks on phone: More than three times a week    Gets together: More than three times a week    Attends religious service: More than 4 times per year    Active member of club or organization: Yes    Attends meetings of clubs or organizations: 1 to 4 times per year    Relationship status: Not on file  . Intimate partner violence:    Fear of current or ex partner: Not on file    Emotionally abused: Not on file    Physically abused: Not on file    Forced sexual activity: Not on file  Other Topics Concern  . Not on file  Social History Narrative  . Not on file     Physical Exam  Vital Signs and Nursing Notes reviewed Vitals:   10/09/18 1515 10/09/18 1530  BP: 140/75 (!) 141/82  Pulse: 80 77  Resp: 17 17  Temp:    SpO2: 97% 96%    CONSTITUTIONAL: Well-appearing, in mild to moderate distress due to pain NEURO:  Alert and oriented x 3, no focal deficits EYES:  eyes equal and reactive ENT/NECK:  no LAD, no JVD CARDIO: Regular rate, well-perfused, normal S1 and S2 PULM:  CTAB no wheezing or rhonchi GI/GU:  normal bowel sounds, non-distended, non-tender MSK/SPINE:  No gross deformities, mild edema with tenderness to palpation to the right leg SKIN:  no rash, atraumatic PSYCH:  Appropriate speech and behavior  Diagnostic and Interventional Summary    EKG Interpretation  Date/Time:  Wednesday October 09 2018 11:31:18 EST Ventricular Rate:  76 PR Interval:    QRS Duration: 80 QT Interval:  373 QTC Calculation: 420 R Axis:   -56 Text Interpretation:  Sinus rhythm Probable left atrial enlargement Left anterior fascicular block Consider anterior infarct ST elevation, consider inferior injury Baseline wander in lead(s) II III aVR aVL aVF V1 V2 V3 Confirmed by Gerlene Fee [phone]) on 10/09/2018 1:26:45 PM      Labs Reviewed  CBC - Abnormal; Notable for the following components:      Result  Value   RBC 3.80 (*)    Hemoglobin 10.1 (*)    HCT 33.8 (*)    MCHC 29.9 (*)    RDW 16.3 (*)    All other components within normal limits  COMPREHENSIVE METABOLIC PANEL - Abnormal; Notable for the following components:   Glucose, Bld 104 (*)    Albumin 3.1 (*)    All other components within normal limits  PROTIME-INR - Abnormal; Notable for the following components:   Prothrombin Time 21.0 (*)    All other components within normal limits  APTT - Abnormal; Notable for the following components:   aPTT 44 (*)    All other components within normal limits  BRAIN NATRIURETIC PEPTIDE  I-STAT TROPONIN, ED  I-STAT CG4 LACTIC ACID, ED  I-STAT TROPONIN, ED    CT Angio Chest PE W and/or Wo Contrast  Final Result    VAS [place] LOWER EXTREMITY VENOUS (DVT) (ONLY [facility])      DG Chest Port 1 View  Final Result      Medications  iopamidol (ISOVUE-370) 76 % injection (has no administration in time range)  HYDROmorphone (DILAUDID) injection 0.5 mg (0.5 mg Intravenous Given 10/09/18 1152)  sodium chloride 0.9 % bolus 500 mL (0 mLs Intravenous Stopped 10/09/18 1252)  iohexol (OMNIPAQUE) 350 MG/ML injection 100 mL (100 mLs Intravenous Contrast Given 10/09/18 1247)  predniSONE (DELTASONE) tablet 60 mg (60 mg Oral Given 10/09/18 1441)  methocarbamol (ROBAXIN) tablet 1,000 mg (1,000 mg Oral Given 10/09/18 1441)     Procedures Critical Care  ED Course and Medical Decision Making  I have reviewed the  triage vital signs and the nursing notes.  Pertinent labs & imaging results that were available during my care of the patient were reviewed by me and considered in my medical decision making (see below for details).  Concern for pulmonary embolism versus complication of recent type B dissection stent repair in this 67 year old female.  Vital signs stable, no neurological deficits, will need CT imaging of both pulmonary artery as well as aorta.  CTA imaging reveals no acute process, no complications of the aorta, no PE.  Troponin negative x2.  Upon reassessment and reevaluation, patient explains that her pain is mostly surrounding her right hip, right gluteal muscle, radiating down her leg.  Normal range of motion of the hip, nothing to suspect septic joint.  Favoring sciatica versus strain or sprain of the hamstring.  Prescription for prednisone, Flexeril, close PCP follow-up.  After the discussed management above, the patient was determined to be safe for discharge.  The patient was in agreement with this plan and all questions regarding their care were answered.  ED return precautions were discussed and the patient will return to the ED with any significant worsening of condition.  Barth Kirks. Sedonia Small, MD [facility] [email or web address]  Final Clinical Impressions(s) / ED Diagnoses     ICD-10-CM   1. Right leg pain M79.604   2. SOB (shortness of breath) R06.02 DG Chest [facility] 1 View    DG Chest Port 1 View    ED Discharge Orders         Ordered    predniSONE (DELTASONE) 20 MG tablet  Daily     10/09/18 1548    cyclobenzaprine (FLEXERIL) 10 MG tablet  2 times daily PRN     10/09/18 1548             Maudie Flakes, MD 10/09/18 1630

## 2018-10-09 NOTE — ED Triage Notes (Signed)
Pt arrives via POV with leg pain onset Friday with gradual worsening with sob onset yestereday. Pt took a percocet this morning with minimal relief. Pt with hx blood clots, on xarelto.

## 2018-10-09 NOTE — Progress Notes (Signed)
Right lower extremity venous duplex has been completed. Negative for DVT. Results were given to Dr. Sedonia Small.  10/09/18 12:30 PM Samantha Stevenson RVT

## 2018-10-09 NOTE — ED Notes (Signed)
Patient verbalizes understanding of discharge instructions. Opportunity for questioning and answers were provided. 

## 2018-10-09 NOTE — ED Notes (Signed)
Portably xray at the bedside

## 2018-10-09 NOTE — Telephone Encounter (Signed)
Called pt in regards to a mychart message that was left on yesterday. Pt stated that she has been having extreme pain in her L LE from her buttock down to her foot. She says it has been hard to sit, lie or stand since yesterday. Pt also reported SOB since yesterday and while we were on the phone I heard her gasping for air on two occasions during our 10 minute conversation. At that time I advised the pt to call 911 and not do any activity until an ambulance arrived. She stated she would call her daughter to come take her to [facility]. Although I advised her to call 911 she wanted to call her daughter instead. Called pt back 2-3 minutes later to ensure her daughter was on the way and she stated she should be there within a few minutes.

## 2018-10-09 NOTE — Discharge Instructions (Addendum)
You were evaluated in the Emergency Department and after careful evaluation, we did not find any emergent condition requiring admission or further testing in the hospital.  Your symptoms today seem to be due to muscle strain or spasm, possibly sciatica.  Please take the medications provided as directed and follow-up with your regular doctor.  Please return to the Emergency Department if you experience any worsening of your condition.  We encourage you to follow up with a primary care provider.  Thank you for allowing [place] to be a part of your care.

## 2018-10-14 DIAGNOSIS — G5602 Carpal tunnel syndrome, left upper limb: Secondary | ICD-10-CM | POA: Diagnosis not present

## 2018-10-26 ENCOUNTER — Other Ambulatory Visit: Payer: Self-pay | Admitting: Internal Medicine

## 2018-11-07 ENCOUNTER — Other Ambulatory Visit: Payer: Medicare Other

## 2018-11-14 ENCOUNTER — Ambulatory Visit
Admission: RE | Admit: 2018-11-14 | Discharge: 2018-11-14 | Disposition: A | Discharge status: Home / Self Care | Payer: Medicare Other | Source: Ambulatory Visit | Attending: Vascular Surgery | Admitting: Vascular Surgery

## 2018-11-14 DIAGNOSIS — I7100 Dissection of unspecified site of aorta: Secondary | ICD-10-CM

## 2018-11-14 DIAGNOSIS — I771 Stricture of artery: Secondary | ICD-10-CM | POA: Diagnosis not present

## 2018-11-14 DIAGNOSIS — K5730 Diverticulosis of large intestine without perforation or abscess without bleeding: Secondary | ICD-10-CM | POA: Diagnosis not present

## 2018-11-14 MED ORDER — IOPAMIDOL (ISOVUE-370) INJECTION 76%
75.0000 mL | Freq: Once | INTRAVENOUS | Status: AC | PRN
  Administered 2018-11-14: 75 mL via INTRAVENOUS

## 2018-11-15 ENCOUNTER — Ambulatory Visit (INDEPENDENT_AMBULATORY_CARE_PROVIDER_SITE_OTHER): Payer: Medicare Other | Admitting: Vascular Surgery

## 2018-11-15 ENCOUNTER — Ambulatory Visit: Payer: Medicare Other | Admitting: Vascular Surgery

## 2018-11-15 ENCOUNTER — Other Ambulatory Visit: Payer: Self-pay

## 2018-11-15 ENCOUNTER — Encounter: Payer: Self-pay | Admitting: Vascular Surgery

## 2018-11-15 VITALS — BP 142/85 | HR 78 | Resp 18 | Ht 64.0 in | Wt 149.0 lb

## 2018-11-15 DIAGNOSIS — I7100 Dissection of unspecified site of aorta: Secondary | ICD-10-CM | POA: Diagnosis not present

## 2018-11-15 NOTE — Progress Notes (Signed)
Patient ID: Samantha Stevenson, female   DOB: 1951-08-08, 68 y.o.   MRN: [id number]  Reason for Consult: Follow-up (3 month f'/u )   Referred by Hoyt Koch, *  Subjective:     HPI:  Samantha Stevenson is a 68 y.o. female follows up after left subclavian artery transposition stenting of her left common carotid artery and thoracic endograft doing for acute aortic intramural hematoma.  She subsequently had a lymphatic leak that resolved after drainage and was taken back to the operating room for angiogram to evaluate for possible pseudoaneurysm.  Nothing could be found at this time.  That was done last October.  She states that now she is doing very well.  All of her wounds of healed.  She feels like she is back to her normal level of health.  She is taking Xarelto for atrial fibrillation.  She is also taking a statin.  She has no complaints related to today's visit.  Past Medical History:  Diagnosis Date  . Anemia   . Arthritis    "qwhere" (07/29/2018)  . Atrial fibrillation ([place])   . Degenerative arthritis of hip    s/p L THR 12/2012  . Dyspnea    since surgery in August 2019- "when I get worked up and walk a short distance"  . Dysrhythmia   . History of blood transfusion 05/2018   "related to OR"  . Hyperlipidemia   . Hypertension   . Migraines    mIgraines- none since blood pressure and lipids are under control  . Myocardial infarction ([place]) 2000   due to atrial fib  . Pulmonary emboli ([place]) 12/2012 dx   post op (L THR), anticoag x 19mo   Family History  Problem Relation Age of Onset  . Heart disease Father   . Diabetes Mother   . Heart disease Mother        pacemaker  . Heart attack Mother 53  . Diabetes Sister   . Alcohol abuse Other   . Heart disease Other   . Hyperlipidemia Other   . Hypertension Other   . Diabetes Other   . Breast cancer Other    Past Surgical History:  Procedure Laterality Date  . ABDOMINAL HYSTERECTOMY     "w/1 tube"  . ARTERY  REPAIR Left 08/15/2018   Procedure: LEFT BRACHIAL ARTERY EXPLORATION;  Surgeon: Waynetta Sandy, MD;  Location: [facility];  Service: Vascular;  Laterality: Left;  . Breast Ultrasound Left 04/16/13   Done @ breast center Impression: no malignancy appearance noted on the screen study is consistent with a summation shadow  . CARDIAC CATHETERIZATION    . CAROTID-SUBCLAVIAN BYPASS GRAFT Left 06/13/2018   Procedure: BYPASS GRAFT CAROTID-SUBCLAVIAN;  Surgeon: Waynetta Sandy, MD;  Location: [facility];  Service: Vascular;  Laterality: Left;  . DG TUMB RIGHT HAND Left    cyst removal  . IR THORACENTESIS ASP PLEURAL SPACE W/IMG GUIDE  05/31/2018  . THORACIC AORTIC ENDOVASCULAR STENT GRAFT Left 06/13/2018   Procedure: LEFT  SUBCLAVIAN TO CAROTID ARTERY TRANSPOSITION; THORACIC AORTIC ENDOVASCULAR STENT GRAFT using GORE CONFORMABLE THORACIC STENT GRAFT AND GORE TAG THORACIC ENDOPROSTHESIS; STENT LEFT COMMON CAROTID ARTERY, RIGHT COMMON-FEMORAL ENDARTERECTOMY WITH PATCH ANGIOPLASTY;  Surgeon: Waynetta Sandy, MD;  Location: [facility];  Service: Vascular;  Laterality: Left;  . TONSILLECTOMY    . TOTAL HIP ARTHROPLASTY Left 01/03/2013   Procedure: TOTAL HIP ARTHROPLASTY ANTERIOR APPROACH;  Surgeon: Mcarthur Rossetti, MD;  Location: [facility];  Service: Orthopedics;  Laterality: Left;  Left Total Hip Arthroplasty, Anterior Approach  . TUBAL LIGATION    . VAGINA RECONSTRUCTION SURGERY  1966   vagina had closed up when 68 years old  . WOUND EXPLORATION Left 06/16/2018   Procedure: LEFT NECK WOUND EXPLORATION, CHEST TUBE INSERTION;  Surgeon: Waynetta Sandy, MD;  Location: [facility];  Service: Vascular;  Laterality: Left;    Short Social History:  Social History   Tobacco Use  . Smoking status: Never Smoker  . Smokeless tobacco: Never Used  Substance Use Topics  . Alcohol use: Not Currently    Allergies  Allergen Reactions  . Penicillins Swelling and Other (See Comments)    Severe  swelling PATIENT HAS HAD A PCN REACTION WITH IMMEDIATE RASH, FACIAL/TONGUE/THROAT SWELLING, SOB, OR LIGHTHEADEDNESS WITH HYPOTENSION:  #  #  YES  #  #  Has patient had a PCN reaction causing severe rash involving mucus membranes or skin necrosis: No PATIENT HAS HAD A PCN REACTION THAT REQUIRED HOSPITALIZATION:  #  #  YES  #  #  Has patient had a PCN reaction occurring within the last 10 years: No  . Latex Hives and Rash  . Banana Nausea Only  . Asa [Aspirin] Nausea And Vomiting    Makes stomach upset  . Celebrex [Celecoxib] Nausea And Vomiting    GI upset  . Nsaids Rash    Current Outpatient Medications  Medication Sig Dispense Refill  . acetaminophen (TYLENOL) 500 MG tablet Take 1,000 mg by mouth as needed for moderate pain.     . cyclobenzaprine (FLEXERIL) 10 MG tablet Take 1 tablet (10 mg total) by mouth 2 (two) times daily as needed for muscle spasms. 20 tablet 0  . DIGOX 250 MCG tablet TAKE 1 TABLET BY MOUTH EVERY MORNING 90 tablet 0  . DILT-XR 240 MG 24 hr capsule TAKE 1 CAPSULE BY MOUTH EVERY MORNING (Patient taking differently: Take 240 mg by mouth daily. ) 90 capsule 3  . ferrous sulfate 325 (65 FE) MG tablet Take 325 mg by mouth once a week.     . fluticasone (FLONASE) 50 MCG/ACT nasal spray Place 2 sprays into both nostrils daily. (Patient taking differently: Place 2 sprays into both nostrils daily as needed for allergies or rhinitis. ) 16 g 6  . furosemide (LASIX) 20 MG tablet Take 20 mg by mouth daily as needed for fluid.     Marland Kitchen gabapentin (NEURONTIN) 300 MG capsule Take 300 mg by mouth 2 (two) times daily.    . Multiple Vitamin (MULTIVITAMIN) tablet Take 1 tablet by mouth daily.    Marland Kitchen oxyCODONE (OXY IR/ROXICODONE) 5 MG immediate release tablet Take 1 tablet (5 mg total) by mouth every 6 (six) hours as needed for moderate pain or breakthrough pain. 30 tablet 0  . oxyCODONE-acetaminophen (PERCOCET) 7.5-325 MG tablet Take 1 tablet by mouth every 6 (six) hours as needed for severe  pain. 30 tablet 0  . rivaroxaban (XARELTO) 20 MG TABS tablet Take 1 tablet (20 mg total) by mouth daily with supper. 90 tablet 1  . rosuvastatin (CRESTOR) 20 MG tablet TAKE 1 TABLET BY MOUTH EVERY DAY 90 tablet 0  . gabapentin (NEURONTIN) 100 MG capsule Take 1 capsule (100 mg total) by mouth 3 (three) times daily. (Patient not taking: Reported on 10/09/2018) 90 capsule 1   No current facility-administered medications for this visit.     Review of Systems  Constitutional:  Constitutional negative. HENT: HENT negative.  Eyes: Eyes negative.  Respiratory:  Respiratory negative.  Cardiovascular: Cardiovascular negative.  GI: Gastrointestinal negative.  Musculoskeletal: Musculoskeletal negative.  Skin: Skin negative.  Neurological: Neurological negative. Hematologic: Hematologic/lymphatic negative.  Psychiatric: Psychiatric negative.        Objective:  Objective   Vitals:   11/15/18 0841  BP: (!) 142/85  Pulse: 78  Resp: 18  SpO2: 98%  Weight: 149 lb (67.6 kg)  Height: 5\' 4"  (1.626 m)   Body mass index is 25.58 kg/m.  Physical Exam HENT:     Head: Normocephalic.  Eyes:     Pupils: Pupils are equal, round, and reactive to light.  Neck:     Musculoskeletal: Normal range of motion and neck supple. No muscular tenderness.     Comments: Well-healed left neck incision without swelling Cardiovascular:     Rate and Rhythm: Normal rate.     Pulses:          Radial pulses are 2+ on the right side and 2+ on the left side.  Abdominal:     General: Abdomen is flat.     Palpations: Abdomen is soft.  Musculoskeletal: Normal range of motion.        General: No swelling.  Skin:    General: Skin is warm and dry.  Neurological:     General: No focal deficit present.     Mental Status: She is alert.  Psychiatric:        Mood and Affect: Mood normal.        Behavior: Behavior normal.        Thought Content: Thought content normal.        Judgment: Judgment normal.      Data: IMPRESSION: Redemonstration of endovascular repair of type B dissection/intramural hematoma, with no complicating features and further positive aortic remodeling.  Patent left common carotid artery stent, which does cover the anastomosis of left subclavian artery transposition, however, perfusion is maintained in both the carotid system and left subclavian system.  Persisting ill-defined density in the left supraclavicular region is present on both pre and postcontrast phases, and thus confirms most compatible with postoperative material/changes.  Aortic atherosclerosis with left main and 2 vessel coronary artery disease. Aortic Atherosclerosis (ICD10-I70.0).  Colonic diverticular disease without evidence of acute diverticulitis.  Signed,  Dulcy Fanny. Earleen Newport, DO, RPVI  We reviewed the CT scan together demonstrating remodeling of the aorta with patent common carotid stents and patent transposed subclavian artery.  What was previously thought to be a pseudoaneurysm was demonstrated prior to contrast being administered most suggestive of postoperative changes.       Assessment/Plan:     68 year old female follows up with CT angios to evaluate for previous above-noted procedures.  She is doing very well at this time.  She will need follow-up in 6 months repeat CT scan at which time we can go out to 1 year for following.  We discussed the postoperative changes in her left supraclavicular area previously thought to be pseudoaneurysm that could not be identified with angiogram and are present on CT scan today prior to contrast administration both suggestive of postsurgical change or left behind surgical material that would likely be thrombogenic in nature.  Given that I cannot palpate anything she is not having issues I would not further explore this.  She demonstrates good understanding she will see [place] in 6 months should she not have issues prior.     Waynetta Sandy MD [facility]

## 2018-12-31 ENCOUNTER — Encounter: Payer: Self-pay | Admitting: *Deleted

## 2019-01-25 ENCOUNTER — Other Ambulatory Visit: Payer: Self-pay | Admitting: Internal Medicine

## 2019-01-28 DIAGNOSIS — Z0279 Encounter for issue of other medical certificate: Secondary | ICD-10-CM

## 2019-03-15 ENCOUNTER — Other Ambulatory Visit: Payer: Self-pay | Admitting: Physician Assistant

## 2019-04-03 ENCOUNTER — Other Ambulatory Visit: Payer: Self-pay | Admitting: Vascular Surgery

## 2019-04-03 DIAGNOSIS — I7100 Dissection of unspecified site of aorta: Secondary | ICD-10-CM

## 2019-04-03 DIAGNOSIS — M79603 Pain in arm, unspecified: Secondary | ICD-10-CM

## 2019-04-08 ENCOUNTER — Ambulatory Visit (INDEPENDENT_AMBULATORY_CARE_PROVIDER_SITE_OTHER): Payer: Medicare Other | Admitting: *Deleted

## 2019-04-08 VITALS — BP 115/62 | HR 58 | Resp 17 | Ht 64.0 in | Wt 158.0 lb

## 2019-04-08 DIAGNOSIS — Z23 Encounter for immunization: Secondary | ICD-10-CM

## 2019-04-08 DIAGNOSIS — Z0000 Encounter for general adult medical examination without abnormal findings: Secondary | ICD-10-CM

## 2019-04-08 NOTE — Progress Notes (Signed)
Subjective:   Samantha Stevenson is a 68 y.o. female who presents for Medicare Annual (Subsequent) preventive examination.  Review of Systems:   Cardiac Risk Factors include: advanced age (>73men, >45 women);dyslipidemia;hypertension Sleep patterns: feels rested on waking, gets up 0-1 times nightly to void and sleeps 7-8 hours nightly.    Home Safety/Smoke Alarms: Feels safe in home. Smoke alarms in place.  Living environment; residence and Firearm Safety: 2-story house. Lives alone, no needs for DME, good support system Seat Belt Safety/Bike Helmet: Wears seat belt.      Objective:     Vitals: BP 115/62   Pulse (!) 58   Resp 17   Ht 5\' 4"  (1.626 m)   Wt 158 lb (71.7 kg)   SpO2 99%   BMI 27.12 kg/m   Body mass index is 27.12 kg/m.  Advanced Directives 04/08/2019 11/15/2018 10/09/2018 08/13/2018 07/29/2018 07/29/2018 07/12/2018  Does Patient Have a Medical Advance Directive? No No No No No No No  Would patient like information on creating a medical advance directive? No - Patient declined No - Patient declined No - Patient declined No - Patient declined No - Patient declined - -  Pre-existing out of facility DNR order (yellow form or pink MOST form) - - - - - - -    Tobacco Social History   Tobacco Use  Smoking Status Never Smoker  Smokeless Tobacco Never Used     Counseling given: Not Answered  Past Medical History:  Diagnosis Date  . Anemia   . Arthritis    "qwhere" (07/29/2018)  . Atrial fibrillation ([place])   . Degenerative arthritis of hip    s/p L THR 12/2012  . Dyspnea    since surgery in August 2019- "when I get worked up and walk a short distance"  . Dysrhythmia   . History of blood transfusion 05/2018   "related to OR"  . Hyperlipidemia   . Hypertension   . Migraines    mIgraines- none since blood pressure and lipids are under control  . Myocardial infarction ([place]) 2000   due to atrial fib  . Pulmonary emboli ([place]) 12/2012 dx   post op (L THR), anticoag  x 71mo   Past Surgical History:  Procedure Laterality Date  . ABDOMINAL HYSTERECTOMY     "w/1 tube"  . ARTERY REPAIR Left 08/15/2018   Procedure: LEFT BRACHIAL ARTERY EXPLORATION;  Surgeon: Waynetta Sandy, MD;  Location: [facility];  Service: Vascular;  Laterality: Left;  . Breast Ultrasound Left 04/16/13   Done @ breast center Impression: no malignancy appearance noted on the screen study is consistent with a summation shadow  . CARDIAC CATHETERIZATION    . CAROTID-SUBCLAVIAN BYPASS GRAFT Left 06/13/2018   Procedure: BYPASS GRAFT CAROTID-SUBCLAVIAN;  Surgeon: Waynetta Sandy, MD;  Location: [facility];  Service: Vascular;  Laterality: Left;  . DG TUMB RIGHT HAND Right    cyst removal  . IR THORACENTESIS ASP PLEURAL SPACE W/IMG GUIDE  05/31/2018  . THORACIC AORTIC ENDOVASCULAR STENT GRAFT Left 06/13/2018   Procedure: LEFT  SUBCLAVIAN TO CAROTID ARTERY TRANSPOSITION; THORACIC AORTIC ENDOVASCULAR STENT GRAFT using GORE CONFORMABLE THORACIC STENT GRAFT AND GORE TAG THORACIC ENDOPROSTHESIS; STENT LEFT COMMON CAROTID ARTERY, RIGHT COMMON-FEMORAL ENDARTERECTOMY WITH PATCH ANGIOPLASTY;  Surgeon: Waynetta Sandy, MD;  Location: [facility];  Service: Vascular;  Laterality: Left;  . TONSILLECTOMY    . TOTAL HIP ARTHROPLASTY Left 01/03/2013   Procedure: TOTAL HIP ARTHROPLASTY ANTERIOR APPROACH;  Surgeon: Mcarthur Rossetti, MD;  Location[facility];  Service: Orthopedics;  Laterality: Left;  Left Total Hip Arthroplasty, Anterior Approach  . TUBAL LIGATION    . VAGINA RECONSTRUCTION SURGERY  1966   vagina had closed up when 68 years old  . WOUND EXPLORATION Left 06/16/2018   Procedure: LEFT NECK WOUND EXPLORATION, CHEST TUBE INSERTION;  Surgeon: Waynetta Sandy, MD;  Location: [facility];  Service: Vascular;  Laterality: Left;   Family History  Problem Relation Age of Onset  . Heart disease Father   . Diabetes Mother   . Heart disease Mother        pacemaker  . Heart attack Mother  23  . Diabetes Sister   . Alcohol abuse Other   . Heart disease Other   . Hyperlipidemia Other   . Hypertension Other   . Diabetes Other   . Breast cancer Other    Social History   Socioeconomic History  . Marital status: Single    Spouse name: Not on file  . Number of children: 2  . Years of education: Not on file  . Highest education level: Not on file  Occupational History  . Occupation: retired  Scientific laboratory technician  . Financial resource strain: Not very hard  . Food insecurity    Worry: Never true    Inability: Never true  . Transportation needs    Medical: No    Non-medical: No  Tobacco Use  . Smoking status: Never Smoker  . Smokeless tobacco: Never Used  Substance and Sexual Activity  . Alcohol use: Not Currently  . Drug use: Never  . Sexual activity: Not Currently  Lifestyle  . Physical activity    Days per week: 4 days    Minutes per session: 60 min  . Stress: To some extent  Relationships  . Social connections    Talks on phone: More than three times a week    Gets together: More than three times a week    Attends religious service: More than 4 times per year    Active member of club or organization: Yes    Attends meetings of clubs or organizations: 1 to 4 times per year    Relationship status: Not on file  Other Topics Concern  . Not on file  Social History Narrative  . Not on file    Outpatient Encounter Medications as of 04/08/2019  Medication Sig  . acetaminophen (TYLENOL) 500 MG tablet Take 1,000 mg by mouth as needed for moderate pain.   Marland Kitchen digoxin (LANOXIN) 0.25 MG tablet TAKE 1 TABLET BY MOUTH EVERY MORNING  . DILT-XR 240 MG 24 hr capsule TAKE 1 CAPSULE BY MOUTH EVERY MORNING (Patient taking differently: Take 240 mg by mouth daily. )  . ferrous sulfate 325 (65 FE) MG tablet Take 325 mg by mouth once a week.   . fluticasone (FLONASE) 50 MCG/ACT nasal spray Place 2 sprays into both nostrils daily. (Patient taking differently: Place 2 sprays into both  nostrils daily as needed for allergies or rhinitis. )  . furosemide (LASIX) 20 MG tablet Take 20 mg by mouth daily as needed for fluid.   . Multiple Vitamin (MULTIVITAMIN) tablet Take 1 tablet by mouth daily.  . rosuvastatin (CRESTOR) 20 MG tablet TAKE 1 TABLET BY MOUTH EVERY DAY  . XARELTO 20 MG TABS tablet TAKE 1 TABLET(20 MG) BY MOUTH DAILY WITH SUPPER  . [DISCONTINUED] cyclobenzaprine (FLEXERIL) 10 MG tablet Take 1 tablet (10 mg total) by mouth 2 (two) times daily as needed  for muscle spasms.  . [DISCONTINUED] gabapentin (NEURONTIN) 100 MG capsule Take 1 capsule (100 mg total) by mouth 3 (three) times daily. (Patient not taking: Reported on 10/09/2018)  . [DISCONTINUED] gabapentin (NEURONTIN) 300 MG capsule Take 300 mg by mouth 2 (two) times daily.  . [DISCONTINUED] oxyCODONE (OXY IR/ROXICODONE) 5 MG immediate release tablet Take 1 tablet (5 mg total) by mouth every 6 (six) hours as needed for moderate pain or breakthrough pain.  . [DISCONTINUED] oxyCODONE-acetaminophen (PERCOCET) 7.5-325 MG tablet Take 1 tablet by mouth every 6 (six) hours as needed for severe pain.   No facility-administered encounter medications on file as of 04/08/2019.     Activities of Daily Living In your present state of health, do you have any difficulty performing the following activities: 04/08/2019 08/13/2018  Hearing? N N  Vision? N N  Difficulty concentrating or making decisions? N N  Walking or climbing stairs? N Y  Comment - due to left leg swelling and weakness  Dressing or bathing? N Y  Doing errands, shopping? N -  Preparing Food and eating ? N -  Using the Toilet? N -  In the past six months, have you accidently leaked urine? N -  Do you have problems with loss of bowel control? N -  Managing your Medications? N -  Managing your Finances? N -  Housekeeping or managing your Housekeeping? N -  Some recent data might be hidden    Patient Care Team: Hoyt Koch, MD as PCP - General  (Internal Medicine) [name], MD as PCP - Cardiology (Cardiology) Mcarthur Rossetti, MD (Orthopedic Surgery) Tanda Rockers, MD (Pulmonary Disease) Inda Castle, MD (Inactive) (Gastroenterology)    Assessment:   This is a routine wellness examination for Samantha Stevenson. Physical assessment deferred to PCP.  Exercise Activities and Dietary recommendations Current Exercise Habits: Home exercise routine, Type of exercise: walking;calisthenics;stretching, Time (Minutes): 30, Frequency (Times/Week): 4, Weekly Exercise (Minutes/Week): 120, Intensity: Mild, Exercise limited by: orthopedic condition(s)  Diet (meal preparation, eat out, water intake, caffeinated beverages, dairy products, fruits and vegetables): in general, a "healthy" diet  , well balanced. eats a variety of fruits and vegetables daily, limits salt, fat/cholesterol, sugar,carbohydrates,caffeine.  Reviewed heart healthy diet.     Goals    . I would like to lose 15 pounds and firm up my body     I will continue to exercise and eat healthy    . Patient Stated     Increase my physical activity by working-out. Eat small frequent meals and not skip meals. Enjoy life, family and worship God.        Fall Risk Fall Risk  04/08/2019 03/28/2018 03/21/2017  Falls in the past year? 0 No No  Number falls in past yr: 0 - -  Risk for fall due to : Impaired mobility;Impaired vision - -    Depression Screen PHQ 2/9 Scores 04/08/2019 03/28/2018 03/21/2017  PHQ - 2 Score 0 1 0  PHQ- 9 Score - 4 -     Cognitive Function       Ad8 score reviewed for issues:  Issues making decisions: no  Less interest in hobbies / activities: no  Repeats questions, stories (family complaining): no  Trouble using ordinary gadgets (microwave, computer, phone):no  Forgets the month or year: no  Mismanaging finances: no  Remembering appts: no  Daily problems with thinking and/or memory: no Ad8 score is= 0  Immunization History   Administered Date(s) Administered  . Influenza  Whole 07/23/2012  . Influenza, High Dose Seasonal PF 07/04/2018  . Influenza,inj,Quad PF,6+ Mos 07/16/2013, 06/19/2016  . Pneumococcal Conjugate-13 01/03/2018  . Pneumococcal Polysaccharide-23 04/08/2019  . Td 10/24/2011   Screening Tests Health Maintenance  Topic Date Due  . PNA vac Low Risk Adult (2 of 2 - PPSV23) 01/04/2019  . INFLUENZA VACCINE  05/24/2019  . MAMMOGRAM  08/23/2020  . TETANUS/TDAP  10/23/2021  . COLONOSCOPY  09/10/2022  . DEXA SCAN  Completed  . Hepatitis C Screening  Completed      Plan:    Reviewed health maintenance screenings with patient today and relevant education, vaccines, and/or referrals were provided.   Continue doing brain stimulating activities (puzzles, reading, adult coloring books, staying active) to keep memory sharp.   Continue to eat heart healthy diet (full of fruits, vegetables, whole grains, lean protein, water--limit salt, fat, and sugar intake) and increase physical activity as tolerated.  I have personally reviewed and noted the following in the patient's chart:   . Medical and social history . Use of alcohol, tobacco or illicit drugs  . Current medications and supplements . Functional ability and status . Nutritional status . Physical activity . Advanced directives . List of other physicians . Vitals . Screenings to include cognitive, depression, and falls . Referrals and appointments  In addition, I have reviewed and discussed with patient certain preventive protocols, quality metrics, and best practice recommendations. A written personalized care plan for preventive services as well as general preventive health recommendations were provided to patient.     Michiel Cowboy, RN  04/08/2019

## 2019-04-08 NOTE — Patient Instructions (Addendum)
Continue doing brain stimulating activities (puzzles, reading, adult coloring books, staying active) to keep memory sharp.   Continue to eat heart healthy diet (full of fruits, vegetables, whole grains, lean protein, water--limit salt, fat, and sugar intake) and increase physical activity as tolerated.   Samantha Stevenson , Thank you for taking time to come for your Medicare Wellness Visit. I appreciate your ongoing commitment to your health goals. Please review the following plan we discussed and let me know if I can assist you in the future.   These are the goals we discussed: Goals    . I would like to lose 15 pounds and firm up my body     I will continue to exercise and eat healthy    . Patient Stated     Increase my physical activity by working-out. Eat small frequent meals and not skip meals. Enjoy life, family and worship God.        This is a list of the screening recommended for you and due dates:  Health Maintenance  Topic Date Due  . Pneumonia vaccines (2 of 2 - PPSV23) 01/04/2019  . Flu Shot  05/24/2019  . Mammogram  08/23/2020  . Tetanus Vaccine  10/23/2021  . Colon Cancer Screening  09/10/2022  . DEXA scan (bone density measurement)  Completed  .  Hepatitis C: One time screening is recommended by Center for Disease Control  (CDC) for  adults born from [birth year] through 1963.   Completed    Preventive Care 24 Years and Older, Female Preventive care refers to lifestyle choices and visits with your health care provider that can promote health and wellness. What does preventive care include?  A yearly physical exam. This is also called an annual well check.  Dental exams once or twice a year.  Routine eye exams. Ask your health care provider how often you should have your eyes checked.  Personal lifestyle choices, including: ? Daily care of your teeth and gums. ? Regular physical activity. ? Eating a healthy diet. ? Avoiding tobacco and drug use. ? Limiting alcohol  use. ? Practicing safe sex. ? Taking low-dose aspirin every day. ? Taking vitamin and mineral supplements as recommended by your health care provider. What happens during an annual well check? The services and screenings done by your health care provider during your annual well check will depend on your age, overall health, lifestyle risk factors, and family history of disease. Counseling Your health care provider may ask you questions about your:  Alcohol use.  Tobacco use.  Drug use.  Emotional well-being.  Home and relationship well-being.  Sexual activity.  Eating habits.  History of falls.  Memory and ability to understand (cognition).  Work and work Statistician.  Reproductive health.  Screening You may have the following tests or measurements:  Height, weight, and BMI.  Blood pressure.  Lipid and cholesterol levels. These may be checked every 5 years, or more frequently if you are over 58 years old.  Skin check.  Lung cancer screening. You may have this screening every year starting at age 43 if you have a 30-pack-year history of smoking and currently smoke or have quit within the past 15 years.  Colorectal cancer screening. All adults should have this screening starting at age 43 and continuing until age 40. You will have tests every 1-10 years, depending on your results and the type of screening test. People at increased risk should start screening at an earlier age. Screening tests may include: ?  Guaiac-based fecal occult blood testing. ? Fecal immunochemical test (FIT). ? Stool DNA test. ? Virtual colonoscopy. ? Sigmoidoscopy. During this test, a flexible tube with a tiny camera (sigmoidoscope) is used to examine your rectum and lower colon. The sigmoidoscope is inserted through your anus into your rectum and lower colon. ? Colonoscopy. During this test, a long, thin, flexible tube with a tiny camera (colonoscope) is used to examine your entire colon and  rectum.  Hepatitis C blood test.  Hepatitis B blood test.  Sexually transmitted disease (STD) testing.  Diabetes screening. This is done by checking your blood sugar (glucose) after you have not eaten for a while (fasting). You may have this done every 1-3 years.  Bone density scan. This is done to screen for osteoporosis. You may have this done starting at age 85.  Mammogram. This may be done every 1-2 years. Talk to your health care provider about how often you should have regular mammograms. Talk with your health care provider about your test results, treatment options, and if necessary, the need for more tests. Vaccines Your health care provider may recommend certain vaccines, such as:  Influenza vaccine. This is recommended every year.  Tetanus, diphtheria, and acellular pertussis (Tdap, Td) vaccine. You may need a Td booster every 10 years.  Varicella vaccine. You may need this if you have not been vaccinated.  Zoster vaccine. You may need this after age 59.  Measles, mumps, and rubella (MMR) vaccine. You may need at least one dose of MMR if you were born in 1957 or later. You may also need a second dose.  Pneumococcal 13-valent conjugate (PCV13) vaccine. One dose is recommended after age 31.  Pneumococcal polysaccharide (PPSV23) vaccine. One dose is recommended after age 32.  Meningococcal vaccine. You may need this if you have certain conditions.  Hepatitis A vaccine. You may need this if you have certain conditions or if you travel or work in places where you may be exposed to hepatitis A.  Hepatitis B vaccine. You may need this if you have certain conditions or if you travel or work in places where you may be exposed to hepatitis B.  Haemophilus influenzae type b (Hib) vaccine. You may need this if you have certain conditions. Talk to your health care provider about which screenings and vaccines you need and how often you need them. This information is not intended to  replace advice given to you by your health care provider. Make sure you discuss any questions you have with your health care provider. Document Released: 11/05/2015 Document Revised: 11/29/2017 Document Reviewed: 08/10/2015 Elsevier Interactive Patient Education  2019 [employer].

## 2019-04-08 NOTE — Progress Notes (Signed)
Medical screening examination/treatment/procedure(s) were performed by non-physician practitioner and as supervising physician I was immediately available for consultation/collaboration. I agree with above. Gwendalyn A Crawford, MD 

## 2019-04-21 ENCOUNTER — Other Ambulatory Visit: Payer: Self-pay | Admitting: Internal Medicine

## 2019-04-28 ENCOUNTER — Telehealth: Payer: Self-pay

## 2019-04-28 NOTE — Telephone Encounter (Signed)
Can take tylenol for pain and ice the area.

## 2019-04-28 NOTE — Telephone Encounter (Signed)
Patient got the pneumonia 23 vaccine

## 2019-04-28 NOTE — Telephone Encounter (Signed)
Copied from [facility] [id number]. Topic: General - Other >> Apr 28, 2019  3:43 PM Jeri Cos wrote: Reason for CRM: Pt called stating that she received a shot in her arm on her 04/08/19 visit. She is complaining of pain in that arm, swelling, and is unable to sleep since receiving that shot. Pt would like to talk with Dr. Sharlet Salina about what she can do for the pain.

## 2019-04-28 NOTE — Telephone Encounter (Signed)
Patient states that she has been using the tylenol, heat and ice. Has a knot in her arm, swelling, and painful no matter what she takes or does. Unable to lift things with that arm, cannot stretch her arm out at all without pain, and and cannot sleep on that arm. Is unsure if she is having some sort of allergic reaction or is not taking well to the vaccine. She thought the pain and swelling would be gone by now and it is not. She is unsure of what to do

## 2019-04-29 ENCOUNTER — Encounter: Payer: Self-pay | Admitting: Internal Medicine

## 2019-04-29 ENCOUNTER — Ambulatory Visit (INDEPENDENT_AMBULATORY_CARE_PROVIDER_SITE_OTHER): Payer: Medicare Other | Admitting: Internal Medicine

## 2019-04-29 ENCOUNTER — Other Ambulatory Visit: Payer: Self-pay

## 2019-04-29 VITALS — BP 120/78 | HR 63 | Temp 97.9°F | Ht 64.0 in | Wt 157.0 lb

## 2019-04-29 DIAGNOSIS — M7989 Other specified soft tissue disorders: Secondary | ICD-10-CM

## 2019-04-29 NOTE — Patient Instructions (Signed)
We will get an ultrasound of the left arm to check the blood flow.

## 2019-04-29 NOTE — Telephone Encounter (Signed)
Can come in for visit if needed.

## 2019-04-29 NOTE — Telephone Encounter (Signed)
Noted  

## 2019-04-29 NOTE — Assessment & Plan Note (Signed)
Ordered arterial and venous study left arm to rule out drainage. She is taking xarelto without missing so blood clot is exceptionally unlikely. She does have CT angio chest and abdomen coming up in 2 weeks and she will keep that date as well.

## 2019-04-29 NOTE — Telephone Encounter (Signed)
Appointment scheduled.

## 2019-04-29 NOTE — Telephone Encounter (Signed)
Can you please make an appointment for patient to discus and to look at. Thank you

## 2019-04-29 NOTE — Progress Notes (Signed)
   Subjective:   Patient ID: Samantha Stevenson, female    DOB: [birth year], 68 y.o.   MRN: [id number]  HPI The patient is a 68 YO female coming in for problems with left arm pain and swelling. Started about 3 weeks ago after given pneumonia vaccine. She denies fevers or chills during that time. Denies cough or SOB. 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 Is having the swelling around the elbow. The shot was given lateral biceps region. She has taken tylenol and used ice on the area which has not helped. She feels that the hand is swelling also and is weak. Denies numbness. Has had mammogram which was normal this year already.   Review of Systems  Constitutional: Negative.   HENT: Negative.   Eyes: Negative.   Respiratory: Negative for cough, chest tightness and shortness of breath.   Cardiovascular: Negative for chest pain, palpitations and leg swelling.  Gastrointestinal: Negative for abdominal distention, abdominal pain, constipation, diarrhea, nausea and vomiting.  Musculoskeletal: Positive for joint swelling and myalgias.  Skin: Negative.   Neurological: Negative.   Psychiatric/Behavioral: Negative.     Objective:  Physical Exam Constitutional:      Appearance: She is well-developed.  HENT:     Head: Normocephalic and atraumatic.  Neck:     Musculoskeletal: Normal range of motion.  Cardiovascular:     Rate and Rhythm: Normal rate and regular rhythm.  Pulmonary:     Effort: Pulmonary effort is normal. No respiratory distress.     Breath sounds: Normal breath sounds. No wheezing or rales.  Abdominal:     General: Bowel sounds are normal. There is no distension.     Palpations: Abdomen is soft.     Tenderness: There is no abdominal tenderness. There is no rebound.  Musculoskeletal:        General: Tenderness present.     Comments: Left arm with swelling around the elbow. Area over lateral biceps where pneumonia vaccine given without hematoma or  swelling. There is some pain and bumps medial area. No axillary LAD, mild swelling in the hand which is not pitting, no numbness, hand with good capillary refill  Skin:    General: Skin is warm and dry.  Neurological:     Mental Status: She is alert and oriented to person, place, and time.     Coordination: Coordination normal.     Vitals:   04/29/19 1551  BP: 120/78  Pulse: 63  Temp: 97.9 F (36.6 C)  TempSrc: Oral  SpO2: 98%  Weight: 157 lb (71.2 kg)  Height: 5\' 4"  (1.626 m)    Assessment & Plan:

## 2019-05-01 ENCOUNTER — Other Ambulatory Visit: Payer: Self-pay | Admitting: Internal Medicine

## 2019-05-01 DIAGNOSIS — M7989 Other specified soft tissue disorders: Secondary | ICD-10-CM

## 2019-05-02 ENCOUNTER — Other Ambulatory Visit: Payer: Self-pay

## 2019-05-02 ENCOUNTER — Ambulatory Visit (HOSPITAL_COMMUNITY)
Admission: RE | Admit: 2019-05-02 | Discharge: 2019-05-02 | Disposition: A | Discharge status: Home / Self Care | Payer: Medicare Other | Source: Ambulatory Visit | Attending: Internal Medicine | Admitting: Internal Medicine

## 2019-05-02 ENCOUNTER — Ambulatory Visit (HOSPITAL_BASED_OUTPATIENT_CLINIC_OR_DEPARTMENT_OTHER)
Admission: RE | Admit: 2019-05-02 | Discharge: 2019-05-02 | Disposition: A | Discharge status: Home / Self Care | Payer: Medicare Other | Source: Ambulatory Visit | Attending: Internal Medicine | Admitting: Internal Medicine

## 2019-05-02 DIAGNOSIS — M7989 Other specified soft tissue disorders: Secondary | ICD-10-CM | POA: Diagnosis not present

## 2019-05-02 NOTE — Progress Notes (Signed)
Left upper ext venous  has been completed. Refer to [facility] under chart review to view preliminary results. Results given to [facility].  05/02/2019  4:29 PM Chamya Hunton, Bonnye Fava

## 2019-05-09 ENCOUNTER — Ambulatory Visit
Admission: RE | Admit: 2019-05-09 | Discharge: 2019-05-09 | Disposition: A | Discharge status: Home / Self Care | Payer: Medicare Other | Source: Ambulatory Visit | Attending: Vascular Surgery | Admitting: Vascular Surgery

## 2019-05-09 DIAGNOSIS — I7100 Dissection of unspecified site of aorta: Secondary | ICD-10-CM

## 2019-05-09 DIAGNOSIS — I359 Nonrheumatic aortic valve disorder, unspecified: Secondary | ICD-10-CM | POA: Diagnosis not present

## 2019-05-09 DIAGNOSIS — I723 Aneurysm of iliac artery: Secondary | ICD-10-CM | POA: Diagnosis not present

## 2019-05-09 DIAGNOSIS — M79603 Pain in arm, unspecified: Secondary | ICD-10-CM

## 2019-05-09 MED ORDER — IOPAMIDOL (ISOVUE-370) INJECTION 76%
75.0000 mL | Freq: Once | INTRAVENOUS | Status: AC | PRN
  Administered 2019-05-09: 10:00:00 75 mL via INTRAVENOUS

## 2019-05-16 ENCOUNTER — Ambulatory Visit (INDEPENDENT_AMBULATORY_CARE_PROVIDER_SITE_OTHER): Payer: Medicare Other | Admitting: Internal Medicine

## 2019-05-16 ENCOUNTER — Encounter: Payer: Self-pay | Admitting: Vascular Surgery

## 2019-05-16 ENCOUNTER — Other Ambulatory Visit: Payer: Self-pay

## 2019-05-16 ENCOUNTER — Other Ambulatory Visit (INDEPENDENT_AMBULATORY_CARE_PROVIDER_SITE_OTHER): Payer: Medicare Other

## 2019-05-16 ENCOUNTER — Ambulatory Visit (INDEPENDENT_AMBULATORY_CARE_PROVIDER_SITE_OTHER): Payer: Medicare Other | Admitting: Vascular Surgery

## 2019-05-16 ENCOUNTER — Inpatient Hospital Stay: Admission: RE | Admit: 2019-05-16 | Payer: Medicare Other | Source: Ambulatory Visit

## 2019-05-16 ENCOUNTER — Encounter: Payer: Self-pay | Admitting: Internal Medicine

## 2019-05-16 VITALS — BP 122/78 | HR 74 | Temp 98.5°F | Ht 64.0 in | Wt 160.0 lb

## 2019-05-16 VITALS — BP 130/74

## 2019-05-16 DIAGNOSIS — R310 Gross hematuria: Secondary | ICD-10-CM

## 2019-05-16 DIAGNOSIS — I7100 Dissection of unspecified site of aorta: Secondary | ICD-10-CM | POA: Diagnosis not present

## 2019-05-16 DIAGNOSIS — R1032 Left lower quadrant pain: Secondary | ICD-10-CM | POA: Insufficient documentation

## 2019-05-16 DIAGNOSIS — I10 Essential (primary) hypertension: Secondary | ICD-10-CM

## 2019-05-16 DIAGNOSIS — I4891 Unspecified atrial fibrillation: Secondary | ICD-10-CM | POA: Diagnosis not present

## 2019-05-16 LAB — BASIC METABOLIC PANEL
BUN: 17 mg/dL (ref 6–23)
CO2: 30 mEq/L (ref 19–32)
Calcium: 9.5 mg/dL (ref 8.4–10.5)
Chloride: 105 mEq/L (ref 96–112)
Creatinine, Ser: 0.76 mg/dL (ref 0.40–1.20)
GFR: 76.05 mL/min (ref >60.00)
Glucose, Bld: 84 mg/dL (ref 70–99)
Potassium: 4 mEq/L (ref 3.5–5.1)
Sodium: 142 mEq/L (ref 135–145)

## 2019-05-16 LAB — CBC WITH DIFFERENTIAL/PLATELET
Basophils Absolute: 0 10*3/uL (ref 0.0–0.1)
Basophils Relative: 0.3 % (ref 0.0–3.0)
Eosinophils Absolute: 0.2 10*3/uL (ref 0.0–0.7)
Eosinophils Relative: 2.7 % (ref 0.0–5.0)
HCT: 32.6 % — ABNORMAL LOW (ref 36.0–46.0)
Hemoglobin: 10.4 g/dL — ABNORMAL LOW (ref 12.0–15.0)
Lymphocytes Relative: 33.9 % (ref 12.0–46.0)
Lymphs Abs: 2.2 10*3/uL (ref 0.7–4.0)
MCHC: 32 g/dL (ref 30.0–36.0)
MCV: 87.6 fl (ref 78.0–100.0)
Monocytes Absolute: 0.6 10*3/uL (ref 0.1–1.0)
Monocytes Relative: 8.5 % (ref 3.0–12.0)
Neutro Abs: 3.5 10*3/uL (ref 1.4–7.7)
Neutrophils Relative %: 54.6 % (ref 43.0–77.0)
Platelets: 314 10*3/uL (ref 150.0–400.0)
RBC: 3.72 Mil/uL — ABNORMAL LOW (ref 3.87–5.11)
RDW: 15.2 % (ref 11.5–15.5)
WBC: 6.5 10*3/uL (ref 4.0–10.5)

## 2019-05-16 LAB — HEPATIC FUNCTION PANEL
ALT: 13 U/L (ref 0–35)
AST: 16 U/L (ref 0–37)
Albumin: 4 g/dL (ref 3.5–5.2)
Alkaline Phosphatase: 95 U/L (ref 39–117)
Bilirubin, Direct: 0.1 mg/dL (ref 0.0–0.3)
Total Bilirubin: 0.4 mg/dL (ref 0.2–1.2)
Total Protein: 7.4 g/dL (ref 6.0–8.3)

## 2019-05-16 LAB — URINALYSIS, ROUTINE W REFLEX MICROSCOPIC
Leukocytes,Ua: NEGATIVE
Nitrite: POSITIVE — AB
Specific Gravity, Urine: >=1.03 — AB (ref 1.000–1.030)
Total Protein, Urine: >=300 — AB
Urine Glucose: 100 — AB
Urobilinogen, UA: 2 — AB (ref 0.0–1.0)
pH: 5.5 (ref 5.0–8.0)

## 2019-05-16 LAB — LIPASE: Lipase: 25 U/L (ref 11.0–59.0)

## 2019-05-16 MED ORDER — TAMSULOSIN HCL 0.4 MG PO CAPS: 0.4000 mg | ORAL_CAPSULE | Freq: Every day | ORAL | 3 refills | Status: DC

## 2019-05-16 MED ORDER — HYDROCODONE-ACETAMINOPHEN 5-325 MG PO TABS: 1.0000 | ORAL_TABLET | Freq: Four times a day (QID) | ORAL | 0 refills | Status: DC | PRN

## 2019-05-16 NOTE — Progress Notes (Signed)
Virtual Visit via Telephone Note  Referring MD: Dr. Sharlet Salina  I connected with Samantha Stevenson on 05/16/2019 using the [employer] by telephone and verified that I was speaking with the correct person using two identifiers. Patient was located at home and I met the office.   The limitations of evaluation and management by telemedicine and the availability of in person appointments have been previously discussed with the patient and are documented in the patients chart. The patient expressed understanding and consented to proceed.  PCP: Samantha Koch, MD   History of Present Illness: Samantha Stevenson is a 68 y.o. 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  Subsequently had a lymphatic leak which was drained and did recover with medium chain fatty acid diet.  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  Now she states she is doing well.  Has no complaints today related to her previous surgery.  Is having some hematuria.  Does take Xarelto secondary to previous history of DVT.  She also takes aspirin.  Past Medical History:  Diagnosis Date  . Anemia   . Arthritis    "qwhere" (07/29/2018)  . Atrial fibrillation ([place])   . Degenerative arthritis of hip    s/p L THR 12/2012  . Dyspnea    since surgery in August 2019- "when I get worked up and walk a short distance"  . Dysrhythmia   . History of blood transfusion 05/2018   "related to OR"  . Hyperlipidemia   . Hypertension   . Migraines    mIgraines- none since blood pressure and lipids are under control  . Myocardial infarction ([place]) 2000   due to atrial fib  . Pulmonary emboli ([place]) 12/2012 dx   post op (L THR), anticoag x 80mo   Past Surgical History:  Procedure Laterality Date  . ABDOMINAL HYSTERECTOMY     "w/1 tube"  . ARTERY  REPAIR Left 08/15/2018   Procedure: LEFT BRACHIAL ARTERY EXPLORATION;  Surgeon: CWaynetta Sandy MD;  Location: [facility]  Service: Vascular;  Laterality: Left;  . Breast Ultrasound Left 04/16/13   Done @ breast center Impression: no malignancy appearance noted on the screen study is consistent with a summation shadow  . CARDIAC CATHETERIZATION    . CAROTID-SUBCLAVIAN BYPASS GRAFT Left 06/13/2018   Procedure: BYPASS GRAFT CAROTID-SUBCLAVIAN;  Surgeon: CWaynetta Sandy MD;  [facility]  Service: Vascular;  Laterality: Left;  . DG TUMB RIGHT HAND Right    cyst removal  . IR THORACENTESIS ASP PLEURAL SPACE W/IMG GUIDE  05/31/2018  . THORACIC AORTIC ENDOVASCULAR STENT GRAFT Left 06/13/2018   Procedure: LEFT  SUBCLAVIAN TO CAROTID ARTERY TRANSPOSITION; THORACIC AORTIC ENDOVASCULAR STENT GRAFT using GORE CONFORMABLE THORACIC STENT GRAFT AND GORE TAG THORACIC ENDOPROSTHESIS; STENT LEFT COMMON CAROTID ARTERY, RIGHT COMMON-FEMORAL ENDARTERECTOMY WITH PATCH ANGIOPLASTY;  Surgeon: CWaynetta Sandy MD;  [facility]  Service: Vascular;  Laterality: Left;  . TONSILLECTOMY    . TOTAL HIP ARTHROPLASTY Left 01/03/2013   Procedure: TOTAL HIP ARTHROPLASTY ANTERIOR APPROACH;  Surgeon: CMcarthur Rossetti MD;  Location: [facility];  Service: Orthopedics;  Laterality: Left;  Left Total Hip Arthroplasty, Anterior Approach  . TUBAL LIGATION    . VAGINA RECONSTRUCTION SURGERY  1966   vagina had closed up when 68years old  . WOUND EXPLORATION Left 06/16/2018   Procedure:  LEFT NECK WOUND EXPLORATION, CHEST TUBE INSERTION;  Surgeon: Waynetta Sandy, MD;  Location: [facility];  Service: Vascular;  Laterality: Left;    Current Meds  Medication Sig  . acetaminophen (TYLENOL) 500 MG tablet Take 1,000 mg by mouth as needed for moderate pain.   Marland Kitchen digoxin (LANOXIN) 0.25 MG tablet Take 1 tablet (250 mcg total) by mouth every morning. Need office visit for further refills  . diltiazem  (DILT-XR) 240 MG 24 hr capsule Take 1 capsule (240 mg total) by mouth every morning. Need office visit for further refills  . ferrous sulfate 325 (65 FE) MG tablet Take 325 mg by mouth once a week.   . fluticasone (FLONASE) 50 MCG/ACT nasal spray Place 2 sprays into both nostrils daily. (Patient taking differently: Place 2 sprays into both nostrils daily as needed for allergies or rhinitis. )  . furosemide (LASIX) 20 MG tablet Take 20 mg by mouth daily as needed for fluid.   . Multiple Vitamin (MULTIVITAMIN) tablet Take 1 tablet by mouth daily.  . rosuvastatin (CRESTOR) 20 MG tablet Take 1 tablet (20 mg total) by mouth daily. Need office visit for further refills  . XARELTO 20 MG TABS tablet TAKE 1 TABLET(20 MG) BY MOUTH DAILY WITH SUPPER    12 system ROS was negative unless otherwise noted in HPI   Observations/Objective: Vitals:   05/16/19 0924  BP: 130/74   Patient is awake alert oriented appears neurologically intact by phone call    IMPRESSION:  1. Stable stent graft from distal arch through descending thoracic  aorta.  2. Patent left carotid stent and bypass graft to subclavian.  3. Stable 1.2 cm fusiform dilatation of the mid left internal iliac  artery.  4. Coronary calcifications.  5. Sigmoid diverticulosis.  6. Additional ancillary findings as above. No acute or significant  interval change.   Assessment and Plan: 68 year old female above-noted procedure.  I reviewed CT scan findings with her today.  Is having hematuria which she has had in the past and she is on Xarelto for DVT.  Unknown whether she needs to continue Xarelto or not she should be on aspirin lifelong given the stenting.  I discussed calling her primary care physician which she will do at the conclusion of our discussion today.  She will follow-up with me in 1 year with repeat CT scan.  If there are issues before 1 year I will certainly see her sooner.    I discussed the assessment and treatment plan with  the patient. The patient was provided an opportunity to ask questions and all were answered. The patient agreed with the plan and demonstrated an understanding of the instructions.   The patient was advised to call back or seek an in-person evaluation if the symptoms worsen or if the condition fails to improve as anticipated.  I spent 7 minutes with the patient via telephone encounter.   Signed, Servando Snare [facility] Office: [phone]  05/16/2019, 9:35 AM

## 2019-05-16 NOTE — Patient Instructions (Addendum)
Please take all new medication as prescribed - the pain medication if needed, and flomax for probable kidney stone  (and ok to stop the flomax if no stone is seen on the CT scan or the stone is passed)  Please continue all other medications as before, and refills have been done if requested.  Please have the pharmacy call with any other refills you may need.  Please keep your appointments with your specialists as you may have planned  You will be contacted regarding the referral for: CT scan - to see [facility] now  Please go to the LAB in the Basement (turn left off the elevator) for the tests to be done today  You will be contacted by phone if any changes need to be made immediately.  Otherwise, you will receive a letter about your results with an explanation, but please check with MyChart first.  Please remember to sign up for MyChart if you have not done so, as this will be important to you in the future with finding out test results, communicating by private email, and scheduling acute appointments online when needed.

## 2019-05-16 NOTE — Progress Notes (Signed)
Subjective:    Patient ID: Samantha Stevenson, female    DOB: August 23, 1951, 68 y.o.   MRN: [id number]  HPI  Here to f/u with c/o acute onset abd sharp and dull pain LLQ severe at onset (some improved to mild now) x 3 days, somewhat radiating to the right lower quad but always starts at the LLQ; Denies worsening reflux, dysphagia, n/v, bowel change or GI bleeding, but has had significant daily gross blood in the urine and mild intermittent nausea.  Is currently on xarelto.  The last time she has seen blood in the urine was a time she was on plavix and xarelto before the plavix was stopped.  No hx of renal stones.  Denies urinary symptoms such as dysuria, frequency, urgency, flank pain. Past Medical History:  Diagnosis Date  . Anemia   . Arthritis    "qwhere" (07/29/2018)  . Atrial fibrillation ([place])   . Degenerative arthritis of hip    s/p L THR 12/2012  . Dyspnea    since surgery in August 2019- "when I get worked up and walk a short distance"  . Dysrhythmia   . History of blood transfusion 05/2018   "related to OR"  . Hyperlipidemia   . Hypertension   . Migraines    mIgraines- none since blood pressure and lipids are under control  . Myocardial infarction ([place]) 2000   due to atrial fib  . Pulmonary emboli ([place]) 12/2012 dx   post op (L THR), anticoag x 57mo   Past Surgical History:  Procedure Laterality Date  . ABDOMINAL HYSTERECTOMY     "w/1 tube"  . ARTERY REPAIR Left 08/15/2018   Procedure: LEFT BRACHIAL ARTERY EXPLORATION;  Surgeon: Waynetta Sandy, MD;  Location: [facility];  Service: Vascular;  Laterality: Left;  . Breast Ultrasound Left 04/16/13   Done @ breast center Impression: no malignancy appearance noted on the screen study is consistent with a summation shadow  . CARDIAC CATHETERIZATION    . CAROTID-SUBCLAVIAN BYPASS GRAFT Left 06/13/2018   Procedure: BYPASS GRAFT CAROTID-SUBCLAVIAN;  Surgeon: Waynetta Sandy, MD;  Location: [facility];  Service: Vascular;   Laterality: Left;  . DG TUMB RIGHT HAND Right    cyst removal  . IR THORACENTESIS ASP PLEURAL SPACE W/IMG GUIDE  05/31/2018  . THORACIC AORTIC ENDOVASCULAR STENT GRAFT Left 06/13/2018   Procedure: LEFT  SUBCLAVIAN TO CAROTID ARTERY TRANSPOSITION; THORACIC AORTIC ENDOVASCULAR STENT GRAFT using GORE CONFORMABLE THORACIC STENT GRAFT AND GORE TAG THORACIC ENDOPROSTHESIS; STENT LEFT COMMON CAROTID ARTERY, RIGHT COMMON-FEMORAL ENDARTERECTOMY WITH PATCH ANGIOPLASTY;  Surgeon: Waynetta Sandy, MD;  Location: [facility];  Service: Vascular;  Laterality: Left;  . TONSILLECTOMY    . TOTAL HIP ARTHROPLASTY Left 01/03/2013   Procedure: TOTAL HIP ARTHROPLASTY ANTERIOR APPROACH;  Surgeon: Mcarthur Rossetti, MD;  Location: [facility];  Service: Orthopedics;  Laterality: Left;  Left Total Hip Arthroplasty, Anterior Approach  . TUBAL LIGATION    . VAGINA RECONSTRUCTION SURGERY  1966   vagina had closed up when 68 years old  . WOUND EXPLORATION Left 06/16/2018   Procedure: LEFT NECK WOUND EXPLORATION, CHEST TUBE INSERTION;  Surgeon: Waynetta Sandy, MD;  Location: [facility];  Service: Vascular;  Laterality: Left;    reports that she has never smoked. She has never used smokeless tobacco. She reports previous alcohol use. She reports that she does not use drugs. family history includes Alcohol abuse in an other family member; Breast cancer in an other family member; Diabetes in her  mother, sister, and another family member; Heart attack (age of onset: 20) in her mother; Heart disease in her father, mother, and another family member; Hyperlipidemia in an other family member; Hypertension in an other family member. Allergies  Allergen Reactions  . Penicillins Swelling and Other (See Comments)    Severe swelling PATIENT HAS HAD A PCN REACTION WITH IMMEDIATE RASH, FACIAL/TONGUE/THROAT SWELLING, SOB, OR LIGHTHEADEDNESS WITH HYPOTENSION:  #  #  YES  #  #  Has patient had a PCN reaction causing severe rash  involving mucus membranes or skin necrosis: No PATIENT HAS HAD A PCN REACTION THAT REQUIRED HOSPITALIZATION:  #  #  YES  #  #  Has patient had a PCN reaction occurring within the last 10 years: No  . Latex Hives and Rash  . Banana Nausea Only  . Asa [Aspirin] Nausea And Vomiting    Makes stomach upset  . Celebrex [Celecoxib] Nausea And Vomiting    GI upset  . Nsaids Rash   Current Outpatient Medications on File Prior to Visit  Medication Sig Dispense Refill  . acetaminophen (TYLENOL) 500 MG tablet Take 1,000 mg by mouth as needed for moderate pain.     Marland Kitchen digoxin (LANOXIN) 0.25 MG tablet Take 1 tablet (250 mcg total) by mouth every morning. Need office visit for further refills 90 tablet 0  . diltiazem (DILT-XR) 240 MG 24 hr capsule Take 1 capsule (240 mg total) by mouth every morning. Need office visit for further refills 90 capsule 0  . ferrous sulfate 325 (65 FE) MG tablet Take 325 mg by mouth once a week.     . fluticasone (FLONASE) 50 MCG/ACT nasal spray Place 2 sprays into both nostrils daily. (Patient taking differently: Place 2 sprays into both nostrils daily as needed for allergies or rhinitis. ) 16 g 6  . furosemide (LASIX) 20 MG tablet Take 20 mg by mouth daily as needed for fluid.     . Multiple Vitamin (MULTIVITAMIN) tablet Take 1 tablet by mouth daily.    . rosuvastatin (CRESTOR) 20 MG tablet Take 1 tablet (20 mg total) by mouth daily. Need office visit for further refills 90 tablet 0  . XARELTO 20 MG TABS tablet TAKE 1 TABLET(20 MG) BY MOUTH DAILY WITH SUPPER 90 tablet 1   No current facility-administered medications on file prior to visit.    Review of Systems  Constitutional: Negative for other unusual diaphoresis or sweats HENT: Negative for ear discharge or swelling Eyes: Negative for other worsening visual disturbances Respiratory: Negative for stridor or other swelling  Gastrointestinal: Negative for worsening distension or other blood Genitourinary: Negative for  retention or other urinary change Musculoskeletal: Negative for other MSK pain or swelling Skin: Negative for color change or other new lesions Neurological: Negative for worsening tremors and other numbness  Psychiatric/Behavioral: Negative for worsening agitation or other fatigue All other system neg per pt    Objective:   Physical Exam BP 122/78   Pulse 74   Temp 98.5 F (36.9 C) (Oral)   Ht 5\' 4"  (1.626 m)   Wt 160 lb (72.6 kg)   SpO2 95%   BMI 27.46 kg/m  VS noted,  Constitutional: Pt appears in NAD HENT: Head: NCAT.  Right Ear: External ear normal.  Left Ear: External ear normal.  Eyes: . Pupils are equal, round, and reactive to light. Conjunctivae and EOM are normal Nose: without d/c or deformity Neck: Neck supple. Gross normal ROM Cardiovascular: Normal rate and regular rhythm.  Pulmonary/Chest: Effort normal and breath sounds without rales or wheezing.  Abd:  Soft, NT, ND, + BS, no organomegaly - benign exam Neurological: Pt is alert. At baseline orientation, motor grossly intact Skin: Skin is warm. No rashes, other new lesions, no LE edema Psychiatric: Pt behavior is normal without agitation  No other exam fidnings Lab Results  Component Value Date   WBC 6.5 05/16/2019   HGB 10.4 (L) 05/16/2019   HCT 32.6 (L) 05/16/2019   PLT 314.0 05/16/2019   GLUCOSE 84 05/16/2019   CHOL 189 01/03/2018   TRIG 128.0 01/03/2018   HDL 46.70 01/03/2018   LDLCALC 117 (H) 01/03/2018   ALT 13 05/16/2019   AST 16 05/16/2019   NA 142 05/16/2019   K 4.0 05/16/2019   CL 105 05/16/2019   CREATININE 0.76 05/16/2019   BUN 17 05/16/2019   CO2 30 05/16/2019   TSH 2.24 08/27/2013   INR 1.84 10/09/2018   HGBA1C 6.2 06/19/2016      Assessment & Plan:

## 2019-05-17 ENCOUNTER — Encounter: Payer: Self-pay | Admitting: Internal Medicine

## 2019-05-17 NOTE — Assessment & Plan Note (Addendum)
Etiology unclear, has recent CTA abdpelvis just last wk without stone seen; but for repeat CT abd/pelv no CM to r/o stone, to continue xarelto

## 2019-05-17 NOTE — Assessment & Plan Note (Signed)
stable overall by history and exam, recent data reviewed with pt, and pt to continue medical treatment as before,  to f/u any worsening symptoms or concerns  

## 2019-05-17 NOTE — Assessment & Plan Note (Addendum)
Etiology unclear, but for labs as orderd,, and CT as noted  Note:  Total time for pt hx, exam, review of record with pt in the room, determination of diagnoses and plan for further eval and tx is > 40 min, with over 50% spent in coordination and counseling of patient including the differential dx, tx, further evaluation and other management of LLQ pain, gross hematuria, HTN, ,afib

## 2019-05-17 NOTE — Assessment & Plan Note (Signed)
stable overall by history and exam, recent data reviewed with pt, and pt to continue medical treatment as before,  to f/u any worsening symptoms or concerns, no volume change today

## 2019-05-18 LAB — URINE CULTURE
MICRO NUMBER:: 701549
SPECIMEN QUALITY:: ADEQUATE

## 2019-05-19 ENCOUNTER — Telehealth: Payer: Self-pay

## 2019-05-19 ENCOUNTER — Other Ambulatory Visit: Payer: Self-pay | Admitting: Internal Medicine

## 2019-05-19 ENCOUNTER — Ambulatory Visit
Admission: RE | Admit: 2019-05-19 | Discharge: 2019-05-19 | Disposition: A | Discharge status: Home / Self Care | Payer: Medicare Other | Source: Ambulatory Visit | Attending: Internal Medicine | Admitting: Internal Medicine

## 2019-05-19 DIAGNOSIS — R1030 Lower abdominal pain, unspecified: Secondary | ICD-10-CM

## 2019-05-19 DIAGNOSIS — R319 Hematuria, unspecified: Secondary | ICD-10-CM

## 2019-05-19 DIAGNOSIS — K5730 Diverticulosis of large intestine without perforation or abscess without bleeding: Secondary | ICD-10-CM | POA: Diagnosis not present

## 2019-05-19 DIAGNOSIS — R1032 Left lower quadrant pain: Secondary | ICD-10-CM

## 2019-05-19 NOTE — Telephone Encounter (Signed)
Pt has viewed results via MyChart  

## 2019-05-19 NOTE — Telephone Encounter (Signed)
-----   Message from Biagio Borg, MD sent at 05/19/2019 12:20 PM EDT ----- Left message on MyChart, pt to cont same tx except  The test results show that your current treatment is OK, as the CT scan does not show kidney stones as expected.  We therefore need to refer you to Urology due to the pain and blood in the urine that is not well explained.  Hopefully you should hear soon.    Rinaldo Macqueen to please inform pt, I will do referral

## 2019-07-02 ENCOUNTER — Telehealth: Payer: Self-pay | Admitting: Internal Medicine

## 2019-07-02 ENCOUNTER — Ambulatory Visit (INDEPENDENT_AMBULATORY_CARE_PROVIDER_SITE_OTHER): Payer: Medicare Other

## 2019-07-02 ENCOUNTER — Other Ambulatory Visit: Payer: Self-pay

## 2019-07-02 DIAGNOSIS — Z23 Encounter for immunization: Secondary | ICD-10-CM | POA: Diagnosis not present

## 2019-07-02 NOTE — Telephone Encounter (Signed)
Patient has dropped off Parking Placard renewal form.  I am placing in [place] box.

## 2019-07-17 ENCOUNTER — Ambulatory Visit (INDEPENDENT_AMBULATORY_CARE_PROVIDER_SITE_OTHER): Payer: Medicare Other | Admitting: Internal Medicine

## 2019-07-17 ENCOUNTER — Encounter: Payer: Self-pay | Admitting: Internal Medicine

## 2019-07-17 ENCOUNTER — Other Ambulatory Visit: Payer: Self-pay

## 2019-07-17 VITALS — BP 126/74 | HR 75 | Temp 98.1°F | Ht 64.0 in | Wt 160.6 lb

## 2019-07-17 DIAGNOSIS — I4891 Unspecified atrial fibrillation: Secondary | ICD-10-CM | POA: Diagnosis not present

## 2019-07-17 DIAGNOSIS — K5900 Constipation, unspecified: Secondary | ICD-10-CM | POA: Diagnosis not present

## 2019-07-17 DIAGNOSIS — M542 Cervicalgia: Secondary | ICD-10-CM

## 2019-07-17 DIAGNOSIS — G8929 Other chronic pain: Secondary | ICD-10-CM | POA: Diagnosis not present

## 2019-07-17 MED ORDER — POLYETHYLENE GLYCOL 3350 17 GM/SCOOP PO POWD: 34.0000 g | Freq: Two times a day (BID) | ORAL | 5 refills | Status: DC

## 2019-07-17 MED ORDER — MAGNESIUM CITRATE PO SOLN: 1.0000 | Freq: Every day | ORAL | 3 refills | Status: DC | PRN

## 2019-07-17 NOTE — Assessment & Plan Note (Signed)
Rx for magnesium citrate and miralax to help clear bowels. She wishes to see GI. Call back if no improvement or worsening pain. She has known diverticulosis which is severe.

## 2019-07-17 NOTE — Patient Instructions (Addendum)
We have sent in magnesium citrate to drink today a bottle. If unable to go to the bathroom drink a second bottle tomorrow.   Start doing miralax 2 caps twice a day.   Call back if not improving and we will get you in with GI as well as the neck specialist.

## 2019-07-17 NOTE — Progress Notes (Signed)
   Subjective:   Patient ID: Samantha Stevenson, female    DOB: 10/28/1950, 68 y.o.   MRN: [id number]  HPI The patient is a 68 YO female coming in for several concerns including constipation (last good BM last weekend, took miralax on Sunday which did not work, no BM all week, small BM today which did not alleviate symptoms, bloating and discomfort, most pain low left abdomen, still passing lots of gas, nausea but no vomiting, decreased appetite) and neck pain (going on for some time, had seen someone who told her she had a pinched nerve, denies trying anything except massage and tylenol for pain, denies fall or injury, rare numbness in left arm, pain in the neck, 3/10 now), she also has questions about A fib (why she is on xarelto and if she can stop now that she is far out from the blood clot in lungs, she denies blood in stool or bleeding, wants to stop if able).   Review of Systems  Constitutional: Negative.   HENT: Negative.   Eyes: Negative.   Respiratory: Negative for cough, chest tightness and shortness of breath.   Cardiovascular: Negative for chest pain, palpitations and leg swelling.  Gastrointestinal: Positive for abdominal distention, abdominal pain, constipation and nausea. Negative for anal bleeding, blood in stool, diarrhea, rectal pain and vomiting.  Musculoskeletal: Positive for neck pain. Negative for arthralgias, back pain, gait problem, joint swelling and neck stiffness.  Skin: Negative.   Neurological: Negative.   Psychiatric/Behavioral: Negative.     Objective:  Physical Exam Constitutional:      Appearance: She is well-developed.  HENT:     Head: Normocephalic and atraumatic.  Neck:     Musculoskeletal: Normal range of motion.  Cardiovascular:     Rate and Rhythm: Normal rate and regular rhythm.  Pulmonary:     Effort: Pulmonary effort is normal. No respiratory distress.     Breath sounds: Normal breath sounds. No wheezing or rales.  Abdominal:     General:  Bowel sounds are normal. There is distension.     Palpations: Abdomen is soft.     Tenderness: There is abdominal tenderness. There is no rebound.     Comments: Pain left lower quadrant, normal BS and no mass, no rebound or guarding  Skin:    General: Skin is warm and dry.  Neurological:     Mental Status: She is alert and oriented to person, place, and time.     Coordination: Coordination normal.     Vitals:   07/17/19 1542  BP: 126/74  Pulse: 75  Temp: 98.1 F (36.7 C)  TempSrc: Oral  SpO2: 98%  Weight: 160 lb 9.6 oz (72.8 kg)  Height: 5\' 4"  (1.626 m)    Assessment & Plan:

## 2019-07-17 NOTE — Assessment & Plan Note (Signed)
Advised she can consult with cardiology if they think she is in sinus all the time we can consider stopping anticoagulation but chads2vasc score 5 or more so she would be needing lifelong anticoagulation and spent time explaining to her about this today and would recommend staying on xarelto at this time.

## 2019-07-22 ENCOUNTER — Other Ambulatory Visit: Payer: Self-pay

## 2019-07-22 MED ORDER — DILTIAZEM HCL ER 240 MG PO CP24: 240.0000 mg | ORAL_CAPSULE | Freq: Every morning | ORAL | 1 refills | Status: DC

## 2019-07-22 MED ORDER — ROSUVASTATIN CALCIUM 20 MG PO TABS: 20.0000 mg | ORAL_TABLET | Freq: Every day | ORAL | 1 refills | Status: DC

## 2019-07-22 MED ORDER — DIGOXIN 250 MCG PO TABS: 250.0000 ug | ORAL_TABLET | Freq: Every morning | ORAL | 1 refills | Status: DC

## 2019-07-25 ENCOUNTER — Encounter: Payer: Self-pay | Admitting: Gastroenterology

## 2019-07-25 ENCOUNTER — Other Ambulatory Visit: Payer: Self-pay | Admitting: Internal Medicine

## 2019-07-25 DIAGNOSIS — G8929 Other chronic pain: Secondary | ICD-10-CM

## 2019-07-25 DIAGNOSIS — K5900 Constipation, unspecified: Secondary | ICD-10-CM

## 2019-07-25 DIAGNOSIS — M542 Cervicalgia: Secondary | ICD-10-CM

## 2019-07-31 DIAGNOSIS — M5412 Radiculopathy, cervical region: Secondary | ICD-10-CM | POA: Diagnosis not present

## 2019-07-31 DIAGNOSIS — I10 Essential (primary) hypertension: Secondary | ICD-10-CM | POA: Diagnosis not present

## 2019-08-04 ENCOUNTER — Other Ambulatory Visit: Payer: Self-pay | Admitting: Neurosurgery

## 2019-08-04 DIAGNOSIS — M5412 Radiculopathy, cervical region: Secondary | ICD-10-CM

## 2019-08-23 ENCOUNTER — Ambulatory Visit
Admission: RE | Admit: 2019-08-23 | Discharge: 2019-08-23 | Disposition: A | Discharge status: Home / Self Care | Payer: Medicare Other | Source: Ambulatory Visit | Attending: Neurosurgery | Admitting: Neurosurgery

## 2019-08-23 ENCOUNTER — Other Ambulatory Visit: Payer: Self-pay

## 2019-08-23 DIAGNOSIS — M5412 Radiculopathy, cervical region: Secondary | ICD-10-CM

## 2019-08-23 DIAGNOSIS — M4802 Spinal stenosis, cervical region: Secondary | ICD-10-CM | POA: Diagnosis not present

## 2019-08-26 ENCOUNTER — Encounter: Payer: Self-pay | Admitting: Gastroenterology

## 2019-08-26 ENCOUNTER — Telehealth: Payer: Self-pay | Admitting: *Deleted

## 2019-08-26 ENCOUNTER — Ambulatory Visit (INDEPENDENT_AMBULATORY_CARE_PROVIDER_SITE_OTHER): Payer: Medicare Other | Admitting: Gastroenterology

## 2019-08-26 VITALS — BP 126/84 | HR 72 | Temp 98.2°F | Ht 64.0 in | Wt 159.6 lb

## 2019-08-26 DIAGNOSIS — R1310 Dysphagia, unspecified: Secondary | ICD-10-CM | POA: Diagnosis not present

## 2019-08-26 DIAGNOSIS — K5909 Other constipation: Secondary | ICD-10-CM

## 2019-08-26 DIAGNOSIS — R140 Abdominal distension (gaseous): Secondary | ICD-10-CM

## 2019-08-26 DIAGNOSIS — D649 Anemia, unspecified: Secondary | ICD-10-CM

## 2019-08-26 DIAGNOSIS — Z1159 Encounter for screening for other viral diseases: Secondary | ICD-10-CM | POA: Diagnosis not present

## 2019-08-26 DIAGNOSIS — R1084 Generalized abdominal pain: Secondary | ICD-10-CM

## 2019-08-26 DIAGNOSIS — R1319 Other dysphagia: Secondary | ICD-10-CM

## 2019-08-26 MED ORDER — NA SULFATE-K SULFATE-MG SULF 17.5-3.13-1.6 GM/177ML PO SOLN: 1.0000 | Freq: Once | ORAL | 0 refills | Status: AC

## 2019-08-26 NOTE — Patient Instructions (Addendum)
If you are age 66 or older, your body mass index should be between 23-30. Your Body mass index is 27.4 kg/m. If this is out of the aforementioned range listed, please consider follow up with your Primary Care Provider.  If you are age 21 or younger, your body mass index should be between 19-25. Your Body mass index is 27.4 kg/m. If this is out of the aformentioned range listed, please consider follow up with your Primary Care Provider.   You have been scheduled for an endoscopy and colonoscopy. Please follow the written instructions given to you at your visit today. Please pick up your prep supplies at the pharmacy within the next 1-3 days. If you use inhalers (even only as needed), please bring them with you on the day of your procedure. Your physician has requested that you go to www.startemmi.com and enter the access code given to you at your visit today. This web site gives a general overview about your procedure. However, you should still follow specific instructions given to you by our office regarding your preparation for the procedure.  You will be contaced by our office prior to your procedure for directions on holding your Xarelto.  If you do not hear from our office 1 week prior to your scheduled procedure, please call [phone] to discuss.   Food Guidelines for gas and bloating  Many people have difficulty digesting certain foods, causing a variety of distressing and embarrassing symptoms such as abdominal pain, bloating and gas.  These foods may need to be avoided or consumed in small amounts.  Here are some tips that might be helpful for you.  1.   Lactose intolerance is the difficulty or complete inability to digest lactose, the natural sugar in milk and anything made from milk.  This condition is harmless, common, and can begin any time during life.  Some people can digest a modest amount of lactose while others cannot tolerate any.  Also, not all dairy products contain equal  amounts of lactose.  For example, hard cheeses such as parmesan have less lactose than soft cheeses such as cheddar.  Yogurt has less lactose than milk or cheese.  Many packaged foods (even many brands of bread) have milk, so read ingredient lists carefully.  It is difficult to test for lactose intolerance, so just try avoiding lactose as much as possible for a week and see what happens with your symptoms.  If you seem to be lactose intolerant, the best plan is to avoid it (but make sure you get calcium from another source).  The next best thing is to use lactase enzyme supplements, available over the counter everywhere.  Just know that many lactose intolerant people need to take several tablets with each serving of dairy to avoid symptoms.  Lastly, a lot of restaurant food is made with milk or butter.  Many are things you might not suspect, such as mashed potatoes, rice and pasta (cooked with butter) and "grilled" items.  If you are lactose intolerant, it never hurts to ask your server what has milk or butter.  2.   Fiber is an important part of your diet, but not all fiber is well-tolerated.  Insoluble fiber such as bran is often consumed by normal gut bacteria and converted into gas.  Soluble fiber such as oats, squash, carrots and green beans are typically tolerated better.  3.   Some types of carbohydrates can be poorly digested.  Examples include: fructose (apples, cherries, pears, raisins and  other dried fruits), fructans (onions, zucchini, large amounts of wheat), sorbitol/mannitol/xylitol and sucralose/Splenda (common artificial sweeteners), and raffinose (lentils, broccoli, cabbage, asparagus, brussel sprouts, many types of beans).  Do a Development worker, community for [employer] and you will find helpful information. Beano, a dietary supplement, will often help with raffinose-containing foods.  As with lactase tablets, you may need several per serving.  4.   Whenever possible, avoid processed food&meats and  chemical additives.  High fructose corn syrup, a common sweetener, may be difficult to digest.  Eggs and soy (comes from the soybean, and added to many foods now) are other common bloating/gassy foods.  - Dr. Herma [facility]

## 2019-08-26 NOTE — Progress Notes (Signed)
[facility] Consult Note:  History: Samantha Stevenson 08/26/2019  Referring provider: Hoyt Koch, MD  Reason for consult/chief complaint: Constipation (Chronic, abdominal left sided pain, pea sized stool, bloating, loss of appetite, nausea, large amounts of mucous) and Dysphagia (Food lodged in throat)   Subjective  HPI:  This is a very pleasant 68 year old woman referred by primary care for chronic abdominal pain and constipation.  She has been troubled by constipation for years, but in the last year it seems worse she only passes small "pellets" of stool, often when she sits to urinate.  She has lower abdominal pain that is sometimes sharp and shooting with bloating.  At times this seems to cause severe "backup" where food does not seem to be passing and it causes loss of appetite and nausea.  She also has passage of mucus stools.  In addition, she has solid food dysphagia where it feels stuck in the lower chest at times.  It is difficult for her to tell whether or not this is related to periods of worsened constipation described above.  She denies rectal bleeding.  Appetite generally good and weight stable.  She tried a stool softener without much improvement, a couple doses of MiraLAX did not seem to help.  She took a half bottle of magnesium citrate at the suggestion of primary care and this caused a significant temporary relief of the constipation.  Samantha Stevenson was advised to start iron by her vascular surgeon when she was noted to be anemic postoperatively in 2018.  That anemia has persisted and is noted to be normocytic.  She has not had a hematology evaluation or lab anemia work-up with primary care.  She is only able to take the iron once a week because it worsens constipation. Colonoscopy by Dr. Deatra Ina November 2013 found severe sigmoid diverticulosis, no polyps. ROS:  Review of Systems  Constitutional: Positive for fatigue. Negative for appetite change  and unexpected weight change.  HENT: Negative for mouth sores and voice change.   Eyes: Negative for pain and redness.  Respiratory: Negative for cough and shortness of breath.   Cardiovascular: Negative for chest pain and palpitations.  Genitourinary: Negative for dysuria and hematuria.  Musculoskeletal: Positive for arthralgias. Negative for myalgias.  Skin: Negative for pallor and rash.  Neurological: Negative for weakness and headaches.  Hematological: Negative for adenopathy.     Past Medical History: Past Medical History:  Diagnosis Date  . Anemia   . Arthritis    "qwhere" (07/29/2018)  . Atrial fibrillation ([place])   . Degenerative arthritis of hip    s/p L THR 12/2012  . Dyspnea    since surgery in August 2019- "when I get worked up and walk a short distance"  . Dysrhythmia   . History of blood transfusion 05/2018   "related to OR"  . Hyperlipidemia   . Hypertension   . Migraines    mIgraines- none since blood pressure and lipids are under control  . Myocardial infarction ([place]) 2000   due to atrial fib  . 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  No [facility] hematology encounters on file.  Past Surgical History: Past Surgical History:  Procedure Laterality Date  . ABDOMINAL HYSTERECTOMY     "w/1 tube"  .  ARTERY REPAIR Left 08/15/2018   Procedure: LEFT BRACHIAL ARTERY EXPLORATION;  Surgeon: Waynetta Sandy, MD;  Location: [facility];  Service: Vascular;  Laterality: Left;  . Breast Ultrasound Left 04/16/13   Done @ breast center Impression: no malignancy appearance noted on the screen study is consistent with a summation shadow  . CARDIAC CATHETERIZATION    . CAROTID-SUBCLAVIAN BYPASS GRAFT Left 06/13/2018    Procedure: BYPASS GRAFT CAROTID-SUBCLAVIAN;  Surgeon: Waynetta Sandy, MD;  Location: [facility];  Service: Vascular;  Laterality: Left;  . DG TUMB RIGHT HAND Right    cyst removal  . IR THORACENTESIS ASP PLEURAL SPACE W/IMG GUIDE  05/31/2018  . THORACIC AORTIC ENDOVASCULAR STENT GRAFT Left 06/13/2018   Procedure: LEFT  SUBCLAVIAN TO CAROTID ARTERY TRANSPOSITION; THORACIC AORTIC ENDOVASCULAR STENT GRAFT using GORE CONFORMABLE THORACIC STENT GRAFT AND GORE TAG THORACIC ENDOPROSTHESIS; STENT LEFT COMMON CAROTID ARTERY, RIGHT COMMON-FEMORAL ENDARTERECTOMY WITH PATCH ANGIOPLASTY;  Surgeon: Waynetta Sandy, MD;  Location: [facility];  Service: Vascular;  Laterality: Left;  . TONSILLECTOMY    . TOTAL HIP ARTHROPLASTY Left 01/03/2013   Procedure: TOTAL HIP ARTHROPLASTY ANTERIOR APPROACH;  Surgeon: Mcarthur Rossetti, MD;  Location: [facility];  Service: Orthopedics;  Laterality: Left;  Left Total Hip Arthroplasty, Anterior Approach  . TUBAL LIGATION    . VAGINA RECONSTRUCTION SURGERY  1966   vagina had closed up when 68 years old  . WOUND EXPLORATION Left 06/16/2018   Procedure: LEFT NECK WOUND EXPLORATION, CHEST TUBE INSERTION;  Surgeon: Waynetta Sandy, MD;  Location: [facility];  Service: Vascular;  Laterality: Left;     Family History: Family History  Problem Relation Age of Onset  . Heart disease Father   . Diabetes Mother   . Heart disease Mother        pacemaker  . Heart attack Mother 60  . Diabetes Sister   . Alcohol abuse Other   . Heart disease Other   . Hyperlipidemia Other   . Hypertension Other   . Diabetes Other   . Breast cancer Other     Social History: Social History   Socioeconomic History  . Marital status: Single    Spouse name: Not on file  . Number of children: 2  . Years of education: Not on file  . Highest education level: Not on file  Occupational History  . Occupation: retired  Scientific laboratory technician  . Financial resource strain: Not very hard  . Food  insecurity    Worry: Never true    Inability: Never true  . Transportation needs    Medical: No    Non-medical: No  Tobacco Use  . Smoking status: Never Smoker  . Smokeless tobacco: Never Used  Substance and Sexual Activity  . Alcohol use: Not Currently  . Drug use: Never  . Sexual activity: Not Currently  Lifestyle  . Physical activity    Days per week: 4 days    Minutes per session: 60 min  . Stress: To some extent  Relationships  . Social connections    Talks on phone: More than three times a week    Gets together: More than three times a week    Attends religious service: More than 4 times per year    Active member of club or organization: Yes    Attends meetings of clubs or organizations: 1 to 4 times per year    Relationship status: Not on file  Other Topics Concern  . Not on file  Social  History Narrative  . Not on file    Allergies: Allergies  Allergen Reactions  . Penicillins Swelling and Other (See Comments)    Severe swelling PATIENT HAS HAD A PCN REACTION WITH IMMEDIATE RASH, FACIAL/TONGUE/THROAT SWELLING, SOB, OR LIGHTHEADEDNESS WITH HYPOTENSION:  #  #  YES  #  #  Has patient had a PCN reaction causing severe rash involving mucus membranes or skin necrosis: No PATIENT HAS HAD A PCN REACTION THAT REQUIRED HOSPITALIZATION:  #  #  YES  #  #  Has patient had a PCN reaction occurring within the last 10 years: No  . Latex Hives and Rash  . Banana Nausea Only  . Asa [Aspirin] Nausea And Vomiting    Makes stomach upset  . Celebrex [Celecoxib] Nausea And Vomiting    GI upset  . Nsaids Rash    Outpatient Meds: Current Outpatient Medications  Medication Sig Dispense Refill  . acetaminophen (TYLENOL) 500 MG tablet Take 1,000 mg by mouth as needed for moderate pain.     Marland Kitchen digoxin (LANOXIN) 0.25 MG tablet Take 1 tablet (250 mcg total) by mouth every morning. 90 tablet 1  . diltiazem (DILT-XR) 240 MG 24 hr capsule Take 1 capsule (240 mg total) by mouth every  morning. 90 capsule 1  . ferrous sulfate 325 (65 FE) MG tablet Take 325 mg by mouth once a week.     . furosemide (LASIX) 20 MG tablet Take 20 mg by mouth daily as needed for fluid.     . magnesium citrate SOLN Take 296 mLs (1 Bottle total) by mouth daily as needed for severe constipation. 500 mL 3  . Multiple Vitamin (MULTIVITAMIN) tablet Take 1 tablet by mouth daily.    . polyethylene glycol powder (GLYCOLAX/MIRALAX) 17 GM/SCOOP powder Take 34 g by mouth 2 (two) times daily. 3350 g 5  . rosuvastatin (CRESTOR) 20 MG tablet Take 1 tablet (20 mg total) by mouth daily. 90 tablet 1  . XARELTO 20 MG TABS tablet TAKE 1 TABLET(20 MG) BY MOUTH DAILY WITH SUPPER 90 tablet 1  . fluticasone (FLONASE) 50 MCG/ACT nasal spray Place 2 sprays into both nostrils daily. (Patient taking differently: Place 2 sprays into both nostrils daily as needed for allergies or rhinitis. ) 16 g 6  . Na Sulfate-K Sulfate-Mg Sulf 17.5-3.13-1.6 GM/177ML SOLN Take 1 kit by mouth once for 1 dose. 354 mL 0   No current facility-administered medications for this visit.       ___________________________________________________________________ Objective   Exam:  BP 126/84   Pulse 72   Temp 98.2 F (36.8 C)   Ht _0  (1.626 m)   Wt 159 lb 9.6 oz (72.4 kg)   BMI 27.40 kg/m    General: Well-appearing  Eyes: sclera anicteric, no redness  ENT: oral mucosa moist without lesions, no cervical or supraclavicular lymphadenopathy  CV: RRR without murmur, S1/S2, no JVD, no peripheral edema  Resp: clear to auscultation bilaterally, normal RR and effort noted  GI: soft, mid abdominal tenderness, with active bowel sounds. No guarding or palpable organomegaly noted.  Skin; warm and dry, no rash or jaundice noted  Neuro: awake, alert and oriented x 3. Normal gross motor function and fluent speech  Labs:  CBC Latest Ref Rng & Units 05/16/2019 10/09/2018 09/13/2018  WBC 4.0 - 10.5 K/uL 6.5 7.3 8.1  Hemoglobin 12.0 - 15.0  g/dL 10.4(L) 10.1(L) 9.5(L)  Hematocrit 36.0 - 46.0 % 32.6(L) 33.8(L) 30.4(L)  Platelets 150.0 - 400.0 K/uL 314.0 325  390   Normal MCV.  No iron studies on file since 2014, at which time ferritin was 19  CMP Latest Ref Rng & Units 05/16/2019 10/09/2018 09/13/2018  Glucose 70 - 99 mg/dL 84 104(H) 87  BUN 6 - 23 mg/dL _0 Creatinine 0.40 - 1.20 mg/dL 0.76 0.79 0.86  Sodium 135 - 145 mEq/L 142 139 140  Potassium 3.5 - 5.1 mEq/L 4.0 3.7 3.6  Chloride 96 - 112 mEq/L 105 103 100  CO2 19 - 32 mEq/L _1 Calcium 8.4 - 10.5 mg/dL 9.5 8.9 9.1  Total Protein 6.0 - 8.3 g/dL 7.4 6.8 -  Total Bilirubin 0.2 - 1.2 mg/dL 0.4 0.8 -  Alkaline Phos 39 - 117 U/L 95 84 -  AST 0 - 37 U/L 16 19 -  ALT 0 - 35 U/L 13 14 -    Assessment: Encounter Diagnoses  Name Primary?  . Generalized abdominal pain Yes  . Esophageal dysphagia   . Chronic constipation   . Abdominal bloating   . Screening for viral disease   . Normocytic anemia     Worsening of chronic constipation with development of generalized abdominal pain, bloating and excess gas production.  Possibly worsening of diverticulosis with associated sigmoid tortuosity, possible pelvic floor dysfunction, less likely malignancy.  This anemia is not clearly iron deficiency, and would benefit from further primary care evaluation.  It is not clear that iron is indicated for her and it certainly worsens her constipation.  Plan:  Half bottle magnesium citrate, then start MiraLAX 1 capful twice a day.  The magnesium citrate can be done weekly if needed. Colonoscopy.  She is agreeable after discussion of procedure and risks.  The benefits and risks of the planned procedure were described in detail with the patient or (when appropriate) their health care proxy.  Risks were outlined as including, but not limited to, bleeding, infection, perforation, adverse medication reaction leading to cardiac or pulmonary decompensation, pancreatitis (if ERCP).   The limitation of incomplete mucosal visualization was also discussed.  No guarantees or warranties were given.  Patient at increased risk for cardiopulmonary complications of procedure due to medical comorbidities.  She will need to be off anticoagulation 2 days prior, and we will review this with her cardiologist. She understands the small but real risk of stroke while off anticoagulation.  Refer back to primary care for anemia evaluation.  If she is found to be iron deficient, IV iron would be more effective and better tolerated for her than oral therapy.  Thank you for the courtesy of this consult.  Please call me with any questions or concerns.  Nelida Meuse III  CC: Referring provider noted above

## 2019-08-26 NOTE — Telephone Encounter (Signed)
Patient with diagnosis of Atrial fibrillation on Xarelto for anticoagulation.    Procedure: Colonoscopy/endoscopy Date of procedure:   CHADS2-VASc score of 3  (HTN, AGE,female)  CrCl = 83 Platelet count = 314  Per office protocol, patient can hold Xarelto for 2 days prior to procedure.  Will not need bridging with Lovenox (enoxaparin) around procedure.

## 2019-08-26 NOTE — Telephone Encounter (Signed)
[facility] Pre-operative Risk Assessment     Request for surgical clearance:     Endoscopy Procedure  What type of surgery is being performed?     EGD/Colonoscopy  When is this surgery scheduled?     Tuesday 09/16/19  What type of clearance is required ?   Pharmacy  Are there any medications that need to be held prior to surgery and how long? Xarelto for 2 days prior to procedure.  Practice name and name of physician performing surgery?      [facility]  What is your office phone and fax number?      Phone- [phone]  Fax705 [phone]  Anesthesia type (None, local, MAC, general) ?       MAC

## 2019-08-26 NOTE — Telephone Encounter (Signed)
   Primary Cardiologist: Will Meredith Leeds, MD  Chart reviewed as part of pre-operative protocol coverage. Given past medical history and time since last visit, based on ACC/AHA guidelines, Kaysi Joson would be at acceptable risk for the planned procedure without further cardiovascular testing.   Patient with diagnosis of Atrial fibrillation on Xarelto for anticoagulation.    Procedure: Colonoscopy/endoscopy Date of procedure:   CHADS2-VASc score of 3  (HTN, AGE,female)  CrCl = 83 Platelet count = 314  Per office protocol, patient can hold Xarelto for 2 days prior to procedure.  Will not need bridging with Lovenox (enoxaparin) around procedure  I will route this recommendation to the requesting party via [facility] fax function and remove from pre-op pool.  Please call with questions.  Kathyrn Drown, NP 08/26/2019, 5:00 PM

## 2019-08-27 NOTE — Telephone Encounter (Signed)
Patient informed that she is able to hold her Xarelto. Patient voiced understanding.

## 2019-08-28 DIAGNOSIS — Z803 Family history of malignant neoplasm of breast: Secondary | ICD-10-CM | POA: Diagnosis not present

## 2019-08-28 DIAGNOSIS — Z1231 Encounter for screening mammogram for malignant neoplasm of breast: Secondary | ICD-10-CM | POA: Diagnosis not present

## 2019-08-28 LAB — HM MAMMOGRAPHY

## 2019-09-02 ENCOUNTER — Encounter: Payer: Self-pay | Admitting: Internal Medicine

## 2019-09-04 DIAGNOSIS — M5412 Radiculopathy, cervical region: Secondary | ICD-10-CM | POA: Diagnosis not present

## 2019-09-04 DIAGNOSIS — I10 Essential (primary) hypertension: Secondary | ICD-10-CM | POA: Diagnosis not present

## 2019-09-12 ENCOUNTER — Other Ambulatory Visit: Payer: Self-pay | Admitting: Gastroenterology

## 2019-09-12 ENCOUNTER — Ambulatory Visit (INDEPENDENT_AMBULATORY_CARE_PROVIDER_SITE_OTHER): Payer: Medicare Other

## 2019-09-12 DIAGNOSIS — Z1159 Encounter for screening for other viral diseases: Secondary | ICD-10-CM

## 2019-09-12 LAB — SARS CORONAVIRUS 2 (TAT 6-24 HRS): SARS Coronavirus 2: NEGATIVE

## 2019-09-16 ENCOUNTER — Encounter: Payer: Self-pay | Admitting: Gastroenterology

## 2019-09-16 ENCOUNTER — Other Ambulatory Visit: Payer: Self-pay

## 2019-09-16 ENCOUNTER — Ambulatory Visit (AMBULATORY_SURGERY_CENTER): Payer: Medicare Other | Admitting: Gastroenterology

## 2019-09-16 VITALS — BP 158/65 | HR 61 | Temp 98.4°F | Resp 19

## 2019-09-16 DIAGNOSIS — D122 Benign neoplasm of ascending colon: Secondary | ICD-10-CM | POA: Diagnosis not present

## 2019-09-16 DIAGNOSIS — R109 Unspecified abdominal pain: Secondary | ICD-10-CM | POA: Diagnosis not present

## 2019-09-16 DIAGNOSIS — K5730 Diverticulosis of large intestine without perforation or abscess without bleeding: Secondary | ICD-10-CM

## 2019-09-16 DIAGNOSIS — I2510 Atherosclerotic heart disease of native coronary artery without angina pectoris: Secondary | ICD-10-CM | POA: Diagnosis not present

## 2019-09-16 DIAGNOSIS — K5900 Constipation, unspecified: Secondary | ICD-10-CM | POA: Diagnosis not present

## 2019-09-16 DIAGNOSIS — I252 Old myocardial infarction: Secondary | ICD-10-CM | POA: Diagnosis not present

## 2019-09-16 DIAGNOSIS — K5909 Other constipation: Secondary | ICD-10-CM

## 2019-09-16 DIAGNOSIS — I4891 Unspecified atrial fibrillation: Secondary | ICD-10-CM | POA: Diagnosis not present

## 2019-09-16 DIAGNOSIS — R1319 Other dysphagia: Secondary | ICD-10-CM

## 2019-09-16 DIAGNOSIS — I10 Essential (primary) hypertension: Secondary | ICD-10-CM | POA: Diagnosis not present

## 2019-09-16 DIAGNOSIS — R1084 Generalized abdominal pain: Secondary | ICD-10-CM | POA: Diagnosis not present

## 2019-09-16 DIAGNOSIS — R1310 Dysphagia, unspecified: Secondary | ICD-10-CM

## 2019-09-16 MED ORDER — SODIUM CHLORIDE 0.9 % IV SOLN: 500.0000 mL | Freq: Once | INTRAVENOUS | Status: DC

## 2019-09-16 NOTE — Progress Notes (Signed)
Called to room to assist during endoscopic procedure.  Patient ID and intended procedure confirmed with present staff. Received instructions for my participation in the procedure from the performing physician.  

## 2019-09-16 NOTE — Progress Notes (Signed)
Report given to PACU, vss 

## 2019-09-16 NOTE — Op Note (Signed)
[facility] Patient Name: Samantha Stevenson Procedure Date: 09/16/2019 3:19 PM MRN: [id number] Endoscopist: [place]. Loletha Carrow , MD Age: 68 Referring MD:  Date of Birth: November 11, 1950 Gender: Female Account #: [id number] Procedure:                Colonoscopy Indications:              Generalized abdominal pain, Constipation (slowly                            worsening) Medicines:                Monitored Anesthesia Care Procedure:                Pre-Anesthesia Assessment:                           - Prior to the procedure, a History and Physical                            was performed, and patient medications and                            allergies were reviewed. The patient's tolerance of                            previous anesthesia was also reviewed. The risks                            and benefits of the procedure and the sedation                            options and risks were discussed with the patient.                            All questions were answered, and informed consent                            was obtained. Prior Anticoagulants: The patient has                            taken Xarelto (rivaroxaban), last dose was 2 days                            prior to procedure. ASA Grade Assessment: III - A                            patient with severe systemic disease. After                            reviewing the risks and benefits, the patient was                            deemed in satisfactory condition to undergo the  procedure.                           After obtaining informed consent, the colonoscope                            was passed under direct vision. Throughout the                            procedure, the patient's blood pressure, pulse, and                            oxygen saturations were monitored continuously. The                            Colonoscope was introduced through the anus and   advanced to the the cecum, identified by                            appendiceal orifice and ileocecal valve. The                            colonoscopy was performed with difficulty due to                            multiple diverticula in the colon, a redundant                            colon and a tortuous colon. Successful completion                            of the procedure was aided by using manual pressure                            and water inflation. Scope In: 3:24:38 PM Scope Out: 3:43:32 PM Scope Withdrawal Time: 0 hours 13 minutes 12 seconds  Total Procedure Duration: 0 hours 18 minutes 54 seconds  Findings:                 The perianal and digital rectal examinations were                            normal.                           A 8 mm polyp was found in the ascending colon. The                            polyp was pedunculated. The polyp was removed with                            a hot snare. Resection and retrieval were complete.                           Many diverticula were found in the entire colon.  Retroflexion in the rectum was not performed due to                            narrow anatomy.                           The exam was otherwise without abnormality. Complications:            No immediate complications. Estimated Blood Loss:     Estimated blood loss: none. Impression:               - One 8 mm polyp in the ascending colon, removed                            with a hot snare. Resected and retrieved.                           - Diverticulosis in the entire examined colon.                           - The examination was otherwise normal. Recommendation:           - Patient has a contact number available for                            emergencies. The signs and symptoms of potential                            delayed complications were discussed with the                            patient. Return to normal activities tomorrow.                             Written discharge instructions were provided to the                            patient.                           - Resume previous diet.                           - Continue present medications.                           - Await pathology results.                           - Repeat colonoscopy is recommended for                            surveillance. The colonoscopy date will be                            determined after pathology results from today's  exam become available for review.                           - Resume Xarelto (rivaroxaban) at prior dose in 2                            days.                           - Recommend gynecologic evaluation for pelvic exam                            and consideration of rectocele.                           - See the other procedure note for documentation of                            additional recommendations. Layliana Devins L. Loletha Carrow, MD 09/16/2019 3:59:55 PM This report has been signed electronically.

## 2019-09-16 NOTE — Progress Notes (Signed)
[name] Temperature- [name]   

## 2019-09-16 NOTE — Op Note (Addendum)
[facility] Patient Name: Samantha Stevenson Procedure Date: 09/16/2019 3:18 PM MRN: [id number] Endoscopist: [place]. Loletha Carrow , MD Age: 68 Referring MD:  Date of Birth: 1951/08/06 Gender: Female Account #: [id number] Procedure:                Upper GI endoscopy Indications:              Esophageal dysphagia Medicines:                Monitored Anesthesia Care Procedure:                Pre-Anesthesia Assessment:                           - Prior to the procedure, a History and Physical                            was performed, and patient medications and                            allergies were reviewed. The patient's tolerance of                            previous anesthesia was also reviewed. The risks                            and benefits of the procedure and the sedation                            options and risks were discussed with the patient.                            All questions were answered, and informed consent                            was obtained. Prior Anticoagulants: The patient has                            taken Xarelto (rivaroxaban), last dose was 2 days                            prior to procedure. ASA Grade Assessment: III - A                            patient with severe systemic disease. After                            reviewing the risks and benefits, the patient was                            deemed in satisfactory condition to undergo the                            procedure.  After obtaining informed consent, the endoscope was                            passed under direct vision. Throughout the                            procedure, the patient's blood pressure, pulse, and                            oxygen saturations were monitored continuously. The                            Endoscope was introduced through the mouth, and                            advanced to the second part of duodenum. The upper                 GI endoscopy was accomplished without difficulty.                            The patient tolerated the procedure well. Scope In: Scope Out: Findings:                 The larynx was normal.                           The lower third of the esophagus was mildly                            tortuous. Esophagus otherwise normal. No resistance                            passing scope through EGJ, and no stricture seen.                           The stomach was normal.                           The cardia and gastric fundus were normal on                            retroflexion.                           The examined duodenum was normal. Complications:            No immediate complications. Estimated Blood Loss:     Estimated blood loss: none. Impression:               - Normal larynx.                           - Tortuous esophagus.                           - Normal stomach.                           -  Normal examined duodenum.                           - No specimens collected. Recommendation:           - Patient has a contact number available for                            emergencies. The signs and symptoms of potential                            delayed complications were discussed with the                            patient. Return to normal activities tomorrow.                            Written discharge instructions were provided to the                            patient.                           - Resume previous diet.                           - Resume Xarelto (rivaroxaban) at prior dose in 2                            days.                           - See the other procedure note for documentation of                            additional recommendations. Andrzej Scully L. Loletha Carrow, MD 09/16/2019 4:03:59 PM This report has been signed electronically.

## 2019-09-16 NOTE — Patient Instructions (Addendum)
RESUME XARELTO AT PRIOR DOSE IN 2 DAYS. ONE POLYP REMOVED TODAY. MANY DIVERTICULI NOTED (DIVERTICULOSIS) RECOMMEND GYNELOGICAL EVALUATION FOR PELVIC EXAM AND CONSIDERATION OF RECTOCELE.        YOU HAD AN ENDOSCOPIC PROCEDURE TODAY AT [facility]:   Refer to the procedure report that was given to you for any specific questions about what was found during the examination.  If the procedure report does not answer your questions, please call your gastroenterologist to clarify.  If you requested that your care partner not be given the details of your procedure findings, then the procedure report has been included in a sealed envelope for you to review at your convenience later.  YOU SHOULD EXPECT: Some feelings of bloating in the abdomen. Passage of more gas than usual.  Walking can help get rid of the air that was put into your GI tract during the procedure and reduce the bloating. If you had a lower endoscopy (such as a colonoscopy or flexible sigmoidoscopy) you may notice spotting of blood in your stool or on the toilet paper. If you underwent a bowel prep for your procedure, you may not have a normal bowel movement for a few days.  Please Note:  You might notice some irritation and congestion in your nose or some drainage.  This is from the oxygen used during your procedure.  There is no need for concern and it should clear up in a day or so.  SYMPTOMS TO REPORT IMMEDIATELY:   Following lower endoscopy (colonoscopy or flexible sigmoidoscopy):  Excessive amounts of blood in the stool  Significant tenderness or worsening of abdominal pains  Swelling of the abdomen that is new, acute  Fever of 100F or higher   Following upper endoscopy (EGD)  Vomiting of blood or coffee ground material  New chest pain or pain under the shoulder blades  Painful or persistently difficult swallowing  New shortness of breath  Fever of 100F or higher  Black, tarry-looking stools  For  urgent or emergent issues, a gastroenterologist can be reached at any hour by calling [phone].   DIET:  We do recommend a small meal at first, but then you may proceed to your regular diet.  Drink plenty of fluids but you should avoid alcoholic beverages for 24 hours.  ACTIVITY:  You should plan to take it easy for the rest of today and you should NOT DRIVE or use heavy machinery until tomorrow (because of the sedation medicines used during the test).    FOLLOW UP: Our staff will call the number listed on your records 48-72 hours following your procedure to check on you and address any questions or concerns that you may have regarding the information given to you following your procedure. If we do not reach you, we will leave a message.  We will attempt to reach you two times.  During this call, we will ask if you have developed any symptoms of COVID 19. If you develop any symptoms (ie: fever, flu-like symptoms, shortness of breath, cough etc.) before then, please call [phone].  If you test positive for Covid 19 in the 2 weeks post procedure, please call and report this information to [place].    If any biopsies were taken you will be contacted by phone or by letter within the next 1-3 weeks.  Please call us at [phone] if you have not heard about the biopsies in 3 weeks.    SIGNATURES/CONFIDENTIALITY: You and/or your care partner have  signed paperwork which will be entered into your electronic medical record.  These signatures attest to the fact that that the information above on your After Visit Summary has been reviewed and is understood.  Full responsibility of the confidentiality of this discharge information lies with you and/or your care-partner.

## 2019-09-22 ENCOUNTER — Telehealth: Payer: Self-pay

## 2019-09-22 ENCOUNTER — Other Ambulatory Visit: Payer: Self-pay | Admitting: Cardiology

## 2019-09-22 ENCOUNTER — Other Ambulatory Visit: Payer: Self-pay

## 2019-09-22 MED ORDER — RIVAROXABAN 20 MG PO TABS: ORAL_TABLET | ORAL | 0 refills | Status: DC

## 2019-09-22 NOTE — Telephone Encounter (Signed)
Patient informed that she does not need a referral for OBGYN

## 2019-09-22 NOTE — Telephone Encounter (Signed)
°*  STAT* If patient is at the pharmacy, call can be transferred to refill team.   1. Which medications need to be refilled? (please list name of each medication and dose if known) XARELTO 20 MG TABS tablet  2. Which pharmacy/location (including street and city if local pharmacy) is medication to be sent to? [facility] [phone] - [place], [place] - [address] AT [place]  3. Do they need a 30 day or 90 day supply? [place]

## 2019-09-22 NOTE — Telephone Encounter (Signed)
Copied from [place] [id number]. Topic: General - Inquiry >> Sep 22, 2019  1:47 PM [facility] wrote: Reason for CRM: Patient is wanting to see an OBGYN . Patient wasn't sure if she needed to have a referral

## 2019-09-22 NOTE — Telephone Encounter (Signed)
  Follow up Call-  Call back number 09/16/2019  Post procedure Call Back phone  # [phone]  Permission to leave phone message Yes  Some recent data might be hidden     Patient questions:  Do you have a fever, pain , or abdominal swelling? No. Pain Score  0 *  Have you tolerated food without any problems? Yes.    Have you been able to return to your normal activities? Yes.    Do you have any questions about your discharge instructions: Diet   No. Medications  No. Follow up visit  No.  Do you have questions or concerns about your Care? No.  Actions: * If pain score is 4 or above: 1. No action needed, pain <4.Have you developed a fever since your procedure? no  2.   Have you had an respiratory symptoms (SOB or cough) since your procedure? no  3.   Have you tested positive for COVID 19 since your procedure no  4.   Have you had any family members/close contacts diagnosed with the COVID 19 since your procedure?  no   If yes to any of these questions please route to Joylene John, RN and Alphonsa Gin, Therapist, sports.

## 2019-09-22 NOTE — Telephone Encounter (Signed)
Medication refill sent, see refill encounter from 09/22/2019

## 2019-09-22 NOTE — Telephone Encounter (Addendum)
Xarelto 20mg  refill request received. Pt is 68 years old, weight-72.4kg, Crea-0.76 on 05/16/2019, last seen by Dr. Curt Bears on 02/27/2018-needs an appt, Diagnosis-Afib, CrCl-83.18ml/min; Dose is appropriate based on dosing criteria. Will send in refill to requested pharmacy.   Pt is overdue to see Cardiologist, will call the pt so she can schedule an appt. Called pt and advised her that she is overdue for her Cardiology appt and she will need to schedule an appt. She stated she has a lot of appts coming up and stated to go ahead and transfer her to make an appt. She is aware taht once appt is made I will send in her a refill. Will await and check for an appt.  Pt has an appt scheduled for 10/07/2019 with Tommye Standard. Will send in a refill at this time.

## 2019-09-23 ENCOUNTER — Encounter: Payer: Self-pay | Admitting: Gastroenterology

## 2019-09-23 DIAGNOSIS — R310 Gross hematuria: Secondary | ICD-10-CM | POA: Diagnosis not present

## 2019-09-25 DIAGNOSIS — R310 Gross hematuria: Secondary | ICD-10-CM | POA: Diagnosis not present

## 2019-09-25 DIAGNOSIS — M4802 Spinal stenosis, cervical region: Secondary | ICD-10-CM | POA: Diagnosis not present

## 2019-09-29 ENCOUNTER — Telehealth: Payer: Self-pay | Admitting: *Deleted

## 2019-09-29 NOTE — Telephone Encounter (Signed)
Pharmacy can you comment on holding Xarelto pre op in this patient, then I will contact the patient.  Kerin Ransom PA-C 09/29/2019 4:01 PM

## 2019-09-29 NOTE — Telephone Encounter (Signed)
[facility] takes Xarelto for afib with CHADS2VASc score of 5 (age, sex, HTN, provoked PE post op in 2014 treated with anticoagulation for 6 months). She saw [facility] once in 2017 and [facility] and myself in 2019 but has not been seen by a [place] aside from single 2017 visit. May need follow up to assess for any other comorbidities.  Renal function stable, Hgb a bit low at 10.4 in July 2020. With current risk factors, would be ok to hold Xarelto for 3 days prior to spinal procedure as requested since VTE was provoked and treated prior to [facility] starting on Xarelto for anticoag secondary to afib.

## 2019-09-29 NOTE — Telephone Encounter (Signed)
   [facility] Pre-operative Risk Assessment    Request for surgical clearance:  1. What type of surgery is being performed? C3-4, C4-5, C5-6, C6-7 ANTERIOR CERVICAL FUSION   2. When is this surgery scheduled? TBD   3. What type of clearance is required (medical clearance vs. Pharmacy clearance to hold med vs. Both)? BOTH  4. Are there any medications that need to be held prior to surgery and how long? [place]   5. Practice name and name of physician performing surgery? [facility]; DR. Mallie Mussel POOL  6. What is your office phone number [phone]    7.   What is your office fax number [phone]  8.   Anesthesia type (None, local, MAC, general) ? GENERAL    Julaine Hua 09/29/2019, 3:32 PM  _________________________________________________________________   (provider comments below)

## 2019-09-30 DIAGNOSIS — R310 Gross hematuria: Secondary | ICD-10-CM | POA: Diagnosis not present

## 2019-09-30 DIAGNOSIS — N362 Urethral caruncle: Secondary | ICD-10-CM | POA: Diagnosis not present

## 2019-09-30 NOTE — Telephone Encounter (Signed)
Patient has upcoming appt on 10/07/2019 with Tommye Standard.

## 2019-09-30 NOTE — Telephone Encounter (Signed)
   Primary [place], MD  Chart reviewed as part of pre-operative protocol coverage. Because of [facility]'s past medical history and time since last visit, he/she will require a follow-up visit in order to better assess preoperative cardiovascular risk.  This patient hasn't been seen by cardiology in more than a year.  Both medical and pharmacy clearance was requested- pharmacy recommendations are outlined.   Pre-op covering staff: - Please schedule appointment and call patient to inform them. - Please contact requesting surgeon's office via preferred method (i.e, phone, fax) to inform them of need for appointment prior to surgery.  If applicable, this message will also be routed to pharmacy pool and/or primary cardiologist for input on holding anticoagulant/antiplatelet agent as requested below so that this information is available at time of patient's appointment.   Kerin Ransom, PA-C  09/30/2019, 11:00 AM

## 2019-10-05 NOTE — Progress Notes (Signed)
Cardiology Office Note Date:  10/07/2019  Patient ID:  Carliana, Leinweber 1950/12/29, MRN [id number] PCP:  Hoyt Koch, MD  Electrophysiologist:  Dr. Curt Bears   Chief Complaint: pre-op  History of Present Illness: Maysel Mccarron is a 68 y.o. 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   She comes in today to be seen for Dr. Curt Bears, last seen by him in 2017, at that time was referred to him for her Afib.  He mentions though having no records to review that actually showed her AFib, and that attempts to get them would be made, that she may benefit from a/c in the future if she does have AF.  She was c/o of some SOB and planned for an echo.  Echo then noted LVEF 55%, no WMA, no significant VHD, findings of elevated LVEDP and LA filling pressure.  She was not seen again until May 2019 by [facility], seeing [name], PA for surgical clearance prior to a thumb surgery.  She noted records obtained 2014 had EKGs both SR and pt declined a/c.  She was treated 6 mo in 2014 for PE. At the time of her visit the pt felt well, had visited [place] walked much without difficulty, had chrinic LE edema managed with support stockings and lasix.  Palpitations controlled with her dilt and dig well, though mentiuoned when not taking them would have Afib. She was cleared for her surgery, also noted the patient had become agreeable for a/c and planned to start Xarelto 2 days post op and have [place] follow up afterwards.  August 2019, she developed CP, went to [facility], given h/o PE had CT chest done, noting  an acute intramural hematoma of the thoracic aorta extending from the left subclavian artery to the renal artery origins, her BP brought under strict control and her xarelto reversed  CTS and VVS evaluated noted with type B aortic intramural  hematoma  06/13/2018 1.  Left subclavian to carotid artery transposition 2.  Percutaneous access right common femoral artery 3.  Thoracic endovascular endo-grafting with left subclavian artery coverage with 28 x 28 x 15 cm distal and 34 x 34 x 20 cm proximal Gore CTAG 4.  Left common carotid artery stent with 7 x 40 mm Innova distal an 8 x 40 mm Innova proximally 5.  Right common femoral endarterectomy with greater saphenous vein patch angioplasty 6.  Intravascular ultrasound thoracic aorta.  developed tachycardia/hypotension found with lymphatic leak  06/16/2018 1.  Exploration left neck wound placement of 10 flat drain 2.  Left sided 28 French chest tube placement  TTE done during this stay noted LVEF 55-60%, no WMA, grade I DD, There was evidence of aortic dissection flap noted   in the descending thoracic aorta (0.37cm flap posterior). Seen on   CT scan. No significant VHD.  She last saw Dr. Donzetta Matters 05/16/2019 doing well from her surgical perspective.  There was mention of some hematuria that she had apparently had in thepast as well.  He mentioned to discuss wether she needed the Xarelto (noting on it for DVT/PE history , no mention of Afib hx), and asked the pt to f/u with her PMD, but did say she needed lifeling ASA therapy for her stents.  She was stable from a vascular perspective in review of her CT scan and planned for annual  visit with him.  Looks like she saw her PMD Sept 2020 for constipation, neck pain and discussion about wanting to stop her xarelto.  She was recommended to discuss a/c with [place], noting CHA2DS2Vasc score of 5 and she recommended lifelong a/c   She comes today to be seen for Dr. Curt Bears, now needing cardiac clearance for her pending C3-4, C4-5, C5-6, C6-7 ANTERIOR CERVICAL FUSION, not yet scheduled, planned for general anesthesia.   She has been cleared by [facility] for 3 days off her xarelto.   Her Afib hx is unclear, problem list reports she had MI associated with  Afib uin 2000, Epic notes reviewed as far back as I could go, problem list in 2014 mentions afib goes back to age 39 managed with medicines, no helpful or other information in care everywhere.   She reports no palpitations or symptoms of her AFib for a few years.  Says that she was diagnosed with AFib at age 37 and has had palpitations all her life though not until her adult years did it become symptomatic and reports back in 2000 having a heart attack attribute to her AFib.  Since being put on her current regime years ago, it has been well controlled.  She has significant pain with her C-spine disease, neck, shoulder and L arm, and in the last few weeks R posterior arm as well, with some swelling.  She denies any missed doses of her xarelto.  In discussion of her exertional capacity/estimate of her METS, she describes fairly significant exertional incapacities, she avoids stairs these make her SOB, mentions prior to her procedures, hospital stay above Aug 2019 she would walk her dog every day, but since then just has not had the energy, and as time has passed feels she gets winded, SOB with minimal exertion.  She gives an example that when shopping with her daughters she will sit while they brouwse around.  She says some day she could probably walk a block, but generally does not think she could. She doesn't vacuume or sweep because these make her SOB  She mentions a new CP in the last 2 weeks, she says she thinks it is the way she sleeps at night that makes her chest hurt, maybe laying on her L side, but then says some times it is a strong ache during the day that worries her, but then passes it off to musculoskeletal, "but I am not a doctor, so I don't know"  No near syncope or syncope.  She says when she was on high dose ASA with her Xarelto she had hematuria, though on low dose less.  She saw her urologist last week has follow up next week.  She has not been told to stop her xarelto, and hematuria is  occasional and not new.  No other bleeding or signs of bleeding.  mantions when she was on Plavix and xarelto she had rectal bleeding at times.   RCRI score is zero, 0.4% risk (low risk)   Past Medical History:  Diagnosis Date  . Allergy   . Anemia   . Arthritis    "qwhere" (07/29/2018)  . Atrial fibrillation ([place])   . Clotting disorder ([place])   . Degenerative arthritis of hip    s/p L THR 12/2012  . Dyspnea    since surgery in August 2019- "when I get worked up and walk a short distance"  . Dysrhythmia   . History of blood transfusion 05/2018   "related to OR"  .  Hyperlipidemia   . Hypertension   . Migraines    mIgraines- none since blood pressure and lipids are under control  . Myocardial infarction ([place]) 2000   due to atrial fib  . Neuromuscular disorder ([place])    pinched nerve- left side of neck  . Pulmonary emboli ([place]) 12/2012 dx   post op (L THR), anticoag x 81mo    Past Surgical History:  Procedure Laterality Date  . ABDOMINAL HYSTERECTOMY     "w/1 tube"  . ARTERY REPAIR Left 08/15/2018   Procedure: LEFT BRACHIAL ARTERY EXPLORATION;  Surgeon: Waynetta Sandy, MD;  Location: [facility];  Service: Vascular;  Laterality: Left;  . Breast Ultrasound Left 04/16/13   Done @ breast center Impression: no malignancy appearance noted on the screen study is consistent with a summation shadow  . CARDIAC CATHETERIZATION    . CAROTID-SUBCLAVIAN BYPASS GRAFT Left 06/13/2018   Procedure: BYPASS GRAFT CAROTID-SUBCLAVIAN;  Surgeon: Waynetta Sandy, MD;  Location: [facility];  Service: Vascular;  Laterality: Left;  . COLONOSCOPY    . DG TUMB RIGHT HAND Right    cyst removal  . IR THORACENTESIS ASP PLEURAL SPACE W/IMG GUIDE  05/31/2018  . THORACIC AORTIC ENDOVASCULAR STENT GRAFT Left 06/13/2018   Procedure: LEFT  SUBCLAVIAN TO CAROTID ARTERY TRANSPOSITION; THORACIC AORTIC ENDOVASCULAR STENT GRAFT using GORE CONFORMABLE THORACIC STENT GRAFT AND GORE TAG THORACIC ENDOPROSTHESIS;  STENT LEFT COMMON CAROTID ARTERY, RIGHT COMMON-FEMORAL ENDARTERECTOMY WITH PATCH ANGIOPLASTY;  Surgeon: Waynetta Sandy, MD;  Location: [facility];  Service: Vascular;  Laterality: Left;  . TONSILLECTOMY    . TOTAL HIP ARTHROPLASTY Left 01/03/2013   Procedure: TOTAL HIP ARTHROPLASTY ANTERIOR APPROACH;  Surgeon: Mcarthur Rossetti, MD;  Location: [facility];  Service: Orthopedics;  Laterality: Left;  Left Total Hip Arthroplasty, Anterior Approach  . TUBAL LIGATION    . VAGINA RECONSTRUCTION SURGERY  1966   vagina had closed up when 67 years old  . WOUND EXPLORATION Left 06/16/2018   Procedure: LEFT NECK WOUND EXPLORATION, CHEST TUBE INSERTION;  Surgeon: Waynetta Sandy, MD;  Location: [facility];  Service: Vascular;  Laterality: Left;    Current Outpatient Medications  Medication Sig Dispense Refill  . acetaminophen (TYLENOL) 500 MG tablet Take 1,000 mg by mouth as needed for moderate pain.     Marland Kitchen digoxin (LANOXIN) 0.25 MG tablet Take 1 tablet (250 mcg total) by mouth every morning. 90 tablet 1  . diltiazem (DILT-XR) 240 MG 24 hr capsule Take 1 capsule (240 mg total) by mouth every morning. 90 capsule 1  . ferrous sulfate 325 (65 FE) MG tablet Take 325 mg by mouth once a week.     . furosemide (LASIX) 20 MG tablet Take 20 mg by mouth daily as needed for fluid.     . Multiple Vitamin (MULTIVITAMIN) tablet Take 1 tablet by mouth daily.    . rivaroxaban (XARELTO) 20 MG TABS tablet TAKE 1 TABLET(20 MG) BY MOUTH DAILY WITH SUPPER 90 tablet 0  . rosuvastatin (CRESTOR) 20 MG tablet Take 1 tablet (20 mg total) by mouth daily. 90 tablet 1   No current facility-administered medications for this visit.    Allergies:   Penicillins, Latex, Banana, Asa [aspirin], Celebrex [celecoxib], and Nsaids   Social History:  The patient  reports that she has never smoked. She has never used smokeless tobacco. She reports previous alcohol use. She reports that she does not use drugs.   Family History:  The  patient's family history includes Alcohol abuse  in an other family member; Breast cancer in an other family member; Diabetes in her mother, sister, and another family member; Heart attack (age of onset: 41) in her mother; Heart disease in her father, mother, and another family member; Hyperlipidemia in an other family member; Hypertension in an other family member.  ROS:  Please see the history of present illness.  All other systems are reviewed and otherwise negative.   PHYSICAL EXAM:  VS:  BP 136/78   Pulse 66   Ht 5\' 4"  (1.626 m)   Wt 163 lb (73.9 kg)   BMI 27.98 kg/m  BMI: Body mass index is 27.98 kg/m. Well nourished, well developed, in no acute distress  HEENT: normocephalic, atraumatic  Neck: no JVD, carotid bruits or masses Cardiac: RRR; no significant murmurs, no rubs, or gallops Lungs:  CTA b/l , no wheezing, rhonchi or rales  Abd: soft, nontender MS: no deformity or atrophy Ext: no edema, RUE arm appears slightly bigger then the left though not edematous, no skin changes, not markedly different, she has excellent pulses b/l UE  Skin: warm and dry, no rash Neuro:  No gross deficits appreciated Psych: euthymic mood, full affect    EKG:  Done today and reviewed by myself shows SR 66bppm,  LAS, unchanged from prior   05/28/2018: TTE Study Conclusions - HPI and indications: 441.01 Dissection of Aorta , Thoracic. - Left ventricle: The cavity size was normal. Systolic function was   normal. The estimated ejection fraction was in the range of 55%   to 60%. Wall motion was normal; there were no regional wall   motion abnormalities. Doppler parameters are consistent with   abnormal left ventricular relaxation (grade 1 diastolic   dysfunction). - Aortic valve: Trileaflet; mildly thickened leaflets. There was no   regurgitation. - Aortic root: There was evidence of aortic dissection flap noted   in the descending thoracic aorta (0.37cm flap posterior). Seen on   CT scan. -  Mitral valve: Calcified annulus. - Tricuspid valve: There was mild regurgitation. - Pulmonary arteries: Systolic pressure was mildly increased. PA   peak pressure: 36 mm Hg (S). - Pericardium, extracardiac: A trivial pericardial effusion was   identified posterior to the heart   2D echo 07/19/2016 Study Conclusions - Left ventricle: Distal septal hypokinesis. The cavity size was normal. Systolic function was normal. The estimated ejection fraction was 55%. Wall motion was normal; there were no regional wall motion abnormalities. Doppler parameters are consistent with both elevated ventricular end-diastolic filling pressure and elevated left atrial filling pressure. - Atrial septum: No defect or patent foramen ovale was identified. - Pericardium, extracardiac: A trivial pericardial effusion was identified posterior to the heart  Recent Labs: 10/09/2018: B Natriuretic Peptide 21.6 05/16/2019: ALT 13; BUN 17; Creatinine, Ser 0.76; Hemoglobin 10.4; Platelets 314.0; Potassium 4.0; Sodium 142  No results found for requested labs within last 8760 hours.   CrCl cannot be calculated (Patient's most recent lab result is older than the maximum 21 days allowed.).   Wt Readings from Last 3 Encounters:  10/07/19 163 lb (73.9 kg)  08/26/19 159 lb 9.6 oz (72.4 kg)  07/17/19 160 lb 9.6 oz (72.8 kg)     Other studies reviewed: Additional studies/records reviewed today include: summarized above  ASSESSMENT AND PLAN:  1. Atrial fibrillation     CHA2DS2Vasc is 5 (including gender)      H/o provoked DVT/PE (post hip surgery)     Burden is low by lack of symptoms  I  don't think we have any formal record of her Afib though she relates a clear and known longstanding diagnosis of this Some hematuria intermittently though  Not new  Dig level today  2. HTN     Looks OK, no changes  3. Pre-op     She c/o DOE and an unclear new CP  I suspect her DOE is deconditioning mostly, have a  very hard time getting a handle on her CP.  She is somewhat vague, and has a hard time describing it to me. I will have her echo updated and get a lexiscan stress test to help with surgical risk/cleanace   Disposition: F/u with 1 mo RTC, sooner if needed.  If her testing looks OK, we can push this out   Current medicines are reviewed at length with the patient today.  The patient did not have any concerns regarding medicines.  Venetia Night, PA-C 10/07/2019 11:41 AM     [facility] [place] [address] [phone] (office)  [phone] (fax)

## 2019-10-07 ENCOUNTER — Other Ambulatory Visit: Payer: Self-pay

## 2019-10-07 ENCOUNTER — Ambulatory Visit (INDEPENDENT_AMBULATORY_CARE_PROVIDER_SITE_OTHER): Payer: Medicare Other | Admitting: Physician Assistant

## 2019-10-07 VITALS — BP 136/78 | HR 66 | Ht 64.0 in | Wt 163.0 lb

## 2019-10-07 DIAGNOSIS — I4891 Unspecified atrial fibrillation: Secondary | ICD-10-CM | POA: Diagnosis not present

## 2019-10-07 DIAGNOSIS — Z01818 Encounter for other preprocedural examination: Secondary | ICD-10-CM

## 2019-10-07 DIAGNOSIS — R079 Chest pain, unspecified: Secondary | ICD-10-CM

## 2019-10-07 DIAGNOSIS — R0600 Dyspnea, unspecified: Secondary | ICD-10-CM

## 2019-10-07 NOTE — Patient Instructions (Signed)
Medication Instructions:   Your physician recommends that you continue on your current medications as directed. Please refer to the Current Medication list given to you today.  *If you need a refill on your cardiac medications before your next appointment, please call your pharmacy*  Lab Work:  DIGOXIN    If you have labs (blood work) drawn today and your tests are completely normal, you will receive your results only by: Marland Kitchen MyChart Message (if you have MyChart) OR . A paper copy in the mail If you have any lab test that is abnormal or we need to change your treatment, we will call you to review the results.  Testing/Procedures: .Your physician has requested that you have an echocardiogram. Echocardiography is a painless test that uses sound waves to create images of your heart. It provides your doctor with information about the size and shape of your heart and how well your heart's chambers and valves are working. This procedure takes approximately one hour. There are no restrictions for this procedure.  Your physician has requested that you have a lexiscan myoview. For further information please visit [email or web address]. Please follow instruction sheet, as given.  Follow-Up: At [facility], you and your health needs are our priority.  As part of our continuing mission to provide you with exceptional heart care, we have created designated Provider Care Teams.  These Care Teams include your primary Cardiologist (physician) and Advanced Practice Providers (APPs -  Physician Assistants and Nurse Practitioners) who all work together to provide you with the care you need, when you need it.  Your next appointment:   1 month(s)  The format for your next appointment:   In Person  Provider:  Tommye Standard, PA-C    Other Instructions

## 2019-10-08 ENCOUNTER — Other Ambulatory Visit: Payer: Self-pay | Admitting: *Deleted

## 2019-10-08 DIAGNOSIS — Z79899 Other long term (current) drug therapy: Secondary | ICD-10-CM

## 2019-10-08 LAB — DIGOXIN LEVEL: Digoxin, Serum: 1.2 ng/mL — ABNORMAL HIGH (ref 0.5–0.9)

## 2019-10-08 MED ORDER — DIGOXIN 250 MCG PO TABS: 0.1250 ug | ORAL_TABLET | Freq: Every morning | ORAL | 1 refills | Status: DC

## 2019-10-08 NOTE — Progress Notes (Addendum)
Spoke with patient about labs and verbalized understanding with reccomendations. Patient is to cut current dose  250 mcg Digoxin in half 0.125 mcg  and repeat lab work on 10-22-19. Patient will hold Digoxin the morning of lab work.

## 2019-10-14 ENCOUNTER — Telehealth (HOSPITAL_COMMUNITY): Payer: Self-pay | Admitting: *Deleted

## 2019-10-14 NOTE — Telephone Encounter (Signed)
Patient given detailed instructions per Myocardial Perfusion Study Information Sheet for the test on 10/20/19 at 10:15. Patient notified to arrive 15 minutes early and that it is imperative to arrive on time for appointment to keep from having the test rescheduled.  If you need to cancel or reschedule your appointment, please call the office within 24 hours of your appointment. . Patient verbalized understanding.Samantha Stevenson

## 2019-10-20 ENCOUNTER — Other Ambulatory Visit: Payer: Medicare Other | Admitting: *Deleted

## 2019-10-20 ENCOUNTER — Ambulatory Visit (HOSPITAL_BASED_OUTPATIENT_CLINIC_OR_DEPARTMENT_OTHER): Payer: Medicare Other

## 2019-10-20 ENCOUNTER — Other Ambulatory Visit: Payer: Self-pay

## 2019-10-20 ENCOUNTER — Ambulatory Visit (HOSPITAL_COMMUNITY): Payer: Medicare Other | Attending: Cardiovascular Disease

## 2019-10-20 DIAGNOSIS — R0600 Dyspnea, unspecified: Secondary | ICD-10-CM | POA: Diagnosis not present

## 2019-10-20 DIAGNOSIS — Z79899 Other long term (current) drug therapy: Secondary | ICD-10-CM

## 2019-10-20 DIAGNOSIS — R079 Chest pain, unspecified: Secondary | ICD-10-CM | POA: Insufficient documentation

## 2019-10-20 LAB — MYOCARDIAL PERFUSION IMAGING
LV dias vol: 70 mL (ref 46–106)
LV sys vol: 24 mL
Peak HR: 94 {beats}/min
Rest HR: 66 {beats}/min
SDS: 2
SRS: 2
SSS: 4
TID: 1.02

## 2019-10-20 MED ORDER — REGADENOSON 0.4 MG/5ML IV SOLN
0.4000 mg | Freq: Once | INTRAVENOUS | Status: AC
  Administered 2019-10-20: 0.4 mg via INTRAVENOUS

## 2019-10-20 MED ORDER — TECHNETIUM TC 99M TETROFOSMIN IV KIT
10.1000 | PACK | Freq: Once | INTRAVENOUS | Status: AC | PRN
  Administered 2019-10-20: 10.1 via INTRAVENOUS
  Filled 2019-10-20: qty 11

## 2019-10-20 MED ORDER — TECHNETIUM TC 99M TETROFOSMIN IV KIT
32.9000 | PACK | Freq: Once | INTRAVENOUS | Status: AC | PRN
  Administered 2019-10-20: 32.9 via INTRAVENOUS
  Filled 2019-10-20: qty 33

## 2019-10-21 ENCOUNTER — Other Ambulatory Visit: Payer: Self-pay | Admitting: *Deleted

## 2019-10-21 DIAGNOSIS — Z79899 Other long term (current) drug therapy: Secondary | ICD-10-CM

## 2019-10-21 LAB — DIGOXIN LEVEL: Digoxin, Serum: 0.5 ng/mL (ref 0.5–0.9)

## 2019-10-21 MED ORDER — FUROSEMIDE 20 MG PO TABS: 20.0000 mg | ORAL_TABLET | Freq: Every day | ORAL | 1 refills | Status: DC

## 2019-10-22 ENCOUNTER — Other Ambulatory Visit: Payer: Medicare Other

## 2019-10-30 ENCOUNTER — Other Ambulatory Visit: Payer: Medicare Other | Admitting: *Deleted

## 2019-10-30 ENCOUNTER — Other Ambulatory Visit: Payer: Self-pay

## 2019-10-30 DIAGNOSIS — Z79899 Other long term (current) drug therapy: Secondary | ICD-10-CM | POA: Diagnosis not present

## 2019-10-30 LAB — BASIC METABOLIC PANEL
BUN/Creatinine Ratio: 19 (ref 12–28)
BUN: 15 mg/dL (ref 8–27)
CO2: 25 mmol/L (ref 20–29)
Calcium: 9.1 mg/dL (ref 8.7–10.3)
Chloride: 100 mmol/L (ref 96–106)
Creatinine, Ser: 0.78 mg/dL (ref 0.57–1.00)
GFR calc Af Amer: 92 mL/min/{1.73_m2} (ref >59)
GFR calc non Af Amer: 79 mL/min/{1.73_m2} (ref >59)
Glucose: 95 mg/dL (ref 65–99)
Potassium: 3.8 mmol/L (ref 3.5–5.2)
Sodium: 138 mmol/L (ref 134–144)

## 2019-11-03 ENCOUNTER — Encounter: Payer: Self-pay | Admitting: Podiatry

## 2019-11-03 ENCOUNTER — Other Ambulatory Visit: Payer: Self-pay | Admitting: Podiatry

## 2019-11-03 ENCOUNTER — Ambulatory Visit (INDEPENDENT_AMBULATORY_CARE_PROVIDER_SITE_OTHER): Payer: Medicare Other | Admitting: Podiatry

## 2019-11-03 ENCOUNTER — Ambulatory Visit (INDEPENDENT_AMBULATORY_CARE_PROVIDER_SITE_OTHER): Payer: Medicare Other

## 2019-11-03 ENCOUNTER — Other Ambulatory Visit: Payer: Self-pay

## 2019-11-03 VITALS — BP 134/66

## 2019-11-03 DIAGNOSIS — M79671 Pain in right foot: Secondary | ICD-10-CM | POA: Diagnosis not present

## 2019-11-03 DIAGNOSIS — M2012 Hallux valgus (acquired), left foot: Secondary | ICD-10-CM | POA: Diagnosis not present

## 2019-11-03 DIAGNOSIS — M778 Other enthesopathies, not elsewhere classified: Secondary | ICD-10-CM | POA: Diagnosis not present

## 2019-11-03 DIAGNOSIS — M21619 Bunion of unspecified foot: Secondary | ICD-10-CM

## 2019-11-03 DIAGNOSIS — M79672 Pain in left foot: Secondary | ICD-10-CM

## 2019-11-03 DIAGNOSIS — M2011 Hallux valgus (acquired), right foot: Secondary | ICD-10-CM

## 2019-11-03 NOTE — Progress Notes (Signed)
Subjective:   Patient ID: Samantha Stevenson, female   DOB: 69 y.o.   MRN: [id number]   HPI Patient presents with a long-term painful bunion deformity left stating that she is tried wider shoes and soaks without relief and has bumps on top of both feet which are sore with inflammation inability to wear shoe gear comfortably.  States the bunion is present for a fairly long time it is worsened over that time with family history and patient does not smoke and likes to be active   Review of Systems  All other systems reviewed and are negative.       Objective:  Physical Exam Vitals and nursing note reviewed.  Constitutional:      Appearance: She is well-developed.  Pulmonary:     Effort: Pulmonary effort is normal.  Musculoskeletal:        General: Normal range of motion.  Skin:    General: Skin is warm.  Neurological:     Mental Status: She is alert.     Neurovascular status found to be intact muscle strength adequate range of motion within normal limits.  Patient is found to have hyperostosis medial aspect first metatarsal head of the left that is red and painful when palpated and is noted to have inflammation pain of the extensor complex bilateral.  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  I educated her on this and she will reappoint for consult in the next several weeks  X-rays indicate what appears to be some irritation around the midfoot bilateral with some swelling noted but no large spur formation and structural bunion deformity left over right

## 2019-11-10 ENCOUNTER — Ambulatory Visit (INDEPENDENT_AMBULATORY_CARE_PROVIDER_SITE_OTHER): Payer: Medicare Other | Admitting: Podiatry

## 2019-11-10 ENCOUNTER — Other Ambulatory Visit: Payer: Self-pay

## 2019-11-10 ENCOUNTER — Encounter: Payer: Self-pay | Admitting: Podiatry

## 2019-11-10 DIAGNOSIS — M21619 Bunion of unspecified foot: Secondary | ICD-10-CM

## 2019-11-10 DIAGNOSIS — M21612 Bunion of left foot: Secondary | ICD-10-CM | POA: Diagnosis not present

## 2019-11-10 DIAGNOSIS — M778 Other enthesopathies, not elsewhere classified: Secondary | ICD-10-CM

## 2019-11-10 NOTE — Patient Instructions (Signed)
Pre-Operative Instructions  Congratulations, you have decided to take an important step towards improving your quality of life.  You can be assured that the doctors and staff at [facility] will be with you every step of the way.  Here are some important things you should know:  1. Plan to be at the surgery center/hospital at least 1 (one) hour prior to your scheduled time, unless otherwise directed by the surgical center/hospital staff.  You must have a responsible adult accompany you, remain during the surgery and drive you home.  Make sure you have directions to the surgical center/hospital to ensure you arrive on time. 2. If you are having surgery at [facility] or [facility], you will need a copy of your medical history and physical form from your family physician within one month prior to the date of surgery. We will give you a form for your primary physician to complete.  3. We make every effort to accommodate the date you request for surgery.  However, there are times where surgery dates or times have to be moved.  We will contact you as soon as possible if a change in schedule is required.   4. No aspirin/ibuprofen for one week before surgery.  If you are on aspirin, any non-steroidal anti-inflammatory medications (Mobic, Aleve, Ibuprofen) should not be taken seven (7) days prior to your surgery.  You make take Tylenol for pain prior to surgery.  5. Medications - If you are taking daily heart and blood pressure medications, seizure, reflux, allergy, asthma, anxiety, pain or diabetes medications, make sure you notify the surgery center/hospital before the day of surgery so they can tell you which medications you should take or avoid the day of surgery. 6. No food or drink after midnight the night before surgery unless directed otherwise by surgical center/hospital staff. 7. No alcoholic beverages 24-hours prior to surgery.  No smoking 24-hours prior or 24-hours after  surgery. 8. Wear loose pants or shorts. They should be loose enough to fit over bandages, boots, and casts. 9. Don't wear slip-on shoes. Sneakers are preferred. 10. Bring your boot with you to the surgery center/hospital.  Also bring crutches or a walker if your physician has prescribed it for you.  If you do not have this equipment, it will be provided for you after surgery. 11. If you have not been contacted by the surgery center/hospital by the day before your surgery, call to confirm the date and time of your surgery. 12. Leave-time from work may vary depending on the type of surgery you have.  Appropriate arrangements should be made prior to surgery with your employer. 13. Prescriptions will be provided immediately following surgery by your doctor.  Fill these as soon as possible after surgery and take the medication as directed. Pain medications will not be refilled on weekends and must be approved by the doctor. 14. Remove nail polish on the operative foot and avoid getting pedicures prior to surgery. 15. Wash the night before surgery.  The night before surgery wash the foot and leg well with water and the antibacterial soap provided. Be sure to pay special attention to beneath the toenails and in between the toes.  Wash for at least three (3) minutes. Rinse thoroughly with water and dry well with a towel.  Perform this wash unless told not to do so by your physician.  Enclosed: 1 Ice pack (please put in freezer the night before surgery)   1 Hibiclens skin cleaner     Pre-op instructions  If you have any questions regarding the instructions, please do not hesitate to call our office.  [place]: [address], [place], [place] [address] -- [phone]  [place]: [address] [place], [place] [address] -- [phone]  [place]: [address], [place], [place] [address] -- [phone]   Website: [email or web address] 

## 2019-11-10 NOTE — Progress Notes (Signed)
Subjective:   Patient ID: Samantha Stevenson, female   DOB: 69 y.o.   MRN: [id number]   HPI Patient states feeling pretty good and I am ready to get the bunion on my left foot fixed   ROS      Objective:  Physical Exam  Neurovascular status intact with patient found to have significant reduction of inflammation dorsal feet bilateral with minimal pain and on the left is found to have moderate bunion deformity that is red and painful when palpated     Assessment:  Improved extensor tendinitis bilateral with structural bunion deformity and symptomatic left     Plan:  H&P condition reviewed and recommended bunion correction.  I explained procedure risk to patient she wants this done understanding all risks and signs consent form.  Patient scheduled for outpatient procedure and is willing to accept risk understanding recovery can take 6 months to 1 year and I did dispense air fracture walker today with all instructions on usage.  Patient is encouraged to call with any questions or concerns which may arise prior to procedure

## 2019-11-16 NOTE — Progress Notes (Signed)
Cardiology Office Note Date:  11/18/2019  Patient ID:  Samantha Stevenson, Samantha Stevenson 1951-08-24, MRN [id number] PCP:  Hoyt Koch, MD  Electrophysiologist:  Dr. Curt Bears   Chief Complaint:   pre-op, follow up on test results  History of Present Illness: Samantha Stevenson is a 69 y.o. 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   She comes in today to be seen for Dr. Curt Bears, last seen by him in 2017, at that time was referred to him for her Afib.  He mentions though having no records to review that actually showed her AFib, and that attempts to get them would be made, that she may benefit from a/c in the future if she does have AF.  She was c/o of some SOB and planned for an echo.  Echo then noted LVEF 55%, no WMA, no significant VHD, findings of elevated LVEDP and LA filling pressure.  She was not seen again until May 2019 by [facility], seeing [name], PA for surgical clearance prior to a thumb surgery.  She noted records obtained 2014 had EKGs both SR and pt declined a/c.  She was treated 6 mo in 2014 for PE. At the time of her visit the pt felt well, had visited [place] walked much without difficulty, had chrinic LE edema managed with support stockings and lasix.  Palpitations controlled with her dilt and dig well, though mentiuoned when not taking them would have Afib. She was cleared for her surgery, also noted the patient had become agreeable for a/c and planned to start Xarelto 2 days post op and have [place] follow up afterwards.  August 2019, she developed CP, went to [facility], given h/o PE had CT chest done, noting  an acute intramural hematoma of the thoracic aorta extending from the left subclavian artery to the renal artery origins, her BP brought under strict control and her xarelto reversed  CTS and VVS evaluated noted with type B  aortic intramural hematoma  06/13/2018 1.  Left subclavian to carotid artery transposition 2.  Percutaneous access right common femoral artery 3.  Thoracic endovascular endo-grafting with left subclavian artery coverage with 28 x 28 x 15 cm distal and 34 x 34 x 20 cm proximal Gore CTAG 4.  Left common carotid artery stent with 7 x 40 mm Innova distal an 8 x 40 mm Innova proximally 5.  Right common femoral endarterectomy with greater saphenous vein patch angioplasty 6.  Intravascular ultrasound thoracic aorta.  developed tachycardia/hypotension found with lymphatic leak  06/16/2018 1.  Exploration left neck wound placement of 10 flat drain 2.  Left sided 28 French chest tube placement  TTE done during this stay noted LVEF 55-60%, no WMA, grade I DD, There was evidence of aortic dissection flap noted   in the descending thoracic aorta (0.37cm flap posterior). Seen on   CT scan. No significant VHD.  She last saw Dr. Donzetta Matters 05/16/2019 doing well from her surgical perspective.  There was mention of some hematuria that she had apparently had in thepast as well.  He mentioned to discuss wether she needed the Xarelto (noting on it for DVT/PE history , no mention of Afib hx), and asked the pt to f/u with her PMD, but did say she needed lifeling ASA therapy for her stents.  She was stable from a vascular perspective in review of  her CT scan and planned for annual visit with him.  Looks like she saw her PMD Sept 2020 for constipation, neck pain and discussion about wanting to stop her xarelto.  She was recommended to discuss a/c with [place], noting CHA2DS2Vasc score of 5 and she recommended lifelong a/c   I saw her Dec 2020 for Dr. Curt Bears,  needing cardiac clearance for her pending C3-4, C4-5, C5-6, C6-7 ANTERIOR CERVICAL FUSION, not yet scheduled, planned for general anesthesia.   She had been cleared by [facility] for 3 days off her xarelto.   Her Afib hx was unclear, problem list reports she had MI associated  with Afib uin 2000, Epic notes reviewed as far back as I could go, problem list in 2014 mentions afib goes back to age 37 managed with medicines, no helpful or other information in care everywhere.   She reported no palpitations or symptoms of her AFib for a few years.  Gave history that she was diagnosed with AFib at age 63 and has had palpitations all her life though not until her adult years did it become symptomatic and reported back in 2000 having a heart attack attribute to her AFib.  Since being put on her current regime years ago, it has been well controlled.  She was having significant pain with her C-spine disease, neck, shoulder and L arm, and in the last few weeks R posterior arm as well, with some swelling.  She denied any missed doses of her xarelto.  In discussion for her surgical clearance of her exertional capacity/estimate of her METS, she described fairly significant exertional incapacities, she avoided stairs these made her SOB, mentioned prior to her procedures, hospital stay above Aug 2019 she would walk her dog every day, but since then just has not had the energy, and as time has passed feels she reported getting winded, SOB with minimal exertion.  She gave an example that when shopping with her daughters she will sit while they browse around.  She said some days she could probably walk a block, but generally did not think she could. She doesn't vacuume or sweep because these make her SOB  She mentioned a new CP in the prior 2 weeks, she says she thought it was the way she sleeps at night that makes her chest hurt, maybe laying on her L side, but then says some times it is a strong ache during the day that worries her, but then passes it off to musculoskeletal, "but I am not a doctor, so I don't know"  No reports of near syncope or syncope.  She reported when she was on high dose ASA with her Xarelto she had hematuria, though on low dose less.  She saw her urologist the week prior  had follow up planned.  She had not been told to stop her xarelto, and hematuria is occasional and not new.  No other bleeding or signs of bleeding.  mantions when she was on Plavix and xarelto she had rectal bleeding at times.  Given her symptoms (though not particularly cardiac sounding CP, with reports of reduced exertional capacity over the last couple years, planned for ischemic 2/u prior to her surgery. TTE noted LVEF 60-65%, elevated LVEDP, grade I DD, no WMA, no significant VHD.  Stress test was negative, low risk. Given elevated LVEDP and some DD recommended to start low dose lasix.  F/u BMET was done with stable findings.  She was felt to be low cardiac risk for her planned  C-spine surgery.  She is also now pending a L bunion surgery as well as her C-spine fusion, neither scheduled yet.  She mentions that she spoke with her PMD about the pain in her arm and also felt it sounded like was 2/2 her C spine issue, She does not have any ongoing c/o CP since seen here last.  Does not exercise or do much physically, as discussed above.  No new complaints or symptoms    RCRI score is zero, 0.4% risk (low risk)   Past Medical History:  Diagnosis Date  . Allergy   . Anemia   . Arthritis    "qwhere" (07/29/2018)  . Atrial fibrillation ([place])   . Clotting disorder ([place])   . Degenerative arthritis of hip    s/p L THR 12/2012  . Dyspnea    since surgery in August 2019- "when I get worked up and walk a short distance"  . Dysrhythmia   . History of blood transfusion 05/2018   "related to OR"  . Hyperlipidemia   . Hypertension   . Migraines    mIgraines- none since blood pressure and lipids are under control  . Myocardial infarction ([place]) 2000   due to atrial fib  . Neuromuscular disorder ([place])    pinched nerve- left side of neck  . Pulmonary emboli ([place]) 12/2012 dx   post op (L THR), anticoag x 40mo    Past Surgical History:  Procedure Laterality Date  . ABDOMINAL HYSTERECTOMY     "w/1  tube"  . ARTERY REPAIR Left 08/15/2018   Procedure: LEFT BRACHIAL ARTERY EXPLORATION;  Surgeon: Waynetta Sandy, MD;  Location: [facility];  Service: Vascular;  Laterality: Left;  . Breast Ultrasound Left 04/16/13   Done @ breast center Impression: no malignancy appearance noted on the screen study is consistent with a summation shadow  . CARDIAC CATHETERIZATION    . CAROTID-SUBCLAVIAN BYPASS GRAFT Left 06/13/2018   Procedure: BYPASS GRAFT CAROTID-SUBCLAVIAN;  Surgeon: Waynetta Sandy, MD;  Location: [place];  Service: Vascular;  Laterality: Left;  . COLONOSCOPY    . DG TUMB RIGHT HAND Right    cyst removal  . IR THORACENTESIS ASP PLEURAL SPACE W/IMG GUIDE  05/31/2018  . THORACIC AORTIC ENDOVASCULAR STENT GRAFT Left 06/13/2018   Procedure: LEFT  SUBCLAVIAN TO CAROTID ARTERY TRANSPOSITION; THORACIC AORTIC ENDOVASCULAR STENT GRAFT using GORE CONFORMABLE THORACIC STENT GRAFT AND GORE TAG THORACIC ENDOPROSTHESIS; STENT LEFT COMMON CAROTID ARTERY, RIGHT COMMON-FEMORAL ENDARTERECTOMY WITH PATCH ANGIOPLASTY;  Surgeon: Waynetta Sandy, MD;  Location: [place];  Service: Vascular;  Laterality: Left;  . TONSILLECTOMY    . TOTAL HIP ARTHROPLASTY Left 01/03/2013   Procedure: TOTAL HIP ARTHROPLASTY ANTERIOR APPROACH;  Surgeon: Mcarthur Rossetti, MD;  Location: [facility];  Service: Orthopedics;  Laterality: Left;  Left Total Hip Arthroplasty, Anterior Approach  . TUBAL LIGATION    . VAGINA RECONSTRUCTION SURGERY  1966   vagina had closed up when 69 years old  . WOUND EXPLORATION Left 06/16/2018   Procedure: LEFT NECK WOUND EXPLORATION, CHEST TUBE INSERTION;  Surgeon: Waynetta Sandy, MD;  Location: [facility];  Service: Vascular;  Laterality: Left;    Current Outpatient Medications  Medication Sig Dispense Refill  . acetaminophen (TYLENOL) 500 MG tablet Take 1,000 mg by mouth as needed for moderate pain.     Marland Kitchen digoxin (LANOXIN) 0.25 MG tablet Take 0.5 tablets (125 mcg total) by mouth  every morning. 90 tablet 1  . diltiazem (DILT-XR) 240 MG 24 hr  capsule Take 1 capsule (240 mg total) by mouth every morning. 90 capsule 1  . ferrous sulfate 325 (65 FE) MG tablet Take 325 mg by mouth once a week.     . furosemide (LASIX) 20 MG tablet Take 1 tablet (20 mg total) by mouth daily. 90 tablet 1  . Multiple Vitamin (MULTIVITAMIN) tablet Take 1 tablet by mouth daily.    . rivaroxaban (XARELTO) 20 MG TABS tablet TAKE 1 TABLET(20 MG) BY MOUTH DAILY WITH SUPPER 90 tablet 0  . rosuvastatin (CRESTOR) 20 MG tablet Take 1 tablet (20 mg total) by mouth daily. 90 tablet 1   No current facility-administered medications for this visit.    Allergies:   Penicillins, Latex, Banana, Asa [aspirin], Celebrex [celecoxib], and Nsaids   Social History:  The patient  reports that she has never smoked. She has never used smokeless tobacco. She reports previous alcohol use. She reports that she does not use drugs.   Family History:  The patient's family history includes Alcohol abuse in an other family member; Breast cancer in an other family member; Diabetes in her mother, sister, and another family member; Heart attack (age of onset: 51) in her mother; Heart disease in her father, mother, and another family member; Hyperlipidemia in an other family member; Hypertension in an other family member.  ROS:  Please see the history of present illness.  All other systems are reviewed and otherwise negative.   PHYSICAL EXAM:  VS:  BP (!) 148/76   Pulse 74   Ht 5\' 4"  (1.626 m)   Wt 160 lb (72.6 kg)   SpO2 96%   BMI 27.46 kg/m  BMI: Body mass index is 27.46 kg/m. Well nourished, well developed, in no acute distress  HEENT: normocephalic, atraumatic  Neck: no JVD, carotid bruits or masses Cardiac: RRR; no significant murmurs, no rubs, or gallops Lungs:  CTA b/l , no wheezing, rhonchi or rales  Abd: soft, nontender MS: no deformity or atrophy Ext: no edema Skin: warm and dry, no rash Neuro:  No gross  deficits appreciated Psych: euthymic mood, full affect    EKG:  Done 10/07/2019 was reviewed by myself showed SR 66bppm,  LAD, unchanged from prior   10/20/2019: Lexiscan stress myoview  Nuclear stress EF: 66%.  There was no ST segment deviation noted during stress.  No T wave inversion was noted during stress.  The study is normal.  This is a low risk study. No ischemia.  The left ventricular ejection fraction is hyperdynamic (>65%).  10/20/2019 TTE IMPRESSIONS  1. Left ventricular ejection fraction, by visual estimation, is 60 to 65%. The left ventricle has normal function. There is no left ventricular hypertrophy.  2. Elevated left ventricular end-diastolic pressure.  3. Left ventricular diastolic parameters are consistent with Grade I diastolic dysfunction (impaired relaxation).  4. The left ventricle has no regional wall motion abnormalities.  5. Global right ventricle has normal systolic function.The right ventricular size is normal. No increase in right ventricular wall thickness.  6. Left atrial size was mildly dilated.  7. Right atrial size was normal.  8. The mitral valve is normal in structure. Trivial mitral valve regurgitation. No evidence of mitral stenosis.  9. The tricuspid valve is normal in structure. 10. The aortic valve is normal in structure. Aortic valve regurgitation is not visualized. No evidence of aortic valve sclerosis or stenosis. 11. The pulmonic valve was normal in structure. Pulmonic valve regurgitation is not visualized. 12. Normal pulmonary artery systolic pressure. 13.  The tricuspid regurgitant velocity is 2.12 m/s, and with an assumed right atrial pressure of 3 mmHg, the estimated right ventricular systolic pressure is normal at 21.0 mmHg. 14. The inferior vena cava is normal in size with greater than 50% respiratory variability, suggesting right atrial pressure of 3 mmHg.   05/28/2018: TTE Study Conclusions - HPI and indications: 441.01  Dissection of Aorta , Thoracic. - Left ventricle: The cavity size was normal. Systolic function was   normal. The estimated ejection fraction was in the range of 55%   to 60%. Wall motion was normal; there were no regional wall   motion abnormalities. Doppler parameters are consistent with   abnormal left ventricular relaxation (grade 1 diastolic   dysfunction). - Aortic valve: Trileaflet; mildly thickened leaflets. There was no   regurgitation. - Aortic root: There was evidence of aortic dissection flap noted   in the descending thoracic aorta (0.37cm flap posterior). Seen on   CT scan. - Mitral valve: Calcified annulus. - Tricuspid valve: There was mild regurgitation. - Pulmonary arteries: Systolic pressure was mildly increased. PA   peak pressure: 36 mm Hg (S). - Pericardium, extracardiac: A trivial pericardial effusion was   identified posterior to the heart   2D echo 07/19/2016 Study Conclusions - Left ventricle: Distal septal hypokinesis. The cavity size was normal. Systolic function was normal. The estimated ejection fraction was 55%. Wall motion was normal; there were no regional wall motion abnormalities. Doppler parameters are consistent with both elevated ventricular end-diastolic filling pressure and elevated left atrial filling pressure. - Atrial septum: No defect or patent foramen ovale was identified. - Pericardium, extracardiac: A trivial pericardial effusion was identified posterior to the heart  Recent Labs: 05/16/2019: ALT 13; Hemoglobin 10.4; Platelets 314.0 10/30/2019: BUN 15; Creatinine, Ser 0.78; Potassium 3.8; Sodium 138  No results found for requested labs within last 8760 hours.   Estimated Creatinine Clearance: 67.6 mL/min (by C-G formula based on SCr of 0.78 mg/dL).   Wt Readings from Last 3 Encounters:  11/18/19 160 lb (72.6 kg)  10/07/19 163 lb (73.9 kg)  08/26/19 159 lb 9.6 oz (72.4 kg)     Other studies reviewed: Additional  studies/records reviewed today include: summarized above  ASSESSMENT AND PLAN:  1. Atrial fibrillation     CHA2DS2Vasc is 5 (including gender)      H/o provoked DVT/PE (post hip surgery)     Burden is low by lack of symptoms I don't think we have any formal record of her Afib though she relates a clear and known longstanding diagnosis of this Some hematuria intermittently though not new   2. HTN     Has been better previously     She is asked to monitor this, no changes   4. DOE, CP     Low risk stress test, preserved LVEF with grade I DD     No exam findings to suggest volume OL     CP sounds atypical, her DOE suspect to be 2/2 deconditioning      No rest SOB, no symptoms of PND or orthopnea   We discussed once past her surgeries and cleared to by her surgeon's to start walking for exercise.    5. Pre-op    Bunion Dr. Paulla Dolly    C-spine fusion, Dr. Annette Stable    Low cardiac risk for surgeries planned    OK to hold xarelto 3 days pre-op, to resume as per surgeon's post op     Disposition: will see her back  in 23mo, sooner if needed   Current medicines are reviewed at length with the patient today.  The patient did not have any concerns regarding medicines.  Venetia Night, PA-C 11/18/2019 11:18 AM     [facility] [address] [place] [address] [phone] (office)  [phone] (fax)

## 2019-11-18 ENCOUNTER — Other Ambulatory Visit: Payer: Self-pay

## 2019-11-18 ENCOUNTER — Other Ambulatory Visit: Payer: Self-pay | Admitting: Neurosurgery

## 2019-11-18 ENCOUNTER — Ambulatory Visit (INDEPENDENT_AMBULATORY_CARE_PROVIDER_SITE_OTHER): Payer: Medicare Other | Admitting: Physician Assistant

## 2019-11-18 VITALS — BP 148/76 | HR 74 | Ht 64.0 in | Wt 160.0 lb

## 2019-11-18 DIAGNOSIS — I480 Paroxysmal atrial fibrillation: Secondary | ICD-10-CM | POA: Diagnosis not present

## 2019-11-18 DIAGNOSIS — R0600 Dyspnea, unspecified: Secondary | ICD-10-CM | POA: Diagnosis not present

## 2019-11-18 DIAGNOSIS — I10 Essential (primary) hypertension: Secondary | ICD-10-CM | POA: Diagnosis not present

## 2019-11-18 DIAGNOSIS — R0609 Other forms of dyspnea: Secondary | ICD-10-CM

## 2019-11-18 DIAGNOSIS — Z01818 Encounter for other preprocedural examination: Secondary | ICD-10-CM | POA: Diagnosis not present

## 2019-11-18 NOTE — Patient Instructions (Signed)
Medication Instructions:   Your physician recommends that you continue on your current medications as directed. Please refer to the Current Medication list given to you today.  *If you need a refill on your cardiac medications before your next appointment, please call your pharmacy*  Lab Work: [place]   If you have labs (blood work) drawn today and your tests are completely normal, you will receive your results only by: Marland Kitchen MyChart Message (if you have MyChart) OR . A paper copy in the mail If you have any lab test that is abnormal or we need to change your treatment, we will call you to review the results.  Testing/Procedures: NONE ORDERED  TODAY    Follow-Up: At [facility], you and your health needs are our priority.  As part of our continuing mission to provide you with exceptional heart care, we have created designated Provider Care Teams.  These Care Teams include your primary Cardiologist (physician) and Advanced Practice Providers (APPs -  Physician Assistants and Nurse Practitioners) who all work together to provide you with the care you need, when you need it.  Your next appointment:   6 months   The format for your next appointment:   In Person  Provider:    You may see [place]  or one of the following Advanced Practice Providers on your designated Care Team:    Chanetta Marshall, NP  Tommye Standard, PA-C  Legrand Como "Jonni Sanger" Chalmers Cater, [place]   Other Instructions

## 2019-11-24 ENCOUNTER — Encounter: Payer: Medicare Other | Admitting: Podiatry

## 2019-11-25 DIAGNOSIS — R801 Persistent proteinuria, unspecified: Secondary | ICD-10-CM | POA: Diagnosis not present

## 2019-11-25 DIAGNOSIS — R3121 Asymptomatic microscopic hematuria: Secondary | ICD-10-CM | POA: Diagnosis not present

## 2019-11-25 DIAGNOSIS — N362 Urethral caruncle: Secondary | ICD-10-CM | POA: Diagnosis not present

## 2019-12-01 MED ORDER — ONDANSETRON HCL 4 MG PO TABS: 4.0000 mg | ORAL_TABLET | Freq: Three times a day (TID) | ORAL | 0 refills | Status: DC | PRN

## 2019-12-01 MED ORDER — OXYCODONE-ACETAMINOPHEN 10-325 MG PO TABS: 1.0000 | ORAL_TABLET | Freq: Four times a day (QID) | ORAL | 0 refills | Status: DC | PRN

## 2019-12-01 NOTE — Addendum Note (Signed)
Addended by: Wallene Huh on: 12/01/2019 04:39 PM   Modules accepted: Orders

## 2019-12-02 ENCOUNTER — Encounter: Payer: Self-pay | Admitting: Podiatry

## 2019-12-02 DIAGNOSIS — M2012 Hallux valgus (acquired), left foot: Secondary | ICD-10-CM | POA: Diagnosis not present

## 2019-12-08 ENCOUNTER — Telehealth: Payer: Self-pay | Admitting: Podiatry

## 2019-12-08 ENCOUNTER — Encounter: Payer: Self-pay | Admitting: Podiatry

## 2019-12-08 ENCOUNTER — Telehealth: Payer: Self-pay

## 2019-12-08 ENCOUNTER — Ambulatory Visit (INDEPENDENT_AMBULATORY_CARE_PROVIDER_SITE_OTHER): Payer: Medicare Other | Admitting: Podiatry

## 2019-12-08 ENCOUNTER — Other Ambulatory Visit: Payer: Self-pay

## 2019-12-08 ENCOUNTER — Ambulatory Visit (INDEPENDENT_AMBULATORY_CARE_PROVIDER_SITE_OTHER): Payer: Medicare Other

## 2019-12-08 VITALS — BP 139/79 | HR 83 | Temp 97.7°F | Resp 16

## 2019-12-08 DIAGNOSIS — M2012 Hallux valgus (acquired), left foot: Secondary | ICD-10-CM

## 2019-12-08 NOTE — Telephone Encounter (Signed)
Pt states if we can get the walker Rx sent to [facility] Fax number: [phone]

## 2019-12-08 NOTE — Telephone Encounter (Signed)
Pt called and is on crutches and going home almost fell. She is wanting to get a scooter if you would write a rx for that please.

## 2019-12-08 NOTE — Progress Notes (Signed)
Subjective:   Patient ID: Samantha Stevenson, female   DOB: 69 y.o.   MRN: [id number]   HPI Patient presents stating that the left foot is doing well she is still using crutches and having mild discomfort   ROS      Objective:  Physical Exam  Neurovascular status intact negative Bevelyn Buckles' sign noted with wound edges left healed well hallux in rectus position good range of motion with no crepitus of the joint     Assessment:  Doing well post osteotomy first metatarsal left     Plan:  Reviewed condition recommended continued elevation compression and reapplied dressing to the foot and advised on continued immobilization.  Reappoint in 2 to 3 weeks or earlier if needed  X-rays indicate that there is good healing of the osteotomy alignment in place fixation in place no indication of movement

## 2019-12-09 NOTE — Addendum Note (Signed)
Addended by: Celene Skeen A on: 12/09/2019 05:02 PM   Modules accepted: Orders

## 2019-12-22 ENCOUNTER — Encounter: Payer: Medicare Other | Admitting: Podiatry

## 2019-12-25 DIAGNOSIS — N8189 Other female genital prolapse: Secondary | ICD-10-CM | POA: Diagnosis not present

## 2019-12-31 ENCOUNTER — Other Ambulatory Visit: Payer: Self-pay | Admitting: Cardiology

## 2020-01-01 NOTE — Telephone Encounter (Signed)
Pt last saw Samantha Stevenson, [place] on 11/18/19, last labs 10/30/19 Creat 0.78, age 69, weight 72.6kg, CrCl 80.21, based on CrCl pt is on appropriate dosage of Xarelto 20mg  QD.  Will refill rx.

## 2020-01-02 ENCOUNTER — Encounter: Payer: Medicare Other | Admitting: Podiatry

## 2020-01-05 ENCOUNTER — Other Ambulatory Visit: Payer: Self-pay

## 2020-01-05 ENCOUNTER — Encounter: Payer: Medicare Other | Admitting: Podiatrist

## 2020-01-05 ENCOUNTER — Ambulatory Visit: Payer: Medicare Other

## 2020-01-05 DIAGNOSIS — M2012 Hallux valgus (acquired), left foot: Secondary | ICD-10-CM

## 2020-01-06 NOTE — Progress Notes (Signed)
Patient left without being seen.  This encounter was created in error - please disregard. 

## 2020-01-14 ENCOUNTER — Ambulatory Visit: Payer: Medicare Other | Attending: Obstetrics and Gynecology | Admitting: Physical Therapy

## 2020-01-14 ENCOUNTER — Encounter: Payer: Self-pay | Admitting: Physical Therapy

## 2020-01-14 ENCOUNTER — Other Ambulatory Visit: Payer: Self-pay

## 2020-01-14 DIAGNOSIS — N819 Female genital prolapse, unspecified: Secondary | ICD-10-CM

## 2020-01-14 NOTE — Therapy (Signed)
[facility] [address] [place], [place], [address] Phone: [phone]   Fax:  [phone]  Physical Therapy Evaluation  Patient Details  Name: Samantha Stevenson MRN: [id number] Date of Birth: 1951-09-04 No data recorded  Encounter Date: 01/14/2020    Past Medical History:  Diagnosis Date  . Allergy   . Anemia   . Arthritis    "qwhere" (07/29/2018)  . Atrial fibrillation ([place])   . Clotting disorder ([place])   . Degenerative arthritis of hip    s/p L THR 12/2012  . Dyspnea    since surgery in August 2019- "when I get worked up and walk a short distance"  . Dysrhythmia   . History of blood transfusion 05/2018   "related to OR"  . Hyperlipidemia   . Hypertension   . Migraines    mIgraines- none since blood pressure and lipids are under control  . Myocardial infarction ([place]) 2000   due to atrial fib  . Neuromuscular disorder ([place])    pinched nerve- left side of neck  . Pulmonary emboli ([place]) 12/2012 dx   post op (L THR), anticoag x 25mo    Past Surgical History:  Procedure Laterality Date  . ABDOMINAL HYSTERECTOMY     "w/1 tube"  . ARTERY REPAIR Left 08/15/2018   Procedure: LEFT BRACHIAL ARTERY EXPLORATION;  Surgeon: Waynetta Sandy, MD;  Location: [place];  Service: Vascular;  Laterality: Left;  . Breast Ultrasound Left 04/16/13   Done @ breast center Impression: no malignancy appearance noted on the screen study is consistent with a summation shadow  . CARDIAC CATHETERIZATION    . CAROTID-SUBCLAVIAN BYPASS GRAFT Left 06/13/2018   Procedure: BYPASS GRAFT CAROTID-SUBCLAVIAN;  Surgeon: Waynetta Sandy, MD;  Location: [place];  Service: Vascular;  Laterality: Left;  . COLONOSCOPY    . DG TUMB RIGHT HAND Right    cyst removal  . IR THORACENTESIS ASP PLEURAL SPACE W/IMG GUIDE  05/31/2018  . THORACIC AORTIC ENDOVASCULAR STENT GRAFT Left 06/13/2018   Procedure: LEFT  SUBCLAVIAN TO CAROTID ARTERY  TRANSPOSITION; THORACIC AORTIC ENDOVASCULAR STENT GRAFT using GORE CONFORMABLE THORACIC STENT GRAFT AND GORE TAG THORACIC ENDOPROSTHESIS; STENT LEFT COMMON CAROTID ARTERY, RIGHT COMMON-FEMORAL ENDARTERECTOMY WITH PATCH ANGIOPLASTY;  Surgeon: Waynetta Sandy, MD;  Location: [place];  Service: Vascular;  Laterality: Left;  . TONSILLECTOMY    . TOTAL HIP ARTHROPLASTY Left 01/03/2013   Procedure: TOTAL HIP ARTHROPLASTY ANTERIOR APPROACH;  Surgeon: Mcarthur Rossetti, MD;  Location: [facility];  Service: Orthopedics;  Laterality: Left;  Left Total Hip Arthroplasty, Anterior Approach  . TUBAL LIGATION    . VAGINA RECONSTRUCTION SURGERY  1966   vagina had closed up when 69 years old  . WOUND EXPLORATION Left 06/16/2018   Procedure: LEFT NECK WOUND EXPLORATION, CHEST TUBE INSERTION;  Surgeon: Waynetta Sandy, MD;  Location: [place];  Service: Vascular;  Laterality: Left;    There were no vitals filed for this visit.   Subjective Assessment - 01/14/20 1023    Subjective  Patient is having cervical fusion on 4/12 and would not be able to come to therapy for 2 months. After discussing with patient it would be best if she starts therapy after her cervical fusion and is cleared by her surgeon.    Currently in Pain?  No/denies                    Objective measurements completed on examination: See above findings.  Patient will be having surgery on 02/02/2020 for ACDF C3-4, C4-5, C5-6, C6-7. This will take 2 months for recovery. Patient and therapist felt it would be best if she started therapy afterwards and when she is cleared by her surgeon. We will hold onto her script for when she is ready to start physical therapy.  Earlie Counts, PT 01/14/20 10:41 AM                           Patient will benefit from skilled therapeutic intervention in order to improve the following deficits and impairments:     Visit Diagnosis: Female genital prolapse,  unspecified type     Problem List Patient Active Problem List   Diagnosis Date Noted  . Chronic neck pain 07/17/2019  . LLQ pain 05/16/2019  . Gross hematuria 05/16/2019  . Chronic bilateral low back pain without sciatica 10/07/2018  . Pain and swelling of left lower leg 08/08/2018  . Left arm swelling 08/08/2018  . Hematuria, gross 07/29/2018  . Thoracic aortic dissection ([place]) 06/13/2018  . Intramural aortic hematoma ([place]) 05/28/2018  . Dysfunction of left eustachian tube 04/05/2018  . Preoperative clearance 02/27/2018  . Bilateral leg edema 02/27/2018  . Dyspnea 06/19/2016  . Routine general medical examination at a health care facility 06/24/2015  . Constipation 03/17/2015  . Myalgia and myositis 03/17/2015  . Dyslipidemia   . Atrial fibrillation ([place])   . Hypertension   . History of pulmonary embolism 01/10/2013  . Anemia 01/10/2013  . Arthritis 01/03/2013    Earlie Counts, PT 01/14/20 10:41 AM   [facility] [address] [place], [place], [address] Phone: [phone]   Fax:  [phone]  Name: Mileydi Deger MRN: [id number] Date of Birth: 1950-11-04

## 2020-01-16 ENCOUNTER — Ambulatory Visit (INDEPENDENT_AMBULATORY_CARE_PROVIDER_SITE_OTHER): Payer: Medicare Other | Admitting: Podiatry

## 2020-01-16 ENCOUNTER — Ambulatory Visit (INDEPENDENT_AMBULATORY_CARE_PROVIDER_SITE_OTHER): Payer: Medicare Other

## 2020-01-16 ENCOUNTER — Encounter: Payer: Self-pay | Admitting: Podiatry

## 2020-01-16 DIAGNOSIS — M2012 Hallux valgus (acquired), left foot: Secondary | ICD-10-CM

## 2020-01-19 NOTE — Progress Notes (Signed)
Subjective:   Patient ID: Samantha Stevenson, female   DOB: 69 y.o.   MRN: [id number]   HPI Patient states overall doing well with mild swelling around the first MPJ left localized in nature but able to wear shoe and is walking with a reasonable heel toe gait pattern   ROS      Objective:  Physical Exam  Neurovascular status intact negative Bevelyn Buckles' sign noted with patient's left foot healing well wound edges well coapted hallux in rectus position     Assessment:  Doing well post osteotomy first metatarsal left     Plan:  H&P reviewed condition and recommended that we continue conservative care continue wearing rigid bottom shoes gradual increase in activities and no jumping on the foot currently.  Reappoint to recheck  X-rays indicate the osteotomy is healing well no indications of advanced pathology with fixation in place joint congruence

## 2020-01-21 ENCOUNTER — Telehealth: Payer: Self-pay | Admitting: Internal Medicine

## 2020-01-21 NOTE — Progress Notes (Signed)
  Chronic Care Management   Outreach Note  01/21/2020 Name: Samantha Stevenson MRN: [id number] DOB: 09-17-1951  Referred by: Hoyt Koch, MD Reason for referral : No chief complaint on file.   An unsuccessful telephone outreach was attempted today. The patient was referred to the pharmacist for assistance with care management and care coordination.   Follow Up Plan:   Raynicia Dukes [employer] Scheduler

## 2020-01-22 ENCOUNTER — Telehealth: Payer: Self-pay | Admitting: Internal Medicine

## 2020-01-22 DIAGNOSIS — I4891 Unspecified atrial fibrillation: Secondary | ICD-10-CM

## 2020-01-22 NOTE — Progress Notes (Signed)
  Chronic Care Management   Note  01/22/2020 Name: Samantha Stevenson MRN: [id number] DOB: 02/11/1951  Samantha Stevenson is a 69 y.o. year old female who is a primary care patient of Sammara, [place], MD. I reached out to [employer] by phone today in response to a referral sent by Samantha Stevenson's PCP, Hoyt Koch, MD.   Samantha Stevenson was given information about Chronic Care Management services today including:  1. CCM service includes personalized support from designated clinical staff supervised by her physician, including individualized plan of care and coordination with other care providers 2. 24/7 contact phone numbers for assistance for urgent and routine care needs. 3. Service will only be billed when office clinical staff spend 20 minutes or more in a month to coordinate care. 4. Only one practitioner may furnish and bill the service in a calendar month. 5. The patient may stop CCM services at any time (effective at the end of the month) by phone call to the office staff.   Patient agreed to services and verbal consent obtained.   Follow up plan:  Samantha Stevenson [employer] Scheduler

## 2020-01-26 ENCOUNTER — Other Ambulatory Visit: Payer: Self-pay | Admitting: Internal Medicine

## 2020-01-28 NOTE — Progress Notes (Signed)
[facility] [phone] - [place], [address] AT [address] [address] Phone: [phone] Fax: [phone]  [facility] [phone] - [place], [address] AT [place] [address] Phone: [phone] Fax: [phone]      Your procedure is scheduled on February 02, 2020.  Report to [facility] Main Entrance "A" at 6:00 A.M., and check in at the Admitting office.  Call this number if you have problems the morning of surgery:  [phone]  Call [phone] if you have any questions prior to your surgery date Monday-Friday 8am-4pm    Remember:  Do not eat or drink after midnight the night before your surgery    Take these medicines the morning of surgery with A SIP OF WATER: digoxin (LANOXIN) diltiazem (DILACOR XR)  rosuvastatin (CRESTOR) acetaminophen (TYLENOL) -As needed ondansetron (ZOFRAN) - as needed oxyCODONE-acetaminophen (PERCOCET) - as needed   Follow your surgeon's instructions on when to stop XARELTO.  If no instructions were given by your surgeon then you will need to call the office to get those instructions.    As of today, STOP taking any Aspirin (unless otherwise instructed by your surgeon) and Aspirin containing products, Aleve, Naproxen, Ibuprofen, Motrin, Advil, Goody's, BC's, all herbal medications, fish oil, and all vitamins.                      Do not wear jewelry, make up, or nail polish            Do not wear lotions, powders, perfumes or deodorant.            Do not shave 48 hours prior to surgery.              Do not bring valuables to the hospital.            [facility] is not responsible for any belongings or valuables.  Do NOT Smoke (Tobacco/Vapping) or drink Alcohol 24 hours prior to your procedure If you use a CPAP at night, you may bring all equipment for your overnight stay.    Contacts, glasses, dentures or bridgework may not be worn into surgery.      For patients admitted to the hospital, discharge time will be determined by your treatment team.   Patients discharged the day of surgery will not be allowed to drive home, and someone needs to stay with them for 24 hours.    Special instructions:   [facility]- Preparing For Surgery  Before surgery, you can play an important role. Because skin is not sterile, your skin needs to be as free of germs as possible. You can reduce the number of germs on your skin by washing with CHG (chlorahexidine gluconate) Soap before surgery.  CHG is an antiseptic cleaner which kills germs and bonds with the skin to continue killing germs even after washing.    Oral Hygiene is also important to reduce your risk of infection.  Remember - BRUSH YOUR TEETH THE MORNING OF SURGERY WITH YOUR REGULAR TOOTHPASTE  Please do not use if you have an allergy to CHG or antibacterial soaps. If your skin becomes reddened/irritated stop using the CHG.  Do not shave (including legs and underarms) for at least 48 hours prior to first CHG shower. It is OK to shave your face.  Please follow these instructions carefully.   1. Shower the [employer]  BEFORE SURGERY and the MORNING OF SURGERY with CHG Soap.   2. If you chose to wash your hair, wash your hair first as usual with your normal shampoo.  3. After you shampoo, rinse your hair and body thoroughly to remove the shampoo.  4. Use CHG as you would any other liquid soap. You can apply CHG directly to the skin and wash gently with a scrungie or a clean washcloth.   5. Apply the CHG Soap to your body ONLY FROM THE NECK DOWN.  Do not use on open wounds or open sores. Avoid contact with your eyes, ears, mouth and genitals (private parts). Wash Face and genitals (private parts)  with your normal soap.   6. Wash thoroughly, paying special attention to the area where your surgery will be  performed.  7. Thoroughly rinse your body with warm water from the neck down.  8. DO NOT shower/wash with your normal soap after using and rinsing off the CHG Soap.  9. Pat yourself dry with a CLEAN TOWEL.  10. Wear CLEAN PAJAMAS to bed the night before surgery, wear comfortable clothes the morning of surgery  11. Place CLEAN SHEETS on your bed the night of your first shower and DO NOT SLEEP WITH PETS.   Day of Surgery:   Do not apply any deodorants/lotions.  Please wear clean clothes to the hospital/surgery center.   Remember to brush your teeth WITH YOUR REGULAR TOOTHPASTE.   Please read over the following fact sheets that you were given.

## 2020-01-29 ENCOUNTER — Encounter (HOSPITAL_COMMUNITY): Payer: Self-pay

## 2020-01-29 ENCOUNTER — Encounter (HOSPITAL_COMMUNITY)
Admission: RE | Admit: 2020-01-29 | Discharge: 2020-01-29 | Disposition: A | Discharge status: Home / Self Care | Payer: Medicare Other | Source: Ambulatory Visit | Attending: Neurosurgery | Admitting: Neurosurgery

## 2020-01-29 ENCOUNTER — Other Ambulatory Visit (HOSPITAL_COMMUNITY)
Admission: RE | Admit: 2020-01-29 | Discharge: 2020-01-29 | Disposition: A | Discharge status: Home / Self Care | Payer: Medicare Other | Source: Ambulatory Visit | Attending: Neurosurgery | Admitting: Neurosurgery

## 2020-01-29 ENCOUNTER — Other Ambulatory Visit: Payer: Self-pay

## 2020-01-29 ENCOUNTER — Encounter: Payer: Self-pay | Admitting: Internal Medicine

## 2020-01-29 DIAGNOSIS — Z01812 Encounter for preprocedural laboratory examination: Secondary | ICD-10-CM | POA: Insufficient documentation

## 2020-01-29 DIAGNOSIS — Z20822 Contact with and (suspected) exposure to covid-19: Secondary | ICD-10-CM | POA: Diagnosis not present

## 2020-01-29 HISTORY — DX: Heart failure, unspecified: I50.9

## 2020-01-29 LAB — BASIC METABOLIC PANEL
Anion gap: 11 (ref 5–15)
BUN: 11 mg/dL (ref 8–23)
CO2: 24 mmol/L (ref 22–32)
Calcium: 9 mg/dL (ref 8.9–10.3)
Chloride: 104 mmol/L (ref 98–111)
Creatinine, Ser: 0.76 mg/dL (ref 0.44–1.00)
GFR calc Af Amer: >60 mL/min (ref >60)
GFR calc non Af Amer: >60 mL/min (ref >60)
Glucose, Bld: 91 mg/dL (ref 70–99)
Potassium: 4.1 mmol/L (ref 3.5–5.1)
Sodium: 139 mmol/L (ref 135–145)

## 2020-01-29 LAB — PROTIME-INR
INR: 1.2 (ref 0.8–1.2)
Prothrombin Time: 15 seconds (ref 11.4–15.2)

## 2020-01-29 LAB — CBC WITH DIFFERENTIAL/PLATELET
Abs Immature Granulocytes: 0.02 10*3/uL (ref 0.00–0.07)
Basophils Absolute: 0 10*3/uL (ref 0.0–0.1)
Basophils Relative: 0 %
Eosinophils Absolute: 0.3 10*3/uL (ref 0.0–0.5)
Eosinophils Relative: 5 %
HCT: 38.2 % (ref 36.0–46.0)
Hemoglobin: 11.3 g/dL — ABNORMAL LOW (ref 12.0–15.0)
Immature Granulocytes: 0 %
Lymphocytes Relative: 37 %
Lymphs Abs: 2.1 10*3/uL (ref 0.7–4.0)
MCH: 26.5 pg (ref 26.0–34.0)
MCHC: 29.6 g/dL — ABNORMAL LOW (ref 30.0–36.0)
MCV: 89.7 fL (ref 80.0–100.0)
Monocytes Absolute: 0.6 10*3/uL (ref 0.1–1.0)
Monocytes Relative: 10 %
Neutro Abs: 2.8 10*3/uL (ref 1.7–7.7)
Neutrophils Relative %: 48 %
Platelets: 297 10*3/uL (ref 150–400)
RBC: 4.26 MIL/uL (ref 3.87–5.11)
RDW: 15.9 % — ABNORMAL HIGH (ref 11.5–15.5)
WBC: 5.8 10*3/uL (ref 4.0–10.5)
nRBC: 0 % (ref 0.0–0.2)

## 2020-01-29 LAB — SURGICAL PCR SCREEN
MRSA, PCR: NEGATIVE
Staphylococcus aureus: NEGATIVE

## 2020-01-29 LAB — SARS CORONAVIRUS 2 (TAT 6-24 HRS): SARS Coronavirus 2: NEGATIVE

## 2020-01-29 NOTE — Progress Notes (Signed)
   01/29/20 1119  OBSTRUCTIVE SLEEP APNEA  Have you ever been diagnosed with sleep apnea through a sleep study? No  Do you snore loudly (loud enough to be heard through closed doors)?  1  Do you often feel tired, fatigued, or sleepy during the daytime (such as falling asleep during driving or talking to someone)? 0  Has anyone observed you stop breathing during your sleep? 1  Do you have, or are you being treated for high blood pressure? 1  BMI more than 35 kg/m2? 1  Age > 50 (1-yes) 1  Neck circumference greater than:Female 16 inches or larger, Female 17inches or larger? 0  Female Gender (Yes=1) 0  Obstructive Sleep Apnea Score 5  Score 5 or greater  Results sent to PCP

## 2020-01-29 NOTE — Progress Notes (Signed)
PCP - Dr. Pricilla Holm Cardiologist - Dr. Ocie Doyne Camnitz GI: Dr. Wilfrid Lund  PPM/ICD - Denies  Chest x-ray - 10/09/18 EKG - 10/07/19 Stress Test - 09/2019 ECHO - 10/20/19 Cardiac Cath - 05/2018  Sleep Study - Denies  Pt denies being diabetic.   Blood Thinner Instructions: LD of Xarelto was 01/27/20 Aspirin Instructions: N/A  ERAS Protcol - No  COVID TEST- Scheduled 01/29/20   Coronavirus Screening  Have you experienced the following symptoms:  Cough yes/no: No Fever (>100.82F)  yes/no: No Runny nose yes/no: No Sore throat yes/no: No Difficulty breathing/shortness of breath  yes/no: No  Have you or a family member traveled in the last 14 days and where? yes/no: No   If the patient indicates "YES" to the above questions, their PAT will be rescheduled to limit the exposure to others and, the surgeon will be notified. THE PATIENT WILL NEED TO BE ASYMPTOMATIC FOR 14 DAYS.   If the patient is not experiencing any of these symptoms, the PAT nurse will instruct them to NOT bring anyone with them to their appointment since they may have these symptoms or traveled as well.   Please remind your patients and families that hospital visitation restrictions are in effect and the importance of the restrictions.     Anesthesia review: Yes, cardiac hx  Patient denies shortness of breath, fever, cough and chest pain at PAT appointment   All instructions explained to the patient, with a verbal understanding of the material. Patient agrees to go over the instructions while at home for a better understanding. Patient also instructed to self quarantine after being tested for COVID-19. The opportunity to ask questions was provided.

## 2020-01-30 ENCOUNTER — Encounter (HOSPITAL_COMMUNITY): Payer: Self-pay

## 2020-01-30 ENCOUNTER — Telehealth: Payer: Self-pay | Admitting: Podiatry

## 2020-01-30 NOTE — Telephone Encounter (Signed)
My account number is [id number] and I'm calling about the $203 bill that I owe. That's the only statement I got but when I call to pay on the bill it says I owe $250 some dollars. The only bill I got is for $203, I'm not understanding why this is going up. Somebody please call and tell me what's going on. Thank you.

## 2020-01-30 NOTE — Progress Notes (Signed)
Anesthesia Chart Review:  Case: [id number] Date/Time: 02/02/20 0745   Procedure: ACDF - C3-C4 - C4-C5 - C5-C6 - C6-C7 (N/A )   Anesthesia type: General   Pre-op diagnosis: Stenosis   [facility] RO[facility] / [facility]   Surgeons: Earnie Larsson, MD      DISCUSSION: Patient is a 69 year old female scheduled for the above procedure.   History includes never smoker, HTN, HLD, PAF (by report, other records available), MI ("due to atrial fib" 2000), PE (12/2012, post left THR), clotting disorder (not specified), CHF, dyspnea, thoracic aortic intramural hematoma (05/2018; left subclavian to carotid artery transposition, TEVAR with left SCA coverage, left CCA stent, right CFA [id number], complicated by lymphatic duct leak, s/p wound exploration, chest tube), migraines, anemia. OSA screening score was 5.   Last evaluation by cardiology with Tommye Standard, PA-C on 11/18/19 for follow-up and preoperative evaluation for future bunion and c-spine surgeries. She wrote:  "Pre-op    Bunion Dr. Paulla Dolly    C-spine fusion, Dr. Annette Stable    Low cardiac risk for surgeries planned    OK to hold xarelto 3 days pre-op, to resume as per surgeon's post op" Patient reported last Xarelto was 4.6/21.    Preoperative COVID-19 test negative on 01/29/20.  Anesthesia team to evaluate on the day of surgery.    VS: BP (!) 154/84   Pulse 95   Temp 36.9 C (Oral)   Resp 17   Ht 5\' 4"  (1.626 m)   Wt 74.7 kg   SpO2 99%   BMI 28.25 kg/m     PROVIDERS: Hoyt Koch, MD is PCP Allegra Lai, MD is cardiologist. Last visit 11/18/19 with Tommye Standard, PA-C. Servando Snare, MD is vascular surgeon Wilfrid Lund, MD is GI Claudia Desanctis Leonia Reader, MD is urologist   LABS: Labs reviewed: Acceptable for surgery. (all labs ordered are listed, but only abnormal results are displayed)  Labs Reviewed  CBC WITH DIFFERENTIAL/PLATELET - Abnormal; Notable for the following components:      Result Value   Hemoglobin 11.3 (*)    MCHC 29.6 (*)    RDW  15.9 (*)    All other components within normal limits  SURGICAL PCR SCREEN  BASIC METABOLIC PANEL  PROTIME-INR     IMAGES: CTA Chest/abd/pelvis 05/09/19: IMPRESSION: 1. Stable stent graft from distal arch through descending thoracic aorta. 2. Patent left carotid stent and bypass graft to subclavian. 3. Stable 1.2 cm fusiform dilatation of the mid left internal iliac artery. 4. Coronary calcifications. 5. Sigmoid diverticulosis. 6. Additional ancillary findings as above. No acute or significant interval change.   EKG: 10/07/19: SR, LAD   CV:  Echo 10/20/19: IMPRESSIONS  1. Left ventricular ejection fraction, by visual estimation, is 60 to  65%. The left ventricle has normal function. There is no left ventricular  hypertrophy.  2. Elevated left ventricular end-diastolic pressure.  3. Left ventricular diastolic parameters are consistent with Grade I  diastolic dysfunction (impaired relaxation).  4. The left ventricle has no regional wall motion abnormalities.  5. Global right ventricle has normal systolic function.The right  ventricular size is normal. No increase in right ventricular wall  thickness.  6. Left atrial size was mildly dilated.  7. Right atrial size was normal.  8. The mitral valve is normal in structure. Trivial mitral valve  regurgitation. No evidence of mitral stenosis.  9. The tricuspid valve is normal in structure.  10. The aortic valve is normal in structure. Aortic valve regurgitation  is  not visualized. No evidence of aortic valve sclerosis or stenosis.  11. The pulmonic valve was normal in structure. Pulmonic valve  regurgitation is not visualized.  12. Normal pulmonary artery systolic pressure.  13. The tricuspid regurgitant velocity is 2.12 m/s, and with an assumed  right atrial pressure of 3 mmHg, the estimated right ventricular systolic  pressure is normal at 21.0 mmHg.  14. The inferior vena cava is normal in size with greater than  50%  respiratory variability, suggesting right atrial pressure of 3 mmHg.    Nuclear stress tet 10/20/19:  Nuclear stress EF: 66%.  There was no ST segment deviation noted during stress.  No T wave inversion was noted during stress.  The study is normal.  This is a low risk study. No ischemia.  The left ventricular ejection fraction is hyperdynamic (>65%).    Carotid US 06/11/18: Final Interpretation:  - Right Carotid: Velocities in the right ICA are consistent with a 40-59% stenosis.  - Left Carotid: Velocities in the left ICA are consistent with a 1-39% stenosis.  - Vertebrals: Bilateral vertebral arteries demonstrate antegrade flow.    Past Medical History:  Diagnosis Date  . Allergy   . Anemia   . Arthritis    "qwhere" (07/29/2018)  . Atrial fibrillation ([place])   . CHF (congestive heart failure) ([place])   . Clotting disorder ([place])   . Degenerative arthritis of hip    s/p L THR 12/2012  . Dyspnea    since surgery in August 2019- "when I get worked up and walk a short distance"  . Dysrhythmia   . History of blood transfusion 05/2018   "related to OR"  . Hyperlipidemia   . Hypertension   . Intramural hematoma of thoracic aorta ([place]) 06/13/2018  . Migraines    mIgraines- none since blood pressure and lipids are under control  . Myocardial infarction ([place]) 2000   due to atrial fib  . Neuromuscular disorder ([place])    pinched nerve- left side of neck  . Pulmonary emboli ([place]) 12/2012 dx   post op (L THR), anticoag x 70mo    Past Surgical History:  Procedure Laterality Date  . ABDOMINAL HYSTERECTOMY     "w/1 tube"  . ARTERY REPAIR Left 08/15/2018   Procedure: LEFT BRACHIAL ARTERY EXPLORATION;  Surgeon: Waynetta Sandy, MD;  Location: [facility];  Service: Vascular;  Laterality: Left;  . Breast Ultrasound Left 04/16/13   Done @ breast center Impression: no malignancy appearance noted on the screen study is consistent with a summation shadow  . CARDIAC  CATHETERIZATION    . CAROTID-SUBCLAVIAN BYPASS GRAFT Left 06/13/2018   Procedure: BYPASS GRAFT CAROTID-SUBCLAVIAN;  Surgeon: Waynetta Sandy, MD;  Location: [facility];  Service: Vascular;  Laterality: Left;  . COLONOSCOPY    . DG TUMB RIGHT HAND Right    cyst removal  . IR THORACENTESIS ASP PLEURAL SPACE W/IMG GUIDE  05/31/2018  . JOINT REPLACEMENT    . THORACIC AORTIC ENDOVASCULAR STENT GRAFT Left 06/13/2018   Procedure: LEFT  SUBCLAVIAN TO CAROTID ARTERY TRANSPOSITION; THORACIC AORTIC ENDOVASCULAR STENT GRAFT using GORE CONFORMABLE THORACIC STENT GRAFT AND GORE TAG THORACIC ENDOPROSTHESIS; STENT LEFT COMMON CAROTID ARTERY, RIGHT COMMON-FEMORAL ENDARTERECTOMY WITH PATCH ANGIOPLASTY;  Surgeon: Waynetta Sandy, MD;  Location: [facility];  Service: Vascular;  Laterality: Left;  . TONSILLECTOMY    . TOTAL HIP ARTHROPLASTY Left 01/03/2013   Procedure: TOTAL HIP ARTHROPLASTY ANTERIOR APPROACH;  Surgeon: Mcarthur Rossetti, MD;  Location: [facility];  Service: Orthopedics;  Laterality: Left;  Left Total Hip Arthroplasty, Anterior Approach  . TUBAL LIGATION    . VAGINA RECONSTRUCTION SURGERY  1966   vagina had closed up when 69 years old  . WOUND EXPLORATION Left 06/16/2018   Procedure: LEFT NECK WOUND EXPLORATION, CHEST TUBE INSERTION;  Surgeon: Waynetta Sandy, MD;  [facility];  Service: Vascular;  Laterality: Left;    MEDICATIONS: . acetaminophen (TYLENOL) 500 MG tablet  . aspirin EC 81 MG tablet  . digoxin (LANOXIN) 0.25 MG tablet  . diltiazem (DILACOR XR) 240 MG 24 hr capsule  . ferrous sulfate 325 (65 FE) MG tablet  . furosemide (LASIX) 20 MG tablet  . Multiple Vitamin (MULTIVITAMIN) tablet  . ondansetron (ZOFRAN) 4 MG tablet  . oxyCODONE-acetaminophen (PERCOCET) 10-325 MG tablet  . rosuvastatin (CRESTOR) 20 MG tablet  . XARELTO 20 MG TABS tablet   No current facility-administered medications for this encounter.    Myra Gianotti, PA-C Surgical Short  Stay/Anesthesiology [facility] Phone [phone] [facility] Phone [phone] 01/30/2020 10:52 AM

## 2020-01-30 NOTE — Anesthesia Preprocedure Evaluation (Addendum)
Anesthesia Evaluation  Patient identified by MRN, date of birth, ID band Patient awake    Reviewed: Allergy & Precautions, NPO status , Patient's Chart, lab work & pertinent test results  Airway Mallampati: II  TM Distance: >3 FB     Dental   Pulmonary    breath sounds clear to auscultation       Cardiovascular hypertension, + Past MI and +CHF  + dysrhythmias  Rhythm:Regular Rate:Normal     Neuro/Psych  Headaches,  Neuromuscular disease    GI/Hepatic negative GI ROS,   Endo/Other  negative endocrine ROS  Renal/GU negative Renal ROS     Musculoskeletal  (+) Arthritis ,   Abdominal   Peds  Hematology  (+) anemia ,   Anesthesia Other Findings   Reproductive/Obstetrics                           Anesthesia Physical Anesthesia Plan  ASA: III  Anesthesia Plan: General   Post-op Pain Management:    Induction: Intravenous  PONV Risk Score and Plan: 3 and Ondansetron, Dexamethasone and Midazolam  Airway Management Planned: Oral ETT  Additional Equipment:   Intra-op Plan:   Post-operative Plan: Possible Post-op intubation/ventilation  Informed Consent: I have reviewed the patients History and Physical, chart, labs and discussed the procedure including the risks, benefits and alternatives for the proposed anesthesia with the patient or authorized representative who has indicated his/her understanding and acceptance.     Dental advisory given  Plan Discussed with: Anesthesiologist and CRNA  Anesthesia Plan Comments: (PAT note written 01/30/2020 by Myra Gianotti, PA-C. )      Anesthesia Quick Evaluation

## 2020-02-02 ENCOUNTER — Inpatient Hospital Stay (HOSPITAL_COMMUNITY): Payer: Medicare Other

## 2020-02-02 ENCOUNTER — Inpatient Hospital Stay (HOSPITAL_COMMUNITY): Payer: Medicare Other | Admitting: Vascular Surgery

## 2020-02-02 ENCOUNTER — Observation Stay (HOSPITAL_COMMUNITY)
Admission: RE | Admit: 2020-02-02 | Discharge: 2020-02-03 | Disposition: A | Discharge status: Home / Self Care | Payer: Medicare Other | Attending: Neurosurgery | Admitting: Neurosurgery

## 2020-02-02 ENCOUNTER — Encounter (HOSPITAL_COMMUNITY)
Admission: RE | Disposition: A | Discharge status: Home / Self Care | Payer: Self-pay | Source: Home / Self Care | Attending: Neurosurgery

## 2020-02-02 ENCOUNTER — Other Ambulatory Visit: Payer: Self-pay

## 2020-02-02 ENCOUNTER — Encounter (HOSPITAL_COMMUNITY): Payer: Self-pay | Admitting: Neurosurgery

## 2020-02-02 DIAGNOSIS — M1610 Unilateral primary osteoarthritis, unspecified hip: Secondary | ICD-10-CM | POA: Diagnosis not present

## 2020-02-02 DIAGNOSIS — M4322 Fusion of spine, cervical region: Secondary | ICD-10-CM | POA: Diagnosis not present

## 2020-02-02 DIAGNOSIS — I509 Heart failure, unspecified: Secondary | ICD-10-CM | POA: Diagnosis not present

## 2020-02-02 DIAGNOSIS — M5001 Cervical disc disorder with myelopathy,  high cervical region: Secondary | ICD-10-CM | POA: Diagnosis not present

## 2020-02-02 DIAGNOSIS — Z886 Allergy status to analgesic agent status: Secondary | ICD-10-CM | POA: Insufficient documentation

## 2020-02-02 DIAGNOSIS — Z880 Allergy status to penicillin: Secondary | ICD-10-CM | POA: Diagnosis not present

## 2020-02-02 DIAGNOSIS — M4802 Spinal stenosis, cervical region: Principal | ICD-10-CM | POA: Insufficient documentation

## 2020-02-02 DIAGNOSIS — G709 Myoneural disorder, unspecified: Secondary | ICD-10-CM | POA: Diagnosis not present

## 2020-02-02 DIAGNOSIS — Z87892 Personal history of anaphylaxis: Secondary | ICD-10-CM | POA: Diagnosis not present

## 2020-02-02 DIAGNOSIS — M4722 Other spondylosis with radiculopathy, cervical region: Secondary | ICD-10-CM | POA: Insufficient documentation

## 2020-02-02 DIAGNOSIS — D649 Anemia, unspecified: Secondary | ICD-10-CM | POA: Diagnosis not present

## 2020-02-02 DIAGNOSIS — Z91013 Allergy to seafood: Secondary | ICD-10-CM | POA: Insufficient documentation

## 2020-02-02 DIAGNOSIS — Z86711 Personal history of pulmonary embolism: Secondary | ICD-10-CM | POA: Insufficient documentation

## 2020-02-02 DIAGNOSIS — Z7982 Long term (current) use of aspirin: Secondary | ICD-10-CM | POA: Insufficient documentation

## 2020-02-02 DIAGNOSIS — Z79899 Other long term (current) drug therapy: Secondary | ICD-10-CM | POA: Insufficient documentation

## 2020-02-02 DIAGNOSIS — M5011 Cervical disc disorder with radiculopathy,  high cervical region: Secondary | ICD-10-CM | POA: Diagnosis not present

## 2020-02-02 DIAGNOSIS — E785 Hyperlipidemia, unspecified: Secondary | ICD-10-CM | POA: Insufficient documentation

## 2020-02-02 DIAGNOSIS — Z419 Encounter for procedure for purposes other than remedying health state, unspecified: Secondary | ICD-10-CM

## 2020-02-02 DIAGNOSIS — M4712 Other spondylosis with myelopathy, cervical region: Secondary | ICD-10-CM | POA: Diagnosis not present

## 2020-02-02 DIAGNOSIS — Z885 Allergy status to narcotic agent status: Secondary | ICD-10-CM | POA: Diagnosis not present

## 2020-02-02 DIAGNOSIS — M50023 Cervical disc disorder at C6-C7 level with myelopathy: Secondary | ICD-10-CM | POA: Diagnosis not present

## 2020-02-02 DIAGNOSIS — I110 Hypertensive heart disease with heart failure: Secondary | ICD-10-CM | POA: Diagnosis not present

## 2020-02-02 DIAGNOSIS — I4891 Unspecified atrial fibrillation: Secondary | ICD-10-CM | POA: Diagnosis not present

## 2020-02-02 DIAGNOSIS — Z7901 Long term (current) use of anticoagulants: Secondary | ICD-10-CM | POA: Diagnosis not present

## 2020-02-02 DIAGNOSIS — M5010 Cervical disc disorder with radiculopathy, unspecified cervical region: Secondary | ICD-10-CM | POA: Insufficient documentation

## 2020-02-02 DIAGNOSIS — Z96642 Presence of left artificial hip joint: Secondary | ICD-10-CM | POA: Insufficient documentation

## 2020-02-02 DIAGNOSIS — I252 Old myocardial infarction: Secondary | ICD-10-CM | POA: Insufficient documentation

## 2020-02-02 DIAGNOSIS — Z91018 Allergy to other foods: Secondary | ICD-10-CM | POA: Diagnosis not present

## 2020-02-02 HISTORY — PX: ANTERIOR CERVICAL DECOMPRESSION/DISCECTOMY FUSION 4 LEVELS: SHX5556

## 2020-02-02 SURGERY — ANTERIOR CERVICAL DECOMPRESSION/DISCECTOMY FUSION 4 LEVELS
Anesthesia: General | Site: Spine Cervical

## 2020-02-02 MED ORDER — THROMBIN 5000 UNITS EX SOLR
CUTANEOUS | Status: AC
  Filled 2020-02-02: qty 5000

## 2020-02-02 MED ORDER — FENTANYL CITRATE (PF) 100 MCG/2ML IJ SOLN
INTRAMUSCULAR | Status: DC | PRN
  Administered 2020-02-02 (×2): 50 ug via INTRAVENOUS
  Administered 2020-02-02: 100 ug via INTRAVENOUS
  Administered 2020-02-02: 50 ug via INTRAVENOUS

## 2020-02-02 MED ORDER — 0.9 % SODIUM CHLORIDE (POUR BTL) OPTIME
TOPICAL | Status: DC | PRN
  Administered 2020-02-02: 09:00:00 1000 mL

## 2020-02-02 MED ORDER — ADULT MULTIVITAMIN W/MINERALS CH
2.0000 | ORAL_TABLET | Freq: Every day | ORAL | Status: DC
  Administered 2020-02-02: 2 via ORAL
  Filled 2020-02-02 (×2): qty 2

## 2020-02-02 MED ORDER — PHENYLEPHRINE HCL (PRESSORS) 10 MG/ML IV SOLN
INTRAVENOUS | Status: DC | PRN
  Administered 2020-02-02 (×2): 80 ug via INTRAVENOUS

## 2020-02-02 MED ORDER — VANCOMYCIN HCL 1250 MG/250ML IV SOLN
1250.0000 mg | INTRAVENOUS | Status: DC
  Administered 2020-02-03: 05:00:00 1250 mg via INTRAVENOUS
  Filled 2020-02-02: qty 250

## 2020-02-02 MED ORDER — SUGAMMADEX SODIUM 200 MG/2ML IV SOLN
INTRAVENOUS | Status: DC | PRN
  Administered 2020-02-02: 200 mg via INTRAVENOUS

## 2020-02-02 MED ORDER — ROCURONIUM BROMIDE 10 MG/ML (PF) SYRINGE
PREFILLED_SYRINGE | INTRAVENOUS | Status: AC
  Filled 2020-02-02: qty 10

## 2020-02-02 MED ORDER — PROPOFOL 10 MG/ML IV BOLUS
INTRAVENOUS | Status: DC | PRN
  Administered 2020-02-02: 140 mg via INTRAVENOUS

## 2020-02-02 MED ORDER — FENTANYL CITRATE (PF) 100 MCG/2ML IJ SOLN
25.0000 ug | INTRAMUSCULAR | Status: DC | PRN
  Administered 2020-02-02: 50 ug via INTRAVENOUS

## 2020-02-02 MED ORDER — ONDANSETRON HCL 4 MG/2ML IJ SOLN
INTRAMUSCULAR | Status: AC
  Filled 2020-02-02: qty 2

## 2020-02-02 MED ORDER — OXYCODONE-ACETAMINOPHEN 10-325 MG PO TABS: 1.0000 | ORAL_TABLET | Freq: Four times a day (QID) | ORAL | Status: DC | PRN

## 2020-02-02 MED ORDER — SODIUM CHLORIDE 0.9 % IV SOLN: 250.0000 mL | INTRAVENOUS | Status: DC

## 2020-02-02 MED ORDER — LIDOCAINE 2% (20 MG/ML) 5 ML SYRINGE
INTRAMUSCULAR | Status: DC | PRN
  Administered 2020-02-02: 40 mg via INTRAVENOUS
  Administered 2020-02-02: 60 mg via INTRAVENOUS

## 2020-02-02 MED ORDER — SODIUM CHLORIDE 0.9% FLUSH: 3.0000 mL | INTRAVENOUS | Status: DC | PRN

## 2020-02-02 MED ORDER — PROPOFOL 10 MG/ML IV BOLUS
INTRAVENOUS | Status: AC
  Filled 2020-02-02: qty 40

## 2020-02-02 MED ORDER — SODIUM CHLORIDE 0.9% FLUSH
3.0000 mL | Freq: Two times a day (BID) | INTRAVENOUS | Status: DC
  Administered 2020-02-02 (×2): 3 mL via INTRAVENOUS

## 2020-02-02 MED ORDER — DEXAMETHASONE SODIUM PHOSPHATE 10 MG/ML IJ SOLN
10.0000 mg | Freq: Once | INTRAMUSCULAR | Status: DC
  Filled 2020-02-02: qty 1

## 2020-02-02 MED ORDER — OXYCODONE-ACETAMINOPHEN 5-325 MG PO TABS: 1.0000 | ORAL_TABLET | Freq: Four times a day (QID) | ORAL | Status: DC | PRN

## 2020-02-02 MED ORDER — LACTATED RINGERS IV SOLN: INTRAVENOUS | Status: DC | PRN

## 2020-02-02 MED ORDER — ROSUVASTATIN CALCIUM 20 MG PO TABS
20.0000 mg | ORAL_TABLET | Freq: Every day | ORAL | Status: DC
  Administered 2020-02-02 – 2020-02-03 (×2): 20 mg via ORAL
  Filled 2020-02-02 (×2): qty 1

## 2020-02-02 MED ORDER — ONDANSETRON HCL 4 MG/2ML IJ SOLN
INTRAMUSCULAR | Status: DC | PRN
  Administered 2020-02-02: 4 mg via INTRAVENOUS

## 2020-02-02 MED ORDER — DILTIAZEM HCL ER COATED BEADS 240 MG PO CP24
240.0000 mg | ORAL_CAPSULE | Freq: Every day | ORAL | Status: DC
  Administered 2020-02-02 – 2020-02-03 (×2): 240 mg via ORAL
  Filled 2020-02-02: qty 2
  Filled 2020-02-02 (×2): qty 1

## 2020-02-02 MED ORDER — ONDANSETRON HCL 4 MG PO TABS: 4.0000 mg | ORAL_TABLET | Freq: Four times a day (QID) | ORAL | Status: DC | PRN

## 2020-02-02 MED ORDER — CHLORHEXIDINE GLUCONATE CLOTH 2 % EX PADS: 6.0000 | MEDICATED_PAD | Freq: Once | CUTANEOUS | Status: DC

## 2020-02-02 MED ORDER — SODIUM CHLORIDE 0.9 % IV SOLN: INTRAVENOUS | Status: DC | PRN

## 2020-02-02 MED ORDER — ACETAMINOPHEN 650 MG RE SUPP: 650.0000 mg | RECTAL | Status: DC | PRN

## 2020-02-02 MED ORDER — FERROUS SULFATE 325 (65 FE) MG PO TABS
325.0000 mg | ORAL_TABLET | ORAL | Status: DC
  Administered 2020-02-02: 325 mg via ORAL
  Filled 2020-02-02: qty 1

## 2020-02-02 MED ORDER — HYDROCODONE-ACETAMINOPHEN 5-325 MG PO TABS
1.0000 | ORAL_TABLET | ORAL | Status: DC | PRN
  Administered 2020-02-03: 1 via ORAL
  Filled 2020-02-02: qty 1

## 2020-02-02 MED ORDER — DIGOXIN 125 MCG PO TABS
0.1250 mg | ORAL_TABLET | Freq: Every morning | ORAL | Status: DC
  Administered 2020-02-02 – 2020-02-03 (×2): 0.125 mg via ORAL
  Filled 2020-02-02 (×2): qty 1

## 2020-02-02 MED ORDER — ACETAMINOPHEN 325 MG PO TABS: 650.0000 mg | ORAL_TABLET | ORAL | Status: DC | PRN

## 2020-02-02 MED ORDER — THROMBIN 20000 UNITS EX SOLR
CUTANEOUS | Status: AC
  Filled 2020-02-02: qty 20000

## 2020-02-02 MED ORDER — HYDROCODONE-ACETAMINOPHEN 10-325 MG PO TABS
2.0000 | ORAL_TABLET | ORAL | Status: DC | PRN
  Administered 2020-02-02 (×3): 2 via ORAL
  Administered 2020-02-03: 1 via ORAL
  Filled 2020-02-02 (×4): qty 2

## 2020-02-02 MED ORDER — SODIUM CHLORIDE 0.9 % IV SOLN
INTRAVENOUS | Status: DC | PRN
  Administered 2020-02-02: 08:00:00 25 ug/min via INTRAVENOUS

## 2020-02-02 MED ORDER — SUCCINYLCHOLINE 20MG/ML (10ML) SYRINGE FOR MEDFUSION PUMP - OPTIME
INTRAMUSCULAR | Status: DC | PRN
  Administered 2020-02-02: 120 mg via INTRAVENOUS

## 2020-02-02 MED ORDER — MIDAZOLAM HCL 5 MG/5ML IJ SOLN
INTRAMUSCULAR | Status: DC | PRN
  Administered 2020-02-02: 2 mg via INTRAVENOUS

## 2020-02-02 MED ORDER — ONDANSETRON HCL 4 MG/2ML IJ SOLN: 4.0000 mg | Freq: Four times a day (QID) | INTRAMUSCULAR | Status: DC | PRN

## 2020-02-02 MED ORDER — FENTANYL CITRATE (PF) 100 MCG/2ML IJ SOLN
INTRAMUSCULAR | Status: AC
  Filled 2020-02-02: qty 2

## 2020-02-02 MED ORDER — HYDROMORPHONE HCL 1 MG/ML IJ SOLN: 1.0000 mg | INTRAMUSCULAR | Status: DC | PRN

## 2020-02-02 MED ORDER — DEXAMETHASONE SODIUM PHOSPHATE 10 MG/ML IJ SOLN
INTRAMUSCULAR | Status: AC
  Filled 2020-02-02: qty 1

## 2020-02-02 MED ORDER — VANCOMYCIN HCL IN DEXTROSE 1-5 GM/200ML-% IV SOLN
1000.0000 mg | INTRAVENOUS | Status: AC
  Administered 2020-02-02: 1000 mg via INTRAVENOUS
  Filled 2020-02-02: qty 200

## 2020-02-02 MED ORDER — CYCLOBENZAPRINE HCL 10 MG PO TABS: 10.0000 mg | ORAL_TABLET | Freq: Three times a day (TID) | ORAL | Status: DC | PRN

## 2020-02-02 MED ORDER — OXYCODONE HCL 5 MG PO TABS: 5.0000 mg | ORAL_TABLET | Freq: Four times a day (QID) | ORAL | Status: DC | PRN

## 2020-02-02 MED ORDER — ROCURONIUM BROMIDE 50 MG/5ML IV SOSY
PREFILLED_SYRINGE | INTRAVENOUS | Status: DC | PRN
  Administered 2020-02-02: 50 mg via INTRAVENOUS
  Administered 2020-02-02: 30 mg via INTRAVENOUS
  Administered 2020-02-02: 20 mg via INTRAVENOUS

## 2020-02-02 MED ORDER — THROMBIN 20000 UNITS EX SOLR: CUTANEOUS | Status: DC | PRN

## 2020-02-02 MED ORDER — MIDAZOLAM HCL 2 MG/2ML IJ SOLN
INTRAMUSCULAR | Status: AC
  Filled 2020-02-02: qty 2

## 2020-02-02 MED ORDER — THROMBIN 5000 UNITS EX SOLR: OROMUCOSAL | Status: DC | PRN

## 2020-02-02 MED ORDER — LIDOCAINE 2% (20 MG/ML) 5 ML SYRINGE
INTRAMUSCULAR | Status: AC
  Filled 2020-02-02: qty 5

## 2020-02-02 MED ORDER — FUROSEMIDE 20 MG PO TABS
20.0000 mg | ORAL_TABLET | Freq: Every day | ORAL | Status: DC
  Administered 2020-02-02: 20 mg via ORAL
  Filled 2020-02-02 (×2): qty 1

## 2020-02-02 MED ORDER — MENTHOL 3 MG MT LOZG
1.0000 | LOZENGE | OROMUCOSAL | Status: DC | PRN
  Filled 2020-02-02: qty 9

## 2020-02-02 MED ORDER — FENTANYL CITRATE (PF) 250 MCG/5ML IJ SOLN
INTRAMUSCULAR | Status: AC
  Filled 2020-02-02: qty 5

## 2020-02-02 MED ORDER — PHENOL 1.4 % MT LIQD: 1.0000 | OROMUCOSAL | Status: DC | PRN

## 2020-02-02 MED ORDER — ONDANSETRON HCL 4 MG PO TABS
4.0000 mg | ORAL_TABLET | Freq: Three times a day (TID) | ORAL | Status: DC | PRN
  Administered 2020-02-03: 4 mg via ORAL
  Filled 2020-02-02: qty 1

## 2020-02-02 SURGICAL SUPPLY — 63 items
ADH SKN CLS LQ APL DERMABOND (GAUZE/BANDAGES/DRESSINGS) ×1
APL SKNCLS STERI-STRIP NONHPOA (GAUZE/BANDAGES/DRESSINGS) ×1
BAG DECANTER FOR FLEXI CONT (MISCELLANEOUS) ×2 IMPLANT
BAND INSRT 18 STRL LF DISP RB (MISCELLANEOUS) ×2
BAND RUBBER #18 3X1/16 STRL (MISCELLANEOUS) ×4 IMPLANT
BENZOIN TINCTURE PRP APPL 2/3 (GAUZE/BANDAGES/DRESSINGS) ×2 IMPLANT
BIT DRILL 13 (BIT) ×1 IMPLANT
BUR MATCHSTICK NEURO 3.0 LAGG (BURR) ×2 IMPLANT
CAGE PEEK 6X14X11 (Cage) ×8 IMPLANT
CANISTER SUCT 3000ML PPV (MISCELLANEOUS) ×2 IMPLANT
CARTRIDGE OIL MAESTRO DRILL (MISCELLANEOUS) ×1 IMPLANT
DERMABOND ADHESIVE PROPEN (GAUZE/BANDAGES/DRESSINGS) ×1
DERMABOND ADVANCED .7 DNX6 (GAUZE/BANDAGES/DRESSINGS) IMPLANT
DIFFUSER DRILL AIR PNEUMATIC (MISCELLANEOUS) ×2 IMPLANT
DRAPE C-ARM 42X72 X-RAY (DRAPES) ×4 IMPLANT
DRAPE LAPAROTOMY 100X72 PEDS (DRAPES) ×2 IMPLANT
DRAPE MICROSCOPE LEICA (MISCELLANEOUS) ×2 IMPLANT
DRSG OPSITE POSTOP 4X6 (GAUZE/BANDAGES/DRESSINGS) ×1 IMPLANT
DURAPREP 6ML APPLICATOR 50/CS (WOUND CARE) ×2 IMPLANT
ELECT COATED BLADE 2.86 ST (ELECTRODE) ×2 IMPLANT
ELECT REM PT RETURN 9FT ADLT (ELECTROSURGICAL) ×2
ELECTRODE REM PT RTRN 9FT ADLT (ELECTROSURGICAL) ×1 IMPLANT
EVACUATOR 1/8 PVC DRAIN (DRAIN) ×1 IMPLANT
GAUZE 4X4 16PLY RFD (DISPOSABLE) IMPLANT
GAUZE SPONGE 4X4 12PLY STRL (GAUZE/BANDAGES/DRESSINGS) ×2 IMPLANT
GLOVE BIOGEL PI IND STRL 6.5 (GLOVE) ×1 IMPLANT
GLOVE BIOGEL PI IND STRL 7.5 (GLOVE) IMPLANT
GLOVE BIOGEL PI INDICATOR 6.5 (GLOVE) ×1
GLOVE BIOGEL PI INDICATOR 7.5 (GLOVE) ×2
GLOVE SS N UNI LF 7.5 STRL (GLOVE) ×2 IMPLANT
GLOVE SS PI 9.0 STRL (GLOVE) ×2 IMPLANT
GLOVE SURG SS PI 6.5 STRL IVOR (GLOVE) ×3 IMPLANT
GLOVE SURG SS PI 7.0 STRL IVOR (GLOVE) ×2 IMPLANT
GOWN STRL REUS W/ TWL LRG LVL3 (GOWN DISPOSABLE) IMPLANT
GOWN STRL REUS W/ TWL XL LVL3 (GOWN DISPOSABLE) IMPLANT
GOWN STRL REUS W/TWL 2XL LVL3 (GOWN DISPOSABLE) IMPLANT
GOWN STRL REUS W/TWL LRG LVL3 (GOWN DISPOSABLE) ×6
GOWN STRL REUS W/TWL XL LVL3 (GOWN DISPOSABLE) ×8
HALTER HD/CHIN CERV TRACTION D (MISCELLANEOUS) ×2 IMPLANT
HEMOSTAT POWDER KIT SURGIFOAM (HEMOSTASIS) ×2 IMPLANT
KIT BASIN OR (CUSTOM PROCEDURE TRAY) ×2 IMPLANT
KIT TURNOVER KIT B (KITS) ×2 IMPLANT
NDL SPNL 20GX3.5 QUINCKE YW (NEEDLE) ×1 IMPLANT
NEEDLE SPNL 20GX3.5 QUINCKE YW (NEEDLE) ×2 IMPLANT
NS IRRIG 1000ML POUR BTL (IV SOLUTION) ×2 IMPLANT
OIL CARTRIDGE MAESTRO DRILL (MISCELLANEOUS) ×2
PACK LAMINECTOMY NEURO (CUSTOM PROCEDURE TRAY) ×2 IMPLANT
PLATE 4 75XNS SPNE CVD ANT T (Plate) IMPLANT
PLATE 4 ATLANTIS TRANS (Plate) ×2 IMPLANT
SCREW ST FIX 4 ATL 3120213 (Screw) ×10 IMPLANT
SPACER SPNL 11X14X6XPEEK CVD (Cage) IMPLANT
SPCR SPNL 11X14X6XPEEK CVD (Cage) ×4 IMPLANT
SPONGE INTESTINAL PEANUT (DISPOSABLE) ×2 IMPLANT
SPONGE SURGIFOAM ABS GEL 100 (HEMOSTASIS) ×2 IMPLANT
STRIP CLOSURE SKIN 1/2X4 (GAUZE/BANDAGES/DRESSINGS) ×2 IMPLANT
SUT VIC AB 3-0 SH 8-18 (SUTURE) ×2 IMPLANT
SUT VIC AB 4-0 RB1 18 (SUTURE) ×2 IMPLANT
TOWEL GREEN STERILE (TOWEL DISPOSABLE) ×2 IMPLANT
TOWEL GREEN STERILE FF (TOWEL DISPOSABLE) ×2 IMPLANT
TRAP SPECIMEN MUCOUS 40CC (MISCELLANEOUS) ×2 IMPLANT
TRAY FOL W/BAG SLVR 16FR STRL (SET/KITS/TRAYS/PACK) IMPLANT
TRAY FOLEY W/BAG SLVR 16FR LF (SET/KITS/TRAYS/PACK) ×2
WATER STERILE IRR 1000ML POUR (IV SOLUTION) ×2 IMPLANT

## 2020-02-02 NOTE — Transfer of Care (Signed)
Immediate Anesthesia Transfer of Care Note  Patient: Samantha Stevenson  Procedure(s) Performed: CERVICAL THREE-FOUR, CERVICAL FOUR-FIVE, CERVICAL FIVE-SIX, CERVICAL SIX-SEVEN ANTERIOR CERVICAL DECOMPRESSION/DISCECTOMY FUSION (N/A Spine Cervical)  Patient Location: PACU  Anesthesia Type:General  Level of Consciousness: awake, alert , oriented, patient cooperative and responds to stimulation  Airway & Oxygen Therapy: Patient Spontanous Breathing and Patient connected to face mask oxygen  Post-op Assessment: Report given to RN, Post -op Vital signs reviewed and stable and Patient moving all extremities X 4  Post vital signs: Reviewed and stable  Last Vitals:  Vitals Value Taken Time  BP 135/76 02/02/20 1132  Temp    Pulse 84 02/02/20 1133  Resp 17 02/02/20 1133  SpO2 100 % 02/02/20 1133  Vitals shown include unvalidated device data.  Last Pain:  Vitals:   02/02/20 0643  TempSrc:   PainSc: 3       Patients Stated Pain Goal: 3 ([id number])  Complications: No apparent anesthesia complications

## 2020-02-02 NOTE — Anesthesia Postprocedure Evaluation (Signed)
Anesthesia Post Note  Patient: Samantha Stevenson  Procedure(s) Performed: CERVICAL THREE-FOUR, CERVICAL FOUR-FIVE, CERVICAL FIVE-SIX, CERVICAL SIX-SEVEN ANTERIOR CERVICAL DECOMPRESSION/DISCECTOMY FUSION (N/A Spine Cervical)     Patient location during evaluation: PACU Anesthesia Type: General Level of consciousness: awake Pain management: pain level controlled Vital Signs Assessment: post-procedure vital signs reviewed and stable Cardiovascular status: stable Postop Assessment: no apparent nausea or vomiting Anesthetic complications: no    Last Vitals:  Vitals:   02/02/20 1251 02/02/20 1618  BP: 127/67 136/77  Pulse: 78 83  Resp: 18 16  Temp: 37 C 36.9 C  SpO2: 99% 99%    Last Pain:  Vitals:   02/02/20 1819  TempSrc:   PainSc: 7                  Haiden Clucas

## 2020-02-02 NOTE — Progress Notes (Signed)
Orthopedic Tech Progress Note Patient Details:  Samantha Stevenson [birth year] [id number] RN said patient has SOFT COLLAR Patient ID: Samantha Stevenson, female   DOB: 1951/02/02, 69 y.o.   MRN: [id number]   Samantha Stevenson 02/02/2020, 12:45 PM

## 2020-02-02 NOTE — Op Note (Signed)
Date of procedure: 02/02/2020  Date of dictation: Same  Service: Neurosurgery  Preoperative diagnosis: Cervical stenosis with myelopathy and radiculopathy  Postoperative diagnosis: Same  Procedure Name: C3-4, C4-5, C5-6 and C6-7 anterior cervical discectomy with interbody fusion utilizing interbody peek cages, locally harvested autograft, and anterior plate instrumentation.  Surgeon:Phill Steck A.Dru Primeau, M.D.  Asst. Surgeon: Maryan Rued, NP  Anesthesia: General  Indication: 69 year old female with severe neck pain and bilateral upper extremity symptoms left greater than right.  Work-up demonstrates evidence of severe multilevel cervical disc degeneration with associated spondylosis and severe central and neuroforaminal stenosis.  Patient is failed conservative management and she presents now for 4 level anterior cervical decompression and fusion in hopes of improving her symptoms.  Operative note: After induction of anesthesia, patient positioned supine with neck slightly extended and held in place with halter traction.  Patient's anterior cervical region prepped and draped sterilely.  Incision made overlying C5.  Dissection performed on the right.  Retractor was placed.  Fluoroscopy was used and levels were confirmed.  The spaces were incised at all 4 levels.  Discectomy was then performed using various instruments down to level of the posterior annulus.  Starting first at C3-4 remaining aspects of annulus and osteophytes removed down to the level of the posterior longitudinal ligament.  Posterior logical is not elevated and resected in piecemeal fashion.  A wide central decompression then performed undercutting the bodies of C3 and C4.  Decompression then proceeded each neural foramina.  Wide anterior foraminotomies were performed on the course exiting C4 nerve root bilaterally.  At this point a very thorough decompression had been achieved.  There was no evidence of injury to the thecal sac  or nerve roots.  Procedures then repeated at C4-5, C5-6 and C6-7 again without complication.  Wound was then irrigated with antibiotic solution.  [employer] anatomic peek cages packed with morselized locally harvested autograft were then impacted in place.  Each cage was recessed slightly from the anterior cortical margin.  Atlantis translational plate was then placed over the C3, C4, C5, C6, C7 levels.  This then attached under fluoroscopic guidance using 13 mm fixed angle screws to each in all 5 levels.  All 10 screws given final tightening found to be solidly within the bone.  Locking screws engaged in all levels.  Final images reveal good position of the cages and the hardware at the proper upper level with normal alignment of spine.  Wound was then irrigated one final time.  Hemostasis was assured with the bipolar cautery.  A medium Hemovac drain was left in the prevertebral space.  Wounds and closed in layers with Vicryl sutures.  Steri-Strips and sterile dressing were applied.  No apparent complications.  Patient tolerated the procedure well and she returned to the recovery room postop.

## 2020-02-02 NOTE — Brief Op Note (Signed)
02/02/2020  11:16 AM  PATIENT:  Samantha Stevenson  69 y.o. female  PRE-OPERATIVE DIAGNOSIS:  Stenosis  POST-OPERATIVE DIAGNOSIS:  Stenosis  PROCEDURE:  Procedure(s): CERVICAL THREE-FOUR, CERVICAL FOUR-FIVE, CERVICAL FIVE-SIX, CERVICAL SIX-SEVEN ANTERIOR CERVICAL DECOMPRESSION/DISCECTOMY FUSION (N/A)  SURGEON:  Surgeon(s) and Role:    * Earnie Larsson, MD - Primary    * Ostergard, Joyice Faster, MD - Assisting  PHYSICIAN ASSISTANT:   ASSISTANTSMearl Latin   ANESTHESIA:   general  EBL:  300 mL   BLOOD ADMINISTERED:none  DRAINS: (med) Hemovact drain(s) in the prevertebral space with  Suction Open   LOCAL MEDICATIONS USED:  NONE  SPECIMEN:  No Specimen  DISPOSITION OF SPECIMEN:  N/A  COUNTS:  YES  TOURNIQUET:  * No tourniquets in log *  DICTATION: .Dragon Dictation  PLAN OF CARE: Admit for overnight observation  PATIENT DISPOSITION:  PACU - hemodynamically stable.   Delay start of Pharmacological VTE agent (>24hrs) due to surgical blood loss or risk of bleeding: yes

## 2020-02-02 NOTE — Progress Notes (Signed)
Pharmacy Antibiotic Note  Samantha Stevenson is a 69 y.o. female admitted on 02/02/2020 with cervical stenosis s/p decompression/disecectomy fusion with drain in paravertebral space. Pharmacy has been consulted for vancomycin dosing until drain is removed due to penicillin allergy and hx of severe reaction.   Received vancomycin 1g pre-op.  4/12 Vancomycin 1250mg  Q 24 hr Scr used: 0.8 mg/dL Weight: 74.4 kg Vd coeff: 0.72 L/kg Est AUC: 437   Plan: Start vancomycin 1250mg  Q24 hr F/u when drain is removed then discontinue vancomycin F/u renal fx, levels as needed   Height: 5\' 4"  (162.6 cm) Weight: 74.4 kg (164 lb) IBW/kg (Calculated) : 54.7  Temp (24hrs), Avg:98.6 F (37 C), Min:98.4 F (36.9 C), Max:98.6 F (37 C)  Recent Labs  Lab 01/29/20 1134  WBC 5.8  CREATININE 0.76    Estimated Creatinine Clearance: 67.4 mL/min (by C-G formula based on SCr of 0.76 mg/dL).    Allergies  Allergen Reactions  . Penicillins Swelling and Other (See Comments)    Severe swelling PATIENT HAS HAD A PCN REACTION WITH IMMEDIATE RASH, FACIAL/TONGUE/THROAT SWELLING, SOB, OR LIGHTHEADEDNESS WITH HYPOTENSION:  #  #  YES  #  #  Has patient had a PCN reaction causing severe rash involving mucus membranes or skin necrosis: No PATIENT HAS HAD A PCN REACTION THAT REQUIRED HOSPITALIZATION:  #  #  YES  #  #  Has patient had a PCN reaction occurring within the last 10 years: No  . Shellfish Allergy Anaphylaxis  . Latex Hives and Rash  . Banana Nausea Only  . Oxycodone     Pt stated, "Made me feel really drowsy"  . Asa [Aspirin] Nausea And Vomiting    Makes stomach upset Asprin 325  . Celebrex [Celecoxib] Nausea And Vomiting    GI upset  . Nsaids Rash    Antimicrobials this admission: Vanco  4/12 >>    Thank you for allowing pharmacy to be a part of this patient's care.  Benetta Spar, PharmD, BCPS, BCCP Clinical Pharmacist  Please check AMION for all [place] phone numbers After 10:00 PM,  call [place] [phone]

## 2020-02-02 NOTE — H&P (Signed)
Samantha Stevenson is an 69 y.o. female.   Chief Complaint: Neck pain HPI: 69 year old female with chronic, progressive, debilitating neck pain with bilateral upper extremity symptoms left greater than right which have failed conservative management.  Work-up demonstrates evidence of severe cervical spondylosis with marked disc space collapse and significant cervical stenosis and significant cervical foraminal stenosis.  Patient presents now for multilevel anterior cervical decompression and fusion in hopes of improving her symptoms.  Past Medical History:  Diagnosis Date  . Allergy   . Anemia   . Arthritis    "qwhere" (07/29/2018)  . Atrial fibrillation ([place])   . CHF (congestive heart failure) ([place])   . Clotting disorder ([place])   . Degenerative arthritis of hip    s/p L THR 12/2012  . Dyspnea    since surgery in August 2019- "when I get worked up and walk a short distance"  . Dysrhythmia   . History of blood transfusion 05/2018   "related to OR"  . Hyperlipidemia   . Hypertension   . Intramural hematoma of thoracic aorta ([place]) 06/13/2018  . Migraines    mIgraines- none since blood pressure and lipids are under control  . Myocardial infarction ([place]) 2000   due to atrial fib  . Neuromuscular disorder ([place])    pinched nerve- left side of neck  . Pulmonary emboli ([place]) 12/2012 dx   post op (L THR), anticoag x 78mo    Past Surgical History:  Procedure Laterality Date  . ABDOMINAL HYSTERECTOMY     "w/1 tube"  . ARTERY REPAIR Left 08/15/2018   Procedure: LEFT BRACHIAL ARTERY EXPLORATION;  Surgeon: Waynetta Sandy, MD;  Location: [facility];  Service: Vascular;  Laterality: Left;  . Breast Ultrasound Left 04/16/13   Done @ breast center Impression: no malignancy appearance noted on the screen study is consistent with a summation shadow  . CARDIAC CATHETERIZATION    . CAROTID-SUBCLAVIAN BYPASS GRAFT Left 06/13/2018   Procedure: BYPASS GRAFT CAROTID-SUBCLAVIAN;  Surgeon: Waynetta Sandy, MD;  Location: [facility];  Service: Vascular;  Laterality: Left;  . COLONOSCOPY    . DG TUMB RIGHT HAND Right    cyst removal  . IR THORACENTESIS ASP PLEURAL SPACE W/IMG GUIDE  05/31/2018  . JOINT REPLACEMENT    . THORACIC AORTIC ENDOVASCULAR STENT GRAFT Left 06/13/2018   Procedure: LEFT  SUBCLAVIAN TO CAROTID ARTERY TRANSPOSITION; THORACIC AORTIC ENDOVASCULAR STENT GRAFT using GORE CONFORMABLE THORACIC STENT GRAFT AND GORE TAG THORACIC ENDOPROSTHESIS; STENT LEFT COMMON CAROTID ARTERY, RIGHT COMMON-FEMORAL ENDARTERECTOMY WITH PATCH ANGIOPLASTY;  Surgeon: Waynetta Sandy, MD;  Location: [facility];  Service: Vascular;  Laterality: Left;  . TONSILLECTOMY    . TOTAL HIP ARTHROPLASTY Left 01/03/2013   Procedure: TOTAL HIP ARTHROPLASTY ANTERIOR APPROACH;  Surgeon: Mcarthur Rossetti, MD;  Location: [facility];  Service: Orthopedics;  Laterality: Left;  Left Total Hip Arthroplasty, Anterior Approach  . TUBAL LIGATION    . VAGINA RECONSTRUCTION SURGERY  1966   vagina had closed up when 69 years old  . WOUND EXPLORATION Left 06/16/2018   Procedure: LEFT NECK WOUND EXPLORATION, CHEST TUBE INSERTION;  Surgeon: Waynetta Sandy, MD;  [facility];  Service: Vascular;  Laterality: Left;    Family History  Problem Relation Age of Onset  . Heart disease Father   . Diabetes Mother   . Heart disease Mother        pacemaker  . Heart attack Mother 37  . Diabetes Sister   . Alcohol abuse Other   .  Heart disease Other   . Hyperlipidemia Other   . Hypertension Other   . Diabetes Other   . Breast cancer Other   . Colon cancer Neg Hx   . Esophageal cancer Neg Hx   . Stomach cancer Neg Hx   . Rectal cancer Neg Hx    Social History:  reports that she has never smoked. She has never used smokeless tobacco. She reports previous alcohol use. She reports that she does not use drugs.  Allergies:  Allergies  Allergen Reactions  . Penicillins Swelling and Other (See Comments)     Severe swelling PATIENT HAS HAD A PCN REACTION WITH IMMEDIATE RASH, FACIAL/TONGUE/THROAT SWELLING, SOB, OR LIGHTHEADEDNESS WITH HYPOTENSION:  #  #  YES  #  #  Has patient had a PCN reaction causing severe rash involving mucus membranes or skin necrosis: No PATIENT HAS HAD A PCN REACTION THAT REQUIRED HOSPITALIZATION:  #  #  YES  #  #  Has patient had a PCN reaction occurring within the last 10 years: No  . Shellfish Allergy Anaphylaxis  . Latex Hives and Rash  . Banana Nausea Only  . Oxycodone     Pt stated, "Made me feel really drowsy"  . Asa [Aspirin] Nausea And Vomiting    Makes stomach upset Asprin 325  . Celebrex [Celecoxib] Nausea And Vomiting    GI upset  . Nsaids Rash    Medications Prior to Admission  Medication Sig Dispense Refill  . aspirin EC 81 MG tablet Take 81 mg by mouth daily.    . digoxin (LANOXIN) 0.25 MG tablet Take 0.5 tablets (125 mcg total) by mouth every morning. 90 tablet 1  . diltiazem (DILACOR XR) 240 MG 24 hr capsule TAKE 1 CAPSULE(240 MG) BY MOUTH EVERY MORNING 90 capsule 1  . ferrous sulfate 325 (65 FE) MG tablet Take 325 mg by mouth once a week.     . furosemide (LASIX) 20 MG tablet Take 1 tablet (20 mg total) by mouth daily. 90 tablet 1  . Multiple Vitamin (MULTIVITAMIN) tablet Take 2 tablets by mouth daily. Gummies    . ondansetron (ZOFRAN) 4 MG tablet Take 1 tablet (4 mg total) by mouth every 8 (eight) hours as needed for nausea or vomiting. 20 tablet 0  . oxyCODONE-acetaminophen (PERCOCET) 10-325 MG tablet Take 1 tablet by mouth every 6 (six) hours as needed for pain. 20 tablet 0  . rosuvastatin (CRESTOR) 20 MG tablet TAKE 1 TABLET(20 MG) BY MOUTH DAILY 90 tablet 1  . XARELTO 20 MG TABS tablet TAKE 1 TABLET(20 MG) BY MOUTH DAILY WITH SUPPER (Patient taking differently: Take 20 mg by mouth daily with supper. ) 90 tablet 0  . acetaminophen (TYLENOL) 500 MG tablet Take 1,000 mg by mouth as needed for moderate pain.       No results found for this  or any previous visit (from the past 48 hour(s)). No results found.  Pertinent items noted in HPI and remainder of comprehensive ROS otherwise negative.  Blood pressure (!) 161/80, pulse 74, temperature 98.4 F (36.9 C), temperature source Oral, resp. rate 18, height 5\' 4"  (1.626 m), weight 74.4 kg, SpO2 97 %.  Patient is awake and alert.  She is oriented and appropriate.  Speech is fluent.  Judgment insight are intact.  Cranial nerve function normal bilateral.  Motor examination reveals some mild weakness in her left deltoid and biceps and her left triceps and grip.  Sensor examination with decrease in station finger light  touch in her C5-C6 and C7 dermatomes left worse than right.  Reflexes are normal active.  No evidence of long track signs.  Gait is antalgic.  Posture is normal peer examination head ears eyes nose throat is unremarked.  Chest and abdomen are benign.  Extremities are free from injury deformity. Assessment/Plan C3-4, C4-5, C5-6, C6-7 spondylosis with stenosis and radiculopathy.  Plan C3-4, C4-5, C5-6, C6-7 anterior cervical discectomy and interbody fusion utilizing interbody cages, locally harvested autograft, and anterior plate instrumentation.  Risks and benefits been explained.  Patient wishes to proceed.  Mallie Mussel A Jamicheal Heard 02/02/2020, 7:57 AM

## 2020-02-02 NOTE — Anesthesia Procedure Notes (Signed)
Procedure Name: Intubation Date/Time: 02/02/2020 8:13 AM Performed by: Glynda Jaeger, CRNA Pre-anesthesia Checklist: Patient identified, Emergency Drugs available, Suction available and Patient being monitored Patient Re-evaluated:Patient Re-evaluated prior to induction Oxygen Delivery Method: Circle System Utilized Preoxygenation: Pre-oxygenation with 100% oxygen Induction Type: IV induction Ventilation: Mask ventilation without difficulty Laryngoscope Size: Glidescope Grade View: Grade I Tube type: Oral Tube size: 7.0 mm Number of attempts: 1 Airway Equipment and Method: Stylet Placement Confirmation: ETT inserted through vocal cords under direct vision,  positive ETCO2 and breath sounds checked- equal and bilateral Secured at: 21 cm Tube secured with: Tape Dental Injury: Teeth and Oropharynx as per pre-operative assessment  Comments: [place] performed

## 2020-02-03 ENCOUNTER — Encounter: Payer: Self-pay | Admitting: *Deleted

## 2020-02-03 DIAGNOSIS — M4722 Other spondylosis with radiculopathy, cervical region: Secondary | ICD-10-CM | POA: Diagnosis not present

## 2020-02-03 DIAGNOSIS — M4712 Other spondylosis with myelopathy, cervical region: Secondary | ICD-10-CM | POA: Diagnosis not present

## 2020-02-03 DIAGNOSIS — I110 Hypertensive heart disease with heart failure: Secondary | ICD-10-CM | POA: Diagnosis not present

## 2020-02-03 DIAGNOSIS — I509 Heart failure, unspecified: Secondary | ICD-10-CM | POA: Diagnosis not present

## 2020-02-03 DIAGNOSIS — M5010 Cervical disc disorder with radiculopathy, unspecified cervical region: Secondary | ICD-10-CM | POA: Diagnosis not present

## 2020-02-03 DIAGNOSIS — M4802 Spinal stenosis, cervical region: Secondary | ICD-10-CM | POA: Diagnosis not present

## 2020-02-03 MED ORDER — BACLOFEN 10 MG PO TABS: 5.0000 mg | ORAL_TABLET | Freq: Three times a day (TID) | ORAL | 0 refills | Status: DC | PRN

## 2020-02-03 MED ORDER — OXYCODONE-ACETAMINOPHEN 10-325 MG PO TABS: 1.0000 | ORAL_TABLET | Freq: Four times a day (QID) | ORAL | 0 refills | Status: DC | PRN

## 2020-02-03 NOTE — Discharge Instructions (Signed)
Wound Care Keep incision covered and dry for two days.  If you shower, cover incision with plastic wrap.  Do not put any creams, lotions, or ointments on incision. Leave steri-strips on back.  They will fall off by themselves. Activity Walk each and every day, increasing distance each day. No lifting greater than 5 lbs.  Avoid excessive neck motion. No driving for 2 weeks; may ride as a passenger locally. If provided with back brace, wear when out of bed.  It is not necessary to wear brace in bed. Diet Resume your normal diet.  Return to Work Will be discussed at you follow up appointment. Call Your Doctor If Any of These Occur Redness, drainage, or swelling at the wound.  Temperature greater than 101 degrees. Severe pain not relieved by pain medication. Incision starts to come apart. Follow Up Appt Call today for appointment in 1-2 weeks [id number]) or for problems.  If you have any hardware placed in your spine, you will need an x-ray before your appointment.    RESUME XARELTO on Saturday AM

## 2020-02-03 NOTE — Discharge Summary (Signed)
Physician Discharge Summary  Patient ID: Samantha Stevenson MRN: [id number] DOB/AGE: 12/25/1950 69 y.o.  Admit date: 02/02/2020 Discharge date: 02/03/2020  Admission Diagnoses:  Discharge Diagnoses:  Active Problems:   Cervical spondylosis with myelopathy and radiculopathy   Discharged Condition: good  Hospital Course: Patient admitted to the hospital where she underwent uncomplicated 4 level anterior cervical decompression and fusion for treatment of her compressive cervical myelopathy or postoperatively she is doing well.  Preoperative neck and upper extremity symptoms are much improved.  She swallowing well.  She is ambulating without difficulty.  She is voiding well.  She is ready for discharge home.  Consults:   Significant Diagnostic Studies:   Treatments:   Discharge Exam: Blood pressure 124/65, pulse 73, temperature 98.4 F (36.9 C), temperature source Oral, resp. rate 16, height 5\' 4"  (1.626 m), weight 74.4 kg, SpO2 95 %. Awake and alert.  Oriented and appropriate.  Motor and sensory function intact.  Wound clean and dry.  Chest and abdomen benign.  Disposition: Discharge disposition: 01-Home or Self Care        Allergies as of 02/03/2020      Reactions   Penicillins Swelling, Other (See Comments)   Severe swelling PATIENT HAS HAD A PCN REACTION WITH IMMEDIATE RASH, FACIAL/TONGUE/THROAT SWELLING, SOB, OR LIGHTHEADEDNESS WITH HYPOTENSION:  #  #  YES  #  #  Has patient had a PCN reaction causing severe rash involving mucus membranes or skin necrosis: No PATIENT HAS HAD A PCN REACTION THAT REQUIRED HOSPITALIZATION:  #  #  YES  #  #  Has patient had a PCN reaction occurring within the last 10 years: No   Shellfish Allergy Anaphylaxis   Latex Hives, Rash   Banana Nausea Only   Oxycodone    Pt stated, "Made me feel really drowsy"   Asa [aspirin] Nausea And Vomiting   Makes stomach upset Asprin 325   Celebrex [celecoxib] Nausea And Vomiting   GI upset   Nsaids  Rash      Medication List    TAKE these medications   acetaminophen 500 MG tablet Commonly known as: TYLENOL Take 1,000 mg by mouth as needed for moderate pain.   aspirin EC 81 MG tablet Take 81 mg by mouth daily.   baclofen 10 MG tablet Commonly known as: LIORESAL Take 0.5 tablets (5 mg total) by mouth 3 (three) times daily as needed.   digoxin 0.25 MG tablet Commonly known as: LANOXIN Take 0.5 tablets (125 mcg total) by mouth every morning.   diltiazem 240 MG 24 hr capsule Commonly known as: DILACOR XR TAKE 1 CAPSULE(240 MG) BY MOUTH EVERY MORNING   ferrous sulfate 325 (65 FE) MG tablet Take 325 mg by mouth once a week.   furosemide 20 MG tablet Commonly known as: LASIX Take 1 tablet (20 mg total) by mouth daily.   multivitamin tablet Take 2 tablets by mouth daily. Gummies   ondansetron 4 MG tablet Commonly known as: Zofran Take 1 tablet (4 mg total) by mouth every 8 (eight) hours as needed for nausea or vomiting.   oxyCODONE-acetaminophen 10-325 MG tablet Commonly known as: Percocet Take 1 tablet by mouth every 6 (six) hours as needed for pain.   rosuvastatin 20 MG tablet Commonly known as: CRESTOR TAKE 1 TABLET(20 MG) BY MOUTH DAILY   Xarelto 20 MG Tabs tablet Generic drug: rivaroxaban TAKE 1 TABLET(20 MG) BY MOUTH DAILY WITH SUPPER What changed: See the new instructions.        Signed:  Mallie Mussel A Marvia Troost 02/03/2020, 10:33 AM

## 2020-02-03 NOTE — Evaluation (Addendum)
Occupational Therapy Evaluation Patient Details Name: Samantha Stevenson MRN: [id number] DOB: 06/28/1951 Today's Date: 02/03/2020    History of Present Illness Pt is a 69 yo female s/p  C3-4, C4-5, C5-6, C6-7 ACDF. PMHx: hip sx, arthritis, CHF, HTN, HLD.   Clinical Impression   Pt PTA: Pt reports independence with ADL and mobility in community with SPC. Pt currently, pt performing LB dressing with figure 4 technique and no physical assist for LB/UB ADL. Pt ambulating in room and hallways with supervision. Pt performing stair training x10 steps up/down with no assist required. Episode of N/V (small amount) and pt recovered quickly.  Back handout provided and reviewed  ADL n detail. Pt educated on: set an alarm at night for medication, avoid sitting for long periods of time, correct bed positioning for sleeping, correct sequence for bed mobility, avoiding lifting more than 5 pounds and never wash directly over incision. All education is complete and patient indicates understanding. Pt does not require continued OT skilled services. OT signing off.    Follow Up Recommendations  No OT follow up;Supervision - Intermittent    Equipment Recommendations  None recommended by OT    Recommendations for Other Services       Precautions / Restrictions Precautions Precautions: Cervical Precaution Booklet Issued: Yes (comment) Precaution Comments: verbal discussion; handout provided and pt recalled all precautions Required Braces or Orthoses: Cervical Brace Cervical Brace: Soft collar Restrictions Weight Bearing Restrictions: No      Mobility Bed Mobility Overal bed mobility: Needs Assistance Bed Mobility: Rolling;Sidelying to Sit;Sit to Supine Rolling: Supervision Sidelying to sit: Supervision   Sit to supine: Supervision   General bed mobility comments: SupervisionA for log roll technique  Transfers Overall transfer level: Needs assistance Equipment used: Rolling walker (2  wheeled) Transfers: Sit to/from Stand Sit to Stand: Supervision         General transfer comment: No physical assist    Balance Overall balance assessment: No apparent balance deficits (not formally assessed)                                         ADL either performed or assessed with clinical judgement   ADL Overall ADL's : Modified independent;At baseline                                       General ADL Comments: Pt performing LB dressing with figure 4 technique for LB ADL. Pt wanting to wash up first before changing into her clothes. Simulation of UB dressing performed. Pt familar with soft collar and pt reports practicing donning/doffing at home with family. Pt mobilizing well in room with no AD; a flight of stairs up/down performed with only supervisionA. Pt had an episode of N/V and had to sit down (small amount). Pt quikcly recovered and ginger ale provided. Pt performing walk in shower transfer with supervisionA.      Vision Baseline Vision/History: Wears glasses Wears Glasses: At all times Patient Visual Report: No change from baseline Vision Assessment?: No apparent visual deficits     Perception     Praxis      Pertinent Vitals/Pain Pain Assessment: Faces Faces Pain Scale: Hurts little more Pain Location: neck, shoulders, back Pain Descriptors / Indicators: Discomfort;Sore Pain Intervention(s): Monitored during session;Repositioned;Ice applied     Hand Dominance  Right   Extremity/Trunk Assessment Upper Extremity Assessment Upper Extremity Assessment: Overall WFL for tasks assessed   Lower Extremity Assessment Lower Extremity Assessment: Generalized weakness   Cervical / Trunk Assessment Cervical / Trunk Assessment: Other exceptions Cervical / Trunk Exceptions: s/p cervical sx   Communication Communication Communication: No difficulties   Cognition Arousal/Alertness: Awake/alert Behavior During Therapy: WFL for  tasks assessed/performed Overall Cognitive Status: Within Functional Limits for tasks assessed                                     General Comments       Exercises     Shoulder Instructions      Home Living Family/patient expects to be discharged to:: Private residence Living Arrangements: Alone Available Help at Discharge: Family;Available PRN/intermittently Type of Home: House Home Access: Stairs to enter [employer] of Steps: 5 Entrance Stairs-Rails: Right Home Layout: One level     Bathroom Shower/Tub: Occupational psychologist: Handicapped height     Home Equipment: Environmental consultant - 2 wheels;Cane - single point          Prior Functioning/Environment Level of Independence: Independent with assistive device(s)        Comments: uses SPC for mobility; independent        OT Problem List: Decreased activity tolerance      OT Treatment/Interventions:      OT Goals(Current goals can be found in the care plan section) Acute Rehab OT Goals Patient Stated Goal: to go home OT Goal Formulation: With patient  OT Frequency:     Barriers to D/C:            Co-evaluation              AM-PAC OT "6 Clicks" Daily Activity     Outcome Measure Help from another person eating meals?: None Help from another person taking care of personal grooming?: None Help from another person toileting, which includes using toliet, bedpan, or urinal?: None Help from another person bathing (including washing, rinsing, drying)?: A Little Help from another person to put on and taking off regular upper body clothing?: None Help from another person to put on and taking off regular lower body clothing?: None 6 Click Score: 23   End of Session Equipment Utilized During Treatment: Cervical collar Nurse Communication: Mobility status  Activity Tolerance: Patient tolerated treatment well Patient left: in bed;with call bell/phone within reach  OT Visit  Diagnosis: Muscle weakness (generalized) (M62.81);Pain Pain - part of body: (neck)                Time: [id number] OT Time Calculation (min): 38 min Charges:  OT General Charges $OT Visit: 1 Visit OT Evaluation $OT Eval Moderate Complexity: 1 Mod OT Treatments $Self Care/Home Management : 8-22 mins $Therapeutic Activity: 8-22 mins  Jefferey Pica, OTR/L Acute Rehabilitation Services Pager: [phone] Office: [phone]  Oriel Rumbold C 02/03/2020, 9:08 AM

## 2020-02-03 NOTE — Progress Notes (Signed)
Discharge instructions reviewed with pt.   Copy of instructions given to pt, new scripts were sent to pt's pharmacy per MD/surgeon, pt informed.   At 56 family here.    Pt d/c'd via wheelchair with belongings, with family picking up at main entrance.           Escorted by hospital volunteer.

## 2020-02-16 ENCOUNTER — Other Ambulatory Visit: Payer: Self-pay

## 2020-02-16 ENCOUNTER — Ambulatory Visit: Payer: Medicare Other | Admitting: Pharmacist

## 2020-02-16 DIAGNOSIS — I10 Essential (primary) hypertension: Secondary | ICD-10-CM

## 2020-02-16 DIAGNOSIS — I4891 Unspecified atrial fibrillation: Secondary | ICD-10-CM

## 2020-02-16 DIAGNOSIS — E785 Hyperlipidemia, unspecified: Secondary | ICD-10-CM

## 2020-02-16 NOTE — Chronic Care Management (AMB) (Signed)
Chronic Care Management Pharmacy  Name: Samantha Stevenson  MRN: [id number] DOB: January 16, 1951  Chief Complaint/ HPI  Samantha Stevenson,  69 y.o. , female presents for their Initial CCM visit with the clinical pharmacist via telephone due to COVID-19 Pandemic.  PCP : Samantha Koch, MD  Their chronic conditions include: Afib, Hx MI (2000), HTN, HLD, carotid disease w/ stent,  arthritis, cervical spondylosis, chronic back/neck pain  Office Visits: 07/17/19 Dr Samantha Stevenson OV: c/o constipation, neck pain, Xarelto duration. Discussed need for Xarelto due to [facility] of 5, however can consult with cardiology. Rx'd magnesium citrtate and Miralax for constipation.  Consult Visit: 02/02/20 Admission for spine surgery: uncomplicated procedure, pt recovering well.  11/18/19 PA Samantha Stevenson (cardiology): [place] 5, h/o provoked DVT (post hip surgery), low burden d/t lack of sx. Cleared for surgery C-spine fusion. Hold Xarelto 3 days pre-op.   Seeing podiatry for bunion, s/p osteotomy, healing well.   09/30/19 Dr Samantha Stevenson (urology) via claims.  08/26/19 Dr Samantha Stevenson (GI): constipation, dysphagia,   Medications: Outpatient Encounter Medications as of 02/16/2020  Medication Sig  . acetaminophen (TYLENOL) 500 MG tablet Take 1,000 mg by mouth as needed for moderate pain.   Marland Kitchen aspirin EC 81 MG tablet Take 81 mg by mouth daily.  . digoxin (LANOXIN) 0.25 MG tablet Take 0.5 tablets (125 mcg total) by mouth every morning.  . diltiazem (DILACOR XR) 240 MG 24 hr capsule TAKE 1 CAPSULE(240 MG) BY MOUTH EVERY MORNING  . ferrous sulfate 325 (65 FE) MG tablet Take 325 mg by mouth once a week.   . furosemide (LASIX) 20 MG tablet Take 1 tablet (20 mg total) by mouth daily.  . Multiple Vitamin (MULTIVITAMIN) tablet Take 2 tablets by mouth daily. Gummies  . oxyCODONE-acetaminophen (PERCOCET) 10-325 MG tablet Take 1 tablet by mouth every 6 (six) hours as needed for pain.  . rosuvastatin (CRESTOR) 20 MG tablet  TAKE 1 TABLET(20 MG) BY MOUTH DAILY  . XARELTO 20 MG TABS tablet TAKE 1 TABLET(20 MG) BY MOUTH DAILY WITH SUPPER (Patient taking differently: Take 20 mg by mouth daily with supper. )  . baclofen (LIORESAL) 10 MG tablet Take 0.5 tablets (5 mg total) by mouth 3 (three) times daily as needed. (Patient not taking: Reported on 02/16/2020)  . ondansetron (ZOFRAN) 4 MG tablet Take 1 tablet (4 mg total) by mouth every 8 (eight) hours as needed for nausea or vomiting. (Patient not taking: Reported on 02/16/2020)   No facility-administered encounter medications on file as of 02/16/2020.     Current Diagnosis/Assessment:  SDOH Interventions     Most Recent Value  SDOH Interventions  SDOH Interventions for the Following Domains  Transportation  Transportation Interventions  Other (Comment) [offered medication delivery through [facility], patient prefers to ask daughter to pick up meds for her]      Goals Addressed            This Visit's Progress   . Atrial fibrillation: improve medication safety       CARE PLAN ENTRY  Current Barriers:  . Chronic Disease Management support, education, and care coordination needs related to Atrial Fibrillation . Current regimen: Xarelto 20 mg daily, diltiazem 240 mg daily, digoxin 0.125 mg daily . Digoxin level 10/20/19: 0.5 ng/mL (goal range 0.5 - 1.0)  Pharmacist Clinical Goal(s):  Marland Kitchen Over the next 120 days, patient will work with PharmD and providers towards optimized anticoagulation therapy  Interventions: . Comprehensive medication review performed. . Discussed signs and symptoms of bleeding . Discussed  benefits and risks of digoxin o Consider discussing possibly stopping digoxin with cardiologist given low disease burden with Afib and also taking diltiazem  Patient Self Care Activities:  . Self administers medications as prescribed and Calls provider office for new concerns or questions . Patient will monitor for signs and symptoms of bleeding and  digoxin toxicity . Patient will discuss continued digoxin therapy with cardiologist  Initial goal documentation    . Cholesterol: goal LDL < 100       CARE PLAN ENTRY (see longitudinal plan of care for additional care plan information)  Current Barriers:  . Uncontrolled hyperlipidemia, complicated by CAD, Afib,  . Current antihyperlipidemic regimen: rosuvastatin 20 mg daily . Previous antihyperlipidemic medications tried n/a . Most recent lipid panel:     Component Value Date/Time   CHOL 189 01/03/2018 1044   TRIG 128.0 01/03/2018 1044   HDL 46.70 01/03/2018 1044   CHOLHDL 4 01/03/2018 1044   VLDL 25.6 01/03/2018 1044   LDLCALC 117 (H) 01/03/2018 1044   . ASCVD risk enhancing conditions: age >75, HTN, history of MI  Pharmacist Clinical Goal(s):  Marland Kitchen Over the next 120 days, patient will work with PharmD and providers towards optimized antihyperlipidemic therapy  Interventions: . Discussed benefits of rosuvastatin for cholesterol lowering and prevention of heart attack and stroke . Discussed importance of heart-healthy diet including benefits of fish    Patient Self Care Activities:  . Patient will focus on medication adherence by pill box . Patient will focus on dietary modifications  Initial goal documentation     . Hypertension: goal BP < 130/80       CARE PLAN ENTRY (see longitudinal plan of care for additional care plan information)  Current Barriers:  . Uncontrolled hypertension, complicated by Afib, HLD, CAD . Current antihypertensive regimen: diltiazem 240 mg qAM, furosemide 20 mg daily . Last practice recorded BP readings:  BP Readings from Last 3 Encounters:  02/03/20 124/65  01/29/20 (!) 154/84  12/08/19 139/79 .  Current home BP readings: n/a  Pharmacist Clinical Goal(s):  Marland Kitchen Over the next 120 days, patient will work with PharmD and providers to optimize antihypertensive regimen to maintain BP < 130/80  Interventions: . Inter-disciplinary care team  collaboration (see longitudinal plan of care) . Comprehensive medication review performed; medication list updated in the electronic medical record.  . Discussed benefits of BP monitoring at home  Patient Self Care Activities:  . Patient will continue to check BP weekly , document, and provide at future appointments . Patient will focus on medication adherence by pill box  Initial goal documentation        AFIB/Hypertension   Patient is currently rate controlled. HR 70-80s BPM  Office blood pressures are  BP Readings from Last 3 Encounters:  02/03/20 124/65  01/29/20 (!) 154/84  12/08/19 139/79   Kidney Function Lab Results  Component Value Date   CREATININE 0.76 01/29/2020   CREATININE 0.78 10/30/2019   CREATININE 0.76 05/16/2019      Component Value Date/Time   GFR 76.05 05/16/2019 1448   GFRNONAA >60 01/29/2020 1134   GFRAA >60 01/29/2020 1134    Patient checks BP at home infrequently  Patient home BP readings are ranging: "normal"  Patient has failed these meds in past: n/a Patient is currently controlled on the following medications: digoxin 0.125 mg (1/2 of 0.25 mg) QAM, diltiazem 240 mg qAM, Xarelto 20 mg daily, furosemide 20 mg daily  We discussed:  Purpose of medications, role of digoxin  in Afib. Pt has been taking for many years, cardiology reports low burden from Afib, patient may be able to stop digoxin. Recommended to consult with cardiology at her f/u appt.  Discussed benefits of Xarelto, again patient can consult with cardiology regarding length of therapy however given CHADSVASC of 5 likely will require lifelong anticoagulation as long as benefits outweigh risks.  Plan  Continue current medications  Recommended to discuss possible d/c digoxin with cardiology  Hyperlipidemia/CAD   Lipid Panel     Component Value Date/Time   CHOL 189 01/03/2018 1044   TRIG 128.0 01/03/2018 1044   HDL 46.70 01/03/2018 1044   CHOLHDL 4 01/03/2018 1044   VLDL  25.6 01/03/2018 1044   LDLCALC 117 (H) 01/03/2018 1044    The ASCVD Risk score (Goff DC Jr., et al., 2013) failed to calculate for the following reasons:   The patient has a prior MI or stroke diagnosis  Hx MI (2000), carotid artery disease w/ stent  Patient has failed these meds in past: n/a Patient is currently controlled on the following medications: rosuvastatin 20 mg daily, aspirin 81 mg daily  We discussed:  diet and exercise extensively; pt reports diet with plenty of fish, chicken, [place]. Denies fried foods or red meat. Discussed cholesterol goal LDL < 100, it has been over 2 years since last lipid panel, recommend to recheck panel at next appt to guide therapy decisions.  Plan  Continue current medications and control with diet and exercise  Recommend repeat lipid panel at next PCP appt   Anemia    CBC Latest Ref Rng & Units 01/29/2020 05/16/2019 10/09/2018  WBC 4.0 - 10.5 K/uL 5.8 6.5 7.3  Hemoglobin 12.0 - 15.0 g/dL 11.3(L) 10.4(L) 10.1(L)  Hematocrit 36.0 - 46.0 % 38.2 32.6(L) 33.8(L)  Platelets 150 - 400 K/uL 297 314.0 325    Patient has failed these meds in past: n/a Patient is currently controlled on the following medications: ferrous sulfate 325 mg once weekly  We discussed:  Administration of iron with food to prevent GI side effects. Pt asked about gummies to improve palatability.   Plan  Continue current medications - may try gummies   Chronic pain   Patient has failed these meds in past: n/a Patient is currently controlled on the following medications: baclofen 5 mg (1/2 of 10 mg) TID prn, oxycodone-APAP 10-325 mg q6h prn, Tylenol 500 mg prn  We discussed:  Had spinal surgery few weeks ago, recovering slowly. Still not driving.  Plan  Continue current medications   Health Maintenance   Patient is currently controlled on the following medications: multivitamin,  We discussed:  Patient is satisfied with current OTC regimen and denies  issues  Plan  Continue current medications    Medication Management   Pt uses [facility] for all medications Uses pill box? Yes Pt endorses 100% compliance  We discussed: Benefits of med synchronization, packaging and delivery. Pt is worried she will not be home to receive deliveries, went over delivery process in detail. Patient would like to switch to [facility] pharmacy because it is preferred with her insurance and is closer to her daughter who picks up her rx's when pt can't drive.  Plan  Continue current medication management strategy    Follow up: 4 month phone visit  Charlene Brooke, PharmD Clinical Pharmacist [facility] [phone]

## 2020-02-16 NOTE — Patient Instructions (Addendum)
Visit Information  Thank you for meeting with me to discuss your medications! I look forward to working with you to achieve your health care goals. Below is a summary of what we talked about during the visit:  Goals Addressed            This Visit's Progress   . Atrial fibrillation: improve medication safety       CARE PLAN ENTRY  Current Barriers:  . Chronic Disease Management support, education, and care coordination needs related to Atrial Fibrillation . Current regimen: Xarelto 20 mg daily, diltiazem 240 mg daily, digoxin 0.125 mg daily . Digoxin level 10/20/19: 0.5 ng/mL (goal range 0.5 - 1.0)  Pharmacist Clinical Goal(s):  Marland Kitchen Over the next 120 days, patient will work with PharmD and providers towards optimized anticoagulation therapy  Interventions: . Comprehensive medication review performed. . Discussed signs and symptoms of bleeding . Discussed benefits and risks of digoxin o Consider discussing possibly stopping digoxin with cardiologist given low disease burden with Afib and also taking diltiazem  Patient Self Care Activities:  . Self administers medications as prescribed and Calls provider office for new concerns or questions . Patient will monitor for signs and symptoms of bleeding and digoxin toxicity . Patient will discuss continued digoxin therapy with cardiologist  Initial goal documentation    . Cholesterol: goal LDL < 100       CARE PLAN ENTRY (see longitudinal plan of care for additional care plan information)  Current Barriers:  . Uncontrolled hyperlipidemia, complicated by CAD, Afib,  . Current antihyperlipidemic regimen: rosuvastatin 20 mg daily . Previous antihyperlipidemic medications tried n/a . Most recent lipid panel:     Component Value Date/Time   CHOL 189 01/03/2018 1044   TRIG 128.0 01/03/2018 1044   HDL 46.70 01/03/2018 1044   CHOLHDL 4 01/03/2018 1044   VLDL 25.6 01/03/2018 1044   LDLCALC 117 (H) 01/03/2018 1044   . ASCVD risk  enhancing conditions: age >35, HTN, history of MI  Pharmacist Clinical Goal(s):  Marland Kitchen Over the next 120 days, patient will work with PharmD and providers towards optimized antihyperlipidemic therapy  Interventions: . Discussed benefits of rosuvastatin for cholesterol lowering and prevention of heart attack and stroke . Discussed importance of heart-healthy diet including benefits of fish    Patient Self Care Activities:  . Patient will focus on medication adherence by pill box . Patient will focus on dietary modifications  Initial goal documentation     . Hypertension: goal BP < 130/80       CARE PLAN ENTRY (see longitudinal plan of care for additional care plan information)  Current Barriers:  . Uncontrolled hypertension, complicated by Afib, HLD, CAD . Current antihypertensive regimen: diltiazem 240 mg qAM, furosemide 20 mg daily . Last practice recorded BP readings:  BP Readings from Last 3 Encounters:  02/03/20 124/65  01/29/20 (!) 154/84  12/08/19 139/79 .  Current home BP readings: n/a  Pharmacist Clinical Goal(s):  Marland Kitchen Over the next 120 days, patient will work with PharmD and providers to optimize antihypertensive regimen to maintain BP < 130/80  Interventions: . Inter-disciplinary care team collaboration (see longitudinal plan of care) . Comprehensive medication review performed; medication list updated in the electronic medical record.  . Discussed benefits of BP monitoring at home  Patient Self Care Activities:  . Patient will continue to check BP weekly , document, and provide at future appointments . Patient will focus on medication adherence by pill box  Initial goal documentation  Samantha Stevenson was given information about Chronic Care Management services today including:  1. CCM service includes personalized support from designated clinical staff supervised by her physician, including individualized plan of care and coordination with other care  providers 2. 24/7 contact phone numbers for assistance for urgent and routine care needs. 3. Standard insurance, coinsurance, copays and deductibles apply for chronic care management only during months in which we provide at least 20 minutes of these services. Most insurances cover these services at 100%, however patients may be responsible for any copay, coinsurance and/or deductible if applicable. This service may help you avoid the need for more expensive face-to-face services. 4. Only one practitioner may furnish and bill the service in a calendar month. 5. The patient may stop CCM services at any time (effective at the end of the month) by phone call to the office staff.  Patient agreed to services and verbal consent obtained.   The patient verbalized understanding of instructions provided today and agreed to receive a mailed copy of patient instruction and/or educational materials. Telephone follow up appointment with pharmacy team member scheduled for: 4 months  Charlene Brooke, PharmD Clinical Pharmacist [facility] [phone]    Cholesterol Content in Foods Cholesterol is a waxy, fat-like substance that helps to carry fat in the blood. The body needs cholesterol in small amounts, but too much cholesterol can cause damage to the arteries and heart. Most people should eat less than 200 milligrams (mg) of cholesterol a day. Foods with cholesterol  Cholesterol is found in animal-based foods, such as meat, seafood, and dairy. Generally, low-fat dairy and lean meats have less cholesterol than full-fat dairy and fatty meats. The milligrams of cholesterol per serving (mg per serving) of common cholesterol-containing foods are listed below. Meat and other proteins  Egg -- one large whole egg has 186 mg.  Veal shank -- 4 oz has 141 mg.  Lean ground [place] (93% lean) -- 4 oz has 118 mg.  Fat-trimmed lamb loin -- 4 oz has 106 mg.  Lean ground beef (90%  lean) -- 4 oz has 100 mg.  Lobster -- 3.5 oz has 90 mg.  Pork loin chops -- 4 oz has 86 mg.  Canned salmon -- 3.5 oz has 83 mg.  Fat-trimmed beef top loin -- 4 oz has 78 mg.  Frankfurter -- 1 frank (3.5 oz) has 77 mg.  Crab -- 3.5 oz has 71 mg.  Roasted chicken without skin, white meat -- 4 oz has 66 mg.  Light bologna -- 2 oz has 45 mg.  Deli-cut [place] -- 2 oz has 31 mg.  Canned tuna -- 3.5 oz has 31 mg.  Berniece Salines -- 1 oz has 29 mg.  Oysters and mussels (raw) -- 3.5 oz has 25 mg.  Mackerel -- 1 oz has 22 mg.  Trout -- 1 oz has 20 mg.  Pork sausage -- 1 link (1 oz) has 17 mg.  Salmon -- 1 oz has 16 mg.  Tilapia -- 1 oz has 14 mg. Dairy  Soft-serve ice cream --  cup (4 oz) has 103 mg.  Whole-milk yogurt -- 1 cup (8 oz) has 29 mg.  Cheddar cheese -- 1 oz has 28 mg.  American cheese -- 1 oz has 28 mg.  Whole milk -- 1 cup (8 oz) has 23 mg.  2% milk -- 1 cup (8 oz) has 18 mg.  Cream cheese -- 1 tablespoon (Tbsp) has 15 mg.  Cottage cheese --  cup (4 oz) has 14 mg.  Low-fat (1%) milk -- 1 cup (8 oz) has 10 mg.  Sour cream -- 1 Tbsp has 8.5 mg.  Low-fat yogurt -- 1 cup (8 oz) has 8 mg.  Nonfat Greek yogurt -- 1 cup (8 oz) has 7 mg.  Half-and-half cream -- 1 Tbsp has 5 mg. Fats and oils  Cod liver oil -- 1 tablespoon (Tbsp) has 82 mg.  Butter -- 1 Tbsp has 15 mg.  Lard -- 1 Tbsp has 14 mg.  Bacon grease -- 1 Tbsp has 14 mg.  Mayonnaise -- 1 Tbsp has 5-10 mg.  Margarine -- 1 Tbsp has 3-10 mg. Exact amounts of cholesterol in these foods may vary depending on specific ingredients and brands. Foods without cholesterol Most plant-based foods do not have cholesterol unless you combine them with a food that has cholesterol. Foods without cholesterol include:  Grains and cereals.  Vegetables.  Fruits.  Vegetable oils, such as olive, canola, and sunflower oil.  Legumes, such as peas, beans, and lentils.  Nuts and seeds.  Egg  whites. Summary  The body needs cholesterol in small amounts, but too much cholesterol can cause damage to the arteries and heart.  Most people should eat less than 200 milligrams (mg) of cholesterol a day. This information is not intended to replace advice given to you by your health care provider. Make sure you discuss any questions you have with your health care provider. Document Revised: 09/21/2017 Document Reviewed: 06/05/2017 Elsevier Patient Education  [place].

## 2020-02-18 NOTE — Addendum Note (Signed)
Addended by: Aviva Signs M on: 02/18/2020 01:41 PM   Modules accepted: Orders

## 2020-02-24 DIAGNOSIS — L821 Other seborrheic keratosis: Secondary | ICD-10-CM | POA: Diagnosis not present

## 2020-02-24 DIAGNOSIS — D225 Melanocytic nevi of trunk: Secondary | ICD-10-CM | POA: Diagnosis not present

## 2020-02-24 DIAGNOSIS — L218 Other seborrheic dermatitis: Secondary | ICD-10-CM | POA: Diagnosis not present

## 2020-03-03 DIAGNOSIS — M4802 Spinal stenosis, cervical region: Secondary | ICD-10-CM | POA: Diagnosis not present

## 2020-03-23 DIAGNOSIS — I5032 Chronic diastolic (congestive) heart failure: Secondary | ICD-10-CM | POA: Diagnosis not present

## 2020-03-23 DIAGNOSIS — I10 Essential (primary) hypertension: Secondary | ICD-10-CM | POA: Diagnosis not present

## 2020-03-23 DIAGNOSIS — R809 Proteinuria, unspecified: Secondary | ICD-10-CM | POA: Diagnosis not present

## 2020-03-23 DIAGNOSIS — R82998 Other abnormal findings in urine: Secondary | ICD-10-CM | POA: Diagnosis not present

## 2020-03-23 DIAGNOSIS — R3129 Other microscopic hematuria: Secondary | ICD-10-CM | POA: Diagnosis not present

## 2020-04-05 DIAGNOSIS — N362 Urethral caruncle: Secondary | ICD-10-CM | POA: Diagnosis not present

## 2020-04-05 DIAGNOSIS — R3121 Asymptomatic microscopic hematuria: Secondary | ICD-10-CM | POA: Diagnosis not present

## 2020-04-08 DIAGNOSIS — M4802 Spinal stenosis, cervical region: Secondary | ICD-10-CM | POA: Diagnosis not present

## 2020-04-09 ENCOUNTER — Ambulatory Visit: Payer: Medicare Other

## 2020-04-12 ENCOUNTER — Other Ambulatory Visit: Payer: Self-pay

## 2020-04-12 ENCOUNTER — Ambulatory Visit (INDEPENDENT_AMBULATORY_CARE_PROVIDER_SITE_OTHER): Payer: Medicare Other

## 2020-04-12 VITALS — BP 120/70 | HR 68 | Temp 98.3°F | Resp 16 | Ht 64.0 in | Wt 165.0 lb

## 2020-04-12 DIAGNOSIS — Z0000 Encounter for general adult medical examination without abnormal findings: Secondary | ICD-10-CM

## 2020-04-12 NOTE — Progress Notes (Signed)
Subjective:   Samantha Stevenson is a 69 y.o. female who presents for Medicare Annual (Subsequent) preventive examination.  Review of Systems    No Ros. Medicare Wellness Visit Cardiac Risk Factors include: advanced age (>43men, >54 women);dyslipidemia;family history of premature cardiovascular disease;hypertension     Objective:    Today's Vitals   04/12/20 0958  BP: 120/70  Pulse: 68  Resp: 16  Temp: 98.3 F (36.8 C)  SpO2: 98%  Weight: 165 lb (74.8 kg)  Height: 5\' 4"  (1.626 m)  PainSc: 0-No pain   Body mass index is 28.32 kg/m.  Advanced Directives 04/12/2020 02/02/2020 01/14/2020 04/08/2019 11/15/2018 10/09/2018 08/13/2018  Does Patient Have a Medical Advance Directive? No No No No No No No  Would patient like information on creating a medical advance directive? No - Patient declined No - Patient declined No - Patient declined No - Patient declined No - Patient declined No - Patient declined No - Patient declined  Pre-existing out of facility DNR order (yellow form or pink MOST form) - - - - - - -    Current Medications (verified) Outpatient Encounter Medications as of 04/12/2020  Medication Sig  . acetaminophen (TYLENOL) 500 MG tablet Take 1,000 mg by mouth as needed for moderate pain.   Marland Kitchen aspirin EC 81 MG tablet Take 81 mg by mouth daily.  . baclofen (LIORESAL) 10 MG tablet Take 0.5 tablets (5 mg total) by mouth 3 (three) times daily as needed. (Patient not taking: Reported on 02/16/2020)  . digoxin (LANOXIN) 0.25 MG tablet Take 0.5 tablets (125 mcg total) by mouth every morning.  . diltiazem (DILACOR XR) 240 MG 24 hr capsule TAKE 1 CAPSULE(240 MG) BY MOUTH EVERY MORNING  . ferrous sulfate 325 (65 FE) MG tablet Take 325 mg by mouth once a week.   . furosemide (LASIX) 20 MG tablet Take 1 tablet (20 mg total) by mouth daily.  . Multiple Vitamin (MULTIVITAMIN) tablet Take 2 tablets by mouth daily. Gummies  . ondansetron (ZOFRAN) 4 MG tablet Take 1 tablet (4 mg total) by  mouth every 8 (eight) hours as needed for nausea or vomiting. (Patient not taking: Reported on 02/16/2020)  . oxyCODONE-acetaminophen (PERCOCET) 10-325 MG tablet Take 1 tablet by mouth every 6 (six) hours as needed for pain.  . rosuvastatin (CRESTOR) 20 MG tablet TAKE 1 TABLET(20 MG) BY MOUTH DAILY  . XARELTO 20 MG TABS tablet TAKE 1 TABLET(20 MG) BY MOUTH DAILY WITH SUPPER (Patient taking differently: Take 20 mg by mouth daily with supper. )   No facility-administered encounter medications on file as of 04/12/2020.    Allergies (verified) Penicillins, Shellfish allergy, Latex, Banana, Oxycodone, Asa [aspirin], Celebrex [celecoxib], and Nsaids   History: Past Medical History:  Diagnosis Date  . Allergy   . Anemia   . Arthritis    "qwhere" (07/29/2018)  . Atrial fibrillation ([place])   . CHF (congestive heart failure) ([place])   . Clotting disorder ([place])   . Degenerative arthritis of hip    s/p L THR 12/2012  . Dyspnea    since surgery in August 2019- "when I get worked up and walk a short distance"  . Dysrhythmia   . History of blood transfusion 05/2018   "related to OR"  . Hyperlipidemia   . Hypertension   . Intramural hematoma of thoracic aorta ([place]) 06/13/2018  . Migraines    mIgraines- none since blood pressure and lipids are under control  . Myocardial infarction ([place]) 2000   due  to atrial fib  . Neuromuscular disorder ([place])    pinched nerve- left side of neck  . Pulmonary emboli ([place]) 12/2012 dx   post op (L THR), anticoag x 92mo   Past Surgical History:  Procedure Laterality Date  . ABDOMINAL HYSTERECTOMY     "w/1 tube"  . ANTERIOR CERVICAL DECOMPRESSION/DISCECTOMY FUSION 4 LEVELS N/A 02/02/2020   Procedure: CERVICAL THREE-FOUR, CERVICAL FOUR-FIVE, CERVICAL FIVE-SIX, CERVICAL SIX-SEVEN ANTERIOR CERVICAL DECOMPRESSION/DISCECTOMY FUSION;  Surgeon: Earnie Larsson, MD;  Location: [facility];  Service: Neurosurgery;  Laterality: N/A;  . ARTERY REPAIR Left 08/15/2018   Procedure: LEFT  BRACHIAL ARTERY EXPLORATION;  Surgeon: Waynetta Sandy, MD;  Location: [facility];  Service: Vascular;  Laterality: Left;  . Breast Ultrasound Left 04/16/13   Done @ breast center Impression: no malignancy appearance noted on the screen study is consistent with a summation shadow  . CARDIAC CATHETERIZATION    . CAROTID-SUBCLAVIAN BYPASS GRAFT Left 06/13/2018   Procedure: BYPASS GRAFT CAROTID-SUBCLAVIAN;  Surgeon: Waynetta Sandy, MD;  Location: [facility];  Service: Vascular;  Laterality: Left;  . COLONOSCOPY    . DG TUMB RIGHT HAND Right    cyst removal  . IR THORACENTESIS ASP PLEURAL SPACE W/IMG GUIDE  05/31/2018  . JOINT REPLACEMENT    . THORACIC AORTIC ENDOVASCULAR STENT GRAFT Left 06/13/2018   Procedure: LEFT  SUBCLAVIAN TO CAROTID ARTERY TRANSPOSITION; THORACIC AORTIC ENDOVASCULAR STENT GRAFT using GORE CONFORMABLE THORACIC STENT GRAFT AND GORE TAG THORACIC ENDOPROSTHESIS; STENT LEFT COMMON CAROTID ARTERY, RIGHT COMMON-FEMORAL ENDARTERECTOMY WITH PATCH ANGIOPLASTY;  Surgeon: Waynetta Sandy, MD;  Location: [facility];  Service: Vascular;  Laterality: Left;  . TONSILLECTOMY    . TOTAL HIP ARTHROPLASTY Left 01/03/2013   Procedure: TOTAL HIP ARTHROPLASTY ANTERIOR APPROACH;  Surgeon: Mcarthur Rossetti, MD;  Location: [facility];  Service: Orthopedics;  Laterality: Left;  Left Total Hip Arthroplasty, Anterior Approach  . TUBAL LIGATION    . VAGINA RECONSTRUCTION SURGERY  1966   vagina had closed up when 69 years old  . WOUND EXPLORATION Left 06/16/2018   Procedure: LEFT NECK WOUND EXPLORATION, CHEST TUBE INSERTION;  Surgeon: Waynetta Sandy, MD;  [facility];  Service: Vascular;  Laterality: Left;   Family History  Problem Relation Age of Onset  . Heart disease Father   . Diabetes Mother   . Heart disease Mother        pacemaker  . Heart attack Mother 30  . Diabetes Sister   . Alcohol abuse Other   . Heart disease Other   . Hyperlipidemia Other   .  Hypertension Other   . Diabetes Other   . Breast cancer Other   . Colon cancer Neg Hx   . Esophageal cancer Neg Hx   . Stomach cancer Neg Hx   . Rectal cancer Neg Hx    Social History   Socioeconomic History  . Marital status: Single    Spouse name: Not on file  . Number of children: 2  . Years of education: Not on file  . Highest education level: Not on file  Occupational History  . Occupation: retired  Tobacco Use  . Smoking status: Never Smoker  . Smokeless tobacco: Never Used  Vaping Use  . Vaping Use: Never used  Substance and Sexual Activity  . Alcohol use: Not Currently  . Drug use: Never  . Sexual activity: Not Currently  Other Topics Concern  . Not on file  Social History Narrative  . Not on file  Social Determinants of Health   Financial Resource Strain:   . Difficulty of Paying Living Expenses:   Food Insecurity:   . Worried About Charity fundraiser in the Last Year:   . Arboriculturist in the Last Year:   Transportation Needs: Unmet Transportation Needs  . Lack of Transportation (Medical): Yes  . Lack of Transportation (Non-Medical): No  Physical Activity:   . Days of Exercise per Week:   . Minutes of Exercise per Session:   Stress:   . Feeling of Stress :   Social Connections:   . Frequency of Communication with Friends and Family:   . Frequency of Social Gatherings with Friends and Family:   . Attends Religious Services:   . Active Member of Clubs or Organizations:   . Attends Archivist Meetings:   Marland Kitchen Marital Status:     Tobacco Counseling Counseling given: No   Clinical Intake:  Pre-visit preparation completed: Yes  Pain : No/denies pain Pain Score: 0-No pain     BMI - recorded: 28.32 Nutritional Status: BMI 25 -29 Overweight Nutritional Risks: None Diabetes: No  How often do you need to have someone help you when you read instructions, pamphlets, or other written materials from your doctor or pharmacy?: 1 -  Never What is the last grade level you completed in school?: Some college  Diabetic? No  Interpreter Needed?: No  Information entered by :: Lizbet Cirrincione N. Melaney Tellefsen, LPN   Activities of Daily Living In your present state of health, do you have any difficulty performing the following activities: 04/12/2020 01/29/2020  Hearing? N N  Vision? N N  Difficulty concentrating or making decisions? N N  Walking or climbing stairs? N Y  Dressing or bathing? N N  Doing errands, shopping? N N  Preparing Food and eating ? N -  Using the Toilet? N -  In the past six months, have you accidently leaked urine? N -  Do you have problems with loss of bowel control? N -  Managing your Medications? N -  Managing your Finances? N -  Housekeeping or managing your Housekeeping? N -  Some recent data might be hidden    Patient Care Team: Hoyt Koch, MD as PCP - General (Internal Medicine) [name], MD as PCP - Cardiology (Cardiology) Mcarthur Rossetti, MD (Orthopedic Surgery) Tanda Rockers, MD (Pulmonary Disease) Inda Castle, MD (Inactive) (Gastroenterology) Earnie Larsson, MD as Consulting Physician (Neurosurgery) Charlton Haws, [facility] as Pharmacist (Pharmacist) [name], [place] (Optometry)  Indicate any recent Medical Services you may have received from other than [facility] providers in the past year (date may be approximate).     Assessment:   This is a routine wellness examination for [place].  Hearing/Vision screen No exam data present  Dietary issues and exercise activities discussed: Current Exercise Habits: The patient does not participate in regular exercise at present, Exercise limited by: neurologic condition(s);orthopedic condition(s);cardiac condition(s)  Goals    .  Atrial fibrillation: improve medication safety      CARE PLAN ENTRY  Current Barriers:  . Chronic Disease Management support, education, and care coordination needs related to Atrial  Fibrillation . Current regimen: Xarelto 20 mg daily, diltiazem 240 mg daily, digoxin 0.125 mg daily . Digoxin level 10/20/19: 0.5 ng/mL (goal range 0.5 - 1.0)  Pharmacist Clinical Goal(s):  Marland Kitchen Over the next 120 days, patient will work with PharmD and providers towards optimized anticoagulation therapy  Interventions: . Comprehensive medication  review performed. . Discussed signs and symptoms of bleeding . Discussed benefits and risks of digoxin o Consider discussing possibly stopping digoxin with cardiologist given low disease burden with Afib and also taking diltiazem  Patient Self Care Activities:  . Self administers medications as prescribed and Calls provider office for new concerns or questions . Patient will monitor for signs and symptoms of bleeding and digoxin toxicity . Patient will discuss continued digoxin therapy with cardiologist  Initial goal documentation    .  Cholesterol: goal LDL < 100      CARE PLAN ENTRY (see longitudinal plan of care for additional care plan information)  Current Barriers:  . Uncontrolled hyperlipidemia, complicated by CAD, Afib,  . Current antihyperlipidemic regimen: rosuvastatin 20 mg daily . Previous antihyperlipidemic medications tried n/a . Most recent lipid panel:     Component Value Date/Time   CHOL 189 01/03/2018 1044   TRIG 128.0 01/03/2018 1044   HDL 46.70 01/03/2018 1044   CHOLHDL 4 01/03/2018 1044   VLDL 25.6 01/03/2018 1044   LDLCALC 117 (H) 01/03/2018 1044   . ASCVD risk enhancing conditions: age >38, HTN, history of MI  Pharmacist Clinical Goal(s):  Marland Kitchen Over the next 120 days, patient will work with PharmD and providers towards optimized antihyperlipidemic therapy  Interventions: . Discussed benefits of rosuvastatin for cholesterol lowering and prevention of heart attack and stroke . Discussed importance of heart-healthy diet including benefits of fish    Patient Self Care Activities:  . Patient will focus on  medication adherence by pill box . Patient will focus on dietary modifications  Initial goal documentation     .  Hypertension: goal BP < 130/80      CARE PLAN ENTRY (see longitudinal plan of care for additional care plan information)  Current Barriers:  . Uncontrolled hypertension, complicated by Afib, HLD, CAD . Current antihypertensive regimen: diltiazem 240 mg qAM, furosemide 20 mg daily . Last practice recorded BP readings:  BP Readings from Last 3 Encounters:  02/03/20 124/65  01/29/20 (!) 154/84  12/08/19 139/79 .  Current home BP readings: n/a  Pharmacist Clinical Goal(s):  Marland Kitchen Over the next 120 days, patient will work with PharmD and providers to optimize antihypertensive regimen to maintain BP < 130/80  Interventions: . Inter-disciplinary care team collaboration (see longitudinal plan of care) . Comprehensive medication review performed; medication list updated in the electronic medical record.  . Discussed benefits of BP monitoring at home  Patient Self Care Activities:  . Patient will continue to check BP weekly , document, and provide at future appointments . Patient will focus on medication adherence by pill box  Initial goal documentation     .  I would like to lose 15 pounds and firm up my body      I will continue to exercise and eat healthy    .  Patient Stated      Increase my physical activity by working-out. Eat small frequent meals and not skip meals. Enjoy life, family and worship God.     .  Patient Stated (pt-stated)      Would like to lose 16 pounds and get more active in walking.      Depression Screen PHQ 2/9 Scores 04/12/2020 04/08/2019 03/28/2018 03/21/2017  PHQ - 2 Score 0 0 1 0  PHQ- 9 Score - - 4 -    Fall Risk Fall Risk  04/12/2020 04/08/2019 03/28/2018 03/21/2017  Falls in the past year? 0 0 No No  Number falls  in past yr: 0 0 - -  Injury with Fall? 0 - - -  Risk for fall due to : No Fall Risks Impaired mobility;Impaired vision - -  Follow  up Falls evaluation completed;Education provided - - -    Any stairs in or around the home? Yes  If so, are there any without handrails? Yes  Home free of loose throw rugs in walkways, pet beds, electrical cords, etc? Yes  Adequate lighting in your home to reduce risk of falls? Yes   ASSISTIVE DEVICES UTILIZED TO PREVENT FALLS:  Life alert? No  Use of a cane, walker or w/c? Yes  Grab bars in the bathroom? No  Shower chair or bench in shower? Yes  Elevated toilet seat or a handicapped toilet? Yes   TIMED UP AND GO:  Was the test performed? No .  Length of time to ambulate 10 feet: 0 sec.   Gait steady and fast without use of assistive device  Cognitive Function:     6CIT Screen 04/12/2020  What Year? 0 points  What month? 0 points  What time? 0 points  Count back from 20 0 points  Months in reverse 0 points  Repeat phrase 0 points  Total Score 0    Immunizations Immunization History  Administered Date(s) Administered  . Fluad Quad(high Dose 65+) 07/02/2019  . Influenza Whole 07/23/2012  . Influenza, High Dose Seasonal PF 07/04/2018  . Influenza,inj,Quad PF,6+ Mos 07/16/2013, 06/19/2016  . Pneumococcal Conjugate-13 01/03/2018  . Pneumococcal Polysaccharide-23 04/08/2019  . Td 10/24/2011    TDAP status: Up to date Flu Vaccine status: Up to date Pneumococcal vaccine status: Up to date Covid-19 vaccine status: Declined, Education has been provided regarding the importance of this vaccine but patient still declined. Advised may receive this vaccine at local pharmacy or Health Dept.or vaccine clinic. Aware to provide a copy of the vaccination record if obtained from local pharmacy or Health Dept. Verbalized acceptance and understanding.  Qualifies for Shingles Vaccine? Yes   Zostavax completed No   Shingrix Completed?: No.    Education has been provided regarding the importance of this vaccine. Patient has been advised to call insurance company to determine out of  pocket expense if they have not yet received this vaccine. Advised may also receive vaccine at local pharmacy or Health Dept. Verbalized acceptance and understanding.  Screening Tests Health Maintenance  Topic Date Due  . COVID-19 Vaccine (1) Never done  . INFLUENZA VACCINE  05/23/2020  . MAMMOGRAM  08/27/2021  . TETANUS/TDAP  10/23/2021  . COLONOSCOPY  09/15/2026  . DEXA SCAN  Completed  . Hepatitis C Screening  Completed  . PNA vac Low Risk Adult  Completed    Health Maintenance  Health Maintenance Due  Topic Date Due  . COVID-19 Vaccine (1) Never done    Colorectal cancer screening: Completed 09/16/2019. Repeat every 7 years Mammogram status: Completed 08/28/2019. Repeat every year Bone Density status: Ordered 04/12/2020. Pt provided with contact info and advised to call to schedule appt.  Lung Cancer Screening: (Low Dose CT Chest recommended if Age 24-80 years, 30 pack-year currently smoking OR have quit w/in 15years.) does not qualify.   Lung Cancer Screening Referral: no  Additional Screening:  Hepatitis C Screening: does not qualify; Completed Yes  Vision Screening: Recommended annual ophthalmology exams for early detection of glaucoma and other disorders of the eye. Is the patient up to date with their annual eye exam?  Yes  Who is the provider or  what is the name of the office in which the patient attends annual eye exams? [facility] If pt is not established with a provider, would they like to be referred to a provider to establish care? No .   Dental Screening: Recommended annual dental exams for proper oral hygiene  Community Resource Referral / Chronic Care Management: CRR required this visit?  No   CCM required this visit?  No      Plan:     Reviewed health maintenance screenings with patient today and relevant education, vaccines, and/or referrals were provided.    Continue doing brain stimulating activities (puzzles, reading, adult coloring books,  staying active) to keep memory sharp.    Continue to eat heart healthy diet (full of fruits, vegetables, whole grains, lean protein, water--limit salt, fat, and sugar intake) and increase physical activity as tolerated.  I have personally reviewed and noted the following in the patient's chart:   . Medical and social history . Use of alcohol, tobacco or illicit drugs  . Current medications and supplements . Functional ability and status . Nutritional status . Physical activity . Advanced directives . List of other physicians . Hospitalizations, surgeries, and ER visits in previous 12 months . Vitals . Screenings to include cognitive, depression, and falls . Referrals and appointments  In addition, I have reviewed and discussed with patient certain preventive protocols, quality metrics, and best practice recommendations. A written personalized care plan for preventive services as well as general preventive health recommendations were provided to patient.     Sheral Flow, LPN   [id number]   Nurse Notes: Patient aware to schedule bone density scan with [facility].

## 2020-04-12 NOTE — Patient Instructions (Signed)
Samantha Stevenson , Thank you for taking time to come for your Medicare Wellness Visit. I appreciate your ongoing commitment to your health goals. Please review the following plan we discussed and let me know if I can assist you in the future.   Screening recommendations/referrals: Colonoscopy: last done 09/16/2019; due 08/2026 Mammogram: last done 08/28/2019; due 08/2020 Bone Density: last done 03/28/2017; due every 2-3 years Recommended yearly ophthalmology/optometry visit for glaucoma screening and checkup Recommended yearly dental visit for hygiene and checkup  Vaccinations: Influenza vaccine: 07/02/2019; due every year Pneumococcal vaccine: completed Tdap vaccine: 10/24/2011; due every 10 years Shingles vaccine: declined Covid-19: declined  Advanced directives: Advance directive discussed with you today. Even though you declined this today please call our office should you change your mind and we can give you the proper paperwork for you to fill out.  Conditions/risks identified:  Please continue to do your personal lifestyle choices by: daily care of teeth and gums, regular physical activity (goal should be 5 days a week for 30 minutes), eat a healthy diet, avoid tobacco and drug use, limiting any alcohol intake, taking a low-dose aspirin (if not allergic or have been advised by your provider otherwise) and taking vitamins and minerals as recommended by your provider. Continue doing brain stimulating activities (puzzles, reading, adult coloring books, staying active) to keep memory sharp. Continue to eat heart healthy diet (full of fruits, vegetables, whole grains, lean protein, water--limit salt, fat, and sugar intake) and increase physical activity as tolerated.  Next appointment: Please schedule your next Medicare Wellness Visit with your Nurse Health Advisor in 1 year.   Preventive Care 62 Years and Older, Female Preventive care refers to lifestyle choices and visits with your health care  provider that can promote health and wellness. What does preventive care include?  A yearly physical exam. This is also called an annual well check.  Dental exams once or twice a year.  Routine eye exams. Ask your health care provider how often you should have your eyes checked.  Personal lifestyle choices, including:  Daily care of your teeth and gums.  Regular physical activity.  Eating a healthy diet.  Avoiding tobacco and drug use.  Limiting alcohol use.  Practicing safe sex.  Taking low-dose aspirin every day.  Taking vitamin and mineral supplements as recommended by your health care provider. What happens during an annual well check? The services and screenings done by your health care provider during your annual well check will depend on your age, overall health, lifestyle risk factors, and family history of disease. Counseling  Your health care provider may ask you questions about your:  Alcohol use.  Tobacco use.  Drug use.  Emotional well-being.  Home and relationship well-being.  Sexual activity.  Eating habits.  History of falls.  Memory and ability to understand (cognition).  Work and work Statistician.  Reproductive health. Screening  You may have the following tests or measurements:  Height, weight, and BMI.  Blood pressure.  Lipid and cholesterol levels. These may be checked every 5 years, or more frequently if you are over 35 years old.  Skin check.  Lung cancer screening. You may have this screening every year starting at age 90 if you have a 30-pack-year history of smoking and currently smoke or have quit within the past 15 years.  Fecal occult blood test (FOBT) of the stool. You may have this test every year starting at age 88.  Flexible sigmoidoscopy or colonoscopy. You may have a sigmoidoscopy  every 5 years or a colonoscopy every 10 years starting at age 91.  Hepatitis C blood test.  Hepatitis B blood test.  Sexually  transmitted disease (STD) testing.  Diabetes screening. This is done by checking your blood sugar (glucose) after you have not eaten for a while (fasting). You may have this done every 1-3 years.  Bone density scan. This is done to screen for osteoporosis. You may have this done starting at age 70.  Mammogram. This may be done every 1-2 years. Talk to your health care provider about how often you should have regular mammograms. Talk with your health care provider about your test results, treatment options, and if necessary, the need for more tests. Vaccines  Your health care provider may recommend certain vaccines, such as:  Influenza vaccine. This is recommended every year.  Tetanus, diphtheria, and acellular pertussis (Tdap, Td) vaccine. You may need a Td booster every 10 years.  Zoster vaccine. You may need this after age 35.  Pneumococcal 13-valent conjugate (PCV13) vaccine. One dose is recommended after age 76.  Pneumococcal polysaccharide (PPSV23) vaccine. One dose is recommended after age 77. Talk to your health care provider about which screenings and vaccines you need and how often you need them. This information is not intended to replace advice given to you by your health care provider. Make sure you discuss any questions you have with your health care provider. Document Released: 11/05/2015 Document Revised: 06/28/2016 Document Reviewed: 08/10/2015 Elsevier Interactive Patient Education  2017 [employer] Prevention in the Home Falls can cause injuries. They can happen to people of all ages. There are many things you can do to make your home safe and to help prevent falls. What can I do on the outside of my home?  Regularly fix the edges of walkways and driveways and fix any cracks.  Remove anything that might make you trip as you walk through a door, such as a raised step or threshold.  Trim any bushes or trees on the path to your home.  Use bright outdoor  lighting.  Clear any walking paths of anything that might make someone trip, such as rocks or tools.  Regularly check to see if handrails are loose or broken. Make sure that both sides of any steps have handrails.  Any raised decks and porches should have guardrails on the edges.  Have any leaves, snow, or ice cleared regularly.  Use sand or salt on walking paths during winter.  Clean up any spills in your garage right away. This includes oil or grease spills. What can I do in the bathroom?  Use night lights.  Install grab bars by the toilet and in the tub and shower. Do not use towel bars as grab bars.  Use non-skid mats or decals in the tub or shower.  If you need to sit down in the shower, use a plastic, non-slip stool.  Keep the floor dry. Clean up any water that spills on the floor as soon as it happens.  Remove soap buildup in the tub or shower regularly.  Attach bath mats securely with double-sided non-slip rug tape.  Do not have throw rugs and other things on the floor that can make you trip. What can I do in the bedroom?  Use night lights.  Make sure that you have a light by your bed that is easy to reach.  Do not use any sheets or blankets that are too big for your bed. They should  not hang down onto the floor.  Have a firm chair that has side arms. You can use this for support while you get dressed.  Do not have throw rugs and other things on the floor that can make you trip. What can I do in the kitchen?  Clean up any spills right away.  Avoid walking on wet floors.  Keep items that you use a lot in easy-to-reach places.  If you need to reach something above you, use a strong step stool that has a grab bar.  Keep electrical cords out of the way.  Do not use floor polish or wax that makes floors slippery. If you must use wax, use non-skid floor wax.  Do not have throw rugs and other things on the floor that can make you trip. What can I do with my  stairs?  Do not leave any items on the stairs.  Make sure that there are handrails on both sides of the stairs and use them. Fix handrails that are broken or loose. Make sure that handrails are as long as the stairways.  Check any carpeting to make sure that it is firmly attached to the stairs. Fix any carpet that is loose or worn.  Avoid having throw rugs at the top or bottom of the stairs. If you do have throw rugs, attach them to the floor with carpet tape.  Make sure that you have a light switch at the top of the stairs and the bottom of the stairs. If you do not have them, ask someone to add them for you. What else can I do to help prevent falls?  Wear shoes that:  Do not have high heels.  Have rubber bottoms.  Are comfortable and fit you well.  Are closed at the toe. Do not wear sandals.  If you use a stepladder:  Make sure that it is fully opened. Do not climb a closed stepladder.  Make sure that both sides of the stepladder are locked into place.  Ask someone to hold it for you, if possible.  Clearly mark and make sure that you can see:  Any grab bars or handrails.  First and last steps.  Where the edge of each step is.  Use tools that help you move around (mobility aids) if they are needed. These include:  Canes.  Walkers.  Scooters.  Crutches.  Turn on the lights when you go into a dark area. Replace any light bulbs as soon as they burn out.  Set up your furniture so you have a clear path. Avoid moving your furniture around.  If any of your floors are uneven, fix them.  If there are any pets around you, be aware of where they are.  Review your medicines with your doctor. Some medicines can make you feel dizzy. This can increase your chance of falling. Ask your doctor what other things that you can do to help prevent falls. This information is not intended to replace advice given to you by your health care provider. Make sure you discuss any  questions you have with your health care provider. Document Released: 08/05/2009 Document Revised: 03/16/2016 Document Reviewed: 11/13/2014 Elsevier Interactive Patient Education  2017 [employer].

## 2020-04-14 ENCOUNTER — Other Ambulatory Visit: Payer: Self-pay

## 2020-04-14 DIAGNOSIS — I7100 Dissection of unspecified site of aorta: Secondary | ICD-10-CM

## 2020-04-20 ENCOUNTER — Other Ambulatory Visit: Payer: Self-pay

## 2020-04-20 DIAGNOSIS — I7100 Dissection of unspecified site of aorta: Secondary | ICD-10-CM

## 2020-04-28 ENCOUNTER — Other Ambulatory Visit: Payer: Self-pay

## 2020-04-28 DIAGNOSIS — I7100 Dissection of unspecified site of aorta: Secondary | ICD-10-CM

## 2020-04-28 DIAGNOSIS — M79603 Pain in arm, unspecified: Secondary | ICD-10-CM

## 2020-04-28 DIAGNOSIS — M7989 Other specified soft tissue disorders: Secondary | ICD-10-CM

## 2020-04-29 ENCOUNTER — Telehealth: Payer: Self-pay

## 2020-04-29 NOTE — Telephone Encounter (Signed)
Orders for CTA Chest, Abdomen and Pelvis were faxed to [facility] at [phone], per her request.  Thurston Hole., LPN.

## 2020-04-30 ENCOUNTER — Other Ambulatory Visit: Payer: Self-pay

## 2020-04-30 ENCOUNTER — Ambulatory Visit (HOSPITAL_COMMUNITY)
Admission: RE | Admit: 2020-04-30 | Discharge: 2020-04-30 | Disposition: A | Discharge status: Home / Self Care | Payer: Medicare Other | Source: Ambulatory Visit | Attending: Vascular Surgery | Admitting: Vascular Surgery

## 2020-04-30 DIAGNOSIS — I7100 Dissection of unspecified site of aorta: Secondary | ICD-10-CM | POA: Diagnosis present

## 2020-04-30 LAB — POCT I-STAT CREATININE: Creatinine, Ser: 0.9 mg/dL (ref 0.44–1.00)

## 2020-04-30 MED ORDER — SODIUM CHLORIDE (PF) 0.9 % IJ SOLN
INTRAMUSCULAR | Status: AC
  Filled 2020-04-30: qty 50

## 2020-04-30 MED ORDER — IOHEXOL 350 MG/ML SOLN
100.0000 mL | Freq: Once | INTRAVENOUS | Status: AC | PRN
  Administered 2020-04-30: 100 mL via INTRAVENOUS

## 2020-05-03 ENCOUNTER — Other Ambulatory Visit: Payer: Self-pay

## 2020-05-03 MED ORDER — RIVAROXABAN 20 MG PO TABS: ORAL_TABLET | ORAL | 1 refills | Status: DC

## 2020-05-03 NOTE — Telephone Encounter (Signed)
Xarelto 20mg  refill request received. Pt is 69 years old, weight-74.8 kg, Crea-0.90 on 04/30/2020, last seen by Tommye Standard on 11/18/2019, Diagnosis-Afib, CrCl-71.32ml/min; Dose is appropriate based on dosing criteria. Will send in refill to requested pharmacy.

## 2020-05-04 ENCOUNTER — Ambulatory Visit (INDEPENDENT_AMBULATORY_CARE_PROVIDER_SITE_OTHER): Payer: Medicare Other | Admitting: Cardiology

## 2020-05-04 ENCOUNTER — Other Ambulatory Visit: Payer: Self-pay

## 2020-05-04 ENCOUNTER — Encounter: Payer: Self-pay | Admitting: Cardiology

## 2020-05-04 VITALS — BP 144/78 | HR 65 | Ht 64.0 in | Wt 165.0 lb

## 2020-05-04 DIAGNOSIS — I480 Paroxysmal atrial fibrillation: Secondary | ICD-10-CM

## 2020-05-04 MED ORDER — RIVAROXABAN 20 MG PO TABS: ORAL_TABLET | ORAL | 3 refills | Status: DC

## 2020-05-04 NOTE — Progress Notes (Signed)
Electrophysiology Office Note   Date:  05/04/2020   ID:  Samantha Stevenson, DOB [birth year], MRN [id number]  PCP:  Hoyt Koch, MD  Primary Electrophysiologist:  [name], MD    No chief complaint on file.    History of Present Illness: Samantha Stevenson is a 69 y.o. female who presents today for electrophysiology evaluation.   She is a longstanding history of atrial fibrillation.  She says that she has had palpitations since the age of 87, but was diagnosed with atrial fibrillation in the 85s.  She is currently on Xarelto.  She was diagnosed with an acute intramural hematoma of her thoracic aorta extending from the left subclavian artery to the renal arteries.  She is followed by vascular surgery.  She remains on Xarelto.  Today, denies symptoms of palpitations, chest pain, shortness of breath, orthopnea, PND, lower extremity edema, claudication, dizziness, presyncope, syncope, bleeding, or neurologic sequela. The patient is tolerating medications without difficulties.  Today she feels well.  She has no chest pain or shortness of breath.  She is able to do all of her daily activities without restriction.   Past Medical History:  Diagnosis Date  . Allergy   . Anemia   . Arthritis    "qwhere" (07/29/2018)  . Atrial fibrillation ([place])   . CHF (congestive heart failure) ([place])   . Clotting disorder ([place])   . Degenerative arthritis of hip    s/p L THR 12/2012  . Dyspnea    since surgery in August 2019- "when I get worked up and walk a short distance"  . Dysrhythmia   . History of blood transfusion 05/2018   "related to OR"  . Hyperlipidemia   . Hypertension   . Intramural hematoma of thoracic aorta ([place]) 06/13/2018  . Migraines    mIgraines- none since blood pressure and lipids are under control  . Myocardial infarction ([place]) 2000   due to atrial fib  . Neuromuscular disorder ([place])    pinched nerve- left side of neck  . Pulmonary emboli ([place]) 12/2012 dx    post op (L THR), anticoag x 32mo   Past Surgical History:  Procedure Laterality Date  . ABDOMINAL HYSTERECTOMY     "w/1 tube"  . ANTERIOR CERVICAL DECOMPRESSION/DISCECTOMY FUSION 4 LEVELS N/A 02/02/2020   Procedure: CERVICAL THREE-FOUR, CERVICAL FOUR-FIVE, CERVICAL FIVE-SIX, CERVICAL SIX-SEVEN ANTERIOR CERVICAL DECOMPRESSION/DISCECTOMY FUSION;  Surgeon: Earnie Larsson, MD;  Location: [facility];  Service: Neurosurgery;  Laterality: N/A;  . ARTERY REPAIR Left 08/15/2018   Procedure: LEFT BRACHIAL ARTERY EXPLORATION;  Surgeon: Waynetta Sandy, MD;  Location: [facility];  Service: Vascular;  Laterality: Left;  . Breast Ultrasound Left 04/16/13   Done @ breast center Impression: no malignancy appearance noted on the screen study is consistent with a summation shadow  . CARDIAC CATHETERIZATION    . CAROTID-SUBCLAVIAN BYPASS GRAFT Left 06/13/2018   Procedure: BYPASS GRAFT CAROTID-SUBCLAVIAN;  Surgeon: Waynetta Sandy, MD;  Location: [facility];  Service: Vascular;  Laterality: Left;  . COLONOSCOPY    . DG TUMB RIGHT HAND Right    cyst removal  . IR THORACENTESIS ASP PLEURAL SPACE W/IMG GUIDE  05/31/2018  . JOINT REPLACEMENT    . THORACIC AORTIC ENDOVASCULAR STENT GRAFT Left 06/13/2018   Procedure: LEFT  SUBCLAVIAN TO CAROTID ARTERY TRANSPOSITION; THORACIC AORTIC ENDOVASCULAR STENT GRAFT using GORE CONFORMABLE THORACIC STENT GRAFT AND GORE TAG THORACIC ENDOPROSTHESIS; STENT LEFT COMMON CAROTID ARTERY, RIGHT COMMON-FEMORAL ENDARTERECTOMY WITH PATCH ANGIOPLASTY;  Surgeon: Waynetta Sandy, MD;  Location: [facility];  Service: Vascular;  Laterality: Left;  . TONSILLECTOMY    . TOTAL HIP ARTHROPLASTY Left 01/03/2013   Procedure: TOTAL HIP ARTHROPLASTY ANTERIOR APPROACH;  Surgeon: Mcarthur Rossetti, MD;  Location: [facility];  Service: Orthopedics;  Laterality: Left;  Left Total Hip Arthroplasty, Anterior Approach  . TUBAL LIGATION    . VAGINA RECONSTRUCTION SURGERY  1966   vagina had closed up when 69  years old  . WOUND EXPLORATION Left 06/16/2018   Procedure: LEFT NECK WOUND EXPLORATION, CHEST TUBE INSERTION;  Surgeon: Waynetta Sandy, MD;  Location: [facility];  Service: Vascular;  Laterality: Left;     Current Outpatient Medications  Medication Sig Dispense Refill  . acetaminophen (TYLENOL) 500 MG tablet Take 1,000 mg by mouth as needed for moderate pain.     Marland Kitchen aspirin EC 81 MG tablet Take 81 mg by mouth daily.    . digoxin (LANOXIN) 0.25 MG tablet Take 0.5 tablets (125 mcg total) by mouth every morning. 90 tablet 1  . diltiazem (DILACOR XR) 240 MG 24 hr capsule TAKE 1 CAPSULE(240 MG) BY MOUTH EVERY MORNING 90 capsule 1  . ferrous sulfate 325 (65 FE) MG tablet Take 325 mg by mouth once a week.     . furosemide (LASIX) 20 MG tablet Take 1 tablet (20 mg total) by mouth daily. 90 tablet 1  . Multiple Vitamin (MULTIVITAMIN) tablet Take 2 tablets by mouth daily. Gummies    . rivaroxaban (XARELTO) 20 MG TABS tablet TAKE 1 TABLET(20 MG) BY MOUTH DAILY WITH SUPPER 90 tablet 1  . rosuvastatin (CRESTOR) 20 MG tablet TAKE 1 TABLET(20 MG) BY MOUTH DAILY 90 tablet 1   No current facility-administered medications for this visit.    Allergies:   Penicillins, Shellfish allergy, Latex, Banana, Oxycodone, Asa [aspirin], Celebrex [celecoxib], and Nsaids   Social History:  The patient  reports that she has never smoked. She has never used smokeless tobacco. She reports previous alcohol use. She reports that she does not use drugs.   Family History:  The patient's family history includes Alcohol abuse in an other family member; Breast cancer in an other family member; Diabetes in her mother, sister, and another family member; Heart attack (age of onset: 68) in her mother; Heart disease in her father, mother, and another family member; Hyperlipidemia in an other family member; Hypertension in an other family member.    ROS:  Please see the history of present illness.   Otherwise, review of systems is  positive for none.   All other systems are reviewed and negative.   PHYSICAL EXAM: VS:  BP (!) 144/78   Pulse 65   Ht 5\' 4"  (1.626 m)   Wt 165 lb (74.8 kg)   SpO2 97%   BMI 28.32 kg/m  , BMI Body mass index is 28.32 kg/m. GEN: Well nourished, well developed, in no acute distress  HEENT: normal  Neck: no JVD, carotid bruits, or masses Cardiac: RRR; no murmurs, rubs, or gallops,no edema  Respiratory:  clear to auscultation bilaterally, normal work of breathing GI: soft, nontender, nondistended, + BS MS: no deformity or atrophy  Skin: warm and dry Neuro:  Strength and sensation are intact Psych: euthymic mood, full affect  EKG:  EKG is ordered today. Personal review of the ekg ordered shows sinus rhythm, rate 65  Recent Labs: 05/16/2019: ALT 13 01/29/2020: BUN 11; Hemoglobin 11.3; Platelets 297; Potassium 4.1; Sodium 139 04/30/2020: Creatinine, Ser 0.90    Lipid Panel  Component Value Date/Time   CHOL 189 01/03/2018 1044   TRIG 128.0 01/03/2018 1044   HDL 46.70 01/03/2018 1044   CHOLHDL 4 01/03/2018 1044   VLDL 25.6 01/03/2018 1044   LDLCALC 117 (H) 01/03/2018 1044     Wt Readings from Last 3 Encounters:  05/04/20 165 lb (74.8 kg)  04/12/20 165 lb (74.8 kg)  02/02/20 164 lb (74.4 kg)      Other studies Reviewed: Additional studies/ records that were reviewed today include: pcp notes   ASSESSMENT AND PLAN:  1.  Paroxysmal atrial fibrillation: CHA2DS2-VASc of 2.  Currently on diltiazem, digoxin, Xarelto.  We Lino Wickliff stop her digoxin today and refill her Xarelto.  She remains in sinus rhythm and does not feel that she has had much in the way of atrial fibrillation over the past few years.  2.  Hypertension: Mildly elevated today.  She has had to take her medications.  No changes.  3.  Hyperlipidemia: Continue Crestor.  Current medicines are reviewed at length with the patient today.   The patient does not have concerns regarding her medicines.  The following  changes were made today: Stop digoxin  Labs/ tests ordered today include:  Orders Placed This Encounter  Procedures  . EKG 12-Lead     Disposition:   FU with Terelle Dobler 12 months  Signed, Sharice Harriss Meredith Leeds, MD  05/04/2020 10:44 AM     [facility] [address] [place] [address] [phone] (office) [phone] (fax)

## 2020-05-04 NOTE — Patient Instructions (Signed)
Medication Instructions:  Your physician has recommended you make the following change in your medication:  1.  STOP Digoxin  *If you need a refill on your cardiac medications before your next appointment, please call your pharmacy*   Lab Work: None ordered If you have labs (blood work) drawn today and your tests are completely normal, you will receive your results only by: Marland Kitchen MyChart Message (if you have MyChart) OR . A paper copy in the mail If you have any lab test that is abnormal or we need to change your treatment, we will call you to review the results.   Testing/Procedures: None ordered   Follow-Up: At [facility], you and your health needs are our priority.  As part of our continuing mission to provide you with exceptional heart care, we have created designated Provider Care Teams.  These Care Teams include your primary Cardiologist (physician) and Advanced Practice Providers (APPs -  Physician Assistants and Nurse Practitioners) who all work together to provide you with the care you need, when you need it.  We recommend signing up for the patient portal called "MyChart".  Sign up information is provided on this After Visit Summary.  MyChart is used to connect with patients for Virtual Visits (Telemedicine).  Patients are able to view lab/test results, encounter notes, upcoming appointments, etc.  Non-urgent messages can be sent to your provider as well.   To learn more about what you can do with MyChart, go to [email or web address].    Your next appointment:   1 year(s)  The format for your next appointment:   In Person  Provider:   Tommye Standard, PA-C   Thank you for choosing [facility]!!   Trinidad Curet, RN [phone]    Other Instructions

## 2020-05-07 ENCOUNTER — Ambulatory Visit (INDEPENDENT_AMBULATORY_CARE_PROVIDER_SITE_OTHER): Payer: Medicare Other | Admitting: Vascular Surgery

## 2020-05-07 ENCOUNTER — Encounter: Payer: Self-pay | Admitting: Vascular Surgery

## 2020-05-07 ENCOUNTER — Other Ambulatory Visit: Payer: Self-pay

## 2020-05-07 VITALS — BP 124/79 | HR 67 | Temp 97.8°F | Resp 20 | Ht 64.0 in | Wt 163.0 lb

## 2020-05-07 DIAGNOSIS — I7100 Dissection of unspecified site of aorta: Secondary | ICD-10-CM

## 2020-05-07 NOTE — Progress Notes (Signed)
Patient ID: Samantha Stevenson, female   DOB: 10/14/1951, 69 y.o.   MRN: [id number]  Reason for Consult: Follow-up   Referred by Hoyt Koch, *  Subjective:     HPI:  Samantha Stevenson is a 69 y.o. female history of thoracic endograft doing and transposition of his clavian artery and common carotid artery stenting for intramural hematoma.  Subsequently had lymphatic leak which was drained and then recovered on medium chain fatty acid diet.  We then did not angiography for pseudoaneurysm but did not find anything.  Since that time she has now had neck surgery from which she has recovered very well.  She has been off of Xarelto for 2 weeks now previously was on for DVT and then had paroxysmal atrial fibrillation.  She does take aspirin daily.  She is at her normal level of activity does not have any complaints related to today's visit.  Past Medical History:  Diagnosis Date  . Allergy   . Anemia   . Arthritis    "qwhere" (07/29/2018)  . Atrial fibrillation ([place])   . CHF (congestive heart failure) ([place])   . Clotting disorder ([place])   . Degenerative arthritis of hip    s/p L THR 12/2012  . Dyspnea    since surgery in August 2019- "when I get worked up and walk a short distance"  . Dysrhythmia   . History of blood transfusion 05/2018   "related to OR"  . Hyperlipidemia   . Hypertension   . Intramural hematoma of thoracic aorta ([place]) 06/13/2018  . Migraines    mIgraines- none since blood pressure and lipids are under control  . Myocardial infarction ([place]) 2000   due to atrial fib  . Neuromuscular disorder ([place])    pinched nerve- left side of neck  . Pulmonary emboli ([place]) 12/2012 dx   post op (L THR), anticoag x 52mo   Family History  Problem Relation Age of Onset  . Heart disease Father   . Diabetes Mother   . Heart disease Mother        pacemaker  . Heart attack Mother 54  . Diabetes Sister   . Alcohol abuse Other   . Heart disease Other   . Hyperlipidemia Other     . Hypertension Other   . Diabetes Other   . Breast cancer Other   . Colon cancer Neg Hx   . Esophageal cancer Neg Hx   . Stomach cancer Neg Hx   . Rectal cancer Neg Hx    Past Surgical History:  Procedure Laterality Date  . ABDOMINAL HYSTERECTOMY     "w/1 tube"  . ANTERIOR CERVICAL DECOMPRESSION/DISCECTOMY FUSION 4 LEVELS N/A 02/02/2020   Procedure: CERVICAL THREE-FOUR, CERVICAL FOUR-FIVE, CERVICAL FIVE-SIX, CERVICAL SIX-SEVEN ANTERIOR CERVICAL DECOMPRESSION/DISCECTOMY FUSION;  Surgeon: Earnie Larsson, MD;  Location: [facility];  Service: Neurosurgery;  Laterality: N/A;  . ARTERY REPAIR Left 08/15/2018   Procedure: LEFT BRACHIAL ARTERY EXPLORATION;  Surgeon: Waynetta Sandy, MD;  Location: [facility];  Service: Vascular;  Laterality: Left;  . Breast Ultrasound Left 04/16/13   Done @ breast center Impression: no malignancy appearance noted on the screen study is consistent with a summation shadow  . CARDIAC CATHETERIZATION    . CAROTID-SUBCLAVIAN BYPASS GRAFT Left 06/13/2018   Procedure: BYPASS GRAFT CAROTID-SUBCLAVIAN;  Surgeon: Waynetta Sandy, MD;  Location: [facility];  Service: Vascular;  Laterality: Left;  . COLONOSCOPY    . DG TUMB RIGHT HAND Right    cyst removal  .  IR THORACENTESIS ASP PLEURAL SPACE W/IMG GUIDE  05/31/2018  . JOINT REPLACEMENT    . THORACIC AORTIC ENDOVASCULAR STENT GRAFT Left 06/13/2018   Procedure: LEFT  SUBCLAVIAN TO CAROTID ARTERY TRANSPOSITION; THORACIC AORTIC ENDOVASCULAR STENT GRAFT using GORE CONFORMABLE THORACIC STENT GRAFT AND GORE TAG THORACIC ENDOPROSTHESIS; STENT LEFT COMMON CAROTID ARTERY, RIGHT COMMON-FEMORAL ENDARTERECTOMY WITH PATCH ANGIOPLASTY;  Surgeon: Waynetta Sandy, MD;  Location: [facility];  Service: Vascular;  Laterality: Left;  . TONSILLECTOMY    . TOTAL HIP ARTHROPLASTY Left 01/03/2013   Procedure: TOTAL HIP ARTHROPLASTY ANTERIOR APPROACH;  Surgeon: Mcarthur Rossetti, MD;  Location: [facility];  Service: Orthopedics;  Laterality:  Left;  Left Total Hip Arthroplasty, Anterior Approach  . TUBAL LIGATION    . VAGINA RECONSTRUCTION SURGERY  1966   vagina had closed up when 69 years old  . WOUND EXPLORATION Left 06/16/2018   Procedure: LEFT NECK WOUND EXPLORATION, CHEST TUBE INSERTION;  Surgeon: Waynetta Sandy, MD;  Location: [facility];  Service: Vascular;  Laterality: Left;    Short Social History:  Social History   Tobacco Use  . Smoking status: Never Smoker  . Smokeless tobacco: Never Used  Substance Use Topics  . Alcohol use: Not Currently    Allergies  Allergen Reactions  . Penicillins Swelling and Other (See Comments)    Severe swelling PATIENT HAS HAD A PCN REACTION WITH IMMEDIATE RASH, FACIAL/TONGUE/THROAT SWELLING, SOB, OR LIGHTHEADEDNESS WITH HYPOTENSION:  #  #  YES  #  #  Has patient had a PCN reaction causing severe rash involving mucus membranes or skin necrosis: No PATIENT HAS HAD A PCN REACTION THAT REQUIRED HOSPITALIZATION:  #  #  YES  #  #  Has patient had a PCN reaction occurring within the last 10 years: No  . Shellfish Allergy Anaphylaxis  . Latex Hives and Rash  . Banana Nausea Only  . Oxycodone     Pt stated, "Made me feel really drowsy" Tolerating as of 02/16/20 after neck surgery  . Asa [Aspirin] Nausea And Vomiting    Makes stomach upset Asprin 325  . Celebrex [Celecoxib] Nausea And Vomiting    GI upset  . Nsaids Rash    Current Outpatient Medications  Medication Sig Dispense Refill  . acetaminophen (TYLENOL) 500 MG tablet Take 1,000 mg by mouth as needed for moderate pain.     Marland Kitchen aspirin EC 81 MG tablet Take 81 mg by mouth daily.    Marland Kitchen diltiazem (DILACOR XR) 240 MG 24 hr capsule TAKE 1 CAPSULE(240 MG) BY MOUTH EVERY MORNING 90 capsule 1  . ferrous sulfate 325 (65 FE) MG tablet Take 325 mg by mouth once a week.     . furosemide (LASIX) 20 MG tablet Take 1 tablet (20 mg total) by mouth daily. 90 tablet 1  . Multiple Vitamin (MULTIVITAMIN) tablet Take 2 tablets by mouth  daily. Gummies    . rivaroxaban (XARELTO) 20 MG TABS tablet TAKE 1 TABLET(20 MG) BY MOUTH DAILY WITH SUPPER 90 tablet 3  . rosuvastatin (CRESTOR) 20 MG tablet TAKE 1 TABLET(20 MG) BY MOUTH DAILY 90 tablet 1   No current facility-administered medications for this visit.    Review of Systems  Constitutional:  Constitutional negative. HENT: HENT negative.  Eyes: Eyes negative.  Respiratory: Positive for shortness of breath.  Cardiovascular: Positive for leg swelling.  GI: Gastrointestinal negative.  Musculoskeletal: Musculoskeletal negative.  Skin: Skin negative.  Neurological: Positive for focal weakness.  Hematologic: Hematologic/lymphatic negative.  Psychiatric: Psychiatric negative.  Objective:  Objective   Vitals:   05/07/20 1016  BP: 124/79  Pulse: 67  Resp: 20  Temp: 97.8 F (36.6 C)  SpO2: 96%  Weight: 163 lb (73.9 kg)  Height: 5\' 4"  (1.626 m)   Body mass index is 27.98 kg/m.  Physical Exam HENT:     Head: Normocephalic.     Nose:     Comments: Wearing a mask    Mouth/Throat:     Mouth: Mucous membranes are moist.  Eyes:     Pupils: Pupils are equal, round, and reactive to light.  Cardiovascular:     Rate and Rhythm: Normal rate.     Pulses:          Carotid pulses are 2+ on the right side and 2+ on the left side.      Radial pulses are 2+ on the left side.       Femoral pulses are 2+ on the right side and 2+ on the left side. Abdominal:     General: Abdomen is flat.     Palpations: Abdomen is soft. There is no mass.  Musculoskeletal:        General: No swelling. Normal range of motion.  Skin:    General: Skin is warm.     Capillary Refill: Capillary refill takes less than 2 seconds.  Neurological:     General: No focal deficit present.     Mental Status: She is alert.  Psychiatric:        Mood and Affect: Mood normal.        Thought Content: Thought content normal.        Judgment: Judgment normal.     Data: CT angio  chest/abdomen/pelvis IMPRESSION:  1. Stable stent graft in the distal arch and descending thoracic  aorta, without endoleak, aneurysm, or patent false lumen.  2. Patent left carotid origin stent and carotid-subclavian bypass  graft.  3. Stable 3 cm suprarenal abdominal aortic aneurysm.  4. Stable 1.2 cm left internal iliac artery aneurysm.  5. Colonic diverticulosis.  6. Coronary calcifications.      Assessment/Plan:     69 year old female here for 1 year follow-up from above-noted procedures.  She will follow-up in 1 more year with repeat CT angio and also carotid duplex given presence of left common carotid stent.  Overall doing very well from vascular standpoint continues on aspirin.     Waynetta Sandy MD [facility]

## 2020-06-01 ENCOUNTER — Telehealth: Payer: Self-pay | Admitting: Podiatry

## 2020-06-01 NOTE — Telephone Encounter (Signed)
Pt came in the office today to discuss billing issue. She said she has a balance of 58 but it should only be 38 because she paid 20 thru the portal. Pt would like a call back please.

## 2020-06-03 ENCOUNTER — Ambulatory Visit: Payer: Medicare Other | Admitting: Pharmacist

## 2020-06-03 ENCOUNTER — Other Ambulatory Visit: Payer: Self-pay

## 2020-06-03 DIAGNOSIS — I10 Essential (primary) hypertension: Secondary | ICD-10-CM

## 2020-06-03 DIAGNOSIS — E785 Hyperlipidemia, unspecified: Secondary | ICD-10-CM

## 2020-06-03 DIAGNOSIS — I4891 Unspecified atrial fibrillation: Secondary | ICD-10-CM

## 2020-06-03 NOTE — Chronic Care Management (AMB) (Signed)
Chronic Care Management Pharmacy  Name: Samantha Stevenson  MRN: [id number] DOB: 06-19-1951  Chief Complaint/ HPI  [name],  69 y.o. , female presents for their Follow-Up CCM visit with the clinical pharmacist via telephone due to COVID-19 Pandemic.  PCP : Hoyt Koch, MD  Their chronic conditions include: Hypertension, Hyperlipidemia, Atrial Fibrillation, Coronary Artery Disease, Osteoarthritis and carotid artery disease, chronic neck/back pain  Office Visits: 07/17/19 Dr Sharlet Salina OV: c/o constipation, neck pain, Xarelto duration. Discussed need for Xarelto due to [facility] of 5, however can consult with cardiology. Rx'd magnesium citrtate and Miralax for constipation.  Consult Visit: 05/07/20 Dr Donzetta Matters (VVS): 1 year f/u transposition of clavian artery and carotid artery stenting for intramural hematoma.  05/04/20 Dr Curt Bears (cardiology): EP eval. Stopped digoxin, refilled Xarelto.   04/05/20 Dr Page (urology): f/u urethral caruncle, microscopic hematuria 03/23/20 Dr Royce Macadamia (nephrology): f/u HTN, microscopic hematuria, proteinuria 03/03/20 Dr Annette Stable (neurosurgery): f/u for spinal stenosis 02/02/20 Admission for spine surgery: uncomplicated procedure, pt recovering well.  11/18/19 PA Renee Ursuy/Camnitz (cardiology): [place] 5, h/o provoked DVT (post hip surgery), low burden d/t lack of sx. Cleared for surgery C-spine fusion. Hold Xarelto 3 days pre-op.   Seeing podiatry for bunion, s/p osteotomy, healing well.   09/30/19 Dr Page (urology) via claims.  08/26/19 Dr Loletha Carrow (GI): constipation, dysphagia,   Medications: Outpatient Encounter Medications as of 06/03/2020  Medication Sig  . acetaminophen (TYLENOL) 500 MG tablet Take 1,000 mg by mouth as needed for moderate pain.   Marland Kitchen aspirin EC 81 MG tablet Take 81 mg by mouth daily.  Marland Kitchen diltiazem (DILACOR XR) 240 MG 24 hr capsule TAKE 1 CAPSULE(240 MG) BY MOUTH EVERY MORNING  . ferrous sulfate 325 (65 FE) MG tablet Take 325 mg  by mouth once a week.   . furosemide (LASIX) 20 MG tablet Take 1 tablet (20 mg total) by mouth daily.  . Multiple Vitamin (MULTIVITAMIN) tablet Take 2 tablets by mouth daily. Gummies  . rivaroxaban (XARELTO) 20 MG TABS tablet TAKE 1 TABLET(20 MG) BY MOUTH DAILY WITH SUPPER  . rosuvastatin (CRESTOR) 20 MG tablet TAKE 1 TABLET(20 MG) BY MOUTH DAILY   No facility-administered encounter medications on file as of 06/03/2020.     Current Diagnosis/Assessment:    Goals Addressed            This Visit's Progress   . Pharmacy Care Plan       CARE PLAN ENTRY (see longitudinal plan of care for additional care plan information)  Current Barriers:  . Chronic Disease Management support, education, and care coordination needs related to Hypertension, Hyperlipidemia, Atrial Fibrillation, and Coronary Artery Disease   Hypertension / Atrial Fibrillation BP Readings from Last 3 Encounters:  05/07/20 124/79  05/04/20 (!) 144/78  04/12/20 120/70 .  Pharmacist Clinical Goal(s): o Over the next 180 days, patient will work with PharmD and providers to maintain BP goal <130/80 and prevent stroke . Current regimen:  o diltiazem 240 mg qAM,  o Xarelto 20 mg daily o furosemide 20 mg daily . Interventions: o Digoxin was discontinued due to low disease burden o Discussed bleeding risk with Xarelto o Discussed BP goals and benefits of medication for prevention of heart attack / stroke . Patient self care activities - Over the next 180 days, patient will: o Check BP as needed, document, and provide at future appointments o Ensure daily salt intake < 2300 mg/day o Monitor for signs/symptoms of bleeding; notify pharmacist or providers if bleeding occurs  Hyperlipidemia /  Coronary artery disease Lab Results  Component Value Date/Time   LDLCALC 117 (H) 01/03/2018 10:44 AM .  Pharmacist Clinical Goal(s): o Over the next 180 days, patient will work with PharmD and providers to achieve LDL goal <  70 . Current regimen:  o rosuvastatin 20 mg daily  o aspirin 81 mg daily . Interventions: o Discussed cholesterol goals and benefits of medication for prevention of heart attack / stroke . Patient self care activities - Over the next 180 days, patient will: o Continue medication as prescribed o Continue low cholesterol diet  Medication management . Pharmacist Clinical Goal(s): o Over the next 180 days, patient will work with PharmD and providers to maintain optimal medication adherence . Current [facility] . Interventions o Comprehensive medication review performed. o Continue current medication management strategy . Patient self care activities - Over the next 180 days, patient will: o Focus on medication adherence by pill box o Take medications as prescribed o Report any questions or concerns to PharmD and/or provider(s)  Please see past updates related to this goal by clicking on the "Past Updates" button in the selected goal        AFIB/Hypertension   Patient is currently rate controlled. Pulse Readings from Last 3 Encounters:  05/07/20 67  05/04/20 65  04/12/20 68   BP goal < 130/80  BP Readings from Last 3 Encounters:  05/07/20 124/79  05/04/20 (!) 144/78  04/12/20 120/70   Kidney Function Lab Results  Component Value Date/Time   CREATININE 0.90 04/30/2020 10:48 AM   CREATININE 0.76 01/29/2020 11:34 AM   GFR 76.05 05/16/2019 02:48 PM   GFRNONAA >60 01/29/2020 11:34 AM   GFRAA >60 01/29/2020 11:34 AM   K 4.1 01/29/2020 11:34 AM   K 3.8 10/30/2019 11:19 AM   Patient checks BP at home infrequently  Patient home BP readings are ranging: "normal"  Patient has failed these meds in past: digoxin Patient is currently controlled on the following medications:   diltiazem 240 mg qAM,   Xarelto 20 mg daily  furosemide 20 mg daily  We discussed:  Purpose of medications, role of digoxin in Afib. Pt has been taking for many years, cardiology reports low  burden from Afib, patient may be able to stop digoxin. Recommended to consult with cardiology at her f/u appt.  Discussed benefits of Xarelto, again patient can consult with cardiology regarding length of therapy however given CHADSVASC of 5 likely will require lifelong anticoagulation as long as benefits outweigh risks.  Plan  Continue current medications   Hyperlipidemia/CAD   LDL goal < 70 Hx MI (2000), carotid artery disease w/ stent  Lipid Panel     Component Value Date/Time   CHOL 189 01/03/2018 1044   TRIG 128.0 01/03/2018 1044   HDL 46.70 01/03/2018 1044   CHOLHDL 4 01/03/2018 1044   VLDL 25.6 01/03/2018 1044   LDLCALC 117 (H) 01/03/2018 1044    The ASCVD Risk score (Goff DC Jr., et al., 2013) failed to calculate for the following reasons:   The patient has a prior MI or stroke diagnosis   Patient has failed these meds in past: n/a Patient is currently controlled on the following medications:   rosuvastatin 20 mg daily,   aspirin 81 mg daily  We discussed:  diet and exercise extensively; pt reports diet with plenty of fish, chicken, [place]. Denies fried foods or red meat. Discussed cholesterol goal LDL < 100, it has been over 2 years since last lipid panel,  recommend to recheck panel at next appt to guide therapy decisions.  Plan  Continue current medications and control with diet and exercise  Recommend repeat lipid panel at next PCP appt   Anemia    CBC Latest Ref Rng & Units 01/29/2020 05/16/2019 10/09/2018  WBC 4.0 - 10.5 K/uL 5.8 6.5 7.3  Hemoglobin 12.0 - 15.0 g/dL 11.3(L) 10.4(L) 10.1(L)  Hematocrit 36 - 46 % 38.2 32.6(L) 33.8(L)  Platelets 150 - 400 K/uL 297 314.0 325    Patient has failed these meds in past: n/a Patient is currently controlled on the following medications:   ferrous sulfate 325 mg once weekly  We discussed:  Administration of iron with food to prevent GI side effects. Pt asked about gummies to improve palatability.    Plan  Continue current medications - may try gummies   Chronic pain   Patient has failed these meds in past: n/a Patient is currently controlled on the following medications:   baclofen 5 mg (1/2 of 10 mg) TID prn,   oxycodone-APAP 10-325 mg q6h prn,   Tylenol 500 mg prn  We discussed:  Had spinal surgery few weeks ago, recovering slowly. Still not driving.  Plan  Continue current medications   Medication Management   [facility] Uses pill box? Yes Pt endorses 100% compliance  We discussed: All medication have [facility] (preferred [facility]) and pt reports some small cost savings.   Plan  Continue current medication management strategy    Follow up: 6 month phone visit   Charlene Brooke, PharmD, [facility] Clinical Pharmacist [facility] [phone]

## 2020-06-03 NOTE — Patient Instructions (Addendum)
Visit Information  Phone number for Pharmacist: [phone]  Goals Addressed            This Visit's Progress   . Pharmacy Care Plan       CARE PLAN ENTRY (see longitudinal plan of care for additional care plan information)  Current Barriers:  . Chronic Disease Management support, education, and care coordination needs related to Hypertension, Hyperlipidemia, Atrial Fibrillation, and Coronary Artery Disease   Hypertension / Atrial Fibrillation BP Readings from Last 3 Encounters:  05/07/20 124/79  05/04/20 (!) 144/78  04/12/20 120/70 .  Pharmacist Clinical Goal(s): o Over the next 180 days, patient will work with PharmD and providers to maintain BP goal <130/80 and prevent stroke . Current regimen:  o diltiazem 240 mg qAM,  o Xarelto 20 mg daily o furosemide 20 mg daily . Interventions: o Digoxin was discontinued due to low disease burden o Discussed bleeding risk with Xarelto o Discussed BP goals and benefits of medication for prevention of heart attack / stroke . Patient self care activities - Over the next 180 days, patient will: o Check BP as needed, document, and provide at future appointments o Ensure daily salt intake < 2300 mg/day o Monitor for signs/symptoms of bleeding; notify pharmacist or providers if bleeding occurs  Hyperlipidemia / Coronary artery disease Lab Results  Component Value Date/Time   LDLCALC 117 (H) 01/03/2018 10:44 AM .  Pharmacist Clinical Goal(s): o Over the next 180 days, patient will work with PharmD and providers to achieve LDL goal < 70 . Current regimen:  o rosuvastatin 20 mg daily  o aspirin 81 mg daily . Interventions: o Discussed cholesterol goals and benefits of medication for prevention of heart attack / stroke . Patient self care activities - Over the next 180 days, patient will: o Continue medication as prescribed o Continue low cholesterol diet  Medication management . Pharmacist Clinical Goal(s): o Over the next 180  days, patient will work with PharmD and providers to maintain optimal medication adherence . Current pharmacy: [facility] . Interventions o Comprehensive medication review performed. o Continue current medication management strategy . Patient self care activities - Over the next 180 days, patient will: o Focus on medication adherence by pill box o Take medications as prescribed o Report any questions or concerns to PharmD and/or provider(s)  Please see past updates related to this goal by clicking on the "Past Updates" button in the selected goal       Patient verbalizes understanding of instructions provided today.   Telephone follow up appointment with pharmacy team member scheduled for: 6 months  Charlene Brooke, PharmD Clinical Pharmacist [facility] [place] Maintenance After Age 67 After age 67, you are at a higher risk for certain long-term diseases and infections as well as injuries from falls. Falls are a major cause of broken bones and head injuries in people who are older than age 67. Getting regular preventive care can help to keep you healthy and well. Preventive care includes getting regular testing and making lifestyle changes as recommended by your health care provider. Talk with your health care provider about:  Which screenings and tests you should have. A screening is a test that checks for a disease when you have no symptoms.  A diet and exercise plan that is right for you. What should I know about screenings and tests to prevent falls? Screening and testing are the best ways to find a health problem early. Early diagnosis  and treatment give you the best chance of managing medical conditions that are common after age 67. Certain conditions and lifestyle choices may make you more likely to have a fall. Your health care provider may recommend:  Regular vision checks. Poor vision and conditions such as cataracts can make you more  likely to have a fall. If you wear glasses, make sure to get your prescription updated if your vision changes.  Medicine review. Work with your health care provider to regularly review all of the medicines you are taking, including over-the-counter medicines. Ask your health care provider about any side effects that may make you more likely to have a fall. Tell your health care provider if any medicines that you take make you feel dizzy or sleepy.  Osteoporosis screening. Osteoporosis is a condition that causes the bones to get weaker. This can make the bones weak and cause them to break more easily.  Blood pressure screening. Blood pressure changes and medicines to control blood pressure can make you feel dizzy.  Strength and balance checks. Your health care provider may recommend certain tests to check your strength and balance while standing, walking, or changing positions.  Foot health exam. Foot pain and numbness, as well as not wearing proper footwear, can make you more likely to have a fall.  Depression screening. You may be more likely to have a fall if you have a fear of falling, feel emotionally low, or feel unable to do activities that you used to do.  Alcohol use screening. Using too much alcohol can affect your balance and may make you more likely to have a fall. What actions can I take to lower my risk of falls? General instructions  Talk with your health care provider about your risks for falling. Tell your health care provider if: ? You fall. Be sure to tell your health care provider about all falls, even ones that seem minor. ? You feel dizzy, sleepy, or off-balance.  Take over-the-counter and prescription medicines only as told by your health care provider. These include any supplements.  Eat a healthy diet and maintain a healthy weight. A healthy diet includes low-fat dairy products, low-fat (lean) meats, and fiber from whole grains, beans, and lots of fruits and  vegetables. Home safety  Remove any tripping hazards, such as rugs, cords, and clutter.  Install safety equipment such as grab bars in bathrooms and safety rails on stairs.  Keep rooms and walkways well-lit. Activity   Follow a regular exercise program to stay fit. This will help you maintain your balance. Ask your health care provider what types of exercise are appropriate for you.  If you need a cane or walker, use it as recommended by your health care provider.  Wear supportive shoes that have nonskid soles. Lifestyle  Do not drink alcohol if your health care provider tells you not to drink.  If you drink alcohol, limit how much you have: ? 0-1 drink a day for women. ? 0-2 drinks a day for men.  Be aware of how much alcohol is in your drink. In the [place] one drink equals one typical bottle of beer (12 oz), one-half glass of wine (5 oz), or one shot of hard liquor (1 oz).  Do not use any products that contain nicotine or tobacco, such as cigarettes and e-cigarettes. If you need help quitting, ask your health care provider. Summary  Having a healthy lifestyle and getting preventive care can help to protect your health and wellness  after age 34.  Screening and testing are the best way to find a health problem early and help you avoid having a fall. Early diagnosis and treatment give you the best chance for managing medical conditions that are more common for people who are older than age 88.  Falls are a major cause of broken bones and head injuries in people who are older than age 37. Take precautions to prevent a fall at home.  Work with your health care provider to learn what changes you can make to improve your health and wellness and to prevent falls. This information is not intended to replace advice given to you by your health care provider. Make sure you discuss any questions you have with your health care provider. Document Revised: 01/30/2019 Document Reviewed:  08/22/2017 Elsevier Patient Education  2020 [employer].

## 2020-06-09 DIAGNOSIS — M4802 Spinal stenosis, cervical region: Secondary | ICD-10-CM | POA: Diagnosis not present

## 2020-06-14 DIAGNOSIS — I129 Hypertensive chronic kidney disease with stage 1 through stage 4 chronic kidney disease, or unspecified chronic kidney disease: Secondary | ICD-10-CM | POA: Diagnosis not present

## 2020-06-14 DIAGNOSIS — R3129 Other microscopic hematuria: Secondary | ICD-10-CM | POA: Diagnosis not present

## 2020-06-22 NOTE — Progress Notes (Signed)
Reviewed chart and insurance data for adherence quality metrics. Patient does not have any gaps in adherence for Medicare start measures. No further action required.

## 2020-06-23 ENCOUNTER — Encounter: Payer: Self-pay | Admitting: Orthopaedic Surgery

## 2020-06-23 ENCOUNTER — Ambulatory Visit (INDEPENDENT_AMBULATORY_CARE_PROVIDER_SITE_OTHER): Payer: Medicare Other

## 2020-06-23 ENCOUNTER — Ambulatory Visit (INDEPENDENT_AMBULATORY_CARE_PROVIDER_SITE_OTHER): Payer: Medicare Other | Admitting: Orthopaedic Surgery

## 2020-06-23 VITALS — Ht 64.0 in | Wt 165.0 lb

## 2020-06-23 DIAGNOSIS — M25559 Pain in unspecified hip: Secondary | ICD-10-CM

## 2020-06-23 DIAGNOSIS — R809 Proteinuria, unspecified: Secondary | ICD-10-CM | POA: Diagnosis not present

## 2020-06-23 DIAGNOSIS — M25552 Pain in left hip: Secondary | ICD-10-CM | POA: Diagnosis not present

## 2020-06-23 DIAGNOSIS — Z96642 Presence of left artificial hip joint: Secondary | ICD-10-CM | POA: Diagnosis not present

## 2020-06-23 DIAGNOSIS — R82998 Other abnormal findings in urine: Secondary | ICD-10-CM | POA: Diagnosis not present

## 2020-06-23 DIAGNOSIS — I5032 Chronic diastolic (congestive) heart failure: Secondary | ICD-10-CM | POA: Diagnosis not present

## 2020-06-23 DIAGNOSIS — R3129 Other microscopic hematuria: Secondary | ICD-10-CM | POA: Diagnosis not present

## 2020-06-23 DIAGNOSIS — I10 Essential (primary) hypertension: Secondary | ICD-10-CM | POA: Diagnosis not present

## 2020-06-23 MED ORDER — METHYLPREDNISOLONE 4 MG PO TABS: ORAL_TABLET | ORAL | 0 refills | Status: DC

## 2020-06-23 MED ORDER — TRAMADOL HCL 50 MG PO TABS: 50.0000 mg | ORAL_TABLET | Freq: Four times a day (QID) | ORAL | 0 refills | Status: DC | PRN

## 2020-06-23 NOTE — Progress Notes (Signed)
Office Visit Note   Patient: Samantha Stevenson           Date of Birth: 09/13/1951           MRN: [id number] Visit Date: 06/23/2020              Requested by: Hoyt Koch, MD [address] [place],  [place] [address] PCP: Hoyt Koch, MD   Assessment & Plan: Visit Diagnoses:  1. Hip pain   2. Pain in left hip   3. History of total left hip arthroplasty     Plan: I am quite concerned about the severity of her left hip pain.  I feel a MRI is medically warranted of the left hip at this standpoint to assess for any loosening of the implant or other fluid collections or pathologic findings that can be the source of her left hip and groin pain.  She agrees with this treatment plan.  I will place put her on a 6-day steroid taper to calm down some of the inflammation.  We will see her back in follow-up after this MRI is obtained.  All question concerns were answered and addressed.  Follow-Up Instructions: Return in about 3 years (around 06/24/2023).   Orders:  Orders Placed This Encounter  Procedures  . XR HIP UNILAT W OR W/O PELVIS 1V LEFT   Meds ordered this encounter  Medications  . methylPREDNISolone (MEDROL) 4 MG tablet    Sig: Medrol dose pack. Take as instructed    Dispense:  21 tablet    Refill:  0  . traMADol (ULTRAM) 50 MG tablet    Sig: Take 1-2 tablets (50-100 mg total) by mouth every 6 (six) hours as needed.    Dispense:  30 tablet    Refill:  0      Procedures: No procedures performed   Clinical Data: No additional findings.   Subjective: Chief Complaint  Patient presents with  . Left Hip - Pain  The patient is well-known to me but I have not seen her in many years that she is listed as a new patient.  We replaced her left hip in 2014.  She has been having worsening groin pain for several years now that she says wakes her up at night.  She has had a hard time picking up her left leg at times.  She is on Xarelto and has had significant  cardiac issues from what I understand.  She cannot take anti-inflammatories due to being on blood thinning medication.  She would like [place] to look at her left hip today and I agree with this based on what she is describing in terms of her signs and symptoms.  HPI  Review of Systems Today she denies any headache, chest pain, shortness of breath, fever, chills, nausea, vomiting  Objective: Vital Signs: Ht 5\' 4"  (1.626 m)   Wt 165 lb (74.8 kg)   BMI 28.32 kg/m   Physical Exam She is alert and oriented x3 and in no acute distress Ortho Exam Examination of her left hip shows that it moves smoothly and fluidly but she has pain throughout the arc of rotation of her left hip and it seems to be in the groin. Specialty Comments:  No specialty comments available.  Imaging: XR HIP UNILAT W OR W/O PELVIS 1V LEFT  Result Date: 06/23/2020 An AP pelvis and lateral left hip shows that the implant itself is well seated.  She does have a decrease in offset  compared to films from many years ago but those films were done in a supine position and the today's films are done standing.  I did not see any gross evidence of loosening of the components.    PMFS History: Patient Active Problem List   Diagnosis Date Noted  . Cervical spondylosis with myelopathy and radiculopathy 02/02/2020  . Chronic neck pain 07/17/2019  . LLQ pain 05/16/2019  . Gross hematuria 05/16/2019  . Chronic bilateral low back pain without sciatica 10/07/2018  . Pain and swelling of left lower leg 08/08/2018  . Left arm swelling 08/08/2018  . Hematuria, gross 07/29/2018  . Thoracic aortic dissection ([place]) 06/13/2018  . Intramural aortic hematoma ([place]) 05/28/2018  . Dysfunction of left eustachian tube 04/05/2018  . Preoperative clearance 02/27/2018  . Bilateral leg edema 02/27/2018  . Dyspnea 06/19/2016  . Routine general medical examination at a health care facility 06/24/2015  . Constipation 03/17/2015  . Myalgia and myositis  03/17/2015  . Dyslipidemia   . Atrial fibrillation ([place])   . Hypertension   . History of pulmonary embolism 01/10/2013  . Anemia 01/10/2013  . Arthritis 01/03/2013   Past Medical History:  Diagnosis Date  . Allergy   . Anemia   . Arthritis    "qwhere" (07/29/2018)  . Atrial fibrillation ([place])   . CHF (congestive heart failure) ([place])   . Clotting disorder ([place])   . Degenerative arthritis of hip    s/p L THR 12/2012  . Dyspnea    since surgery in August 2019- "when I get worked up and walk a short distance"  . Dysrhythmia   . History of blood transfusion 05/2018   "related to OR"  . Hyperlipidemia   . Hypertension   . Intramural hematoma of thoracic aorta ([place]) 06/13/2018  . Migraines    mIgraines- none since blood pressure and lipids are under control  . Myocardial infarction ([place]) 2000   due to atrial fib  . Neuromuscular disorder ([place])    pinched nerve- left side of neck  . Pulmonary emboli ([place]) 12/2012 dx   post op (L THR), anticoag x 42mo    Family History  Problem Relation Age of Onset  . Heart disease Father   . Diabetes Mother   . Heart disease Mother        pacemaker  . Heart attack Mother 83  . Diabetes Sister   . Alcohol abuse Other   . Heart disease Other   . Hyperlipidemia Other   . Hypertension Other   . Diabetes Other   . Breast cancer Other   . Colon cancer Neg Hx   . Esophageal cancer Neg Hx   . Stomach cancer Neg Hx   . Rectal cancer Neg Hx     Past Surgical History:  Procedure Laterality Date  . ABDOMINAL HYSTERECTOMY     "w/1 tube"  . ANTERIOR CERVICAL DECOMPRESSION/DISCECTOMY FUSION 4 LEVELS N/A 02/02/2020   Procedure: CERVICAL THREE-FOUR, CERVICAL FOUR-FIVE, CERVICAL FIVE-SIX, CERVICAL SIX-SEVEN ANTERIOR CERVICAL DECOMPRESSION/DISCECTOMY FUSION;  Surgeon: Earnie Larsson, MD;  Location: [facility];  Service: Neurosurgery;  Laterality: N/A;  . ARTERY REPAIR Left 08/15/2018   Procedure: LEFT BRACHIAL ARTERY EXPLORATION;  Surgeon: Waynetta Sandy, MD;  Location: [facility];  Service: Vascular;  Laterality: Left;  . Breast Ultrasound Left 04/16/13   Done @ breast center Impression: no malignancy appearance noted on the screen study is consistent with a summation shadow  . CARDIAC CATHETERIZATION    . CAROTID-SUBCLAVIAN BYPASS GRAFT Left  06/13/2018   Procedure: BYPASS GRAFT CAROTID-SUBCLAVIAN;  Surgeon: Waynetta Sandy, MD;  Location: [facility];  Service: Vascular;  Laterality: Left;  . COLONOSCOPY    . DG TUMB RIGHT HAND Right    cyst removal  . IR THORACENTESIS ASP PLEURAL SPACE W/IMG GUIDE  05/31/2018  . JOINT REPLACEMENT    . THORACIC AORTIC ENDOVASCULAR STENT GRAFT Left 06/13/2018   Procedure: LEFT  SUBCLAVIAN TO CAROTID ARTERY TRANSPOSITION; THORACIC AORTIC ENDOVASCULAR STENT GRAFT using GORE CONFORMABLE THORACIC STENT GRAFT AND GORE TAG THORACIC ENDOPROSTHESIS; STENT LEFT COMMON CAROTID ARTERY, RIGHT COMMON-FEMORAL ENDARTERECTOMY WITH PATCH ANGIOPLASTY;  Surgeon: Waynetta Sandy, MD;  Location: [facility];  Service: Vascular;  Laterality: Left;  . TONSILLECTOMY    . TOTAL HIP ARTHROPLASTY Left 01/03/2013   Procedure: TOTAL HIP ARTHROPLASTY ANTERIOR APPROACH;  Surgeon: Mcarthur Rossetti, MD;  Location: [facility];  Service: Orthopedics;  Laterality: Left;  Left Total Hip Arthroplasty, Anterior Approach  . TUBAL LIGATION    . VAGINA RECONSTRUCTION SURGERY  1966   vagina had closed up when 69 years old  . WOUND EXPLORATION Left 06/16/2018   Procedure: LEFT NECK WOUND EXPLORATION, CHEST TUBE INSERTION;  Surgeon: Waynetta Sandy, MD;  Location: [facility];  Service: Vascular;  Laterality: Left;   Social History   Occupational History  . Occupation: retired  Tobacco Use  . Smoking status: Never Smoker  . Smokeless tobacco: Never Used  Vaping Use  . Vaping Use: Never used  Substance and Sexual Activity  . Alcohol use: Not Currently  . Drug use: Never  . Sexual activity: Not Currently

## 2020-06-24 ENCOUNTER — Other Ambulatory Visit: Payer: Self-pay

## 2020-06-24 DIAGNOSIS — Z96642 Presence of left artificial hip joint: Secondary | ICD-10-CM

## 2020-06-24 DIAGNOSIS — M25559 Pain in unspecified hip: Secondary | ICD-10-CM

## 2020-07-02 DIAGNOSIS — N8189 Other female genital prolapse: Secondary | ICD-10-CM | POA: Diagnosis not present

## 2020-07-03 DIAGNOSIS — Z23 Encounter for immunization: Secondary | ICD-10-CM | POA: Diagnosis not present

## 2020-07-14 ENCOUNTER — Ambulatory Visit: Payer: Medicare Other | Admitting: Orthopaedic Surgery

## 2020-07-20 ENCOUNTER — Other Ambulatory Visit: Payer: Self-pay

## 2020-07-20 ENCOUNTER — Ambulatory Visit
Admission: RE | Admit: 2020-07-20 | Discharge: 2020-07-20 | Disposition: A | Discharge status: Home / Self Care | Payer: Medicare Other | Source: Ambulatory Visit | Attending: Orthopaedic Surgery | Admitting: Orthopaedic Surgery

## 2020-07-20 DIAGNOSIS — Z96642 Presence of left artificial hip joint: Secondary | ICD-10-CM | POA: Diagnosis not present

## 2020-07-20 DIAGNOSIS — M25552 Pain in left hip: Secondary | ICD-10-CM | POA: Diagnosis not present

## 2020-07-22 ENCOUNTER — Ambulatory Visit (INDEPENDENT_AMBULATORY_CARE_PROVIDER_SITE_OTHER): Payer: Medicare Other

## 2020-07-22 ENCOUNTER — Encounter: Payer: Self-pay | Admitting: Orthopaedic Surgery

## 2020-07-22 ENCOUNTER — Ambulatory Visit (INDEPENDENT_AMBULATORY_CARE_PROVIDER_SITE_OTHER): Payer: Medicare Other | Admitting: Orthopaedic Surgery

## 2020-07-22 ENCOUNTER — Other Ambulatory Visit: Payer: Self-pay

## 2020-07-22 DIAGNOSIS — Z23 Encounter for immunization: Secondary | ICD-10-CM

## 2020-07-22 DIAGNOSIS — M5442 Lumbago with sciatica, left side: Secondary | ICD-10-CM | POA: Diagnosis not present

## 2020-07-22 MED ORDER — ACETAMINOPHEN-CODEINE #3 300-30 MG PO TABS: 1.0000 | ORAL_TABLET | Freq: Three times a day (TID) | ORAL | 0 refills | Status: DC | PRN

## 2020-07-22 MED ORDER — GABAPENTIN 300 MG PO CAPS: 300.0000 mg | ORAL_CAPSULE | Freq: Every day | ORAL | 1 refills | Status: DC

## 2020-07-22 NOTE — Progress Notes (Signed)
The patient comes in today with continued worsening left hip pain.  We replaced her left hip in 2014 and she had no significant problems with that until significant acute pain recently.  The plain films of her left hip were normal so I sent her for an MRI.  She now reports pain in the sciatic region and in the groin on the left side that radiates past the knee down into her foot and she reports numbness and tingling that she did not before.  On exam she has significant pain when perform a straight leg raise on the left side.  She has numbness in her left foot.  She does not have any strength deficits thus far but does hurt with rotation of the left hip.  MRI of her left hip is entirely normal.  There is no fluid collection around the prosthesis or worrisome features of the bone or muscle.  There is no evidence of loosening of the prosthesis.  Given the severity of her pain and now having difficulty getting sleep at night, I would like to put her on 300 mg of Neurontin at bedtime and try some Tylenol with codeine (Tylenol with 3) for her pain.  At this point I do feel it is medically necessary we obtain an MRI of her lumbar spine to rule out herniated disc that is causing the radicular symptoms she is having in her left lower extremity.

## 2020-07-23 ENCOUNTER — Other Ambulatory Visit: Payer: Self-pay

## 2020-07-23 DIAGNOSIS — M5442 Lumbago with sciatica, left side: Secondary | ICD-10-CM

## 2020-07-31 ENCOUNTER — Other Ambulatory Visit: Payer: Self-pay | Admitting: Physician Assistant

## 2020-07-31 ENCOUNTER — Other Ambulatory Visit: Payer: Self-pay | Admitting: Internal Medicine

## 2020-08-03 ENCOUNTER — Ambulatory Visit: Payer: Medicare Other | Admitting: Orthopaedic Surgery

## 2020-08-03 ENCOUNTER — Telehealth: Payer: Self-pay | Admitting: Physician Assistant

## 2020-08-03 DIAGNOSIS — R310 Gross hematuria: Secondary | ICD-10-CM | POA: Diagnosis not present

## 2020-08-03 NOTE — Telephone Encounter (Signed)
Pt made aware [facility] will need to send clearance request to our office. Pt thanked me for the call.

## 2020-08-03 NOTE — Telephone Encounter (Signed)
Pt c/o medication issue:  1. Name of Medication: rivaroxaban (XARELTO) 20 MG TABS tablet  2. How are you currently taking this medication (dosage and times per day)? 20 mg once daily   3. Are you having a reaction (difficulty breathing--STAT)? no  4. What is your medication issue? Urologist will need permission for patient to stop medication for 4-5 days to do a procedure. Advised patient to have Urologist (Dr. Arnette Schaumann, [facility]) contact us

## 2020-08-05 ENCOUNTER — Telehealth: Payer: Self-pay | Admitting: Cardiology

## 2020-08-05 NOTE — Telephone Encounter (Signed)
Will confirm with Dr. Curt Bears if he has a preference how many days ok to hold Xarelto.

## 2020-08-05 NOTE — Telephone Encounter (Signed)
See phone note from today 08/05/20. Per Dr. Curt Bears he has cleared ok for the pt to hold her Xarelto 4-5 days per request from Dr. Jacalyn Lefevre.

## 2020-08-05 NOTE — Telephone Encounter (Signed)
Message sent to Dr. Curt Bears for recommendations in regards to Xarelto. Dr. Curt Bears prescribed Xarelto.

## 2020-08-05 NOTE — Telephone Encounter (Signed)
I s/w [facility] surgery scheduler for Dr. Claudia Desanctis. I informed the surgery scheduler the pt called our office in regards to needing clearance for upcoming procedure. Per surgery scheduler there is nothing down yet for the pt to have any procedure or surgery. She read notes to me that the pt will need a repeat cystoscopy at her f/u appt with Dr. Claudia Desanctis, though this has not yet been scheduled per surgery scheduler. Surgery scheduler did tell me that once the procedure and appt have been scheduled they will send over a clearance request in regards to medications that may need to be held for procedure. Surgery scheduler thanked me for the call and the update.

## 2020-08-05 NOTE — Telephone Encounter (Signed)
Ok to hold for 4-5 days per urology recommendations.

## 2020-08-05 NOTE — Telephone Encounter (Signed)
Should be ok to hold xarelto.

## 2020-08-05 NOTE — Telephone Encounter (Signed)
I returned call to Mickel Baas, surgery scheduler for Dr. Claudia Desanctis. Mickel Baas, clarified there was some confusion. No surgery or procedure to be done. The reason for the request to hold Xarelto 4-5 days due to pt has blood in her urine. Mickel Baas stated Dr. Claudia Desanctis is asking cardiology if ok to advise pt to hold blood thinner for 4-5 days due to blood in urine. I assured Mickel Baas, I will send note to MD/Pre Op for further advice and we will call back once advised. Mickel Baas thanked me for the help.

## 2020-08-05 NOTE — Telephone Encounter (Signed)
I s/w the pt as well and informed her of my call today with [facility]. Assured the pt they will fax over a clearance form once Dr. Claudia Desanctis is ready for her surgery/procedure. Pt thanked me for the call and the update.

## 2020-08-05 NOTE — Telephone Encounter (Signed)
Thank you Dr. Curt Bears for your help. I will send notes to Dr. Claudia Desanctis as well as I will call Mickel Baas and update. I s/w Cindy at [facility] and went over recommendations ok per Dr. Curt Bears to hold Xarelto 4-5 days as per Dr. Jacalyn Lefevre is request. Jenny Reichmann verbalized understanding to recommendations. I will fax these notes to Dr. Claudia Desanctis as well.

## 2020-08-05 NOTE — Telephone Encounter (Signed)
Please call Mickel Baas regarding the patient at [phone] option 6

## 2020-08-13 ENCOUNTER — Ambulatory Visit (INDEPENDENT_AMBULATORY_CARE_PROVIDER_SITE_OTHER): Payer: Medicare Other | Admitting: Internal Medicine

## 2020-08-13 ENCOUNTER — Encounter: Payer: Self-pay | Admitting: Internal Medicine

## 2020-08-13 ENCOUNTER — Encounter: Payer: Self-pay | Admitting: Podiatry

## 2020-08-13 ENCOUNTER — Other Ambulatory Visit: Payer: Self-pay

## 2020-08-13 VITALS — BP 120/70 | HR 74 | Temp 97.9°F | Ht 64.0 in | Wt 165.0 lb

## 2020-08-13 DIAGNOSIS — I10 Essential (primary) hypertension: Secondary | ICD-10-CM

## 2020-08-13 DIAGNOSIS — E785 Hyperlipidemia, unspecified: Secondary | ICD-10-CM

## 2020-08-13 DIAGNOSIS — D649 Anemia, unspecified: Secondary | ICD-10-CM | POA: Diagnosis not present

## 2020-08-13 MED ORDER — DILTIAZEM HCL ER 240 MG PO CP24
ORAL_CAPSULE | ORAL | 1 refills | Status: DC
Start: 2020-08-13 — End: 2021-02-10

## 2020-08-13 MED ORDER — ROSUVASTATIN CALCIUM 20 MG PO TABS
ORAL_TABLET | ORAL | 1 refills | Status: DC
Start: 2020-08-13 — End: 2021-04-01

## 2020-08-13 NOTE — Progress Notes (Signed)
Subjective:    Patient ID: Samantha Stevenson, female    DOB: [birth year], 69 y.o.   MRN: [id number]  HPI  Here to f/u; overall doing ok,  Pt denies chest pain, increasing sob or doe, wheezing, orthopnea, PND, increased LE swelling, palpitations, dizziness or syncope.  Pt denies new neurological symptoms such as new headache, or facial or extremity weakness or numbness.  Pt denies polydipsia, polyuria, or low sugar episode.  Pt states overall good compliance with meds, mostly trying to follow appropriate diet, with wt overall stable,  but little exercise however. No overt bleeding, Here as PCP is out of office today Past Medical History:  Diagnosis Date  . Allergy   . Anemia   . Arthritis    "qwhere" (07/29/2018)  . Atrial fibrillation ([place])   . CHF (congestive heart failure) ([place])   . Clotting disorder ([place])   . Degenerative arthritis of hip    s/p L THR 12/2012  . Dyspnea    since surgery in August 2019- "when I get worked up and walk a short distance"  . Dysrhythmia   . History of blood transfusion 05/2018   "related to OR"  . Hyperlipidemia   . Hypertension   . Intramural hematoma of thoracic aorta ([place]) 06/13/2018  . Migraines    mIgraines- none since blood pressure and lipids are under control  . Myocardial infarction ([place]) 2000   due to atrial fib  . Neuromuscular disorder ([place])    pinched nerve- left side of neck  . Pulmonary emboli ([place]) 12/2012 dx   post op (L THR), anticoag x 39mo   Past Surgical History:  Procedure Laterality Date  . ABDOMINAL HYSTERECTOMY     "w/1 tube"  . ANTERIOR CERVICAL DECOMPRESSION/DISCECTOMY FUSION 4 LEVELS N/A 02/02/2020   Procedure: CERVICAL THREE-FOUR, CERVICAL FOUR-FIVE, CERVICAL FIVE-SIX, CERVICAL SIX-SEVEN ANTERIOR CERVICAL DECOMPRESSION/DISCECTOMY FUSION;  Surgeon: Earnie Larsson, MD;  Location: [facility];  Service: Neurosurgery;  Laterality: N/A;  . ARTERY REPAIR Left 08/15/2018   Procedure: LEFT BRACHIAL ARTERY EXPLORATION;  Surgeon: Waynetta Sandy, MD;  Location: [facility];  Service: Vascular;  Laterality: Left;  . Breast Ultrasound Left 04/16/13   Done @ breast center Impression: no malignancy appearance noted on the screen study is consistent with a summation shadow  . CARDIAC CATHETERIZATION    . CAROTID-SUBCLAVIAN BYPASS GRAFT Left 06/13/2018   Procedure: BYPASS GRAFT CAROTID-SUBCLAVIAN;  Surgeon: Waynetta Sandy, MD;  Location: [facility];  Service: Vascular;  Laterality: Left;  . COLONOSCOPY    . DG TUMB RIGHT HAND Right    cyst removal  . IR THORACENTESIS ASP PLEURAL SPACE W/IMG GUIDE  05/31/2018  . JOINT REPLACEMENT    . THORACIC AORTIC ENDOVASCULAR STENT GRAFT Left 06/13/2018   Procedure: LEFT  SUBCLAVIAN TO CAROTID ARTERY TRANSPOSITION; THORACIC AORTIC ENDOVASCULAR STENT GRAFT using GORE CONFORMABLE THORACIC STENT GRAFT AND GORE TAG THORACIC ENDOPROSTHESIS; STENT LEFT COMMON CAROTID ARTERY, RIGHT COMMON-FEMORAL ENDARTERECTOMY WITH PATCH ANGIOPLASTY;  Surgeon: Waynetta Sandy, MD;  Location: [facility];  Service: Vascular;  Laterality: Left;  . TONSILLECTOMY    . TOTAL HIP ARTHROPLASTY Left 01/03/2013   Procedure: TOTAL HIP ARTHROPLASTY ANTERIOR APPROACH;  Surgeon: Mcarthur Rossetti, MD;  Location: [facility];  Service: Orthopedics;  Laterality: Left;  Left Total Hip Arthroplasty, Anterior Approach  . TUBAL LIGATION    . VAGINA RECONSTRUCTION SURGERY  1966   vagina had closed up when 69 years old  . WOUND EXPLORATION Left 06/16/2018   Procedure: LEFT NECK  WOUND EXPLORATION, CHEST TUBE INSERTION;  Surgeon: Waynetta Sandy, MD;  Location: [facility];  Service: Vascular;  Laterality: Left;    reports that she has never smoked. She has never used smokeless tobacco. She reports previous alcohol use. She reports that she does not use drugs. family history includes Alcohol abuse in an other family member; Breast cancer in an other family member; Diabetes in her mother, sister, and another family member; Heart  attack (age of onset: 62) in her mother; Heart disease in her father, mother, and another family member; Hyperlipidemia in an other family member; Hypertension in an other family member. Allergies  Allergen Reactions  . Penicillins Swelling and Other (See Comments)    Severe swelling PATIENT HAS HAD A PCN REACTION WITH IMMEDIATE RASH, FACIAL/TONGUE/THROAT SWELLING, SOB, OR LIGHTHEADEDNESS WITH HYPOTENSION:  #  #  YES  #  #  Has patient had a PCN reaction causing severe rash involving mucus membranes or skin necrosis: No PATIENT HAS HAD A PCN REACTION THAT REQUIRED HOSPITALIZATION:  #  #  YES  #  #  Has patient had a PCN reaction occurring within the last 10 years: No  . Shellfish Allergy Anaphylaxis  . Latex Hives and Rash  . Banana Nausea Only  . Oxycodone     Pt stated, "Made me feel really drowsy" Tolerating as of 02/16/20 after neck surgery  . Asa [Aspirin] Nausea And Vomiting    Makes stomach upset Asprin 325  . Celebrex [Celecoxib] Nausea And Vomiting    GI upset  . Nsaids Rash   Current Outpatient Medications on File Prior to Visit  Medication Sig Dispense Refill  . acetaminophen (TYLENOL) 500 MG tablet Take 1,000 mg by mouth as needed for moderate pain.     Marland Kitchen acetaminophen-codeine (TYLENOL #3) 300-30 MG tablet Take 1-2 tablets by mouth every 8 (eight) hours as needed for moderate pain. 30 tablet 0  . aspirin EC 81 MG tablet Take 81 mg by mouth daily.    . ferrous sulfate 325 (65 FE) MG tablet Take 325 mg by mouth once a week.     . furosemide (LASIX) 20 MG tablet TAKE 1 TABLET BY MOUTH EVERY DAY 90 tablet 1  . gabapentin (NEURONTIN) 300 MG capsule Take 1 capsule (300 mg total) by mouth at bedtime. 30 capsule 1  . methylPREDNISolone (MEDROL) 4 MG tablet Medrol dose pack. Take as instructed 21 tablet 0  . Multiple Vitamin (MULTIVITAMIN) tablet Take 2 tablets by mouth daily. Gummies    . rivaroxaban (XARELTO) 20 MG TABS tablet TAKE 1 TABLET(20 MG) BY MOUTH DAILY WITH SUPPER 90  tablet 3   No current facility-administered medications on file prior to visit.   Review of Systems All otherwise neg per pt     Objective:   Physical Exam BP 120/70 (BP Location: Left Arm, Patient Position: Sitting, Cuff Size: Large)   Pulse 74   Temp 97.9 F (36.6 C) (Oral)   Ht 5\' 4"  (1.626 m)   Wt 165 lb (74.8 kg)   SpO2 96%   BMI 28.32 kg/m  VS noted,  Constitutional: Pt appears in NAD HENT: Head: NCAT.  Right Ear: External ear normal.  Left Ear: External ear normal.  Eyes: . Pupils are equal, round, and reactive to light. Conjunctivae and EOM are normal Nose: without d/c or deformity Neck: Neck supple. Gross normal ROM Cardiovascular: Normal rate and regular rhythm.   Pulmonary/Chest: Effort normal and breath sounds without rales or wheezing.  Abd:  Soft, NT, ND, + BS, no organomegaly Neurological: Pt is alert. At baseline orientation, motor grossly intact Skin: Skin is warm. No rashes, other new lesions, no LE edema Psychiatric: Pt behavior is normal without agitation  All otherwise neg per pt Lab Results  Component Value Date   WBC 5.8 01/29/2020   HGB 11.3 (L) 01/29/2020   HCT 38.2 01/29/2020   PLT 297 01/29/2020   GLUCOSE 91 01/29/2020   CHOL 189 01/03/2018   TRIG 128.0 01/03/2018   HDL 46.70 01/03/2018   LDLCALC 117 (H) 01/03/2018   ALT 13 05/16/2019   AST 16 05/16/2019   NA 139 01/29/2020   K 4.1 01/29/2020   CL 104 01/29/2020   CREATININE 0.90 04/30/2020   BUN 11 01/29/2020   CO2 24 01/29/2020   TSH 2.24 08/27/2013   INR 1.2 01/29/2020   HGBA1C 6.2 06/19/2016          Assessment & Plan:

## 2020-08-13 NOTE — Patient Instructions (Signed)
Please continue all other medications as before, and refills have been done if requested.  Please have the pharmacy call with any other refills you may need.  Please continue your efforts at being more active, low cholesterol diet, and weight control.  Please keep your appointments with your specialists as you may have planned  Please make an Appointment to return in 6 months, or sooner if needed, with Dr Sharlet Salina

## 2020-08-14 ENCOUNTER — Ambulatory Visit
Admission: RE | Admit: 2020-08-14 | Discharge: 2020-08-14 | Disposition: A | Discharge status: Home / Self Care | Payer: Medicare Other | Source: Ambulatory Visit | Attending: Orthopaedic Surgery | Admitting: Orthopaedic Surgery

## 2020-08-14 ENCOUNTER — Encounter: Payer: Self-pay | Admitting: Internal Medicine

## 2020-08-14 DIAGNOSIS — M5442 Lumbago with sciatica, left side: Secondary | ICD-10-CM

## 2020-08-14 DIAGNOSIS — M545 Low back pain, unspecified: Secondary | ICD-10-CM | POA: Diagnosis not present

## 2020-08-14 NOTE — Assessment & Plan Note (Addendum)
stable overall by history and exam, recent data reviewed with pt, and pt to continue medical treatment as before - crestor 20 qd,  to f/u any worsening symptoms or concerns, for lipid with next labs

## 2020-08-14 NOTE — Assessment & Plan Note (Signed)
stable overall by history and exam, recent data reviewed with pt, and pt to continue medical treatment as before,  to f/u any worsening symptoms or concerns BP Readings from Last 3 Encounters:  08/13/20 120/70  05/07/20 124/79  05/04/20 (!) 144/78

## 2020-08-14 NOTE — Assessment & Plan Note (Signed)
stable overall by history and exam, recent data reviewed with pt, and pt to continue medical treatment as before,  to f/u any worsening symptoms or concerns, for f/u lab 

## 2020-08-16 ENCOUNTER — Ambulatory Visit: Payer: Medicare Other | Admitting: Orthopaedic Surgery

## 2020-08-17 ENCOUNTER — Ambulatory Visit (INDEPENDENT_AMBULATORY_CARE_PROVIDER_SITE_OTHER): Payer: Medicare Other | Admitting: Orthopaedic Surgery

## 2020-08-17 ENCOUNTER — Encounter: Payer: Self-pay | Admitting: Orthopaedic Surgery

## 2020-08-17 DIAGNOSIS — M5442 Lumbago with sciatica, left side: Secondary | ICD-10-CM

## 2020-08-17 NOTE — Progress Notes (Signed)
Patient comes in today to go over a MRI of her lumbar spine.  She has been having radicular symptoms from her sciatic region going all the way down into her shin and her foot.  I replaced her left hip in 2015.  She was also having some hip and groin pain.  X-rays of her pelvis and left hip show well-seated prosthesis.  We have an MRI of the left hip and it showed no evidence of fluid collection or prosthetic loosening.  Given her continued radicular complaints we felt a MRI was warranted of lumbar spine.  MRI is reviewed with her and it does show some moderate foraminal narrowing to the left side at L3-L4 due to combination of factors including facet arthritis.  She did let me know that she recently injured her right dominant hand.  She was doing something with her fingers and hands at a ball game with no significant trauma and she developed acute right hand pain and she points to the dorsal aspect of her hand a source of her pain.  On examination of her right hand there is significant dorsal soft tissue swelling.  She can extend and flex her fingers but I feel a little bit of clicking in the hand near the index MCP joint.  This may be representative of a sagittal band injury.  I would like to send her to Dr. Ernestina Patches for a left-sided epidural steroid injection at L3-L4.  I would also like to send her to Dr. Charlotte Crumb of the hand center in [place] to evaluate and treat a right dominant hand index finger sagittal band injury, which is my working diagnosis.  She agrees with the referrals.  We will contact her when we can make this happen.

## 2020-08-18 ENCOUNTER — Other Ambulatory Visit: Payer: Self-pay

## 2020-08-18 DIAGNOSIS — M5442 Lumbago with sciatica, left side: Secondary | ICD-10-CM

## 2020-08-24 DIAGNOSIS — M79641 Pain in right hand: Secondary | ICD-10-CM | POA: Diagnosis not present

## 2020-09-03 ENCOUNTER — Telehealth: Payer: Self-pay | Admitting: Pharmacist

## 2020-09-03 NOTE — Progress Notes (Signed)
Chronic Care Management Pharmacy Assistant   Name: Samantha Stevenson  MRN: [id number] DOB: 08/25/1951  Reason for Encounter: General Adherence Call  Patient Questions:  1.  Have you seen any other providers since your last visit? Patient last seen Dr [facility] on 06/23/20 and 07/22/20  2.  Any changes in your medicines or health? Medications that was given to patient by Dr. Harrell Gave were methylprednisolone 4 mg and tramadol 50-100 mg on 06/23/20 and acetaminophen-codeine 300-30 mg and Gabapentin 300 mg    PCP : Hoyt Koch, MD  Allergies:   Allergies  Allergen Reactions  . Penicillins Swelling and Other (See Comments)    Severe swelling PATIENT HAS HAD A PCN REACTION WITH IMMEDIATE RASH, FACIAL/TONGUE/THROAT SWELLING, SOB, OR LIGHTHEADEDNESS WITH HYPOTENSION:  #  #  YES  #  #  Has patient had a PCN reaction causing severe rash involving mucus membranes or skin necrosis: No PATIENT HAS HAD A PCN REACTION THAT REQUIRED HOSPITALIZATION:  #  #  YES  #  #  Has patient had a PCN reaction occurring within the last 10 years: No  . Shellfish Allergy Anaphylaxis  . Latex Hives and Rash  . Banana Nausea Only  . Oxycodone     Pt stated, "Made me feel really drowsy" Tolerating as of 02/16/20 after neck surgery  . Asa [Aspirin] Nausea And Vomiting    Makes stomach upset Asprin 325  . Celebrex [Celecoxib] Nausea And Vomiting    GI upset  . Nsaids Rash    Medications: Outpatient Encounter Medications as of 09/03/2020  Medication Sig  . acetaminophen (TYLENOL) 500 MG tablet Take 1,000 mg by mouth as needed for moderate pain.   Marland Kitchen acetaminophen-codeine (TYLENOL #3) 300-30 MG tablet Take 1-2 tablets by mouth every 8 (eight) hours as needed for moderate pain.  Marland Kitchen aspirin EC 81 MG tablet Take 81 mg by mouth daily.  Marland Kitchen diltiazem (DILACOR XR) 240 MG 24 hr capsule TAKE 1 CAPSULE(240 MG) BY MOUTH EVERY MORNING  . ferrous sulfate 325 (65 FE) MG tablet Take 325 mg by  mouth once a week.   . furosemide (LASIX) 20 MG tablet TAKE 1 TABLET BY MOUTH EVERY DAY  . gabapentin (NEURONTIN) 300 MG capsule Take 1 capsule (300 mg total) by mouth at bedtime.  . methylPREDNISolone (MEDROL) 4 MG tablet Medrol dose pack. Take as instructed  . Multiple Vitamin (MULTIVITAMIN) tablet Take 2 tablets by mouth daily. Gummies  . rivaroxaban (XARELTO) 20 MG TABS tablet TAKE 1 TABLET(20 MG) BY MOUTH DAILY WITH SUPPER  . rosuvastatin (CRESTOR) 20 MG tablet TAKE 1 TABLET(20 MG) BY MOUTH DAILY   No facility-administered encounter medications on file as of 09/03/2020.    Current Diagnosis: Patient Active Problem List   Diagnosis Date Noted  . Cervical spondylosis with myelopathy and radiculopathy 02/02/2020  . Chronic neck pain 07/17/2019  . LLQ pain 05/16/2019  . Gross hematuria 05/16/2019  . Chronic bilateral low back pain without sciatica 10/07/2018  . Pain and swelling of left lower leg 08/08/2018  . Left arm swelling 08/08/2018  . Hematuria, gross 07/29/2018  . Thoracic aortic dissection ([place]) 06/13/2018  . Intramural aortic hematoma ([place]) 05/28/2018  . Dysfunction of left eustachian tube 04/05/2018  . Preoperative clearance 02/27/2018  . Bilateral leg edema 02/27/2018  . Dyspnea 06/19/2016  . Routine general medical examination at a health care facility 06/24/2015  . Constipation 03/17/2015  . Myalgia and myositis 03/17/2015  . Dyslipidemia   . Atrial  fibrillation ([place])   . Hypertension   . History of pulmonary embolism 01/10/2013  . Anemia 01/10/2013  . Arthritis 01/03/2013    Goals Addressed   None     Follow-Up:  Pharmacist Review   Spoke with the patient who states that she is doing well and that she is not having any problems with medication. She did state that she saw Dr. Ninfa Linden because she was having a lot of pain in her left hip, which now she is having pain in her right hip. She states that she is still on a diet but doesn't understand why she is  still gaining weight. Patient stated that she doesn't have any concerns at this time or anything that she would like to discuss right now.     Wendy Poet, Clinical Pharmacist Assistant [facility]

## 2020-09-06 ENCOUNTER — Ambulatory Visit: Payer: Medicare Other | Admitting: Physical Medicine and Rehabilitation

## 2020-09-09 DIAGNOSIS — R310 Gross hematuria: Secondary | ICD-10-CM | POA: Diagnosis not present

## 2020-09-14 ENCOUNTER — Ambulatory Visit (INDEPENDENT_AMBULATORY_CARE_PROVIDER_SITE_OTHER): Payer: Medicare Other | Admitting: Physical Medicine and Rehabilitation

## 2020-09-14 ENCOUNTER — Encounter: Payer: Self-pay | Admitting: Physical Medicine and Rehabilitation

## 2020-09-14 ENCOUNTER — Other Ambulatory Visit: Payer: Self-pay | Admitting: Urology

## 2020-09-14 ENCOUNTER — Telehealth: Payer: Self-pay | Admitting: Cardiology

## 2020-09-14 ENCOUNTER — Other Ambulatory Visit: Payer: Self-pay

## 2020-09-14 ENCOUNTER — Ambulatory Visit: Payer: Self-pay

## 2020-09-14 VITALS — BP 122/67 | HR 76

## 2020-09-14 DIAGNOSIS — M5416 Radiculopathy, lumbar region: Secondary | ICD-10-CM

## 2020-09-14 MED ORDER — METHYLPREDNISOLONE ACETATE 80 MG/ML IJ SUSP
80.0000 mg | Freq: Once | INTRAMUSCULAR | Status: AC
  Administered 2020-09-14: 80 mg

## 2020-09-14 NOTE — Telephone Encounter (Signed)
   [facility] Pre-operative Risk Assessment    HEARTCARE STAFF: - Please ensure there is not already an duplicate clearance open for this procedure. - Under Visit Info/Reason for Call, type in Other and utilize the format Clearance MM/DD/YY or Clearance TBD. Do not use dashes or single digits. - If request is for dental extraction, please clarify the # of teeth to be extracted.  Request for surgical clearance:  1. What type of surgery is being performed? Ureteroscopy with biopsies   2. When is this surgery scheduled? 11/02/20  3. What type of clearance is required (medical clearance vs. Pharmacy clearance to hold med vs. Both)? both  4. Are there any medications that need to be held prior to surgery and how long? Aspirin, 5 days. Xarelto, 72 hours  5. Practice name and name of physician performing surgery? Dr. Claudia Desanctis at [facility]  6. What is the office phone number? [phone] ext [phone]   7.   What is the office fax number? [phone]  8.   Anesthesia type (None, local, MAC, general) ? General   Leah Newnam 09/14/2020, 11:30 AM  _________________________________________________________________   (provider comments below)

## 2020-09-14 NOTE — Telephone Encounter (Signed)
Hi Dr. Curt Bears. Ms. Tellefsen has ureteroscopy with biopsies planned for 11/02/2020 and is being asked to hold Xarelto and Aspirin? She has a history of long-standing atrial fibrillation, PE after left THR in 2014, HTN, HLD. She also has a history of acute intramural hematoma of her thoracic aorta that extended from left subclavian artery to renal arteries and has been followed by Vascular Surgery. She was last seen by Renee in 04/2020 and was doing well at that time. Can you please give your recommendation for holding Aspirin prior to procedure? Please route response back to P CV DIV PREOP.  Pharmacy, can you please comment on how long Xarelto should be held?  Thank you both! Verina Galeno

## 2020-09-14 NOTE — Progress Notes (Signed)
L3, recc l4-5 facets for right more than left back pain vs PT, Pt state lower back pain mostly oher right side that travels to the left side groin area. Pt state walking and laying down makes the pain worse. Pt state trying to lift her leg makes the pain worse.  Numeric Pain Rating Scale and Functional Assessment Average Pain 4   In the last MONTH (on 0-10 scale) has pain interfered with the following?  1. General activity like being  able to carry out your everyday physical activities such as walking, climbing stairs, carrying groceries, or moving a chair?  Rating(10)   +Driver, +BT Xarelto ok for injeciton, -Dye Allergies.

## 2020-09-15 NOTE — Telephone Encounter (Addendum)
   Primary Cardiologist: Will Meredith Leeds, MD  Chart reviewed as part of pre-operative protocol coverage. Patient was last seen by Tommye Standard, PA-C, in 04/2020 at which time she was doing well from a cardiac standpoint. Patient was contacted today for further pre-op evaluation and reported doing well since last visit. No chest pain, significant shortness of breath, orthopnea, PND, worsening edema from baseline, palpitations, dizziness, or syncope. She does ambulate with a cane sometimes but able to complete >4.0 METS. Given past medical history and time since last visit, based on ACC/AHA guidelines, Venie Montesinos would be at acceptable risk for the planned procedure without further cardiovascular testing.   Per Dr. Curt Bears and Pharmacy: Patient can hold Xarelto for 3 days and Aspirin for 5 days prior to procedure. Both should be restarted as soon as possible following procedure.  I will route this recommendation to the requesting party via Epic fax function and remove from pre-op pool.  Please call with questions.  Darreld Mclean, PA-C 09/15/2020, 8:38 AM

## 2020-09-15 NOTE — Telephone Encounter (Signed)
Patient with diagnosis of afib on Xarelto for anticoagulation.    Procedure: ureteroscopy with biopsies Date of procedure: 11/02/20  CHA2DS2-VASc Score = 5  This indicates a 7.2% annual risk of stroke. The patient's score is based upon: CHF History: 1 HTN History: 1 Diabetes History: 0 Stroke History: 0 Vascular Disease History: 1 Age Score: 1 Gender Score: 1   Did also have a provoked PE in 2014 one week after hip replacement - this was treated with anticoag for 6 months then stopped. Anticoag not resumed later until afib dx.  CrCl 65mL/min Platelet count 297K  Per office protocol, patient can hold Xarelto for 3 days prior to procedure as requested.

## 2020-09-15 NOTE — Telephone Encounter (Signed)
Ok to hold aspirin restart ASAP post procedure. Xarelto as below.

## 2020-10-06 ENCOUNTER — Ambulatory Visit (INDEPENDENT_AMBULATORY_CARE_PROVIDER_SITE_OTHER): Payer: Medicare Other | Admitting: Orthopaedic Surgery

## 2020-10-06 ENCOUNTER — Other Ambulatory Visit: Payer: Self-pay | Admitting: Orthopaedic Surgery

## 2020-10-06 ENCOUNTER — Encounter: Payer: Self-pay | Admitting: Orthopaedic Surgery

## 2020-10-06 DIAGNOSIS — M7989 Other specified soft tissue disorders: Secondary | ICD-10-CM

## 2020-10-06 MED ORDER — METHYLPREDNISOLONE 4 MG PO TABS: ORAL_TABLET | ORAL | 0 refills | Status: DC

## 2020-10-06 MED ORDER — TIZANIDINE HCL 4 MG PO TABS: 4.0000 mg | ORAL_TABLET | Freq: Three times a day (TID) | ORAL | 1 refills | Status: DC | PRN

## 2020-10-06 NOTE — Progress Notes (Signed)
Office Visit Note   Patient: Samantha Stevenson           Date of Birth: 1951-10-06           MRN: [id number] Visit Date: 10/06/2020              Requested by: Hoyt Koch, MD [address] [place],  [place] [address] PCP: Hoyt Koch, MD   Assessment & Plan: Visit Diagnoses:  1. Left leg swelling     Plan: I did place a steroid injection in her left knee today and will order a nonurgent Doppler ultrasound of left lower extremity to make sure she does not have a DVT.  I would like to see her back in 2 weeks after course of the muscle relaxant and 6 days of a steroid taper.  At that visit I would like an AP and lateral lumbar spine and AP pelvis and lateral of her left hip.  If she is still having problems the left lower extremity we can obtain an AP lateral of the left knee.  All questions concerns were answered and addressed.  We will be in touch about the Doppler ultrasound.  Follow-Up Instructions: Return in about 2 weeks (around 10/20/2020).   Orders:  Orders Placed This Encounter  Procedures  . VAS [place] LOWER EXTREMITY VENOUS (DVT)   Meds ordered this encounter  Medications  . tiZANidine (ZANAFLEX) 4 MG tablet    Sig: Take 1 tablet (4 mg total) by mouth every 8 (eight) hours as needed for muscle spasms.    Dispense:  40 tablet    Refill:  1  . methylPREDNISolone (MEDROL) 4 MG tablet    Sig: Medrol dose pack. Take as instructed    Dispense:  21 tablet    Refill:  0      Procedures: No procedures performed   Clinical Data: No additional findings.   Subjective: Chief Complaint  Patient presents with  . Left Leg - Edema  The patient is worked in today for left leg swelling has been going on for X about a month now.  She is on 20 mg of Xarelto daily.  She reports calf pain and knee pain and leg and ankle foot and swelling.  She does wear compressive socks on both sides.  She did let me know that her low back to the right side is been hurting for  a long period of time.  This was after we were already seeing her.  She does have a history of a left total hip arthroplasty that we replaced in 2014 and she is having some groin pain.  She is using cane to ambulate.  HPI  Review of Systems She currently denies any headache, chest pain, shortness of breath, fever, chills, nausea, vomiting  Objective: Vital Signs: There were no vitals taken for this visit.  Physical Exam She is alert and orient x3 and in no acute distress Ortho Exam Examination of both legs and ankle shows some slightly more swelling on the left than the right.  Her knee is also painful and swollen.  She has good range of motion the knee but it does hurt.  Her range of motion of her left hip is normal. Specialty Comments:  No specialty comments available.  Imaging: No results found.   PMFS History: Patient Active Problem List   Diagnosis Date Noted  . Cervical spondylosis with myelopathy and radiculopathy 02/02/2020  . Chronic neck pain 07/17/2019  . LLQ pain  05/16/2019  . Gross hematuria 05/16/2019  . Chronic bilateral low back pain without sciatica 10/07/2018  . Pain and swelling of left lower leg 08/08/2018  . Left arm swelling 08/08/2018  . Hematuria, gross 07/29/2018  . Thoracic aortic dissection ([facility]) 06/13/2018  . Intramural aortic hematoma ([facility]) 05/28/2018  . Dysfunction of left eustachian tube 04/05/2018  . Preoperative clearance 02/27/2018  . Bilateral leg edema 02/27/2018  . Dyspnea 06/19/2016  . Routine general medical examination at a health care facility 06/24/2015  . Constipation 03/17/2015  . Myalgia and myositis 03/17/2015  . Dyslipidemia   . Atrial fibrillation ([facility])   . Hypertension   . History of pulmonary embolism 01/10/2013  . Anemia 01/10/2013  . Arthritis 01/03/2013   Past Medical History:  Diagnosis Date  . Allergy   . Anemia   . Arthritis    "qwhere" (07/29/2018)  . Atrial fibrillation ([facility])   . CHF (congestive heart  failure) ([facility])   . Clotting disorder ([facility])   . Degenerative arthritis of hip    s/p L THR 12/2012  . Dyspnea    since surgery in August 2019- "when I get worked up and walk a short distance"  . Dysrhythmia   . History of blood transfusion 05/2018   "related to OR"  . Hyperlipidemia   . Hypertension   . Intramural hematoma of thoracic aorta ([facility]) 06/13/2018  . Migraines    mIgraines- none since blood pressure and lipids are under control  . Myocardial infarction ([facility]) 2000   due to atrial fib  . Neuromuscular disorder ([facility])    pinched nerve- left side of neck  . Pulmonary emboli ([facility]) 12/2012 dx   post op (L THR), anticoag x 62mo    Family History  Problem Relation Age of Onset  . Heart disease Father   . Diabetes Mother   . Heart disease Mother        pacemaker  . Heart attack Mother 83  . Diabetes Sister   . Alcohol abuse Other   . Heart disease Other   . Hyperlipidemia Other   . Hypertension Other   . Diabetes Other   . Breast cancer Other   . Colon cancer Neg Hx   . Esophageal cancer Neg Hx   . Stomach cancer Neg Hx   . Rectal cancer Neg Hx     Past Surgical History:  Procedure Laterality Date  . ABDOMINAL HYSTERECTOMY     "w/1 tube"  . ANTERIOR CERVICAL DECOMPRESSION/DISCECTOMY FUSION 4 LEVELS N/A 02/02/2020   Procedure: CERVICAL THREE-FOUR, CERVICAL FOUR-FIVE, CERVICAL FIVE-SIX, CERVICAL SIX-SEVEN ANTERIOR CERVICAL DECOMPRESSION/DISCECTOMY FUSION;  Surgeon: Earnie Larsson, MD;  Location: [facility];  Service: Neurosurgery;  Laterality: N/A;  . ARTERY REPAIR Left 08/15/2018   Procedure: LEFT BRACHIAL ARTERY EXPLORATION;  Surgeon: Waynetta Sandy, MD;  Location: [facility];  Service: Vascular;  Laterality: Left;  . Breast Ultrasound Left 04/16/13   Done @ breast center Impression: no malignancy appearance noted on the screen study is consistent with a summation shadow  . CARDIAC CATHETERIZATION    . CAROTID-SUBCLAVIAN BYPASS GRAFT Left 06/13/2018   Procedure: BYPASS  GRAFT CAROTID-SUBCLAVIAN;  Surgeon: Waynetta Sandy, MD;  Location: [facility];  Service: Vascular;  Laterality: Left;  . COLONOSCOPY    . DG TUMB RIGHT HAND Right    cyst removal  . IR THORACENTESIS ASP PLEURAL SPACE W/IMG GUIDE  05/31/2018  . JOINT REPLACEMENT    . THORACIC AORTIC ENDOVASCULAR STENT GRAFT Left 06/13/2018   Procedure:  LEFT  SUBCLAVIAN TO CAROTID ARTERY TRANSPOSITION; THORACIC AORTIC ENDOVASCULAR STENT GRAFT using GORE CONFORMABLE THORACIC STENT GRAFT AND GORE TAG THORACIC ENDOPROSTHESIS; STENT LEFT COMMON CAROTID ARTERY, RIGHT COMMON-FEMORAL ENDARTERECTOMY WITH PATCH ANGIOPLASTY;  Surgeon: Waynetta Sandy, MD;  Location: [facility];  Service: Vascular;  Laterality: Left;  . TONSILLECTOMY    . TOTAL HIP ARTHROPLASTY Left 01/03/2013   Procedure: TOTAL HIP ARTHROPLASTY ANTERIOR APPROACH;  Surgeon: Mcarthur Rossetti, MD;  Location[facility];  Service: Orthopedics;  Laterality: Left;  Left Total Hip Arthroplasty, Anterior Approach  . TUBAL LIGATION    . VAGINA RECONSTRUCTION SURGERY  1966   vagina had closed up when 69 years old  . WOUND EXPLORATION Left 06/16/2018   Procedure: LEFT NECK WOUND EXPLORATION, CHEST TUBE INSERTION;  Surgeon: Waynetta Sandy, MD;  Location: [facility];  Service: Vascular;  Laterality: Left;   Social History   Occupational History  . Occupation: retired  Tobacco Use  . Smoking status: Never Smoker  . Smokeless tobacco: Never Used  Vaping Use  . Vaping Use: Never used  Substance and Sexual Activity  . Alcohol use: Not Currently  . Drug use: Never  . Sexual activity: Not Currently

## 2020-10-07 ENCOUNTER — Other Ambulatory Visit: Payer: Self-pay

## 2020-10-07 ENCOUNTER — Ambulatory Visit (HOSPITAL_COMMUNITY)
Admission: RE | Admit: 2020-10-07 | Discharge: 2020-10-07 | Disposition: A | Discharge status: Home / Self Care | Payer: Medicare Other | Source: Ambulatory Visit | Attending: Orthopaedic Surgery | Admitting: Orthopaedic Surgery

## 2020-10-07 DIAGNOSIS — M7989 Other specified soft tissue disorders: Secondary | ICD-10-CM | POA: Diagnosis not present

## 2020-10-20 ENCOUNTER — Ambulatory Visit (INDEPENDENT_AMBULATORY_CARE_PROVIDER_SITE_OTHER): Payer: Medicare Other

## 2020-10-20 ENCOUNTER — Ambulatory Visit (INDEPENDENT_AMBULATORY_CARE_PROVIDER_SITE_OTHER): Payer: Medicare Other | Admitting: Orthopaedic Surgery

## 2020-10-20 ENCOUNTER — Other Ambulatory Visit: Payer: Self-pay | Admitting: Orthopaedic Surgery

## 2020-10-20 ENCOUNTER — Other Ambulatory Visit: Payer: Self-pay

## 2020-10-20 DIAGNOSIS — M5442 Lumbago with sciatica, left side: Secondary | ICD-10-CM | POA: Diagnosis not present

## 2020-10-20 DIAGNOSIS — Z96642 Presence of left artificial hip joint: Secondary | ICD-10-CM

## 2020-10-20 DIAGNOSIS — M7989 Other specified soft tissue disorders: Secondary | ICD-10-CM

## 2020-10-20 DIAGNOSIS — M79672 Pain in left foot: Secondary | ICD-10-CM | POA: Diagnosis not present

## 2020-10-20 DIAGNOSIS — M25562 Pain in left knee: Secondary | ICD-10-CM | POA: Diagnosis not present

## 2020-10-20 MED ORDER — ACETAMINOPHEN-CODEINE #3 300-30 MG PO TABS: 1.0000 | ORAL_TABLET | Freq: Three times a day (TID) | ORAL | 0 refills | Status: DC | PRN

## 2020-10-20 NOTE — Progress Notes (Signed)
Office Visit Note   Patient: Samantha Stevenson           Date of Birth: 1951-04-22           MRN: [id number] Visit Date: 10/20/2020              Requested by: Myrlene Broker, MD [address] [place],  [place] [address] PCP: Myrlene Broker, MD   Assessment & Plan: Visit Diagnoses:  1. Left leg swelling   2. Acute left-sided low back pain with left-sided sciatica   3. Left knee pain, unspecified chronicity   4. Pain in left foot   5. History of total left hip arthroplasty     Plan: At this point I would like to obtain an MRI of both her left hip and her left knee.  The left hip we want to rule out any fluid collections or loosening of the prosthesis.  The left knee we need to look at the Baker's cyst or any other signs or pathologic findings that can be causing the swelling in her leg.  She agrees with this treatment plan.  We will try some Tylenol 3 for pain.  We will see her back once she has the MRIs of her left hip and her left knee.  Follow-Up Instructions: No follow-ups on file.  The patient will call for follow-up once she has the dates for her MRIs.  I would like to see her at least 2 to 3 days after the MRIs.  Orders:  Orders Placed This Encounter  Procedures  . XR Lumbar Spine 2-3 Views  . XR HIP UNILAT W OR W/O PELVIS 1V LEFT  . XR Knee 1-2 Views Left  . XR Foot Complete Left   No orders of the defined types were placed in this encounter.     Procedures: No procedures performed   Clinical Data: No additional findings.   Subjective: Chief Complaint  Patient presents with  . Left Knee - Follow-up, Pain  . Lower Back - Pain, Follow-up  . Left Foot - Pain  The patient comes in with continued left lower extremity pain from her hip all the way down to her foot and ankle with some back pain as well.  She has a history of some type of aortic stent done years ago.  Her left leg does swell her quite a bit and he cannot associate swelling compared to  the other side.  She does have compressive garments at home and I suggested she get back into these for the left side.  She is followed by Dr. Gilmer Mor from vascular surgery.  He has not seen her in a while.  A recent DVT study with Doppler ultrasound was negative for DVT but did show a Baker's cyst which the patient is concerned about.  She does ambulate using a cane as well.  I replaced her left hip in 2014.  She does have a lot of hip and groin pain as well.  HPI  Review of Systems She currently denies any headache, chest pain, shortness of breath, fever, chills, nausea, vomiting  Objective: Vital Signs: There were no vitals taken for this visit.  Physical Exam She is alert and orient x3 and in no acute distress Ortho Exam She does ambulate a cane.  She has pain when I put her left hip through internal or external rotation which is pretty severe.  She has pain with flexion extension of her left knee that is severe.  She has pain over the lateral aspect of her left foot.  There is no open wounds.  Her left calf is bigger than the right side with obviously edema. Specialty Comments:  No specialty comments available.  Imaging: XR Foot Complete Left  Result Date: 10/20/2020 3 views left foot show no acute findings.  XR HIP UNILAT W OR W/O PELVIS 1V LEFT  Result Date: 10/20/2020 An AP pelvis and lateral left hip shows a total hip arthroplasty with no acute findings  XR Knee 1-2 Views Left  Result Date: 10/20/2020 2 views of the left knee show no acute findings.  There is mild to moderate arthritic changes.  XR Lumbar Spine 2-3 Views  Result Date: 10/20/2020 2 views lumbar spine show loss of lumbar lordosis and degenerative changes at multiple levels.  There is slight curvature in the AP plane.    PMFS History: Patient Active Problem List   Diagnosis Date Noted  . Cervical spondylosis with myelopathy and radiculopathy 02/02/2020  . Chronic neck pain 07/17/2019  . LLQ pain  05/16/2019  . Gross hematuria 05/16/2019  . Chronic bilateral low back pain without sciatica 10/07/2018  . Pain and swelling of left lower leg 08/08/2018  . Left arm swelling 08/08/2018  . Hematuria, gross 07/29/2018  . Thoracic aortic dissection ([facility]) 06/13/2018  . Intramural aortic hematoma ([facility]) 05/28/2018  . Dysfunction of left eustachian tube 04/05/2018  . Preoperative clearance 02/27/2018  . Bilateral leg edema 02/27/2018  . Dyspnea 06/19/2016  . Routine general medical examination at a health care facility 06/24/2015  . Constipation 03/17/2015  . Myalgia and myositis 03/17/2015  . Dyslipidemia   . Atrial fibrillation ([facility])   . Hypertension   . History of pulmonary embolism 01/10/2013  . Anemia 01/10/2013  . Arthritis 01/03/2013   Past Medical History:  Diagnosis Date  . Allergy   . Anemia   . Arthritis    "qwhere" (07/29/2018)  . Atrial fibrillation ([facility])   . CHF (congestive heart failure) ([facility])   . Clotting disorder ([facility])   . Degenerative arthritis of hip    s/p L THR 12/2012  . Dyspnea    since surgery in August 2019- "when I get worked up and walk a short distance"  . Dysrhythmia   . History of blood transfusion 05/2018   "related to OR"  . Hyperlipidemia   . Hypertension   . Intramural hematoma of thoracic aorta ([facility]) 06/13/2018  . Migraines    mIgraines- none since blood pressure and lipids are under control  . Myocardial infarction ([facility]) 2000   due to atrial fib  . Neuromuscular disorder ([facility])    pinched nerve- left side of neck  . Pulmonary emboli ([facility]) 12/2012 dx   post op (L THR), anticoag x 8mo    Family History  Problem Relation Age of Onset  . Heart disease Father   . Diabetes Mother   . Heart disease Mother        pacemaker  . Heart attack Mother 73  . Diabetes Sister   . Alcohol abuse Other   . Heart disease Other   . Hyperlipidemia Other   . Hypertension Other   . Diabetes Other   . Breast cancer Other   . Colon cancer Neg Hx   .  Esophageal cancer Neg Hx   . Stomach cancer Neg Hx   . Rectal cancer Neg Hx     Past Surgical History:  Procedure Laterality Date  . ABDOMINAL HYSTERECTOMY     "  w/1 tube"  . ANTERIOR CERVICAL DECOMPRESSION/DISCECTOMY FUSION 4 LEVELS N/A 02/02/2020   Procedure: CERVICAL THREE-FOUR, CERVICAL FOUR-FIVE, CERVICAL FIVE-SIX, CERVICAL SIX-SEVEN ANTERIOR CERVICAL DECOMPRESSION/DISCECTOMY FUSION;  Surgeon: Earnie Larsson, MD;  Location: [facility];  Service: Neurosurgery;  Laterality: N/A;  . ARTERY REPAIR Left 08/15/2018   Procedure: LEFT BRACHIAL ARTERY EXPLORATION;  Surgeon: Waynetta Sandy, MD;  Location: [facility];  Service: Vascular;  Laterality: Left;  . Breast Ultrasound Left 04/16/13   Done @ breast center Impression: no malignancy appearance noted on the screen study is consistent with a summation shadow  . CARDIAC CATHETERIZATION    . CAROTID-SUBCLAVIAN BYPASS GRAFT Left 06/13/2018   Procedure: BYPASS GRAFT CAROTID-SUBCLAVIAN;  Surgeon: Waynetta Sandy, MD;  Location: [facility];  Service: Vascular;  Laterality: Left;  . COLONOSCOPY    . DG TUMB RIGHT HAND Right    cyst removal  . IR THORACENTESIS ASP PLEURAL SPACE W/IMG GUIDE  05/31/2018  . JOINT REPLACEMENT    . THORACIC AORTIC ENDOVASCULAR STENT GRAFT Left 06/13/2018   Procedure: LEFT  SUBCLAVIAN TO CAROTID ARTERY TRANSPOSITION; THORACIC AORTIC ENDOVASCULAR STENT GRAFT using GORE CONFORMABLE THORACIC STENT GRAFT AND GORE TAG THORACIC ENDOPROSTHESIS; STENT LEFT COMMON CAROTID ARTERY, RIGHT COMMON-FEMORAL ENDARTERECTOMY WITH PATCH ANGIOPLASTY;  Surgeon: Waynetta Sandy, MD;  Location: [facility];  Service: Vascular;  Laterality: Left;  . TONSILLECTOMY    . TOTAL HIP ARTHROPLASTY Left 01/03/2013   Procedure: TOTAL HIP ARTHROPLASTY ANTERIOR APPROACH;  Surgeon: Mcarthur Rossetti, MD;  [facility];  Service: Orthopedics;  Laterality: Left;  Left Total Hip Arthroplasty, Anterior Approach  . TUBAL LIGATION    . VAGINA  RECONSTRUCTION SURGERY  1966   vagina had closed up when 69 years old  . WOUND EXPLORATION Left 06/16/2018   Procedure: LEFT NECK WOUND EXPLORATION, CHEST TUBE INSERTION;  Surgeon: Waynetta Sandy, MD;  Location: [facility];  Service: Vascular;  Laterality: Left;   Social History   Occupational History  . Occupation: retired  Tobacco Use  . Smoking status: Never Smoker  . Smokeless tobacco: Never Used  Vaping Use  . Vaping Use: Never used  Substance and Sexual Activity  . Alcohol use: Not Currently  . Drug use: Never  . Sexual activity: Not Currently

## 2020-10-25 NOTE — Patient Instructions (Addendum)
DUE TO COVID-19 ONLY ONE VISITOR IS ALLOWED IN WAITING ROOM (VISITOR WILL HAVE A TEMPERATURE CHECK ON ARRIVAL AND MUST WEAR A FACE MASK THE ENTIRE TIME.)  ONCE YOU ARE ADMITTED TO YOUR PRIVATE ROOM, THE SAME ONE VISITOR IS ALLOWED TO VISIT DURING VISITING HOURS ONLY.  Your COVID swab testing is scheduled for: 10/29/20  At: 10:00 am  , You must self quarantine after your testing per handout given to you at the testing site. [address] [place], [place] [address]  (Must self quarantine after testing. Follow instructions on handout.)       Your procedure is scheduled on: 11/02/09  Report to [facility] AT: 9:30  A. M.   Call this number if you have problems the morning of surgery:[phone].   OUR ADDRESS IS [address]  WE ARE LOCATED IN THE [place]                                   [facility].                                     REMEMBER:   DO NOT EAT SOLID FOOD AFTER MIDNIGHT . CLEAR LIQUIDS UNTIL: 8:30 AM   CLEAR LIQUID DIET  Foods Allowed                                                                     Foods Excluded  Coffee and tea, regular and decaf                             liquids that you cannot  Plain Jell-O any favor except red or purple                                           see through such as: Fruit ices (not with fruit pulp)                                     milk, soups, orange juice  Iced Popsicles                                    All solid food Carbonated beverages, regular and diet                                    Cranberry, grape and apple juices Sports drinks like Gatorade Lightly seasoned clear broth or consume(fat free) Sugar, honey syrup  Sample Menu Breakfast                                Lunch  Supper Cranberry juice                    Beef broth                            Chicken broth Jell-O                                     Grape juice                           Apple  juice Coffee or tea                        Jell-O                                      Popsicle                                                Coffee or tea                        Coffee or tea  _____________________________________________________________________  BRUSH YOUR TEETH THE MORNING OF SURGERY.  TAKE THESE MEDICATIONS MORNING OF SURGERY WITH A SIP OF WATER:  Diltiazem.  DO NOT WEAR JEWERLY, MAKE UP, OR NAIL POLISH.  DO NOT WEAR LOTIONS, POWDERS, PERFUMES/COLOGNE OR DEODORANT.  DO NOT SHAVE FOR 24 HOURS PRIOR TO DAY OF SURGERY.  CONTACTS, GLASSES, OR DENTURES MAY NOT BE WORN TO SURGERY.                                    [facility] IS NOT RESPONSIBLE  FOR ANY BELONGINGS.        ____________________________________________________________________                               [facility] may not have any metal on your body including hair pins and              piercings  Do not wear jewelry, make-up, lotions, powders or perfumes, deodorant             Do not wear nail polish on your fingernails.  Do not shave  48 hours prior to surgery.           Do not bring valuables to the hospital. [place].  Contacts, dentures or bridgework may not be worn into surgery.  Leave suitcase in the car. After surgery it may be brought to your room.     Patients discharged the day of surgery will not be allowed to drive home. IF YOU ARE HAVING SURGERY AND GOING HOME THE SAME DAY, YOU MUST HAVE AN ADULT TO DRIVE YOU HOME AND BE WITH YOU FOR 24 HOURS. YOU MAY GO HOME BY TAXI OR UBER OR ORTHERWISE, BUT AN ADULT MUST  ACCOMPANY YOU HOME AND STAY WITH YOU FOR 24 HOURS.  Name and phone number of your driver:  Special Instructions: N/A              Please read over the following fact sheets you were given: _____________________________________________________________________        [facility] - Preparing for Surgery Before surgery, you can play an  important role.  Because skin is not sterile, your skin needs to be as free of germs as possible.  You can reduce the number of germs on your skin by washing with CHG (chlorahexidine gluconate) soap before surgery.  CHG is an antiseptic cleaner which kills germs and bonds with the skin to continue killing germs even after washing. Please DO NOT use if you have an allergy to CHG or antibacterial soaps.  If your skin becomes reddened/irritated stop using the CHG and inform your nurse when you arrive at Short Stay. Do not shave (including legs and underarms) for at least 48 hours prior to the first CHG shower.  You may shave your face/neck. Please follow these instructions carefully:  1.  Shower with CHG Soap the night before surgery and the  morning of Surgery.  2.  If you choose to wash your hair, wash your hair first as usual with your  normal  shampoo.  3.  After you shampoo, rinse your hair and body thoroughly to remove the  shampoo.                           4.  Use CHG as you would any other liquid soap.  You can apply chg directly  to the skin and wash                       Gently with a scrungie or clean washcloth.  5.  Apply the CHG Soap to your body ONLY FROM THE NECK DOWN.   Do not use on face/ open                           Wound or open sores. Avoid contact with eyes, ears mouth and genitals (private parts).                       Wash face,  Genitals (private parts) with your normal soap.             6.  Wash thoroughly, paying special attention to the area where your surgery  will be performed.  7.  Thoroughly rinse your body with warm water from the neck down.  8.  DO NOT shower/wash with your normal soap after using and rinsing off  the CHG Soap.                9.  Pat yourself dry with a clean towel.            10.  Wear clean pajamas.            11.  Place clean sheets on your bed the night of your first shower and do not  sleep with pets. Day of Surgery : Do not apply any  lotions/deodorants the morning of surgery.  Please wear clean clothes to the hospital/surgery center.  FAILURE TO FOLLOW THESE INSTRUCTIONS MAY RESULT IN THE CANCELLATION OF YOUR SURGERY PATIENT SIGNATURE_________________________________  NURSE SIGNATURE__________________________________  ________________________________________________________________________

## 2020-10-26 ENCOUNTER — Encounter (HOSPITAL_COMMUNITY): Payer: Self-pay

## 2020-10-26 ENCOUNTER — Other Ambulatory Visit: Payer: Self-pay

## 2020-10-26 ENCOUNTER — Encounter (HOSPITAL_COMMUNITY)
Admission: RE | Admit: 2020-10-26 | Discharge: 2020-10-26 | Disposition: A | Discharge status: Home / Self Care | Payer: Medicare Other | Source: Ambulatory Visit | Attending: Urology | Admitting: Urology

## 2020-10-26 DIAGNOSIS — Z7901 Long term (current) use of anticoagulants: Secondary | ICD-10-CM | POA: Diagnosis not present

## 2020-10-26 DIAGNOSIS — I110 Hypertensive heart disease with heart failure: Secondary | ICD-10-CM | POA: Diagnosis not present

## 2020-10-26 DIAGNOSIS — R310 Gross hematuria: Secondary | ICD-10-CM | POA: Diagnosis not present

## 2020-10-26 DIAGNOSIS — Z79899 Other long term (current) drug therapy: Secondary | ICD-10-CM | POA: Diagnosis not present

## 2020-10-26 DIAGNOSIS — Z7982 Long term (current) use of aspirin: Secondary | ICD-10-CM | POA: Diagnosis not present

## 2020-10-26 DIAGNOSIS — I252 Old myocardial infarction: Secondary | ICD-10-CM | POA: Diagnosis not present

## 2020-10-26 DIAGNOSIS — Z951 Presence of aortocoronary bypass graft: Secondary | ICD-10-CM | POA: Insufficient documentation

## 2020-10-26 DIAGNOSIS — I4891 Unspecified atrial fibrillation: Secondary | ICD-10-CM | POA: Diagnosis not present

## 2020-10-26 DIAGNOSIS — Z01812 Encounter for preprocedural laboratory examination: Secondary | ICD-10-CM | POA: Insufficient documentation

## 2020-10-26 DIAGNOSIS — I509 Heart failure, unspecified: Secondary | ICD-10-CM | POA: Insufficient documentation

## 2020-10-26 DIAGNOSIS — Z86711 Personal history of pulmonary embolism: Secondary | ICD-10-CM | POA: Diagnosis not present

## 2020-10-26 DIAGNOSIS — Z96642 Presence of left artificial hip joint: Secondary | ICD-10-CM | POA: Diagnosis not present

## 2020-10-26 LAB — BASIC METABOLIC PANEL
Anion gap: 7 (ref 5–15)
BUN: 12 mg/dL (ref 8–23)
CO2: 29 mmol/L (ref 22–32)
Calcium: 9.1 mg/dL (ref 8.9–10.3)
Chloride: 105 mmol/L (ref 98–111)
Creatinine, Ser: 0.72 mg/dL (ref 0.44–1.00)
GFR, Estimated: >60 mL/min (ref >60)
Glucose, Bld: 99 mg/dL (ref 70–99)
Potassium: 4 mmol/L (ref 3.5–5.1)
Sodium: 141 mmol/L (ref 135–145)

## 2020-10-26 LAB — CBC
HCT: 33.9 % — ABNORMAL LOW (ref 36.0–46.0)
Hemoglobin: 10.1 g/dL — ABNORMAL LOW (ref 12.0–15.0)
MCH: 27.2 pg (ref 26.0–34.0)
MCHC: 29.8 g/dL — ABNORMAL LOW (ref 30.0–36.0)
MCV: 91.4 fL (ref 80.0–100.0)
Platelets: 333 10*3/uL (ref 150–400)
RBC: 3.71 MIL/uL — ABNORMAL LOW (ref 3.87–5.11)
RDW: 15.7 % — ABNORMAL HIGH (ref 11.5–15.5)
WBC: 5.3 10*3/uL (ref 4.0–10.5)
nRBC: 0 % (ref 0.0–0.2)

## 2020-10-26 NOTE — Progress Notes (Addendum)
COVID Vaccine Completed: Yes Date COVID Vaccine completed: 07/03/20 COVID vaccine manufacturer: Pfizer     PCP - Dr. Hillard Danker. Cardiologist - Dr. Jorja Loa: Clearance: 11/02/20. : EPIC  Chest x-ray -  EKG - 05/04/20 Stress Test -  ECHO - 10/20/19 Cardiac Cath -  Pacemaker/ICD device last checked:  Sleep Study -  CPAP -   Fasting Blood Sugar -  Checks Blood Sugar _____ times a day  Blood Thinner Instructions: Xarelto will be on hold: 3 days before surgery as per Dr. Elberta Fortis. Aspirin Instructions: Last Dose:  Anesthesia review: Hx: HTN,MI,Afib,CHF,PE  Patient denies shortness of breath, fever, cough and chest pain at PAT appointment   Patient verbalized understanding of instructions that were given to them at the PAT appointment. Patient was also instructed that they will need to review over the PAT instructions again at home before surgery.

## 2020-10-27 NOTE — Progress Notes (Signed)
Anesthesia Chart Review   Case: [id number] Date/Time: 11/02/20 1115   Procedures:      CYSTOSCOPY WITH RETROGRADE PYELOGRAM, URETEROSCOPY WITH POSSIBLE BIOPSY AND STENT PLACEMENT (Bilateral ) - 10 MINS     HOLMIUM LASER APPLICATION (Bilateral )     CYSTOSCOPY WITH BIOPSY (N/A )   Anesthesia type: General   Pre-op diagnosis: GROSS HEMATURIA   Location: [facility] 2 / [place]   Surgeons: Robley Fries, MD      DISCUSSION:70 y.o. never smoker with h/o HTN, CHF, a-fib (on Xarelto), gross hematuria scheduled for above procedure 11/02/20 with Dr. Jacalyn Lefevre.   Per cardiology preoperative risk assessment 09/15/2020, "Chart reviewed as part of pre-operative protocol coverage. Patient was last seen by Tommye Standard, PA-C, in 04/2020 at which time she was doing well from a cardiac standpoint. Patient was contacted today for further pre-op evaluation and reported doing well since last visit. No chest pain, significant shortness of breath, orthopnea, PND, worsening edema from baseline, palpitations, dizziness, or syncope. She does ambulate with a cane sometimes but able to complete >4.0 METS. Given past medical history and time since last visit, based on ACC/AHA guidelines, Samantha Stevenson would be at acceptable risk for the planned procedure without further cardiovascular testing.   Per Dr. Curt Bears and Pharmacy: Patient can hold Xarelto for 3 days and Aspirin for 5 days prior to procedure. Both should be restarted as soon as possible following procedure."  Anticipate pt can proceed with planned procedure barring acute status change.   VS: There were no vitals taken for this visit.  PROVIDERS: Hoyt Koch, MD is PCP    LABS: Labs reviewed: Acceptable for surgery. (all labs ordered are listed, but only abnormal results are displayed)  Labs Reviewed  CBC - Abnormal; Notable for the following components:      Result Value   RBC 3.71 (*)    Hemoglobin 10.1 (*)     HCT 33.9 (*)    MCHC 29.8 (*)    RDW 15.7 (*)    All other components within normal limits  BASIC METABOLIC PANEL     IMAGES:   EKG: 05/04/20 Rate 65 bpm   CV: Myocardial Perfusion 10/20/2019  Nuclear stress EF: 66%.  There was no ST segment deviation noted during stress.  No T wave inversion was noted during stress.  The study is normal.  This is a low risk study. No ischemia.  The left ventricular ejection fraction is hyperdynamic (>65%).   Echo 10/20/2019 IMPRESSIONS    1. Left ventricular ejection fraction, by visual estimation, is 60 to  65%. The left ventricle has normal function. There is no left ventricular  hypertrophy.  2. Elevated left ventricular end-diastolic pressure.  3. Left ventricular diastolic parameters are consistent with Grade I  diastolic dysfunction (impaired relaxation).  4. The left ventricle has no regional wall motion abnormalities.  5. Global right ventricle has normal systolic function.The right  ventricular size is normal. No increase in right ventricular wall  thickness.  6. Left atrial size was mildly dilated.  7. Right atrial size was normal.  8. The mitral valve is normal in structure. Trivial mitral valve  regurgitation. No evidence of mitral stenosis.  9. The tricuspid valve is normal in structure.  10. The aortic valve is normal in structure. Aortic valve regurgitation is  not visualized. No evidence of aortic valve sclerosis or stenosis.  11. The pulmonic valve was normal in structure. Pulmonic valve  regurgitation  is not visualized.  12. Normal pulmonary artery systolic pressure.  13. The tricuspid regurgitant velocity is 2.12 m/s, and with an assumed  right atrial pressure of 3 mmHg, the estimated right ventricular systolic  pressure is normal at 21.0 mmHg.  14. The inferior vena cava is normal in size with greater than 50%  respiratory variability, suggesting right atrial pressure of 3 mmHg.  Past Medical  History:  Diagnosis Date  . Allergy   . Anemia   . Arthritis    "qwhere" (07/29/2018)  . Atrial fibrillation ([place])   . CHF (congestive heart failure) ([place])   . Clotting disorder ([place])   . Degenerative arthritis of hip    s/p L THR 12/2012  . Dyspnea    since surgery in August 2019- "when I get worked up and walk a short distance"  . Dysrhythmia   . History of blood transfusion 05/2018   "related to OR"  . Hyperlipidemia   . Hypertension   . Intramural hematoma of thoracic aorta ([place]) 06/13/2018  . Migraines    mIgraines- none since blood pressure and lipids are under control  . Myocardial infarction ([place]) 2000   due to atrial fib  . Neuromuscular disorder ([place])    pinched nerve- left side of neck  . Pulmonary emboli ([place]) 12/2012 dx   post op (L THR), anticoag x 30mo    Past Surgical History:  Procedure Laterality Date  . ABDOMINAL HYSTERECTOMY     "w/1 tube"  . ANTERIOR CERVICAL DECOMPRESSION/DISCECTOMY FUSION 4 LEVELS N/A 02/02/2020   Procedure: CERVICAL THREE-FOUR, CERVICAL FOUR-FIVE, CERVICAL FIVE-SIX, CERVICAL SIX-SEVEN ANTERIOR CERVICAL DECOMPRESSION/DISCECTOMY FUSION;  Surgeon: Earnie Larsson, MD;  Location: [facility];  Service: Neurosurgery;  Laterality: N/A;  . ARTERY REPAIR Left 08/15/2018   Procedure: LEFT BRACHIAL ARTERY EXPLORATION;  Surgeon: Waynetta Sandy, MD;  Location: [facility];  Service: Vascular;  Laterality: Left;  . Breast Ultrasound Left 04/16/13   Done @ breast center Impression: no malignancy appearance noted on the screen study is consistent with a summation shadow  . CARDIAC CATHETERIZATION    . CAROTID-SUBCLAVIAN BYPASS GRAFT Left 06/13/2018   Procedure: BYPASS GRAFT CAROTID-SUBCLAVIAN;  Surgeon: Waynetta Sandy, MD;  Location: [facility];  Service: Vascular;  Laterality: Left;  . COLONOSCOPY    . DG TUMB RIGHT HAND Right    cyst removal  . IR THORACENTESIS ASP PLEURAL SPACE W/IMG GUIDE  05/31/2018  . JOINT REPLACEMENT    . THORACIC AORTIC  ENDOVASCULAR STENT GRAFT Left 06/13/2018   Procedure: LEFT  SUBCLAVIAN TO CAROTID ARTERY TRANSPOSITION; THORACIC AORTIC ENDOVASCULAR STENT GRAFT using GORE CONFORMABLE THORACIC STENT GRAFT AND GORE TAG THORACIC ENDOPROSTHESIS; STENT LEFT COMMON CAROTID ARTERY, RIGHT COMMON-FEMORAL ENDARTERECTOMY WITH PATCH ANGIOPLASTY;  Surgeon: Waynetta Sandy, MD;  Location: [facility];  Service: Vascular;  Laterality: Left;  . TONSILLECTOMY    . TOTAL HIP ARTHROPLASTY Left 01/03/2013   Procedure: TOTAL HIP ARTHROPLASTY ANTERIOR APPROACH;  Surgeon: Mcarthur Rossetti, MD;  Location: [facility];  Service: Orthopedics;  Laterality: Left;  Left Total Hip Arthroplasty, Anterior Approach  . TUBAL LIGATION    . VAGINA RECONSTRUCTION SURGERY  1966   vagina had closed up when 70 years old  . WOUND EXPLORATION Left 06/16/2018   Procedure: LEFT NECK WOUND EXPLORATION, CHEST TUBE INSERTION;  Surgeon: Waynetta Sandy, MD;  Location: [facility];  Service: Vascular;  Laterality: Left;    MEDICATIONS: . acetaminophen-codeine (TYLENOL #3) 300-30 MG tablet  . acetaminophen (TYLENOL) 500 MG tablet  .  aspirin EC 81 MG tablet  . diltiazem (DILACOR XR) 240 MG 24 hr capsule  . ferrous sulfate 325 (65 FE) MG tablet  . furosemide (LASIX) 20 MG tablet  . Multiple Vitamin (MULTIVITAMIN) tablet  . rivaroxaban (XARELTO) 20 MG TABS tablet  . rosuvastatin (CRESTOR) 20 MG tablet  . tiZANidine (ZANAFLEX) 4 MG tablet   No current facility-administered medications for this encounter.    Jodell Cipro, PA-C [facility] [phone]

## 2020-10-27 NOTE — Progress Notes (Signed)
Lab. Results: HGB: 10.1

## 2020-10-29 ENCOUNTER — Other Ambulatory Visit (HOSPITAL_COMMUNITY)
Admission: RE | Admit: 2020-10-29 | Discharge: 2020-10-29 | Disposition: A | Discharge status: Home / Self Care | Payer: Medicare Other | Source: Ambulatory Visit | Attending: Urology | Admitting: Urology

## 2020-10-29 DIAGNOSIS — Z20822 Contact with and (suspected) exposure to covid-19: Secondary | ICD-10-CM | POA: Diagnosis not present

## 2020-10-29 DIAGNOSIS — Z01812 Encounter for preprocedural laboratory examination: Secondary | ICD-10-CM | POA: Insufficient documentation

## 2020-10-29 LAB — SARS CORONAVIRUS 2 (TAT 6-24 HRS): SARS Coronavirus 2: NEGATIVE

## 2020-11-01 ENCOUNTER — Telehealth: Payer: Medicare Other

## 2020-11-02 ENCOUNTER — Ambulatory Visit (HOSPITAL_BASED_OUTPATIENT_CLINIC_OR_DEPARTMENT_OTHER): Payer: Medicare Other | Admitting: Physician Assistant

## 2020-11-02 ENCOUNTER — Other Ambulatory Visit: Payer: Self-pay

## 2020-11-02 ENCOUNTER — Encounter (HOSPITAL_BASED_OUTPATIENT_CLINIC_OR_DEPARTMENT_OTHER)
Admission: RE | Disposition: A | Discharge status: Home / Self Care | Payer: Self-pay | Source: Home / Self Care | Attending: Urology

## 2020-11-02 ENCOUNTER — Ambulatory Visit (HOSPITAL_BASED_OUTPATIENT_CLINIC_OR_DEPARTMENT_OTHER)
Admission: RE | Admit: 2020-11-02 | Discharge: 2020-11-02 | Disposition: A | Discharge status: Home / Self Care | Payer: Medicare Other | Attending: Urology | Admitting: Urology

## 2020-11-02 ENCOUNTER — Encounter (HOSPITAL_BASED_OUTPATIENT_CLINIC_OR_DEPARTMENT_OTHER): Payer: Self-pay | Admitting: Urology

## 2020-11-02 DIAGNOSIS — D649 Anemia, unspecified: Secondary | ICD-10-CM | POA: Diagnosis not present

## 2020-11-02 DIAGNOSIS — Z91040 Latex allergy status: Secondary | ICD-10-CM | POA: Insufficient documentation

## 2020-11-02 DIAGNOSIS — Z888 Allergy status to other drugs, medicaments and biological substances status: Secondary | ICD-10-CM | POA: Insufficient documentation

## 2020-11-02 DIAGNOSIS — I4891 Unspecified atrial fibrillation: Secondary | ICD-10-CM | POA: Diagnosis not present

## 2020-11-02 DIAGNOSIS — R310 Gross hematuria: Secondary | ICD-10-CM | POA: Diagnosis not present

## 2020-11-02 DIAGNOSIS — Z880 Allergy status to penicillin: Secondary | ICD-10-CM | POA: Insufficient documentation

## 2020-11-02 DIAGNOSIS — Z886 Allergy status to analgesic agent status: Secondary | ICD-10-CM | POA: Diagnosis not present

## 2020-11-02 DIAGNOSIS — E785 Hyperlipidemia, unspecified: Secondary | ICD-10-CM | POA: Diagnosis not present

## 2020-11-02 HISTORY — PX: CYSTOSCOPY WITH RETROGRADE PYELOGRAM, URETEROSCOPY AND STENT PLACEMENT: SHX5789

## 2020-11-02 SURGERY — CYSTOURETEROSCOPY, WITH RETROGRADE PYELOGRAM AND STENT INSERTION
Anesthesia: General | Site: Pelvis | Laterality: Bilateral

## 2020-11-02 MED ORDER — CHLORHEXIDINE GLUCONATE 0.12 % MT SOLN: 15.0000 mL | Freq: Once | OROMUCOSAL | Status: DC

## 2020-11-02 MED ORDER — ONDANSETRON HCL 4 MG/2ML IJ SOLN: 4.0000 mg | Freq: Once | INTRAMUSCULAR | Status: DC | PRN

## 2020-11-02 MED ORDER — CIPROFLOXACIN IN D5W 400 MG/200ML IV SOLN
INTRAVENOUS | Status: AC
  Filled 2020-11-02: qty 200

## 2020-11-02 MED ORDER — LACTATED RINGERS IV SOLN: INTRAVENOUS | Status: DC

## 2020-11-02 MED ORDER — OXYBUTYNIN CHLORIDE 5 MG PO TABS: 5.0000 mg | ORAL_TABLET | Freq: Three times a day (TID) | ORAL | 0 refills | Status: DC | PRN

## 2020-11-02 MED ORDER — PROPOFOL 10 MG/ML IV BOLUS
INTRAVENOUS | Status: AC
  Filled 2020-11-02: qty 20

## 2020-11-02 MED ORDER — LIDOCAINE HCL (CARDIAC) PF 100 MG/5ML IV SOSY
PREFILLED_SYRINGE | INTRAVENOUS | Status: DC | PRN
  Administered 2020-11-02: 60 mg via INTRAVENOUS

## 2020-11-02 MED ORDER — ONDANSETRON HCL 4 MG/2ML IJ SOLN
INTRAMUSCULAR | Status: AC
  Filled 2020-11-02: qty 2

## 2020-11-02 MED ORDER — DEXAMETHASONE SODIUM PHOSPHATE 4 MG/ML IJ SOLN
INTRAMUSCULAR | Status: DC | PRN
  Administered 2020-11-02: 10 mg via INTRAVENOUS

## 2020-11-02 MED ORDER — HYDROCODONE-ACETAMINOPHEN 5-325 MG PO TABS: 1.0000 | ORAL_TABLET | ORAL | 0 refills | Status: DC | PRN

## 2020-11-02 MED ORDER — KETOROLAC TROMETHAMINE 30 MG/ML IJ SOLN
INTRAMUSCULAR | Status: AC
  Filled 2020-11-02: qty 1

## 2020-11-02 MED ORDER — MIDAZOLAM HCL 5 MG/5ML IJ SOLN
INTRAMUSCULAR | Status: DC | PRN
  Administered 2020-11-02: 2 mg via INTRAVENOUS

## 2020-11-02 MED ORDER — ACETAMINOPHEN 500 MG PO TABS
ORAL_TABLET | ORAL | Status: AC
  Filled 2020-11-02: qty 2

## 2020-11-02 MED ORDER — DEXAMETHASONE SODIUM PHOSPHATE 10 MG/ML IJ SOLN
INTRAMUSCULAR | Status: AC
  Filled 2020-11-02: qty 1

## 2020-11-02 MED ORDER — ALFUZOSIN HCL ER 10 MG PO TB24: 10.0000 mg | ORAL_TABLET | Freq: Every day | ORAL | 0 refills | Status: DC

## 2020-11-02 MED ORDER — LIDOCAINE HCL (PF) 2 % IJ SOLN
INTRAMUSCULAR | Status: AC
  Filled 2020-11-02: qty 5

## 2020-11-02 MED ORDER — FENTANYL CITRATE (PF) 100 MCG/2ML IJ SOLN: 25.0000 ug | INTRAMUSCULAR | Status: DC | PRN

## 2020-11-02 MED ORDER — ORAL CARE MOUTH RINSE: 15.0000 mL | Freq: Once | OROMUCOSAL | Status: DC

## 2020-11-02 MED ORDER — SODIUM CHLORIDE 0.9 % IR SOLN
Status: DC | PRN
  Administered 2020-11-02: 3000 mL via INTRAVESICAL

## 2020-11-02 MED ORDER — PHENYLEPHRINE 40 MCG/ML (10ML) SYRINGE FOR IV PUSH (FOR BLOOD PRESSURE SUPPORT)
PREFILLED_SYRINGE | INTRAVENOUS | Status: AC
  Filled 2020-11-02: qty 10

## 2020-11-02 MED ORDER — FENTANYL CITRATE (PF) 100 MCG/2ML IJ SOLN
INTRAMUSCULAR | Status: DC | PRN
  Administered 2020-11-02 (×4): 25 ug via INTRAVENOUS
  Administered 2020-11-02: 50 ug via INTRAVENOUS
  Administered 2020-11-02 (×2): 25 ug via INTRAVENOUS

## 2020-11-02 MED ORDER — CIPROFLOXACIN IN D5W 400 MG/200ML IV SOLN
400.0000 mg | Freq: Once | INTRAVENOUS | Status: AC
  Administered 2020-11-02: 400 mg via INTRAVENOUS

## 2020-11-02 MED ORDER — FENTANYL CITRATE (PF) 100 MCG/2ML IJ SOLN
INTRAMUSCULAR | Status: AC
  Filled 2020-11-02: qty 2

## 2020-11-02 MED ORDER — EPHEDRINE SULFATE 50 MG/ML IJ SOLN
INTRAMUSCULAR | Status: DC | PRN
Start: 2020-11-02 — End: 2020-11-02
  Administered 2020-11-02: 20 mg via INTRAVENOUS

## 2020-11-02 MED ORDER — PROPOFOL 10 MG/ML IV BOLUS
INTRAVENOUS | Status: DC | PRN
  Administered 2020-11-02: 200 mg via INTRAVENOUS

## 2020-11-02 MED ORDER — ONDANSETRON HCL 4 MG/2ML IJ SOLN
INTRAMUSCULAR | Status: DC | PRN
  Administered 2020-11-02: 4 mg via INTRAVENOUS

## 2020-11-02 MED ORDER — PHENYLEPHRINE HCL (PRESSORS) 10 MG/ML IV SOLN
INTRAVENOUS | Status: DC | PRN
Start: 2020-11-02 — End: 2020-11-02
  Administered 2020-11-02 (×3): 80 ug via INTRAVENOUS

## 2020-11-02 MED ORDER — MIDAZOLAM HCL 2 MG/2ML IJ SOLN
INTRAMUSCULAR | Status: AC
  Filled 2020-11-02: qty 2

## 2020-11-02 MED ORDER — ACETAMINOPHEN 500 MG PO TABS
1000.0000 mg | ORAL_TABLET | Freq: Once | ORAL | Status: AC
  Administered 2020-11-02: 1000 mg via ORAL

## 2020-11-02 MED ORDER — IOHEXOL 300 MG/ML  SOLN
INTRAMUSCULAR | Status: DC | PRN
  Administered 2020-11-02: 20 mL via URETHRAL

## 2020-11-02 SURGICAL SUPPLY — 24 items
BAG DRAIN URO-CYSTO SKYTR STRL (DRAIN) ×4 IMPLANT
BAG DRN UROCATH (DRAIN) ×3
BASKET ZERO TIP NITINOL 2.4FR (BASKET) IMPLANT
BSKT STON RTRVL ZERO TP 2.4FR (BASKET)
CATH URET 5FR 28IN OPEN ENDED (CATHETERS) ×4 IMPLANT
CLOTH BEACON ORANGE TIMEOUT ST (SAFETY) ×4 IMPLANT
DRSG TEGADERM 4X4.75 (GAUZE/BANDAGES/DRESSINGS) IMPLANT
ELECT REM PT RETURN 9FT ADLT (ELECTROSURGICAL)
ELECTRODE REM PT RTRN 9FT ADLT (ELECTROSURGICAL) ×1 IMPLANT
EXTRACTOR STONE 1.7FRX115CM (UROLOGICAL SUPPLIES) IMPLANT
GLOVE SURG ENC MOIS LTX SZ6.5 (GLOVE) ×4 IMPLANT
GOWN STRL REUS W/TWL LRG LVL3 (GOWN DISPOSABLE) ×4 IMPLANT
GUIDEWIRE STR DUAL SENSOR (WIRE) ×10 IMPLANT
IV NS IRRIG 3000ML ARTHROMATIC (IV SOLUTION) ×4 IMPLANT
KIT TURNOVER CYSTO (KITS) ×4 IMPLANT
MANIFOLD NEPTUNE II (INSTRUMENTS) ×4 IMPLANT
NS IRRIG 500ML POUR BTL (IV SOLUTION) ×3 IMPLANT
PACK CYSTO (CUSTOM PROCEDURE TRAY) ×4 IMPLANT
SHEATH URETERAL 12FRX28CM (UROLOGICAL SUPPLIES) ×3 IMPLANT
STENT URET 6FRX24 CONTOUR (STENTS) ×6 IMPLANT
TRACTIP FLEXIVA PULS ID 200XHI (Laser) IMPLANT
TRACTIP FLEXIVA PULSE ID 200 (Laser)
TUBE CONNECTING 12X1/4 (SUCTIONS) ×4 IMPLANT
TUBING UROLOGY SET (TUBING) ×4 IMPLANT

## 2020-11-02 NOTE — Anesthesia Preprocedure Evaluation (Addendum)
Anesthesia Evaluation  Patient identified by MRN, date of birth, ID band Patient awake    Reviewed: Allergy & Precautions, NPO status , Patient's Chart, lab work & pertinent test results  Airway Mallampati: III  TM Distance: >3 FB Neck ROM: Full    Dental  (+) Upper Dentures, Partial Lower   Pulmonary PE   Pulmonary exam normal breath sounds clear to auscultation       Cardiovascular hypertension, + CAD, + Past MI and +CHF  Normal cardiovascular exam+ dysrhythmias Atrial Fibrillation  Rhythm:Regular Rate:Normal  ECG: rate 65   Neuro/Psych  Headaches, negative psych ROS   GI/Hepatic negative GI ROS, Neg liver ROS,   Endo/Other  negative endocrine ROS  Renal/GU negative Renal ROS     Musculoskeletal  (+) Arthritis ,   Abdominal   Peds  Hematology  (+) anemia , HLD   Anesthesia Other Findings GROSS HEMATURIA  Reproductive/Obstetrics                            Anesthesia Physical Anesthesia Plan  ASA: III  Anesthesia Plan: General   Post-op Pain Management:    Induction: Intravenous  PONV Risk Score and Plan: 4 or greater and Ondansetron, Dexamethasone, Midazolam and Treatment may vary due to age or medical condition  Airway Management Planned: LMA  Additional Equipment:   Intra-op Plan:   Post-operative Plan: Extubation in OR  Informed Consent: I have reviewed the patients History and Physical, chart, labs and discussed the procedure including the risks, benefits and alternatives for the proposed anesthesia with the patient or authorized representative who has indicated his/her understanding and acceptance.     Dental advisory given  Plan Discussed with: CRNA  Anesthesia Plan Comments:        Anesthesia Quick Evaluation

## 2020-11-02 NOTE — Discharge Instructions (Signed)
,Post stent placement instructions   Definitions:  Ureter: The duct that transports urine from the kidney to the bladder. Stent: A plastic hollow tube that is placed into the ureter, from the kidney to the bladder to prevent the ureter from swelling shut.  General instructions:  Despite the fact that no skin incisions were used, the area around the ureter and bladder is raw and irritated. The stent is a foreign body which can further irritate the bladder wall. This irritation is manifested by increased frequency of urination, both day and night, and by an increase in the urge to urinate. In some, the urge to urinate is present almost always. Sometimes the urge is strong enough that you may not be able to stop your self from urinating. This can often be controlled with medication but does not occur in everyone. A stent can safely be left in place for 3 months or greater.  You may see some blood in your urine while the stent is in place and a few days afterward. Do not be alarmed, even if the urine is clear for a while. Get off your feet and drink lots of fluids until clearing occurs. If you start to pass clots or don't improve, call us.  Diet:  You may return to your normal diet immediately. Because of the raw surface of your bladder, alcohol, spicy foods, foods high in acid and drinks with caffeine may cause irritation or frequency and should be used in moderation. To keep your urine flowing freely and avoid constipation, drink plenty of fluids during the day (8-10 glasses). Tip: Avoid cranberry juice because it is very acidic.  Activity:  Your physical activity doesn't need to be restricted. However, if you are very active, you may see some blood in the urine. We suggest that you reduce your activity under the circumstances until the bleeding has stopped.  Bowels:  It is important to keep your bowels regular during the postoperative period. Straining with bowel movements can cause bleeding.  A bowel movement every other day is reasonable. Use a mild laxative if needed, such as milk of magnesia 2-3 tablespoons, or 2 Dulcolax tablets. Call if you continue to have problems. If you had been taking narcotics for pain, before, during or after your surgery, you may be constipated. Take a laxative if necessary.  Medication:  You should resume your pre-surgery medications unless told not to. In addition you may be given an antibiotic to prevent or treat infection. Antibiotics are not always necessary. All medication should be taken as prescribed until the bottles are finished unless you are having an unusual reaction to one of the drugs.  Problems you should report to [place]:  a. Fever greater than 101F. b. Heavy bleeding, or clots (see notes above about blood in urine). c. Inability to urinate. d. Drug reactions (hives, rash, nausea, vomiting, diarrhea). e. Severe burning or pain with urination that is not improving.    Post Anesthesia Home Care Instructions  Activity: Get plenty of rest for the remainder of the day. A responsible individual must stay with you for 24 hours following the procedure.  For the next 24 hours, DO NOT: -Drive a car -Paediatric nurse -Drink alcoholic beverages -Take any medication unless instructed by your physician -Make any legal decisions or sign important papers.  Meals: Start with liquid foods such as gelatin or soup. Progress to regular foods as tolerated. Avoid greasy, spicy, heavy foods. If nausea and/or vomiting occur, drink only clear liquids until the  nausea and/or vomiting subsides. Call your physician if vomiting continues.  Special Instructions/Symptoms: Your throat may feel dry or sore from the anesthesia or the breathing tube placed in your throat during surgery. If this causes discomfort, gargle with warm salt water. The discomfort should disappear within 24 hours.  If you had a scopolamine patch placed behind your ear for the management  of post- operative nausea and/or vomiting:  1. The medication in the patch is effective for 72 hours, after which it should be removed.  Wrap patch in a tissue and discard in the trash. Wash hands thoroughly with soap and water. 2. You may remove the patch earlier than 72 hours if you experience unpleasant side effects which may include dry mouth, dizziness or visual disturbances. 3. Avoid touching the patch. Wash your hands with soap and water after contact with the patch.

## 2020-11-02 NOTE — Transfer of Care (Signed)
Immediate Anesthesia Transfer of Care Note  Patient: Samantha Stevenson  Procedure(s) Performed: Procedure(s) (LRB): DIAGNOSTIC CYSTOSCOPY WITH RETROGRADE PYELOGRAM,BILATERAL DIAGNOSTIC URETEROSCOPY  AND BILATERAL STENT PLACEMENT (Bilateral)  Patient Location: PACU  Anesthesia Type: General  Level of Consciousness: awake, sedated, patient cooperative and responds to stimulation  Airway & Oxygen Therapy: Patient Spontanous Breathing and Patient connected to [place] 02 and soft FM   Post-op Assessment: Report given to PACU RN, Post -op Vital signs reviewed and stable and Patient moving all extremities  Post vital signs: Reviewed and stable  Complications: No apparent anesthesia complications

## 2020-11-02 NOTE — H&P (Signed)
CC/HPI: cc: gross hematuria, urethral caruncle    09/23/19: 70 yo woman with one episode of gross hematuria 4 months ago. Patient was on plavix and xorelto at the time. She is no longer on plavix but remains on xorelto. She has experienced two UTIs in her lifetime. Patient denies any voiding dysfunction, frequent UTIs history of kidney stones or family history of kidney/bladder cancer. She has never used tobacco products. She had a CT scan of the abdomen pelvis in July that did not show any kidney stones.   09/30/19: 70 year old woman with single episode of gross hematuria here for cystoscopy. CT scan of the abdomen pelvis within normal limits.   04/05/20: 70 year old woman initially seen for gross hematuria and had a negative workup. She remains on Xarelto. At her last visit she had persistent microscopic hematuria and proteinuria and was referred to nephrology. Patient was seen by the nephrologists and did not have microscopic hematuria anymore but did have persistent proteinuria. She is to follow-up with them again in a few months. Patient is asymptomatic today and denies any voiding dysfunction, UTIs or further gross hematuria. She is using estrogen cream twice a week for urethral caruncle. She is not bothered by this.   08/03/2020: 70 year old woman who is been evaluated for gross hematuria 10 months ago with a negative workup. She is currently taking Xarelto. Presents today with severe suprapubic discomfort low back pain and gross hematuria. This began 4 days ago. She denies fevers, chills.. She endorses suprapubic pain and pressure, burning pain with urination, and denies blood clots in her urine. Her urinalysis today with packed red blood cells however no other indicators for infectious process.   09/09/20: 70 year old woman with a history of a urethral caruncle and an negative gross hematuria workup in December 2020 returns after several days of recurrent gross hematuria. Patient denies any back  pain associated with the hematuria. Urinalysis was negative for infection during this time. It has since subsided. Renal ultrasound at that time showed mobile debris in the bladder consistent with clot. Urinalysis today consistent with microscopic hematuria 3-10 RBCs per high-powered field.   10/26/20: The patient with above history. She returns today for preoperative appointment. She was scheduled for cystoscopy, diagnostic ureteroscopy, possible bladder biopsy, possible ureteral biopsy, and possible ureteral stent placement on 1/11 given persistent episodes of intermittent gross hematuria. We did received cardiac clearance for her hold her Xarelto 72 hours prior to her procedure. She denies any interval episodes of gross hematuria since prior office visit. She denies current dysuria and feels she is voiding at baseline. At baseline she does have some frequency and urgency. She denies any unilateral flank pain. No complaints of fever, chills, nausea, or vomiting. She denies any recent episodes of chest pain, shortness of breath, dizziness, or lightheadedness.     ALLERGIES: All Cillins Aspirin Banana Celebrex Dairy Products Kiwi latex penicillin    MEDICATIONS: Digoxin  Diltiazem 24Hr Er (Cd) 240 mg capsule, ext release 24 hr  Gabapentin  Iron 325 mg (65 mg iron) tablet  Multivitamin  Rosuvastatin Calcium 20 mg tablet  Tylenol  Xarelto 20 mg tablet     GU PSH: Cystoscopy - 09/30/2019 Locm 300-399Mg /Ml Iodine,1Ml - 09/25/2019     NON-GU PSH: Neck Surgery (Unspecified)     GU PMH: Gross hematuria, Given the recurrent gross hematuria we have decided to proceed with cystoscopy, possible bladder biopsy, diagnostic bilateral retrograde pyelogram and ureteroscopy with possible biopsy, and stent placement. We discussed the risks and benefits of  the procedure including but not limited to pain, stent discomfort, infection, bleeding, need for future procedure, inability to examine kidneys on 1st  attempt, damage to surrounding structures. Patient is anxious about having recurrent blood and would like to rule everything out. She has been to nephrology for evaluation previously. Her urinalysis today does still have microscopic hematuria. I will also send her urine for cytology. - 09/09/2020, - 08/03/2020 (Stable), No source of hematuria identified on CT scan or cystoscopy. Patient does have 3+ protein in urine along with persistent microscopic hematuria. If this persists then nephrology referral may be indicated., - 09/30/2019 (Stable, Acute), Discussed patient's episode of gross hematuria in the recommended workup which includes a cystoscopy and CT urogram. While the patient was on anticoagulation we cannot attribute the hematuria to this without investigating the GU tract. Patient will be scheduled for CT scan followed by cystoscopy., - 09/23/2019 Microscopic hematuria - 09/09/2020, - 11/25/2019 Persistent proteinuria, Unspec - 11/25/2019 Urethral caruncle, Patient has a mild urethral caruncle on physical exam today. I will prescribe topical estrogen cream to be applied weekly for 7 days and then twice a week until follow-up. I will reassess in 8 weeks. - 09/30/2019    NON-GU PMH: None   FAMILY HISTORY: None   SOCIAL HISTORY: Marital Status: Single Preferred Language: English; Ethnicity: Not Hispanic Or Latino; Race: Black or African American Current Smoking Status: Patient has never smoked.   Tobacco Use Assessment Completed: Used Tobacco in last 30 days? Has never drank.  Does not drink caffeine.    REVIEW OF SYSTEMS:    GU Review Female:   Patient denies frequent urination, hard to postpone urination, burning /pain with urination, get up at night to urinate, leakage of urine, stream starts and stops, trouble starting your stream, have to strain to urinate, and being pregnant.  Gastrointestinal (Upper):   Patient denies nausea, vomiting, and indigestion/ heartburn.  Gastrointestinal (Lower):    Patient denies diarrhea and constipation.  Constitutional:   Patient denies fever, night sweats, weight loss, and fatigue.  Skin:   Patient denies skin rash/ lesion and itching.  Eyes:   Patient denies blurred vision and double vision.  Ears/ Nose/ Throat:   Patient denies sore throat and sinus problems.  Hematologic/Lymphatic:   Patient denies swollen glands and easy bruising.  Cardiovascular:   Patient denies leg swelling and chest pains.  Respiratory:   Patient denies cough and shortness of breath.  Endocrine:   Patient denies excessive thirst.  Musculoskeletal:   Patient denies back pain and joint pain.  Neurological:   Patient denies dizziness and headaches.  Psychologic:   Patient denies depression and anxiety.   Notes: NO symptoms today    VITAL SIGNS:      10/26/2020 10:13 AM  BP 143/81 mmHg  Heart Rate 81 /min  Temperature 98.0 F / 36.6 C   GU PHYSICAL EXAMINATION:      Notes: No CVA tenderness bilaterally.   MULTI-SYSTEM PHYSICAL EXAMINATION:    Constitutional: Well-nourished. No physical deformities. Normally developed. Good grooming.  Respiratory: Normal breath sounds. No labored breathing, no use of accessory muscles.   Cardiovascular: Regular rate and rhythm. No murmur, no gallop. Normal temperature, normal extremity pulses, no swelling, no varicosities.   Skin: No paleness, no jaundice, no cyanosis. No lesion, no ulcer, no rash.  Neurologic / Psychiatric: Oriented to time, oriented to place, oriented to person. No depression, no anxiety, no agitation.  Gastrointestinal: No mass, no tenderness, no rigidity, non obese abdomen.  Musculoskeletal: Normal gait and station of head and neck.     Complexity of Data:  Records Review:   Previous Patient Records  Urine Test Review:   Urinalysis, Urine Culture  X-Ray Review: C.T. Hematuria: Reviewed Report.     PROCEDURES:          Urinalysis Dipstick Dipstick Cont'd  Color: Yellow Bilirubin: Neg mg/dL  Appearance:  Clear Ketones: Neg mg/dL  Specific Gravity: 1.025 Blood: Neg ery/uL  pH: 5.5 Protein: Trace mg/dL  Glucose: Neg mg/dL Urobilinogen: 0.2 mg/dL    Nitrites: Neg    Leukocyte Esterase: Neg leu/uL    ASSESSMENT:      ICD-10 Details  1 GU:   Gross hematuria - R31.0    PLAN:           Orders Labs Urine Culture          Document Letter(s):  Created for Patient: Clinical Summary         Notes:   Urinalysis today is without infectious parameters or hematuria, but precautionary culture was sent given upcoming procedure. If positive, I will plan to prescribe accordingly to cover her for upcoming surgery. We reviewed upcoming surgical procedure in detail today. I answered all of her questions to the best of my ability. Preoperative instructions were reviewed. She will keep the appointment for surgery on 01/11, as planned. Return precautions were discussed in the interim.

## 2020-11-02 NOTE — Anesthesia Procedure Notes (Signed)
Procedure Name: LMA Insertion Date/Time: 11/02/2020 11:45 AM Performed by: Justice Rocher, CRNA Pre-anesthesia Checklist: Patient identified, Emergency Drugs available, Suction available, Patient being monitored and Timeout performed Patient Re-evaluated:Patient Re-evaluated prior to induction Oxygen Delivery Method: Circle system utilized Preoxygenation: Pre-oxygenation with 100% oxygen Induction Type: IV induction Ventilation: Mask ventilation without difficulty LMA: LMA inserted LMA Size: 4.0 Number of attempts: 1 Airway Equipment and Method: Bite block Placement Confirmation: positive ETCO2,  breath sounds checked- equal and bilateral and CO2 detector Tube secured with: Tape Dental Injury: Teeth and Oropharynx as per pre-operative assessment

## 2020-11-02 NOTE — Anesthesia Postprocedure Evaluation (Signed)
Anesthesia Post Note  Patient: Samantha Stevenson  Procedure(s) Performed: DIAGNOSTIC CYSTOSCOPY WITH RETROGRADE PYELOGRAM,BILATERAL DIAGNOSTIC URETEROSCOPY  AND BILATERAL STENT PLACEMENT (Bilateral Pelvis)     Patient location during evaluation: PACU Anesthesia Type: General Level of consciousness: awake Pain management: pain level controlled Vital Signs Assessment: post-procedure vital signs reviewed and stable Respiratory status: spontaneous breathing, nonlabored ventilation, respiratory function stable and patient connected to nasal cannula oxygen Cardiovascular status: blood pressure returned to baseline and stable Postop Assessment: no apparent nausea or vomiting Anesthetic complications: no   No complications documented.  Last Vitals:  Vitals:   11/02/20 1300 11/02/20 1350  BP: 116/63 119/71  Pulse: 81 73  Resp: 13 16  Temp:  36.4 C  SpO2: 99% 97%    Last Pain:  Vitals:   11/02/20 1350  TempSrc: Oral  PainSc:                  Kendan Cornforth P Faelyn Sigler

## 2020-11-02 NOTE — Interval H&P Note (Signed)
History and Physical Interval Note:  11/02/2020 11:12 AM  Samantha Stevenson  has presented today for surgery, with the diagnosis of GROSS HEMATURIA.  The various methods of treatment have been discussed with the patient and family. After consideration of risks, benefits and other options for treatment, the patient has consented to  Procedure(s) with comments: CYSTOSCOPY WITH RETROGRADE PYELOGRAM, URETEROSCOPY WITH POSSIBLE BIOPSY AND STENT PLACEMENT (Bilateral) - 90 MINS HOLMIUM LASER APPLICATION (Bilateral) CYSTOSCOPY WITH BIOPSY (N/A) as a surgical intervention.  The patient's history has been reviewed, patient examined, no change in status, stable for surgery.  I have reviewed the patient's chart and labs.  Questions were answered to the patient's satisfaction.     Raahim Shartzer D Maday Guarino

## 2020-11-02 NOTE — Op Note (Signed)
Preoperative diagnosis: recurrent gross hematuria  Postoperative diagnosis: recurrent gross hematuria Procedure:  1. Cystoscopy 2. bilateral diagnostic semirigid ureteroscopy and flexible ureteroscopy 3. bilateral 67F x 24cm ureteral stent placement  4. bilateral retrograde pyelography with interpretation  Surgeon: Jacalyn Lefevre, MD  Anesthesia: General  Complications: None  Intraoperative findings:  1.  Normal urethra 2.  Normal cystoscopy with bilateral orthotopic ureteral orifices, no bladder masses or lesions 3.  Left retrograde pyelogram without filling defects however contrast retained in collecting system for majority of case and on diagnostic ureteroscopy narrow left UPJ; unable to traverse flexible ureteroscope past UPJ 4.  Right retrograde pyelogram without filling defects and brisk drainage of contrast 5.  Right diagnostic ureteroscopy without abnormality or masses 6.  6 French by 24 cm JJ bilateral ureteral stents without tether  EBL: Minimal  Specimens: 1. none  Disposition of specimens: [facility] for stone analysis  Indication: Abena Erdman is a 70 y.o.   patient with recurrent gross hematuria.  After reviewing the management options for treatment, the patient elected to proceed with the above surgical procedure(s). We have discussed the potential benefits and risks of the procedure, side effects of the proposed treatment, the likelihood of the patient achieving the goals of the procedure, and any potential problems that might occur during the procedure or recuperation. Informed consent has been obtained.   Description of procedure:  The patient was taken to the operating room and general anesthesia was induced.  The patient was placed in the dorsal lithotomy position, prepped and draped in the usual sterile fashion, and preoperative antibiotics were administered. A preoperative time-out was performed.   Cystourethroscopy was performed.   The patient's urethra was examined and was normal. The bladder was then systematically examined in its entirety. There was no evidence for any bladder tumors, stones, or other mucosal pathology.    Attention then turned to the left ureteral orifice and a ureteral catheter was used to intubate the ureteral orifice.  Omnipaque contrast was injected through the ureteral catheter and a retrograde pyelogram was performed with findings as dictated above.  A 0.38 sensor guidewire was then advanced up the left ureter into the renal pelvis under fluoroscopic guidance. The 4.5 Fr semirigid ureteroscope was then advanced into the ureter next to the guidewire to the level of the UPJ.  No abnormality was noted.  A second wire was then placed with the ureteroscope and the ureteroscope was removed.  A ureteral access sheath was then placed over the second wire and advanced to the proximal ureter with fluoroscopic guidance.  The inner wire and sheath were removed.  Next flexible ureteroscopy took place to the level of the UPJ where narrowing was noted.  Attempts at advancing the flexible ureteroscope were unsuccessful and there was a ureteral defect noted at this time.  The ureteroscope was then removed and used to need with the ureteral access sheath taking care to avoid any further trauma.  A 6 French by 24 cm double-J ureteral stent was then placed with fluoroscopic guidance as well as cystoscopic guidance.  Proximal curl was seen in the upper pole of the kidney with distal curl seen in the bladder.  There was a small amount of extravasation of contrast at the UPJ however contrast was seen in the bladder from the stent.  Next our attention turned to the right side where the retrograde pyelogram, semirigid ureteroscopy and flexible ureteroscopy then took place in a similar manner.  Flexible ureteroscopy was able to examine  collecting system and no abnormalities were noted.  The ureteral access sheath was then removed and  using with the flexible ureteroscope and no injury to the ureter was noted.  A 6 [place] by 24 cm JJ ureteral stent was then placed with fluoroscopic and scopic guidance in standard fashion.  The patient's bladder was decompressed.  She was awakened from anesthesia and transferred to the PACU in stable condition.    Disposition: We will plan on repeat diagnostic left uteroscopy in approximately 2 to 3 weeks.

## 2020-11-03 ENCOUNTER — Encounter (HOSPITAL_BASED_OUTPATIENT_CLINIC_OR_DEPARTMENT_OTHER): Payer: Self-pay | Admitting: Urology

## 2020-11-04 DIAGNOSIS — I700 Atherosclerosis of aorta: Secondary | ICD-10-CM | POA: Diagnosis not present

## 2020-11-04 DIAGNOSIS — R1084 Generalized abdominal pain: Secondary | ICD-10-CM | POA: Diagnosis not present

## 2020-11-04 DIAGNOSIS — M47816 Spondylosis without myelopathy or radiculopathy, lumbar region: Secondary | ICD-10-CM | POA: Diagnosis not present

## 2020-11-04 DIAGNOSIS — K5730 Diverticulosis of large intestine without perforation or abscess without bleeding: Secondary | ICD-10-CM | POA: Diagnosis not present

## 2020-11-04 DIAGNOSIS — N1330 Unspecified hydronephrosis: Secondary | ICD-10-CM | POA: Diagnosis not present

## 2020-11-09 DIAGNOSIS — R310 Gross hematuria: Secondary | ICD-10-CM | POA: Diagnosis not present

## 2020-11-14 ENCOUNTER — Ambulatory Visit
Admission: RE | Admit: 2020-11-14 | Discharge: 2020-11-14 | Disposition: A | Discharge status: Home / Self Care | Payer: Medicare Other | Source: Ambulatory Visit | Attending: Orthopaedic Surgery | Admitting: Orthopaedic Surgery

## 2020-11-14 ENCOUNTER — Other Ambulatory Visit: Payer: Self-pay

## 2020-11-14 DIAGNOSIS — M25562 Pain in left knee: Secondary | ICD-10-CM | POA: Diagnosis not present

## 2020-11-14 DIAGNOSIS — M7989 Other specified soft tissue disorders: Secondary | ICD-10-CM

## 2020-11-16 ENCOUNTER — Ambulatory Visit (INDEPENDENT_AMBULATORY_CARE_PROVIDER_SITE_OTHER): Payer: Medicare Other | Admitting: Orthopaedic Surgery

## 2020-11-16 ENCOUNTER — Encounter: Payer: Self-pay | Admitting: Orthopaedic Surgery

## 2020-11-16 DIAGNOSIS — M25562 Pain in left knee: Secondary | ICD-10-CM

## 2020-11-16 DIAGNOSIS — S83282D Other tear of lateral meniscus, current injury, left knee, subsequent encounter: Secondary | ICD-10-CM | POA: Diagnosis not present

## 2020-11-16 NOTE — Progress Notes (Signed)
The patient comes in today to go over MRI of her left hip and her left knee.  She had been walking with a cane and had acute left knee pain but also some hip pain.  It was really hard to differentiate what was the source of her pain.  It seems to be more of her left knee.  I did replace her left hip remotely.  Examination of her left hip shows fluid and full range of motion of the left hip.  Examination of her left knee shows slight valgus malalignment with significant lateral joint line tenderness.  There is a positive Murray sign to the lateral aspect of her knee.  The MRI of her left hip shows no worrisome features with a hip replacement itself.  There is no edema around the bone or fluid collections.  The tendons and muscle structures are all intact.  The MRI of her left knee does show an anterior horn to body meniscal tear with some extrusion of meniscal fragments.  There is only thinning of the cartilage on the lateral aspect of the knee.  The medial side is normal.  There is some patellofemoral arthritic changes.  It does seem that her left knee lateral meniscal tear is a source of her mechanical symptoms and pain.  At this point she has tried total forms of conservative treatment including activity modification and quad strengthening exercises.  We have injected the knee as well and that did temporize her symptoms but she still having locking and catching.  I will have her continue to use her cane when she mobilizes.  I have recommended an arthroscopic intervention for the left knee.  I explained in detail what this involves including a description of the risk and benefits of surgery.  She is on Xarelto and will stop it 3 days prior to her knee scope.  She does wish to proceed with the surgery given her continued symptoms.  All question concerns were answered and addressed.  We will work on getting this scheduled.

## 2020-11-22 ENCOUNTER — Other Ambulatory Visit: Payer: Self-pay

## 2020-11-22 DIAGNOSIS — K5730 Diverticulosis of large intestine without perforation or abscess without bleeding: Secondary | ICD-10-CM | POA: Diagnosis not present

## 2020-11-22 DIAGNOSIS — R1084 Generalized abdominal pain: Secondary | ICD-10-CM | POA: Diagnosis not present

## 2020-11-22 DIAGNOSIS — R8271 Bacteriuria: Secondary | ICD-10-CM | POA: Diagnosis not present

## 2020-11-22 DIAGNOSIS — R310 Gross hematuria: Secondary | ICD-10-CM | POA: Diagnosis not present

## 2020-11-22 LAB — CBC AND DIFFERENTIAL
HCT: 21 — AB (ref 36–46)
Hemoglobin: 6.3 — AB (ref 12.0–16.0)

## 2020-11-22 NOTE — Progress Notes (Signed)
Samantha Stevenson - 70 y.o. female MRN [id number]  Date of birth: [birth year]  Office Visit Note: Visit Date: 09/14/2020 PCP: Hoyt Koch, MD Referred by: Hoyt Koch, *  Subjective: Chief Complaint  Patient presents with  . Lower Back - Pain  . Left Leg - Pain   HPI:  Samantha Stevenson is a 70 y.o. female who comes in today at the request of Dr. Jean Rosenthal for planned Left L3-L4 Lumbar epidural steroid injection with fluoroscopic guidance.  The patient has failed conservative care including home exercise, medications, time and activity modification.  This injection will be diagnostic and hopefully therapeutic.  Please see requesting physician notes for further details and justification.  MRI reviewed with images and spine model.  MRI reviewed in the note below.  Depending on relief would recommend facet joint blocks diagnostically with goal towards radiofrequency ablation for more axial back pain.  ROS Otherwise per HPI.  Assessment & Plan: Visit Diagnoses:    ICD-10-CM   1. Lumbar radiculopathy  M54.16 XR C-ARM NO REPORT    Epidural Steroid injection    methylPREDNISolone acetate (DEPO-MEDROL) injection 80 mg    Plan: No additional findings.   Meds & Orders:  Meds ordered this encounter  Medications  . methylPREDNISolone acetate (DEPO-MEDROL) injection 80 mg    Orders Placed This Encounter  Procedures  . XR C-ARM NO REPORT  . Epidural Steroid injection    Follow-up: Return if symptoms worsen or fail to improve.   Procedures: No procedures performed  Lumbosacral Transforaminal Epidural Steroid Injection - Sub-Pedicular Approach with Fluoroscopic Guidance  Patient: Samantha Stevenson      Date of Birth: 1951-10-21 MRN: [id number] PCP: Hoyt Koch, MD      Visit Date: 09/14/2020   Universal Protocol:    Date/Time: 09/14/2020  Consent Given By: the patient  Position: PRONE  Additional Comments: Vital signs were monitored  before and after the procedure. Patient was prepped and draped in the usual sterile fashion. The correct patient, procedure, and site was verified.   Injection Procedure Details:   Procedure diagnoses: Lumbar radiculopathy [M54.16]    Meds Administered:  Meds ordered this encounter  Medications  . methylPREDNISolone acetate (DEPO-MEDROL) injection 80 mg    Laterality: Left  Location/Site:  L3-L4  Needle:5.0 in., 22 ga.  Short bevel or Quincke spinal needle  Needle Placement: Transforaminal  Findings:    -Comments: Excellent flow of contrast along the nerve, nerve root and into the epidural space.  Initial flow was very stenotic flow at this level  Procedure Details: After squaring off the end-plates to get a true AP view, the C-arm was positioned so that an oblique view of the foramen as noted above was visualized. The target area is just inferior to the "nose of the scotty dog" or sub pedicular. The soft tissues overlying this structure were infiltrated with 2-3 ml. of 1% Lidocaine without Epinephrine.  The spinal needle was inserted toward the target using a "trajectory" view along the fluoroscope beam.  Under AP and lateral visualization, the needle was advanced so it did not puncture dura and was located close the 6 O'Clock position of the pedical in AP tracterory. Biplanar projections were used to confirm position. Aspiration was confirmed to be negative for CSF and/or blood. A 1-2 ml. volume of Isovue-250 was injected and flow of contrast was noted at each level. Radiographs were obtained for documentation purposes.   After attaining the desired flow of contrast documented above, a 0.5  to 1.0 ml test dose of 0.25% Marcaine was injected into each respective transforaminal space.  The patient was observed for 90 seconds post injection.  After no sensory deficits were reported, and normal lower extremity motor function was noted,   the above injectate was administered so that  equal amounts of the injectate were placed at each foramen (level) into the transforaminal epidural space.   Additional Comments:  The patient tolerated the procedure well Dressing: 2 x 2 sterile gauze and Band-Aid    Post-procedure details: Patient was observed during the procedure. Post-procedure instructions were reviewed.  Patient left the clinic in stable condition.      Clinical History: MRI LUMBAR SPINE WITHOUT CONTRAST  TECHNIQUE: Multiplanar, multisequence MR imaging of the lumbar spine was performed. No intravenous contrast was administered.  COMPARISON:  09/23/2016  FINDINGS: Segmentation:  5 lumbar type vertebrae  Alignment: Levoscoliosis and L3-4, L4-5 degenerative grade 1 anterolisthesis.  Vertebrae:  No fracture, evidence of discitis, or bone lesion.  Conus medullaris and cauda equina: Conus extends to the L1-2 level. Conus and cauda equina appear normal.  Paraspinal and other soft tissues: Generous diameter aorta at the hiatus. Patient has been stent grafted with recent CTA 04/30/2020  Disc levels:  T12- L1: Disc narrowing with ventral spurring.  No impingement  L1-L2: Disc narrowing and bulging with asymmetric right facet spurring. Mild or moderate right foraminal narrowing.  L2-L3: Facet degeneration with spurring on both sides. Mild disc narrowing and bulging.  L3-L4: Moderate degenerative facet spurring. Mild anterolisthesis. The disc is narrowed and bulging there is left foraminal narrowing with mild nerve root flattening on sagittal images. Patent canal.  L4-L5: Degenerative facet spurring on both sides. Mild disc narrowing and bulging. No neural compression  L5-S1:Mild disc narrowing and bulging. Mild degenerative facet spurring. The canal and foramina are patent  IMPRESSION: 1. Multilevel disc and facet degeneration with scoliosis and L3-4, L4-5 listhesis. No focal or definite progression since 2017. 2. Mild to  moderate foraminal narrowing on the right at L1-2 and left at L3-4. 3. Diffusely patent spinal canal.   Electronically Signed   By: Monte Fantasia M.D.   On: 08/15/2020 09:12     Objective:  VS:  HT:    WT:   BMI:     BP:122/67  HR:76bpm  TEMP: ( )  RESP:  Physical Exam Vitals and nursing note reviewed.  Constitutional:      General: She is not in acute distress.    Appearance: Normal appearance. She is not ill-appearing.  HENT:     Head: Normocephalic and atraumatic.     Right Ear: External ear normal.     Left Ear: External ear normal.  Eyes:     Extraocular Movements: Extraocular movements intact.  Cardiovascular:     Rate and Rhythm: Normal rate.     Pulses: Normal pulses.  Pulmonary:     Effort: Pulmonary effort is normal. No respiratory distress.  Abdominal:     General: There is no distension.     Palpations: Abdomen is soft.  Musculoskeletal:        General: Tenderness present.     Cervical back: Neck supple.     Right lower leg: No edema.     Left lower leg: No edema.     Comments: Patient has good distal strength with no pain over the greater trochanters.  No clonus or focal weakness. Patient somewhat slow to rise from a seated position to full extension.  There is  concordant low back pain with facet loading and lumbar spine extension rotation.  There are no definitive trigger points but the patient is somewhat tender across the lower back and PSIS.  There is no pain with hip rotation.   Skin:    Findings: No erythema, lesion or rash.  Neurological:     General: No focal deficit present.     Mental Status: She is alert and oriented to person, place, and time.     Sensory: No sensory deficit.     Motor: No weakness or abnormal muscle tone.     Coordination: Coordination normal.  Psychiatric:        Mood and Affect: Mood normal.        Behavior: Behavior normal.      Imaging: No results found.

## 2020-11-22 NOTE — Procedures (Signed)
Lumbosacral Transforaminal Epidural Steroid Injection - Sub-Pedicular Approach with Fluoroscopic Guidance  Patient: Samantha Stevenson      Date of Birth: 12-31-1950 MRN: [id number] PCP: Hoyt Koch, MD      Visit Date: 09/14/2020   Universal Protocol:    Date/Time: 09/14/2020  Consent Given By: the patient  Position: PRONE  Additional Comments: Vital signs were monitored before and after the procedure. Patient was prepped and draped in the usual sterile fashion. The correct patient, procedure, and site was verified.   Injection Procedure Details:   Procedure diagnoses: Lumbar radiculopathy [M54.16]    Meds Administered:  Meds ordered this encounter  Medications  . methylPREDNISolone acetate (DEPO-MEDROL) injection 80 mg    Laterality: Left  Location/Site:  L3-L4  Needle:5.0 in., 22 ga.  Short bevel or Quincke spinal needle  Needle Placement: Transforaminal  Findings:    -Comments: Excellent flow of contrast along the nerve, nerve root and into the epidural space.  Initial flow was very stenotic flow at this level  Procedure Details: After squaring off the end-plates to get a true AP view, the C-arm was positioned so that an oblique view of the foramen as noted above was visualized. The target area is just inferior to the "nose of the scotty dog" or sub pedicular. The soft tissues overlying this structure were infiltrated with 2-3 ml. of 1% Lidocaine without Epinephrine.  The spinal needle was inserted toward the target using a "trajectory" view along the fluoroscope beam.  Under AP and lateral visualization, the needle was advanced so it did not puncture dura and was located close the 6 O'Clock position of the pedical in AP tracterory. Biplanar projections were used to confirm position. Aspiration was confirmed to be negative for CSF and/or blood. A 1-2 ml. volume of Isovue-250 was injected and flow of contrast was noted at each level. Radiographs were obtained  for documentation purposes.   After attaining the desired flow of contrast documented above, a 0.5 to 1.0 ml test dose of 0.25% Marcaine was injected into each respective transforaminal space.  The patient was observed for 90 seconds post injection.  After no sensory deficits were reported, and normal lower extremity motor function was noted,   the above injectate was administered so that equal amounts of the injectate were placed at each foramen (level) into the transforaminal epidural space.   Additional Comments:  The patient tolerated the procedure well Dressing: 2 x 2 sterile gauze and Band-Aid    Post-procedure details: Patient was observed during the procedure. Post-procedure instructions were reviewed.  Patient left the clinic in stable condition.

## 2020-11-23 ENCOUNTER — Encounter: Payer: Self-pay | Admitting: Internal Medicine

## 2020-11-23 ENCOUNTER — Ambulatory Visit (INDEPENDENT_AMBULATORY_CARE_PROVIDER_SITE_OTHER): Payer: Medicare Other | Admitting: Internal Medicine

## 2020-11-23 ENCOUNTER — Other Ambulatory Visit: Payer: Self-pay

## 2020-11-23 ENCOUNTER — Encounter (HOSPITAL_COMMUNITY): Payer: Self-pay | Admitting: Emergency Medicine

## 2020-11-23 ENCOUNTER — Inpatient Hospital Stay (HOSPITAL_COMMUNITY)
Admission: EM | Admit: 2020-11-23 | Discharge: 2020-11-25 | DRG: 812 | Disposition: A | Discharge status: Home / Self Care | Payer: Medicare Other | Attending: Family Medicine | Admitting: Family Medicine

## 2020-11-23 DIAGNOSIS — D649 Anemia, unspecified: Secondary | ICD-10-CM | POA: Diagnosis not present

## 2020-11-23 DIAGNOSIS — I5032 Chronic diastolic (congestive) heart failure: Secondary | ICD-10-CM | POA: Diagnosis present

## 2020-11-23 DIAGNOSIS — Z7901 Long term (current) use of anticoagulants: Secondary | ICD-10-CM

## 2020-11-23 DIAGNOSIS — I4891 Unspecified atrial fibrillation: Secondary | ICD-10-CM | POA: Diagnosis present

## 2020-11-23 DIAGNOSIS — E785 Hyperlipidemia, unspecified: Secondary | ICD-10-CM | POA: Diagnosis not present

## 2020-11-23 DIAGNOSIS — R0600 Dyspnea, unspecified: Secondary | ICD-10-CM | POA: Diagnosis not present

## 2020-11-23 DIAGNOSIS — Z20822 Contact with and (suspected) exposure to covid-19: Secondary | ICD-10-CM | POA: Diagnosis present

## 2020-11-23 DIAGNOSIS — D62 Acute posthemorrhagic anemia: Secondary | ICD-10-CM | POA: Diagnosis not present

## 2020-11-23 DIAGNOSIS — I110 Hypertensive heart disease with heart failure: Secondary | ICD-10-CM | POA: Diagnosis not present

## 2020-11-23 DIAGNOSIS — Z981 Arthrodesis status: Secondary | ICD-10-CM

## 2020-11-23 DIAGNOSIS — I252 Old myocardial infarction: Secondary | ICD-10-CM

## 2020-11-23 DIAGNOSIS — Z91013 Allergy to seafood: Secondary | ICD-10-CM

## 2020-11-23 DIAGNOSIS — Z886 Allergy status to analgesic agent status: Secondary | ICD-10-CM

## 2020-11-23 DIAGNOSIS — Z79899 Other long term (current) drug therapy: Secondary | ICD-10-CM

## 2020-11-23 DIAGNOSIS — K219 Gastro-esophageal reflux disease without esophagitis: Secondary | ICD-10-CM | POA: Diagnosis present

## 2020-11-23 DIAGNOSIS — Z880 Allergy status to penicillin: Secondary | ICD-10-CM

## 2020-11-23 DIAGNOSIS — Z7982 Long term (current) use of aspirin: Secondary | ICD-10-CM

## 2020-11-23 DIAGNOSIS — N179 Acute kidney failure, unspecified: Secondary | ICD-10-CM | POA: Diagnosis not present

## 2020-11-23 DIAGNOSIS — Z87892 Personal history of anaphylaxis: Secondary | ICD-10-CM

## 2020-11-23 DIAGNOSIS — R0609 Other forms of dyspnea: Secondary | ICD-10-CM

## 2020-11-23 DIAGNOSIS — R310 Gross hematuria: Secondary | ICD-10-CM

## 2020-11-23 DIAGNOSIS — Z90710 Acquired absence of both cervix and uterus: Secondary | ICD-10-CM

## 2020-11-23 DIAGNOSIS — I480 Paroxysmal atrial fibrillation: Secondary | ICD-10-CM | POA: Diagnosis present

## 2020-11-23 DIAGNOSIS — Z885 Allergy status to narcotic agent status: Secondary | ICD-10-CM

## 2020-11-23 DIAGNOSIS — I10 Essential (primary) hypertension: Secondary | ICD-10-CM

## 2020-11-23 DIAGNOSIS — E861 Hypovolemia: Secondary | ICD-10-CM | POA: Diagnosis not present

## 2020-11-23 DIAGNOSIS — Z8249 Family history of ischemic heart disease and other diseases of the circulatory system: Secondary | ICD-10-CM

## 2020-11-23 DIAGNOSIS — Z91040 Latex allergy status: Secondary | ICD-10-CM

## 2020-11-23 DIAGNOSIS — Z888 Allergy status to other drugs, medicaments and biological substances status: Secondary | ICD-10-CM

## 2020-11-23 DIAGNOSIS — Z86711 Personal history of pulmonary embolism: Secondary | ICD-10-CM

## 2020-11-23 DIAGNOSIS — Z91018 Allergy to other foods: Secondary | ICD-10-CM

## 2020-11-23 DIAGNOSIS — I2510 Atherosclerotic heart disease of native coronary artery without angina pectoris: Secondary | ICD-10-CM | POA: Diagnosis present

## 2020-11-23 LAB — CBC
HCT: 21.7 % — ABNORMAL LOW (ref 36.0–46.0)
Hemoglobin: 6.5 g/dL — CL (ref 12.0–15.0)
MCH: 27.3 pg (ref 26.0–34.0)
MCHC: 30 g/dL (ref 30.0–36.0)
MCV: 91.2 fL (ref 80.0–100.0)
Platelets: 442 10*3/uL — ABNORMAL HIGH (ref 150–400)
RBC: 2.38 MIL/uL — ABNORMAL LOW (ref 3.87–5.11)
RDW: 18.6 % — ABNORMAL HIGH (ref 11.5–15.5)
WBC: 6.1 10*3/uL (ref 4.0–10.5)
nRBC: 0 % (ref 0.0–0.2)

## 2020-11-23 LAB — URINALYSIS, ROUTINE W REFLEX MICROSCOPIC
Bilirubin Urine: NEGATIVE
Glucose, UA: NEGATIVE mg/dL
Ketones, ur: NEGATIVE mg/dL
Nitrite: NEGATIVE
Protein, ur: 30 mg/dL — AB
RBC / HPF: >50 RBC/hpf — ABNORMAL HIGH (ref 0–5)
Specific Gravity, Urine: 1.013 (ref 1.005–1.030)
WBC, UA: >50 WBC/hpf — ABNORMAL HIGH (ref 0–5)
pH: 5 (ref 5.0–8.0)

## 2020-11-23 LAB — FERRITIN: Ferritin: 23 ng/mL (ref 11–307)

## 2020-11-23 LAB — RETICULOCYTES
Immature Retic Fract: 27.3 % — ABNORMAL HIGH (ref 2.3–15.9)
RBC.: 2.38 MIL/uL — ABNORMAL LOW (ref 3.87–5.11)
Retic Count, Absolute: 104.7 10*3/uL (ref 19.0–186.0)
Retic Ct Pct: 4.4 % — ABNORMAL HIGH (ref 0.4–3.1)

## 2020-11-23 LAB — COMPREHENSIVE METABOLIC PANEL
ALT: 20 U/L (ref 0–44)
AST: 24 U/L (ref 15–41)
Albumin: 3.6 g/dL (ref 3.5–5.0)
Alkaline Phosphatase: 76 U/L (ref 38–126)
Anion gap: 11 (ref 5–15)
BUN: 33 mg/dL — ABNORMAL HIGH (ref 8–23)
CO2: 22 mmol/L (ref 22–32)
Calcium: 9.4 mg/dL (ref 8.9–10.3)
Chloride: 108 mmol/L (ref 98–111)
Creatinine, Ser: 1.66 mg/dL — ABNORMAL HIGH (ref 0.44–1.00)
GFR, Estimated: 34 mL/min — ABNORMAL LOW (ref >60)
Glucose, Bld: 109 mg/dL — ABNORMAL HIGH (ref 70–99)
Potassium: 4.5 mmol/L (ref 3.5–5.1)
Sodium: 141 mmol/L (ref 135–145)
Total Bilirubin: 0.6 mg/dL (ref 0.3–1.2)
Total Protein: 7.2 g/dL (ref 6.5–8.1)

## 2020-11-23 LAB — IRON AND TIBC
Iron: 29 ug/dL (ref 28–170)
Saturation Ratios: 7 % — ABNORMAL LOW (ref 10.4–31.8)
TIBC: 445 ug/dL (ref 250–450)
UIBC: 416 ug/dL

## 2020-11-23 LAB — FOLATE: Folate: 44.6 ng/mL (ref >5.9)

## 2020-11-23 LAB — TROPONIN I (HIGH SENSITIVITY)
Troponin I (High Sensitivity): 5 ng/L (ref <18)
Troponin I (High Sensitivity): 4 ng/L (ref <18)

## 2020-11-23 LAB — PHOSPHORUS: Phosphorus: 3.5 mg/dL (ref 2.5–4.6)

## 2020-11-23 LAB — PREPARE RBC (CROSSMATCH)

## 2020-11-23 LAB — PROTIME-INR
INR: 2.7 — ABNORMAL HIGH (ref 0.8–1.2)
Prothrombin Time: 27.6 seconds — ABNORMAL HIGH (ref 11.4–15.2)

## 2020-11-23 LAB — MAGNESIUM: Magnesium: 2.1 mg/dL (ref 1.7–2.4)

## 2020-11-23 LAB — POC OCCULT BLOOD, ED: Fecal Occult Bld: NEGATIVE

## 2020-11-23 LAB — VITAMIN B12: Vitamin B-12: 278 pg/mL (ref 180–914)

## 2020-11-23 MED ORDER — DILTIAZEM HCL ER 240 MG PO CP24
240.0000 mg | ORAL_CAPSULE | Freq: Every day | ORAL | Status: DC
  Filled 2020-11-23: qty 1

## 2020-11-23 MED ORDER — PANTOPRAZOLE SODIUM 40 MG PO TBEC
40.0000 mg | DELAYED_RELEASE_TABLET | Freq: Every day | ORAL | Status: DC
  Administered 2020-11-23 – 2020-11-25 (×2): 40 mg via ORAL
  Filled 2020-11-23 (×2): qty 1

## 2020-11-23 MED ORDER — ROSUVASTATIN CALCIUM 20 MG PO TABS
20.0000 mg | ORAL_TABLET | Freq: Every day | ORAL | Status: DC
Start: 2020-11-23 — End: 2020-11-25
  Administered 2020-11-23 – 2020-11-25 (×3): 20 mg via ORAL
  Filled 2020-11-23 (×3): qty 1

## 2020-11-23 MED ORDER — DILTIAZEM HCL ER COATED BEADS 240 MG PO CP24
240.0000 mg | ORAL_CAPSULE | Freq: Every day | ORAL | Status: DC
  Administered 2020-11-23 – 2020-11-25 (×3): 240 mg via ORAL
  Filled 2020-11-23 (×3): qty 1

## 2020-11-23 MED ORDER — ONDANSETRON HCL 4 MG/2ML IJ SOLN: 4.0000 mg | Freq: Four times a day (QID) | INTRAMUSCULAR | Status: DC | PRN

## 2020-11-23 MED ORDER — ALFUZOSIN HCL ER 10 MG PO TB24
10.0000 mg | ORAL_TABLET | Freq: Every day | ORAL | Status: DC
  Administered 2020-11-24: 10 mg via ORAL
  Filled 2020-11-23: qty 1

## 2020-11-23 MED ORDER — SODIUM CHLORIDE 0.9 % IV SOLN
10.0000 mL/h | Freq: Once | INTRAVENOUS | Status: AC
  Administered 2020-11-23: 10 mL/h via INTRAVENOUS

## 2020-11-23 MED ORDER — SODIUM CHLORIDE 0.9% FLUSH: 3.0000 mL | INTRAVENOUS | Status: DC | PRN

## 2020-11-23 MED ORDER — SODIUM CHLORIDE 0.9 % IV SOLN: 250.0000 mL | INTRAVENOUS | Status: DC | PRN

## 2020-11-23 MED ORDER — ACETAMINOPHEN 650 MG RE SUPP: 650.0000 mg | Freq: Four times a day (QID) | RECTAL | Status: DC | PRN

## 2020-11-23 MED ORDER — SODIUM CHLORIDE 0.9% IV SOLUTION: Freq: Once | INTRAVENOUS | Status: AC

## 2020-11-23 MED ORDER — SODIUM CHLORIDE 0.9% FLUSH
3.0000 mL | Freq: Two times a day (BID) | INTRAVENOUS | Status: DC
  Administered 2020-11-24 – 2020-11-25 (×2): 3 mL via INTRAVENOUS

## 2020-11-23 MED ORDER — ONDANSETRON HCL 4 MG PO TABS: 4.0000 mg | ORAL_TABLET | Freq: Four times a day (QID) | ORAL | Status: DC | PRN

## 2020-11-23 MED ORDER — ACETAMINOPHEN 325 MG PO TABS
650.0000 mg | ORAL_TABLET | Freq: Four times a day (QID) | ORAL | Status: DC | PRN
  Administered 2020-11-25: 650 mg via ORAL
  Filled 2020-11-23: qty 2

## 2020-11-23 NOTE — ED Triage Notes (Signed)
Per pt, states she has a procedure done 2 weeks ago, had 2 stents placed-states she had a lot of post surgical bleeding, "lost a lot of blood"-Hgb 6.3, here for transfusion

## 2020-11-23 NOTE — Assessment & Plan Note (Signed)
Worsening and sounds consistent with anemia induced. Needs to go to ER for transfusion for Hg 6.3

## 2020-11-23 NOTE — ED Provider Notes (Signed)
[facility] DEPT Provider Note   CSN: [id number] Arrival date & time: 11/23/20  1014     History Chief Complaint  Patient presents with  . abnormal labs    Samantha Stevenson is a 70 y.o. female history of anemia, hypertension, atrial fibrillation (on Xarelto), dyslipidemia.  Patient presents with a chief complaint of abnormal labs.  Per chart review patient was seen today by primary care provider and yesterday was told by her urologist that her hemoglobin was 6.3.  Was advised to go to the emergency department yesterday but wanted to see her primary care provider prior.  Patient reports that she began having some shortness of breath, fatigue, dizziness lightheadedness last few days, the symptoms have been constant, progressively worsening, no alleviating factors, worse with exertion.  She states that she has been having hematuria.  Her hematuria stopped on Friday 11/19/20.  Patient is on Xarelto for atrial fibrillation.  Has been taking as prescribed.  Patient reports that she did not take her dose today.  Per chart review patient had a diagnostic cystoscopy with retrograde pyelogram, bilateral diagnostic ureteroscopy and bilateral stent placement on 11/02/20 performed by Dr. Claudia Desanctis.     HPI     Past Medical History:  Diagnosis Date  . Allergy   . Anemia   . Arthritis    "qwhere" (07/29/2018)  . Atrial fibrillation ([place])   . CHF (congestive heart failure) ([place])   . Clotting disorder ([place])   . Degenerative arthritis of hip    s/p L THR 12/2012  . Dyspnea    since surgery in August 2019- "when I get worked up and walk a short distance"  . Dysrhythmia   . History of blood transfusion 05/2018   "related to OR"  . Hyperlipidemia   . Hypertension   . Intramural hematoma of thoracic aorta ([place]) 06/13/2018  . Migraines    mIgraines- none since blood pressure and lipids are under control  . Myocardial infarction ([place]) 2000   due to atrial fib  .  Neuromuscular disorder ([place])    pinched nerve- left side of neck  . Pulmonary emboli () 12/2012 dx   post op (L THR), anticoag x 16mo    Patient Active Problem List   Diagnosis Date Noted  . Acute blood loss anemia 11/23/2020  . Acute lateral meniscal tear, left, subsequent encounter 11/16/2020  . Cervical spondylosis with myelopathy and radiculopathy 02/02/2020  . Chronic neck pain 07/17/2019  . LLQ pain 05/16/2019  . Gross hematuria 05/16/2019  . Chronic bilateral low back pain without sciatica 10/07/2018  . Pain and swelling of left lower leg 08/08/2018  . Left arm swelling 08/08/2018  . Hematuria, gross 07/29/2018  . Thoracic aortic dissection ([place]) 06/13/2018  . Intramural aortic hematoma ([place]) 05/28/2018  . Dysfunction of left eustachian tube 04/05/2018  . Preoperative clearance 02/27/2018  . Bilateral leg edema 02/27/2018  . Dyspnea 06/19/2016  . Routine general medical examination at a health care facility 06/24/2015  . Constipation 03/17/2015  . Myalgia and myositis 03/17/2015  . Dyslipidemia   . Atrial fibrillation ([place])   . Hypertension   . History of pulmonary embolism 01/10/2013  . Anemia 01/10/2013  . Arthritis 01/03/2013    Past Surgical History:  Procedure Laterality Date  . ABDOMINAL HYSTERECTOMY     "w/1 tube"  . ANTERIOR CERVICAL DECOMPRESSION/DISCECTOMY FUSION 4 LEVELS N/A 02/02/2020   Procedure: CERVICAL THREE-FOUR, CERVICAL FOUR-FIVE, CERVICAL FIVE-SIX, CERVICAL SIX-SEVEN ANTERIOR CERVICAL DECOMPRESSION/DISCECTOMY FUSION;  Surgeon: Earnie Larsson,  MD;  Location: [facility];  Service: Neurosurgery;  Laterality: N/A;  . ARTERY REPAIR Left 08/15/2018   Procedure: LEFT BRACHIAL ARTERY EXPLORATION;  Surgeon: Waynetta Sandy, MD;  Location: [facility];  Service: Vascular;  Laterality: Left;  . Breast Ultrasound Left 04/16/13   Done @ breast center Impression: no malignancy appearance noted on the screen study is consistent with a summation shadow  . CARDIAC  CATHETERIZATION    . CAROTID-SUBCLAVIAN BYPASS GRAFT Left 06/13/2018   Procedure: BYPASS GRAFT CAROTID-SUBCLAVIAN;  Surgeon: Waynetta Sandy, MD;  Location: [facility];  Service: Vascular;  Laterality: Left;  . COLONOSCOPY    . CYSTOSCOPY WITH RETROGRADE PYELOGRAM, URETEROSCOPY AND STENT PLACEMENT Bilateral 11/02/2020   Procedure: DIAGNOSTIC CYSTOSCOPY WITH RETROGRADE PYELOGRAM,BILATERAL DIAGNOSTIC URETEROSCOPY  AND BILATERAL STENT PLACEMENT;  Surgeon: Robley Fries, MD;  Location[facility];  Service: Urology;  Laterality: Bilateral;  90 MINS  . DG TUMB RIGHT HAND Right    cyst removal  . IR THORACENTESIS ASP PLEURAL SPACE W/IMG GUIDE  05/31/2018  . JOINT REPLACEMENT    . THORACIC AORTIC ENDOVASCULAR STENT GRAFT Left 06/13/2018   Procedure: LEFT  SUBCLAVIAN TO CAROTID ARTERY TRANSPOSITION; THORACIC AORTIC ENDOVASCULAR STENT GRAFT using GORE CONFORMABLE THORACIC STENT GRAFT AND GORE TAG THORACIC ENDOPROSTHESIS; STENT LEFT COMMON CAROTID ARTERY, RIGHT COMMON-FEMORAL ENDARTERECTOMY WITH PATCH ANGIOPLASTY;  Surgeon: Waynetta Sandy, MD;  Location: [facility];  Service: Vascular;  Laterality: Left;  . TONSILLECTOMY    . TOTAL HIP ARTHROPLASTY Left 01/03/2013   Procedure: TOTAL HIP ARTHROPLASTY ANTERIOR APPROACH;  Surgeon: Mcarthur Rossetti, MD;  Location: [facility];  Service: Orthopedics;  Laterality: Left;  Left Total Hip Arthroplasty, Anterior Approach  . TUBAL LIGATION    . VAGINA RECONSTRUCTION SURGERY  1966   vagina had closed up when 70 years old  . WOUND EXPLORATION Left 06/16/2018   Procedure: LEFT NECK WOUND EXPLORATION, CHEST TUBE INSERTION;  Surgeon: Waynetta Sandy, MD;  Location: [facility];  Service: Vascular;  Laterality: Left;     OB History   No obstetric history on file.     Family History  Problem Relation Age of Onset  . Heart disease Father   . Diabetes Mother   . Heart disease Mother        pacemaker  . Heart attack Mother 60  .  Diabetes Sister   . Alcohol abuse Other   . Heart disease Other   . Hyperlipidemia Other   . Hypertension Other   . Diabetes Other   . Breast cancer Other   . Colon cancer Neg Hx   . Esophageal cancer Neg Hx   . Stomach cancer Neg Hx   . Rectal cancer Neg Hx     Social History   Tobacco Use  . Smoking status: Never Smoker  . Smokeless tobacco: Never Used  Vaping Use  . Vaping Use: Never used  Substance Use Topics  . Alcohol use: Not Currently  . Drug use: Never    Home Medications Prior to Admission medications   Medication Sig Start Date End Date Taking? Authorizing Provider  acetaminophen (TYLENOL) 500 MG tablet Take 1,000 mg by mouth every 8 (eight) hours as needed for moderate pain.    [provider]  acetaminophen-codeine (TYLENOL #3) 300-30 MG tablet Take 1-2 tablets by mouth every 8 (eight) hours as needed. 10/20/20   Mcarthur Rossetti, MD  alfuzosin (UROXATRAL) 10 MG 24 hr tablet Take 1 tablet (10 mg total) by mouth daily  with breakfast. 11/02/20   Jacalyn Lefevre D, MD  aspirin EC 81 MG tablet Take 81 mg by mouth daily.    [provider]  diltiazem (DILACOR XR) 240 MG 24 hr capsule TAKE 1 CAPSULE(240 MG) BY MOUTH EVERY MORNING Patient taking differently: Take 240 mg by mouth daily. TAKE 1 CAPSULE(240 MG) BY MOUTH EVERY MORNING 08/13/20   Biagio Borg, MD  ferrous sulfate 325 (65 FE) MG tablet Take 325 mg by mouth once a week.  01/05/13   Pete Pelt, PA-C  furosemide (LASIX) 20 MG tablet TAKE 1 TABLET BY MOUTH EVERY DAY Patient taking differently: Take 20 mg by mouth daily. 08/02/20   Camnitz, Ocie Doyne, MD  HYDROcodone-acetaminophen (NORCO/VICODIN) 5-325 MG tablet Take 1 tablet by mouth every 4 (four) hours as needed for moderate pain. 11/02/20 11/02/21  Robley Fries, MD  Multiple Vitamin (MULTIVITAMIN) tablet Take 2 tablets by mouth daily. Gummies    [provider]  oxybutynin (DITROPAN) 5 MG tablet Take 1 tablet (5 mg  total) by mouth every 8 (eight) hours as needed for bladder spasms. 11/02/20   Robley Fries, MD  rivaroxaban (XARELTO) 20 MG TABS tablet TAKE 1 TABLET(20 MG) BY MOUTH DAILY WITH SUPPER 05/04/20   Camnitz, Ocie Doyne, MD  rosuvastatin (CRESTOR) 20 MG tablet TAKE 1 TABLET(20 MG) BY MOUTH DAILY 08/13/20   Biagio Borg, MD  tiZANidine (ZANAFLEX) 4 MG tablet Take 1 tablet (4 mg total) by mouth every 8 (eight) hours as needed for muscle spasms. 10/06/20   Mcarthur Rossetti, MD    Allergies    Penicillins, Shellfish allergy, Latex, Banana, Kiwi extract, Oxycodone, Asa [aspirin], Celebrex [celecoxib], and Nsaids  Review of Systems   Review of Systems  Constitutional: Negative for chills and fever.  Respiratory: Positive for shortness of breath (with exertion).   Cardiovascular: Negative for chest pain.  Gastrointestinal: Negative for abdominal pain, nausea and vomiting.  Endocrine: Positive for cold intolerance.  Genitourinary: Negative for difficulty urinating, dysuria, frequency and hematuria.  Musculoskeletal: Negative for back pain and neck pain.  Skin: Negative for color change and rash.  Neurological: Positive for dizziness and light-headedness. Negative for syncope and headaches.  Hematological: Does not bruise/bleed easily.  Psychiatric/Behavioral: Negative for confusion.    Physical Exam Updated Vital Signs BP 109/67 (BP Location: Left Arm)   Pulse 78   Temp 98.6 F (37 C) (Oral)   Resp 16   Ht 5\' 4"  (1.626 m)   Wt 72.1 kg   SpO2 100%   BMI 27.29 kg/m   Physical Exam Vitals and nursing note reviewed. Exam conducted with a chaperone present (female RN present).  Constitutional:      General: She is not in acute distress.    Appearance: She is not ill-appearing, toxic-appearing or diaphoretic.  Eyes:     General: No scleral icterus.       Right eye: No discharge.        Left eye: No discharge.     Extraocular Movements: Extraocular movements intact.      Pupils: Pupils are equal, round, and reactive to light.  Cardiovascular:     Rate and Rhythm: Normal rate.  Pulmonary:     Effort: Pulmonary effort is normal. No tachypnea, bradypnea or respiratory distress.     Breath sounds: No stridor. No wheezing, rhonchi or rales.  Chest:     Chest wall: No tenderness.  Abdominal:     General: There is no distension. There are  no signs of injury.     Palpations: Abdomen is soft. There is no mass or pulsatile mass.     Tenderness: There is no abdominal tenderness. There is no guarding or rebound.  Genitourinary:    Rectum: No mass, tenderness, anal fissure or external hemorrhoid. Normal anal tone.     Comments: No blood or melena noted on gloved hand after rectal exam Musculoskeletal:     Cervical back: Normal range of motion and neck supple. No rigidity.     Right lower leg: No deformity, tenderness or bony tenderness. No edema.     Left lower leg: No deformity, tenderness or bony tenderness. No edema.  Skin:    General: Skin is warm and dry.  Neurological:     General: No focal deficit present.     Mental Status: She is alert.     GCS: GCS eye subscore is 4. GCS verbal subscore is 5. GCS motor subscore is 6.     Cranial Nerves: No cranial nerve deficit or facial asymmetry.     Sensory: Sensation is intact.     Motor: No weakness, tremor, seizure activity or pronator drift.     Coordination: Romberg sign negative. Finger-Nose-Finger Test normal.     Gait: Gait is intact. Gait normal.     Comments: CN II-XII intact, equal grip strength, +5 strength to bilateral upper and lower extremities   Psychiatric:        Behavior: Behavior is cooperative.     ED Results / Procedures / Treatments   Labs (all labs ordered are listed, but only abnormal results are displayed) Labs Reviewed  COMPREHENSIVE METABOLIC PANEL - Abnormal; Notable for the following components:      Result Value   Glucose, Bld 109 (*)    BUN 33 (*)    Creatinine, Ser 1.66 (*)     GFR, Estimated 34 (*)    All other components within normal limits  CBC - Abnormal; Notable for the following components:   RBC 2.38 (*)    Hemoglobin 6.5 (*)    HCT 21.7 (*)    RDW 18.6 (*)    Platelets 442 (*)    All other components within normal limits  RETICULOCYTES - Abnormal; Notable for the following components:   Retic Ct Pct 4.4 (*)    RBC. 2.38 (*)    Immature Retic Fract 27.3 (*)    All other components within normal limits  VITAMIN B12  FOLATE  IRON AND TIBC  FERRITIN  TYPE AND SCREEN    EKG None  Radiology No results found.  Procedures .Critical Care Performed by: Loni Beckwith, PA-C Authorized by: Loni Beckwith, PA-C   Critical care provider statement:    Critical care time (minutes):  45   Critical care time was exclusive of:  Separately billable procedures and treating other patients   Critical care was necessary to treat or prevent imminent or life-threatening deterioration of the following conditions: Symptomatic anemia requiring blood transfusion.   Critical care was time spent personally by me on the following activities:  Discussions with consultants, evaluation of patient's response to treatment, examination of patient, ordering and performing treatments and interventions, ordering and review of laboratory studies, ordering and review of radiographic studies, pulse oximetry, re-evaluation of patient's condition, obtaining history from patient or surrogate and review of old charts   Care discussed with: admitting provider       Medications Ordered in ED Medications - No data to display  ED Course  I have reviewed the triage vital signs and the nursing notes.  Pertinent labs & imaging results that were available during my care of the patient were reviewed by me and considered in my medical decision making (see chart for details).    MDM Rules/Calculators/A&P                          Alert 70 year old female in no acute  distress, nontoxic-appearing.  Patient presents with chief complaint of symptomatic anemia.  Patient reports gross hematuria prior to and after iagnostic cystoscopy with retrograde pyelogram, bilateral diagnostic ureteroscopy and bilateral stent placement on 11/02/20.  Patient reports that her gross hematuria stopped on 1/28.  Patient endorses exertional shortness of breath, lightheadedness, dizziness, fatigue.  Patient currently taking Xarelto for atrial fibrillation.  Patient reports she did not take Xarelto today.  CBC shows hemoglobin 6.5, hematocrit 21.7 and RBC 2.38. Values decreased from labs obtained 4 weeks prior which showed hemoglobin 10.1, hematocrit 33.9 and RBC 3.71.  Reticulocyte count elevated at 4.4. type and screen ordered and resulted.    Due to these findings and patient's physical symptoms will transfuse 2 units of blood.  Patient consent obtained.  Patient also noted to have an acute kidney injury with BUN elevated to 33 and creatinine elevated at 1.66.  These values are increased from labs obtained 4 weeks prior which showed BUN at 12 and creatinine at 0.72  Iron, TIBC, vitamin B12, ferritin, folate, INR, and UA ordered and pending.  COVID-19 swab ordered for admission.  Consult was placed to hospitalist.  13:54 spoke with Dr. Dyann Kief who will see the patient and admit.    Critical care was indicated as patient had symptomatic anemia requiring blood transfusion.  Patient was discussed with and evaluated by Dr. Kathrynn Humble.       Final Clinical Impression(s) / ED Diagnoses Final diagnoses:  Symptomatic anemia    Rx / DC Orders ED Discharge Orders    None       Loni Beckwith, PA-C 11/24/20 Aletta Edouard, MD 11/24/20 [phone]

## 2020-11-23 NOTE — Patient Instructions (Signed)
We will have you go to [place] ER today for blood transfusion

## 2020-11-23 NOTE — Progress Notes (Signed)
Subjective:   Patient ID: Samantha Stevenson, female    DOB: 1951-09-27, 70 y.o.   MRN: [id number]  HPI The patient is a 70 YO female coming in for concerns about SOB with exertion even short distances and abdominal pain. She has been having urological procedure with stenting 11/02/20 and has had bleeding and clots since that time. Did stop having bleeding Friday but admits to still having darker urine. Is having some chest pains as well. Is cold and drinking cold liquids causes her problems. Her urologist did communicate with me this morning that labs were checked yesterday and her Hg was 6.3. She did advise patient to go to ER but she did not want to do this without seeing [place] first. History of heart attack and she is not sure if chest pain feels similar. Blood clots stopped Friday. She is supposed to have knee surgery end of February but feels terrible at this time.   PMH, [facility], social history reviewed and updated  Review of Systems  Constitutional: Positive for activity change, appetite change and fatigue.  HENT: Negative.   Eyes: Negative.   Respiratory: Positive for shortness of breath. Negative for cough and chest tightness.   Cardiovascular: Positive for chest pain. Negative for palpitations and leg swelling.  Gastrointestinal: Positive for abdominal distention. Negative for abdominal pain, anal bleeding, blood in stool, constipation, diarrhea, nausea and vomiting.  Genitourinary:       Blood in urine  Musculoskeletal: Positive for myalgias.  Skin: Negative.   Neurological: Positive for weakness and light-headedness.  Psychiatric/Behavioral: Negative.     Objective:  Physical Exam Constitutional:      Appearance: She is well-developed and well-nourished. She is ill-appearing.     Comments: Appears unwell  HENT:     Head: Normocephalic and atraumatic.  Eyes:     Extraocular Movements: EOM normal.  Cardiovascular:     Rate and Rhythm: Normal rate and regular rhythm.  Pulmonary:      Effort: Pulmonary effort is normal. No respiratory distress.     Breath sounds: Normal breath sounds. No wheezing or rales.  Abdominal:     General: Bowel sounds are normal. There is no distension.     Palpations: Abdomen is soft.     Tenderness: There is abdominal tenderness. There is no rebound.  Musculoskeletal:        General: No edema.     Cervical back: Normal range of motion.  Skin:    General: Skin is warm and dry.  Neurological:     Mental Status: She is alert and oriented to person, place, and time.     Coordination: Coordination normal.  Psychiatric:        Mood and Affect: Mood and affect normal.     Vitals:   11/23/20 0935  BP: 130/78  Pulse: 72  Temp: 98 F (36.7 C)  TempSrc: Oral  SpO2: 98%  Weight: 159 lb 9.6 oz (72.4 kg)  Height: 5\' 4"  (1.626 m)    This visit occurred during the SARS-CoV-2 public health emergency.  Safety protocols were in place, including screening questions prior to the visit, additional usage of staff PPE, and extensive cleaning of exam room while observing appropriate contact time as indicated for disinfecting solutions.   Assessment & Plan:  Visit time 20 minutes in face to face communication with patient and coordination of care, additional 10 minutes spent in record review, coordination or care, ordering tests, communicating/referring to other healthcare professionals, documenting in medical records all  on the same day of the visit for total time 30 minutes spent on the visit.

## 2020-11-23 NOTE — H&P (Signed)
History and Physical    Samantha Stevenson [id number] DOB: [birth year] DOA: 11/23/2020  PCP: Hoyt Koch, MD   Patient coming from: home   I have personally briefly reviewed patient's old medical records in [place]  Chief Complaint: Shortness of breath, easy fatigability, dizziness and abnormal blood work results.  HPI: Samantha Stevenson is a 70 y.o. female with medical history significant of history of paroxysmal atrial fibrillation on chronic Xarelto, hypertension, hyperlipidemia, history of chronic diastolic heart failure and recent work-up for hematuria blood loss; who presented to the hospital secondary to above-described symptoms which have been ongoing for the last 2 weeks or so and worsening.  Patient reports no fever, no chills, no nausea, no vomiting, no dysuria, no hematemesis, no melena, no focal weakness or sick contacts.  Of note, patient has been seen by Dr. Claudia Desanctis [facility] urology for work-up of gross hematuria and is status post cystoscopy with retrograde pyelogram, bilateral diagnostic ureteroscopy and bilateral stent placement on 11/02/2020; patient expressed hematuria and clots continue to be seen after stenting; and because of associated pain/discomfort her stents were removed on 11/09/20 at the urologist office.   Since 11/19/20, she has not seen any further blood in her urine.  ED Course: Patient found to be anemic with a hemoglobin of 6.5 and with soft blood pressure on presentation.  Patient also reported feeling dizzy when changing positions and very short of breath with any activity.  Ferritin 23, negative troponin, iron and TIBC saturation ratio is 7, B12 278, folate 44.6; BUN 33, creatinine 1.66.  2 units of PRBCs has been order, TRH has been called to place patient in the hospital for further evaluation and management.  Review of Systems: As per HPI otherwise all other systems reviewed and are negative.   Past Medical History:  Diagnosis Date  .  Allergy   . Anemia   . Arthritis    "qwhere" (07/29/2018)  . Atrial fibrillation ([place])   . CHF (congestive heart failure) ([place])   . Clotting disorder ([place])   . Degenerative arthritis of hip    s/p L THR 12/2012  . Dyspnea    since surgery in August 2019- "when I get worked up and walk a short distance"  . Dysrhythmia   . History of blood transfusion 05/2018   "related to OR"  . Hyperlipidemia   . Hypertension   . Intramural hematoma of thoracic aorta ([place]) 06/13/2018  . Migraines    mIgraines- none since blood pressure and lipids are under control  . Myocardial infarction ([place]) 2000   due to atrial fib  . Neuromuscular disorder ([place])    pinched nerve- left side of neck  . Pulmonary emboli ([place]) 12/2012 dx   post op (L THR), anticoag x 22mo    Past Surgical History:  Procedure Laterality Date  . ABDOMINAL HYSTERECTOMY     "w/1 tube"  . ANTERIOR CERVICAL DECOMPRESSION/DISCECTOMY FUSION 4 LEVELS N/A 02/02/2020   Procedure: CERVICAL THREE-FOUR, CERVICAL FOUR-FIVE, CERVICAL FIVE-SIX, CERVICAL SIX-SEVEN ANTERIOR CERVICAL DECOMPRESSION/DISCECTOMY FUSION;  Surgeon: Earnie Larsson, MD;  Location: [place];  Service: Neurosurgery;  Laterality: N/A;  . ARTERY REPAIR Left 08/15/2018   Procedure: LEFT BRACHIAL ARTERY EXPLORATION;  Surgeon: Waynetta Sandy, MD;  Location: [facility];  Service: Vascular;  Laterality: Left;  . Breast Ultrasound Left 04/16/13   Done @ breast center Impression: no malignancy appearance noted on the screen study is consistent with a summation shadow  . CARDIAC CATHETERIZATION    . CAROTID-SUBCLAVIAN  BYPASS GRAFT Left 06/13/2018   Procedure: BYPASS GRAFT CAROTID-SUBCLAVIAN;  Surgeon: Waynetta Sandy, MD;  Location: [place];  Service: Vascular;  Laterality: Left;  . COLONOSCOPY    . CYSTOSCOPY WITH RETROGRADE PYELOGRAM, URETEROSCOPY AND STENT PLACEMENT Bilateral 11/02/2020   Procedure: DIAGNOSTIC CYSTOSCOPY WITH RETROGRADE PYELOGRAM,BILATERAL DIAGNOSTIC  URETEROSCOPY  AND BILATERAL STENT PLACEMENT;  Surgeon: Robley Fries, MD;  Location: [facility];  Service: Urology;  Laterality: Bilateral;  90 MINS  . DG TUMB RIGHT HAND Right    cyst removal  . IR THORACENTESIS ASP PLEURAL SPACE W/IMG GUIDE  05/31/2018  . JOINT REPLACEMENT    . THORACIC AORTIC ENDOVASCULAR STENT GRAFT Left 06/13/2018   Procedure: LEFT  SUBCLAVIAN TO CAROTID ARTERY TRANSPOSITION; THORACIC AORTIC ENDOVASCULAR STENT GRAFT using GORE CONFORMABLE THORACIC STENT GRAFT AND GORE TAG THORACIC ENDOPROSTHESIS; STENT LEFT COMMON CAROTID ARTERY, RIGHT COMMON-FEMORAL ENDARTERECTOMY WITH PATCH ANGIOPLASTY;  Surgeon: Waynetta Sandy, MD;  Location: [place];  Service: Vascular;  Laterality: Left;  . TONSILLECTOMY    . TOTAL HIP ARTHROPLASTY Left 01/03/2013   Procedure: TOTAL HIP ARTHROPLASTY ANTERIOR APPROACH;  Surgeon: Mcarthur Rossetti, MD;  Location: [facility];  Service: Orthopedics;  Laterality: Left;  Left Total Hip Arthroplasty, Anterior Approach  . TUBAL LIGATION    . VAGINA RECONSTRUCTION SURGERY  1966   vagina had closed up when 70 years old  . WOUND EXPLORATION Left 06/16/2018   Procedure: LEFT NECK WOUND EXPLORATION, CHEST TUBE INSERTION;  Surgeon: Waynetta Sandy, MD;  Location: [place];  Service: Vascular;  Laterality: Left;    Social History  reports that she has never smoked. She has never used smokeless tobacco. She reports previous alcohol use. She reports that she does not use drugs.  Allergies  Allergen Reactions  . Penicillins Swelling and Other (See Comments)    Severe swelling PATIENT HAS HAD A PCN REACTION WITH IMMEDIATE RASH, FACIAL/TONGUE/THROAT SWELLING, SOB, OR LIGHTHEADEDNESS WITH HYPOTENSION:  #  #  YES  #  #  Has patient had a PCN reaction causing severe rash involving mucus membranes or skin necrosis: No PATIENT HAS HAD A PCN REACTION THAT REQUIRED HOSPITALIZATION:  #  #  YES  #  #  Has patient had a PCN reaction occurring  within the last 10 years: No  . Shellfish Allergy Anaphylaxis  . Latex Hives and Rash  . Banana Nausea Only  . Kiwi Extract   . Oxycodone     Pt stated, "Made me feel really drowsy" Tolerating as of 02/16/20 after neck surgery  . Asa [Aspirin] Nausea And Vomiting    Makes stomach upset Asprin 325  . Celebrex [Celecoxib] Nausea And Vomiting    GI upset  . Nsaids Rash    Family History  Problem Relation Age of Onset  . Heart disease Father   . Diabetes Mother   . Heart disease Mother        pacemaker  . Heart attack Mother 47  . Diabetes Sister   . Alcohol abuse Other   . Heart disease Other   . Hyperlipidemia Other   . Hypertension Other   . Diabetes Other   . Breast cancer Other   . Colon cancer Neg Hx   . Esophageal cancer Neg Hx   . Stomach cancer Neg Hx   . Rectal cancer Neg Hx     Prior to Admission medications   Medication Sig Start Date End Date Taking? Authorizing Provider  acetaminophen (TYLENOL) 500 MG tablet Take  1,000 mg by mouth every 8 (eight) hours as needed for moderate pain.   Yes [provider]  acetaminophen-codeine (TYLENOL #3) 300-30 MG tablet Take 1-2 tablets by mouth every 8 (eight) hours as needed. 10/20/20  Yes Mcarthur Rossetti, MD  alfuzosin (UROXATRAL) 10 MG 24 hr tablet Take 1 tablet (10 mg total) by mouth daily with breakfast. 11/02/20  Yes Jacalyn Lefevre D, MD  aspirin EC 81 MG tablet Take 81 mg by mouth daily.   Yes [provider]  diltiazem (DILACOR XR) 240 MG 24 hr capsule TAKE 1 CAPSULE(240 MG) BY MOUTH EVERY MORNING Patient taking differently: Take 240 mg by mouth daily. TAKE 1 CAPSULE(240 MG) BY MOUTH EVERY MORNING 08/13/20  Yes Biagio Borg, MD  ferrous sulfate 325 (65 FE) MG tablet Take 325 mg by mouth once a week.  01/05/13  Yes Pete Pelt, PA-C  furosemide (LASIX) 20 MG tablet TAKE 1 TABLET BY MOUTH EVERY DAY Patient taking differently: Take 20 mg by mouth daily. 08/02/20  Yes Camnitz, Ocie Doyne, MD   HYDROcodone-acetaminophen (NORCO/VICODIN) 5-325 MG tablet Take 1 tablet by mouth every 4 (four) hours as needed for moderate pain. 11/02/20 11/02/21 Yes PaceJohny Chess, MD  Multiple Vitamin (MULTIVITAMIN) tablet Take 2 tablets by mouth daily. Gummies   Yes [provider]  ondansetron (ZOFRAN) 4 MG tablet Take 4 mg by mouth as needed for nausea or vomiting. 11/04/20  Yes [provider]  oxybutynin (DITROPAN) 5 MG tablet Take 1 tablet (5 mg total) by mouth every 8 (eight) hours as needed for bladder spasms. 11/02/20  Yes Pace, Johny Chess, MD  rivaroxaban (XARELTO) 20 MG TABS tablet TAKE 1 TABLET(20 MG) BY MOUTH DAILY WITH SUPPER Patient taking differently: Take 20 mg by mouth daily with supper. 05/04/20  Yes Camnitz, Will Hassell Done, MD  rosuvastatin (CRESTOR) 20 MG tablet TAKE 1 TABLET(20 MG) BY MOUTH DAILY Patient taking differently: Take 20 mg by mouth daily. 08/13/20  Yes Biagio Borg, MD  nitrofurantoin, macrocrystal-monohydrate, (MACROBID) 100 MG capsule Take 100 mg by mouth 2 (two) times daily. Patient not taking: Reported on 11/23/2020 11/22/20   [provider]  tiZANidine (ZANAFLEX) 4 MG tablet Take 1 tablet (4 mg total) by mouth every 8 (eight) hours as needed for muscle spasms. Patient not taking: Reported on 11/23/2020 10/06/20   Mcarthur Rossetti, MD    Physical Exam: Vitals:   11/23/20 1500 11/23/20 1550 11/23/20 1617 11/23/20 1700  BP: 114/79 (!) 127/51 123/71 121/66  Pulse: 79 78 72 72  Resp: (!) 21 20 19 19   Temp:  98 F (36.7 C) 98.2 F (36.8 C)   TempSrc:   Oral   SpO2: 100% 100% 100% 100%  Weight:      Height:        Constitutional: In NAD while resting; denies nausea, vomiting, chest pain or abdominal pain.  Patient is afebrile. Vitals:   11/23/20 1500 11/23/20 1550 11/23/20 1617 11/23/20 1700  BP: 114/79 (!) 127/51 123/71 121/66  Pulse: 79 78 72 72  Resp: (!) 21 20 19 19   Temp:  98 F (36.7 C) 98.2 F (36.8 C)   TempSrc:   Oral    SpO2: 100% 100% 100% 100%  Weight:      Height:       Eyes: PERRL, lids and conjunctivae normal, no icterus or nystagmus. ENMT: Mucous membranes are moist. Posterior pharynx clear of any exudate or lesions. Neck: normal, supple, no masses, no thyromegaly,  no JVD. Respiratory: clear to auscultation bilaterally, no wheezing, no crackles. Normal respiratory effort. No accessory muscle use.  Cardiovascular: Regular rate and rhythm, no rubs, no gallops, no appreciated murmurs.  No JVD Abdomen: no tenderness, no masses palpated. No hepatosplenomegaly. Bowel sounds positive.  Musculoskeletal: no clubbing / cyanosis. No joint deformity upper and lower extremities. Good ROM, no contractures. Normal muscle tone.  Skin: no rashes, lesions, ulcers. No induration Neurologic: CN 2-12 grossly intact. Sensation intact, DTR normal. Strength 5/5 in all 4.  Psychiatric: Normal judgment and insight. Alert and oriented x 3. Normal mood.   Labs on Admission: I have personally reviewed following labs and imaging studies  CBC: Recent Labs  Lab 11/23/20 1028  WBC 6.1  HGB 6.5*  HCT 21.7*  MCV 91.2  PLT 442*    Basic Metabolic Panel: Recent Labs  Lab 11/23/20 1028  NA 141  K 4.5  CL 108  CO2 22  GLUCOSE 109*  BUN 33*  CREATININE 1.66*  CALCIUM 9.4    GFR: Estimated Creatinine Clearance: 32 mL/min (A) (by C-G formula based on SCr of 1.66 mg/dL (H)).  Liver Function Tests: Recent Labs  Lab 11/23/20 1028  AST 24  ALT 20  ALKPHOS 76  BILITOT 0.6  PROT 7.2  ALBUMIN 3.6    Urine analysis:    Component Value Date/Time   COLORURINE YELLOW 11/23/2020 1329   APPEARANCEUR CLOUDY (A) 11/23/2020 1329   LABSPEC 1.013 11/23/2020 1329   PHURINE 5.0 11/23/2020 1329   GLUCOSEU NEGATIVE 11/23/2020 1329   GLUCOSEU 100 (A) 05/16/2019 1448   HGBUR LARGE (A) 11/23/2020 1329   BILIRUBINUR NEGATIVE 11/23/2020 1329   KETONESUR NEGATIVE 11/23/2020 1329   PROTEINUR 30 (A) 11/23/2020 1329    UROBILINOGEN 2.0 (A) 05/16/2019 1448   NITRITE NEGATIVE 11/23/2020 1329   LEUKOCYTESUR MODERATE (A) 11/23/2020 1329    Radiological Exams on Admission: No results found.  EKG: Independently reviewed.   Assessment/Plan 1-symptomatic anemia: In the setting of acute blood loss anemia -Patient with ongoing gross hematuria until  approximately 11/19/2020 -Today's UA still demonstrating large amount of red blood cells. -We will transfuse 2 units of PRBCs -Follow hemoglobin trend -As discussed with patient she will require close follow-up with urology service. -In order to be thorough we will check fecal occult blood test; holding aspirin and Xarelto at this moment. -Transfusion threshold less than 7.5 (patient with prior history of myocardial infarction).  2-Dyslipidemia -Continue the use of Crestor.  3-GERD -continue PPI  4-history of paroxysmal atrial fibrillation ([place]) -Holding Xarelto in the setting of acute blood loss anemia -Continue diltiazem  5-Hypertension -Blood pressure stable -Continue diltiazem.  6-chronic diastolic heart failure -Currently compensated -Follow daily weights -Continue low-sodium diet -Holding Lasix overnight in the setting of acute kidney injury. -Reassess volume status and renal function prior to resume diuretics.  7-acute kidney injury -In the setting of hypovolemia due to acute blood loss anemia and continue use of nephrotoxic agents (diuretics). -Gentle fluids given in the ED; 2 units PRBCs to be transfused -Holding Lasix overnight -Follow-up renal function trend.  8-history of myocardial infarction -Patient expressing intermittent chest pain mainly on exertion -Appears to be associated with ongoing anemia -Negative troponin x2 -EKG without acute ischemic changes. -Continue Crestor -Holding aspirin in the setting of acute blood loss. -Transfuse 2 units of PRBCs -Telemetry monitoring overnight.   DVT prophylaxis: SCDs Code Status:    Full code Family Communication:  No family at bedside.  Patient expressed that she will update  her daughter over the phone by herself. Disposition Plan:   Patient is from:  home  Anticipated DC to:  home  Anticipated DC date:  11/24/20  Anticipated DC barriers: Stabilization of low Hgb  Consults called:  None  Admission status:  Observation, telemetry, LOS < 2 midnights.    Barton Dubois MD [facility]  How to contact the [facility] Attending or Consulting provider [place] or covering provider during after hours [place], for this patient?   1. Check the care team in [facility] and look for a) attending/consulting TRH provider listed and b) the [facility] team listed 2. Log into [email or web address] and use [place]'s universal password to access. If you do not have the password, please contact the hospital operator. 3. Locate the [facility] provider you are looking for under [facility] and page to a number that you can be directly reached. 4. If you still have difficulty reaching the provider, please page the [facility] (Director on Call) for the Hospitalists listed on amion for assistance.  11/23/2020, 5:37 PM

## 2020-11-23 NOTE — ED Notes (Signed)
Blood bank has 1 unit of blood ready

## 2020-11-23 NOTE — Assessment & Plan Note (Signed)
The gross hematuria is resolved and she is still having dark urine. With new severe anemia she is directed to go to ER today for transfusion.

## 2020-11-23 NOTE — Assessment & Plan Note (Signed)
Does take xarelto which likely causing more bleeding after urological procedure. Hg checked yesterday at urology was 6.3. Given her history this is emergent for need for transfusion and she is directed to the ER today now. She will go straight there from visit. She is still having some darker urine without overt blood. Still having stomach pain and urology also stated that she had CT abdomen and pelvis without cause for the pain.

## 2020-11-23 NOTE — ED Notes (Signed)
Blood bank has 1 unit of blood ready. Notified Danielle,RN.

## 2020-11-24 DIAGNOSIS — Z7982 Long term (current) use of aspirin: Secondary | ICD-10-CM | POA: Diagnosis not present

## 2020-11-24 DIAGNOSIS — Z886 Allergy status to analgesic agent status: Secondary | ICD-10-CM | POA: Diagnosis not present

## 2020-11-24 DIAGNOSIS — I2510 Atherosclerotic heart disease of native coronary artery without angina pectoris: Secondary | ICD-10-CM | POA: Diagnosis present

## 2020-11-24 DIAGNOSIS — N179 Acute kidney failure, unspecified: Secondary | ICD-10-CM | POA: Diagnosis present

## 2020-11-24 DIAGNOSIS — D62 Acute posthemorrhagic anemia: Secondary | ICD-10-CM | POA: Diagnosis present

## 2020-11-24 DIAGNOSIS — E861 Hypovolemia: Secondary | ICD-10-CM | POA: Diagnosis present

## 2020-11-24 DIAGNOSIS — Z87892 Personal history of anaphylaxis: Secondary | ICD-10-CM | POA: Diagnosis not present

## 2020-11-24 DIAGNOSIS — I480 Paroxysmal atrial fibrillation: Secondary | ICD-10-CM | POA: Diagnosis present

## 2020-11-24 DIAGNOSIS — Z981 Arthrodesis status: Secondary | ICD-10-CM | POA: Diagnosis not present

## 2020-11-24 DIAGNOSIS — I5032 Chronic diastolic (congestive) heart failure: Secondary | ICD-10-CM

## 2020-11-24 DIAGNOSIS — E785 Hyperlipidemia, unspecified: Secondary | ICD-10-CM | POA: Diagnosis present

## 2020-11-24 DIAGNOSIS — Z888 Allergy status to other drugs, medicaments and biological substances status: Secondary | ICD-10-CM | POA: Diagnosis not present

## 2020-11-24 DIAGNOSIS — I252 Old myocardial infarction: Secondary | ICD-10-CM | POA: Diagnosis not present

## 2020-11-24 DIAGNOSIS — Z86711 Personal history of pulmonary embolism: Secondary | ICD-10-CM | POA: Diagnosis not present

## 2020-11-24 DIAGNOSIS — D649 Anemia, unspecified: Secondary | ICD-10-CM

## 2020-11-24 DIAGNOSIS — Z7901 Long term (current) use of anticoagulants: Secondary | ICD-10-CM | POA: Diagnosis not present

## 2020-11-24 DIAGNOSIS — I110 Hypertensive heart disease with heart failure: Secondary | ICD-10-CM | POA: Diagnosis present

## 2020-11-24 DIAGNOSIS — R310 Gross hematuria: Secondary | ICD-10-CM

## 2020-11-24 DIAGNOSIS — K219 Gastro-esophageal reflux disease without esophagitis: Secondary | ICD-10-CM | POA: Diagnosis present

## 2020-11-24 DIAGNOSIS — Z885 Allergy status to narcotic agent status: Secondary | ICD-10-CM | POA: Diagnosis not present

## 2020-11-24 DIAGNOSIS — Z91013 Allergy to seafood: Secondary | ICD-10-CM | POA: Diagnosis not present

## 2020-11-24 DIAGNOSIS — Z880 Allergy status to penicillin: Secondary | ICD-10-CM | POA: Diagnosis not present

## 2020-11-24 DIAGNOSIS — Z91040 Latex allergy status: Secondary | ICD-10-CM | POA: Diagnosis not present

## 2020-11-24 DIAGNOSIS — Z20822 Contact with and (suspected) exposure to covid-19: Secondary | ICD-10-CM | POA: Diagnosis present

## 2020-11-24 DIAGNOSIS — Z79899 Other long term (current) drug therapy: Secondary | ICD-10-CM | POA: Diagnosis not present

## 2020-11-24 LAB — TYPE AND SCREEN
ABO/RH(D): B POS
Antibody Screen: NEGATIVE
Unit division: 0
Unit division: 0

## 2020-11-24 LAB — BPAM RBC
Blood Product Expiration Date: 202202202359
Blood Product Expiration Date: 202202202359
ISSUE DATE / TIME: 202202011558
ISSUE DATE / TIME: 202202012030
Unit Type and Rh: 7300
Unit Type and Rh: 7300

## 2020-11-24 LAB — BASIC METABOLIC PANEL
Anion gap: 10 (ref 5–15)
BUN: 21 mg/dL (ref 8–23)
CO2: 22 mmol/L (ref 22–32)
Calcium: 9 mg/dL (ref 8.9–10.3)
Chloride: 105 mmol/L (ref 98–111)
Creatinine, Ser: 1.34 mg/dL — ABNORMAL HIGH (ref 0.44–1.00)
GFR, Estimated: 43 mL/min — ABNORMAL LOW (ref >60)
Glucose, Bld: 96 mg/dL (ref 70–99)
Potassium: 4.5 mmol/L (ref 3.5–5.1)
Sodium: 137 mmol/L (ref 135–145)

## 2020-11-24 LAB — CBC
HCT: 30 % — ABNORMAL LOW (ref 36.0–46.0)
Hemoglobin: 9.5 g/dL — ABNORMAL LOW (ref 12.0–15.0)
MCH: 28.4 pg (ref 26.0–34.0)
MCHC: 31.7 g/dL (ref 30.0–36.0)
MCV: 89.8 fL (ref 80.0–100.0)
Platelets: 350 10*3/uL (ref 150–400)
RBC: 3.34 MIL/uL — ABNORMAL LOW (ref 3.87–5.11)
RDW: 17 % — ABNORMAL HIGH (ref 11.5–15.5)
WBC: 6.1 10*3/uL (ref 4.0–10.5)
nRBC: 0 % (ref 0.0–0.2)

## 2020-11-24 LAB — SARS CORONAVIRUS 2 (TAT 6-24 HRS): SARS Coronavirus 2: NEGATIVE

## 2020-11-24 LAB — HIV ANTIBODY (ROUTINE TESTING W REFLEX): HIV Screen 4th Generation wRfx: NONREACTIVE

## 2020-11-24 MED ORDER — SODIUM CHLORIDE 0.9 % IV SOLN: INTRAVENOUS | Status: AC

## 2020-11-24 NOTE — Hospital Course (Signed)
70 year old woman PMH including PAF on chronic Xarelto, recent work-up for hematuria by Dr. Claudia Desanctis presented with shortness of breath, fatigue, dizziness.  Found to be anemic.  Admitted for symptomatic anemia.  A & P  Symptomatic anemia secondary to acute blood loss anemia secondary to gross hematuria which stopped approximately 5 days ago. --Hemoglobin appropriately increased status post 2 units PRBCs.  No hematuria now per patient for the last several days.  Symptoms resolving. --Xarelto and aspirin on hold. --If hemoglobin stable tomorrow likely discharge home with close outpatient follow-up with urology.  Acute kidney injury in the setting of hypovolemia secondary to acute blood loss anemia complicated by diuretic. --Much improved.  Expect spontaneous resolution.  PAF --Hold Xarelto and aspirin for now in the setting of acute blood loss anemia --Continue diltiazem  Chronic diastolic CHF --Stable.  Can resume Lasix on discharge.  CAD --Stable today.  Can likely resume aspirin tomorrow.

## 2020-11-24 NOTE — Care Management Obs Status (Signed)
[facility] NOTIFICATION   Patient Details  Name: Samantha Stevenson MRN: [id number] Date of Birth: [birth year]   Medicare Observation Status Notification Given:  Yes    Dessa Phi, RN 11/24/2020, 12:27 PM

## 2020-11-24 NOTE — Progress Notes (Signed)
PROGRESS NOTE  Samantha Stevenson YYT:[id number] DOB: 1951-04-22 DOA: 11/23/2020 PCP: Hoyt Koch, MD  Brief History   70 year old woman PMH including PAF on chronic Xarelto, recent work-up for hematuria by Dr. Claudia Desanctis presented with shortness of breath, fatigue, dizziness.  Found to be anemic.  Admitted for symptomatic anemia.  A & P  Symptomatic anemia secondary to acute blood loss anemia secondary to gross hematuria which stopped approximately 5 days ago. --Hemoglobin appropriately increased status post 2 units PRBCs.  No hematuria now per patient for the last several days.  Symptoms resolving. --Xarelto and aspirin on hold. --If hemoglobin stable tomorrow likely discharge home with close outpatient follow-up with urology.  Acute kidney injury in the setting of hypovolemia secondary to acute blood loss anemia complicated by diuretic. --Much improved.  Expect spontaneous resolution.  PAF --Hold Xarelto and aspirin for now in the setting of acute blood loss anemia --Continue diltiazem  Chronic diastolic CHF --Stable.  Can resume Lasix on discharge.  CAD --Stable today.  Can likely resume aspirin tomorrow.  Disposition Plan:  Discussion: as above  Dispo: The patient is from: Home              Anticipated d/c is to: Home              Anticipated d/c date is: 1 day              Patient currently is not medically stable to d/c.   Difficult to place patient No  DVT prophylaxis: SCDs Start: 11/23/20 1736 Place and maintain sequential compression device Start: 11/23/20 1402   Code Status: Full Code Level of care: Telemetry Family Communication: none  Murray Hodgkins, MD  [facility] Direct contact: see [email or web address] (further directions at bottom of note if needed) 7PM-7AM contact night coverage as at bottom of note 11/24/2020, 8:46 PM  LOS: 0 days   Significant Hospital Events   .    Consults:  .    Procedures:  .   Significant Diagnostic Tests:  Marland Kitchen    Micro  Data:  .    Antimicrobials:  .   Interval History/Subjective  CC: f/u hematuria  No hematuria for a few days, urine clear, no pain, breathing fine, feels a lot better  Objective   Vitals:  Vitals:   11/24/20 1409 11/24/20 1510  BP: (!) 81/41 (!) 93/53  Pulse: 75   Resp: 20   Temp: 98.6 F (37 C)   SpO2: 100%     Exam:  Constitutional:   . Appears calm and comfortable ENMT:  . grossly normal hearing  Respiratory:  . CTA bilaterally, no w/r/r.  . Respiratory effort normal. Cardiovascular:  . RRR, no m/r/g . No LE extremity edema   Psychiatric:  . Mental status o Mood, affect appropriate   I have personally reviewed the following:   Today's Data  . BMP creatinine improved to 1.34 . Hgb up to 9.5  Scheduled Meds: . diltiazem  240 mg Oral Daily  . pantoprazole  40 mg Oral Q1200  . rosuvastatin  20 mg Oral Daily  . sodium chloride flush  3 mL Intravenous Q12H   Continuous Infusions: . sodium chloride    . sodium chloride 75 mL/hr at 11/24/20 1718    Principal Problem:   Symptomatic anemia Active Problems:   Dyslipidemia   Atrial fibrillation (HCC)   Hypertension   Gross hematuria   Acute blood loss anemia   Chronic diastolic CHF (congestive heart failure) ([place])  LOS: 0 days   How to contact the [facility] Attending or Consulting provider [place] or covering provider during after hours [place], for this patient?  1. Check the care team in [facility] and look for a) attending/consulting TRH provider listed and b) the [facility] team listed 2. Log into [email or web address] and use [place]'s universal password to access. If you do not have the password, please contact the hospital operator. 3. Locate the [facility] provider you are looking for under [facility] and page to a number that you can be directly reached. 4. If you still have difficulty reaching the provider, please page the [facility] (Director on Call) for the Hospitalists listed on amion for assistance.

## 2020-11-25 DIAGNOSIS — D649 Anemia, unspecified: Secondary | ICD-10-CM | POA: Diagnosis not present

## 2020-11-25 DIAGNOSIS — D62 Acute posthemorrhagic anemia: Secondary | ICD-10-CM | POA: Diagnosis not present

## 2020-11-25 DIAGNOSIS — R310 Gross hematuria: Secondary | ICD-10-CM | POA: Diagnosis not present

## 2020-11-25 LAB — BASIC METABOLIC PANEL
Anion gap: 6 (ref 5–15)
BUN: 21 mg/dL (ref 8–23)
CO2: 25 mmol/L (ref 22–32)
Calcium: 8.8 mg/dL — ABNORMAL LOW (ref 8.9–10.3)
Chloride: 106 mmol/L (ref 98–111)
Creatinine, Ser: 1.4 mg/dL — ABNORMAL HIGH (ref 0.44–1.00)
GFR, Estimated: 41 mL/min — ABNORMAL LOW (ref >60)
Glucose, Bld: 103 mg/dL — ABNORMAL HIGH (ref 70–99)
Potassium: 4.3 mmol/L (ref 3.5–5.1)
Sodium: 137 mmol/L (ref 135–145)

## 2020-11-25 LAB — CBC
HCT: 29.1 % — ABNORMAL LOW (ref 36.0–46.0)
Hemoglobin: 9.1 g/dL — ABNORMAL LOW (ref 12.0–15.0)
MCH: 28.2 pg (ref 26.0–34.0)
MCHC: 31.3 g/dL (ref 30.0–36.0)
MCV: 90.1 fL (ref 80.0–100.0)
Platelets: 307 10*3/uL (ref 150–400)
RBC: 3.23 MIL/uL — ABNORMAL LOW (ref 3.87–5.11)
RDW: 17.2 % — ABNORMAL HIGH (ref 11.5–15.5)
WBC: 6 10*3/uL (ref 4.0–10.5)
nRBC: 0 % (ref 0.0–0.2)

## 2020-11-25 MED ORDER — FUROSEMIDE 20 MG PO TABS: 20.0000 mg | ORAL_TABLET | Freq: Every day | ORAL | Status: DC

## 2020-11-25 NOTE — Discharge Summary (Signed)
Physician Discharge Summary  Breella Filho [id number] DOB: 1951-07-03 DOA: 11/23/2020  PCP: Hoyt Koch, MD  Admit date: 11/23/2020 Discharge date: 11/25/2020  Recommendations for Outpatient Follow-up:  1. Hematuria and symptomatic anemia and acute kidney injury as below   Follow-up Information    Hoyt Koch, MD. Schedule an appointment as soon as possible for a visit in 1 week(s).   Specialty: Internal Medicine Contact information: [place] [address] [phone]                Discharge Diagnoses: Principal diagnosis is #1 Principal Problem:   Symptomatic anemia Active Problems:   Dyslipidemia   Atrial fibrillation (HCC)   Hypertension   Gross hematuria   Acute blood loss anemia   Chronic diastolic CHF (congestive heart failure) ([place])   Discharge Condition: improved Disposition: home  Diet recommendation:  Diet Orders (From admission, onward)    Start     Ordered   11/25/20 0000  Diet - low sodium heart healthy        11/25/20 1133   11/23/20 1737  Diet Heart Room service appropriate? Yes; Fluid consistency: Thin  Diet effective now       Question Answer Comment  Room service appropriate? Yes   Fluid consistency: Thin      11/23/20 1737           Filed Weights   11/23/20 1025  Weight: 72.1 kg    HPI/Hospital Course:   70 year old woman PMH including PAF on chronic Xarelto, recent work-up for hematuria by Dr. Claudia Desanctis presented with shortness of breath, fatigue, dizziness.  Found to be anemic.  Admitted for symptomatic anemia. Transfused 2 units PRBC with appropriate rise, no further bleeding. No intervention needed. Hospitalization uncomplicated, patient wishes to follow-up with her PCP rather than urology at this point. Discharged home in good condition.  A & P  Symptomatic anemia secondary to acute blood loss anemia secondary to gross hematuria which stopped approximately 5 days ago. --Hemoglobin  appropriately increased status post 2 units PRBCs.  No hematuria now per patient for the last several days. Now asymptomatic. --Xarelto and aspirin to resume on discharge.  Acute kidney injury in the setting of hypovolemia secondary to acute blood loss anemia complicated by diuretic. --Somewhat improved. Expect spontaneous resolution.  PAF --Stable. Resume Xarelto on discharge.  Chronic diastolic CHF --Stable.  Can resume Lasix next week.  CAD --Stable today. Resume aspirin tomorrow.  Today's assessment: S: CC: f/u anemia  Feels good, no complaints, no bleeding, no dizziness.  O: Vitals:  Vitals:   11/25/20 0545 11/25/20 1320  BP: 117/60 120/69  Pulse: 79 83  Resp: 18 18  Temp: 97.9 F (36.6 C) 98.3 F (36.8 C)  SpO2: 99% 100%    Constitutional:  . Appears calm and comfortable ENMT:  . grossly normal hearing  Respiratory:  . CTA bilaterally, no w/r/r.  . Respiratory effort normal. Cardiovascular:  . RRR, no m/r/g . No LE extremity edema   Psychiatric:  . Mental status o Mood, affect appropriate  Creatinine stable 1.4 Hemoglobin stable 9.1  Discharge Instructions  Discharge Instructions    Diet - low sodium heart healthy   Complete by: As directed    Discharge instructions   Complete by: As directed    Call your physician or seek immediate medical attention for pain, swelling, bleeding, weakness, dizziness or worsening of condition.   Increase activity slowly   Complete by: As directed    No wound  care   Complete by: As directed      Allergies as of 11/25/2020      Reactions   Penicillins Swelling, Other (See Comments)   Severe swelling PATIENT HAS HAD A PCN REACTION WITH IMMEDIATE RASH, FACIAL/TONGUE/THROAT SWELLING, SOB, OR LIGHTHEADEDNESS WITH HYPOTENSION:  #  #  YES  #  #  Has patient had a PCN reaction causing severe rash involving mucus membranes or skin necrosis: No PATIENT HAS HAD A PCN REACTION THAT REQUIRED HOSPITALIZATION:  #  #  YES  #  #   Has patient had a PCN reaction occurring within the last 10 years: No   Shellfish Allergy Anaphylaxis   Latex Hives, Rash   Banana Nausea Only   Kiwi Extract    Oxycodone    Pt stated, "Made me feel really drowsy" Tolerating as of 02/16/20 after neck surgery   Asa [aspirin] Nausea And Vomiting   Makes stomach upset Asprin 325   Celebrex [celecoxib] Nausea And Vomiting   GI upset   Nsaids Rash      Medication List    STOP taking these medications   alfuzosin 10 MG 24 hr tablet Commonly known as: UROXATRAL   HYDROcodone-acetaminophen 5-325 MG tablet Commonly known as: NORCO/VICODIN   nitrofurantoin (macrocrystal-monohydrate) 100 MG capsule Commonly known as: MACROBID   tiZANidine 4 MG tablet Commonly known as: Zanaflex     TAKE these medications   acetaminophen 500 MG tablet Commonly known as: TYLENOL Take 1,000 mg by mouth every 8 (eight) hours as needed for moderate pain.   acetaminophen-codeine 300-30 MG tablet Commonly known as: TYLENOL #3 Take 1-2 tablets by mouth every 8 (eight) hours as needed.   aspirin EC 81 MG tablet Take 81 mg by mouth daily.   diltiazem 240 MG 24 hr capsule Commonly known as: DILACOR XR TAKE 1 CAPSULE(240 MG) BY MOUTH EVERY MORNING What changed:   how much to take  how to take this  when to take this   ferrous sulfate 325 (65 FE) MG tablet Take 325 mg by mouth once a week.   furosemide 20 MG tablet Commonly known as: LASIX Take 1 tablet (20 mg total) by mouth daily. Hold until 2/7, start taking 2/7. What changed: additional instructions   multivitamin tablet Take 2 tablets by mouth daily. Gummies   ondansetron 4 MG tablet Commonly known as: ZOFRAN Take 4 mg by mouth as needed for nausea or vomiting.   oxybutynin 5 MG tablet Commonly known as: DITROPAN Take 1 tablet (5 mg total) by mouth every 8 (eight) hours as needed for bladder spasms.   rivaroxaban 20 MG Tabs tablet Commonly known as: Xarelto TAKE 1 TABLET(20  MG) BY MOUTH DAILY WITH SUPPER What changed:   how much to take  how to take this  when to take this  additional instructions   rosuvastatin 20 MG tablet Commonly known as: CRESTOR TAKE 1 TABLET(20 MG) BY MOUTH DAILY What changed:   how much to take  how to take this  when to take this  additional instructions      Allergies  Allergen Reactions  . Penicillins Swelling and Other (See Comments)    Severe swelling PATIENT HAS HAD A PCN REACTION WITH IMMEDIATE RASH, FACIAL/TONGUE/THROAT SWELLING, SOB, OR LIGHTHEADEDNESS WITH HYPOTENSION:  #  #  YES  #  #  Has patient had a PCN reaction causing severe rash involving mucus membranes or skin necrosis: No PATIENT HAS HAD A PCN REACTION THAT REQUIRED HOSPITALIZATION:  #  #  YES  #  #  Has patient had a PCN reaction occurring within the last 10 years: No  . Shellfish Allergy Anaphylaxis  . Latex Hives and Rash  . Banana Nausea Only  . Kiwi Extract   . Oxycodone     Pt stated, "Made me feel really drowsy" Tolerating as of 02/16/20 after neck surgery  . Asa [Aspirin] Nausea And Vomiting    Makes stomach upset Asprin 325  . Celebrex [Celecoxib] Nausea And Vomiting    GI upset  . Nsaids Rash    The results of significant diagnostics from this hospitalization (including imaging, microbiology, ancillary and laboratory) are listed below for reference.    Significant Diagnostic Studies:  Microbiology: Recent Results (from the past 240 hour(s))  SARS CORONAVIRUS 2 (TAT 6-24 HRS) Nasopharyngeal Nasopharyngeal Swab     Status: None   Collection Time: 11/23/20  1:43 PM   Specimen: Nasopharyngeal Swab  Result Value Ref Range Status   SARS Coronavirus 2 NEGATIVE NEGATIVE Final    Comment: (NOTE) SARS-CoV-2 target nucleic acids are NOT DETECTED.  The SARS-CoV-2 RNA is generally detectable in upper and lower respiratory specimens during the acute phase of infection. Negative results do not preclude SARS-CoV-2 infection, do not  rule out co-infections with other pathogens, and should not be used as the sole basis for treatment or other patient management decisions. Negative results must be combined with clinical observations, patient history, and epidemiological information. The expected result is Negative.  Fact Sheet for Patients: SugarRoll.be  Fact Sheet for Healthcare Providers: https://www.woods-mathews.com/  This test is not yet approved or cleared by the [place] FDA and  has been authorized for detection and/or diagnosis of SARS-CoV-2 by FDA under an Emergency Use Authorization (EUA). This EUA will remain  in effect (meaning this test can be used) for the duration of the COVID-19 declaration under Se ction 564(b)(1) of the Act, 21 U.S.C. section 360bbb-3(b)(1), unless the authorization is terminated or revoked sooner.  Performed at [facility], [place] [address] [place], [place] [address]      Labs: Basic Metabolic Panel: Recent Labs  Lab 11/23/20 1028 11/23/20 1557 11/24/20 0611 11/25/20 0508  NA 141  --  137 137  K 4.5  --  4.5 4.3  CL 108  --  105 106  CO2 22  --  22 25  GLUCOSE 109*  --  96 103*  BUN 33*  --  21 21  CREATININE 1.66*  --  1.34* 1.40*  CALCIUM 9.4  --  9.0 8.8*  MG  --  2.1  --   --   PHOS  --  3.5  --   --    Liver Function Tests: Recent Labs  Lab 11/23/20 1028  AST 24  ALT 20  ALKPHOS 76  BILITOT 0.6  PROT 7.2  ALBUMIN 3.6  CBC: Recent Labs  Lab 11/23/20 1028 11/24/20 0611 11/25/20 0508  WBC 6.1 6.1 6.0  HGB 6.5* 9.5* 9.1*  HCT 21.7* 30.0* 29.1*  MCV 91.2 89.8 90.1  PLT 442* 350 307    Principal Problem:   Symptomatic anemia Active Problems:   Dyslipidemia   Atrial fibrillation (HCC)   Hypertension   Gross hematuria   Acute blood loss anemia   Chronic diastolic CHF (congestive heart failure) ([place])   Time coordinating discharge: 25 minutes  Signed:  Murray Hodgkins, MD  [facility]  11/25/2020, 7:03 PM

## 2020-11-26 ENCOUNTER — Telehealth: Payer: Self-pay

## 2020-11-26 NOTE — Telephone Encounter (Signed)
Transition Care Management Follow-up Telephone Call  Date of discharge and from where: 11/25/2020 from [facility]  How have you been since you were released from the hospital? Doing much better  Any questions or concerns? No  Items Reviewed:  Did the pt receive and understand the discharge instructions provided? Yes   Medications obtained and verified? No   Other? No   Any new allergies since your discharge? No   Dietary orders reviewed? Yes Heart healthy diet Do you have support at home? Yes   Daughter  Home Care and Equipment/Supplies: Were home health services ordered? no If so, what is the name of the agency? n/a  Has the agency set up a time to come to the patient's home? no Were any new equipment or medical supplies ordered?  No What is the name of the medical supply agency? n/a Were you able to get the supplies/equipment? no Do you have any questions related to the use of the equipment or supplies? No  Functional Questionnaire: (I = Independent and D = Dependent) ADLs: I  Bathing/Dressing- I  Meal Prep- I  Eating- I  Maintaining continence- I  Transferring/Ambulation- I  Managing Meds- I  Follow up appointments reviewed:   PCP Hospital f/u appt confirmed? Yes  Scheduled to see Pricilla Holm, MD on 12/02/2020 @ 1:00 pm  Specialist Hospital f/u appt confirmed? Yes  Scheduled to see Christinia Gully, MD on 12/07/2020 @ 10:00 am.  Are transportation arrangements needed? No   If their condition worsens, is the pt aware to call PCP or go to the Emergency Dept.? Yes  Was the patient provided with contact information for the PCP's office or ED? Yes  Was to pt encouraged to call back with questions or concerns? Yes

## 2020-12-02 ENCOUNTER — Encounter: Payer: Self-pay | Admitting: Internal Medicine

## 2020-12-02 ENCOUNTER — Ambulatory Visit (INDEPENDENT_AMBULATORY_CARE_PROVIDER_SITE_OTHER): Payer: Medicare Other | Admitting: Internal Medicine

## 2020-12-02 ENCOUNTER — Other Ambulatory Visit: Payer: Self-pay

## 2020-12-02 VITALS — BP 128/88 | HR 99 | Temp 98.1°F | Resp 18 | Ht 64.0 in | Wt 164.4 lb

## 2020-12-02 DIAGNOSIS — D62 Acute posthemorrhagic anemia: Secondary | ICD-10-CM | POA: Diagnosis not present

## 2020-12-02 DIAGNOSIS — I5032 Chronic diastolic (congestive) heart failure: Secondary | ICD-10-CM | POA: Diagnosis not present

## 2020-12-02 DIAGNOSIS — R310 Gross hematuria: Secondary | ICD-10-CM

## 2020-12-02 DIAGNOSIS — Z86711 Personal history of pulmonary embolism: Secondary | ICD-10-CM

## 2020-12-02 LAB — COMPREHENSIVE METABOLIC PANEL
ALT: 11 U/L (ref 0–35)
AST: 19 U/L (ref 0–37)
Albumin: 3.8 g/dL (ref 3.5–5.2)
Alkaline Phosphatase: 93 U/L (ref 39–117)
BUN: 24 mg/dL — ABNORMAL HIGH (ref 6–23)
CO2: 29 mEq/L (ref 19–32)
Calcium: 9.7 mg/dL (ref 8.4–10.5)
Chloride: 105 mEq/L (ref 96–112)
Creatinine, Ser: 1.13 mg/dL (ref 0.40–1.20)
GFR: 50.19 mL/min — ABNORMAL LOW (ref >60.00)
Glucose, Bld: 81 mg/dL (ref 70–99)
Potassium: 4.1 mEq/L (ref 3.5–5.1)
Sodium: 140 mEq/L (ref 135–145)
Total Bilirubin: 0.5 mg/dL (ref 0.2–1.2)
Total Protein: 7 g/dL (ref 6.0–8.3)

## 2020-12-02 LAB — CBC
HCT: 30.5 % — ABNORMAL LOW (ref 36.0–46.0)
Hemoglobin: 10 g/dL — ABNORMAL LOW (ref 12.0–15.0)
MCHC: 32.7 g/dL (ref 30.0–36.0)
MCV: 85.7 fl (ref 78.0–100.0)
Platelets: 278 10*3/uL (ref 150.0–400.0)
RBC: 3.56 Mil/uL — ABNORMAL LOW (ref 3.87–5.11)
RDW: 17.1 % — ABNORMAL HIGH (ref 11.5–15.5)
WBC: 4.8 10*3/uL (ref 4.0–10.5)

## 2020-12-02 NOTE — Patient Instructions (Signed)
We will check the labs today and let you know about the results.    

## 2020-12-02 NOTE — Progress Notes (Signed)
   Subjective:   Patient ID: Samantha Stevenson, female    DOB: Jan 24, 1951, 70 y.o.   MRN: [id number]  HPI The patient is a 71 YO female coming in for hospital follow up (D/C summary reviewed was sent to hospital by me for low blood counts with urology due to hematuria, she did have 2 units PRBC and monitoring to ensure not dropping, stable Hg around 9-9.5 when leaving the hospital, some RBC in U/A but gross hematuria had resolved). She is still planning for left knee scope later this month. She is feeling much better than at last visit. Her SOB is almost gone. Her chest tightness is resolved. Her energy levels are not 100% but much improved. No nausea. Denies constipation or diarrhea. She denies blood in urine although it still does not look normal is slightly dark. No more abdominal pain which she had previously been having.   PMH, [facility], social history reviewed and updated. Med reconciliation done during visit and compared to D/C list.  Review of Systems  Constitutional: Negative.   HENT: Negative.   Eyes: Negative.   Respiratory: Negative for cough, chest tightness and shortness of breath.   Cardiovascular: Negative for chest pain, palpitations and leg swelling.  Gastrointestinal: Negative for abdominal distention, abdominal pain, constipation, diarrhea, nausea and vomiting.  Musculoskeletal: Negative.   Skin: Negative.   Neurological: Negative.   Psychiatric/Behavioral: Negative.     Objective:  Physical Exam Constitutional:      Appearance: She is well-developed and well-nourished.  HENT:     Head: Normocephalic and atraumatic.  Eyes:     Extraocular Movements: EOM normal.  Cardiovascular:     Rate and Rhythm: Normal rate and regular rhythm.  Pulmonary:     Effort: Pulmonary effort is normal. No respiratory distress.     Breath sounds: Normal breath sounds. No wheezing or rales.  Abdominal:     General: Bowel sounds are normal. There is no distension.     Palpations: Abdomen is  soft.     Tenderness: There is no abdominal tenderness. There is no rebound.  Musculoskeletal:        General: No edema.     Cervical back: Normal range of motion.  Skin:    General: Skin is warm and dry.  Neurological:     Mental Status: She is alert and oriented to person, place, and time.     Coordination: Coordination normal.  Psychiatric:        Mood and Affect: Mood and affect normal.     Vitals:   12/02/20 1258  BP: 128/88  Pulse: 99  Resp: 18  Temp: 98.1 F (36.7 C)  TempSrc: Oral  SpO2: 100%  Weight: 164 lb 6.4 oz (74.6 kg)  Height: 5\' 4"  (1.626 m)    This visit occurred during the SARS-CoV-2 public health emergency.  Safety protocols were in place, including screening questions prior to the visit, additional usage of staff PPE, and extensive cleaning of exam room while observing appropriate contact time as indicated for disinfecting solutions.   Assessment & Plan:

## 2020-12-03 NOTE — Assessment & Plan Note (Signed)
She is symptomatically better with the blood transfusion. We discussed how this will take several months to get back to normal. As long as this is stable she should still be able to have left knee scope later this month. Her symptoms of anemia including SOB and chest tightness are resolved at this time. Checking CBC today as well as CMP.

## 2020-12-03 NOTE — Assessment & Plan Note (Signed)
No flare today. Checking CMP and adjust as needed her lasix. She is back on this.

## 2020-12-03 NOTE — Assessment & Plan Note (Signed)
Resolved at this time.  °

## 2020-12-03 NOTE — Assessment & Plan Note (Signed)
Back on her xarelto and no active bleeding noted. Checking CBC today.

## 2020-12-06 ENCOUNTER — Telehealth: Payer: Self-pay | Admitting: Internal Medicine

## 2020-12-06 NOTE — Telephone Encounter (Signed)
Patient is requesting a return to work note. She was seen on 2.10.22. Please advise.

## 2020-12-06 NOTE — Telephone Encounter (Signed)
See below

## 2020-12-07 ENCOUNTER — Ambulatory Visit: Payer: Medicare Other | Admitting: Internal Medicine

## 2020-12-07 NOTE — Telephone Encounter (Signed)
Work note done

## 2020-12-07 NOTE — Telephone Encounter (Signed)
Patient has been notified via mychart message

## 2020-12-08 ENCOUNTER — Other Ambulatory Visit: Payer: Self-pay

## 2020-12-08 ENCOUNTER — Telehealth: Payer: Self-pay

## 2020-12-08 ENCOUNTER — Encounter (HOSPITAL_BASED_OUTPATIENT_CLINIC_OR_DEPARTMENT_OTHER): Payer: Self-pay | Admitting: Orthopaedic Surgery

## 2020-12-08 NOTE — Telephone Encounter (Signed)
   [facility] Pre-operative Risk Assessment    HEARTCARE STAFF: - Please ensure there is not already an duplicate clearance open for this procedure. - Under Visit Info/Reason for Call, type in Other and utilize the format Clearance MM/DD/YY or Clearance TBD. Do not use dashes or single digits. - If request is for dental extraction, please clarify the # of teeth to be extracted.  Request for surgical clearance:  1. What type of surgery is being performed? Left knee arthroscopy   2. When is this surgery scheduled? TBD   3. What type of clearance is required (medical clearance vs. Pharmacy clearance to hold med vs. Both)? both  4. Are there any medications that need to be held prior to surgery and how long?  Blood thinner  5. Practice name and name of physician performing surgery? [facility] Hoots, Jean Rosenthal, MD   6. What is the office phone number? [phone]   7.   What is the office fax number? [phone]  8.   Anesthesia type (None, local, MAC, general) ? General    Samantha Stevenson Samantha Stevenson 12/08/2020, 2:32 PM  _________________________________________________________________   (provider comments below)

## 2020-12-09 ENCOUNTER — Other Ambulatory Visit: Payer: Self-pay | Admitting: Physician Assistant

## 2020-12-09 NOTE — Telephone Encounter (Signed)
Patient with diagnosis of afib on Xarelto for anticoagulation.    Procedure: left knee arthroscopy Date of procedure: TBD  CHA2DS2-VASc Score = 5  This indicates a 7.2% annual risk of stroke. The patient's score is based upon: CHF History: Yes HTN History: Yes Diabetes History: No Stroke History: No Vascular Disease History: Yes Age Score: 1 Gender Score: 1  Did also have a provoked PE in 2014 one week after hip replacement - this was treated with anticoag for 6 months then stopped. Anticoag not resumed later until afib dx.  CrCl 47mL/min Platelet count 278K  Per office protocol, patient can hold Xarelto for 2-3 days prior to procedure. Pt was recently admitted for hematuria and symptomatic anemia 11/23/20, may be worth seeing if she's interested in changing to Eliquis due to ~50% relative lower bleed risk.

## 2020-12-10 NOTE — Telephone Encounter (Signed)
   Primary Cardiologist: Will Meredith Leeds, MD  Chart reviewed as part of pre-operative protocol coverage. Patient was contacted 12/10/2020 in reference to pre-operative risk assessment for pending surgery as outlined below.  Samantha Stevenson was last seen on 05/04/20 by Dr. Curt Bears.  Since that day, Samantha Stevenson has done well.  Therefore, based on ACC/AHA guidelines, the patient would be at acceptable risk for the planned procedure without further cardiovascular testing.   Per pharmacy review may hold Xarelto 2-3 days prior to planned procedure. She verbalized understanding of these directions. She is not interested in transitioning to Eliquis.   The patient was advised that if she develops new symptoms prior to surgery to contact our office to arrange for a follow-up visit, and she verbalized understanding.  I will route this recommendation to the requesting party via Epic fax function and remove from pre-op pool. Please call with questions.  Loel Dubonnet, NP 12/10/2020, 12:26 PM

## 2020-12-10 NOTE — Telephone Encounter (Signed)
Called patient. Left VM requesting call back or that she respond to MyChart message.   Loel Dubonnet, NP

## 2020-12-13 ENCOUNTER — Other Ambulatory Visit (HOSPITAL_COMMUNITY)
Admission: RE | Admit: 2020-12-13 | Discharge: 2020-12-13 | Disposition: A | Discharge status: Home / Self Care | Payer: Medicare Other | Source: Ambulatory Visit | Attending: Orthopaedic Surgery | Admitting: Orthopaedic Surgery

## 2020-12-13 DIAGNOSIS — Z20822 Contact with and (suspected) exposure to covid-19: Secondary | ICD-10-CM | POA: Diagnosis not present

## 2020-12-13 DIAGNOSIS — Z01812 Encounter for preprocedural laboratory examination: Secondary | ICD-10-CM | POA: Insufficient documentation

## 2020-12-13 LAB — SARS CORONAVIRUS 2 (TAT 6-24 HRS): SARS Coronavirus 2: NEGATIVE

## 2020-12-16 ENCOUNTER — Encounter (HOSPITAL_BASED_OUTPATIENT_CLINIC_OR_DEPARTMENT_OTHER)
Admission: RE | Disposition: A | Discharge status: Home / Self Care | Payer: Self-pay | Source: Home / Self Care | Attending: Orthopaedic Surgery

## 2020-12-16 ENCOUNTER — Ambulatory Visit (HOSPITAL_BASED_OUTPATIENT_CLINIC_OR_DEPARTMENT_OTHER): Payer: Medicare Other | Admitting: Anesthesiology

## 2020-12-16 ENCOUNTER — Ambulatory Visit (HOSPITAL_BASED_OUTPATIENT_CLINIC_OR_DEPARTMENT_OTHER)
Admission: RE | Admit: 2020-12-16 | Discharge: 2020-12-16 | Disposition: A | Discharge status: Home / Self Care | Payer: Medicare Other | Attending: Orthopaedic Surgery | Admitting: Orthopaedic Surgery

## 2020-12-16 ENCOUNTER — Other Ambulatory Visit: Payer: Self-pay

## 2020-12-16 ENCOUNTER — Encounter (HOSPITAL_BASED_OUTPATIENT_CLINIC_OR_DEPARTMENT_OTHER): Payer: Self-pay | Admitting: Orthopaedic Surgery

## 2020-12-16 DIAGNOSIS — I252 Old myocardial infarction: Secondary | ICD-10-CM | POA: Insufficient documentation

## 2020-12-16 DIAGNOSIS — Z880 Allergy status to penicillin: Secondary | ICD-10-CM | POA: Diagnosis not present

## 2020-12-16 DIAGNOSIS — Z886 Allergy status to analgesic agent status: Secondary | ICD-10-CM | POA: Insufficient documentation

## 2020-12-16 DIAGNOSIS — Z803 Family history of malignant neoplasm of breast: Secondary | ICD-10-CM | POA: Diagnosis not present

## 2020-12-16 DIAGNOSIS — I4891 Unspecified atrial fibrillation: Secondary | ICD-10-CM | POA: Insufficient documentation

## 2020-12-16 DIAGNOSIS — Z91040 Latex allergy status: Secondary | ICD-10-CM | POA: Insufficient documentation

## 2020-12-16 DIAGNOSIS — Z7901 Long term (current) use of anticoagulants: Secondary | ICD-10-CM | POA: Insufficient documentation

## 2020-12-16 DIAGNOSIS — Z79899 Other long term (current) drug therapy: Secondary | ICD-10-CM | POA: Insufficient documentation

## 2020-12-16 DIAGNOSIS — D689 Coagulation defect, unspecified: Secondary | ICD-10-CM | POA: Diagnosis not present

## 2020-12-16 DIAGNOSIS — Z91018 Allergy to other foods: Secondary | ICD-10-CM | POA: Insufficient documentation

## 2020-12-16 DIAGNOSIS — X58XXXA Exposure to other specified factors, initial encounter: Secondary | ICD-10-CM | POA: Insufficient documentation

## 2020-12-16 DIAGNOSIS — Z885 Allergy status to narcotic agent status: Secondary | ICD-10-CM | POA: Insufficient documentation

## 2020-12-16 DIAGNOSIS — Z833 Family history of diabetes mellitus: Secondary | ICD-10-CM | POA: Diagnosis not present

## 2020-12-16 DIAGNOSIS — Z888 Allergy status to other drugs, medicaments and biological substances status: Secondary | ICD-10-CM | POA: Diagnosis not present

## 2020-12-16 DIAGNOSIS — Z7982 Long term (current) use of aspirin: Secondary | ICD-10-CM | POA: Diagnosis not present

## 2020-12-16 DIAGNOSIS — Z981 Arthrodesis status: Secondary | ICD-10-CM | POA: Diagnosis not present

## 2020-12-16 DIAGNOSIS — Z8249 Family history of ischemic heart disease and other diseases of the circulatory system: Secondary | ICD-10-CM | POA: Diagnosis not present

## 2020-12-16 DIAGNOSIS — D62 Acute posthemorrhagic anemia: Secondary | ICD-10-CM | POA: Diagnosis not present

## 2020-12-16 DIAGNOSIS — Z8349 Family history of other endocrine, nutritional and metabolic diseases: Secondary | ICD-10-CM | POA: Insufficient documentation

## 2020-12-16 DIAGNOSIS — Z86711 Personal history of pulmonary embolism: Secondary | ICD-10-CM | POA: Insufficient documentation

## 2020-12-16 DIAGNOSIS — S83272A Complex tear of lateral meniscus, current injury, left knee, initial encounter: Secondary | ICD-10-CM | POA: Diagnosis not present

## 2020-12-16 DIAGNOSIS — S83282D Other tear of lateral meniscus, current injury, left knee, subsequent encounter: Secondary | ICD-10-CM | POA: Diagnosis not present

## 2020-12-16 DIAGNOSIS — E785 Hyperlipidemia, unspecified: Secondary | ICD-10-CM | POA: Diagnosis not present

## 2020-12-16 DIAGNOSIS — I110 Hypertensive heart disease with heart failure: Secondary | ICD-10-CM | POA: Insufficient documentation

## 2020-12-16 DIAGNOSIS — I509 Heart failure, unspecified: Secondary | ICD-10-CM | POA: Diagnosis not present

## 2020-12-16 DIAGNOSIS — Z91013 Allergy to seafood: Secondary | ICD-10-CM | POA: Diagnosis not present

## 2020-12-16 DIAGNOSIS — S83282A Other tear of lateral meniscus, current injury, left knee, initial encounter: Secondary | ICD-10-CM | POA: Diagnosis not present

## 2020-12-16 DIAGNOSIS — Y939 Activity, unspecified: Secondary | ICD-10-CM | POA: Diagnosis not present

## 2020-12-16 HISTORY — PX: KNEE ARTHROSCOPY: SHX127

## 2020-12-16 SURGERY — ARTHROSCOPY, KNEE
Anesthesia: General | Site: Knee | Laterality: Left

## 2020-12-16 MED ORDER — BUPIVACAINE HCL (PF) 0.25 % IJ SOLN
INTRAMUSCULAR | Status: DC | PRN
  Administered 2020-12-16: 20 mL

## 2020-12-16 MED ORDER — FENTANYL CITRATE (PF) 100 MCG/2ML IJ SOLN
INTRAMUSCULAR | Status: AC
  Filled 2020-12-16: qty 2

## 2020-12-16 MED ORDER — HYDROCODONE-ACETAMINOPHEN 5-325 MG PO TABS: 1.0000 | ORAL_TABLET | Freq: Four times a day (QID) | ORAL | 0 refills | Status: DC | PRN

## 2020-12-16 MED ORDER — CLINDAMYCIN PHOSPHATE 900 MG/50ML IV SOLN
900.0000 mg | INTRAVENOUS | Status: AC
  Administered 2020-12-16: 900 mg via INTRAVENOUS

## 2020-12-16 MED ORDER — BUPIVACAINE HCL (PF) 0.25 % IJ SOLN
INTRAMUSCULAR | Status: AC
  Filled 2020-12-16: qty 30

## 2020-12-16 MED ORDER — LIDOCAINE 2% (20 MG/ML) 5 ML SYRINGE
INTRAMUSCULAR | Status: AC
  Filled 2020-12-16: qty 5

## 2020-12-16 MED ORDER — MIDAZOLAM HCL 5 MG/5ML IJ SOLN
INTRAMUSCULAR | Status: DC | PRN
  Administered 2020-12-16: 2 mg via INTRAVENOUS

## 2020-12-16 MED ORDER — PHENYLEPHRINE 40 MCG/ML (10ML) SYRINGE FOR IV PUSH (FOR BLOOD PRESSURE SUPPORT)
PREFILLED_SYRINGE | INTRAVENOUS | Status: AC
  Filled 2020-12-16: qty 10

## 2020-12-16 MED ORDER — LIDOCAINE 2% (20 MG/ML) 5 ML SYRINGE
INTRAMUSCULAR | Status: DC | PRN
  Administered 2020-12-16: 60 mg via INTRAVENOUS

## 2020-12-16 MED ORDER — MIDAZOLAM HCL 2 MG/2ML IJ SOLN
INTRAMUSCULAR | Status: AC
  Filled 2020-12-16: qty 2

## 2020-12-16 MED ORDER — CLINDAMYCIN PHOSPHATE 900 MG/50ML IV SOLN
INTRAVENOUS | Status: AC
  Filled 2020-12-16: qty 50

## 2020-12-16 MED ORDER — DEXAMETHASONE SODIUM PHOSPHATE 10 MG/ML IJ SOLN
INTRAMUSCULAR | Status: AC
  Filled 2020-12-16: qty 1

## 2020-12-16 MED ORDER — DEXAMETHASONE SODIUM PHOSPHATE 10 MG/ML IJ SOLN
INTRAMUSCULAR | Status: DC | PRN
  Administered 2020-12-16: 10 mg via INTRAVENOUS

## 2020-12-16 MED ORDER — PHENYLEPHRINE 40 MCG/ML (10ML) SYRINGE FOR IV PUSH (FOR BLOOD PRESSURE SUPPORT)
PREFILLED_SYRINGE | INTRAVENOUS | Status: DC | PRN
  Administered 2020-12-16 (×2): 120 ug via INTRAVENOUS
  Administered 2020-12-16 (×2): 80 ug via INTRAVENOUS

## 2020-12-16 MED ORDER — ONDANSETRON HCL 4 MG/2ML IJ SOLN
INTRAMUSCULAR | Status: AC
  Filled 2020-12-16: qty 2

## 2020-12-16 MED ORDER — ONDANSETRON HCL 4 MG/2ML IJ SOLN
INTRAMUSCULAR | Status: DC | PRN
  Administered 2020-12-16: 4 mg via INTRAVENOUS

## 2020-12-16 MED ORDER — PROPOFOL 10 MG/ML IV BOLUS
INTRAVENOUS | Status: DC | PRN
  Administered 2020-12-16: 160 mg via INTRAVENOUS

## 2020-12-16 MED ORDER — FENTANYL CITRATE (PF) 100 MCG/2ML IJ SOLN
INTRAMUSCULAR | Status: DC | PRN
  Administered 2020-12-16 (×3): 25 ug via INTRAVENOUS
  Administered 2020-12-16: 50 ug via INTRAVENOUS

## 2020-12-16 MED ORDER — MORPHINE SULFATE (PF) 4 MG/ML IV SOLN
INTRAVENOUS | Status: DC | PRN
  Administered 2020-12-16: 4 mg via INTRAMUSCULAR

## 2020-12-16 MED ORDER — ONDANSETRON 4 MG PO TBDP: 4.0000 mg | ORAL_TABLET | Freq: Three times a day (TID) | ORAL | 0 refills | Status: DC | PRN

## 2020-12-16 MED ORDER — EPHEDRINE 5 MG/ML INJ
INTRAVENOUS | Status: AC
  Filled 2020-12-16: qty 10

## 2020-12-16 MED ORDER — PROPOFOL 500 MG/50ML IV EMUL
INTRAVENOUS | Status: AC
  Filled 2020-12-16: qty 50

## 2020-12-16 MED ORDER — FENTANYL CITRATE (PF) 100 MCG/2ML IJ SOLN: 25.0000 ug | INTRAMUSCULAR | Status: DC | PRN

## 2020-12-16 MED ORDER — MORPHINE SULFATE (PF) 4 MG/ML IV SOLN
INTRAVENOUS | Status: AC
  Filled 2020-12-16: qty 1

## 2020-12-16 MED ORDER — ONDANSETRON HCL 4 MG/2ML IJ SOLN: 4.0000 mg | Freq: Four times a day (QID) | INTRAMUSCULAR | Status: DC | PRN

## 2020-12-16 MED ORDER — LACTATED RINGERS IV SOLN: INTRAVENOUS | Status: DC

## 2020-12-16 SURGICAL SUPPLY — 33 items
BLADE EXCALIBUR 4.0X13 (MISCELLANEOUS) IMPLANT
BNDG ELASTIC 6X5.8 VLCR STR LF (GAUZE/BANDAGES/DRESSINGS) ×2 IMPLANT
COVER WAND RF STERILE (DRAPES) IMPLANT
DISSECTOR  3.8MM X 13CM (MISCELLANEOUS)
DISSECTOR 3.8MM X 13CM (MISCELLANEOUS) IMPLANT
DRAPE ARTHROSCOPY W/POUCH 90 (DRAPES) ×2 IMPLANT
DRAPE U-SHAPE 47X51 STRL (DRAPES) ×2 IMPLANT
DRSG PAD ABDOMINAL 8X10 ST (GAUZE/BANDAGES/DRESSINGS) ×2 IMPLANT
ELECT MENISCUS 165MM 90D (ELECTRODE) IMPLANT
ELECT REM PT RETURN 9FT ADLT (ELECTROSURGICAL)
ELECTRODE REM PT RTRN 9FT ADLT (ELECTROSURGICAL) IMPLANT
EXCALIBUR 3.8MM X 13CM (MISCELLANEOUS) IMPLANT
GAUZE SPONGE 4X4 12PLY STRL (GAUZE/BANDAGES/DRESSINGS) ×2 IMPLANT
GAUZE XEROFORM 1X8 LF (GAUZE/BANDAGES/DRESSINGS) ×2 IMPLANT
GLOVE SRG 8 PF TXTR STRL LF DI (GLOVE) ×2 IMPLANT
GLOVE SURG SS PI 7.5 STRL IVOR (GLOVE) ×3 IMPLANT
GLOVE SURG UNDER POLY LF SZ7 (GLOVE) ×2 IMPLANT
GLOVE SURG UNDER POLY LF SZ8 (GLOVE) ×4
GOWN STRL REUS W/ TWL LRG LVL3 (GOWN DISPOSABLE) ×2 IMPLANT
GOWN STRL REUS W/ TWL XL LVL3 (GOWN DISPOSABLE) ×1 IMPLANT
GOWN STRL REUS W/TWL LRG LVL3 (GOWN DISPOSABLE) ×4
GOWN STRL REUS W/TWL XL LVL3 (GOWN DISPOSABLE) ×2
MANIFOLD NEPTUNE II (INSTRUMENTS) IMPLANT
PACK ARTHROSCOPY DSU (CUSTOM PROCEDURE TRAY) ×2 IMPLANT
PACK BASIN DAY SURGERY FS (CUSTOM PROCEDURE TRAY) ×2 IMPLANT
PADDING CAST COTTON 6X4 STRL (CAST SUPPLIES) ×2 IMPLANT
PENCIL SMOKE EVACUATOR (MISCELLANEOUS) IMPLANT
SUT ETHILON 3 0 PS 1 (SUTURE) ×2 IMPLANT
TOWEL GREEN STERILE FF (TOWEL DISPOSABLE) ×2 IMPLANT
TUBING ARTHROSCOPY IRRIG 16FT (MISCELLANEOUS) ×2 IMPLANT
WATER STERILE IRR 1000ML POUR (IV SOLUTION) ×2 IMPLANT
WIPE CHG CHLORHEXIDINE 2% (PERSONAL CARE ITEMS) ×1 IMPLANT
WRAP KNEE MAXI GEL POST OP (GAUZE/BANDAGES/DRESSINGS) ×2 IMPLANT

## 2020-12-16 NOTE — Anesthesia Preprocedure Evaluation (Signed)
Anesthesia Evaluation  Patient identified by MRN, date of birth, ID band Patient awake    Reviewed: Allergy & Precautions, H&P , NPO status , Patient's Chart, lab work & pertinent test results  Airway Mallampati: II   Neck ROM: full    Dental   Pulmonary neg pulmonary ROS,    breath sounds clear to auscultation       Cardiovascular hypertension, + Past MI and +CHF  + dysrhythmias Atrial Fibrillation  Rhythm:regular Rate:Normal     Neuro/Psych  Headaches,  Neuromuscular disease    GI/Hepatic   Endo/Other    Renal/GU      Musculoskeletal  (+) Arthritis ,   Abdominal   Peds  Hematology  (+) Blood dyscrasia, anemia ,   Anesthesia Other Findings   Reproductive/Obstetrics                             Anesthesia Physical Anesthesia Plan  ASA: III  Anesthesia Plan: General   Post-op Pain Management:    Induction: Intravenous  PONV Risk Score and Plan: 3 and Ondansetron, Dexamethasone, Treatment may vary due to age or medical condition and Midazolam  Airway Management Planned: LMA  Additional Equipment:   Intra-op Plan:   Post-operative Plan: Extubation in OR  Informed Consent: I have reviewed the patients History and Physical, chart, labs and discussed the procedure including the risks, benefits and alternatives for the proposed anesthesia with the patient or authorized representative who has indicated his/her understanding and acceptance.     Dental advisory given  Plan Discussed with: CRNA, Anesthesiologist and Surgeon  Anesthesia Plan Comments:         Anesthesia Quick Evaluation

## 2020-12-16 NOTE — Anesthesia Postprocedure Evaluation (Signed)
Anesthesia Post Note  Patient: Samantha Stevenson  Procedure(s) Performed: LEFT KNEE ARTHROSCOPY WITH PARTIAL LATERAL MENISCECTOMY (Left Knee)     Patient location during evaluation: PACU Anesthesia Type: General Level of consciousness: awake and alert Pain management: pain level controlled Vital Signs Assessment: post-procedure vital signs reviewed and stable Respiratory status: spontaneous breathing, nonlabored ventilation, respiratory function stable and patient connected to nasal cannula oxygen Cardiovascular status: blood pressure returned to baseline and stable Postop Assessment: no apparent nausea or vomiting Anesthetic complications: no   No complications documented.  Last Vitals:  Vitals:   12/16/20 0830 12/16/20 0852  BP: 110/89 (!) 133/57  Pulse: 88 91  Resp: 12 14  Temp:  36.5 C  SpO2: 100% 100%    Last Pain:  Vitals:   12/16/20 0852  TempSrc:   PainSc: 0-No pain                 Elise Gladden S

## 2020-12-16 NOTE — H&P (Signed)
Samantha Stevenson is an 70 y.o. female.   Chief Complaint: Left knee pain and swelling with catching HPI:   70 yo female with left knee pain, locking, catching and swelling.  Failed conservative treatment.  A MRI of the left knee shows a complex lateral meniscal tear.  At this point, left knee arthroscopy has been recommended.  Past Medical History:  Diagnosis Date  . Allergy   . Anemia   . Arthritis    "qwhere" (07/29/2018)  . Atrial fibrillation ([place])   . CHF (congestive heart failure) ([place])   . Clotting disorder ([place])   . Degenerative arthritis of hip    s/p L THR 12/2012  . Dyspnea    since surgery in August 2019- "when I get worked up and walk a short distance"  . Dysrhythmia   . History of blood transfusion 05/2018   "related to OR"  . Hyperlipidemia   . Hypertension   . Intramural hematoma of thoracic aorta ([place]) 06/13/2018  . Migraines    mIgraines- none since blood pressure and lipids are under control  . Myocardial infarction ([place]) 2000   due to atrial fib  . Neuromuscular disorder ([place])    pinched nerve- left side of neck  . Pulmonary emboli ([place]) 12/2012 dx   post op (L THR), anticoag x 86mo    Past Surgical History:  Procedure Laterality Date  . ABDOMINAL HYSTERECTOMY     "w/1 tube"  . ANTERIOR CERVICAL DECOMPRESSION/DISCECTOMY FUSION 4 LEVELS N/A 02/02/2020   Procedure: CERVICAL THREE-FOUR, CERVICAL FOUR-FIVE, CERVICAL FIVE-SIX, CERVICAL SIX-SEVEN ANTERIOR CERVICAL DECOMPRESSION/DISCECTOMY FUSION;  Surgeon: Earnie Larsson, MD;  Location: [facility];  Service: Neurosurgery;  Laterality: N/A;  . ARTERY REPAIR Left 08/15/2018   Procedure: LEFT BRACHIAL ARTERY EXPLORATION;  Surgeon: Waynetta Sandy, MD;  Location: [facility];  Service: Vascular;  Laterality: Left;  . Breast Ultrasound Left 04/16/13   Done @ breast center Impression: no malignancy appearance noted on the screen study is consistent with a summation shadow  . CARDIAC CATHETERIZATION    . CAROTID-SUBCLAVIAN  BYPASS GRAFT Left 06/13/2018   Procedure: BYPASS GRAFT CAROTID-SUBCLAVIAN;  Surgeon: Waynetta Sandy, MD;  Location: [facility];  Service: Vascular;  Laterality: Left;  . COLONOSCOPY    . CYSTOSCOPY WITH RETROGRADE PYELOGRAM, URETEROSCOPY AND STENT PLACEMENT Bilateral 11/02/2020   Procedure: DIAGNOSTIC CYSTOSCOPY WITH RETROGRADE PYELOGRAM,BILATERAL DIAGNOSTIC URETEROSCOPY  AND BILATERAL STENT PLACEMENT;  Surgeon: Robley Fries, MD;  Location: [facility];  Service: Urology;  Laterality: Bilateral;  90 MINS  . DG TUMB RIGHT HAND Right    cyst removal  . IR THORACENTESIS ASP PLEURAL SPACE W/IMG GUIDE  05/31/2018  . JOINT REPLACEMENT    . THORACIC AORTIC ENDOVASCULAR STENT GRAFT Left 06/13/2018   Procedure: LEFT  SUBCLAVIAN TO CAROTID ARTERY TRANSPOSITION; THORACIC AORTIC ENDOVASCULAR STENT GRAFT using GORE CONFORMABLE THORACIC STENT GRAFT AND GORE TAG THORACIC ENDOPROSTHESIS; STENT LEFT COMMON CAROTID ARTERY, RIGHT COMMON-FEMORAL ENDARTERECTOMY WITH PATCH ANGIOPLASTY;  Surgeon: Waynetta Sandy, MD;  Location: [facility];  Service: Vascular;  Laterality: Left;  . TONSILLECTOMY    . TOTAL HIP ARTHROPLASTY Left 01/03/2013   Procedure: TOTAL HIP ARTHROPLASTY ANTERIOR APPROACH;  Surgeon: Mcarthur Rossetti, MD;  Location: [facility];  Service: Orthopedics;  Laterality: Left;  Left Total Hip Arthroplasty, Anterior Approach  . TUBAL LIGATION    . VAGINA RECONSTRUCTION SURGERY  1966   vagina had closed up when 70 years old  . WOUND EXPLORATION Left 06/16/2018   Procedure: LEFT NECK  WOUND EXPLORATION, CHEST TUBE INSERTION;  Surgeon: Waynetta Sandy, MD;  Location: [facility];  Service: Vascular;  Laterality: Left;    Family History  Problem Relation Age of Onset  . Heart disease Father   . Diabetes Mother   . Heart disease Mother        pacemaker  . Heart attack Mother 43  . Diabetes Sister   . Alcohol abuse Other   . Heart disease Other   . Hyperlipidemia Other   .  Hypertension Other   . Diabetes Other   . Breast cancer Other   . Colon cancer Neg Hx   . Esophageal cancer Neg Hx   . Stomach cancer Neg Hx   . Rectal cancer Neg Hx    Social History:  reports that she has never smoked. She has never used smokeless tobacco. She reports previous alcohol use. She reports that she does not use drugs.  Allergies:  Allergies  Allergen Reactions  . Penicillins Swelling and Other (See Comments)    Severe swelling PATIENT HAS HAD A PCN REACTION WITH IMMEDIATE RASH, FACIAL/TONGUE/THROAT SWELLING, SOB, OR LIGHTHEADEDNESS WITH HYPOTENSION:  #  #  YES  #  #  Has patient had a PCN reaction causing severe rash involving mucus membranes or skin necrosis: No PATIENT HAS HAD A PCN REACTION THAT REQUIRED HOSPITALIZATION:  #  #  YES  #  #  Has patient had a PCN reaction occurring within the last 10 years: No  . Shellfish Allergy Anaphylaxis  . Latex Hives and Rash  . Banana Nausea Only  . Kiwi Extract   . Oxycodone     Pt stated, "Made me feel really drowsy" Tolerating as of 02/16/20 after neck surgery  . Asa [Aspirin] Nausea And Vomiting    Makes stomach upset Asprin 325  . Celebrex [Celecoxib] Nausea And Vomiting    GI upset  . Nsaids Rash    Medications Prior to Admission  Medication Sig Dispense Refill  . acetaminophen (TYLENOL) 500 MG tablet Take 1,000 mg by mouth every 8 (eight) hours as needed for moderate pain.    Marland Kitchen aspirin EC 81 MG tablet Take 81 mg by mouth daily.    Marland Kitchen diltiazem (DILACOR XR) 240 MG 24 hr capsule TAKE 1 CAPSULE(240 MG) BY MOUTH EVERY MORNING (Patient taking differently: Take 240 mg by mouth daily. TAKE 1 CAPSULE(240 MG) BY MOUTH EVERY MORNING) 90 capsule 1  . ferrous sulfate 325 (65 FE) MG tablet Take 325 mg by mouth once a week.     . furosemide (LASIX) 20 MG tablet Take 1 tablet (20 mg total) by mouth daily. Hold until 2/7, start taking 2/7.    . Multiple Vitamin (MULTIVITAMIN) tablet Take 2 tablets by mouth daily. Gummies    .  rivaroxaban (XARELTO) 20 MG TABS tablet TAKE 1 TABLET(20 MG) BY MOUTH DAILY WITH SUPPER (Patient taking differently: Take 20 mg by mouth daily with supper.) 90 tablet 3  . rosuvastatin (CRESTOR) 20 MG tablet TAKE 1 TABLET(20 MG) BY MOUTH DAILY (Patient taking differently: Take 20 mg by mouth daily.) 90 tablet 1    No results found for this or any previous visit (from the past 48 hour(s)). No results found.  Review of Systems  All other systems reviewed and are negative.   Blood pressure 133/65, pulse 89, temperature 98.4 F (36.9 C), temperature source Oral, resp. rate 20, height 5\' 4"  (1.626 m), weight 75.1 kg, SpO2 100 %. Physical Exam Vitals reviewed.  Constitutional:  Appearance: Normal appearance.  HENT:     Head: Normocephalic and atraumatic.  Eyes:     Pupils: Pupils are equal, round, and reactive to light.  Cardiovascular:     Rate and Rhythm: Normal rate and regular rhythm.     Pulses: Normal pulses.  Pulmonary:     Effort: Pulmonary effort is normal.  Abdominal:     Palpations: Abdomen is soft.  Musculoskeletal:     Cervical back: Normal range of motion.     Left knee: Effusion present. Decreased range of motion. Tenderness present over the lateral joint line. Abnormal alignment and abnormal meniscus.  Neurological:     Mental Status: She is alert and oriented to person, place, and time.  Psychiatric:        Behavior: Behavior normal.      Assessment/Plan Left knee lateral meniscal tear  To the OR today as an outpatient for a left knee arthroscopy with partial lateral meniscectomy.  The risks and benefits of surgery have been explained in detail and informed consent is obtained.  Mcarthur Rossetti, MD 12/16/2020, 7:13 AM

## 2020-12-16 NOTE — Op Note (Signed)
NAMEGEORGI, Samantha MEDICAL RECORD NO: [id number] ACCOUNT NO: [id number] DATE OF BIRTH: 09/10/1951 FACILITY: [facility] LOCATION: [facility] PHYSICIAN: Lind Guest. Ninfa Linden, Samantha  Operative Report   DATE OF PROCEDURE: 12/16/2020  PREOPERATIVE DIAGNOSIS:  Left knee symptomatic lateral meniscal tear.  POSTOPERATIVE DIAGNOSIS:  Left knee symptomatic lateral meniscal tear.  PROCEDURE:  Left knee arthroscopy with partial lateral meniscectomy.  SURGEON:  Lind Guest. Ninfa Linden, Samantha.  ASSISTANT:  Erskine Emery, PA-C.  ANESTHESIA: 1.  General. 2.  Local with mixture of 1 mL of morphine with plain Marcaine.  ESTIMATED BLOOD LOSS:  Minimal.  ANTIBIOTICS:  900 mg IV clindamycin.  COMPLICATIONS:  [place].  INDICATIONS:  The patient is a 70 year old female with persistent lateral knee pain with her left knee.  We have tried and failed conservative treatment including activity modification, quad strengthening exercises and steroid injections.  After  continued weakness in that knee with locking and catching an MRI was obtained showing a lateral meniscal tear.  At this point, with her continued symptoms, she does wish for an arthroscopic intervention and we have recommended this as well.  We described  in detail what the surgery involves and described the risks and benefits of surgery.  DESCRIPTION OF PROCEDURE:  After informed consent was obtained, the left knee was marked.  She was brought to the operating room and placed supine on the operating table.  General anesthesia was then obtained.  Her left thigh, knee, leg, and ankle were  prepped and draped with DuraPrep and sterile drapes including a sterile stockinette.  With the bed raised in a lateral leg was utilized the left operative knee was flexed off the side of the table.  A timeout was called.  She was identified as a correct  patient, correct left knee.  I then made an anterolateral arthroscopy portal.  I inserted the cannula into the  knee.  We did not find any significant effusion.  I placed the camera in the knee and went to the medial compartment.  We made an anteromedial  incision on that side.  We placed an arthroscopic probe into the knee and the medial compartment was intact with no meniscal tear and the cartilage was normal.  We then assessed intercondylar area and found an intact ACL and PCL.  With the knee in a  figure-of-four position, the lateral side was assessed and it was found to have a posterior horn as well as the mid body and anterior meniscal tearing.  This was all somewhat degenerative in nature and somewhat acute.  There was no cartilage wear on the  lateral femoral condyle or the lateral tibial plateau.  Using arthroscopic shaver and basket forceps biters, we were able to perform a partial lateral meniscectomy in the 3 areas, which were certainly minimal and still left significant meniscal tissue  remaining.  The popliteus tendon was assessed and found to be intact.  There was some synovitis in the back of the knee and that area was debrided.  Finally, the patellofemoral joint was assessed.  There was at least grade II chondromalacia to almost  grade III of the trochlear groove, but no other significant cartilage findings.  We did debride some of inflamed Hoffa's fat pad.  We then allowed fluid lavage of the knee and drained all fluid from the knee.  We closed the portal sites with interrupted nylon  suture and placed our mixture of Marcaine and morphine into the portal sites in the knee joint itself.  A well-padded sterile dressing  was applied.  She was awakened, extubated, and taken to recovery room in stable condition with all final counts being  correct.  There were no complications noted.  Postoperatively, we will have her weightbear as tolerated and she will be discharged from the recovery room to home with followup instructions and wound care instructions.   PUS D: 12/16/2020 8:04:35 am T: 12/16/2020  12:16:00 pm  JOB: [id number]/ [id number]

## 2020-12-16 NOTE — Discharge Instructions (Signed)
You may increase all of your activities as comfort allows. You may put weight on your left knee as comfort allows. Do expect the left knee swelling and pain -ice and elevation periodically throughout the day as needed. You may remove your dressings in 24 to 48 hours and get your left knee wet daily in the shower. After each shower you should place small Band-Aids over each incision daily to protect the sutures.   Post Anesthesia Home Care Instructions  Activity: Get plenty of rest for the remainder of the day. A responsible individual must stay with you for 24 hours following the procedure.  For the next 24 hours, DO NOT: -Drive a car -Paediatric nurse -Drink alcoholic beverages -Take any medication unless instructed by your physician -Make any legal decisions or sign important papers.  Meals: Start with liquid foods such as gelatin or soup. Progress to regular foods as tolerated. Avoid greasy, spicy, heavy foods. If nausea and/or vomiting occur, drink only clear liquids until the nausea and/or vomiting subsides. Call your physician if vomiting continues.  Special Instructions/Symptoms: Your throat may feel dry or sore from the anesthesia or the breathing tube placed in your throat during surgery. If this causes discomfort, gargle with warm salt water. The discomfort should disappear within 24 hours.  If you had a scopolamine patch placed behind your ear for the management of post- operative nausea and/or vomiting:  1. The medication in the patch is effective for 72 hours, after which it should be removed.  Wrap patch in a tissue and discard in the trash. Wash hands thoroughly with soap and water. 2. You may remove the patch earlier than 72 hours if you experience unpleasant side effects which may include dry mouth, dizziness or visual disturbances. 3. Avoid touching the patch. Wash your hands with soap and water after contact with the patch.

## 2020-12-16 NOTE — Transfer of Care (Signed)
Immediate Anesthesia Transfer of Care Note  Patient: Samantha Stevenson  Procedure(s) Performed: LEFT KNEE ARTHROSCOPY WITH PARTIAL LATERAL MENISCECTOMY (Left Knee)  Patient Location: PACU  Anesthesia Type:General  Level of Consciousness: awake, alert  and oriented  Airway & Oxygen Therapy: Patient Spontanous Breathing and Patient connected to face mask oxygen  Post-op Assessment: Report given to RN and Post -op Vital signs reviewed and stable  Post vital signs: Reviewed and stable  Last Vitals:  Vitals Value Taken Time  BP 112/71 12/16/20 0808  Temp 36.4 C 12/16/20 0808  Pulse 88 12/16/20 0809  Resp 15 12/16/20 0809  SpO2 100 % 12/16/20 0809  Vitals shown include unvalidated device data.  Last Pain:  Vitals:   12/16/20 0636  TempSrc: Oral  PainSc: 0-No pain      Patients Stated Pain Goal: 8 (08/24/10 1735)  Complications: No complications documented.

## 2020-12-16 NOTE — Anesthesia Procedure Notes (Signed)
Procedure Name: LMA Insertion Date/Time: 12/16/2020 7:29 AM Performed by: Genelle Bal, CRNA Pre-anesthesia Checklist: Patient identified, Emergency Drugs available, Suction available and Patient being monitored Patient Re-evaluated:Patient Re-evaluated prior to induction Oxygen Delivery Method: Circle system utilized Preoxygenation: Pre-oxygenation with 100% oxygen Induction Type: IV induction Ventilation: Mask ventilation without difficulty LMA: LMA inserted LMA Size: 4.0 Number of attempts: 1 Airway Equipment and Method: Bite block Placement Confirmation: positive ETCO2 Tube secured with: Tape Dental Injury: Teeth and Oropharynx as per pre-operative assessment

## 2020-12-17 ENCOUNTER — Encounter (HOSPITAL_BASED_OUTPATIENT_CLINIC_OR_DEPARTMENT_OTHER): Payer: Self-pay | Admitting: Orthopaedic Surgery

## 2020-12-21 ENCOUNTER — Ambulatory Visit (INDEPENDENT_AMBULATORY_CARE_PROVIDER_SITE_OTHER): Payer: Medicare Other

## 2020-12-21 ENCOUNTER — Other Ambulatory Visit: Payer: Self-pay

## 2020-12-21 ENCOUNTER — Encounter: Payer: Self-pay | Admitting: Internal Medicine

## 2020-12-21 ENCOUNTER — Telehealth: Payer: Self-pay | Admitting: Pharmacist

## 2020-12-21 ENCOUNTER — Ambulatory Visit (INDEPENDENT_AMBULATORY_CARE_PROVIDER_SITE_OTHER): Payer: Medicare Other | Admitting: Internal Medicine

## 2020-12-21 DIAGNOSIS — J811 Chronic pulmonary edema: Secondary | ICD-10-CM | POA: Diagnosis not present

## 2020-12-21 DIAGNOSIS — R0600 Dyspnea, unspecified: Secondary | ICD-10-CM

## 2020-12-21 DIAGNOSIS — I517 Cardiomegaly: Secondary | ICD-10-CM | POA: Diagnosis not present

## 2020-12-21 DIAGNOSIS — R0609 Other forms of dyspnea: Secondary | ICD-10-CM

## 2020-12-21 DIAGNOSIS — Z86711 Personal history of pulmonary embolism: Secondary | ICD-10-CM | POA: Diagnosis not present

## 2020-12-21 NOTE — Progress Notes (Signed)
Samantha Stevenson, female    DOB: 11/29/1950,    MRN: [id number]   Brief patient profile:  75 yowbf never smoker with intermittent sense of sob x 2020 much worse Jan 2022 x flat surface everytime she walks across a parking lot assoc with blood loss anemia from urologic procedure while on eliquis self referred to pulmonary clinic 12/21/2020 for sob     History of Present Illness  12/21/2020  Pulmonary/ 1st office eval/Samantha Stevenson  Chief Complaint  Patient presents with  . Pulmonary Consult    Self referral- pt last seen May 2014.  She c/o DOE since Jan 2022- gets winded walking up stairs and sometimes walking flat surface room to room.   Dyspnea:  Doe x 100 ft / worse with steps/ also some sob at rest but that is better Cough: none Sleep: ok flat bed  SABA use: none ? Correlation with palpitations  No significant leg swelling - legs always big  No obvious day to day or daytime variability or assoc excess/ purulent sputum or mucus plugs or hemoptysis or cp or chest tightness, subjective wheeze or overt sinus or hb symptoms.   Sleeping as above  without nocturnal  or early am exacerbation  of respiratory  c/o's or need for noct saba. Also denies any obvious fluctuation of symptoms with weather or environmental changes or other aggravating or alleviating factors except as outlined above   No unusual exposure hx or h/o childhood pna/ asthma or knowledge of premature birth.  Current Allergies, Complete Past Medical History, Past Surgical History, Family History, and Social History were reviewed in [employer] record.  ROS  The following are not active complaints unless bolded Hoarseness, sore throat, dysphagia, dental problems, itching, sneezing,  nasal congestion or discharge of excess mucus or purulent secretions, ear ache,   fever, chills, sweats, unintended wt loss or wt gain, classically pleuritic or exertional cp,  orthopnea pnd or arm/hand swelling  or leg swelling,  presyncope, palpitations, abdominal pain, anorexia, nausea, vomiting, diarrhea  or change in bowel habits or change in bladder habits, change in stools or change in urine, dysuria, hematuria,  rash, arthralgias, visual complaints, headache, numbness, weakness or ataxia or problems with walking or coordination,  change in mood or  memory.           Past Medical History:  Diagnosis Date  . Allergy   . Anemia   . Arthritis    "qwhere" (07/29/2018)  . Atrial fibrillation ([place])   . CHF (congestive heart failure) ([place])   . Clotting disorder ([place])   . Degenerative arthritis of hip    s/p L THR 12/2012  . Dyspnea    since surgery in August 2019- "when I get worked up and walk a short distance"  . Dysrhythmia   . History of blood transfusion 05/2018   "related to OR"  . Hyperlipidemia   . Hypertension   . Intramural hematoma of thoracic aorta ([place]) 06/13/2018  . Migraines    mIgraines- none since blood pressure and lipids are under control  . Myocardial infarction ([place]) 2000   due to atrial fib  . Neuromuscular disorder ([place])    pinched nerve- left side of neck  . Pulmonary emboli ([place]) 12/2012 dx   post op (L THR), anticoag x 64mo    Outpatient Medications Prior to Visit  Medication Sig Dispense Refill  . acetaminophen (TYLENOL) 500 MG tablet Take 1,000 mg by mouth every 8 (eight) hours as needed for moderate  pain.    . aspirin EC 81 MG tablet Take 81 mg by mouth daily.    Marland Kitchen diltiazem (DILACOR XR) 240 MG 24 hr capsule TAKE 1 CAPSULE(240 MG) BY MOUTH EVERY MORNING (Patient taking differently: Take 240 mg by mouth daily. TAKE 1 CAPSULE(240 MG) BY MOUTH EVERY MORNING) 90 capsule 1  . ferrous sulfate 325 (65 FE) MG tablet Take 325 mg by mouth once a week.     . furosemide (LASIX) 20 MG tablet Take 1 tablet (20 mg total) by mouth daily. Hold until 2/7, start taking 2/7.    Marland Kitchen HYDROcodone-acetaminophen (NORCO/VICODIN) 5-325 MG tablet Take 1-2 tablets by mouth every 6 (six) hours as needed for  moderate pain. 30 tablet 0  . Multiple Vitamin (MULTIVITAMIN) tablet Take 2 tablets by mouth daily. Gummies    . ondansetron (ZOFRAN ODT) 4 MG disintegrating tablet Take 1 tablet (4 mg total) by mouth every 8 (eight) hours as needed for nausea or vomiting. 20 tablet 0  . rivaroxaban (XARELTO) 20 MG TABS tablet TAKE 1 TABLET(20 MG) BY MOUTH DAILY WITH SUPPER (Patient taking differently: Take 20 mg by mouth daily with supper.) 90 tablet 3  . rosuvastatin (CRESTOR) 20 MG tablet TAKE 1 TABLET(20 MG) BY MOUTH DAILY (Patient taking differently: Take 20 mg by mouth daily.) 90 tablet 1   No facility-administered medications prior to visit.     Objective:     BP 124/70 (BP Location: Left Arm, Cuff Size: Normal)   Pulse 74   Temp (!) 97.4 F (36.3 C) (Temporal)   Ht 5\' 4"  (1.626 m)   Wt 165 lb (74.8 kg)   SpO2 100% Comment: on RA  BMI 28.32 kg/m   SpO2: 100 % (on RA)   amb bf nad    HEENT : pt wearing mask not removed for exam due to covid -19 concerns.    NECK :  without JVD/Nodes/TM/ nl carotid upstrokes bilaterally   LUNGS: no acc muscle use,  Nl contour chest which is clear to A and P bilaterally without cough on insp or exp maneuvers   CV:  RRR  no s3 or murmur or increase in P2, and no edema   ABD:  soft and nontender with nl inspiratory excursion in the supine position. No bruits or organomegaly appreciated, bowel sounds nl  MS:  Nl gait/ ext warm without deformities, calf tenderness, cyanosis or clubbing No obvious joint restrictions   SKIN: warm and dry without lesions    NEURO:  alert, approp, nl sensorium with  no motor or cerebellar deficits apparent.    CXR PA and Lateral:   12/21/2020 :    I personally reviewed images and agree with radiology impression as follows:   1. Mild cardiomegaly and pulmonary venous congestion. Aortic stent graft and left carotid stent in stable position. 2. Stable left mid lung density consistent scarring. No  acute infiltrate.   Labs ordered/ reviewed:      Chemistry      Component Value Date/Time   NA 136 12/21/2020 1551   NA 138 10/30/2019 1119   K 4.1 12/21/2020 1551   CL 101 12/21/2020 1551   CO2 26 12/21/2020 1551   BUN 21 12/21/2020 1551   BUN 15 10/30/2019 1119   CREATININE 0.98 12/21/2020 1551   GLU 80 08/29/2012 0000      Component Value Date/Time   CALCIUM 9.0 12/21/2020 1551   ALKPHOS 93 12/02/2020 1315   AST 19 12/02/2020 1315   ALT 11  12/02/2020 1315   BILITOT 0.5 12/02/2020 1315        Lab Results  Component Value Date   WBC 7.4 12/21/2020   HGB 9.6 (L) 12/21/2020   HCT 29.1 (L) 12/21/2020   MCV 85.7 12/21/2020   PLT 357.0 12/21/2020       EOS                                                              0.1                                      12/21/2020        Lab Results  Component Value Date   TSH 1.43 12/21/2020     Lab Results  Component Value Date   PROBNP 17.0 12/21/2020       Lab Results  Component Value Date   ESRSEDRATE 42 (H) 12/21/2020          Assessment   DOE (dyspnea on exertion) Onset Jan 2022 assoc with blood loss anemia hgb down to 6.1 on a  Background of intermittent sob at rest since 2020  - echo 10/20/2019  Grade I  diastolic dysfunction / mild LAE  - 12/21/2020   Walked on RA x one lap =  approx 250 ft -@ mod  pace stopped due to sob with sats 98%    Symptoms are markedly disproportionate to objective findings and not clear to what extent this is actually a pulmonary  problem but pt does appear to have difficult to sort out respiratory symptoms of unknown origin for which  DDX  = almost all start with A and  include Adherence, Ace Inhibitors, Acid Reflux, Active Sinus Disease, Alpha 1 Antitripsin deficiency, Anxiety masquerading as Airways dz,  ABPA,  Allergy(esp in young), Aspiration (esp in elderly), Adverse effects of meds,  Active smoking or Vaping, A bunch of PE's/clot burden (a few small clots can't cause this syndrome  unless there is already severe underlying pulm or vascular dz with poor reserve),  Anemia or thyroid disorder, plus two Bs  = Bronchiectasis and Beta blocker use..and one C= CHF   Adherence is always the initial "prime suspect" and is a multilayered concern that requires a "trust but verify" approach in every patient - starting with knowing how to use medications, especially inhalers, correctly, keeping up with refills and understanding the fundamental difference between maintenance and prns vs those medications only taken for a very short course and then stopped and not refilled.   ? Adverse effects of  meds > none of the usual suspects listed  Anemia/ thryoid dz  - tsh ok   Lab Results  Component Value Date   HGB 9.6 (L) 12/21/2020   HGB 10.0 (L) 12/02/2020   HGB 9.1 (L) 11/25/2020   HGB 9.5 (L) 09/13/2018   HGB 10.1 (L) 03/12/2018   >>> still anemic > f/u as planned on eliquis  ? Anxiety/depression/ decconditioning  > usually at the bottom of this list of usual suspects but   may interfere with adherence and also interpretation of response or lack thereof to symptom management which can be quite subjective.   ? A bunch of PE"s >  On eliquis / echo p PE s evidence TEPAH  ? chf > any degree of anemia aggravates this problem but low bnp is reassuring   >>> f/u with PCP/ cards as no pulmonary problem identified.   Each maintenance medication was reviewed in detail including emphasizing most importantly the difference between maintenance and prns and under what circumstances the prns are to be triggered using an action plan format where appropriate.  Total time for H and P, chart review, counseling,  directly observing portions of ambulatory 02 saturation study/ and generating customized AVS unique to this office visit / same day charting = 30 min                      Christinia Gully, MD 12/21/2020

## 2020-12-21 NOTE — Progress Notes (Signed)
Chronic Care Management Pharmacy Assistant   Name: Goddess Gebbia  MRN: [id number] DOB: May 08, 1951  Reason for Encounter: General Adherence Call   PCP : Hoyt Koch, MD  Allergies:   Allergies  Allergen Reactions  . Penicillins Swelling and Other (See Comments)    Severe swelling PATIENT HAS HAD A PCN REACTION WITH IMMEDIATE RASH, FACIAL/TONGUE/THROAT SWELLING, SOB, OR LIGHTHEADEDNESS WITH HYPOTENSION:  #  #  YES  #  #  Has patient had a PCN reaction causing severe rash involving mucus membranes or skin necrosis: No PATIENT HAS HAD A PCN REACTION THAT REQUIRED HOSPITALIZATION:  #  #  YES  #  #  Has patient had a PCN reaction occurring within the last 10 years: No  . Shellfish Allergy Anaphylaxis  . Latex Hives and Rash  . Banana Nausea Only  . Kiwi Extract   . Oxycodone     Pt stated, "Made me feel really drowsy" Tolerating as of 02/16/20 after neck surgery  . Asa [Aspirin] Nausea And Vomiting    Makes stomach upset Asprin 325  . Celebrex [Celecoxib] Nausea And Vomiting    GI upset  . Nsaids Rash    Medications: Outpatient Encounter Medications as of 12/21/2020  Medication Sig  . acetaminophen (TYLENOL) 500 MG tablet Take 1,000 mg by mouth every 8 (eight) hours as needed for moderate pain.  Marland Kitchen aspirin EC 81 MG tablet Take 81 mg by mouth daily.  Marland Kitchen diltiazem (DILACOR XR) 240 MG 24 hr capsule TAKE 1 CAPSULE(240 MG) BY MOUTH EVERY MORNING (Patient taking differently: Take 240 mg by mouth daily. TAKE 1 CAPSULE(240 MG) BY MOUTH EVERY MORNING)  . ferrous sulfate 325 (65 FE) MG tablet Take 325 mg by mouth once a week.   . furosemide (LASIX) 20 MG tablet Take 1 tablet (20 mg total) by mouth daily. Hold until 2/7, start taking 2/7.  Marland Kitchen HYDROcodone-acetaminophen (NORCO/VICODIN) 5-325 MG tablet Take 1-2 tablets by mouth every 6 (six) hours as needed for moderate pain.  . Multiple Vitamin (MULTIVITAMIN) tablet Take 2 tablets by mouth daily. Gummies  . ondansetron (ZOFRAN  ODT) 4 MG disintegrating tablet Take 1 tablet (4 mg total) by mouth every 8 (eight) hours as needed for nausea or vomiting.  . rivaroxaban (XARELTO) 20 MG TABS tablet TAKE 1 TABLET(20 MG) BY MOUTH DAILY WITH SUPPER (Patient taking differently: Take 20 mg by mouth daily with supper.)  . rosuvastatin (CRESTOR) 20 MG tablet TAKE 1 TABLET(20 MG) BY MOUTH DAILY (Patient taking differently: Take 20 mg by mouth daily.)   No facility-administered encounter medications on file as of 12/21/2020.    Current Diagnosis: Patient Active Problem List   Diagnosis Date Noted  . Chronic diastolic CHF (congestive heart failure) ([place]) 11/24/2020  . Acute blood loss anemia 11/23/2020  . Symptomatic anemia 11/23/2020  . Acute lateral meniscal tear, left, subsequent encounter 11/16/2020  . Cervical spondylosis with myelopathy and radiculopathy 02/02/2020  . Chronic neck pain 07/17/2019  . LLQ pain 05/16/2019  . Chronic bilateral low back pain without sciatica 10/07/2018  . Pain and swelling of left lower leg 08/08/2018  . Left arm swelling 08/08/2018  . Hematuria, gross 07/29/2018  . Thoracic aortic dissection ([place]) 06/13/2018  . Intramural aortic hematoma ([place]) 05/28/2018  . Dysfunction of left eustachian tube 04/05/2018  . Preoperative clearance 02/27/2018  . Bilateral leg edema 02/27/2018  . DOE (dyspnea on exertion) 06/19/2016  . Routine general medical examination at a health care facility 06/24/2015  . Constipation  03/17/2015  . Myalgia and myositis 03/17/2015  . Dyslipidemia   . Atrial fibrillation ([place])   . Hypertension   . History of pulmonary embolism 01/10/2013  . Anemia 01/10/2013  . Arthritis 01/03/2013    Goals Addressed   None     Follow-Up:  Pharmacist Review   A general adherence call was made to Ms. Wilczak to ask how she has been doing since her last care coordination call with the clinical pharmacist Mendel Ryder. The patient states that she has had a few issues with her health  lately. The patient states that just today she saw Dr Melvyn Novas because she was having trouble breathing. She states that she gets sob of breath when she is walking even short distances. She states that no medication changes were made. Also the patient states that she is have knee pain still and her knee is still swollen. She states that she was prescribed hydrocodone and zofran but does not feel like she will take it. She is using tylenol for the pain. She states that after she has her stents put in that her her hands are unsteady, shaking and feels jittery. She states that her hands were not like that before the stents. She would like to discuss this issue with Dr. Claudia Desanctis. I let the patient know that I will pass along the information to the clinical pharmacist Mendel Ryder.   Wendy Poet, [place] [phone]   Time spent:30

## 2020-12-21 NOTE — Patient Instructions (Signed)
Please remember to go to the lab and x-ray department  for your tests - we will call you with the results when they are available.       Follow up is based on findings

## 2020-12-22 LAB — CBC WITH DIFFERENTIAL/PLATELET
Basophils Absolute: 0 10*3/uL (ref 0.0–0.1)
Basophils Relative: 0.6 % (ref 0.0–3.0)
Eosinophils Absolute: 0.1 10*3/uL (ref 0.0–0.7)
Eosinophils Relative: 2 % (ref 0.0–5.0)
HCT: 29.1 % — ABNORMAL LOW (ref 36.0–46.0)
Hemoglobin: 9.6 g/dL — ABNORMAL LOW (ref 12.0–15.0)
Lymphocytes Relative: 28 % (ref 12.0–46.0)
Lymphs Abs: 2.1 10*3/uL (ref 0.7–4.0)
MCHC: 33 g/dL (ref 30.0–36.0)
MCV: 85.7 fl (ref 78.0–100.0)
Monocytes Absolute: 0.5 10*3/uL (ref 0.1–1.0)
Monocytes Relative: 7.2 % (ref 3.0–12.0)
Neutro Abs: 4.6 10*3/uL (ref 1.4–7.7)
Neutrophils Relative %: 62.2 % (ref 43.0–77.0)
Platelets: 357 10*3/uL (ref 150.0–400.0)
RBC: 3.39 Mil/uL — ABNORMAL LOW (ref 3.87–5.11)
RDW: 16.8 % — ABNORMAL HIGH (ref 11.5–15.5)
WBC: 7.4 10*3/uL (ref 4.0–10.5)

## 2020-12-22 LAB — BASIC METABOLIC PANEL
BUN: 21 mg/dL (ref 6–23)
CO2: 26 mEq/L (ref 19–32)
Calcium: 9 mg/dL (ref 8.4–10.5)
Chloride: 101 mEq/L (ref 96–112)
Creatinine, Ser: 0.98 mg/dL (ref 0.40–1.20)
GFR: 59.52 mL/min — ABNORMAL LOW (ref >60.00)
Glucose, Bld: 90 mg/dL (ref 70–99)
Potassium: 4.1 mEq/L (ref 3.5–5.1)
Sodium: 136 mEq/L (ref 135–145)

## 2020-12-22 LAB — SEDIMENTATION RATE: Sed Rate: 42 mm/hr — ABNORMAL HIGH (ref 0–30)

## 2020-12-22 LAB — IGE: IgE (Immunoglobulin E), Serum: 19 kU/L (ref <=114)

## 2020-12-22 LAB — BRAIN NATRIURETIC PEPTIDE: Pro B Natriuretic peptide (BNP): 17 pg/mL (ref 0.0–100.0)

## 2020-12-23 ENCOUNTER — Encounter: Payer: Self-pay | Admitting: Internal Medicine

## 2020-12-23 ENCOUNTER — Ambulatory Visit (INDEPENDENT_AMBULATORY_CARE_PROVIDER_SITE_OTHER): Payer: Medicare Other | Admitting: Physician Assistant

## 2020-12-23 DIAGNOSIS — Z9889 Other specified postprocedural states: Secondary | ICD-10-CM

## 2020-12-23 LAB — TSH: TSH: 1.43 u[IU]/mL (ref 0.35–4.50)

## 2020-12-23 NOTE — Progress Notes (Signed)
Spoke with pt and notified of results per Dr. Wert. Pt verbalized understanding and denied any questions. 

## 2020-12-23 NOTE — Assessment & Plan Note (Addendum)
Onset Jan 2022 assoc with blood loss anemia hgb down to 6.1 on a  Background of intermittent sob at rest since 2020  - echo 10/20/2019  Grade I  diastolic dysfunction / mild LAE  - 12/21/2020   Walked on RA x one lap =  approx 250 ft -@ mod  pace stopped due to sob with sats 98%    Symptoms are markedly disproportionate to objective findings and not clear to what extent this is actually a pulmonary  problem but pt does appear to have difficult to sort out respiratory symptoms of unknown origin for which  DDX  = almost all start with A and  include Adherence, Ace Inhibitors, Acid Reflux, Active Sinus Disease, Alpha 1 Antitripsin deficiency, Anxiety masquerading as Airways dz,  ABPA,  Allergy(esp in young), Aspiration (esp in elderly), Adverse effects of meds,  Active smoking or Vaping, A bunch of PE's/clot burden (a few small clots can't cause this syndrome unless there is already severe underlying pulm or vascular dz with poor reserve),  Anemia or thyroid disorder, plus two Bs  = Bronchiectasis and Beta blocker use..and one C= CHF   Adherence is always the initial "prime suspect" and is a multilayered concern that requires a "trust but verify" approach in every patient - starting with knowing how to use medications, especially inhalers, correctly, keeping up with refills and understanding the fundamental difference between maintenance and prns vs those medications only taken for a very short course and then stopped and not refilled.   ? Adverse effects of  meds > none of the usual suspects listed  Anemia/ thryoid dz  - tsh ok   Lab Results  Component Value Date   HGB 9.6 (L) 12/21/2020   HGB 10.0 (L) 12/02/2020   HGB 9.1 (L) 11/25/2020   HGB 9.5 (L) 09/13/2018   HGB 10.1 (L) 03/12/2018   >>> still anemic > f/u as planned on eliquis  ? Anxiety/depression/ decconditioning  > usually at the bottom of this list of usual suspects but   may interfere with adherence and also interpretation of response  or lack thereof to symptom management which can be quite subjective.   ? A bunch of PE"s >  On eliquis / echo p PE s evidence TEPAH  ? chf > any degree of anemia aggravates this problem but low bnp is reassuring   >>> f/u with PCP/ cards as no pulmonary problem identified.   Each maintenance medication was reviewed in detail including emphasizing most importantly the difference between maintenance and prns and under what circumstances the prns are to be triggered using an action plan format where appropriate.  Total time for H and P, chart review, counseling,  directly observing portions of ambulatory 02 saturation study/ and generating customized AVS unique to this office visit / same day charting = 30 min

## 2020-12-23 NOTE — Progress Notes (Signed)
HPI: Samantha Stevenson returns today status post left knee arthroscopy with partial lateral meniscectomy.  She is overall doing well.  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  Has had no chest pain shortness of breath.  Physical exam: Left knee port sites are well approximated with interrupted nylon sutures no sign of infection.  Calf supple nontender.  Dorsiflexion plantarflexion left ankle intact.  Impression: Status post left knee arthroscopy with partial lateral meniscectomy.  Plan: Sutures removed.  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  Reviewed the arthroscopy pictures with her today showing her the lateral meniscal tear pre and post meniscectomy.  Medial compartment was well-preserved.  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  She will work on range of motion of the knee.  Follow-up with [place] in 1 month sooner if there is any questions concerns peer

## 2021-01-13 ENCOUNTER — Telehealth: Payer: Medicare Other

## 2021-01-20 ENCOUNTER — Ambulatory Visit (INDEPENDENT_AMBULATORY_CARE_PROVIDER_SITE_OTHER): Payer: Medicare Other | Admitting: Physician Assistant

## 2021-01-20 ENCOUNTER — Other Ambulatory Visit: Payer: Self-pay

## 2021-01-20 ENCOUNTER — Ambulatory Visit (HOSPITAL_COMMUNITY)
Admission: RE | Admit: 2021-01-20 | Discharge: 2021-01-20 | Disposition: A | Discharge status: Home / Self Care | Payer: Medicare Other | Source: Ambulatory Visit | Attending: Physician Assistant | Admitting: Physician Assistant

## 2021-01-20 ENCOUNTER — Ambulatory Visit (INDEPENDENT_AMBULATORY_CARE_PROVIDER_SITE_OTHER): Payer: Medicare Other | Admitting: Internal Medicine

## 2021-01-20 ENCOUNTER — Ambulatory Visit
Admission: RE | Admit: 2021-01-20 | Discharge: 2021-01-20 | Disposition: A | Discharge status: Home / Self Care | Payer: Medicare Other | Source: Ambulatory Visit | Attending: Internal Medicine | Admitting: Internal Medicine

## 2021-01-20 ENCOUNTER — Encounter: Payer: Self-pay | Admitting: Physician Assistant

## 2021-01-20 ENCOUNTER — Encounter: Payer: Self-pay | Admitting: Internal Medicine

## 2021-01-20 VITALS — BP 128/80 | HR 79 | Temp 98.3°F | Ht 64.0 in | Wt 164.0 lb

## 2021-01-20 DIAGNOSIS — R1032 Left lower quadrant pain: Secondary | ICD-10-CM

## 2021-01-20 DIAGNOSIS — N200 Calculus of kidney: Secondary | ICD-10-CM | POA: Diagnosis not present

## 2021-01-20 DIAGNOSIS — E538 Deficiency of other specified B group vitamins: Secondary | ICD-10-CM

## 2021-01-20 DIAGNOSIS — D62 Acute posthemorrhagic anemia: Secondary | ICD-10-CM

## 2021-01-20 DIAGNOSIS — D500 Iron deficiency anemia secondary to blood loss (chronic): Secondary | ICD-10-CM

## 2021-01-20 DIAGNOSIS — M7989 Other specified soft tissue disorders: Secondary | ICD-10-CM

## 2021-01-20 DIAGNOSIS — R0600 Dyspnea, unspecified: Secondary | ICD-10-CM

## 2021-01-20 DIAGNOSIS — R310 Gross hematuria: Secondary | ICD-10-CM | POA: Diagnosis not present

## 2021-01-20 DIAGNOSIS — R0609 Other forms of dyspnea: Secondary | ICD-10-CM

## 2021-01-20 DIAGNOSIS — R319 Hematuria, unspecified: Secondary | ICD-10-CM

## 2021-01-20 LAB — COMPREHENSIVE METABOLIC PANEL
ALT: 12 U/L (ref 0–35)
AST: 20 U/L (ref 0–37)
Albumin: 3.9 g/dL (ref 3.5–5.2)
Alkaline Phosphatase: 92 U/L (ref 39–117)
BUN: 18 mg/dL (ref 6–23)
CO2: 30 mEq/L (ref 19–32)
Calcium: 9.2 mg/dL (ref 8.4–10.5)
Chloride: 103 mEq/L (ref 96–112)
Creatinine, Ser: 1.07 mg/dL (ref 0.40–1.20)
GFR: 53.54 mL/min — ABNORMAL LOW (ref >60.00)
Glucose, Bld: 92 mg/dL (ref 70–99)
Potassium: 3.6 mEq/L (ref 3.5–5.1)
Sodium: 140 mEq/L (ref 135–145)
Total Bilirubin: 0.5 mg/dL (ref 0.2–1.2)
Total Protein: 7.1 g/dL (ref 6.0–8.3)

## 2021-01-20 LAB — POCT URINALYSIS DIPSTICK
Bilirubin, UA: NEGATIVE
Glucose, UA: NEGATIVE
Ketones, UA: NEGATIVE
Leukocytes, UA: NEGATIVE
Nitrite, UA: NEGATIVE
Protein, UA: POSITIVE — AB
Spec Grav, UA: 1.02 (ref 1.010–1.025)
Urobilinogen, UA: 0.2 E.U./dL
pH, UA: 5.5 (ref 5.0–8.0)

## 2021-01-20 LAB — CBC
HCT: 30.2 % — ABNORMAL LOW (ref 36.0–46.0)
Hemoglobin: 9.9 g/dL — ABNORMAL LOW (ref 12.0–15.0)
MCHC: 32.7 g/dL (ref 30.0–36.0)
MCV: 86.1 fl (ref 78.0–100.0)
Platelets: 316 10*3/uL (ref 150.0–400.0)
RBC: 3.51 Mil/uL — ABNORMAL LOW (ref 3.87–5.11)
RDW: 16.3 % — ABNORMAL HIGH (ref 11.5–15.5)
WBC: 5.6 10*3/uL (ref 4.0–10.5)

## 2021-01-20 LAB — VITAMIN B12: Vitamin B-12: 222 pg/mL (ref 211–911)

## 2021-01-20 LAB — FERRITIN: Ferritin: 15.9 ng/mL (ref 10.0–291.0)

## 2021-01-20 NOTE — Assessment & Plan Note (Signed)
With ongoing blood loss in urine needs CBC, B12, ferritin. Still taking xarelto and with prior blood transfusion.

## 2021-01-20 NOTE — Assessment & Plan Note (Signed)
Concern for worsening anemia given ongoing blood loss. Checking CBC and ferritin and B12 and adjust as needed.

## 2021-01-20 NOTE — Assessment & Plan Note (Signed)
Checking US renal to assess for problems. She has had this pain since stent placement in Jan by urology and this has not improved.

## 2021-01-20 NOTE — Patient Instructions (Signed)
We will check the labs today and the ultrasound of the kidneys and bladder to see if we can find out why you are still bleeding.

## 2021-01-20 NOTE — Progress Notes (Signed)
HPI: Samantha Stevenson comes in today 4 weeks status post left knee arthroscopy exam she underwent left knee arthroscopy with partial lateral meniscectomy.  She states over the last week she has developed severe calf pain and swelling left leg.  She states the calf pain is keeping her awake at night.  She has had no chest pain.  She does state she has had some shortness of breath.  Physical exam: Left knee port sites well-healed good range of motion left knee.  She has tenderness left calf.  Slight swelling left lower leg compared to the right.  +1 pitting edema left leg.  Impression: Status post left knee arthroscopy with partial lateral meniscectomy 12/16/2020 Left calf pain/lower leg edema  Plan: She will continue work on range of motion strengthening the knee.  We will obtain an ultrasound to rule out DVT.  Have her continue work on scar tissue mobilization at the port site.  Have her follow-up with [place] in 1 month sooner if there is any questions concerns.  If she negative for DVT recommendation for swelling with the use of compression hose which she has at home.  Questions were encouraged and answered at length.  We will call her with the results of the ultrasound of the left lower leg.

## 2021-01-20 NOTE — Assessment & Plan Note (Signed)
Checking CBC and ferritin and B12.

## 2021-01-20 NOTE — Assessment & Plan Note (Signed)
Still with persistent hematuria. Checked U/A in office consistent with blood in urine without infection. Referral to different urologist. Checking US renal/bladder to evaluate for cause.

## 2021-01-20 NOTE — Progress Notes (Signed)
   Subjective:   Patient ID: Samantha Stevenson, female    DOB: Feb 04, 1951, 70 y.o.   MRN: [id number]  HPI The patient is a 70 YO female coming in for ongoing problems with blood in urine (ongoing, did slow down when she was off blood thinner, then got worse again within days, urine looks dark with blood at times, rarely yellow, she denies clots, has not heard back from urology and due to poor experience she does not want to return to that group) and low abdominal pain (since she got the stents placed in January, still having blood in urine, denies current clots in urine, denies fevers or chills, denies diarrhea or constipation) and SOB on exertion (getting worse again, was improved some after blood transfusion, denies chest pains or cough, recent CXR normal with pulmonary).   Review of Systems  Constitutional: Positive for fatigue.  HENT: Negative.   Eyes: Negative.   Respiratory: Positive for shortness of breath. Negative for cough and chest tightness.   Cardiovascular: Negative for chest pain, palpitations and leg swelling.  Gastrointestinal: Positive for abdominal pain. Negative for abdominal distention, anal bleeding, blood in stool, constipation, diarrhea, nausea, rectal pain and vomiting.  Genitourinary: Positive for hematuria.  Musculoskeletal: Positive for back pain and myalgias.  Skin: Negative.   Neurological: Negative.   Psychiatric/Behavioral: Negative.     Objective:  Physical Exam Constitutional:      Appearance: She is well-developed.  HENT:     Head: Normocephalic and atraumatic.  Cardiovascular:     Rate and Rhythm: Normal rate and regular rhythm.  Pulmonary:     Effort: Pulmonary effort is normal. No respiratory distress.     Breath sounds: Normal breath sounds. No wheezing or rales.  Abdominal:     General: Bowel sounds are normal. There is no distension.     Palpations: Abdomen is soft.     Tenderness: There is abdominal tenderness. There is no rebound.      Comments: Tenderness low abdomen bilaterally  Musculoskeletal:     Cervical back: Normal range of motion.  Skin:    General: Skin is warm and dry.  Neurological:     Mental Status: She is alert and oriented to person, place, and time.     Coordination: Coordination normal.     Vitals:   01/20/21 0858  BP: 128/80  Pulse: 79  Temp: 98.3 F (36.8 C)  TempSrc: Oral  SpO2: 99%  Weight: 164 lb (74.4 kg)  Height: 5\' 4"  (1.626 m)    This visit occurred during the SARS-CoV-2 public health emergency.  Safety protocols were in place, including screening questions prior to the visit, additional usage of staff PPE, and extensive cleaning of exam room while observing appropriate contact time as indicated for disinfecting solutions.   Assessment & Plan:

## 2021-01-21 ENCOUNTER — Encounter: Payer: Self-pay | Admitting: Internal Medicine

## 2021-02-01 ENCOUNTER — Ambulatory Visit: Payer: Medicare Other | Admitting: Internal Medicine

## 2021-02-10 ENCOUNTER — Telehealth: Payer: Self-pay | Admitting: Internal Medicine

## 2021-02-10 ENCOUNTER — Other Ambulatory Visit: Payer: Self-pay | Admitting: Internal Medicine

## 2021-02-10 DIAGNOSIS — R310 Gross hematuria: Secondary | ICD-10-CM | POA: Diagnosis not present

## 2021-02-10 NOTE — Telephone Encounter (Signed)
Patient called and said that she was at her Urologist and they did lab work on the patient. Her blood count was 9.8. she is requesting a call back at [phone].

## 2021-02-10 NOTE — Telephone Encounter (Signed)
See below

## 2021-02-11 NOTE — Telephone Encounter (Signed)
Spoke with the patient and she stated that she is having shortness of breath and fatigue with little exertion. She stated that she isn't able to do much of anything due to these symptoms. She has been scheduled to follow up in office 02/14/2021 at 1:40 pm.

## 2021-02-11 NOTE — Telephone Encounter (Signed)
See below

## 2021-02-11 NOTE — Telephone Encounter (Signed)
Unable to get in contact with the patient. LVM asking the patient to return my call here at the office. Office number was provided.

## 2021-02-11 NOTE — Telephone Encounter (Signed)
That would not be low enough for blood transfusion. If she is having any symptoms of low blood counts (SOB, chest pains, fatigue) can have visit in office next week for recheck.

## 2021-02-11 NOTE — Telephone Encounter (Signed)
She is wondering if she needs another blood transfusion

## 2021-02-11 NOTE — Telephone Encounter (Signed)
Did you call patient to see what question she had?

## 2021-02-14 ENCOUNTER — Other Ambulatory Visit: Payer: Self-pay

## 2021-02-14 ENCOUNTER — Encounter: Payer: Self-pay | Admitting: Internal Medicine

## 2021-02-14 ENCOUNTER — Ambulatory Visit (INDEPENDENT_AMBULATORY_CARE_PROVIDER_SITE_OTHER): Payer: Medicare Other | Admitting: Internal Medicine

## 2021-02-14 VITALS — BP 126/72 | HR 85 | Temp 98.3°F | Resp 18 | Ht 64.0 in | Wt 166.0 lb

## 2021-02-14 DIAGNOSIS — I480 Paroxysmal atrial fibrillation: Secondary | ICD-10-CM | POA: Diagnosis not present

## 2021-02-14 DIAGNOSIS — R310 Gross hematuria: Secondary | ICD-10-CM

## 2021-02-14 DIAGNOSIS — D62 Acute posthemorrhagic anemia: Secondary | ICD-10-CM

## 2021-02-14 DIAGNOSIS — R0600 Dyspnea, unspecified: Secondary | ICD-10-CM | POA: Diagnosis not present

## 2021-02-14 DIAGNOSIS — R0609 Other forms of dyspnea: Secondary | ICD-10-CM

## 2021-02-14 NOTE — Progress Notes (Signed)
   Subjective:   Patient ID: Samantha Stevenson, female    DOB: 1950/12/25, 70 y.o.   MRN: [id number]  HPI The patient is a 70 YO female coming in for concerns about ongoing hematuria. She is having blood in urine consistently since January. She is feeling tired, low energy, food does not taste right. She had recent labs with urology with Hg 9.8 (stable from prior 3 weeks previously). She is having a cystoscopy in June to evaluate if there is a source. She does take xarelto for A fib. Denies current clots in the urine but pink in color. No blood leaking onto underwear. Denies chest pains or SOB.   Review of Systems  Constitutional: Positive for activity change, appetite change and fatigue.  HENT: Negative.   Eyes: Negative.   Respiratory: Negative for cough, chest tightness and shortness of breath.   Cardiovascular: Negative for chest pain, palpitations and leg swelling.  Gastrointestinal: Negative for abdominal distention, abdominal pain, constipation, diarrhea, nausea and vomiting.  Endocrine: Negative.   Genitourinary: Positive for hematuria.  Musculoskeletal: Negative.   Skin: Negative.   Neurological: Negative.   Psychiatric/Behavioral: Negative.     Objective:  Physical Exam Constitutional:      Appearance: She is well-developed.  HENT:     Head: Normocephalic and atraumatic.  Cardiovascular:     Rate and Rhythm: Normal rate and regular rhythm.  Pulmonary:     Effort: Pulmonary effort is normal. No respiratory distress.     Breath sounds: Normal breath sounds. No wheezing or rales.  Abdominal:     General: Bowel sounds are normal. There is no distension.     Palpations: Abdomen is soft.     Tenderness: There is no abdominal tenderness. There is no rebound.  Musculoskeletal:     Cervical back: Normal range of motion.  Skin:    General: Skin is warm and dry.  Neurological:     Mental Status: She is alert and oriented to person, place, and time.     Coordination:  Coordination normal.     Vitals:   02/14/21 1338  BP: 126/72  Pulse: 85  Resp: 18  Temp: 98.3 F (36.8 C)  TempSrc: Oral  SpO2: 97%  Weight: 166 lb (75.3 kg)  Height: 5\' 4"  (1.626 m)    This visit occurred during the SARS-CoV-2 public health emergency.  Safety protocols were in place, including screening questions prior to the visit, additional usage of staff PPE, and extensive cleaning of exam room while observing appropriate contact time as indicated for disinfecting solutions.   Assessment & Plan:

## 2021-02-14 NOTE — Patient Instructions (Signed)
We will get the iron transfusion ordered.   We have ordered labs to get checked in about 2 weeks or so.

## 2021-02-17 NOTE — Assessment & Plan Note (Signed)
We did talk about her risk for stroke being about 10% per year due to her risk factors. Currently her hemoglobin levels are stable. I will reach out to her cardiologist to see if they feel she should remain on her xarelto or if we could reduce dosing slightly to see if we can get her hematuria to stop.

## 2021-02-17 NOTE — Assessment & Plan Note (Signed)
Ordered CBC to monitor in 2-3 weeks. Depending on results may need iron infusion. For worsening symptoms call us back. For worsening blood in urine or new clots in urine let us know and we can retest levels sooner.

## 2021-02-17 NOTE — Assessment & Plan Note (Signed)
Overall stable currently and Hg is stable making anemia induced SOB less likely the whole cause. There is likely some deconditioning given several serious illnesses in the last few months and ongoing moderate anemia.

## 2021-02-17 NOTE — Assessment & Plan Note (Signed)
Stable Hg recently with urology that I was able to review in care everywhere. She is having procedure in June to assess for source of bleeding and pain. We will continue to monitor blood counts and watch for symptoms of progression. This is an active problem which is not fully diagnosed yet. It is exacerbated by her xarelto for her A fib.

## 2021-02-18 ENCOUNTER — Encounter: Payer: Self-pay | Admitting: Internal Medicine

## 2021-02-18 ENCOUNTER — Other Ambulatory Visit: Payer: Self-pay | Admitting: Internal Medicine

## 2021-02-18 MED ORDER — RIVAROXABAN 10 MG PO TABS: 10.0000 mg | ORAL_TABLET | Freq: Every day | ORAL | 1 refills | Status: DC

## 2021-03-10 ENCOUNTER — Emergency Department (HOSPITAL_COMMUNITY): Payer: Medicare Other

## 2021-03-10 ENCOUNTER — Encounter (HOSPITAL_COMMUNITY): Payer: Self-pay | Admitting: Pharmacy Technician

## 2021-03-10 ENCOUNTER — Other Ambulatory Visit: Payer: Self-pay

## 2021-03-10 ENCOUNTER — Emergency Department (HOSPITAL_COMMUNITY)
Admission: EM | Admit: 2021-03-10 | Discharge: 2021-03-10 | Disposition: A | Discharge status: Home / Self Care | Payer: Medicare Other | Attending: Emergency Medicine | Admitting: Emergency Medicine

## 2021-03-10 DIAGNOSIS — N200 Calculus of kidney: Secondary | ICD-10-CM | POA: Diagnosis not present

## 2021-03-10 DIAGNOSIS — R0789 Other chest pain: Secondary | ICD-10-CM | POA: Diagnosis not present

## 2021-03-10 DIAGNOSIS — Z7901 Long term (current) use of anticoagulants: Secondary | ICD-10-CM | POA: Insufficient documentation

## 2021-03-10 DIAGNOSIS — R079 Chest pain, unspecified: Secondary | ICD-10-CM | POA: Diagnosis not present

## 2021-03-10 DIAGNOSIS — Z86711 Personal history of pulmonary embolism: Secondary | ICD-10-CM | POA: Insufficient documentation

## 2021-03-10 DIAGNOSIS — Z96641 Presence of right artificial hip joint: Secondary | ICD-10-CM | POA: Insufficient documentation

## 2021-03-10 DIAGNOSIS — Z79899 Other long term (current) drug therapy: Secondary | ICD-10-CM | POA: Diagnosis not present

## 2021-03-10 DIAGNOSIS — Z7982 Long term (current) use of aspirin: Secondary | ICD-10-CM | POA: Insufficient documentation

## 2021-03-10 DIAGNOSIS — N179 Acute kidney failure, unspecified: Secondary | ICD-10-CM | POA: Insufficient documentation

## 2021-03-10 DIAGNOSIS — I7789 Other specified disorders of arteries and arterioles: Secondary | ICD-10-CM | POA: Diagnosis not present

## 2021-03-10 DIAGNOSIS — I110 Hypertensive heart disease with heart failure: Secondary | ICD-10-CM | POA: Insufficient documentation

## 2021-03-10 DIAGNOSIS — K8689 Other specified diseases of pancreas: Secondary | ICD-10-CM | POA: Diagnosis not present

## 2021-03-10 DIAGNOSIS — K5790 Diverticulosis of intestine, part unspecified, without perforation or abscess without bleeding: Secondary | ICD-10-CM | POA: Diagnosis not present

## 2021-03-10 DIAGNOSIS — I728 Aneurysm of other specified arteries: Secondary | ICD-10-CM | POA: Diagnosis not present

## 2021-03-10 DIAGNOSIS — I5032 Chronic diastolic (congestive) heart failure: Secondary | ICD-10-CM | POA: Insufficient documentation

## 2021-03-10 DIAGNOSIS — K7689 Other specified diseases of liver: Secondary | ICD-10-CM | POA: Diagnosis not present

## 2021-03-10 DIAGNOSIS — J984 Other disorders of lung: Secondary | ICD-10-CM | POA: Diagnosis not present

## 2021-03-10 DIAGNOSIS — Z91040 Latex allergy status: Secondary | ICD-10-CM | POA: Insufficient documentation

## 2021-03-10 DIAGNOSIS — M47814 Spondylosis without myelopathy or radiculopathy, thoracic region: Secondary | ICD-10-CM | POA: Diagnosis not present

## 2021-03-10 LAB — BASIC METABOLIC PANEL
Anion gap: 11 (ref 5–15)
BUN: 38 mg/dL — ABNORMAL HIGH (ref 8–23)
CO2: 25 mmol/L (ref 22–32)
Calcium: 9.2 mg/dL (ref 8.9–10.3)
Chloride: 102 mmol/L (ref 98–111)
Creatinine, Ser: 1.65 mg/dL — ABNORMAL HIGH (ref 0.44–1.00)
GFR, Estimated: 34 mL/min — ABNORMAL LOW (ref >60)
Glucose, Bld: 90 mg/dL (ref 70–99)
Potassium: 4 mmol/L (ref 3.5–5.1)
Sodium: 138 mmol/L (ref 135–145)

## 2021-03-10 LAB — HEPATIC FUNCTION PANEL
ALT: 14 U/L (ref 0–44)
AST: 22 U/L (ref 15–41)
Albumin: 3.6 g/dL (ref 3.5–5.0)
Alkaline Phosphatase: 81 U/L (ref 38–126)
Bilirubin, Direct: <0.1 mg/dL (ref 0.0–0.2)
Total Bilirubin: 0.4 mg/dL (ref 0.3–1.2)
Total Protein: 7.4 g/dL (ref 6.5–8.1)

## 2021-03-10 LAB — CBC
HCT: 31.7 % — ABNORMAL LOW (ref 36.0–46.0)
Hemoglobin: 9.8 g/dL — ABNORMAL LOW (ref 12.0–15.0)
MCH: 27.8 pg (ref 26.0–34.0)
MCHC: 30.9 g/dL (ref 30.0–36.0)
MCV: 89.8 fL (ref 80.0–100.0)
Platelets: 359 10*3/uL (ref 150–400)
RBC: 3.53 MIL/uL — ABNORMAL LOW (ref 3.87–5.11)
RDW: 15.1 % (ref 11.5–15.5)
WBC: 6.2 10*3/uL (ref 4.0–10.5)
nRBC: 0 % (ref 0.0–0.2)

## 2021-03-10 LAB — URINALYSIS, ROUTINE W REFLEX MICROSCOPIC
Bacteria, UA: NONE SEEN
Bilirubin Urine: NEGATIVE
Glucose, UA: NEGATIVE mg/dL
Ketones, ur: NEGATIVE mg/dL
Leukocytes,Ua: NEGATIVE
Nitrite: NEGATIVE
Protein, ur: NEGATIVE mg/dL
RBC / HPF: >50 RBC/hpf — ABNORMAL HIGH (ref 0–5)
Specific Gravity, Urine: 1.014 (ref 1.005–1.030)
pH: 5 (ref 5.0–8.0)

## 2021-03-10 LAB — TROPONIN I (HIGH SENSITIVITY)
Troponin I (High Sensitivity): 4 ng/L (ref <18)
Troponin I (High Sensitivity): 4 ng/L (ref <18)

## 2021-03-10 LAB — LIPASE, BLOOD: Lipase: 54 U/L — ABNORMAL HIGH (ref 11–51)

## 2021-03-10 MED ORDER — SODIUM CHLORIDE 0.9 % IV BOLUS
500.0000 mL | Freq: Once | INTRAVENOUS | Status: AC
  Administered 2021-03-10: 500 mL via INTRAVENOUS

## 2021-03-10 MED ORDER — FENTANYL CITRATE (PF) 100 MCG/2ML IJ SOLN
50.0000 ug | Freq: Once | INTRAMUSCULAR | Status: AC
  Administered 2021-03-10: 50 ug via INTRAVENOUS

## 2021-03-10 MED ORDER — FENTANYL CITRATE (PF) 100 MCG/2ML IJ SOLN
INTRAMUSCULAR | Status: AC
  Filled 2021-03-10: qty 2

## 2021-03-10 MED ORDER — IOHEXOL 350 MG/ML SOLN
50.0000 mL | Freq: Once | INTRAVENOUS | Status: AC
  Administered 2021-03-10: 50 mL via INTRAVENOUS

## 2021-03-10 NOTE — ED Notes (Signed)
Discharge instructions reviewed and explained, pt verbalized understanding.

## 2021-03-10 NOTE — ED Notes (Signed)
Pt was able to eat [place] sandwich, applesauce, graham crackers with ginger ale, pt denies nausea. No vomiting after eating. Pt continues to deny CP.

## 2021-03-10 NOTE — ED Notes (Signed)
Pt requesting to eat and is ambulatory with steady gait to bathroom, VSS on ccm. Nand.

## 2021-03-10 NOTE — ED Provider Notes (Signed)
[facility] EMERGENCY DEPARTMENT Provider Note   CSN: [id number] Arrival date & time: 03/10/21  1009     History Chief Complaint  Patient presents with  . Chest Pain    Samantha Stevenson is a 70 y.o. female.  Presenting to the emergency room with concern for chest pain.  Patient states that pain ongoing for the past couple days.  Worsening today.  8 out of 10 in severity.  Radiates to back and abdomen.  Central.  Denies difficulty in breathing.  Sharp.  Extensive past medical history including A. fib, hypertension, PE, status post thoracic aortic endograft placement  HPI     Past Medical History:  Diagnosis Date  . Allergy   . Anemia   . Arthritis    "qwhere" (07/29/2018)  . Atrial fibrillation ([place])   . CHF (congestive heart failure) ([place])   . Clotting disorder ([place])   . Degenerative arthritis of hip    s/p L THR 12/2012  . Dyspnea    since surgery in August 2019- "when I get worked up and walk a short distance"  . Dysrhythmia   . History of blood transfusion 05/2018   "related to OR"  . Hyperlipidemia   . Hypertension   . Intramural hematoma of thoracic aorta ([place]) 06/13/2018  . Migraines    mIgraines- none since blood pressure and lipids are under control  . Myocardial infarction ([place]) 2000   due to atrial fib  . Neuromuscular disorder ([place])    pinched nerve- left side of neck  . Pulmonary emboli ([place]) 12/2012 dx   post op (L THR), anticoag x 58mo    Patient Active Problem List   Diagnosis Date Noted  . Chronic diastolic CHF (congestive heart failure) ([place]) 11/24/2020  . Acute blood loss anemia 11/23/2020  . Acute lateral meniscal tear, left, subsequent encounter 11/16/2020  . Cervical spondylosis with myelopathy and radiculopathy 02/02/2020  . Chronic neck pain 07/17/2019  . LLQ pain 05/16/2019  . Chronic bilateral low back pain without sciatica 10/07/2018  . Pain and swelling of left lower leg 08/08/2018  . Left arm swelling 08/08/2018   . Hematuria, gross 07/29/2018  . Thoracic aortic dissection ([place]) 06/13/2018  . Intramural aortic hematoma ([place]) 05/28/2018  . Dysfunction of left eustachian tube 04/05/2018  . Preoperative clearance 02/27/2018  . Bilateral leg edema 02/27/2018  . DOE (dyspnea on exertion) 06/19/2016  . Routine general medical examination at a health care facility 06/24/2015  . Constipation 03/17/2015  . Myalgia and myositis 03/17/2015  . Dyslipidemia   . Atrial fibrillation ([place])   . Hypertension   . History of pulmonary embolism 01/10/2013  . Anemia 01/10/2013  . Arthritis 01/03/2013    Past Surgical History:  Procedure Laterality Date  . ABDOMINAL HYSTERECTOMY     "w/1 tube"  . ANTERIOR CERVICAL DECOMPRESSION/DISCECTOMY FUSION 4 LEVELS N/A 02/02/2020   Procedure: CERVICAL THREE-FOUR, CERVICAL FOUR-FIVE, CERVICAL FIVE-SIX, CERVICAL SIX-SEVEN ANTERIOR CERVICAL DECOMPRESSION/DISCECTOMY FUSION;  Surgeon: Earnie Larsson, MD;  Location: [facility];  Service: Neurosurgery;  Laterality: N/A;  . ARTERY REPAIR Left 08/15/2018   Procedure: LEFT BRACHIAL ARTERY EXPLORATION;  Surgeon: Waynetta Sandy, MD;  Location: [facility];  Service: Vascular;  Laterality: Left;  . Breast Ultrasound Left 04/16/13   Done @ breast center Impression: no malignancy appearance noted on the screen study is consistent with a summation shadow  . CARDIAC CATHETERIZATION    . CAROTID-SUBCLAVIAN BYPASS GRAFT Left 06/13/2018   Procedure: BYPASS GRAFT CAROTID-SUBCLAVIAN;  Surgeon: Donzetta Matters,  Georgia Dom, MD;  Location: [facility];  Service: Vascular;  Laterality: Left;  . COLONOSCOPY    . CYSTOSCOPY WITH RETROGRADE PYELOGRAM, URETEROSCOPY AND STENT PLACEMENT Bilateral 11/02/2020   Procedure: DIAGNOSTIC CYSTOSCOPY WITH RETROGRADE PYELOGRAM,BILATERAL DIAGNOSTIC URETEROSCOPY  AND BILATERAL STENT PLACEMENT;  Surgeon: Robley Fries, MD;  Location: [facility];  Service: Urology;  Laterality: Bilateral;  90 MINS  . DG TUMB RIGHT  HAND Right    cyst removal  . IR THORACENTESIS ASP PLEURAL SPACE W/IMG GUIDE  05/31/2018  . JOINT REPLACEMENT    . KNEE ARTHROSCOPY Left 12/16/2020   Procedure: LEFT KNEE ARTHROSCOPY WITH PARTIAL LATERAL MENISCECTOMY;  Surgeon: Mcarthur Rossetti, MD;  Location: [facility];  Service: Orthopedics;  Laterality: Left;  . THORACIC AORTIC ENDOVASCULAR STENT GRAFT Left 06/13/2018   Procedure: LEFT  SUBCLAVIAN TO CAROTID ARTERY TRANSPOSITION; THORACIC AORTIC ENDOVASCULAR STENT GRAFT using GORE CONFORMABLE THORACIC STENT GRAFT AND GORE TAG THORACIC ENDOPROSTHESIS; STENT LEFT COMMON CAROTID ARTERY, RIGHT COMMON-FEMORAL ENDARTERECTOMY WITH PATCH ANGIOPLASTY;  Surgeon: Waynetta Sandy, MD;  Location: [facility];  Service: Vascular;  Laterality: Left;  . TONSILLECTOMY    . TOTAL HIP ARTHROPLASTY Left 01/03/2013   Procedure: TOTAL HIP ARTHROPLASTY ANTERIOR APPROACH;  Surgeon: Mcarthur Rossetti, MD;  Location: [facility];  Service: Orthopedics;  Laterality: Left;  Left Total Hip Arthroplasty, Anterior Approach  . TUBAL LIGATION    . VAGINA RECONSTRUCTION SURGERY  1966   vagina had closed up when 70 years old  . WOUND EXPLORATION Left 06/16/2018   Procedure: LEFT NECK WOUND EXPLORATION, CHEST TUBE INSERTION;  Surgeon: Waynetta Sandy, MD;  Location: [facility];  Service: Vascular;  Laterality: Left;     OB History   No obstetric history on file.     Family History  Problem Relation Age of Onset  . Heart disease Father   . Diabetes Mother   . Heart disease Mother        pacemaker  . Heart attack Mother 37  . Diabetes Sister   . Alcohol abuse Other   . Heart disease Other   . Hyperlipidemia Other   . Hypertension Other   . Diabetes Other   . Breast cancer Other   . Colon cancer Neg Hx   . Esophageal cancer Neg Hx   . Stomach cancer Neg Hx   . Rectal cancer Neg Hx     Social History   Tobacco Use  . Smoking status: Never Smoker  . Smokeless tobacco: Never Used   Vaping Use  . Vaping Use: Never used  Substance Use Topics  . Alcohol use: Not Currently  . Drug use: Never    Home Medications Prior to Admission medications   Medication Sig Start Date End Date Taking? Authorizing Provider  acetaminophen (TYLENOL) 500 MG tablet Take 1,000 mg by mouth every 8 (eight) hours as needed for moderate pain.   Yes [provider]  aspirin EC 81 MG tablet Take 81 mg by mouth at bedtime.   Yes [provider]  diltiazem (DILT-XR) 240 MG 24 hr capsule TAKE 1 CAPSULE(240 MG) BY MOUTH EVERY MORNING 02/10/21  Yes Hoyt Koch, MD  ferrous sulfate 325 (65 FE) MG tablet Take 325 mg by mouth once a week.  01/05/13  Yes Pete Pelt, PA-C  furosemide (LASIX) 20 MG tablet Take 1 tablet (20 mg total) by mouth daily. Hold until 2/7, start taking 2/7. Patient taking differently: Take 20 mg by mouth at bedtime.  11/25/20  Yes Samuella Cota, MD  Multiple Vitamin (MULTIVITAMIN) tablet Take 2 tablets by mouth daily. Gummies   Yes [provider]  rivaroxaban (XARELTO) 10 MG TABS tablet Take 1 tablet (10 mg total) by mouth daily. Patient taking differently: Take 10 mg by mouth every evening. 02/18/21  Yes Hoyt Koch, MD  rosuvastatin (CRESTOR) 20 MG tablet TAKE 1 TABLET(20 MG) BY MOUTH DAILY Patient taking differently: Take 20 mg by mouth every evening. 08/13/20  Yes Biagio Borg, MD    Allergies    Penicillins, Shellfish allergy, Latex, Banana, Kiwi extract, Oxycodone, Asa [aspirin], Celebrex [celecoxib], and Nsaids  Review of Systems   Review of Systems  Constitutional: Negative for chills and fever.  HENT: Negative for ear pain and sore throat.   Eyes: Negative for pain and visual disturbance.  Respiratory: Negative for cough and shortness of breath.   Cardiovascular: Positive for chest pain. Negative for palpitations.  Gastrointestinal: Negative for abdominal pain and vomiting.  Genitourinary: Negative for dysuria and  hematuria.  Musculoskeletal: Positive for back pain. Negative for arthralgias.  Skin: Negative for color change and rash.  Neurological: Negative for seizures and syncope.  All other systems reviewed and are negative.   Physical Exam Updated Vital Signs BP 112/63 (BP Location: Left Arm)   Pulse 69   Temp 97.8 F (36.6 C) (Oral)   Resp 18   SpO2 98%   Physical Exam Vitals and nursing note reviewed.  Constitutional:      General: She is not in acute distress.    Appearance: She is well-developed.  HENT:     Head: Normocephalic and atraumatic.  Eyes:     Conjunctiva/sclera: Conjunctivae normal.  Cardiovascular:     Rate and Rhythm: Normal rate and regular rhythm.     Heart sounds: No murmur heard.   Pulmonary:     Effort: Pulmonary effort is normal. No respiratory distress.     Breath sounds: Normal breath sounds.  Abdominal:     Palpations: Abdomen is soft.     Tenderness: There is no abdominal tenderness.  Musculoskeletal:     Cervical back: Neck supple.  Skin:    General: Skin is warm and dry.     Capillary Refill: Capillary refill takes less than 2 seconds.  Neurological:     General: No focal deficit present.     Mental Status: She is alert.     ED Results / Procedures / Treatments   Labs (all labs ordered are listed, but only abnormal results are displayed) Labs Reviewed  BASIC METABOLIC PANEL - Abnormal; Notable for the following components:      Result Value   BUN 38 (*)    Creatinine, Ser 1.65 (*)    GFR, Estimated 34 (*)    All other components within normal limits  CBC - Abnormal; Notable for the following components:   RBC 3.53 (*)    Hemoglobin 9.8 (*)    HCT 31.7 (*)    All other components within normal limits  LIPASE, BLOOD - Abnormal; Notable for the following components:   Lipase 54 (*)    All other components within normal limits  HEPATIC FUNCTION PANEL  URINALYSIS, ROUTINE W REFLEX MICROSCOPIC  TROPONIN I (HIGH SENSITIVITY)   TROPONIN I (HIGH SENSITIVITY)    EKG EKG Interpretation  Date/Time:  Thursday Mar 10 2021 10:15:19 EDT Ventricular Rate:  92 PR Interval:  144 QRS Duration: 74 QT Interval:  356 QTC Calculation: 440 R Axis:   -  63 Text Interpretation: Normal sinus rhythm Possible Left atrial enlargement Left anterior fascicular block Abnormal ECG When compared to prior, similar appearance with less wandering baseline. No STEMI Confirmed by Antony Blackbird [id number]) on 03/10/2021 3:16:36 PM   Radiology DG Chest 2 View  Result Date: 03/10/2021 CLINICAL DATA:  Chest pain EXAM: CHEST - 2 VIEW COMPARISON:  December 21, 2020 FINDINGS: Slight scarring left mid lung. Lungs are otherwise clear. Heart size and pulmonary vascularity are normal. No adenopathy. Stent and aortic arch and descending aorta as well as in the left innominate region, stable. Postoperative change lower cervical spine. No pneumothorax. No bone lesions. IMPRESSION: Slight scarring left mid lung. Lungs otherwise clear. Heart size normal. Stents again noted, unchanged. Electronically Signed   By: Lowella Grip III M.D.   On: 03/10/2021 11:04   CT Angio Chest/Abd/Pel for Dissection W and/or Wo Contrast  Result Date: 03/10/2021 CLINICAL DATA:  Chest and back pain with shortness of breath for several days, history of prior stent graft repair EXAM: CT ANGIOGRAPHY CHEST, ABDOMEN AND PELVIS TECHNIQUE: Non-contrast CT of the chest was initially obtained. Multidetector CT imaging through the chest, abdomen and pelvis was performed using the standard protocol during bolus administration of intravenous contrast. Multiplanar reconstructed images and MIPs were obtained and reviewed to evaluate the vascular anatomy. CONTRAST:  55mL OMNIPAQUE IOHEXOL 350 MG/ML SOLN COMPARISON:  04/30/2020 FINDINGS: CTA CHEST FINDINGS Cardiovascular: Initial precontrast images demonstrate no hyperdense crescent to suggest acute aortic injury. Post-contrast images demonstrate aortic  stent graft within the distal aspect of the aortic arch covering the origin of the left subclavian artery. No evidence of endoleak is seen. The stent graft is widely patent. Arterial stent is noted within the common carotid artery on the left approximately and there is evidence of transposition of the left subclavian artery to the midportion of the left common carotid artery. No significant coronary calcifications are noted. The pulmonary artery is incompletely evaluated due to poor contrast opacification. No ascending dilatation is noted. No evidence of dissection is seen. Mediastinum/Nodes: Thoracic inlet is within normal limits. No sizable hilar or mediastinal adenopathy is noted. The esophagus as visualized is within normal limits. Lungs/Pleura: Mild scarring is noted in the lingula. The lungs are otherwise well aerated without focal acute abnormality. Musculoskeletal: Degenerative changes of the thoracic spine are noted. Postsurgical changes in the lower cervical spine are seen. No acute rib abnormality is noted. No compression deformity is seen. Review of the MIP images confirms the above findings. CTA ABDOMEN AND PELVIS FINDINGS VASCULAR Aorta: Abdominal aorta shows mild atherosclerotic calcifications without aneurysmal dilatation. Stable ectasia in the proximal abdominal aorta is noted with normal distal tapering. Celiac: Celiac axis is patent. Some mild fusiform dilatation is noted just beyond its origin but stable in appearance. SMA: Patent without evidence of aneurysm, dissection, vasculitis or significant stenosis. Renals: Both renal arteries are patent without evidence of aneurysm, dissection, vasculitis, fibromuscular dysplasia or significant stenosis. IMA: Patent without evidence of aneurysm, dissection, vasculitis or significant stenosis. Iliacs: Atherosclerotic calcifications are noted. Mild stable dilatation of the left internal iliac artery is noted. Veins: No specific venous abnormality is noted.  Review of the MIP images confirms the above findings. NON-VASCULAR Hepatobiliary: Tiny hypodensity is noted in the inferior aspect of the right lobe of the liver stable from the prior exam likely representing a small cyst. Liver is otherwise unremarkable. Gallbladder is within normal limits. Pancreas: Persistent ductal dilatation is noted within the pancreas. No definitive mass is seen. Spleen: Normal  in size without focal abnormality. Adrenals/Urinary Tract: Adrenal glands are unremarkable. Kidneys demonstrate a normal enhancement pattern bilaterally. Tiny renal calculi are noted in the upper pole on the left. No obstructive changes are seen bladder is partially distended. Stomach/Bowel: Scattered diverticular change of the colon is noted without definitive diverticulitis. No obstructive or inflammatory changes of the colon are seen. The appendix is within normal limits. No small bowel or gastric abnormality is seen. Lymphatic: No significant lymphadenopathy is noted. Reproductive: Status post hysterectomy. No adnexal masses. Other: No abdominal wall hernia or abnormality. No abdominopelvic ascites. Musculoskeletal: Postsurgical changes are noted in the left hip. No acute bony abnormality is noted. Review of the MIP images confirms the above findings. IMPRESSION: CTA of the chest: Patent aortic stent graft similar to that seen on the prior exam from 2021. No endoleak is identified. Patent left carotid stent with left carotid to subclavian anastomosis. CTA of the abdomen and pelvis: No abdominal aortic dissection is noted. Stable appearance of the arterial tree in the abdomen and pelvis as described above. Diverticulosis without diverticulitis. Tiny nonobstructing stone in the upper pole of the left kidney. No other focal abnormality is noted. Electronically Signed   By: Inez Catalina M.D.   On: 03/10/2021 15:31    Procedures Procedures   Medications Ordered in ED Medications  fentaNYL (SUBLIMAZE) injection  50 mcg (50 mcg Intravenous Given 03/10/21 1311)  sodium chloride 0.9 % bolus 500 mL (500 mLs Intravenous New Bag/Given 03/10/21 1333)  iohexol (OMNIPAQUE) 350 MG/ML injection 50 mL (50 mLs Intravenous Contrast Given 03/10/21 1457)    ED Course  I have reviewed the triage vital signs and the nursing notes.  Pertinent labs & imaging results that were available during my care of the patient were reviewed by me and considered in my medical decision making (see chart for details).    MDM Rules/Calculators/A&P                         70 year old lady with extensive past medical history of thoracic aortic endograft, PE, A. fib presenting to ER with concern for chest pain that radiates to the back and her abdomen.  EKG without obvious ischemic change, trop wnl, doubt ACS.  Given her aortic history and reported chest pain radiating to back, will check CTA chest abdomen pelvis.  At time of signout, CT imaging pending, reassessment pending.  Dr. Sherry Ruffing will follow up on results.  Final Clinical Impression(s) / ED Diagnoses Final diagnoses:  Chest pain, unspecified type    Rx / DC Orders ED Discharge Orders    None       Lucrezia Starch, MD 03/10/21 [phone]

## 2021-03-10 NOTE — ED Notes (Signed)
Pt provided meal tray, MD at bedside

## 2021-03-10 NOTE — Discharge Instructions (Signed)
Your work-up today was overall reassuring but we did discover elevation in your creatinine/kidney function.  As you are able to eat and drink, feel you are safe to maintain hydration at home.  Given your otherwise well appearance and resolution of symptoms, we had a shared decision-making conversation and agreed with discharge home.  Please follow-up as we discussed with both urology and your PCP for repeat kidney function assessment.  If any symptoms change or worsen acutely, please return to the nearest emergency department.

## 2021-03-10 NOTE — ED Provider Notes (Signed)
3:15 PM Care assumed from Dr. Roslynn Amble.  At time of transfer care, patient is awaiting for results of CT dissection study to look for a vascular cause of the chest pain, abdominal pain with a history of a previous aortic repair and hematoma.  Given the patient's report that her discomfort feels similar to when she is had MI in the past, if her CT imaging and other work-up is reassuring, anticipate touching base with cardiology to discuss further management.  3:35 PM Patient CT scan does not show any acute changes or abnormalities compared to prior.  Specifically no dissection seen.  Patient does have acute kidney injury compared to prior.  Initial troponin is negative.  Anticipate reassessment to determine disposition and if she is feeling better and can maintain hydration, will consider discharge home and but given her report of previous cardiac pain, anticipate likely admission.   6:05 PM Patient CT imaging is reassuring and I went over that with the patient.  She reports he was feeling much better and would like to go home.  She is following up with a urologist for her frequent hematuria and will follow up with PCP for recheck of her AKI and creatinine that we discussed.  She is able to eat and drink without difficulty and can maintain hydration.  She otherwise is well-appearing and would like to go home.  Patient agreed with plan of care and did not want to admitted.  Patient discharged in good condition.   Clinical Impression: 1. Chest pain, unspecified type   2. AKI (acute kidney injury) ([place])     Disposition: Discharge  Condition: Good  I have discussed the results, Dx and Tx plan with the pt(& family if present). He/she/they expressed understanding and agree(s) with the plan. Discharge instructions discussed at great length. Strict return precautions discussed and pt &/or family have verbalized understanding of the instructions. No further questions at time of discharge.    New  Prescriptions   No medications on file    Follow Up: Dajane, Valli, MD [place] [address] [phone]     your urologist     [place] [facility] [address] [id number] mc [place]        Francetta Ilg, Gwenyth Allegra, MD 03/10/21 1807

## 2021-03-10 NOTE — ED Triage Notes (Addendum)
Pt here with chest pain, central in nature, worse with inspiration onset Monday. Pt with hx MI, PE. Pt states recently had xarelto dose decreased from 20mg  to 10mg . Pt states the pain feels like her MI.

## 2021-03-10 NOTE — ED Notes (Signed)
Ambulated to bathroom and back without incident

## 2021-03-18 DIAGNOSIS — D649 Anemia, unspecified: Secondary | ICD-10-CM | POA: Diagnosis not present

## 2021-03-18 DIAGNOSIS — R310 Gross hematuria: Secondary | ICD-10-CM | POA: Diagnosis not present

## 2021-03-18 DIAGNOSIS — N189 Chronic kidney disease, unspecified: Secondary | ICD-10-CM | POA: Diagnosis not present

## 2021-03-18 DIAGNOSIS — D62 Acute posthemorrhagic anemia: Secondary | ICD-10-CM | POA: Diagnosis not present

## 2021-03-23 DIAGNOSIS — R310 Gross hematuria: Secondary | ICD-10-CM | POA: Diagnosis not present

## 2021-03-23 DIAGNOSIS — R3915 Urgency of urination: Secondary | ICD-10-CM | POA: Diagnosis not present

## 2021-03-23 DIAGNOSIS — N1832 Chronic kidney disease, stage 3b: Secondary | ICD-10-CM | POA: Diagnosis not present

## 2021-03-23 DIAGNOSIS — R808 Other proteinuria: Secondary | ICD-10-CM | POA: Diagnosis not present

## 2021-03-23 DIAGNOSIS — Z87442 Personal history of urinary calculi: Secondary | ICD-10-CM | POA: Diagnosis not present

## 2021-03-24 ENCOUNTER — Telehealth: Payer: Self-pay | Admitting: Internal Medicine

## 2021-03-24 DIAGNOSIS — R310 Gross hematuria: Secondary | ICD-10-CM

## 2021-03-24 DIAGNOSIS — D62 Acute posthemorrhagic anemia: Secondary | ICD-10-CM

## 2021-03-24 NOTE — Telephone Encounter (Signed)
   Patient requesting referral to specialist She wants to see Hematologist  Recent notes from visit with Dr Gaynelle Arabian placed in Dr Nathanial Millman box

## 2021-03-24 NOTE — Telephone Encounter (Signed)
See below

## 2021-03-25 NOTE — Telephone Encounter (Signed)
Referral placed to hematology

## 2021-03-29 ENCOUNTER — Encounter: Payer: Self-pay | Admitting: Internal Medicine

## 2021-03-29 ENCOUNTER — Telehealth: Payer: Self-pay | Admitting: Hematology and Oncology

## 2021-03-29 NOTE — Telephone Encounter (Signed)
Scheduled appt per 6/3 referral. Pt aware of appt.

## 2021-03-30 MED ORDER — RIVAROXABAN 20 MG PO TABS: 20.0000 mg | ORAL_TABLET | Freq: Every day | ORAL | 3 refills | Status: DC

## 2021-03-30 NOTE — Progress Notes (Signed)
[place] NOTE  Patient Care Team: Hoyt Koch, MD as PCP - General (Internal Medicine) [name], MD as PCP - Cardiology (Cardiology) Mcarthur Rossetti, MD (Orthopedic Surgery) Tanda Rockers, MD (Pulmonary Disease) Inda Castle, MD (Inactive) (Gastroenterology) Earnie Larsson, MD as Consulting Physician (Neurosurgery) Charlton Haws, [facility] as Pharmacist (Pharmacist) [name], [place] (Optometry) Loletha Carrow Kirke Corin, MD as Consulting Physician (Gastroenterology) Lorelle Gibbs, MD (Radiology) Paulla Dolly Tamala Fothergill, DPM as Consulting Physician (Podiatry)  CHIEF COMPLAINTS/PURPOSE OF CONSULTATION:  Newly diagnosed hematuria  HISTORY OF PRESENTING ILLNESS:  Samantha Stevenson 70 y.o. female is here because of recent diagnosis of hematuria since January 2022. She is currently on Xarelto for atrial fibrillation and aspirin. Labs on 03/10/21 showed Hg 9.8, HCT 31.7, platelets 351. She presents to the clinic today for initial evaluation and discussion of treatment options.  Patient tells me that she has been on Xarelto since 2016.  And when she developed atrial fibrillation the recommended continuation of Xarelto.  She had 1 episode of hematuria in 2020.  She underwent bilateral ureteral stent placements in January 2022.  Since that time she has had issues related to bleeding.  She had subsequent scopes done which did not show any organic lesions that could explain the cause of the hematuria.  She was referred to me because of continued ongoing issues with bleeding.  Because of bleeding she had to quit her job yesterday.  She develops bleeding every time she exerts.  She was instructed to take oral iron but she has not been taking it.  She was also instructed to take B12 and she is taking it intermittently.  I reviewed her records extensively and collaborated the history with the patient.  MEDICAL HISTORY:  Past Medical History:   Diagnosis Date   Allergy    Anemia    Arthritis    "qwhere" (07/29/2018)   Atrial fibrillation (HCC)    CHF (congestive heart failure) (HCC)    Clotting disorder (HCC)    Degenerative arthritis of hip    s/p L THR 12/2012   Dyspnea    since surgery in August 2019- "when I get worked up and walk a short distance"   Dysrhythmia    History of blood transfusion 05/2018   "related to OR"   Hyperlipidemia    Hypertension    Intramural hematoma of thoracic aorta ([place]) 06/13/2018   Migraines    mIgraines- none since blood pressure and lipids are under control   Myocardial infarction ([place]) 2000   due to atrial fib   Neuromuscular disorder (HCC)    pinched nerve- left side of neck   Pulmonary emboli ([place]) 12/2012 dx   post op (L THR), anticoag x 82mo    SURGICAL HISTORY: Past Surgical History:  Procedure Laterality Date   ABDOMINAL HYSTERECTOMY     "w/1 tube"   ANTERIOR CERVICAL DECOMPRESSION/DISCECTOMY FUSION 4 LEVELS N/A 02/02/2020   Procedure: CERVICAL THREE-FOUR, CERVICAL FOUR-FIVE, CERVICAL FIVE-SIX, CERVICAL SIX-SEVEN ANTERIOR CERVICAL DECOMPRESSION/DISCECTOMY FUSION;  Surgeon: Earnie Larsson, MD;  Location: [facility];  Service: Neurosurgery;  Laterality: N/A;   ARTERY REPAIR Left 08/15/2018   Procedure: LEFT BRACHIAL ARTERY EXPLORATION;  Surgeon: Waynetta Sandy, MD;  Location: [facility];  Service: Vascular;  Laterality: Left;   Breast Ultrasound Left 04/16/13   Done @ breast center Impression: no malignancy appearance noted on the screen study is consistent with a summation shadow   CARDIAC CATHETERIZATION     CAROTID-SUBCLAVIAN  BYPASS GRAFT Left 06/13/2018   Procedure: BYPASS GRAFT CAROTID-SUBCLAVIAN;  Surgeon: Waynetta Sandy, MD;  Location: [facility];  Service: Vascular;  Laterality: Left;   COLONOSCOPY     CYSTOSCOPY WITH RETROGRADE PYELOGRAM, URETEROSCOPY AND STENT PLACEMENT Bilateral 11/02/2020   Procedure: DIAGNOSTIC CYSTOSCOPY WITH RETROGRADE PYELOGRAM,BILATERAL  DIAGNOSTIC URETEROSCOPY  AND BILATERAL STENT PLACEMENT;  Surgeon: Robley Fries, MD;  Location: [facility];  Service: Urology;  Laterality: Bilateral;  90 MINS   DG TUMB RIGHT HAND Right    cyst removal   IR THORACENTESIS ASP PLEURAL SPACE W/IMG GUIDE  05/31/2018   JOINT REPLACEMENT     KNEE ARTHROSCOPY Left 12/16/2020   Procedure: LEFT KNEE ARTHROSCOPY WITH PARTIAL LATERAL MENISCECTOMY;  Surgeon: Mcarthur Rossetti, MD;  Location: [facility];  Service: Orthopedics;  Laterality: Left;   THORACIC AORTIC ENDOVASCULAR STENT GRAFT Left 06/13/2018   Procedure: LEFT  SUBCLAVIAN TO CAROTID ARTERY TRANSPOSITION; THORACIC AORTIC ENDOVASCULAR STENT GRAFT using GORE CONFORMABLE THORACIC STENT GRAFT AND GORE TAG THORACIC ENDOPROSTHESIS; STENT LEFT COMMON CAROTID ARTERY, RIGHT COMMON-FEMORAL ENDARTERECTOMY WITH PATCH ANGIOPLASTY;  Surgeon: Waynetta Sandy, MD;  Location: [facility];  Service: Vascular;  Laterality: Left;   TONSILLECTOMY     TOTAL HIP ARTHROPLASTY Left 01/03/2013   Procedure: TOTAL HIP ARTHROPLASTY ANTERIOR APPROACH;  Surgeon: Mcarthur Rossetti, MD;  Location: [facility];  Service: Orthopedics;  Laterality: Left;  Left Total Hip Arthroplasty, Anterior Approach   TUBAL LIGATION     VAGINA RECONSTRUCTION SURGERY  1966   vagina had closed up when 70 years old   WOUND EXPLORATION Left 06/16/2018   Procedure: LEFT NECK WOUND EXPLORATION, CHEST TUBE INSERTION;  Surgeon: Waynetta Sandy, MD;  Location: [facility];  Service: Vascular;  Laterality: Left;    SOCIAL HISTORY: Social History   Socioeconomic History   Marital status: Single    Spouse name: Not on file   Number of children: 2   Years of education: Not on file   Highest education level: Not on file  Occupational History   Occupation: retired  Tobacco Use   Smoking status: Never   Smokeless tobacco: Never  Vaping Use   Vaping Use: Never used  Substance and Sexual Activity   Alcohol  use: Not Currently   Drug use: Never   Sexual activity: Not Currently  Other Topics Concern   Not on file  Social History Narrative   Not on file   Social Determinants of Health   Financial Resource Strain: Not on file  Food Insecurity: Not on file  Transportation Needs: Not on file  Physical Activity: Not on file  Stress: Not on file  Social Connections: Not on file  Intimate Partner Violence: Not on file    FAMILY HISTORY: Family History  Problem Relation Age of Onset   Heart disease Father    Diabetes Mother    Heart disease Mother        pacemaker   Heart attack Mother 54   Diabetes Sister    Alcohol abuse Other    Heart disease Other    Hyperlipidemia Other    Hypertension Other    Diabetes Other    Breast cancer Other    Colon cancer Neg Hx    Esophageal cancer Neg Hx    Stomach cancer Neg Hx    Rectal cancer Neg Hx     ALLERGIES:  is allergic to penicillins, shellfish allergy, latex, banana, kiwi extract, oxycodone, asa [aspirin], celebrex [celecoxib], and nsaids.  MEDICATIONS:  Current Outpatient Medications  Medication Sig Dispense Refill   acetaminophen (TYLENOL) 500 MG tablet Take 1,000 mg by mouth every 8 (eight) hours as needed for moderate pain.     aspirin EC 81 MG tablet Take 81 mg by mouth at bedtime.     diltiazem (DILT-XR) 240 MG 24 hr capsule TAKE 1 CAPSULE(240 MG) BY MOUTH EVERY MORNING 90 capsule 0   ferrous sulfate 325 (65 FE) MG tablet Take 325 mg by mouth once a week.      furosemide (LASIX) 20 MG tablet Take 1 tablet (20 mg total) by mouth daily. Hold until 2/7, start taking 2/7. (Patient taking differently: Take 20 mg by mouth at bedtime.)     Multiple Vitamin (MULTIVITAMIN) tablet Take 2 tablets by mouth daily. Gummies     rivaroxaban (XARELTO) 20 MG TABS tablet Take 1 tablet (20 mg total) by mouth daily with supper. 90 tablet 3   rosuvastatin (CRESTOR) 20 MG tablet TAKE 1 TABLET(20 MG) BY MOUTH DAILY (Patient taking differently: Take 20  mg by mouth every evening.) 90 tablet 1   No current facility-administered medications for this visit.    REVIEW OF SYSTEMS:   Constitutional: Denies fevers, chills or abnormal night sweats Hematuria as mentioned above All other systems were reviewed with the patient and are negative.  PHYSICAL EXAMINATION: ECOG PERFORMANCE STATUS: 1 - Symptomatic but completely ambulatory  Vitals:   03/31/21 1612  BP: (!) 121/52  Pulse: 75  Resp: 18  Temp: 97.7 F (36.5 C)  SpO2: 100%   Filed Weights   03/31/21 1612  Weight: 169 lb 12.8 oz (77 kg)     LABORATORY DATA:  I have reviewed the data as listed Lab Results  Component Value Date   WBC 6.2 03/10/2021   HGB 9.8 (L) 03/10/2021   HCT 31.7 (L) 03/10/2021   MCV 89.8 03/10/2021   PLT 359 03/10/2021   Lab Results  Component Value Date   NA 138 03/10/2021   K 4.0 03/10/2021   CL 102 03/10/2021   CO2 25 03/10/2021    RADIOGRAPHIC STUDIES: I have personally reviewed the radiological reports and agreed with the findings in the report.  ASSESSMENT AND PLAN:  Recurrent hematuria since January 2022: Status post urological procedures.  It appears that follow-up scopes did not reveal any source of bleeding.  She develops these bleeding symptoms every time she exerts.  Bleeding/clotting pathways: Patient is on aspirin which is enabling platelet function.  She is also on Xarelto which is not bleeding clotting factors.  She is on Xarelto currently for history of blood clot and current issues with atrial fibrillation.  She thinks she may have had an acute coronary syndrome long time ago and was prescribed aspirin.  Plan: Stop aspirin for 4 weeks and reassess. If that stops bleeding then we will discuss with cardiology about permanent discontinuation of aspirin. If that does not stop bleeding then we will hold Xarelto for 2 weeks and we will resume Xarelto once the bleeding subsides. Patient understands fully well that when we stop  anticoagulation there is an increased risk of stroke.  However given the nature of her bleeding, she is willing to take that chance. I will send for von Willebrand factor and factor VIII analysis as well as iron studies to see if she needs intravenous iron therapy.  Longstanding history of anemia her baseline hemoglobin is around 10-11 most recent blood work on 03/10/2021 showed a hemoglobin of 9.8  All questions  were answered. The patient knows to call the clinic with any problems, questions or concerns.   Rulon Eisenmenger, MD, MPH 03/31/2021    I, Molly Dorshimer, am acting as scribe for Nicholas Lose, MD.  I have reviewed the above documentation for accuracy and completeness, and I agree with the above.

## 2021-03-31 ENCOUNTER — Inpatient Hospital Stay: Payer: Medicare Other | Attending: Hematology and Oncology | Admitting: Hematology and Oncology

## 2021-03-31 ENCOUNTER — Other Ambulatory Visit: Payer: Self-pay

## 2021-03-31 VITALS — BP 121/52 | HR 75 | Temp 97.7°F | Resp 18 | Ht 64.0 in | Wt 169.8 lb

## 2021-03-31 DIAGNOSIS — D649 Anemia, unspecified: Secondary | ICD-10-CM | POA: Insufficient documentation

## 2021-03-31 DIAGNOSIS — I509 Heart failure, unspecified: Secondary | ICD-10-CM | POA: Diagnosis not present

## 2021-03-31 DIAGNOSIS — G709 Myoneural disorder, unspecified: Secondary | ICD-10-CM | POA: Diagnosis not present

## 2021-03-31 DIAGNOSIS — Z86711 Personal history of pulmonary embolism: Secondary | ICD-10-CM | POA: Diagnosis not present

## 2021-03-31 DIAGNOSIS — I10 Essential (primary) hypertension: Secondary | ICD-10-CM | POA: Insufficient documentation

## 2021-03-31 DIAGNOSIS — Z7982 Long term (current) use of aspirin: Secondary | ICD-10-CM | POA: Diagnosis not present

## 2021-03-31 DIAGNOSIS — D500 Iron deficiency anemia secondary to blood loss (chronic): Secondary | ICD-10-CM

## 2021-03-31 DIAGNOSIS — I252 Old myocardial infarction: Secondary | ICD-10-CM | POA: Diagnosis not present

## 2021-03-31 DIAGNOSIS — R319 Hematuria, unspecified: Secondary | ICD-10-CM | POA: Insufficient documentation

## 2021-03-31 DIAGNOSIS — E785 Hyperlipidemia, unspecified: Secondary | ICD-10-CM | POA: Diagnosis not present

## 2021-03-31 DIAGNOSIS — Z79899 Other long term (current) drug therapy: Secondary | ICD-10-CM | POA: Insufficient documentation

## 2021-03-31 DIAGNOSIS — Z7901 Long term (current) use of anticoagulants: Secondary | ICD-10-CM | POA: Diagnosis not present

## 2021-03-31 DIAGNOSIS — M1990 Unspecified osteoarthritis, unspecified site: Secondary | ICD-10-CM | POA: Insufficient documentation

## 2021-03-31 DIAGNOSIS — I4891 Unspecified atrial fibrillation: Secondary | ICD-10-CM | POA: Insufficient documentation

## 2021-03-31 DIAGNOSIS — R310 Gross hematuria: Secondary | ICD-10-CM

## 2021-04-01 ENCOUNTER — Inpatient Hospital Stay: Payer: Medicare Other

## 2021-04-01 ENCOUNTER — Other Ambulatory Visit: Payer: Self-pay | Admitting: *Deleted

## 2021-04-01 ENCOUNTER — Other Ambulatory Visit: Payer: Self-pay | Admitting: Internal Medicine

## 2021-04-01 DIAGNOSIS — I4891 Unspecified atrial fibrillation: Secondary | ICD-10-CM | POA: Diagnosis not present

## 2021-04-01 DIAGNOSIS — D649 Anemia, unspecified: Secondary | ICD-10-CM | POA: Diagnosis not present

## 2021-04-01 DIAGNOSIS — R310 Gross hematuria: Secondary | ICD-10-CM

## 2021-04-01 DIAGNOSIS — D500 Iron deficiency anemia secondary to blood loss (chronic): Secondary | ICD-10-CM

## 2021-04-01 DIAGNOSIS — Z7901 Long term (current) use of anticoagulants: Secondary | ICD-10-CM | POA: Diagnosis not present

## 2021-04-01 DIAGNOSIS — Z7982 Long term (current) use of aspirin: Secondary | ICD-10-CM | POA: Diagnosis not present

## 2021-04-01 DIAGNOSIS — I6529 Occlusion and stenosis of unspecified carotid artery: Secondary | ICD-10-CM

## 2021-04-01 DIAGNOSIS — R319 Hematuria, unspecified: Secondary | ICD-10-CM | POA: Diagnosis not present

## 2021-04-01 DIAGNOSIS — Z86711 Personal history of pulmonary embolism: Secondary | ICD-10-CM | POA: Diagnosis not present

## 2021-04-01 LAB — IRON AND TIBC
Iron: 38 ug/dL — ABNORMAL LOW (ref 41–142)
Saturation Ratios: 10 % — ABNORMAL LOW (ref 21–57)
TIBC: 381 ug/dL (ref 236–444)
UIBC: 343 ug/dL (ref 120–384)

## 2021-04-01 LAB — CBC WITH DIFFERENTIAL (CANCER CENTER ONLY)
Abs Immature Granulocytes: 0.01 10*3/uL (ref 0.00–0.07)
Basophils Absolute: 0 10*3/uL (ref 0.0–0.1)
Basophils Relative: 0 %
Eosinophils Absolute: 0.2 10*3/uL (ref 0.0–0.5)
Eosinophils Relative: 3 %
HCT: 30.7 % — ABNORMAL LOW (ref 36.0–46.0)
Hemoglobin: 9.6 g/dL — ABNORMAL LOW (ref 12.0–15.0)
Immature Granulocytes: 0 %
Lymphocytes Relative: 31 %
Lymphs Abs: 1.9 10*3/uL (ref 0.7–4.0)
MCH: 27.5 pg (ref 26.0–34.0)
MCHC: 31.3 g/dL (ref 30.0–36.0)
MCV: 88 fL (ref 80.0–100.0)
Monocytes Absolute: 0.6 10*3/uL (ref 0.1–1.0)
Monocytes Relative: 10 %
Neutro Abs: 3.3 10*3/uL (ref 1.7–7.7)
Neutrophils Relative %: 56 %
Platelet Count: 289 10*3/uL (ref 150–400)
RBC: 3.49 MIL/uL — ABNORMAL LOW (ref 3.87–5.11)
RDW: 15.3 % (ref 11.5–15.5)
WBC Count: 6 10*3/uL (ref 4.0–10.5)
nRBC: 0 % (ref 0.0–0.2)

## 2021-04-01 LAB — FERRITIN: Ferritin: 23 ng/mL (ref 11–307)

## 2021-04-02 LAB — FACTOR 8 ASSAY: Coagulation Factor VIII: 227 % — ABNORMAL HIGH (ref 56–140)

## 2021-04-06 ENCOUNTER — Other Ambulatory Visit: Payer: Self-pay

## 2021-04-06 DIAGNOSIS — I71019 Dissection of thoracic aorta, unspecified: Secondary | ICD-10-CM

## 2021-04-06 DIAGNOSIS — I7100 Dissection of unspecified site of aorta: Secondary | ICD-10-CM

## 2021-04-28 ENCOUNTER — Other Ambulatory Visit: Payer: Self-pay

## 2021-04-28 DIAGNOSIS — D500 Iron deficiency anemia secondary to blood loss (chronic): Secondary | ICD-10-CM

## 2021-04-28 DIAGNOSIS — R310 Gross hematuria: Secondary | ICD-10-CM

## 2021-04-28 NOTE — Progress Notes (Signed)
Patient Care Team: Hoyt Koch, MD as PCP - General (Internal Medicine) [name], MD as PCP - Cardiology (Cardiology) Mcarthur Rossetti, MD (Orthopedic Surgery) Tanda Rockers, MD (Pulmonary Disease) Inda Castle, MD (Inactive) (Gastroenterology) Earnie Larsson, MD as Consulting Physician (Neurosurgery) Charlton Haws, [facility] as Pharmacist (Pharmacist) [name], [place] (Optometry) Loletha Carrow Kirke Corin, MD as Consulting Physician (Gastroenterology) Lorelle Gibbs, MD (Radiology) Paulla Dolly Tamala Fothergill, DPM as Consulting Physician (Podiatry)  DIAGNOSIS:    ICD-10-CM   1. Acute blood loss anemia  D62       CHIEF COMPLIANT: Follow-up of hematuria  INTERVAL HISTORY: Samantha Stevenson is a 70 y.o. with above-mentioned history of hematuria. She is currently on Xarelto for atrial fibrillation and aspirin. Labs on 04/01/21 showed Hg 9.6, HCT 30.7, platelets 289. She reports to the clinic today for follow-up.   ALLERGIES:  is allergic to penicillins, shellfish allergy, latex, banana, kiwi extract, oxycodone, asa [aspirin], celebrex [celecoxib], and nsaids.  MEDICATIONS:  Current Outpatient Medications  Medication Sig Dispense Refill   acetaminophen (TYLENOL) 500 MG tablet Take 1,000 mg by mouth every 8 (eight) hours as needed for moderate pain.     aspirin EC 81 MG tablet Take 81 mg by mouth at bedtime.     diltiazem (DILT-XR) 240 MG 24 hr capsule TAKE 1 CAPSULE(240 MG) BY MOUTH EVERY MORNING 90 capsule 0   ferrous sulfate 325 (65 FE) MG tablet Take 325 mg by mouth once a week.      furosemide (LASIX) 20 MG tablet Take 1 tablet (20 mg total) by mouth daily. Hold until 2/7, start taking 2/7. (Patient taking differently: Take 20 mg by mouth at bedtime.)     Multiple Vitamin (MULTIVITAMIN) tablet Take 2 tablets by mouth daily. Gummies     rivaroxaban (XARELTO) 20 MG TABS tablet Take 1 tablet (20 mg total) by mouth daily with supper. 90 tablet 3    rosuvastatin (CRESTOR) 20 MG tablet TAKE 1 TABLET BY MOUTH EVERY DAY 90 tablet 0   No current facility-administered medications for this visit.    PHYSICAL EXAMINATION: ECOG PERFORMANCE STATUS: 1 - Symptomatic but completely ambulatory  Vitals:   04/29/21 1149  BP: 123/65  Pulse: 87  Resp: 18  Temp: 97.6 F (36.4 C)  SpO2: 100%   Filed Weights   04/29/21 1149  Weight: 166 lb (75.3 kg)    LABORATORY DATA:  I have reviewed the data as listed CMP Latest Ref Rng & Units 03/10/2021 01/20/2021 12/21/2020  Glucose 70 - 99 mg/dL 90 92 90  BUN 8 - 23 mg/dL 38(H) 18 21  Creatinine 0.44 - 1.00 mg/dL 1.65(H) 1.07 0.98  Sodium 135 - 145 mmol/L 138 140 136  Potassium 3.5 - 5.1 mmol/L 4.0 3.6 4.1  Chloride 98 - 111 mmol/L 102 103 101  CO2 22 - 32 mmol/L 25 30 26   Calcium 8.9 - 10.3 mg/dL 9.2 9.2 9.0  Total Protein 6.5 - 8.1 g/dL 7.4 7.1 -  Total Bilirubin 0.3 - 1.2 mg/dL 0.4 0.5 -  Alkaline Phos 38 - 126 U/L 81 92 -  AST 15 - 41 U/L 22 20 -  ALT 0 - 44 U/L 14 12 -    Lab Results  Component Value Date   WBC 6.5 04/29/2021   HGB 9.2 (L) 04/29/2021   HCT 29.2 (L) 04/29/2021   MCV 87.2 04/29/2021   PLT 275 04/29/2021   NEUTROABS 4.1 04/29/2021    ASSESSMENT & PLAN:  Acute blood loss anemia Recurrent hematuria since January 2022: Status post urological procedures.  It appears that follow-up scopes did not reveal any source of bleeding.  She develops these bleeding symptoms every time she exerts.  Prior treatments: Aspirin and Xarelto (aspirin was discontinued 03/31/2021), takes Xarelto for atrial fibrillation  Longstanding history of anemia her baseline hemoglobin is around 10-11  03/10/2021 showed a hemoglobin of 9.8 04/01/2021: Hemoglobin 9.6, ferritin 23, iron saturation 10%, factor VIII-227% 04/29/2021: Hemoglobin 9.2  Since stopping aspirin, the bleeding did not subside. My recommendation is to stop Xarelto at this time. If the bleeding stops in 2 weeks then we can resume  Xarelto at that time. I recommended iron supplementation.  Patient was informed that she has chronic kidney disease stage III.  This is based upon a creatinine of 1.6 performed in May 2022.  I would like to recheck this to ensure the validity of the results.  I suspect that she may have been dehydrated in May.  Return to clinic in 2 weeks with labs and follow-up  No orders of the defined types were placed in this encounter.  The patient has a good understanding of the overall plan. she agrees with it. she will call with any problems that may develop before the next visit here.  Total time spent: 20 mins including face to face time and time spent for planning, charting and coordination of care  Rulon Eisenmenger, MD, MPH 04/29/2021  I, Thana Ates, am acting as scribe for Dr. Nicholas Lose.  I have reviewed the above documentation for accuracy and completeness, and I agree with the above.

## 2021-04-29 ENCOUNTER — Other Ambulatory Visit: Payer: Self-pay

## 2021-04-29 ENCOUNTER — Inpatient Hospital Stay: Payer: Medicare Other | Attending: Hematology and Oncology

## 2021-04-29 ENCOUNTER — Inpatient Hospital Stay (HOSPITAL_BASED_OUTPATIENT_CLINIC_OR_DEPARTMENT_OTHER): Payer: Medicare Other | Admitting: Hematology and Oncology

## 2021-04-29 DIAGNOSIS — R310 Gross hematuria: Secondary | ICD-10-CM

## 2021-04-29 DIAGNOSIS — D500 Iron deficiency anemia secondary to blood loss (chronic): Secondary | ICD-10-CM

## 2021-04-29 DIAGNOSIS — D62 Acute posthemorrhagic anemia: Secondary | ICD-10-CM

## 2021-04-29 DIAGNOSIS — Z7901 Long term (current) use of anticoagulants: Secondary | ICD-10-CM | POA: Diagnosis not present

## 2021-04-29 DIAGNOSIS — I4891 Unspecified atrial fibrillation: Secondary | ICD-10-CM | POA: Insufficient documentation

## 2021-04-29 DIAGNOSIS — Z79899 Other long term (current) drug therapy: Secondary | ICD-10-CM | POA: Insufficient documentation

## 2021-04-29 LAB — CBC WITH DIFFERENTIAL (CANCER CENTER ONLY)
Abs Immature Granulocytes: 0.01 10*3/uL (ref 0.00–0.07)
Basophils Absolute: 0 10*3/uL (ref 0.0–0.1)
Basophils Relative: 0 %
Eosinophils Absolute: 0.1 10*3/uL (ref 0.0–0.5)
Eosinophils Relative: 2 %
HCT: 29.2 % — ABNORMAL LOW (ref 36.0–46.0)
Hemoglobin: 9.2 g/dL — ABNORMAL LOW (ref 12.0–15.0)
Immature Granulocytes: 0 %
Lymphocytes Relative: 27 %
Lymphs Abs: 1.7 10*3/uL (ref 0.7–4.0)
MCH: 27.5 pg (ref 26.0–34.0)
MCHC: 31.5 g/dL (ref 30.0–36.0)
MCV: 87.2 fL (ref 80.0–100.0)
Monocytes Absolute: 0.6 10*3/uL (ref 0.1–1.0)
Monocytes Relative: 8 %
Neutro Abs: 4.1 10*3/uL (ref 1.7–7.7)
Neutrophils Relative %: 63 %
Platelet Count: 275 10*3/uL (ref 150–400)
RBC: 3.35 MIL/uL — ABNORMAL LOW (ref 3.87–5.11)
RDW: 15.9 % — ABNORMAL HIGH (ref 11.5–15.5)
WBC Count: 6.5 10*3/uL (ref 4.0–10.5)
nRBC: 0 % (ref 0.0–0.2)

## 2021-04-29 NOTE — Assessment & Plan Note (Signed)
Recurrent hematuria since January 2022: Status post urological procedures.  It appears that follow-up scopes did not reveal any source of bleeding.  She develops these bleeding symptoms every time she exerts.  Prior treatments: Aspirin and Xarelto (aspirin was discontinued 03/31/2021), takes Xarelto for atrial fibrillation  Longstanding history of anemia her baseline hemoglobin is around 10-11  03/10/2021 showed a hemoglobin of 9.8 04/01/2021: Hemoglobin 9.6, ferritin 23, iron saturation 10%, factor VIII-227%  I recommended iron supplementation.

## 2021-05-03 ENCOUNTER — Other Ambulatory Visit: Payer: Medicare Other

## 2021-05-03 ENCOUNTER — Other Ambulatory Visit: Payer: Self-pay | Admitting: Vascular Surgery

## 2021-05-03 ENCOUNTER — Ambulatory Visit
Admission: RE | Admit: 2021-05-03 | Discharge: 2021-05-03 | Disposition: A | Discharge status: Home / Self Care | Payer: Medicare Other | Source: Ambulatory Visit | Attending: Vascular Surgery | Admitting: Vascular Surgery

## 2021-05-03 DIAGNOSIS — I714 Abdominal aortic aneurysm, without rupture: Secondary | ICD-10-CM | POA: Diagnosis not present

## 2021-05-03 DIAGNOSIS — I7101 Dissection of thoracic aorta: Secondary | ICD-10-CM

## 2021-05-03 DIAGNOSIS — I71019 Dissection of thoracic aorta, unspecified: Secondary | ICD-10-CM

## 2021-05-03 DIAGNOSIS — I7100 Dissection of unspecified site of aorta: Secondary | ICD-10-CM

## 2021-05-03 DIAGNOSIS — I723 Aneurysm of iliac artery: Secondary | ICD-10-CM | POA: Diagnosis not present

## 2021-05-03 DIAGNOSIS — N132 Hydronephrosis with renal and ureteral calculous obstruction: Secondary | ICD-10-CM | POA: Diagnosis not present

## 2021-05-03 DIAGNOSIS — I774 Celiac artery compression syndrome: Secondary | ICD-10-CM | POA: Diagnosis not present

## 2021-05-03 DIAGNOSIS — I2510 Atherosclerotic heart disease of native coronary artery without angina pectoris: Secondary | ICD-10-CM | POA: Diagnosis not present

## 2021-05-03 DIAGNOSIS — K8689 Other specified diseases of pancreas: Secondary | ICD-10-CM | POA: Diagnosis not present

## 2021-05-03 MED ORDER — IOPAMIDOL (ISOVUE-370) INJECTION 76%
75.0000 mL | Freq: Once | INTRAVENOUS | Status: AC | PRN
  Administered 2021-05-03: 75 mL via INTRAVENOUS

## 2021-05-05 ENCOUNTER — Ambulatory Visit (INDEPENDENT_AMBULATORY_CARE_PROVIDER_SITE_OTHER): Payer: Medicare Other | Admitting: Pharmacist

## 2021-05-05 ENCOUNTER — Other Ambulatory Visit: Payer: Self-pay

## 2021-05-05 DIAGNOSIS — I480 Paroxysmal atrial fibrillation: Secondary | ICD-10-CM | POA: Diagnosis not present

## 2021-05-05 DIAGNOSIS — D500 Iron deficiency anemia secondary to blood loss (chronic): Secondary | ICD-10-CM

## 2021-05-05 DIAGNOSIS — E785 Hyperlipidemia, unspecified: Secondary | ICD-10-CM

## 2021-05-05 DIAGNOSIS — I10 Essential (primary) hypertension: Secondary | ICD-10-CM | POA: Diagnosis not present

## 2021-05-05 DIAGNOSIS — I5032 Chronic diastolic (congestive) heart failure: Secondary | ICD-10-CM

## 2021-05-05 MED ORDER — DILTIAZEM HCL ER 240 MG PO CP24: ORAL_CAPSULE | ORAL | 0 refills | Status: DC

## 2021-05-05 NOTE — Progress Notes (Signed)
Chronic Care Management Pharmacy Note  05/06/2021 Name:  Samantha Stevenson MRN:  [id number] DOB:  1950-12-14  Summary: -Hematuria resolved 1 day after stopping Xarelto (04/30/21) -Iron infusions being considered with hematology   Recommendations/Changes made from today's visit: -Continue holding Xarelto and aspirin -Keep f/u appt with Dr Donzetta Matters and Dr Lindi Adie 05/13/21   Subjective: Samantha Stevenson is an 70 y.o. year old female who is a primary patient of Hoyt Koch, MD.  The CCM team was consulted for assistance with disease management and care coordination needs.    Engaged with patient by telephone for follow up visit in response to provider referral for pharmacy case management and/or care coordination services.   Consent to Services:  The patient was given information about Chronic Care Management services, agreed to services, and gave verbal consent prior to initiation of services.  Please see initial visit note for detailed documentation.   Patient Care Team: Hoyt Koch, MD as PCP - General (Internal Medicine) [name], MD as PCP - Cardiology (Cardiology) Mcarthur Rossetti, MD (Orthopedic Surgery) Tanda Rockers, MD (Pulmonary Disease) Inda Castle, MD (Inactive) (Gastroenterology) Earnie Larsson, MD as Consulting Physician (Neurosurgery) Charlton Haws, [facility] as Pharmacist (Pharmacist) [name], [place] (Optometry) Loletha Carrow Kirke Corin, MD as Consulting Physician (Gastroenterology) Lorelle Gibbs, MD (Radiology) Paulla Dolly Tamala Fothergill, DPM as Consulting Physician (Podiatry)  Recent office visits: 02/14/21 Dr Sharlet Salina OV: acute visit for ongoing hematuria. Consulted w/ Camnitz, reduce Xarelto to 10 mg and monitor bleeding.  Recent consult visits: 04/29/21 Dr Lindi Adie (heme/onc): f/u recurrent hematuria since Jan 2022; aspirin dc'd 03/31/21; bleeding did not stop after stopping aspirin; stop Xarelto x 2 weeks, if bleeding stops can  resume then. Rec'd iron  03/31/21 Dr Lindi Adie (heme/onc): f/u recurrent hematuria. stop aspirin x 4 weeks and reassess   03/23/21 Dr Gaynelle Arabian [facility] urology): eval for hematuria. No significant stones. Referred to heme/onc  Hospital visits: Medication Reconciliation was completed by comparing discharge summary, patient's EMR and Pharmacy list, and upon discussion with patient.  Admitted to the ED on 03/10/21 due to chest pain. Discharge date was 03/10/21. Discharged from [facility].    Medications that remain the same after Hospital Discharge:??  -All other medications will remain the same.     Objective:  Lab Results  Component Value Date   CREATININE 1.65 (H) 03/10/2021   BUN 38 (H) 03/10/2021   GFR 53.54 (L) 01/20/2021   GFRNONAA 34 (L) 03/10/2021   GFRAA >60 01/29/2020   NA 138 03/10/2021   K 4.0 03/10/2021   CALCIUM 9.2 03/10/2021   CO2 25 03/10/2021   GLUCOSE 90 03/10/2021    Lab Results  Component Value Date/Time   HGBA1C 6.2 06/19/2016 11:51 AM   HGBA1C 6.1 06/23/2015 11:20 AM   GFR 53.54 (L) 01/20/2021 09:24 AM   GFR 59.52 (L) 12/21/2020 03:51 PM    Last diabetic Eye exam: No results found for: HMDIABEYEEXA  Last diabetic Foot exam: No results found for: HMDIABFOOTEX   Lab Results  Component Value Date   CHOL 189 01/03/2018   HDL 46.70 01/03/2018   LDLCALC 117 (H) 01/03/2018   TRIG 128.0 01/03/2018   CHOLHDL 4 01/03/2018    Hepatic Function Latest Ref Rng & Units 03/10/2021 01/20/2021 12/02/2020  Total Protein 6.5 - 8.1 g/dL 7.4 7.1 7.0  Albumin 3.5 - 5.0 g/dL 3.6 3.9 3.8  AST 15 - 41 U/L _0 ALT 0 - 44 U/L 14 12  11  Alk Phosphatase 38 - 126 U/L 81 92 93  Total Bilirubin 0.3 - 1.2 mg/dL 0.4 0.5 0.5  Bilirubin, Direct 0.0 - 0.2 mg/dL <0.1 - -    Lab Results  Component Value Date/Time   TSH 1.43 12/21/2020 03:51 PM   TSH 2.24 08/27/2013 10:02 AM    CBC Latest Ref Rng & Units 04/29/2021 04/01/2021 03/10/2021  WBC 4.0 - 10.5 K/uL 6.5 6.0 6.2   Hemoglobin 12.0 - 15.0 g/dL 9.2(L) 9.6(L) 9.8(L)  Hematocrit 36.0 - 46.0 % 29.2(L) 30.7(L) 31.7(L)  Platelets 150 - 400 K/uL 275 289 359    No results found for: VD25OH  Clinical ASCVD: Yes  The ASCVD Risk score Mikey Bussing DC Jr., et al., 2013) failed to calculate for the following reasons:   The patient has a prior MI or stroke diagnosis    Depression screen [facility] 2/9 04/12/2020 04/08/2019 03/28/2018  Decreased Interest 0 0 0  Down, Depressed, Hopeless 0 0 1  PHQ - 2 Score 0 0 1  Altered sleeping - - 2  Tired, decreased energy - - 1  Change in appetite - - 0  Feeling bad or failure about yourself  - - 0  Trouble concentrating - - 0  Moving slowly or fidgety/restless - - 0  Suicidal thoughts - - 0  PHQ-9 Score - - 4  Difficult doing work/chores - - Not difficult at all  Some recent data might be hidden    CHA2DS2-VASc Score = 5  The patient's score is based upon: CHF History: Yes HTN History: Yes Diabetes History: No Stroke History: No Vascular Disease History: Yes Age Score: 1 Gender Score: 1     Social History   Tobacco Use  Smoking Status Never  Smokeless Tobacco Never   BP Readings from Last 3 Encounters:  04/29/21 123/65  03/31/21 (!) 121/52  03/10/21 119/63   Pulse Readings from Last 3 Encounters:  04/29/21 87  03/31/21 75  03/10/21 71   Wt Readings from Last 3 Encounters:  04/29/21 166 lb (75.3 kg)  03/31/21 169 lb 12.8 oz (77 kg)  03/10/21 165 lb (74.8 kg)   BMI Readings from Last 3 Encounters:  04/29/21 28.49 kg/m  03/31/21 29.15 kg/m  03/10/21 28.32 kg/m    Assessment/Interventions: Review of patient past medical history, allergies, medications, health status, including review of consultants reports, laboratory and other test data, was performed as part of comprehensive evaluation and provision of chronic care management services.   SDOH:  (Social Determinants of Health) assessments and interventions performed: Yes  SDOH Screenings   Alcohol  Screen: Not on file  Depression (PHQ2-9): Not on file  Financial Resource Strain: Not on file  Food Insecurity: Not on file  Housing: Not on file  Physical Activity: Not on file  Social Connections: Not on file  Stress: Not on file  Tobacco Use: Low Risk    Smoking Tobacco Use: Never   Smokeless Tobacco Use: Never  Transportation Needs: Not on file    CCM Care Plan  Allergies  Allergen Reactions   Penicillins Swelling and Other (See Comments)    Severe swelling PATIENT HAS HAD A PCN REACTION WITH IMMEDIATE RASH, FACIAL/TONGUE/THROAT SWELLING, SOB, OR LIGHTHEADEDNESS WITH HYPOTENSION:  #  #  YES  #  #  Has patient had a PCN reaction causing severe rash involving mucus membranes or skin necrosis: No PATIENT HAS HAD A PCN REACTION THAT REQUIRED HOSPITALIZATION:  #  #  YES  #  #  Has patient had a PCN reaction occurring within the last 10 years: No   Shellfish Allergy Anaphylaxis   Latex Hives and Rash   Banana Nausea Only   Kiwi Extract    Oxycodone Other (See Comments)    Pt stated, "Made me feel really drowsy" Tolerating as of 02/16/20 after neck surgery   Asa [Aspirin] Nausea And Vomiting    Makes stomach upset Asprin 325   Celebrex [Celecoxib] Nausea And Vomiting    GI upset   Nsaids Rash    Medications Reviewed Today     Reviewed by Charlton Haws, [facility] (Pharmacist) on 05/05/21 at 1544  Med List Status: <None>   Medication Order Taking? Sig Documenting Provider Last Dose Status Informant  acetaminophen (TYLENOL) 500 MG tablet [id number] Yes Take 1,000 mg by mouth every 8 (eight) hours as needed for moderate pain. [provider] Taking Active Self  aspirin EC 81 MG tablet [id number] No Take 81 mg by mouth at bedtime.  Patient not taking: Reported on 05/05/2021   [provider] Not Taking Active Self           Med Note Rolan Bucco May 05, 2021  3:43 PM) On hold 03/31/21 --  diltiazem (DILT-XR) 240 MG 24 hr capsule [id number] Yes TAKE  1 CAPSULE(240 MG) BY MOUTH EVERY MORNING Hoyt Koch, MD Taking Active Self  furosemide (LASIX) 20 MG tablet [id number] Yes Take 1 tablet (20 mg total) by mouth daily. Hold until 2/7, start taking 2/7.  Patient taking differently: Take 20 mg by mouth at bedtime.   Samuella Cota, MD Taking Active Self  Multiple Vitamin (MULTIVITAMIN) tablet [id number] Yes Take 2 tablets by mouth daily. Gummies [provider] Taking Active Self  rivaroxaban (XARELTO) 20 MG TABS tablet [id number] No Take 1 tablet (20 mg total) by mouth daily with supper.  Patient not taking: Reported on 05/05/2021   Hoyt Koch, MD Not Taking Active            Med Note Rolan Bucco May 05, 2021  3:43 PM) On hold 04/29/21 --  rosuvastatin (CRESTOR) 20 MG tablet [id number] Yes TAKE 1 TABLET BY MOUTH EVERY DAY Biagio Borg, MD Taking Active             Patient Active Problem List   Diagnosis Date Noted   Chronic diastolic CHF (congestive heart failure) ([place]) 11/24/2020   Acute blood loss anemia 11/23/2020   Acute lateral meniscal tear, left, subsequent encounter 11/16/2020   Cervical spondylosis with myelopathy and radiculopathy 02/02/2020   Chronic neck pain 07/17/2019   LLQ pain 05/16/2019   Chronic bilateral low back pain without sciatica 10/07/2018   Pain and swelling of left lower leg 08/08/2018   Left arm swelling 08/08/2018   Hematuria, gross 07/29/2018   Thoracic aortic dissection ([place]) 06/13/2018   Intramural aortic hematoma ([place]) 05/28/2018   Dysfunction of left eustachian tube 04/05/2018   Preoperative clearance 02/27/2018   Bilateral leg edema 02/27/2018   DOE (dyspnea on exertion) 06/19/2016   Routine general medical examination at a health care facility 06/24/2015   Constipation 03/17/2015   Myalgia and myositis 03/17/2015   Dyslipidemia    Atrial fibrillation ([place])    Hypertension    History of pulmonary embolism 01/10/2013   Anemia 01/10/2013   Arthritis  01/03/2013    Immunization History  Administered Date(s) Administered   Fluad Quad(high Dose 65+) 07/02/2019, 07/22/2020   Influenza Whole  07/23/2012   Influenza, High Dose Seasonal PF 07/04/2018   Influenza,inj,Quad PF,6+ Mos 07/16/2013, 06/19/2016   PFIZER(Purple Top)SARS-COV-2 Vaccination 06/12/2020, 07/03/2020   Pneumococcal Conjugate-13 01/03/2018   Pneumococcal Polysaccharide-23 04/08/2019   Td 10/24/2011    Conditions to be addressed/monitored:  Hypertension, Hyperlipidemia, Atrial Fibrillation, and Heart Failure, anemia  Care Plan : [place]  Updates made by Charlton Haws, RPH since 05/06/2021 12:00 AM     Problem: Hypertension, Hyperlipidemia, Atrial Fibrillation, and Heart Failure, Anemia   Priority: High     Long-Range Goal: Disease management   Start Date: 05/06/2021  Expected End Date: 05/06/2022  This Visit's Progress: On track  Priority: High  Note:   Current Barriers:  Unable to independently monitor therapeutic efficacy  Pharmacist Clinical Goal(s):  Patient will achieve adherence to monitoring guidelines and medication adherence to achieve therapeutic efficacy through collaboration with PharmD and provider.   Interventions: 1:1 collaboration with Hoyt Koch, MD regarding development and update of comprehensive plan of care as evidenced by provider attestation and co-signature Inter-disciplinary care team collaboration (see longitudinal plan of care) Comprehensive medication review performed; medication list updated in electronic medical record  AFIB/Hypertension    Patient is currently rate controlled.  BP goal < 130/80 Patient checks BP at home infrequently Patient home BP readings are ranging: "normal"   Patient has failed these meds in past: digoxin Patient is currently controlled on the following medications: Diltiazem 240 mg qAM, Xarelto 20 mg daily - on hold furosemide 20 mg daily   We discussed:  Pt reports  hematuria has finally stopped after stopping Xarelto 7/8 (per heme/onc); the plan is to restart Xarelto after resolution of bleeding, pt has f/u with heme/onc 7/22.   Plan: Continue current medications; keep appt with heme/onc re: holding Xarelto   Hyperlipidemia/CAD    LDL goal < 70 Hx MI (2000), carotid artery disease w/ stent   Patient has failed these meds in past: n/a Patient is currently controlled on the following medications: rosuvastatin 20 mg daily aspirin 81 mg daily - on hold   We discussed:  Pt reports compliance with rosuvastain; aspirin has been on hold since June due to hematuria;  -last lipid panel was 12/2017, LDL was above goal at that time, pt has been on rosuvastatin 20 mg since 2017; pt is due for repeat lipid panel, may consider increasing rosuvastatin vs adding ezetimibe depending on results   Plan Continue current medications and control with diet and exercise  Repeat lipid panel due   Anemia    Patient has failed these meds in past: n/a Patient is currently controlled on the following medications: ferrous sulfate 325 mg once weekly - not taking   We discussed:  Pt may require iron infusion; she is following with heme/onc for this   Plan: F/U with hematology re: iron infusions  Patient Goals/Self-Care Activities Patient will:  - take medications as prescribed focus on medication adherence by routine -Continue holding Xarelto and aspirin until f/u with Dr Lindi Adie -Schedule physical with PCP at your convenience - cholesterol test needed      Medication Assistance: None required.  Patient affirms current coverage meets needs.  Compliance/Adherence/Medication fill history: Care Gaps: Shingrix Covid booster (due 08/10/20)  Star-Rating Drugs: Rosuvastatin - LF 02/03/21 x 90 ds  Patient's preferred pharmacy is:  [id number]: [phone]:  [phone] Uses pill box? Yes Pt endorses  100% compliance  We discussed: Benefits of medication synchronization, packaging and delivery as well as enhanced pharmacist oversight with [employer]. Patient decided to: Continue current medication management strategy  Care Plan and Follow Up Patient Decision:  Patient agrees to Care Plan and Follow-up.  Plan: Telephone follow up appointment with care management team member scheduled for:  6 months  Charlene Brooke, PharmD, [place], CPP Clinical Pharmacist [facility] at [facility] [phone]

## 2021-05-06 NOTE — Patient Instructions (Signed)
Visit Information  Phone number for Pharmacist: [phone]   Goals Addressed             This Visit's Progress    Manage My Medicine       Timeframe:  Long-Range Goal Priority:  Medium Start Date:      05/05/21                       Expected End Date:  714/22                     Follow Up Date Jan 2023   - call for medicine refill 2 or 3 days before it runs out - call if I am sick and can't take my medicine - keep a list of all the medicines I take; vitamins and herbals too - use a pillbox to sort medicine    Why is this important?   These steps will help you keep on track with your medicines.   Notes:         Patient verbalizes understanding of instructions provided today and agrees to view in [place].  Telephone follow up appointment with pharmacy team member scheduled for: 6 months  Charlene Brooke, PharmD, [place], CPP Clinical Pharmacist [facility] at [facility] [phone]

## 2021-05-10 DIAGNOSIS — Z23 Encounter for immunization: Secondary | ICD-10-CM | POA: Diagnosis not present

## 2021-05-12 DIAGNOSIS — L718 Other rosacea: Secondary | ICD-10-CM | POA: Diagnosis not present

## 2021-05-12 DIAGNOSIS — L820 Inflamed seborrheic keratosis: Secondary | ICD-10-CM | POA: Diagnosis not present

## 2021-05-12 NOTE — Progress Notes (Signed)
Patient Care Team: Hoyt Koch, MD as PCP - General (Internal Medicine) [name], MD as PCP - Cardiology (Cardiology) Mcarthur Rossetti, MD (Orthopedic Surgery) Tanda Rockers, MD (Pulmonary Disease) Inda Castle, MD (Inactive) (Gastroenterology) Earnie Larsson, MD as Consulting Physician (Neurosurgery) Charlton Haws, [facility] as Pharmacist (Pharmacist) [name], [place] (Optometry) Loletha Carrow Kirke Corin, MD as Consulting Physician (Gastroenterology) Lorelle Gibbs, MD (Radiology) Paulla Dolly Tamala Fothergill, DPM as Consulting Physician (Podiatry)  DIAGNOSIS:    ICD-10-CM   1. Acute blood loss anemia  D62       CHIEF COMPLIANT: Follow-up of hematuria  INTERVAL HISTORY: Samantha Stevenson is a 70 y.o. with above-mentioned history of  hematuria. She was on Xarelto for atrial fibrillation and aspirin. Labs on 04/29/21 showed Hg 9.2, HCT 29.2, platelets 275 . She reports to the clinic today for follow-up.  We stopped both Xarelto and aspirin and finally her bleeding subsided.  She has been tired but she is able to go to the gym and exercise regularly.   ALLERGIES:  is allergic to penicillins, shellfish allergy, latex, banana, kiwi extract, oxycodone, asa [aspirin], celebrex [celecoxib], and nsaids.  MEDICATIONS:  Current Outpatient Medications  Medication Sig Dispense Refill   acetaminophen (TYLENOL) 500 MG tablet Take 1,000 mg by mouth every 8 (eight) hours as needed for moderate pain.     aspirin EC 81 MG tablet Take 81 mg by mouth at bedtime. (Patient not taking: No sig reported)     B Complex Vitamins (VITAMIN B COMPLEX) TABS Take 1 tablet by mouth daily.     diltiazem (DILT-XR) 240 MG 24 hr capsule TAKE 1 CAPSULE(240 MG) BY MOUTH EVERY MORNING 90 capsule 0   furosemide (LASIX) 20 MG tablet Take 1 tablet (20 mg total) by mouth daily. Hold until 2/7, start taking 2/7. (Patient taking differently: Take 20 mg by mouth at bedtime.)     Multiple Vitamin  (MULTIVITAMIN) tablet Take 2 tablets by mouth daily. Gummies     rivaroxaban (XARELTO) 20 MG TABS tablet Take 1 tablet (20 mg total) by mouth daily with supper. (Patient not taking: No sig reported) 90 tablet 3   rosuvastatin (CRESTOR) 20 MG tablet TAKE 1 TABLET BY MOUTH EVERY DAY 90 tablet 0   No current facility-administered medications for this visit.    PHYSICAL EXAMINATION: ECOG PERFORMANCE STATUS: 1 - Symptomatic but completely ambulatory  Vitals:   05/13/21 1434  BP: 124/64  Pulse: 65  Resp: 18  Temp: 97.6 F (36.4 C)  SpO2: 100%   Filed Weights   05/13/21 1434  Weight: 169 lb 4.8 oz (76.8 kg)    LABORATORY DATA:  I have reviewed the data as listed CMP Latest Ref Rng & Units 03/10/2021 01/20/2021 12/21/2020  Glucose 70 - 99 mg/dL 90 92 90  BUN 8 - 23 mg/dL 38(H) 18 21  Creatinine 0.44 - 1.00 mg/dL 1.65(H) 1.07 0.98  Sodium 135 - 145 mmol/L 138 140 136  Potassium 3.5 - 5.1 mmol/L 4.0 3.6 4.1  Chloride 98 - 111 mmol/L 102 103 101  CO2 22 - 32 mmol/L 25 30 26   Calcium 8.9 - 10.3 mg/dL 9.2 9.2 9.0  Total Protein 6.5 - 8.1 g/dL 7.4 7.1 -  Total Bilirubin 0.3 - 1.2 mg/dL 0.4 0.5 -  Alkaline Phos 38 - 126 U/L 81 92 -  AST 15 - 41 U/L 22 20 -  ALT 0 - 44 U/L 14 12 -    Lab Results  Component  Value Date   WBC 5.1 05/13/2021   HGB 8.7 (L) 05/13/2021   HCT 28.5 (L) 05/13/2021   MCV 89.3 05/13/2021   PLT 268 05/13/2021   NEUTROABS 2.5 05/13/2021    ASSESSMENT & PLAN:  Acute blood loss anemia Recurrent hematuria since January 2022: Status post urological procedures.  It appears that follow-up scopes did not reveal any source of bleeding.  She develops these bleeding symptoms every time she exerts.   Prior treatments: Aspirin and Xarelto (aspirin was discontinued 03/31/2021), takes Xarelto for atrial fibrillation (stopped 04/29/2021)   Longstanding history of anemia her baseline hemoglobin is around 10-11  03/10/2021 showed a hemoglobin of 9.8 04/01/2021: Hemoglobin 9.6,  ferritin 23, iron saturation 10%, factor VIII-227% 04/29/2021: Hemoglobin 9.2 05/13/2021: Hemoglobin 8.7   Patient reports that when she stopped Xarelto finally the bleeding subsided. I am concerned that the hemoglobin still continues to decline. I recommended that she see her cardiologist to discuss anticoagulation. I will recheck her back in 1 month and check iron levels as well.  If necessary will give her IV iron because she is intolerant of oral iron therapy.  No orders of the defined types were placed in this encounter.  The patient has a good understanding of the overall plan. she agrees with it. she will call with any problems that may develop before the next visit here.  Total time spent: 20 mins including face to face time and time spent for planning, charting and coordination of care  Rulon Eisenmenger, MD, MPH this feel like fullness anemia sinus infection like middle ear do take any Sudafed or anything or Claritin Need to take her week will be week goodness 05/13/2021  I, Thana Ates, am acting as scribe for Dr. Nicholas Lose.  I have reviewed the above documentation for accuracy and completeness, and I agree with the above.

## 2021-05-13 ENCOUNTER — Telehealth: Payer: Self-pay | Admitting: Cardiology

## 2021-05-13 ENCOUNTER — Ambulatory Visit (INDEPENDENT_AMBULATORY_CARE_PROVIDER_SITE_OTHER): Payer: Medicare Other | Admitting: Vascular Surgery

## 2021-05-13 ENCOUNTER — Encounter: Payer: Self-pay | Admitting: Vascular Surgery

## 2021-05-13 ENCOUNTER — Ambulatory Visit (HOSPITAL_COMMUNITY)
Admission: RE | Admit: 2021-05-13 | Discharge: 2021-05-13 | Disposition: A | Discharge status: Home / Self Care | Payer: Medicare Other | Source: Ambulatory Visit | Attending: Vascular Surgery | Admitting: Vascular Surgery

## 2021-05-13 ENCOUNTER — Other Ambulatory Visit: Payer: Self-pay

## 2021-05-13 ENCOUNTER — Inpatient Hospital Stay: Payer: Medicare Other

## 2021-05-13 ENCOUNTER — Inpatient Hospital Stay (HOSPITAL_BASED_OUTPATIENT_CLINIC_OR_DEPARTMENT_OTHER): Payer: Medicare Other | Admitting: Hematology and Oncology

## 2021-05-13 VITALS — BP 137/75 | HR 61 | Temp 98.0°F | Resp 20 | Ht 64.0 in | Wt 168.0 lb

## 2021-05-13 DIAGNOSIS — D62 Acute posthemorrhagic anemia: Secondary | ICD-10-CM

## 2021-05-13 DIAGNOSIS — Z7901 Long term (current) use of anticoagulants: Secondary | ICD-10-CM | POA: Diagnosis not present

## 2021-05-13 DIAGNOSIS — I7100 Dissection of unspecified site of aorta: Secondary | ICD-10-CM

## 2021-05-13 DIAGNOSIS — Z79899 Other long term (current) drug therapy: Secondary | ICD-10-CM | POA: Diagnosis not present

## 2021-05-13 DIAGNOSIS — I6529 Occlusion and stenosis of unspecified carotid artery: Secondary | ICD-10-CM | POA: Diagnosis not present

## 2021-05-13 DIAGNOSIS — I4891 Unspecified atrial fibrillation: Secondary | ICD-10-CM | POA: Diagnosis not present

## 2021-05-13 LAB — CMP (CANCER CENTER ONLY)
ALT: 14 U/L (ref 0–44)
AST: 19 U/L (ref 15–41)
Albumin: 3.3 g/dL — ABNORMAL LOW (ref 3.5–5.0)
Alkaline Phosphatase: 88 U/L (ref 38–126)
Anion gap: 7 (ref 5–15)
BUN: 19 mg/dL (ref 8–23)
CO2: 28 mmol/L (ref 22–32)
Calcium: 9.3 mg/dL (ref 8.9–10.3)
Chloride: 108 mmol/L (ref 98–111)
Creatinine: 1.13 mg/dL — ABNORMAL HIGH (ref 0.44–1.00)
GFR, Estimated: 53 mL/min — ABNORMAL LOW (ref >60)
Glucose, Bld: 82 mg/dL (ref 70–99)
Potassium: 4 mmol/L (ref 3.5–5.1)
Sodium: 143 mmol/L (ref 135–145)
Total Bilirubin: 0.4 mg/dL (ref 0.3–1.2)
Total Protein: 7.1 g/dL (ref 6.5–8.1)

## 2021-05-13 LAB — CBC WITH DIFFERENTIAL (CANCER CENTER ONLY)
Abs Immature Granulocytes: 0 10*3/uL (ref 0.00–0.07)
Basophils Absolute: 0 10*3/uL (ref 0.0–0.1)
Basophils Relative: 0 %
Eosinophils Absolute: 0.2 10*3/uL (ref 0.0–0.5)
Eosinophils Relative: 4 %
HCT: 28.5 % — ABNORMAL LOW (ref 36.0–46.0)
Hemoglobin: 8.7 g/dL — ABNORMAL LOW (ref 12.0–15.0)
Immature Granulocytes: 0 %
Lymphocytes Relative: 35 %
Lymphs Abs: 1.8 10*3/uL (ref 0.7–4.0)
MCH: 27.3 pg (ref 26.0–34.0)
MCHC: 30.5 g/dL (ref 30.0–36.0)
MCV: 89.3 fL (ref 80.0–100.0)
Monocytes Absolute: 0.6 10*3/uL (ref 0.1–1.0)
Monocytes Relative: 12 %
Neutro Abs: 2.5 10*3/uL (ref 1.7–7.7)
Neutrophils Relative %: 49 %
Platelet Count: 268 10*3/uL (ref 150–400)
RBC: 3.19 MIL/uL — ABNORMAL LOW (ref 3.87–5.11)
RDW: 16 % — ABNORMAL HIGH (ref 11.5–15.5)
WBC Count: 5.1 10*3/uL (ref 4.0–10.5)
nRBC: 0 % (ref 0.0–0.2)

## 2021-05-13 LAB — APTT: aPTT: 30 seconds (ref 24–36)

## 2021-05-13 LAB — PROTIME-INR
INR: 1 (ref 0.8–1.2)
Prothrombin Time: 13.5 seconds (ref 11.4–15.2)

## 2021-05-13 NOTE — Telephone Encounter (Signed)
Spoke with pt  and Xarelto has been stopped due to bleeding  Xarelto was stopped on 04/29/21 and per pt bleeding stopped 3 days later Dr Lindi Adie stopped med and is wanting pt to find out if should restart  HGB 8.7 from today Will forward to Dr Camnitz./cy

## 2021-05-13 NOTE — Telephone Encounter (Signed)
Pt c/o medication issue:  1. Name of Medication:  rivaroxaban (XARELTO) 20 MG TABS tablet aspirin EC 81 MG tablet  2. How are you currently taking this medication (dosage and times per day)? 1 tablet daily of each  3. Are you having a reaction (difficulty breathing--STAT)? no  4. What is your medication issue? Patient states she was urinating blood and was taken off xarelto and aspirin. She states the bleeding stopped and would like to know if she needs to stay off the medication or start it again.

## 2021-05-13 NOTE — Assessment & Plan Note (Signed)
Recurrent hematuria since January 2022: Status post urological procedures. It appears that follow-up scopes did not reveal any source of bleeding. She develops these bleeding symptoms every time she exerts.  Prior treatments: Aspirin and Xarelto (aspirin was discontinued 03/31/2021), takes Xarelto for atrial fibrillation (stopped 04/29/2021)  Longstanding history of anemia her baseline hemoglobin is around 10-11 03/10/2021 showed a hemoglobin of 9.8 04/01/2021: Hemoglobin 9.6, ferritin 23, iron saturation 10%, factor VIII-227% 04/29/2021: Hemoglobin 9.2 05/13/2021:  Since stopping aspirin, the bleeding did not subside. I recommended iron supplementation.

## 2021-05-13 NOTE — Progress Notes (Signed)
Patient ID: Samantha Stevenson, female   DOB: 08-18-1951, 70 y.o.   MRN: [id number]  Reason for Consult: Follow-up   Referred by Hoyt Koch, *  Subjective:     HPI:  Samantha Stevenson is a 70 y.o. female has a previous history of thoracic endograft with transposition of the subclavian artery to the common carotid artery and stenting of an IMA to the left common carotid artery.  This was complicated with lymphatic leak underwent drainage as well as medium chain fatty acid diet.  Previously was on anticoagulation now only taking aspirin.  She has no stroke, TIA or amaurosis.  She did recently have blood in her urine which has resolved likely secondary t kidney stones.  At this time she has no limitations she has progressed very well with no complaints at today's visit.  Past Medical History:  Diagnosis Date   Allergy    Anemia    Arthritis    "qwhere" (07/29/2018)   Atrial fibrillation (HCC)    CHF (congestive heart failure) (HCC)    Clotting disorder (HCC)    Degenerative arthritis of hip    s/p L THR 12/2012   Dyspnea    since surgery in August 2019- "when I get worked up and walk a short distance"   Dysrhythmia    History of blood transfusion 05/2018   "related to OR"   Hyperlipidemia    Hypertension    Intramural hematoma of thoracic aorta ([place]) 06/13/2018   Migraines    mIgraines- none since blood pressure and lipids are under control   Myocardial infarction ([place]) 2000   due to atrial fib   Neuromuscular disorder (HCC)    pinched nerve- left side of neck   Pulmonary emboli ([place]) 12/2012 dx   post op (L THR), anticoag x 18mo  Family History  Problem Relation Age of Onset   Heart disease Father    Diabetes Mother    Heart disease Mother        pacemaker   Heart attack Mother [id number]  Diabetes Sister    Alcohol abuse Other    Heart disease Other    Hyperlipidemia Other    Hypertension Other    Diabetes Other    Breast cancer Other    Colon cancer Neg Hx     Esophageal cancer Neg Hx    Stomach cancer Neg Hx    Rectal cancer Neg Hx    Past Surgical History:  Procedure Laterality Date   ABDOMINAL HYSTERECTOMY     "w/1 tube"   ANTERIOR CERVICAL DECOMPRESSION/DISCECTOMY FUSION 4 LEVELS N/A 02/02/2020   Procedure: CERVICAL THREE-FOUR, CERVICAL FOUR-FIVE, CERVICAL FIVE-SIX, CERVICAL SIX-SEVEN ANTERIOR CERVICAL DECOMPRESSION/DISCECTOMY FUSION;  Surgeon: PEarnie Larsson MD;  [facility] [facility]  Service: Neurosurgery;  Laterality: N/A;   ARTERY REPAIR Left 08/15/2018   Procedure: LEFT BRACHIAL ARTERY EXPLORATION;  Surgeon: CWaynetta Sandy MD;  [place] [facility]  Service: Vascular;  Laterality: Left;   Breast Ultrasound Left 04/16/13   Done @ breast center Impression: no malignancy appearance noted on the screen study is consistent with a summation shadow   CARDIAC CATHETERIZATION     CAROTID-SUBCLAVIAN BYPASS GRAFT Left 06/13/2018   Procedure: BYPASS GRAFT CAROTID-SUBCLAVIAN;  Surgeon: CWaynetta Sandy MD;  [place] [facility]  Service: Vascular;  Laterality: Left;   COLONOSCOPY     CYSTOSCOPY WITH RETROGRADE PYELOGRAM, URETEROSCOPY AND STENT PLACEMENT Bilateral 11/02/2020   Procedure: DIAGNOSTIC CYSTOSCOPY WITH RETROGRADE PYELOGRAM,BILATERAL DIAGNOSTIC URETEROSCOPY  AND BILATERAL STENT PLACEMENT;  Surgeon: Robley Fries, MD;  Location: [facility];  Service: Urology;  Laterality: Bilateral;  90 MINS   DG TUMB RIGHT HAND Right    cyst removal   IR THORACENTESIS ASP PLEURAL SPACE W/IMG GUIDE  05/31/2018   JOINT REPLACEMENT     KNEE ARTHROSCOPY Left 12/16/2020   Procedure: LEFT KNEE ARTHROSCOPY WITH PARTIAL LATERAL MENISCECTOMY;  Surgeon: Mcarthur Rossetti, MD;  Location: [facility];  Service: Orthopedics;  Laterality: Left;   THORACIC AORTIC ENDOVASCULAR STENT GRAFT Left 06/13/2018   Procedure: LEFT  SUBCLAVIAN TO CAROTID ARTERY TRANSPOSITION; THORACIC AORTIC ENDOVASCULAR STENT GRAFT using GORE CONFORMABLE  THORACIC STENT GRAFT AND GORE TAG THORACIC ENDOPROSTHESIS; STENT LEFT COMMON CAROTID ARTERY, RIGHT COMMON-FEMORAL ENDARTERECTOMY WITH PATCH ANGIOPLASTY;  Surgeon: Waynetta Sandy, MD;  Location: [facility] [facility];  Service: Vascular;  Laterality: Left;   TONSILLECTOMY     TOTAL HIP ARTHROPLASTY Left 01/03/2013   Procedure: TOTAL HIP ARTHROPLASTY ANTERIOR APPROACH;  Surgeon: Mcarthur Rossetti, MD;  Location: [facility];  Service: Orthopedics;  Laterality: Left;  Left Total Hip Arthroplasty, Anterior Approach   TUBAL LIGATION     VAGINA RECONSTRUCTION SURGERY  1966   vagina had closed up when 70 years old   WOUND EXPLORATION Left 06/16/2018   Procedure: LEFT NECK WOUND EXPLORATION, CHEST TUBE INSERTION;  Surgeon: Waynetta Sandy, MD;  Location: [facility];  Service: Vascular;  Laterality: Left;    Short Social History:  Social History   Tobacco Use   Smoking status: Never   Smokeless tobacco: Never  Substance Use Topics   Alcohol use: Not Currently    Allergies  Allergen Reactions   Penicillins Swelling and Other (See Comments)    Severe swelling PATIENT HAS HAD A PCN REACTION WITH IMMEDIATE RASH, FACIAL/TONGUE/THROAT SWELLING, SOB, OR LIGHTHEADEDNESS WITH HYPOTENSION:  #  #  YES  #  #  Has patient had a PCN reaction causing severe rash involving mucus membranes or skin necrosis: No PATIENT HAS HAD A PCN REACTION THAT REQUIRED HOSPITALIZATION:  #  #  YES  #  #  Has patient had a PCN reaction occurring within the last 10 years: No   Shellfish Allergy Anaphylaxis   Latex Hives and Rash   Banana Nausea Only   Kiwi Extract    Oxycodone Other (See Comments)    Pt stated, "Made me feel really drowsy" Tolerating as of 02/16/20 after neck surgery   Asa [Aspirin] Nausea And Vomiting    Makes stomach upset Asprin 325   Celebrex [Celecoxib] Nausea And Vomiting    GI upset   Nsaids Rash    Current Outpatient Medications  Medication Sig Dispense Refill   acetaminophen (TYLENOL) 500 MG  tablet Take 1,000 mg by mouth every 8 (eight) hours as needed for moderate pain.     B Complex Vitamins (VITAMIN B COMPLEX) TABS Take 1 tablet by mouth daily.     diltiazem (DILT-XR) 240 MG 24 hr capsule TAKE 1 CAPSULE(240 MG) BY MOUTH EVERY MORNING 90 capsule 0   furosemide (LASIX) 20 MG tablet Take 1 tablet (20 mg total) by mouth daily. Hold until 2/7, start taking 2/7. (Patient taking differently: Take 20 mg by mouth at bedtime.)     Multiple Vitamin (MULTIVITAMIN) tablet Take 2 tablets by mouth daily. Gummies     rosuvastatin (CRESTOR) 20 MG tablet TAKE 1 TABLET BY MOUTH EVERY DAY 90 tablet 0   aspirin EC 81 MG tablet Take 81 mg by mouth at bedtime. (  Patient not taking: No sig reported)     rivaroxaban (XARELTO) 20 MG TABS tablet Take 1 tablet (20 mg total) by mouth daily with supper. (Patient not taking: No sig reported) 90 tablet 3   No current facility-administered medications for this visit.    Review of Systems  Constitutional:  Constitutional negative. HENT: HENT negative.  Eyes: Eyes negative.  Respiratory: Respiratory negative.  Cardiovascular: Cardiovascular negative.  GI: Gastrointestinal negative.  GU: Positive for hematuria.  Skin: Skin negative.  Neurological: Neurological negative. Hematologic: Hematologic/lymphatic negative.  Psychiatric: Psychiatric negative.       Objective:  Objective   Vitals:   05/13/21 1041 05/13/21 1043  BP: 138/82 137/75  Pulse: 61   Resp: 20   Temp: 98 F (36.7 C)   SpO2: 96%   Weight: 168 lb (76.2 kg)   Height: '5\' 4"'$  (1.626 m)    Body mass index is 28.84 kg/m.  Physical Exam HENT:     Head: Normocephalic.     Nose:     Comments: Wearing a mask Eyes:     Pupils: Pupils are equal, round, and reactive to light.  Neck:     Vascular: No carotid bruit.     Comments: Well-healed left supraclavicular incision Cardiovascular:     Rate and Rhythm: Normal rate.     Pulses:          Radial pulses are 2+ on the right side and 2+  on the left side.  Pulmonary:     Effort: Pulmonary effort is normal.  Abdominal:     General: Abdomen is flat.     Palpations: Abdomen is soft.  Musculoskeletal:        General: Normal range of motion.     Cervical back: Normal range of motion and neck supple.  Skin:    General: Skin is warm and dry.     Capillary Refill: Capillary refill takes less than 2 seconds.  Neurological:     General: No focal deficit present.     Mental Status: She is alert.  Psychiatric:        Mood and Affect: Mood normal.        Behavior: Behavior normal.        Thought Content: Thought content normal.        Judgment: Judgment normal.    Data: CTA CHEST   1. Stable TEVAR of aortic isthmus and descending thoracic aorta without evidence of residual dissection or complication. 2. Patent left common carotid artery stent and left carotid subclavian bypass without evidence of complication. 3. Mild cardiomegaly. 4. Extensive coronary artery calcifications.   CTA ABD/PELVIS   1. New right-sided hydronephrosis secondary to a 5 mm obstructing stone in the right mid ureter. 2. Additional nonobstructing nephrolithiasis bilaterally. 3. Perhaps slight interval enlargement of visceral abdominal aortic aneurysm now measuring up to 3.3 cm in maximal diameter. 4. Additional ancillary findings as above without significant interval change.  Right Carotid Findings:  +----------+--------+--------+--------+------------------+--------+            PSV cm/sEDV cm/sStenosisPlaque DescriptionComments  +----------+--------+--------+--------+------------------+--------+  CCA Prox  73      14                                          +----------+--------+--------+--------+------------------+--------+  CCA Mid   63      16                                          +----------+--------+--------+--------+------------------+--------+  CCA Distal62      17                                           +----------+--------+--------+--------+------------------+--------+  ICA Prox  63      21      1-39%   heterogenous                +----------+--------+--------+--------+------------------+--------+  ICA Mid   80      22                                          +----------+--------+--------+--------+------------------+--------+  ICA Distal100     37                                          +----------+--------+--------+--------+------------------+--------+  ECA       112     5                                           +----------+--------+--------+--------+------------------+--------+   +----------+--------+-------+----------------+-------------------+            PSV cm/sEDV cmsDescribe        Arm Pressure (mmHG)  +----------+--------+-------+----------------+-------------------+  [id number]            Multiphasic, WNL                     +----------+--------+-------+----------------+-------------------+   +---------+--------+--+--------+--+---------+  VertebralPSV cm/s81EDV cm/s20Antegrade  +---------+--------+--+--------+--+---------+       Left Carotid Findings:  +----------+--------+--------+--------+------------------+--------+            PSV cm/sEDV cm/sStenosisPlaque DescriptionComments  +----------+--------+--------+--------+------------------+--------+  CCA Prox  259     28                                stent     +----------+--------+--------+--------+------------------+--------+  CCA Mid   96      21                                          +----------+--------+--------+--------+------------------+--------+  CCA Distal52      17              homogeneous                 +----------+--------+--------+--------+------------------+--------+  ICA Prox  77      20      1-39%   homogeneous                 +----------+--------+--------+--------+------------------+--------+  ICA Mid   103      30                                          +----------+--------+--------+--------+------------------+--------+  ICA Distal109     40                                          +----------+--------+--------+--------+------------------+--------+  ECA       80      11                                          +----------+--------+--------+--------+------------------+--------+   +----------+--------+--------+----------------+-------------------+            PSV cm/sEDV cm/sDescribe        Arm Pressure (mmHG)  +----------+--------+--------+----------------+-------------------+  [id number]             Multiphasic, WNL                     +----------+--------+--------+----------------+-------------------+   +---------+--------+--+--------+--+----------------------------------+  VertebralPSV 0000000 cm/s12Systolic deceleration and Atypical  +---------+--------+--+--------+--+----------------------------------+       Left Stent(s):  +---------------+--------+--------+--------+--------+--------+  proximal CCA   PSV cm/sEDV cm/sStenosisWaveformComments  +---------------+--------+--------+--------+--------+--------+  Prox to Stent  165     17                                +---------------+--------+--------+--------+--------+--------+  Proximal Stent 300     26                                +---------------+--------+--------+--------+--------+--------+  Mid Stent      373     37                                +---------------+--------+--------+--------+--------+--------+  Distal Stent   336     40                                +---------------+--------+--------+--------+--------+--------+  Distal to Stent274     42                                +---------------+--------+--------+--------+--------+--------+               Summary:  Right Carotid: Velocities in the right ICA are consistent with a 1-39%   stenosis.   Left Carotid: Velocities in the left ICA are consistent with a 1-39%  stenosis.                   Patent proximal CCA stent with elevated velocities; however,  no                disease observed.                Extrastent flow noted along the anterior wall of the CCA  with                billowing of the vessel.   Vertebrals:  Right vertebral artery demonstrates antegrade flow. Left  atypical.  Subclavians: Normal flow hemodynamics were seen in bilateral subclavian               arteries.      Assessment/Plan:     70 year old female with the above-noted complex thoracic endograft and procedure in the past now follows up with CT and carotid duplex.  We reviewed her CT together.  I cannot appreciate the abnormalities of the stent identified on duplex.  Overall patient has progressed very well.  She will follow-up in 1 year with repeat studies unless she has issues prior.     Waynetta Sandy MD [facility]

## 2021-05-18 NOTE — Telephone Encounter (Signed)
Pt saw Dr Durwin Reges with [facility] and no definitive cause to bleed, but that it did subside when stopping DOAC & ASA.  Dr. Lindi Adie has stated that she should not be on Xarelto. Aware I will follow up with  [place] [facility] [place] to discuss further. Aware I will be in touch and that in may be a wee/two. Patient verbalized understanding and agreeable to plan.

## 2021-05-20 ENCOUNTER — Ambulatory Visit: Payer: Medicare Other | Admitting: Vascular Surgery

## 2021-06-09 NOTE — Progress Notes (Signed)
Patient Care Team: Hoyt Koch, MD as PCP - General (Internal Medicine) [name], MD as PCP - Cardiology (Cardiology) Mcarthur Rossetti, MD (Orthopedic Surgery) Tanda Rockers, MD (Pulmonary Disease) Inda Castle, MD (Inactive) (Gastroenterology) Earnie Larsson, MD as Consulting Physician (Neurosurgery) Charlton Haws, [facility] as Pharmacist (Pharmacist) [name], [place] (Optometry) Loletha Carrow Kirke Corin, MD as Consulting Physician (Gastroenterology) Lorelle Gibbs, MD (Radiology) Paulla Dolly Tamala Fothergill, DPM as Consulting Physician (Podiatry)  DIAGNOSIS:    ICD-10-CM   1. Acute blood loss anemia  D62       CHIEF COMPLIANT: Follow-up of hematuria  INTERVAL HISTORY: Samantha Stevenson is a 70 y.o. with above-mentioned history of hematuria. She was on Xarelto for atrial fibrillation and aspirin. Labs on 05/13/21 showed Hg 8.7, HCT 28.5, platelets 268. She reports to the clinic today for follow-up.  Bleeding has stopped and she is feeling a lot better since Xarelto was discontinued.  ALLERGIES:  is allergic to penicillins, shellfish allergy, latex, banana, kiwi extract, oxycodone, asa [aspirin], celebrex [celecoxib], and nsaids.  MEDICATIONS:  Current Outpatient Medications  Medication Sig Dispense Refill   acetaminophen (TYLENOL) 500 MG tablet Take 1,000 mg by mouth every 8 (eight) hours as needed for moderate pain.     aspirin EC 81 MG tablet Take 81 mg by mouth at bedtime. (Patient not taking: No sig reported)     B Complex Vitamins (VITAMIN B COMPLEX) TABS Take 1 tablet by mouth daily.     diltiazem (DILT-XR) 240 MG 24 hr capsule TAKE 1 CAPSULE(240 MG) BY MOUTH EVERY MORNING 90 capsule 0   furosemide (LASIX) 20 MG tablet Take 1 tablet (20 mg total) by mouth daily. Hold until 2/7, start taking 2/7. (Patient taking differently: Take 20 mg by mouth at bedtime.)     Multiple Vitamin (MULTIVITAMIN) tablet Take 2 tablets by mouth daily. Gummies      rivaroxaban (XARELTO) 20 MG TABS tablet Take 1 tablet (20 mg total) by mouth daily with supper. (Patient not taking: No sig reported) 90 tablet 3   rosuvastatin (CRESTOR) 20 MG tablet TAKE 1 TABLET BY MOUTH EVERY DAY 90 tablet 0   No current facility-administered medications for this visit.    PHYSICAL EXAMINATION: ECOG PERFORMANCE STATUS: 1 - Symptomatic but completely ambulatory  Vitals:   06/10/21 1035  BP: 125/61  Pulse: 74  Resp: 19  Temp: 97.9 F (36.6 C)  SpO2: 100%   Filed Weights   06/10/21 1035  Weight: 169 lb (76.7 kg)    LABORATORY DATA:  I have reviewed the data as listed CMP Latest Ref Rng & Units 05/13/2021 03/10/2021 01/20/2021  Glucose 70 - 99 mg/dL 82 90 92  BUN 8 - 23 mg/dL 19 38(H) 18  Creatinine 0.44 - 1.00 mg/dL 1.13(H) 1.65(H) 1.07  Sodium 135 - 145 mmol/L 143 138 140  Potassium 3.5 - 5.1 mmol/L 4.0 4.0 3.6  Chloride 98 - 111 mmol/L 108 102 103  CO2 22 - 32 mmol/L '28 25 30  '$ Calcium 8.9 - 10.3 mg/dL 9.3 9.2 9.2  Total Protein 6.5 - 8.1 g/dL 7.1 7.4 7.1  Total Bilirubin 0.3 - 1.2 mg/dL 0.4 0.4 0.5  Alkaline Phos 38 - 126 U/L 88 81 92  AST 15 - 41 U/L '19 22 20  '$ ALT 0 - 44 U/L '14 14 12    '$ Lab Results  Component Value Date   WBC 6.8 06/10/2021   HGB 9.6 (L) 06/10/2021   HCT 30.7 (L) 06/10/2021  MCV 86.5 06/10/2021   PLT 298 06/10/2021   NEUTROABS 3.1 06/10/2021    ASSESSMENT & PLAN:  Acute blood loss anemia Recurrent hematuria since January 2022: Status post urological procedures.  It appears that follow-up scopes did not reveal any source of bleeding.  She develops these bleeding symptoms every time she exerts.   Prior treatments: Aspirin and Xarelto (aspirin was discontinued 03/31/2021), takes Xarelto for atrial fibrillation (stopped 04/29/2021)   Longstanding history of anemia her baseline hemoglobin is around 10-11  03/10/2021 showed a hemoglobin of 9.8 04/01/2021: Hemoglobin 9.6, ferritin 23, iron saturation 10%, factor VIII-227% 04/29/2021:  Hemoglobin 9.2 05/13/2021: Hemoglobin 8.7 06/10/2021: Hemoglobin is 9.6  She is taking oral iron therapy and appears to be tolerating it fairly well.  She does have constipation. She has not resumed anticoagulation and therefore the bleeding has stopped.  She is discussing with her cardiologist as to what is next about it. Return to clinic in 3 months with labs and follow-up   No orders of the defined types were placed in this encounter.  The patient has a good understanding of the overall plan. she agrees with it. she will call with any problems that may develop before the next visit here.  Total time spent:.  [place] office can call me and I can do my cell phone number this is regarding chronic [place] CT of abdomen and I want her to have an MRI of  If they are okay we will cancel their CT and order an MRI of the abdomen mins including face to face time and time spent for planning, charting and coordination of care  Rulon Eisenmenger, MD, MPH 06/10/2021  I, Thana Ates, am acting as scribe for Dr. Nicholas Lose.  I have reviewed the above documentation for accuracy and completeness, and I agree with the above.

## 2021-06-10 ENCOUNTER — Inpatient Hospital Stay: Payer: Medicare Other | Attending: Hematology and Oncology

## 2021-06-10 ENCOUNTER — Other Ambulatory Visit: Payer: Self-pay

## 2021-06-10 ENCOUNTER — Inpatient Hospital Stay (HOSPITAL_BASED_OUTPATIENT_CLINIC_OR_DEPARTMENT_OTHER): Payer: Medicare Other | Admitting: Hematology and Oncology

## 2021-06-10 DIAGNOSIS — I4891 Unspecified atrial fibrillation: Secondary | ICD-10-CM | POA: Diagnosis not present

## 2021-06-10 DIAGNOSIS — D62 Acute posthemorrhagic anemia: Secondary | ICD-10-CM | POA: Diagnosis not present

## 2021-06-10 DIAGNOSIS — K5900 Constipation, unspecified: Secondary | ICD-10-CM | POA: Insufficient documentation

## 2021-06-10 DIAGNOSIS — Z7901 Long term (current) use of anticoagulants: Secondary | ICD-10-CM | POA: Diagnosis not present

## 2021-06-10 DIAGNOSIS — Z7982 Long term (current) use of aspirin: Secondary | ICD-10-CM | POA: Insufficient documentation

## 2021-06-10 DIAGNOSIS — Z79899 Other long term (current) drug therapy: Secondary | ICD-10-CM | POA: Diagnosis not present

## 2021-06-10 LAB — CBC WITH DIFFERENTIAL (CANCER CENTER ONLY)
Abs Immature Granulocytes: 0.02 10*3/uL (ref 0.00–0.07)
Basophils Absolute: 0 10*3/uL (ref 0.0–0.1)
Basophils Relative: 0 %
Eosinophils Absolute: 0.2 10*3/uL (ref 0.0–0.5)
Eosinophils Relative: 3 %
HCT: 30.7 % — ABNORMAL LOW (ref 36.0–46.0)
Hemoglobin: 9.6 g/dL — ABNORMAL LOW (ref 12.0–15.0)
Immature Granulocytes: 0 %
Lymphocytes Relative: 42 %
Lymphs Abs: 2.9 10*3/uL (ref 0.7–4.0)
MCH: 27 pg (ref 26.0–34.0)
MCHC: 31.3 g/dL (ref 30.0–36.0)
MCV: 86.5 fL (ref 80.0–100.0)
Monocytes Absolute: 0.6 10*3/uL (ref 0.1–1.0)
Monocytes Relative: 9 %
Neutro Abs: 3.1 10*3/uL (ref 1.7–7.7)
Neutrophils Relative %: 46 %
Platelet Count: 298 10*3/uL (ref 150–400)
RBC: 3.55 MIL/uL — ABNORMAL LOW (ref 3.87–5.11)
RDW: 16.7 % — ABNORMAL HIGH (ref 11.5–15.5)
WBC Count: 6.8 10*3/uL (ref 4.0–10.5)
nRBC: 0 % (ref 0.0–0.2)

## 2021-06-10 LAB — IRON AND TIBC
Iron: 47 ug/dL (ref 41–142)
Saturation Ratios: 12 % — ABNORMAL LOW (ref 21–57)
TIBC: 386 ug/dL (ref 236–444)
UIBC: 339 ug/dL (ref 120–384)

## 2021-06-10 LAB — FERRITIN: Ferritin: 20 ng/mL (ref 11–307)

## 2021-06-10 NOTE — Assessment & Plan Note (Addendum)
Acute blood loss anemia Recurrent hematuria since January 2022: Status post urological procedures. It appears that follow-up scopes did not reveal any source of bleeding. She develops these bleeding symptoms every time she exerts.  Prior treatments: Aspirin and Xarelto (aspirin was discontinued 03/31/2021), takes Xarelto for atrial fibrillation (stopped 04/29/2021)  Longstanding history of anemia her baseline hemoglobin is around 10-11 03/10/2021 showed a hemoglobin of 9.8 04/01/2021: Hemoglobin 9.6, ferritin 23, iron saturation 10%, factor VIII-227% 04/29/2021: Hemoglobin 9.2 05/13/2021: Hemoglobin 8.7 06/10/2021:   If necessary will give her IV iron because she is intolerant of oral iron therapy.

## 2021-06-13 DIAGNOSIS — R809 Proteinuria, unspecified: Secondary | ICD-10-CM | POA: Diagnosis not present

## 2021-06-23 ENCOUNTER — Ambulatory Visit (INDEPENDENT_AMBULATORY_CARE_PROVIDER_SITE_OTHER): Payer: Medicare Other | Admitting: Cardiology

## 2021-06-23 ENCOUNTER — Other Ambulatory Visit: Payer: Self-pay

## 2021-06-23 ENCOUNTER — Encounter: Payer: Self-pay | Admitting: Cardiology

## 2021-06-23 VITALS — BP 130/78 | HR 78 | Ht 64.0 in | Wt 172.2 lb

## 2021-06-23 DIAGNOSIS — I6529 Occlusion and stenosis of unspecified carotid artery: Secondary | ICD-10-CM

## 2021-06-23 DIAGNOSIS — I480 Paroxysmal atrial fibrillation: Secondary | ICD-10-CM

## 2021-06-23 DIAGNOSIS — Z79899 Other long term (current) drug therapy: Secondary | ICD-10-CM

## 2021-06-23 NOTE — Progress Notes (Signed)
Electrophysiology Office Note   Date:  06/23/2021   ID:  Samantha Stevenson, DOB [birth year], MRN [id number]  PCP:  Hoyt Koch, MD  Primary Electrophysiologist:  [name], MD    No chief complaint on file.    History of Present Illness: Samantha Stevenson is a 70 y.o. female who presents today for electrophysiology evaluation.     She has a history significant for atrial fibrillation, hypertension, hyperlipidemia.  She has had palpitation since age of 47.  She was diagnosed with atrial fibrillation in the 1980s.  She was diagnosed with an acute thoracic aorta intramural hematoma extending from the subclavian artery to the renal arteries.  She is currently followed by vascular surgery.  She had been having issues with hematuria.  She was initially on both Xarelto and aspirin, but these have since been stopped.  All hematuria has stopped.  She again try to take her Xarelto, but hematuria started again.  She is gone undergone urologic evaluation without any cause for bleeding.  She is currently not anticoagulated.  Today, denies symptoms of palpitations, chest pain, shortness of breath, orthopnea, PND, lower extremity edema, claudication, dizziness, presyncope, syncope, bleeding, or neurologic sequela. The patient is tolerating medications without difficulties.  Today she feels well.  She has no chest pain or shortness of breath.  She is able to do all of her daily activities without restriction.  She has restarted 81 mg of aspirin.  She has not had any further bleeding on her aspirin.  She has been on this medication for approximately 1 week.  Past Medical History:  Diagnosis Date   Allergy    Anemia    Arthritis    "qwhere" (07/29/2018)   Atrial fibrillation (HCC)    CHF (congestive heart failure) (HCC)    Clotting disorder (HCC)    Degenerative arthritis of hip    s/p L THR 12/2012   Dyspnea    since surgery in August 2019- "when I get worked up and walk a short  distance"   Dysrhythmia    History of blood transfusion 05/2018   "related to OR"   Hyperlipidemia    Hypertension    Intramural hematoma of thoracic aorta ([place]) 06/13/2018   Migraines    mIgraines- none since blood pressure and lipids are under control   Myocardial infarction ([place]) 2000   due to atrial fib   Neuromuscular disorder (HCC)    pinched nerve- left side of neck   Pulmonary emboli ([place]) 12/2012 dx   post op (L THR), anticoag x 44mo  Past Surgical History:  Procedure Laterality Date   ABDOMINAL HYSTERECTOMY     "w/1 tube"   ANTERIOR CERVICAL DECOMPRESSION/DISCECTOMY FUSION 4 LEVELS N/A 02/02/2020   Procedure: CERVICAL THREE-FOUR, CERVICAL FOUR-FIVE, CERVICAL FIVE-SIX, CERVICAL SIX-SEVEN ANTERIOR CERVICAL DECOMPRESSION/DISCECTOMY FUSION;  Surgeon: PEarnie Larsson MD;  [facility]  Service: Neurosurgery;  Laterality: N/A;   ARTERY REPAIR Left 08/15/2018   Procedure: LEFT BRACHIAL ARTERY EXPLORATION;  Surgeon: CWaynetta Sandy MD;  [facility]  Service: Vascular;  Laterality: Left;   Breast Ultrasound Left 04/16/13   Done @ breast center Impression: no malignancy appearance noted on the screen study is consistent with a summation shadow   CARDIAC CATHETERIZATION     CAROTID-SUBCLAVIAN BYPASS GRAFT Left 06/13/2018   Procedure: BYPASS GRAFT CAROTID-SUBCLAVIAN;  Surgeon: CWaynetta Sandy MD;  [facility]  Service: Vascular;  Laterality: Left;   COLONOSCOPY     CYSTOSCOPY WITH RETROGRADE PYELOGRAM,  URETEROSCOPY AND STENT PLACEMENT Bilateral 11/02/2020   Procedure: DIAGNOSTIC CYSTOSCOPY WITH RETROGRADE PYELOGRAM,BILATERAL DIAGNOSTIC URETEROSCOPY  AND BILATERAL STENT PLACEMENT;  Surgeon: Robley Fries, MD;  Location: [facility];  Service: Urology;  Laterality: Bilateral;  90 MINS   DG TUMB RIGHT HAND Right    cyst removal   IR THORACENTESIS ASP PLEURAL SPACE W/IMG GUIDE  05/31/2018   JOINT REPLACEMENT     KNEE ARTHROSCOPY Left  12/16/2020   Procedure: LEFT KNEE ARTHROSCOPY WITH PARTIAL LATERAL MENISCECTOMY;  Surgeon: Mcarthur Rossetti, MD;  Location: [place];  Service: Orthopedics;  Laterality: Left;   THORACIC AORTIC ENDOVASCULAR STENT GRAFT Left 06/13/2018   Procedure: LEFT  SUBCLAVIAN TO CAROTID ARTERY TRANSPOSITION; THORACIC AORTIC ENDOVASCULAR STENT GRAFT using GORE CONFORMABLE THORACIC STENT GRAFT AND GORE TAG THORACIC ENDOPROSTHESIS; STENT LEFT COMMON CAROTID ARTERY, RIGHT COMMON-FEMORAL ENDARTERECTOMY WITH PATCH ANGIOPLASTY;  Surgeon: Waynetta Sandy, MD;  Location: [place];  Service: Vascular;  Laterality: Left;   TONSILLECTOMY     TOTAL HIP ARTHROPLASTY Left 01/03/2013   Procedure: TOTAL HIP ARTHROPLASTY ANTERIOR APPROACH;  Surgeon: Mcarthur Rossetti, MD;  Location: [facility];  Service: Orthopedics;  Laterality: Left;  Left Total Hip Arthroplasty, Anterior Approach   TUBAL LIGATION     VAGINA RECONSTRUCTION SURGERY  1966   vagina had closed up when 70 years old   WOUND EXPLORATION Left 06/16/2018   Procedure: LEFT NECK WOUND EXPLORATION, CHEST TUBE INSERTION;  Surgeon: Waynetta Sandy, MD;  Location: [place];  Service: Vascular;  Laterality: Left;     Current Outpatient Medications  Medication Sig Dispense Refill   acetaminophen (TYLENOL) 500 MG tablet Take 1,000 mg by mouth every 8 (eight) hours as needed for moderate pain.     aspirin EC 81 MG tablet Take 81 mg by mouth at bedtime.     B Complex Vitamins (VITAMIN B COMPLEX) TABS Take 1 tablet by mouth daily.     diltiazem (DILT-XR) 240 MG 24 hr capsule TAKE 1 CAPSULE(240 MG) BY MOUTH EVERY MORNING 90 capsule 0   furosemide (LASIX) 20 MG tablet Take 1 tablet (20 mg total) by mouth daily. Hold until 2/7, start taking 2/7. (Patient taking differently: Take 20 mg by mouth at bedtime.)     Multiple Vitamin (MULTIVITAMIN) tablet Take 2 tablets by mouth daily. Gummies     rivaroxaban (XARELTO) 20 MG TABS tablet Take 1 tablet  (20 mg total) by mouth daily with supper. 90 tablet 3   rosuvastatin (CRESTOR) 20 MG tablet TAKE 1 TABLET BY MOUTH EVERY DAY 90 tablet 0   No current facility-administered medications for this visit.    Allergies:   Penicillins, Shellfish allergy, Latex, Banana, Kiwi extract, Oxycodone, Asa [aspirin], Celebrex [celecoxib], and Nsaids   Social History:  The patient  reports that she has never smoked. She has never used smokeless tobacco. She reports that she does not currently use alcohol. She reports that she does not use drugs.   Family History:  The patient's family history includes Alcohol abuse in an other family member; Breast cancer in an other family member; Diabetes in her mother, sister, and another family member; Heart attack (age of onset: 71) in her mother; Heart disease in her father, mother, and another family member; Hyperlipidemia in an other family member; Hypertension in an other family member.   ROS:  Please see the history of present illness.   Otherwise, review of systems is positive for none.   All other systems are  reviewed and negative.   PHYSICAL EXAM: VS:  BP 130/78   Pulse 78   Ht '5\' 4"'$  (1.626 m)   Wt 172 lb 3.2 oz (78.1 kg)   SpO2 98%   BMI 29.56 kg/m  , BMI Body mass index is 29.56 kg/m. GEN: Well nourished, well developed, in no acute distress  HEENT: normal  Neck: no JVD, carotid bruits, or masses Cardiac: RRR; no murmurs, rubs, or gallops,no edema  Respiratory:  clear to auscultation bilaterally, normal work of breathing GI: soft, nontender, nondistended, + BS MS: no deformity or atrophy  Skin: warm and dry Neuro:  Strength and sensation are intact Psych: euthymic mood, full affect  EKG:  EKG is ordered today. Personal review of the ekg ordered shows sinus rhythm, rate 78  Recent Labs: 11/23/2020: Magnesium 2.1 12/21/2020: Pro B Natriuretic peptide (BNP) 17.0; TSH 1.43 05/13/2021: ALT 14; BUN 19; Creatinine 1.13; Potassium 4.0; Sodium 143 06/10/2021:  Hemoglobin 9.6; Platelet Count 298    Lipid Panel     Component Value Date/Time   CHOL 189 01/03/2018 1044   TRIG 128.0 01/03/2018 1044   HDL 46.70 01/03/2018 1044   CHOLHDL 4 01/03/2018 1044   VLDL 25.6 01/03/2018 1044   LDLCALC 117 (H) 01/03/2018 1044     Wt Readings from Last 3 Encounters:  06/23/21 172 lb 3.2 oz (78.1 kg)  06/10/21 169 lb (76.7 kg)  05/13/21 169 lb 4.8 oz (76.8 kg)      Other studies Reviewed: Additional studies/ records that were reviewed today include: TTE 2020  1. Left ventricular ejection fraction, by visual estimation, is 60 to  65%. The left ventricle has normal function. There is no left ventricular  hypertrophy.   2. Elevated left ventricular end-diastolic pressure.   3. Left ventricular diastolic parameters are consistent with Grade I  diastolic dysfunction (impaired relaxation).   4. The left ventricle has no regional wall motion abnormalities.   5. Global right ventricle has normal systolic function.The right  ventricular size is normal. No increase in right ventricular wall  thickness.   6. Left atrial size was mildly dilated.   7. Right atrial size was normal.   8. The mitral valve is normal in structure. Trivial mitral valve  regurgitation. No evidence of mitral stenosis.   9. The tricuspid valve is normal in structure.  10. The aortic valve is normal in structure. Aortic valve regurgitation is  not visualized. No evidence of aortic valve sclerosis or stenosis.  11. The pulmonic valve was normal in structure. Pulmonic valve  regurgitation is not visualized.  12. Normal pulmonary artery systolic pressure.  13. The tricuspid regurgitant velocity is 2.12 m/s, and with an assumed  right atrial pressure of 3 mmHg, the estimated right ventricular systolic  pressure is normal at 21.0 mmHg.  14. The inferior vena cava is normal in size with greater than 50%  respiratory variability, suggesting right atrial pressure of 3 mmHg.   ASSESSMENT  AND PLAN:  1.  Paroxysmal atrial fibrillation: CHA2DS2-VASc of 2.  Currently on diltiazem.  She has been taken off of Xarelto due to urinary bleeding.  At this point, we Amil Bouwman continue to hold off on anticoagulation.  She has not had any further episodes of atrial fibrillation.  2.  Hypertension: Currently well controlled  3.  Hyperlipidemia: Continue Crestor per primary physician.  Current medicines are reviewed at length with the patient today.   The patient does not have concerns regarding her medicines.  The following changes  were made today: None  Labs/ tests ordered today include:  Orders Placed This Encounter  Procedures   EKG 12-Lead      Disposition:   FU with Adraine Biffle 12 months  Signed, Reyaan Thoma Meredith Leeds, MD  06/23/2021 2:56 PM     [address] [place] [address] [phone] (office) [phone] (fax)

## 2021-06-23 NOTE — Patient Instructions (Signed)
Medication Instructions:  Your physician recommends that you continue on your current medications as directed. Please refer to the Current Medication list given to you today.  *If you need a refill on your cardiac medications before your next appointment, please call your pharmacy*   Lab Work: None Ordered If you have labs (blood work) drawn today and your tests are completely normal, you will receive your results only by: [place] (if you have MyChart) OR A paper copy in the mail If you have any lab test that is abnormal or we need to change your treatment, we will call you to review the results.   Testing/Procedures: None Ordered   Follow-Up: At [facility], you and your health needs are our priority.  As part of our continuing mission to provide you with exceptional heart care, we have created designated Provider Care Teams.  These Care Teams include your primary Cardiologist (physician) and Advanced Practice Providers (APPs -  Physician Assistants and Nurse Practitioners) who all work together to provide you with the care you need, when you need it.  Your next appointment:   1 year(s)  The format for your next appointment:   In Person  Provider:   You may see Allegra Lai, MD or one of the following Advanced Practice Providers on your designated Care Team:   Tommye Standard, [place] "[facility]" [place], [place]

## 2021-06-24 DIAGNOSIS — I10 Essential (primary) hypertension: Secondary | ICD-10-CM | POA: Diagnosis not present

## 2021-06-24 DIAGNOSIS — R82998 Other abnormal findings in urine: Secondary | ICD-10-CM | POA: Diagnosis not present

## 2021-06-24 DIAGNOSIS — N200 Calculus of kidney: Secondary | ICD-10-CM | POA: Diagnosis not present

## 2021-06-24 DIAGNOSIS — R3129 Other microscopic hematuria: Secondary | ICD-10-CM | POA: Diagnosis not present

## 2021-06-24 DIAGNOSIS — I5032 Chronic diastolic (congestive) heart failure: Secondary | ICD-10-CM | POA: Diagnosis not present

## 2021-06-24 DIAGNOSIS — R809 Proteinuria, unspecified: Secondary | ICD-10-CM | POA: Diagnosis not present

## 2021-07-05 ENCOUNTER — Other Ambulatory Visit: Payer: Self-pay | Admitting: Nephrology

## 2021-07-05 DIAGNOSIS — N1330 Unspecified hydronephrosis: Secondary | ICD-10-CM

## 2021-07-08 ENCOUNTER — Ambulatory Visit
Admission: RE | Admit: 2021-07-08 | Discharge: 2021-07-08 | Disposition: A | Discharge status: Home / Self Care | Payer: Medicare Other | Source: Ambulatory Visit | Attending: Nephrology | Admitting: Nephrology

## 2021-07-08 DIAGNOSIS — N1330 Unspecified hydronephrosis: Secondary | ICD-10-CM

## 2021-07-08 DIAGNOSIS — N132 Hydronephrosis with renal and ureteral calculous obstruction: Secondary | ICD-10-CM | POA: Diagnosis not present

## 2021-07-11 ENCOUNTER — Other Ambulatory Visit: Payer: Self-pay | Admitting: Nephrology

## 2021-07-11 DIAGNOSIS — N1330 Unspecified hydronephrosis: Secondary | ICD-10-CM

## 2021-07-21 ENCOUNTER — Ambulatory Visit (INDEPENDENT_AMBULATORY_CARE_PROVIDER_SITE_OTHER): Payer: Medicare Other

## 2021-07-21 ENCOUNTER — Other Ambulatory Visit: Payer: Self-pay

## 2021-07-21 ENCOUNTER — Encounter: Payer: Self-pay | Admitting: Internal Medicine

## 2021-07-21 VITALS — BP 122/82 | HR 73 | Temp 98.2°F | Ht 64.0 in | Wt 170.8 lb

## 2021-07-21 DIAGNOSIS — Z23 Encounter for immunization: Secondary | ICD-10-CM | POA: Diagnosis not present

## 2021-07-21 DIAGNOSIS — Z0000 Encounter for general adult medical examination without abnormal findings: Secondary | ICD-10-CM | POA: Diagnosis not present

## 2021-07-21 NOTE — Patient Instructions (Signed)
Samantha Stevenson , Thank you for taking time to come for your Medicare Wellness Visit. I appreciate your ongoing commitment to your health goals. Please review the following plan we discussed and let me know if I can assist you in the future.   Screening recommendations/referrals: Colonoscopy: 09/16/2019; due every 7 years Mammogram: 08/24/2020; due every year Bone Density: 03/28/2017; due every 5 years Recommended yearly ophthalmology/optometry visit for glaucoma screening and checkup Recommended yearly dental visit for hygiene and checkup  Vaccinations: Influenza vaccine: 07/21/2021 Pneumococcal vaccine: 01/03/2018, 04/08/2019 Tdap vaccine: 10/24/2011; due every 10 years Shingles vaccine: never done   Covid-19: 06/12/2020, 07/03/2020  Advanced directives: Advance directive discussed with you today. I have provided a copy for you to complete at home and have notarized. Once this is complete please bring a copy in to our office so we can scan it into your chart.   Conditions/risks identified: Yes; Client understands the importance of follow-up with providers by attending scheduled visits and discussed goals to eat healthier, increase physical activity, exercise the brain, socialize more, get enough sleep and make time for laughter.  Next appointment: Please schedule your next Medicare Wellness Visit with your Nurse Health Advisor in 1 year by calling [phone].   Preventive Care 68 Years and Older, Female Preventive care refers to lifestyle choices and visits with your health care provider that can promote health and wellness. What does preventive care include? A yearly physical exam. This is also called an annual well check. Dental exams once or twice a year. Routine eye exams. Ask your health care provider how often you should have your eyes checked. Personal lifestyle choices, including: Daily care of your teeth and gums. Regular physical activity. Eating a healthy diet. Avoiding tobacco  and drug use. Limiting alcohol use. Practicing safe sex. Taking low-dose aspirin every day. Taking vitamin and mineral supplements as recommended by your health care provider. What happens during an annual well check? The services and screenings done by your health care provider during your annual well check will depend on your age, overall health, lifestyle risk factors, and family history of disease. Counseling  Your health care provider may ask you questions about your: Alcohol use. Tobacco use. Drug use. Emotional well-being. Home and relationship well-being. Sexual activity. Eating habits. History of falls. Memory and ability to understand (cognition). Work and work Statistician. Reproductive health. Screening  You may have the following tests or measurements: Height, weight, and BMI. Blood pressure. Lipid and cholesterol levels. These may be checked every 5 years, or more frequently if you are over 65 years old. Skin check. Lung cancer screening. You may have this screening every year starting at age 32 if you have a 30-pack-year history of smoking and currently smoke or have quit within the past 15 years. Fecal occult blood test (FOBT) of the stool. You may have this test every year starting at age 15. Flexible sigmoidoscopy or colonoscopy. You may have a sigmoidoscopy every 5 years or a colonoscopy every 10 years starting at age 57. Hepatitis C blood test. Hepatitis B blood test. Sexually transmitted disease (STD) testing. Diabetes screening. This is done by checking your blood sugar (glucose) after you have not eaten for a while (fasting). You may have this done every 1-3 years. Bone density scan. This is done to screen for osteoporosis. You may have this done starting at age 81. Mammogram. This may be done every 1-2 years. Talk to your health care provider about how often you should have regular mammograms.  Talk with your health care provider about your test results,  treatment options, and if necessary, the need for more tests. Vaccines  Your health care provider may recommend certain vaccines, such as: Influenza vaccine. This is recommended every year. Tetanus, diphtheria, and acellular pertussis (Tdap, Td) vaccine. You may need a Td booster every 10 years. Zoster vaccine. You may need this after age 79. Pneumococcal 13-valent conjugate (PCV13) vaccine. One dose is recommended after age 92. Pneumococcal polysaccharide (PPSV23) vaccine. One dose is recommended after age 26. Talk to your health care provider about which screenings and vaccines you need and how often you need them. This information is not intended to replace advice given to you by your health care provider. Make sure you discuss any questions you have with your health care provider. Document Released: 11/05/2015 Document Revised: 06/28/2016 Document Reviewed: 08/10/2015 Elsevier Interactive Patient Education  2017 [place] Prevention in the Home Falls can cause injuries. They can happen to people of all ages. There are many things you can do to make your home safe and to help prevent falls. What can I do on the outside of my home? Regularly fix the edges of walkways and driveways and fix any cracks. Remove anything that might make you trip as you walk through a door, such as a raised step or threshold. Trim any bushes or trees on the path to your home. Use bright outdoor lighting. Clear any walking paths of anything that might make someone trip, such as rocks or tools. Regularly check to see if handrails are loose or broken. Make sure that both sides of any steps have handrails. Any raised decks and porches should have guardrails on the edges. Have any leaves, snow, or ice cleared regularly. Use sand or salt on walking paths during winter. Clean up any spills in your garage right away. This includes oil or grease spills. What can I do in the bathroom? Use night  lights. Install grab bars by the toilet and in the tub and shower. Do not use towel bars as grab bars. Use non-skid mats or decals in the tub or shower. If you need to sit down in the shower, use a plastic, non-slip stool. Keep the floor dry. Clean up any water that spills on the floor as soon as it happens. Remove soap buildup in the tub or shower regularly. Attach bath mats securely with double-sided non-slip rug tape. Do not have throw rugs and other things on the floor that can make you trip. What can I do in the bedroom? Use night lights. Make sure that you have a light by your bed that is easy to reach. Do not use any sheets or blankets that are too big for your bed. They should not hang down onto the floor. Have a firm chair that has side arms. You can use this for support while you get dressed. Do not have throw rugs and other things on the floor that can make you trip. What can I do in the kitchen? Clean up any spills right away. Avoid walking on wet floors. Keep items that you use a lot in easy-to-reach places. If you need to reach something above you, use a strong step stool that has a grab bar. Keep electrical cords out of the way. Do not use floor polish or wax that makes floors slippery. If you must use wax, use non-skid floor wax. Do not have throw rugs and other things on the floor that can make  you trip. What can I do with my stairs? Do not leave any items on the stairs. Make sure that there are handrails on both sides of the stairs and use them. Fix handrails that are broken or loose. Make sure that handrails are as long as the stairways. Check any carpeting to make sure that it is firmly attached to the stairs. Fix any carpet that is loose or worn. Avoid having throw rugs at the top or bottom of the stairs. If you do have throw rugs, attach them to the floor with carpet tape. Make sure that you have a light switch at the top of the stairs and the bottom of the stairs. If  you do not have them, ask someone to add them for you. What else can I do to help prevent falls? Wear shoes that: Do not have high heels. Have rubber bottoms. Are comfortable and fit you well. Are closed at the toe. Do not wear sandals. If you use a stepladder: Make sure that it is fully opened. Do not climb a closed stepladder. Make sure that both sides of the stepladder are locked into place. Ask someone to hold it for you, if possible. Clearly mark and make sure that you can see: Any grab bars or handrails. First and last steps. Where the edge of each step is. Use tools that help you move around (mobility aids) if they are needed. These include: Canes. Walkers. Scooters. Crutches. Turn on the lights when you go into a dark area. Replace any light bulbs as soon as they burn out. Set up your furniture so you have a clear path. Avoid moving your furniture around. If any of your floors are uneven, fix them. If there are any pets around you, be aware of where they are. Review your medicines with your doctor. Some medicines can make you feel dizzy. This can increase your chance of falling. Ask your doctor what other things that you can do to help prevent falls. This information is not intended to replace advice given to you by your health care provider. Make sure you discuss any questions you have with your health care provider. Document Released: 08/05/2009 Document Revised: 03/16/2016 Document Reviewed: 11/13/2014 Elsevier Interactive Patient Education  2017 [employer].

## 2021-07-21 NOTE — Progress Notes (Addendum)
Subjective:   Samantha Stevenson is a 70 y.o. female who presents for Medicare Annual (Subsequent) preventive examination.  Review of Systems     Cardiac Risk Factors include: advanced age (>59men, >12 women);dyslipidemia;hypertension;family history of premature cardiovascular disease     Objective:    Today's Vitals   07/21/21 1008  BP: 122/82  Pulse: 73  Temp: 98.2 F (36.8 C)  SpO2: 98%  Weight: 170 lb 12.8 oz (77.5 kg)  Height: 5\' 4"  (1.626 m)  PainSc: 0-No pain   Body mass index is 29.32 kg/m.  Advanced Directives 07/21/2021 03/10/2021 12/16/2020 12/08/2020 11/23/2020 11/02/2020 10/26/2020  Does Patient Have a Medical Advance Directive? Yes No No No No No No  Does patient want to make changes to medical advance directive? No - Patient declined - - - - - -  Would patient like information on creating a medical advance directive? - No - Patient declined No - Patient declined No - Patient declined No - Patient declined No - Patient declined -  Pre-existing out of facility DNR order (yellow form or pink MOST form) - - - - - - -    Current Medications (verified) Outpatient Encounter Medications as of 07/21/2021  Medication Sig   acetaminophen (TYLENOL) 500 MG tablet Take 1,000 mg by mouth every 8 (eight) hours as needed for moderate pain.   aspirin EC 81 MG tablet Take 81 mg by mouth at bedtime.   B Complex Vitamins (VITAMIN B COMPLEX) TABS Take 1 tablet by mouth daily.   diltiazem (DILT-XR) 240 MG 24 hr capsule TAKE 1 CAPSULE(240 MG) BY MOUTH EVERY MORNING   ferrous sulfate 325 (65 FE) MG tablet Take 325 mg by mouth daily with breakfast.   furosemide (LASIX) 20 MG tablet Take 1 tablet (20 mg total) by mouth daily. Hold until 2/7, start taking 2/7. (Patient taking differently: Take 20 mg by mouth at bedtime.)   Multiple Vitamin (MULTIVITAMIN) tablet Take 2 tablets by mouth daily. Gummies   rosuvastatin (CRESTOR) 20 MG tablet TAKE 1 TABLET BY MOUTH EVERY DAY   rivaroxaban (XARELTO)  20 MG TABS tablet Take 1 tablet (20 mg total) by mouth daily with supper. (Patient not taking: Reported on 07/21/2021)   No facility-administered encounter medications on file as of 07/21/2021.    Allergies (verified) Penicillins, Shellfish allergy, Latex, Banana, Kiwi extract, Lactose intolerance (gi), Oxycodone, Asa [aspirin], Celebrex [celecoxib], and Nsaids   History: Past Medical History:  Diagnosis Date   Allergy    Anemia    Arthritis    "qwhere" (07/29/2018)   Atrial fibrillation (HCC)    CHF (congestive heart failure) (HCC)    Clotting disorder (HCC)    Degenerative arthritis of hip    s/p L THR 12/2012   Dyspnea    since surgery in August 2019- "when I get worked up and walk a short distance"   Dysrhythmia    History of blood transfusion 05/2018   "related to OR"   Hyperlipidemia    Hypertension    Intramural hematoma of thoracic aorta ([place]) 06/13/2018   Migraines    mIgraines- none since blood pressure and lipids are under control   Myocardial infarction ([place]) 2000   due to atrial fib   Neuromuscular disorder (HCC)    pinched nerve- left side of neck   Pulmonary emboli ([place]) 12/2012 dx   post op (L THR), anticoag x 51mo   Past Surgical History:  Procedure Laterality Date   ABDOMINAL HYSTERECTOMY     "w/1  tube"   ANTERIOR CERVICAL DECOMPRESSION/DISCECTOMY FUSION 4 LEVELS N/A 02/02/2020   Procedure: CERVICAL THREE-FOUR, CERVICAL FOUR-FIVE, CERVICAL FIVE-SIX, CERVICAL SIX-SEVEN ANTERIOR CERVICAL DECOMPRESSION/DISCECTOMY FUSION;  Surgeon: Earnie Larsson, MD;  Location: [facility];  Service: Neurosurgery;  Laterality: N/A;   ARTERY REPAIR Left 08/15/2018   Procedure: LEFT BRACHIAL ARTERY EXPLORATION;  Surgeon: Waynetta Sandy, MD;  Location: [facility];  Service: Vascular;  Laterality: Left;   Breast Ultrasound Left 04/16/13   Done @ breast center Impression: no malignancy appearance noted on the screen study is consistent with a summation shadow   CARDIAC CATHETERIZATION      CAROTID-SUBCLAVIAN BYPASS GRAFT Left 06/13/2018   Procedure: BYPASS GRAFT CAROTID-SUBCLAVIAN;  Surgeon: Waynetta Sandy, MD;  Location: [facility];  Service: Vascular;  Laterality: Left;   COLONOSCOPY     CYSTOSCOPY WITH RETROGRADE PYELOGRAM, URETEROSCOPY AND STENT PLACEMENT Bilateral 11/02/2020   Procedure: DIAGNOSTIC CYSTOSCOPY WITH RETROGRADE PYELOGRAM,BILATERAL DIAGNOSTIC URETEROSCOPY  AND BILATERAL STENT PLACEMENT;  Surgeon: Robley Fries, MD;  Location: [facility];  Service: Urology;  Laterality: Bilateral;  90 MINS   DG TUMB RIGHT HAND Right    cyst removal   IR THORACENTESIS ASP PLEURAL SPACE W/IMG GUIDE  05/31/2018   JOINT REPLACEMENT     KNEE ARTHROSCOPY Left 12/16/2020   Procedure: LEFT KNEE ARTHROSCOPY WITH PARTIAL LATERAL MENISCECTOMY;  Surgeon: Mcarthur Rossetti, MD;  Location: [facility];  Service: Orthopedics;  Laterality: Left;   THORACIC AORTIC ENDOVASCULAR STENT GRAFT Left 06/13/2018   Procedure: LEFT  SUBCLAVIAN TO CAROTID ARTERY TRANSPOSITION; THORACIC AORTIC ENDOVASCULAR STENT GRAFT using GORE CONFORMABLE THORACIC STENT GRAFT AND GORE TAG THORACIC ENDOPROSTHESIS; STENT LEFT COMMON CAROTID ARTERY, RIGHT COMMON-FEMORAL ENDARTERECTOMY WITH PATCH ANGIOPLASTY;  Surgeon: Waynetta Sandy, MD;  Location: [facility];  Service: Vascular;  Laterality: Left;   TONSILLECTOMY     TOTAL HIP ARTHROPLASTY Left 01/03/2013   Procedure: TOTAL HIP ARTHROPLASTY ANTERIOR APPROACH;  Surgeon: Mcarthur Rossetti, MD;  Location: [facility];  Service: Orthopedics;  Laterality: Left;  Left Total Hip Arthroplasty, Anterior Approach   TUBAL LIGATION     VAGINA RECONSTRUCTION SURGERY  1966   vagina had closed up when 70 years old   WOUND EXPLORATION Left 06/16/2018   Procedure: LEFT NECK WOUND EXPLORATION, CHEST TUBE INSERTION;  Surgeon: Waynetta Sandy, MD;  Location: [facility];  Service: Vascular;  Laterality: Left;   Family History  Problem Relation  Age of Onset   Heart disease Father    Diabetes Mother    Heart disease Mother        pacemaker   Heart attack Mother 41   Diabetes Sister    Alcohol abuse Other    Heart disease Other    Hyperlipidemia Other    Hypertension Other    Diabetes Other    Breast cancer Other    Colon cancer Neg Hx    Esophageal cancer Neg Hx    Stomach cancer Neg Hx    Rectal cancer Neg Hx    Social History   Socioeconomic History   Marital status: Single    Spouse name: Not on file   Number of children: 2   Years of education: Not on file   Highest education level: Not on file  Occupational History   Occupation: retired  Tobacco Use   Smoking status: Never   Smokeless tobacco: Never  Vaping Use   Vaping Use: Never used  Substance and Sexual Activity   Alcohol use: Not Currently  Drug use: Never   Sexual activity: Not Currently  Other Topics Concern   Not on file  Social History Narrative   Not on file   Social Determinants of Health   Financial Resource Strain: Low Risk    Difficulty of Paying Living Expenses: Not hard at all  Food Insecurity: No Food Insecurity   Worried About Running Out of Food in the Last Year: Never true   [place] in the Last Year: Never true  Transportation Needs: No Transportation Needs   Lack of Transportation (Medical): No   Lack of Transportation (Non-Medical): No  Physical Activity: Sufficiently Active   Days of Exercise per Week: 5 days   Minutes of Exercise per Session: 30 min  Stress: No Stress Concern Present   Feeling of Stress : Not at all  Social Connections: Moderately Integrated   Frequency of Communication with Friends and Family: More than three times a week   Frequency of Social Gatherings with Friends and Family: More than three times a week   Attends Religious Services: More than 4 times per year   Active Member of [employer] or Organizations: Yes   Attends Music therapist: More than 4 times per year   Marital  Status: Never married    Tobacco Counseling Counseling given: Not Answered   Clinical Intake:  Pre-visit preparation completed: Yes  Pain : No/denies pain Pain Score: 0-No pain     BMI - recorded: 29.32 Nutritional Status: BMI 25 -29 Overweight Nutritional Risks: None Diabetes: No  How often do you need to have someone help you when you read instructions, pamphlets, or other written materials from your doctor or pharmacy?: 1 - Never What is the last grade level you completed in school?: Associates Degree  Diabetic? no  Interpreter Needed?: No  Information entered by :: Lisette Abu, LPN   Activities of Daily Living In your present state of health, do you have any difficulty performing the following activities: 07/21/2021 12/16/2020  Hearing? N N  Vision? N N  Difficulty concentrating or making decisions? N N  Walking or climbing stairs? N N  Comment - goes slow  Dressing or bathing? N N  Doing errands, shopping? N -  Preparing Food and eating ? N -  Using the Toilet? N -  In the past six months, have you accidently leaked urine? N -  Do you have problems with loss of bowel control? N -  Managing your Medications? N -  Managing your Finances? N -  Housekeeping or managing your Housekeeping? N -  Some recent data might be hidden    Patient Care Team: Hoyt Koch, MD as PCP - General (Internal Medicine) [name], MD as PCP - Cardiology (Cardiology) Mcarthur Rossetti, MD (Orthopedic Surgery) Tanda Rockers, MD (Pulmonary Disease) Inda Castle, MD (Inactive) (Gastroenterology) Earnie Larsson, MD as Consulting Physician (Neurosurgery) Charlton Haws, [facility] as Pharmacist (Pharmacist) [name], [place] (Optometry) Loletha Carrow Kirke Corin, MD as Consulting Physician (Gastroenterology) Lorelle Gibbs, MD (Radiology) Paulla Dolly Tamala Fothergill, DPM as Consulting Physician (Podiatry)  Indicate any recent Medical Services you may have  received from other than [facility] providers in the past year (date may be approximate).     Assessment:   This is a routine wellness examination for [place].  Hearing/Vision screen Hearing Screening - Comments:: Patient denied any hearing difficulty.   No hearing aids.  Vision Screening - Comments:: Patient wears corrective glasses/contacts.  Eye exam done  annually by: [name], OD.  Dietary issues and exercise activities discussed: Current Exercise Habits: Home exercise routine;Structured exercise class, Type of exercise: walking;treadmill;strength training/weights;stretching, Time (Minutes): 30, Frequency (Times/Week): 5, Weekly Exercise (Minutes/Week): 150, Intensity: Moderate, Exercise limited by: cardiac condition(s)   Goals Addressed             This Visit's Progress    Patient Stated       My goal is to lose 20-25 pounds by watching my diets, staying hydrated and physically active.      Depression Screen PHQ 2/9 Scores 07/21/2021 04/12/2020 04/08/2019 03/28/2018 03/21/2017  PHQ - 2 Score 0 0 0 1 0  PHQ- 9 Score - - - 4 -    Fall Risk Fall Risk  07/21/2021 04/12/2020 04/08/2019 03/28/2018 03/21/2017  Falls in the past year? 0 0 0 No No  Number falls in past yr: 0 0 0 - -  Injury with Fall? 0 0 - - -  Risk for fall due to : No Fall Risks No Fall Risks Impaired mobility;Impaired vision - -  Follow up Falls evaluation completed Falls evaluation completed;Education provided - - -    FALL RISK PREVENTION PERTAINING TO THE HOME:  Any stairs in or around the home? Yes  If so, are there any without handrails? No  Home free of loose throw rugs in walkways, pet beds, electrical cords, etc? Yes  Adequate lighting in your home to reduce risk of falls? Yes   ASSISTIVE DEVICES UTILIZED TO PREVENT FALLS:  Life alert? No  Use of a cane, walker or w/c? No  Grab bars in the bathroom? No  Shower chair or bench in shower? No  Elevated toilet seat or a handicapped toilet? Yes   TIMED  UP AND GO:  Was the test performed? Yes .  Length of time to ambulate 10 feet: 7 sec.   Gait steady and fast without use of assistive device  Cognitive Function: Normal cognitive status assessed by direct observation by this Nurse Health Advisor. No abnormalities found.       6CIT Screen 04/12/2020  What Year? 0 points  What month? 0 points  What time? 0 points  Count back from 20 0 points  Months in reverse 0 points  Repeat phrase 0 points  Total Score 0    Immunizations Immunization History  Administered Date(s) Administered   Fluad Quad(high Dose 65+) 07/02/2019, 07/22/2020   Influenza Whole 07/23/2012   Influenza, High Dose Seasonal PF 07/04/2018   Influenza,inj,Quad PF,6+ Mos 07/16/2013, 06/19/2016   PFIZER(Purple Top)SARS-COV-2 Vaccination 06/12/2020, 07/03/2020, 05/10/2021   Pneumococcal Conjugate-13 01/03/2018   Pneumococcal Polysaccharide-23 04/08/2019   Td 10/24/2011    TDAP status: Up to date  Flu Vaccine status: Completed at today's visit  Pneumococcal vaccine status: Up to date  Covid-19 vaccine status: Completed vaccines  Qualifies for Shingles Vaccine? Yes   Zostavax completed No   Shingrix Completed?: No.    Education has been provided regarding the importance of this vaccine. Patient has been advised to call insurance company to determine out of pocket expense if they have not yet received this vaccine. Advised may also receive vaccine at local pharmacy or Health Dept. Verbalized acceptance and understanding.  Screening Tests Health Maintenance  Topic Date Due   Zoster Vaccines- Shingrix (1 of 2) Never done   INFLUENZA VACCINE  05/23/2021   COVID-19 Vaccine (4 - Booster for Pfizer series) 08/02/2021   MAMMOGRAM  08/27/2021   TETANUS/TDAP  10/23/2021   COLONOSCOPY (  Pts 45-28yrs Insurance coverage will need to be confirmed)  09/15/2026   DEXA SCAN  Completed   Hepatitis C Screening  Completed   HPV VACCINES  Aged Out    Health  Maintenance  Health Maintenance Due  Topic Date Due   Zoster Vaccines- Shingrix (1 of 2) Never done   INFLUENZA VACCINE  05/23/2021    Colorectal cancer screening: Type of screening: Colonoscopy. Completed 09/16/2019. Repeat every 7 years  Mammogram status: Completed 08/24/2020. Repeat every year  Bone Density status: Completed 03/28/2017. Results reflect: Bone density results: NORMAL. Repeat every 5 years.  Lung Cancer Screening: (Low Dose CT Chest recommended if Age 49-80 years, 30 pack-year currently smoking OR have quit w/in 15years.) does not qualify.   Lung Cancer Screening Referral: no  Additional Screening:  Hepatitis C Screening: does qualify; Completed: yes  Vision Screening: Recommended annual ophthalmology exams for early detection of glaucoma and other disorders of the eye. Is the patient up to date with their annual eye exam?  Yes  Who is the provider or what is the name of the office in which the patient attends annual eye exams? Varney Biles Smith OD. If pt is not established with a provider, would they like to be referred to a provider to establish care? No .   Dental Screening: Recommended annual dental exams for proper oral hygiene  Community Resource Referral / Chronic Care Management: CRR required this visit?  No   CCM required this visit?  No      Plan:     I have personally reviewed and noted the following in the patient's chart:   Medical and social history Use of alcohol, tobacco or illicit drugs  Current medications and supplements including opioid prescriptions.  Functional ability and status Nutritional status Physical activity Advanced directives List of other physicians Hospitalizations, surgeries, and ER visits in previous 12 months Vitals Screenings to include cognitive, depression, and falls Referrals and appointments  In addition, I have reviewed and discussed with patient certain preventive protocols, quality metrics, and best practice  recommendations. A written personalized care plan for preventive services as well as general preventive health recommendations were provided to patient.     Sheral Flow, LPN   [id number]/[id number]   Nurse Notes:  Hearing Screening - Comments:: Patient denied any hearing difficulty.   No hearing aids.  Vision Screening - Comments:: Patient wears corrective glasses/contacts.  Eye exam done annually by: [name], OD.  Medical screening examination/treatment/procedure(s) were performed by non-physician practitioner and as supervising physician I was immediately available for consultation/collaboration.  I agree with above. Lew Dawes, MD

## 2021-07-27 ENCOUNTER — Telehealth: Payer: Self-pay | Admitting: Pharmacist

## 2021-07-27 NOTE — Progress Notes (Signed)
    Chronic Care Management Pharmacy Assistant   Name: Samantha Stevenson  MRN: [id number] DOB: 1951-04-13   Reason for Encounter: Disease State   Conditions to be addressed/monitored: General   Recent office visits:  None ID  Recent consult visits:  06/23/21 [name], MD-Cardiology (Paroxysmal atrial fibrillation) No med changes  06/10/21 Nicholas Lose, MD-Oncology (Acute blood loss anemia) No med changes  05/13/21 Nicholas Lose, MD-Oncology (Acute blood loss anemia)  No med changes  Hospital visits:  None since last coordination call  Medications: Outpatient Encounter Medications as of 07/27/2021  Medication Sig Note   acetaminophen (TYLENOL) 500 MG tablet Take 1,000 mg by mouth every 8 (eight) hours as needed for moderate pain.    aspirin EC 81 MG tablet Take 81 mg by mouth at bedtime. 05/05/2021: On hold 03/31/21 --   B Complex Vitamins (VITAMIN B COMPLEX) TABS Take 1 tablet by mouth daily.    diltiazem (DILT-XR) 240 MG 24 hr capsule TAKE 1 CAPSULE(240 MG) BY MOUTH EVERY MORNING    ferrous sulfate 325 (65 FE) MG tablet Take 325 mg by mouth daily with breakfast.    furosemide (LASIX) 20 MG tablet Take 1 tablet (20 mg total) by mouth daily. Hold until 2/7, start taking 2/7. (Patient taking differently: Take 20 mg by mouth at bedtime.)    Multiple Vitamin (MULTIVITAMIN) tablet Take 2 tablets by mouth daily. Gummies    rivaroxaban (XARELTO) 20 MG TABS tablet Take 1 tablet (20 mg total) by mouth daily with supper. (Patient not taking: Reported on 07/21/2021) 05/05/2021: On hold 04/29/21 --   rosuvastatin (CRESTOR) 20 MG tablet TAKE 1 TABLET BY MOUTH EVERY DAY    No facility-administered encounter medications on file as of 07/27/2021.    Contacted Mariellen Quesada on 07/27/21 for general disease state and medication adherence call.   Patient is not > 5 days past due for refill on the following medications per chart history:  [facility] Medications: Medication Name/mg Last  Fill Days Supply Rosuvastatin 20 mg  05/06/21 90    What concerns do you have about your medications? Patient is not concerns about her medications  The patient denies side effects with her medications.   How often do you forget or accidentally miss a dose? Never  Do you use a pillbox? No  Are you having any problems getting your medications from your pharmacy? No  Has the cost of your medications been a concern? No If yes, what medication and is patient assistance available or has it been applied for?  Since last visit with CPP, no interventions have been made:   The patient has not had an ED visit since last contact.   The patient denies problems with their health.   she denies  concerns or questions for [employer], Pharm. D at this time.    Care Gaps: Annual wellness visit in last year? No Most Recent BP reading and date:122/82, 07/21/21   CCM appointment on 11/07/21    [place] Pharmacist Assistant [phone]

## 2021-07-29 ENCOUNTER — Other Ambulatory Visit: Payer: Self-pay | Admitting: Internal Medicine

## 2021-07-29 NOTE — Telephone Encounter (Signed)
Please refill as per office routine med refill policy (all routine meds to be refilled for 3 mo or monthly (per pt preference) up to one year from last visit, then month to month grace period for 3 mo, then further med refills will have to be denied) ? ?

## 2021-08-09 ENCOUNTER — Other Ambulatory Visit: Payer: Self-pay | Admitting: Internal Medicine

## 2021-08-09 DIAGNOSIS — I480 Paroxysmal atrial fibrillation: Secondary | ICD-10-CM

## 2021-08-15 DIAGNOSIS — L718 Other rosacea: Secondary | ICD-10-CM | POA: Diagnosis not present

## 2021-08-15 DIAGNOSIS — L700 Acne vulgaris: Secondary | ICD-10-CM | POA: Diagnosis not present

## 2021-08-17 ENCOUNTER — Other Ambulatory Visit: Payer: Self-pay | Admitting: Internal Medicine

## 2021-08-17 DIAGNOSIS — I480 Paroxysmal atrial fibrillation: Secondary | ICD-10-CM

## 2021-08-19 ENCOUNTER — Encounter: Payer: Self-pay | Admitting: Internal Medicine

## 2021-08-19 ENCOUNTER — Telehealth: Payer: Self-pay | Admitting: Internal Medicine

## 2021-08-19 NOTE — Telephone Encounter (Signed)
90 day supply has been sent in via rx request.

## 2021-08-19 NOTE — Telephone Encounter (Signed)
Pt. Has called and states she has a 30 day supply of medication, but was supposed to get a 90 day supply of her diltiazem (DILT-XR) 240 MG 24 hr capsule medication. She states the pharmacy informed  her that prescription was sent over that way, but she normally gets a larger amount. Is requesting to have assistant call back to follow- up about change.    Callback #- [phone]

## 2021-08-26 DIAGNOSIS — Z1231 Encounter for screening mammogram for malignant neoplasm of breast: Secondary | ICD-10-CM | POA: Diagnosis not present

## 2021-09-08 NOTE — Progress Notes (Signed)
Patient Care Team: Hoyt Koch, MD as PCP - General (Internal Medicine) [name], MD as PCP - Cardiology (Cardiology) Mcarthur Rossetti, MD (Orthopedic Surgery) Tanda Rockers, MD (Pulmonary Disease) Inda Castle, MD (Inactive) (Gastroenterology) Earnie Larsson, MD as Consulting Physician (Neurosurgery) Charlton Haws, [facility] as Pharmacist (Pharmacist) [name], [place] (Optometry) Loletha Carrow Kirke Corin, MD as Consulting Physician (Gastroenterology) Lorelle Gibbs, MD (Radiology) Paulla Dolly Tamala Fothergill, DPM as Consulting Physician (Podiatry)  DIAGNOSIS:    ICD-10-CM   1. Acute blood loss anemia  D62       CHIEF COMPLIANT: Follow-up of hematuria  INTERVAL HISTORY: Samantha Stevenson is a 70 y.o. with above-mentioned history of hematuria. She presents to the clinic today for follow-up.  The hematuria has resolved and she is starting to feel better.  She has not had any other signs and symptoms of bleeding.  ALLERGIES:  is allergic to penicillins, shellfish allergy, latex, banana, kiwi extract, lactose intolerance (gi), oxycodone, asa [aspirin], celebrex [celecoxib], and nsaids.  MEDICATIONS:  Current Outpatient Medications  Medication Sig Dispense Refill   acetaminophen (TYLENOL) 500 MG tablet Take 1,000 mg by mouth every 8 (eight) hours as needed for moderate pain.     aspirin EC 81 MG tablet Take 81 mg by mouth at bedtime.     B Complex Vitamins (VITAMIN B COMPLEX) TABS Take 1 tablet by mouth daily.     diltiazem (DILT-XR) 240 MG 24 hr capsule TAKE 1 CAPSULE(240 MG) BY MOUTH EVERY MORNING 90 capsule 1   ferrous sulfate 325 (65 FE) MG tablet Take 325 mg by mouth daily with breakfast.     furosemide (LASIX) 20 MG tablet Take 1 tablet (20 mg total) by mouth daily. Hold until 2/7, start taking 2/7. (Patient taking differently: Take 20 mg by mouth at bedtime.)     Multiple Vitamin (MULTIVITAMIN) tablet Take 2 tablets by mouth daily. Gummies      rivaroxaban (XARELTO) 20 MG TABS tablet Take 1 tablet (20 mg total) by mouth daily with supper. (Patient not taking: Reported on 07/21/2021) 90 tablet 3   rosuvastatin (CRESTOR) 20 MG tablet TAKE 1 TABLET BY MOUTH EVERY DAY 90 tablet 0   No current facility-administered medications for this visit.    PHYSICAL EXAMINATION: ECOG PERFORMANCE STATUS: 0 - Asymptomatic  Vitals:   09/09/21 1019  BP: (!) 154/75  Pulse: 74  Resp: 16  Temp: 97.7 F (36.5 C)  SpO2: 98%   Filed Weights   09/09/21 1019  Weight: 170 lb 9.6 oz (77.4 kg)    LABORATORY DATA:  I have reviewed the data as listed CMP Latest Ref Rng & Units 05/13/2021 03/10/2021 01/20/2021  Glucose 70 - 99 mg/dL 82 90 92  BUN 8 - 23 mg/dL 19 38(H) 18  Creatinine 0.44 - 1.00 mg/dL 1.13(H) 1.65(H) 1.07  Sodium 135 - 145 mmol/L 143 138 140  Potassium 3.5 - 5.1 mmol/L 4.0 4.0 3.6  Chloride 98 - 111 mmol/L 108 102 103  CO2 22 - 32 mmol/L 28 25 30   Calcium 8.9 - 10.3 mg/dL 9.3 9.2 9.2  Total Protein 6.5 - 8.1 g/dL 7.1 7.4 7.1  Total Bilirubin 0.3 - 1.2 mg/dL 0.4 0.4 0.5  Alkaline Phos 38 - 126 U/L 88 81 92  AST 15 - 41 U/L 19 22 20   ALT 0 - 44 U/L 14 14 12     Lab Results  Component Value Date   WBC 5.2 09/09/2021   HGB 9.9 (L) 09/09/2021  HCT 32.8 (L) 09/09/2021   MCV 88.2 09/09/2021   PLT 267 09/09/2021   NEUTROABS 2.7 09/09/2021    ASSESSMENT & PLAN:  Acute blood loss anemia Recurrent hematuria since January 2022: Status post urological procedures.  It appears that follow-up scopes did not reveal any source of bleeding.  She develops these bleeding symptoms every time she exerts.   Prior treatments: Aspirin and Xarelto (aspirin was discontinued 03/31/2021), takes Xarelto for atrial fibrillation (stopped 04/29/2021)   Longstanding history of anemia her baseline hemoglobin is around 10-11  03/10/2021 showed a hemoglobin of 9.8 04/01/2021: Hemoglobin 9.6, ferritin 23, iron saturation 10%, factor VIII-227% 04/29/2021: Hemoglobin  9.2 05/13/2021: Hemoglobin 8.7 06/10/2021: Hemoglobin is 9.6, ferritin 20, iron saturation 12%, TIBC 386 09/09/2021: Hemoglobin 9.9, iron studies are normal   She is taking oral iron therapy and appears to be tolerating it fairly well.  She does have constipation. Anticoagulation canceled  Return to clinic on an as-needed basis.  No orders of the defined types were placed in this encounter.  The patient has a good understanding of the overall plan. she agrees with it. she will call with any problems that may develop before the next visit here.  Total time spent: 20 mins including face to face time and time spent for planning, charting and coordination of care  Rulon Eisenmenger, MD, MPH 09/09/2021  I, Thana Ates, am acting as scribe for Dr. Nicholas Lose.  I have reviewed the above documentation for accuracy and completeness, and I agree with the above.

## 2021-09-09 ENCOUNTER — Other Ambulatory Visit: Payer: Self-pay

## 2021-09-09 ENCOUNTER — Inpatient Hospital Stay: Payer: Medicare Other | Attending: Hematology and Oncology

## 2021-09-09 ENCOUNTER — Inpatient Hospital Stay (HOSPITAL_BASED_OUTPATIENT_CLINIC_OR_DEPARTMENT_OTHER): Payer: Medicare Other | Admitting: Hematology and Oncology

## 2021-09-09 DIAGNOSIS — Z880 Allergy status to penicillin: Secondary | ICD-10-CM | POA: Insufficient documentation

## 2021-09-09 DIAGNOSIS — N029 Recurrent and persistent hematuria with unspecified morphologic changes: Secondary | ICD-10-CM | POA: Diagnosis not present

## 2021-09-09 DIAGNOSIS — D62 Acute posthemorrhagic anemia: Secondary | ICD-10-CM

## 2021-09-09 DIAGNOSIS — Z79899 Other long term (current) drug therapy: Secondary | ICD-10-CM | POA: Insufficient documentation

## 2021-09-09 DIAGNOSIS — Z7901 Long term (current) use of anticoagulants: Secondary | ICD-10-CM | POA: Insufficient documentation

## 2021-09-09 DIAGNOSIS — Z886 Allergy status to analgesic agent status: Secondary | ICD-10-CM | POA: Diagnosis not present

## 2021-09-09 DIAGNOSIS — K5900 Constipation, unspecified: Secondary | ICD-10-CM | POA: Insufficient documentation

## 2021-09-09 DIAGNOSIS — Z885 Allergy status to narcotic agent status: Secondary | ICD-10-CM | POA: Insufficient documentation

## 2021-09-09 DIAGNOSIS — I4891 Unspecified atrial fibrillation: Secondary | ICD-10-CM | POA: Diagnosis not present

## 2021-09-09 LAB — CBC WITH DIFFERENTIAL (CANCER CENTER ONLY)
Abs Immature Granulocytes: 0.02 10*3/uL (ref 0.00–0.07)
Basophils Absolute: 0 10*3/uL (ref 0.0–0.1)
Basophils Relative: 0 %
Eosinophils Absolute: 0.2 10*3/uL (ref 0.0–0.5)
Eosinophils Relative: 3 %
HCT: 32.8 % — ABNORMAL LOW (ref 36.0–46.0)
Hemoglobin: 9.9 g/dL — ABNORMAL LOW (ref 12.0–15.0)
Immature Granulocytes: 0 %
Lymphocytes Relative: 37 %
Lymphs Abs: 1.9 10*3/uL (ref 0.7–4.0)
MCH: 26.6 pg (ref 26.0–34.0)
MCHC: 30.2 g/dL (ref 30.0–36.0)
MCV: 88.2 fL (ref 80.0–100.0)
Monocytes Absolute: 0.4 10*3/uL (ref 0.1–1.0)
Monocytes Relative: 8 %
Neutro Abs: 2.7 10*3/uL (ref 1.7–7.7)
Neutrophils Relative %: 52 %
Platelet Count: 267 10*3/uL (ref 150–400)
RBC: 3.72 MIL/uL — ABNORMAL LOW (ref 3.87–5.11)
RDW: 15.6 % — ABNORMAL HIGH (ref 11.5–15.5)
WBC Count: 5.2 10*3/uL (ref 4.0–10.5)
nRBC: 0 % (ref 0.0–0.2)

## 2021-09-09 LAB — IRON AND TIBC
Iron: 83 ug/dL (ref 41–142)
Saturation Ratios: 25 % (ref 21–57)
TIBC: 328 ug/dL (ref 236–444)
UIBC: 245 ug/dL (ref 120–384)

## 2021-09-09 LAB — FERRITIN: Ferritin: 28 ng/mL (ref 11–307)

## 2021-09-09 NOTE — Assessment & Plan Note (Signed)
Recurrent hematuria since January 2022: Status post urological procedures. It appears that follow-up scopes did not reveal any source of bleeding. She develops these bleeding symptoms every time she exerts.  Prior treatments: Aspirin and Xarelto (aspirin was discontinued 03/31/2021), takes Xarelto for atrial fibrillation(stopped 04/29/2021)  Longstanding history of anemia her baseline hemoglobin is around 10-11 03/10/2021 showed a hemoglobin of 9.8 04/01/2021: Hemoglobin 9.6, ferritin 23, iron saturation 10%, factor VIII-227% 04/29/2021: Hemoglobin 9.2 05/13/2021:Hemoglobin 8.7 06/10/2021: Hemoglobin is 9.6, ferritin 20, iron saturation 12%, TIBC 386 09/09/2021:  She is taking oral iron therapy and appears to be tolerating it fairly well.  She does have constipation. Anticoagulation on hold

## 2021-10-13 DIAGNOSIS — Z23 Encounter for immunization: Secondary | ICD-10-CM | POA: Diagnosis not present

## 2021-10-22 ENCOUNTER — Other Ambulatory Visit: Payer: Self-pay | Admitting: Internal Medicine

## 2021-10-22 NOTE — Telephone Encounter (Signed)
Please refill as per office routine med refill policy (all routine meds to be refilled for 3 mo or monthly (per pt preference) up to one year from last visit, then month to month grace period for 3 mo, then further med refills will have to be denied) ? ?

## 2021-11-07 ENCOUNTER — Telehealth: Payer: Medicare Other

## 2021-11-08 DIAGNOSIS — M25512 Pain in left shoulder: Secondary | ICD-10-CM | POA: Diagnosis not present

## 2021-11-08 DIAGNOSIS — M7502 Adhesive capsulitis of left shoulder: Secondary | ICD-10-CM | POA: Diagnosis not present

## 2021-11-14 DIAGNOSIS — R809 Proteinuria, unspecified: Secondary | ICD-10-CM | POA: Diagnosis not present

## 2021-11-15 DIAGNOSIS — L718 Other rosacea: Secondary | ICD-10-CM | POA: Diagnosis not present

## 2021-11-15 DIAGNOSIS — L700 Acne vulgaris: Secondary | ICD-10-CM | POA: Diagnosis not present

## 2021-11-21 DIAGNOSIS — R809 Proteinuria, unspecified: Secondary | ICD-10-CM | POA: Diagnosis not present

## 2021-11-21 DIAGNOSIS — N200 Calculus of kidney: Secondary | ICD-10-CM | POA: Diagnosis not present

## 2021-11-21 DIAGNOSIS — R3129 Other microscopic hematuria: Secondary | ICD-10-CM | POA: Diagnosis not present

## 2021-11-21 DIAGNOSIS — I5032 Chronic diastolic (congestive) heart failure: Secondary | ICD-10-CM | POA: Diagnosis not present

## 2021-11-21 DIAGNOSIS — I10 Essential (primary) hypertension: Secondary | ICD-10-CM | POA: Diagnosis not present

## 2021-11-21 DIAGNOSIS — R82998 Other abnormal findings in urine: Secondary | ICD-10-CM | POA: Diagnosis not present

## 2021-11-23 DIAGNOSIS — M542 Cervicalgia: Secondary | ICD-10-CM | POA: Diagnosis not present

## 2021-11-23 DIAGNOSIS — M25512 Pain in left shoulder: Secondary | ICD-10-CM | POA: Diagnosis not present

## 2021-11-30 DIAGNOSIS — M542 Cervicalgia: Secondary | ICD-10-CM | POA: Diagnosis not present

## 2021-11-30 DIAGNOSIS — M25512 Pain in left shoulder: Secondary | ICD-10-CM | POA: Diagnosis not present

## 2021-12-08 DIAGNOSIS — M542 Cervicalgia: Secondary | ICD-10-CM | POA: Diagnosis not present

## 2021-12-08 DIAGNOSIS — M25512 Pain in left shoulder: Secondary | ICD-10-CM | POA: Diagnosis not present

## 2021-12-16 DIAGNOSIS — M25512 Pain in left shoulder: Secondary | ICD-10-CM | POA: Diagnosis not present

## 2021-12-16 DIAGNOSIS — M542 Cervicalgia: Secondary | ICD-10-CM | POA: Diagnosis not present

## 2021-12-20 ENCOUNTER — Ambulatory Visit (INDEPENDENT_AMBULATORY_CARE_PROVIDER_SITE_OTHER): Payer: Medicare Other

## 2021-12-20 DIAGNOSIS — D500 Iron deficiency anemia secondary to blood loss (chronic): Secondary | ICD-10-CM

## 2021-12-20 DIAGNOSIS — I10 Essential (primary) hypertension: Secondary | ICD-10-CM | POA: Diagnosis not present

## 2021-12-20 DIAGNOSIS — I2510 Atherosclerotic heart disease of native coronary artery without angina pectoris: Secondary | ICD-10-CM | POA: Diagnosis not present

## 2021-12-20 DIAGNOSIS — I480 Paroxysmal atrial fibrillation: Secondary | ICD-10-CM

## 2021-12-20 DIAGNOSIS — I4891 Unspecified atrial fibrillation: Secondary | ICD-10-CM | POA: Diagnosis not present

## 2021-12-20 DIAGNOSIS — I5032 Chronic diastolic (congestive) heart failure: Secondary | ICD-10-CM

## 2021-12-20 DIAGNOSIS — M25512 Pain in left shoulder: Secondary | ICD-10-CM | POA: Diagnosis not present

## 2021-12-20 DIAGNOSIS — E785 Hyperlipidemia, unspecified: Secondary | ICD-10-CM

## 2021-12-20 NOTE — Patient Instructions (Signed)
Visit Information  Following are the goals we discussed today:   Manage My Medicine   Timeframe:  Long-Range Goal Priority:  Medium Start Date:      05/05/21                       Expected End Date:  12/20/2022                    Follow Up Date 06/19/2022   - call for medicine refill 2 or 3 days before it runs out - call if I am sick and can't take my medicine - keep a list of all the medicines I take; vitamins and herbals too - use a pillbox to sort medicine    Why is this important?   These steps will help you keep on track with your medicines.  Plan: Telephone follow up appointment with care management team member scheduled for:  6 months The patient has been provided with contact information for the care management team and has been advised to call with any health related questions or concerns.   Tomasa Blase, PharmD Clinical Pharmacist, [facility]   Please call the care guide team at [phone] if you need to cancel or reschedule your appointment.   Patient verbalizes understanding of instructions and care plan provided today and agrees to view in [place]. Active MyChart status confirmed with patient.

## 2021-12-20 NOTE — Progress Notes (Signed)
Chronic Care Management Pharmacy Note  12/20/2021 Name:  Samantha Stevenson MRN:  [id number] DOB:  Jul 04, 1951  Summary: -Patient reports that hematuria has resolved, it has been decided by heme/onc patient will not restart xarelto - has restarted ASA without any issues -Reports that she only takes iron supplement maybe once a month due to constipation caused from taking more frequently -BP at goal when checked at home per patient   Recommendations/Changes made from today's visit: -Recommended for patient to trial accrufer (ferric maltol) as an option for iron supplementation with lower occurrence of GI issues (I will send a message to Dr. Lindi Adie about prescription) -Patient requesting prescription for miralax from PCP to use prn as she is currently using OTC but can would like rx as it would be cheaper  - Recommending for patient to have updated lipid panel with next PCP visit, to increase statin intensity / trial addition of ezetimibe or nexletol if necessary     Subjective: Samantha Stevenson is an 71 y.o. year old female who is a primary patient of Hoyt Koch, MD.  The CCM team was consulted for assistance with disease management and care coordination needs.    Engaged with patient by telephone for follow up visit in response to provider referral for pharmacy case management and/or care coordination services.   Consent to Services:  The patient was given information about Chronic Care Management services, agreed to services, and gave verbal consent prior to initiation of services.  Please see initial visit note for detailed documentation.   Patient Care Team: Hoyt Koch, MD as PCP - General (Internal Medicine) [name], MD as PCP - Cardiology (Cardiology) Mcarthur Rossetti, MD (Orthopedic Surgery) Tanda Rockers, MD (Pulmonary Disease) Inda Castle, MD (Inactive) (Gastroenterology) Earnie Larsson, MD as Consulting Physician  (Neurosurgery) [name], [place] (Optometry) Loletha Carrow Kirke Corin, MD as Consulting Physician (Gastroenterology) Lorelle Gibbs, MD (Radiology) Paulla Dolly Tamala Fothergill, DPM as Consulting Physician (Podiatry) Tomasa Blase, [facility] (Pharmacist)  Recent office visits: 02/14/21 Dr Sharlet Salina OV: acute visit for ongoing hematuria. Consulted w/ [place], reduce Xarelto to 10 mg and monitor bleeding.  Recent consult visits: 09/09/2021 - Dr. Lindi Adie - Hematology / Oncology - f/u of hematuria - hematuria resolved - continue iron supplementation - AC remains on hold at this time  06/23/2021 - Dr. Curt Bears - Cardiology - no further issues with Afib - continue hold of xarelto - f/u in 12 months  06/10/2021 - Dr. Lindi Adie - Heme/Onc - f/u of hematuria - ASA and xarelto remain on hold follow up in 3 months   Hospital visits: Medication Reconciliation was completed by comparing discharge summary, patients EMR and Pharmacy list, and upon discussion with patient.  Admitted to the ED on 03/10/21 due to chest pain. Discharge date was 03/10/21. Discharged from [facility].    Medications that remain the same after Hospital Discharge:??  -All other medications will remain the same.     Objective:  Lab Results  Component Value Date   CREATININE 1.13 (H) 05/13/2021   BUN 19 05/13/2021   GFR 53.54 (L) 01/20/2021   GFRNONAA 53 (L) 05/13/2021   GFRAA >60 01/29/2020   NA 143 05/13/2021   K 4.0 05/13/2021   CALCIUM 9.3 05/13/2021   CO2 28 05/13/2021   GLUCOSE 82 05/13/2021    Lab Results  Component Value Date/Time   HGBA1C 6.2 06/19/2016 11:51 AM   HGBA1C 6.1 06/23/2015 11:20 AM   GFR 53.54 (L)  01/20/2021 09:24 AM   GFR 59.52 (L) 12/21/2020 03:51 PM    Last diabetic Eye exam: No results found for: HMDIABEYEEXA  Last diabetic Foot exam: No results found for: HMDIABFOOTEX   Lab Results  Component Value Date   CHOL 189 01/03/2018   HDL 46.70 01/03/2018   LDLCALC 117 (H) 01/03/2018   TRIG 128.0  01/03/2018   CHOLHDL 4 01/03/2018    Hepatic Function Latest Ref Rng & Units 05/13/2021 03/10/2021 01/20/2021  Total Protein 6.5 - 8.1 g/dL 7.1 7.4 7.1  Albumin 3.5 - 5.0 g/dL 3.3(L) 3.6 3.9  AST 15 - 41 U/L _0 ALT 0 - 44 U/L _1 Alk Phosphatase 38 - 126 U/L 88 81 92  Total Bilirubin 0.3 - 1.2 mg/dL 0.4 0.4 0.5  Bilirubin, Direct 0.0 - 0.2 mg/dL - <0.1 -    Lab Results  Component Value Date/Time   TSH 1.43 12/21/2020 03:51 PM   TSH 2.24 08/27/2013 10:02 AM    CBC Latest Ref Rng & Units 09/09/2021 06/10/2021 05/13/2021  WBC 4.0 - 10.5 K/uL 5.2 6.8 5.1  Hemoglobin 12.0 - 15.0 g/dL 9.9(L) 9.6(L) 8.7(L)  Hematocrit 36.0 - 46.0 % 32.8(L) 30.7(L) 28.5(L)  Platelets 150 - 400 K/uL 267 298 268    No results found for: VD25OH  Clinical ASCVD: Yes  The ASCVD Risk score (Arnett DK, et al., 2019) failed to calculate for the following reasons:   The patient has a prior MI or stroke diagnosis    Depression screen [facility] 2/9 07/21/2021 04/12/2020 04/08/2019  Decreased Interest 0 0 0  Down, Depressed, Hopeless 0 0 0  PHQ - 2 Score 0 0 0  Altered sleeping - - -  Tired, decreased energy - - -  Change in appetite - - -  Feeling bad or failure about yourself  - - -  Trouble concentrating - - -  Moving slowly or fidgety/restless - - -  Suicidal thoughts - - -  PHQ-9 Score - - -  Difficult doing work/chores - - -  Some recent data might be hidden      Social History   Tobacco Use  Smoking Status Never  Smokeless Tobacco Never   BP Readings from Last 3 Encounters:  09/09/21 (!) 154/75  07/21/21 122/82  06/23/21 130/78   Pulse Readings from Last 3 Encounters:  09/09/21 74  07/21/21 73  06/23/21 78   Wt Readings from Last 3 Encounters:  09/09/21 170 lb 9.6 oz (77.4 kg)  07/21/21 170 lb 12.8 oz (77.5 kg)  06/23/21 172 lb 3.2 oz (78.1 kg)   BMI Readings from Last 3 Encounters:  09/09/21 29.28 kg/m  07/21/21 29.32 kg/m  06/23/21 29.56 kg/m     Assessment/Interventions: Review of patient past medical history, allergies, medications, health status, including review of consultants reports, laboratory and other test data, was performed as part of comprehensive evaluation and provision of chronic care management services.   SDOH:  (Social Determinants of Health) assessments and interventions performed: Yes  SDOH Screenings   Alcohol Screen: Low Risk    Last Alcohol Screening Score (AUDIT): 0  Depression (PHQ2-9): Low Risk    PHQ-2 Score: 0  Financial Resource Strain: Low Risk    Difficulty of Paying Living Expenses: Not hard at all  Food Insecurity: No Food Insecurity   Worried About Charity fundraiser in the Last Year: Never true   Ran Out of Food in the Last Year: Never true  Housing:  Low Risk    Last Housing Risk Score: 0  Physical Activity: Sufficiently Active   Days of Exercise per Week: 5 days   Minutes of Exercise per Session: 30 min  Social Connections: Moderately Integrated   Frequency of Communication with Friends and Family: More than three times a week   Frequency of Social Gatherings with Friends and Family: More than three times a week   Attends Religious Services: More than 4 times per year   Active Member of [employer] or Organizations: Yes   Attends Music therapist: More than 4 times per year   Marital Status: Never married  Stress: No Stress Concern Present   Feeling of Stress : Not at all  Tobacco Use: Low Risk    Smoking Tobacco Use: Never   Smokeless Tobacco Use: Never   Passive Exposure: Not on file  Transportation Needs: No Transportation Needs   Lack of Transportation (Medical): No   Lack of Transportation (Non-Medical): No    CCM Care Plan  Allergies  Allergen Reactions   Penicillins Swelling and Other (See Comments)    Severe swelling PATIENT HAS HAD A PCN REACTION WITH IMMEDIATE RASH, FACIAL/TONGUE/THROAT SWELLING, SOB, OR LIGHTHEADEDNESS WITH HYPOTENSION:  #  #  YES  #  #   Has patient had a PCN reaction causing severe rash involving mucus membranes or skin necrosis: No PATIENT HAS HAD A PCN REACTION THAT REQUIRED HOSPITALIZATION:  #  #  YES  #  #  Has patient had a PCN reaction occurring within the last 10 years: No   Shellfish Allergy Anaphylaxis   Latex Hives and Rash   Banana Nausea Only   Kiwi Extract    Lactose Intolerance (Gi) Diarrhea and Nausea And Vomiting   Oxycodone Other (See Comments)    Pt stated, "Made me feel really drowsy" Tolerating as of 02/16/20 after neck surgery   Asa [Aspirin] Nausea And Vomiting    Makes stomach upset Asprin 325   Celebrex [Celecoxib] Nausea And Vomiting    GI upset   Nsaids Rash    Medications Reviewed Today     Reviewed by Tomasa Blase, [employer] (Pharmacist) on 12/20/21 at 1353  Med List Status: <None>   Medication Order Taking? Sig Documenting Provider Last Dose Status Informant  acetaminophen (TYLENOL) 500 MG tablet [id number] Yes Take 1,000 mg by mouth every 8 (eight) hours as needed for moderate pain. [provider] Taking Active Self  aspirin EC 81 MG tablet [id number] Yes Take 81 mg by mouth at bedtime. [provider] Taking Active            Med Note Delice Bison, Darnelle Maffucci   Tue Dec 20, 2021  1:47 PM)    B Complex Vitamins (VITAMIN B COMPLEX) TABS [id number] Yes Take 1 tablet by mouth daily. [provider] Taking Active   diltiazem (DILT-XR) 240 MG 24 hr capsule [id number] Yes TAKE 1 CAPSULE(240 MG) BY MOUTH EVERY MORNING Hoyt Koch, MD Taking Active   ferrous sulfate 325 (65 FE) MG tablet [id number] No Take 325 mg by mouth daily with breakfast.  Patient not taking: Reported on 12/20/2021   [provider] Not Taking Active Self  furosemide (LASIX) 20 MG tablet [id number] Yes Take 1 tablet (20 mg total) by mouth daily. Hold until 2/7, start taking 2/7.  Patient taking differently: Take 20 mg by mouth at bedtime.   Samuella Cota, MD Taking Active Self   Multiple Vitamin (MULTIVITAMIN) tablet [id number] Yes Take  2 tablets by mouth daily. Gummies [provider] Taking Active Self  rosuvastatin (CRESTOR) 20 MG tablet [id number] Yes TAKE 1 TABLET BY MOUTH EVERY DAY Hoyt Koch, MD Taking Active             Patient Active Problem List   Diagnosis Date Noted   Chronic diastolic CHF (congestive heart failure) ([place]) 11/24/2020   Acute blood loss anemia 11/23/2020   Acute lateral meniscal tear, left, subsequent encounter 11/16/2020   Cervical spondylosis with myelopathy and radiculopathy 02/02/2020   Chronic neck pain 07/17/2019   LLQ pain 05/16/2019   Chronic bilateral low back pain without sciatica 10/07/2018   Pain and swelling of left lower leg 08/08/2018   Left arm swelling 08/08/2018   Hematuria, gross 07/29/2018   Thoracic aortic dissection 06/13/2018   Intramural aortic hematoma ([place]) 05/28/2018   Dysfunction of left eustachian tube 04/05/2018   Preoperative clearance 02/27/2018   Bilateral leg edema 02/27/2018   DOE (dyspnea on exertion) 06/19/2016   Routine general medical examination at a health care facility 06/24/2015   Constipation 03/17/2015   Myalgia and myositis 03/17/2015   Dyslipidemia    Atrial fibrillation ([place])    Hypertension    History of pulmonary embolism 01/10/2013   Anemia 01/10/2013   Arthritis 01/03/2013    Immunization History  Administered Date(s) Administered   Fluad Quad(high Dose 65+) 07/02/2019, 07/22/2020, 07/21/2021   Influenza Whole 07/23/2012   Influenza, High Dose Seasonal PF 07/04/2018   Influenza,inj,Quad PF,6+ Mos 07/16/2013, 06/19/2016   PFIZER(Purple Top)SARS-COV-2 Vaccination 06/12/2020, 07/03/2020, 05/10/2021   Pneumococcal Conjugate-13 01/03/2018   Pneumococcal Polysaccharide-23 04/08/2019   Td 10/24/2011    Conditions to be addressed/monitored:  Hypertension, Hyperlipidemia, Atrial Fibrillation, and Heart Failure, anemia  Care Plan : CCM Pharmacy Care  Plan  Updates made by Tomasa Blase, RPH since 12/20/2021 12:00 AM     Problem: Hypertension, Hyperlipidemia, Atrial Fibrillation, and Heart Failure, Anemia   Priority: High     Long-Range Goal: Disease management   Start Date: 05/06/2021  Expected End Date: 05/06/2022  This Visit's Progress: On track  Recent Progress: On track  Priority: High  Note:   Current Barriers:  Unable to independently monitor therapeutic efficacy  Pharmacist Clinical Goal(s):  Patient will achieve adherence to monitoring guidelines and medication adherence to achieve therapeutic efficacy through collaboration with PharmD and provider.   Interventions: 1:1 collaboration with Hoyt Koch, MD regarding development and update of comprehensive plan of care as evidenced by provider attestation and co-signature Inter-disciplinary care team collaboration (see longitudinal plan of care) Comprehensive medication review performed; medication list updated in electronic medical record  AFIB/Hypertension    Patient is currently rate controlled.  BP goal < 130/80 Patient checks BP at home infrequently Patient home BP readings are ranging: "normal" BP Readings from Last 3 Encounters:  09/09/21 (!) 154/75  07/21/21 122/82  06/23/21 130/78  Patient has failed these meds in past: digoxin Patient is currently controlled on the following medications: Diltiazem 240 mg qAM, Xarelto 20 mg daily - on hold due to previous issues with hematuria - has since resolved  ASA 82m daily (no issues with hematuria since restarting) furosemide 20 mg daily   We discussed:  Per heme/onc provider, patient to continue hold of xarelto - has been ok'd to restart ASA     Plan: Continue current medications; keep appt with heme/onc re: holding Xarelto   Hyperlipidemia/CAD    LDL goal < 70 Lab Results  Component Value Date  [place] 117 (H) 01/03/2018  Hx MI (2000), carotid artery disease w/ stent   Patient has failed  these meds in past: n/a Patient is currently controlled on the following medications: rosuvastatin 20 mg daily aspirin 81 mg daily   We discussed:  Pt reports compliance with rosuvastain; aspirin has been restarted, denies any issues since restarting  -last lipid panel was 12/2017, LDL was above goal at that time, pt has been on rosuvastatin 20 mg since 2017; pt is due for repeat lipid panel, may consider increasing rosuvastatin vs adding ezetimibe or nexletol depending on results   Plan Continue current medications and control with diet and exercise  Repeat lipid panel due   Anemia    Patient has failed these meds in past: n/a Patient is currently controlled on the following medications: ferrous sulfate 325 mg once weekly - not taking - takes ~1 x monthly    We discussed:  Possible trial of accrufer (ferric maltol) - will reach out to heme/onc about this    Plan: F/U with hematology re: accrufer  Patient Goals/Self-Care Activities Patient will:  - take medications as prescribed focus on medication adherence by routine -Continue holding Xarelto -Schedule physical with PCP at your convenience - cholesterol test needed       Medication Assistance: None required.  Patient affirms current coverage meets needs.  Compliance/Adherence/Medication fill history: Care Gaps: Shingrix Covid booster  Mammogram Tdap  Patient's preferred pharmacy is:  [facility] [id number], [place] [address] Phone: [phone] Fax: [phone]   Uses pill box? Yes Pt endorses 100% compliance  We discussed: Benefits of medication synchronization, packaging and delivery as well as enhanced pharmacist oversight with [employer]. Patient decided to: Continue current medication management strategy  Care Plan and Follow Up Patient Decision:  Patient agrees to Care Plan and Follow-up.  Plan: Telephone follow up appointment with care  management team member scheduled for:  6 months  Tomasa Blase, PharmD Clinical Pharmacist, [place]

## 2021-12-26 ENCOUNTER — Other Ambulatory Visit: Payer: Self-pay | Admitting: Internal Medicine

## 2021-12-26 MED ORDER — POLYETHYLENE GLYCOL 3350 17 GM/SCOOP PO POWD: 17.0000 g | Freq: Every day | ORAL | 11 refills | Status: DC

## 2022-01-24 ENCOUNTER — Ambulatory Visit: Payer: Medicare Other | Admitting: Gastroenterology

## 2022-02-17 ENCOUNTER — Other Ambulatory Visit: Payer: Self-pay | Admitting: Internal Medicine

## 2022-02-19 ENCOUNTER — Encounter: Payer: Self-pay | Admitting: Internal Medicine

## 2022-02-20 ENCOUNTER — Other Ambulatory Visit: Payer: Self-pay

## 2022-02-20 MED ORDER — ROSUVASTATIN CALCIUM 20 MG PO TABS: ORAL_TABLET | ORAL | 0 refills | Status: DC

## 2022-02-20 MED ORDER — FUROSEMIDE 20 MG PO TABS: 20.0000 mg | ORAL_TABLET | Freq: Every day | ORAL | 0 refills | Status: DC

## 2022-02-23 DIAGNOSIS — Z20822 Contact with and (suspected) exposure to covid-19: Secondary | ICD-10-CM | POA: Diagnosis not present

## 2022-03-07 ENCOUNTER — Ambulatory Visit: Payer: Medicare Other | Admitting: Gastroenterology

## 2022-03-08 ENCOUNTER — Ambulatory Visit (INDEPENDENT_AMBULATORY_CARE_PROVIDER_SITE_OTHER): Payer: Medicare Other | Admitting: Gastroenterology

## 2022-03-08 ENCOUNTER — Encounter: Payer: Self-pay | Admitting: Gastroenterology

## 2022-03-08 VITALS — BP 138/88 | HR 85 | Ht 62.5 in | Wt 169.5 lb

## 2022-03-08 DIAGNOSIS — K5909 Other constipation: Secondary | ICD-10-CM | POA: Diagnosis not present

## 2022-03-08 DIAGNOSIS — R140 Abdominal distension (gaseous): Secondary | ICD-10-CM | POA: Diagnosis not present

## 2022-03-08 DIAGNOSIS — R1084 Generalized abdominal pain: Secondary | ICD-10-CM

## 2022-03-08 MED ORDER — LINACLOTIDE 72 MCG PO CAPS: 72.0000 ug | ORAL_CAPSULE | Freq: Every day | ORAL | 0 refills | Status: DC

## 2022-03-08 MED ORDER — LINACLOTIDE 145 MCG PO CAPS: 145.0000 ug | ORAL_CAPSULE | Freq: Every day | ORAL | 0 refills | Status: DC

## 2022-03-08 NOTE — Patient Instructions (Signed)
If you are age 69 or older, your body mass index should be between 23-30. Your Body mass index is 30.51 kg/m?Marland Kitchen If this is out of the aforementioned range listed, please consider follow up with your Primary Care Provider. ? ?If you are age 40 or younger, your body mass index should be between 19-25. Your Body mass index is 30.51 kg/m?Marland Kitchen If this is out of the aformentioned range listed, please consider follow up with your Primary Care Provider.  ? ?________________________________________________________ ? ?The [facility] providers would like to encourage you to use [facility] to communicate with providers for non-urgent requests or questions.  Due to long hold times on the telephone, sending your provider a message by [facility] may be a faster and more efficient way to get a response.  Please allow 48 business hours for a response.  Please remember that this is for non-urgent requests.  ?_______________________________________________________ ? ?We have given you samples of the following medication to take: Linzess 37mg and  Linzess 1462m. Try the Linzess 7259mone a day and increase to the Linzess 145m61mf no improvement. ? ?You have been scheduled for a CT scan of the abdomen and pelvis at [facility]. You are scheduled on 03-17-2022  at 12:30pm. You should arrive 30 minutes prior to your appointment time for registration.  Please pick up 2 bottles of contrast from [facility] 3 days prior to your scan. The solution may taste better if refrigerated, but do NOT add ice or any other liquid to this solution. Shake well before drinking.  ? ?Please follow the written instructions below on the day of your exam:  ? ?1) Do not eat anything after 8:30am (4 hours prior to your test)  ? ?2) Drink 1 bottle of contrast @ 10:30am (2 hours prior to your exam)  Remember to shake well before drinking and do NOT pour over ice. ?    Drink 1 bottle of contrast @ 11:30am (1 hour prior to your exam)   ? ?You may take any medications as prescribed with a small amount of water, if necessary. If you take any of the following medications: METFORMIN, GLUCOPHAGE, GLUCOVANCE, AVANDAMET, RIOMET, FORTAMET, ACTOPLUS MET, JANUMET, GLUMBranchMETAGLIP, you MAY be asked to HOLD this medication 48 hours AFTER the exam.  ? ?The purpose of you drinking the oral contrast is to aid in the visualization of your intestinal tract. The contrast solution may cause some diarrhea. Depending on your individual set of symptoms, you may also receive an intravenous injection of x-ray contrast/dye. Plan on being at [facility] 45 minutes or longer, depending on the type of exam you are having performed.  ? ?If you have any questions regarding your exam or if you need to reschedule, you may call [facility] at [phone] the hours of 8:00 am and 5:00 pm, Monday-Friday.  ? ?It was a pleasure to see you today! ? ?Thank you for trusting me with your gastrointestinal care!   ? ? ? ?                                                                                                      ? ? ?

## 2022-03-08 NOTE — Progress Notes (Signed)
? ? ? ?[facility] Progress Note ? ?Chief Complaint: Abdominal pain and chronic constipation ? ?Subjective  ?History: ?Samantha Stevenson was seen here November 2020 for chronic generalized abdominal pain with bloating, constipation and dysphagia.  She was known to have severe diverticulosis from a 2013 colonoscopy. ?EGD was normal except for distal esophageal tortuosity.  Colonoscopy with pandiverticulosis and an 8 mm ascending colon pedunculated tubular adenoma.  I suspect that she had a slow motility constipation and perhaps rectocele or pelvic floor dysfunction, so a gynecologic evaluation was recommended. ?She also has a chronic anemia with very low iron studies, last saw hematology in November 2022 and is maintained on oral iron.  According to the notes, this anemia is believed to be at least in part due to hematuria for which she has been evaluated by urology. ? ?Samantha Stevenson has had ongoing trouble with constipation since I last saw her.  She describes about 2 months of left lower quadrant pain and feeling of a blockage after eating, symptoms are worse when she tries to have a bowel movement.  She feels incompletely evacuated after BMs, bloated and uncomfortable as well. ?The lower abdominal pain is sometimes bandlike and even rising into the upper abdomen with bowel movements. ?When asked if she had seen gynecology before because we had discussed evaluation for possible rectocele/pelvic floor dysfunction, she said it had been years, not since I last saw her.  Samantha Stevenson reported having "prolapse" and her gynecologist said to contact her when she felt ready to consider surgery. ?As before, she also feels that her bladder is incompletely evacuating. ?ROS: ?Cardiovascular:  no chest pain ?Respiratory: no dyspnea ?Remainder of systems negative except as above ?The patient's Past Medical, Family and Social History were reviewed and are on file in the EMR. ?Past Medical History:  ?Diagnosis Date  ? Allergy   ? Anemia   ? Arthritis   ?  "qwhere" (07/29/2018)  ? Atrial fibrillation ([place])   ? CHF (congestive heart failure) ([place])   ? Clotting disorder ([place])   ? Degenerative arthritis of hip   ? s/p L THR 12/2012  ? Dyspnea   ? since surgery in August 2019- "when I get worked up and walk a short distance"  ? Dysrhythmia   ? History of blood transfusion 05/2018  ? "related to OR"  ? Hyperlipidemia   ? Hypertension   ? Intramural hematoma of thoracic aorta ([place]) 06/13/2018  ? Migraines   ? mIgraines- none since blood pressure and lipids are under control  ? Myocardial infarction [facility]) 2000  ? due to atrial fib  ? Neuromuscular disorder ([place])   ? pinched nerve- left side of neck  ? Pulmonary emboli ([place]) 12/2012 dx  ? post op (L THR), anticoag x 91mo ? ? ?Objective: ? ?Med list reviewed ? ?Current Outpatient Medications:  ?  acetaminophen (TYLENOL) 500 MG tablet, Take 1,000 mg by mouth every 8 (eight) hours as needed for moderate pain., Disp: , Rfl:  ?  aspirin EC 81 MG tablet, Take 81 mg by mouth at bedtime., Disp: , Rfl:  ?  B Complex Vitamins (VITAMIN B COMPLEX) TABS, Take 1 tablet by mouth daily., Disp: , Rfl:  ?  clindamycin (CLEOCIN T) 1 % lotion, Apply 1 application. topically as needed., Disp: , Rfl:  ?  diltiazem (DILT-XR) 240 MG 24 hr capsule, TAKE 1 CAPSULE(240 MG) BY MOUTH EVERY MORNING, Disp: 90 capsule, Rfl: 1 ?  ferrous sulfate 325 (65 FE) MG tablet, Take 325 mg by  mouth daily with breakfast., Disp: , Rfl:  ?  furosemide (LASIX) 20 MG tablet, Take 1 tablet (20 mg total) by mouth at bedtime., Disp: 30 tablet, Rfl: 0 ?  linaclotide (LINZESS) 145 MCG CAPS capsule, Take 1 capsule (145 mcg total) by mouth daily before breakfast., Disp: 8 capsule, Rfl: 0 ?  linaclotide (LINZESS) 72 MCG capsule, Take 1 capsule (72 mcg total) by mouth daily before breakfast., Disp: 8 capsule, Rfl: 0 ?  METAMUCIL FIBER PO, Take 1 Dose by mouth in the morning and at bedtime., Disp: , Rfl:  ?  metroNIDAZOLE (METROCREAM) 0.75 % cream, Apply 1 application. topically as  needed., Disp: , Rfl:  ?  Multiple Vitamin (MULTIVITAMIN) tablet, Take 2 tablets by mouth daily. Gummies, Disp: , Rfl:  ?  polyethylene glycol powder (GLYCOLAX/MIRALAX) 17 GM/SCOOP powder, Take 17 g by mouth daily., Disp: 3350 g, Rfl: 11 ?  rosuvastatin (CRESTOR) 20 MG tablet, TAKE 1 TABLET BY MOUTH EVERY DAY overdue for Annual appt w/labs must see provider for future refills, Disp: 30 tablet, Rfl: 0 ? ? ?Vital signs in last 24 hrs: ?Vitals:  ? 03/08/22 1325  ?BP: 138/88  ?Pulse: 85  ? ?Wt Readings from Last 3 Encounters:  ?03/08/22 169 lb 8 oz (76.9 kg)  ?09/09/21 170 lb 9.6 oz (77.4 kg)  ?07/21/21 170 lb 12.8 oz (77.5 kg)  ?  ?Physical Exam ? ?Well-appearing ?HEENT: sclera anicteric, oral mucosa moist without lesions ?Neck: supple, no thyromegaly, JVD or lymphadenopathy ?Cardiac: RRR without murmurs, S1S2 heard, no peripheral edema ?Pulm: clear to auscultation bilaterally, normal RR and effort noted ?Abdomen: soft, mild generalized tenderness, with active bowel sounds. No guarding or palpable hepatosplenomegaly.  No distention ?Skin; warm and dry, no jaundice or rash ? ?Labs: ? ? ?___________________________________________ ?Radiologic studies: ? ? ?____________________________________________ ?Other: ? ? ?_____________________________________________ ?Assessment & Plan  ?Assessment: ?Encounter Diagnoses  ?Name Primary?  ? Generalized abdominal pain Yes  ? Chronic constipation   ? Abdominal bloating   ? ? ?Generalized abdominal pain chronic constipation and bloating, similar to when I had seen her before but worse in last couple of months. ?Probably combination of slow colonic motility, possible pelvic floor dysfunction and may be rectocele based on her history as well as tortuous colonic anatomy from severe diverticulosis. ?I do not think she needs another colonoscopy at this point.  I want to rule out other intra-abdominal pathology since her symptoms are lately worse. ?Plan: ?CT abdomen and pelvis with oral and  IV contrast ? ?Start Linzess 72 mcg once daily for a week, increasing to 145 mcg daily if needed.  A week of samples of both doses were given, she will let us know how that works and we can send a prescription if needed. ? ?I recommended she see her gynecologist, and I will copy my note to them.  She needs to be evaluated for possible rectocele. ? ?Return to office scheduled ? ?30 minutes were spent on this encounter (including chart review, history/exam, counseling/coordination of care, and documentation) > 50% of that time was spent on counseling and coordination of care.  ? ?Nelida Meuse III ? ?

## 2022-03-16 DIAGNOSIS — Z01419 Encounter for gynecological examination (general) (routine) without abnormal findings: Secondary | ICD-10-CM | POA: Diagnosis not present

## 2022-03-16 DIAGNOSIS — N3941 Urge incontinence: Secondary | ICD-10-CM | POA: Diagnosis not present

## 2022-03-16 DIAGNOSIS — R3982 Chronic bladder pain: Secondary | ICD-10-CM | POA: Diagnosis not present

## 2022-03-16 DIAGNOSIS — N8189 Other female genital prolapse: Secondary | ICD-10-CM | POA: Diagnosis not present

## 2022-03-17 ENCOUNTER — Ambulatory Visit (HOSPITAL_COMMUNITY)
Admission: RE | Admit: 2022-03-17 | Discharge: 2022-03-17 | Disposition: A | Discharge status: Home / Self Care | Payer: Medicare Other | Source: Ambulatory Visit | Attending: Gastroenterology | Admitting: Gastroenterology

## 2022-03-17 ENCOUNTER — Encounter (HOSPITAL_COMMUNITY): Payer: Self-pay

## 2022-03-17 DIAGNOSIS — K5909 Other constipation: Secondary | ICD-10-CM | POA: Diagnosis not present

## 2022-03-17 DIAGNOSIS — R1084 Generalized abdominal pain: Secondary | ICD-10-CM | POA: Insufficient documentation

## 2022-03-17 DIAGNOSIS — K769 Liver disease, unspecified: Secondary | ICD-10-CM | POA: Diagnosis not present

## 2022-03-17 DIAGNOSIS — K5730 Diverticulosis of large intestine without perforation or abscess without bleeding: Secondary | ICD-10-CM | POA: Diagnosis not present

## 2022-03-17 DIAGNOSIS — R140 Abdominal distension (gaseous): Secondary | ICD-10-CM | POA: Diagnosis not present

## 2022-03-17 MED ORDER — IOHEXOL 300 MG/ML  SOLN
100.0000 mL | Freq: Once | INTRAMUSCULAR | Status: AC | PRN
  Administered 2022-03-17: 100 mL via INTRAVENOUS

## 2022-03-17 MED ORDER — SODIUM CHLORIDE (PF) 0.9 % IJ SOLN
INTRAMUSCULAR | Status: AC
  Filled 2022-03-17: qty 50

## 2022-03-22 ENCOUNTER — Other Ambulatory Visit: Payer: Self-pay

## 2022-03-22 MED ORDER — LINACLOTIDE 72 MCG PO CAPS: 72.0000 ug | ORAL_CAPSULE | Freq: Every day | ORAL | 2 refills | Status: DC

## 2022-04-03 ENCOUNTER — Other Ambulatory Visit: Payer: Self-pay | Admitting: Internal Medicine

## 2022-04-03 DIAGNOSIS — I480 Paroxysmal atrial fibrillation: Secondary | ICD-10-CM

## 2022-04-17 ENCOUNTER — Ambulatory Visit (INDEPENDENT_AMBULATORY_CARE_PROVIDER_SITE_OTHER): Payer: Medicare Other | Admitting: Internal Medicine

## 2022-04-17 ENCOUNTER — Encounter: Payer: Self-pay | Admitting: Internal Medicine

## 2022-04-17 VITALS — BP 130/64 | HR 64 | Resp 18 | Ht 62.5 in | Wt 171.8 lb

## 2022-04-17 DIAGNOSIS — E785 Hyperlipidemia, unspecified: Secondary | ICD-10-CM | POA: Diagnosis not present

## 2022-04-17 DIAGNOSIS — E538 Deficiency of other specified B group vitamins: Secondary | ICD-10-CM | POA: Diagnosis not present

## 2022-04-17 DIAGNOSIS — R5383 Other fatigue: Secondary | ICD-10-CM

## 2022-04-17 DIAGNOSIS — M79601 Pain in right arm: Secondary | ICD-10-CM | POA: Diagnosis not present

## 2022-04-17 DIAGNOSIS — D649 Anemia, unspecified: Secondary | ICD-10-CM | POA: Diagnosis not present

## 2022-04-17 DIAGNOSIS — E559 Vitamin D deficiency, unspecified: Secondary | ICD-10-CM | POA: Diagnosis not present

## 2022-04-17 DIAGNOSIS — I10 Essential (primary) hypertension: Secondary | ICD-10-CM | POA: Diagnosis not present

## 2022-04-17 DIAGNOSIS — M79602 Pain in left arm: Secondary | ICD-10-CM

## 2022-04-17 DIAGNOSIS — I5032 Chronic diastolic (congestive) heart failure: Secondary | ICD-10-CM

## 2022-04-17 DIAGNOSIS — I480 Paroxysmal atrial fibrillation: Secondary | ICD-10-CM | POA: Diagnosis not present

## 2022-04-17 LAB — COMPREHENSIVE METABOLIC PANEL
ALT: 11 U/L (ref 0–35)
AST: 17 U/L (ref 0–37)
Albumin: 3.9 g/dL (ref 3.5–5.2)
Alkaline Phosphatase: 103 U/L (ref 39–117)
BUN: 16 mg/dL (ref 6–23)
CO2: 30 mEq/L (ref 19–32)
Calcium: 9.3 mg/dL (ref 8.4–10.5)
Chloride: 103 mEq/L (ref 96–112)
Creatinine, Ser: 1.02 mg/dL (ref 0.40–1.20)
GFR: 56.21 mL/min — ABNORMAL LOW (ref >60.00)
Glucose, Bld: 100 mg/dL — ABNORMAL HIGH (ref 70–99)
Potassium: 4.2 mEq/L (ref 3.5–5.1)
Sodium: 140 mEq/L (ref 135–145)
Total Bilirubin: 0.7 mg/dL (ref 0.2–1.2)
Total Protein: 7 g/dL (ref 6.0–8.3)

## 2022-04-17 LAB — LIPID PANEL
Cholesterol: 177 mg/dL (ref 0–200)
HDL: 53 mg/dL (ref >39.00)
LDL Cholesterol: 106 mg/dL — ABNORMAL HIGH (ref 0–99)
NonHDL: 123.75
Total CHOL/HDL Ratio: 3
Triglycerides: 89 mg/dL (ref 0.0–149.0)
VLDL: 17.8 mg/dL (ref 0.0–40.0)

## 2022-04-17 LAB — CBC
HCT: 33.6 % — ABNORMAL LOW (ref 36.0–46.0)
Hemoglobin: 10.8 g/dL — ABNORMAL LOW (ref 12.0–15.0)
MCHC: 32.1 g/dL (ref 30.0–36.0)
MCV: 86.8 fl (ref 78.0–100.0)
Platelets: 249 10*3/uL (ref 150.0–400.0)
RBC: 3.87 Mil/uL (ref 3.87–5.11)
RDW: 17.1 % — ABNORMAL HIGH (ref 11.5–15.5)
WBC: 5.3 10*3/uL (ref 4.0–10.5)

## 2022-04-17 LAB — VITAMIN D 25 HYDROXY (VIT D DEFICIENCY, FRACTURES): VITD: 39.77 ng/mL (ref 30.00–100.00)

## 2022-04-17 LAB — FERRITIN: Ferritin: 19.3 ng/mL (ref 10.0–291.0)

## 2022-04-17 LAB — VITAMIN B12: Vitamin B-12: 557 pg/mL (ref 211–911)

## 2022-04-17 MED ORDER — ROSUVASTATIN CALCIUM 20 MG PO TABS: ORAL_TABLET | ORAL | 3 refills | Status: DC

## 2022-04-17 NOTE — Progress Notes (Unsigned)
   Subjective:   Patient ID: Samantha Stevenson, female    DOB: [birth year], 71 y.o.   MRN: [id number]  HPI The patient is a 71 YO female coming in for arm pain and follow up.   Review of Systems  Constitutional: Negative.   HENT: Negative.    Eyes: Negative.   Respiratory:  Negative for cough, chest tightness and shortness of breath.   Cardiovascular:  Negative for chest pain, palpitations and leg swelling.  Gastrointestinal:  Negative for abdominal distention, abdominal pain, constipation, diarrhea, nausea and vomiting.  Musculoskeletal:  Positive for arthralgias and myalgias.  Skin: Negative.   Neurological: Negative.   Psychiatric/Behavioral: Negative.      Objective:  Physical Exam Constitutional:      Appearance: She is well-developed.  HENT:     Head: Normocephalic and atraumatic.  Cardiovascular:     Rate and Rhythm: Normal rate and regular rhythm.  Pulmonary:     Effort: Pulmonary effort is normal. No respiratory distress.     Breath sounds: Normal breath sounds. No wheezing or rales.  Abdominal:     General: Bowel sounds are normal. There is no distension.     Palpations: Abdomen is soft.     Tenderness: There is no abdominal tenderness. There is no rebound.  Musculoskeletal:        General: Tenderness present.     Cervical back: Normal range of motion.  Skin:    General: Skin is warm and dry.  Neurological:     Mental Status: She is alert and oriented to person, place, and time.     Coordination: Coordination normal.     Vitals:   04/17/22 1024  BP: 130/64  Pulse: 64  Resp: 18  SpO2: 98%  Weight: 171 lb 12.8 oz (77.9 kg)  Height: 5' 2.5" (1.588 m)    Assessment & Plan:

## 2022-04-18 ENCOUNTER — Other Ambulatory Visit: Payer: Self-pay

## 2022-04-18 DIAGNOSIS — I7100 Dissection of unspecified site of aorta: Secondary | ICD-10-CM

## 2022-04-19 DIAGNOSIS — R5383 Other fatigue: Secondary | ICD-10-CM | POA: Insufficient documentation

## 2022-04-19 DIAGNOSIS — M79601 Pain in right arm: Secondary | ICD-10-CM | POA: Insufficient documentation

## 2022-04-19 NOTE — Assessment & Plan Note (Signed)
Checking CBC for stability.  

## 2022-04-19 NOTE — Assessment & Plan Note (Signed)
Taking lasix 20 mg daily and this is controlling fluid well. No new SOB on exertion today.

## 2022-04-19 NOTE — Assessment & Plan Note (Signed)
Checking CBC, CMP, vitamin D, B12 and adjust as needed.

## 2022-04-19 NOTE — Progress Notes (Signed)
Samantha Stevenson Samantha Stevenson Phone: [phone]   Assessment and Plan:     1. Chronic pain of both shoulders 2. History of fusion of cervical spine 3. Chronic neck pain -Chronic with exacerbation, initial sports medicine visit - Bilateral shoulder pain x3 months with history of C-spine fusion C3-C7 - I believe patient's flare of bilateral shoulder pain is likely due to compensation with history of C-spine fusion - Patient cannot take NSAIDs due to cardiac history - Start Tylenol 500 to 1000 mg tablets 2-3 times a day for day-to-day pain relief - Patient prefers to not take p.o. steroids due to weight gain when she is tried this medication in the past.  Is agreeable to IM methylprednisone injection at today's office visit.  80 mg Depo-Medrol given IM and tolerated well. - Start HEP for neck, trap, shoulders - X-rays obtained in clinic.  My interpretation: No acute fracture, dislocation, or vertebral collapse.  History of C-spine fusion appears intact at C3-C7.  Moderate osteoarthritis of right AC joint. - methylPREDNISolone acetate (DEPO-MEDROL) injection 80 mg    Pertinent previous records reviewed include C-spine x-ray 02/02/2020   Follow Up: 3 to 4 weeks for reevaluation.  Could consider referral to physical therapy, though patient states she did not find this beneficial months ago with another provider.   Subjective:   I, Samantha Stevenson, am serving as a Education administrator for Doctor Samantha Stevenson  Chief Complaint: bilateral arm and shoulder pain   HPI:  04/26/2022 Patient is a 71 year old female complaining of bilateral arm and shoulder pain. Patient states that she has a pain that radiates down to her fingertips it comes and it goes sometimes it stays for a month and then sometimes its a little pain, sometimes it gets to a point she cant do ADLs, has been going on for about 3 months now no MOI, states she had bruising  on her bicep but no bruising now, has done PT but states that it didn't help , isnt able to sleep does radiate up into he neck, states it came on all of a sudden, no numbness or tingling, no locking clicking or popping , has been taking tylenol and has used tramadol and that seems to help with the pain  Relevant Historical Information: History of C3-C7 fusion  Additional pertinent review of systems negative.   Current Outpatient Medications:    acetaminophen (TYLENOL) 500 MG tablet, Take 1,000 mg by mouth every 8 (eight) hours as needed for moderate pain., Disp: , Rfl:    aspirin EC 81 MG tablet, Take 81 mg by mouth at bedtime., Disp: , Rfl:    B Complex Vitamins (VITAMIN B COMPLEX) TABS, Take 1 tablet by mouth daily., Disp: , Rfl:    clindamycin (CLEOCIN T) 1 % lotion, Apply 1 application. topically as needed., Disp: , Rfl:    diltiazem (DILACOR XR) 240 MG 24 hr capsule, TAKE ONE CAPSULE BY MOUTH EVERY MORNING, Disp: 90 capsule, Rfl: 1   ferrous sulfate 325 (65 FE) MG tablet, Take 325 mg by mouth daily with breakfast., Disp: , Rfl:    furosemide (LASIX) 20 MG tablet, Take 1 tablet (20 mg total) by mouth at bedtime., Disp: 30 tablet, Rfl: 0   linaclotide (LINZESS) 72 MCG capsule, Take 1 capsule (72 mcg total) by mouth daily before breakfast., Disp: 30 capsule, Rfl: 2   METAMUCIL FIBER PO, Take 1 Dose by mouth in the morning and  at bedtime., Disp: , Rfl:    metroNIDAZOLE (METROCREAM) 0.75 % cream, Apply 1 application. topically as needed., Disp: , Rfl:    Multiple Vitamin (MULTIVITAMIN) tablet, Take 2 tablets by mouth daily. Gummies, Disp: , Rfl:    polyethylene glycol powder (GLYCOLAX/MIRALAX) 17 GM/SCOOP powder, Take 17 g by mouth daily., Disp: 3350 g, Rfl: 11   rosuvastatin (CRESTOR) 20 MG tablet, TAKE 1 TABLET BY MOUTH EVERY DAY, Disp: 90 tablet, Rfl: 3  Current Facility-Administered Medications:    methylPREDNISolone acetate (DEPO-MEDROL) injection 80 mg, 80 mg, Intramuscular, Once,  Samantha Mac, DO   Objective:     Vitals:   04/26/22 1056  BP: 128/80  Pulse: 64  SpO2: 96%  Weight: 168 lb (76.2 kg)  Height: '5\' 2"'$  (1.575 m)      Body mass index is 30.73 kg/m.    Physical Exam:    Cervical Spine: Posture normal Skin: normal, intact  Neurological:   Strength:  Right  Left   Deltoid 5/5 5/5  Bicep 5/5  5/5  Tricep 5/5 5/5  Wrist Flexion 5/5 5/5  Wrist Extension 5/5 5/5  Grip 5/5 5/5  Finger Abduction 5/5 5/5   Sensation: intact to light touch in upper extremities bilaterally  Spurling's:  negative bilaterally Neck ROM: Significant reduction in bilateral rotation.  Moderate reduction in flexion and extension TTP: cervical paraspinal, thoracic paraspinal, trapezius  NTTP: cervical spinous processes,   Gen: Appears well, nad, nontoxic and pleasant Neuro:sensation intact, strength is 5/5 with df/pf/inv/ev, muscle tone wnl Skin: no suspicious lesion or defmority Psych: A&O, appropriate mood and affect  Bilateral shoulder: no deformity, swelling or muscle wasting No scapular winging FF 160, abd 150, int 20, ext 70 TTP right AC joint NTTP over the [place], clavicle, left ac, coracoid, biceps groove,   Equivocal special testing with all causing mild pain including neer, hawkings, empty can, subscap liftoff, speeds, obriens, crossarm Neg ant drawer, sulcus sign, apprehension     Electronically signed by:  [facility] 11:36 AM 04/26/22

## 2022-04-19 NOTE — Assessment & Plan Note (Signed)
BP at goal on diltiazem 240 mg daily and lasix 20 mg daily and checking CMP and CBC today. Adjust as needed.

## 2022-04-19 NOTE — Assessment & Plan Note (Signed)
Referral to sports medicine for assessment. Started about 1 month ago. She has some known cervical problems which could be related or shoulder etiology.

## 2022-04-19 NOTE — Assessment & Plan Note (Signed)
Checking CBC and CMP. Is taking diltiazem for rate control and sounds regular on exam. EKG not done.

## 2022-04-19 NOTE — Assessment & Plan Note (Signed)
Checking lipid panel and adjust crestor 20 mg daily as needed.  

## 2022-04-26 ENCOUNTER — Ambulatory Visit (INDEPENDENT_AMBULATORY_CARE_PROVIDER_SITE_OTHER): Payer: Medicare Other

## 2022-04-26 ENCOUNTER — Ambulatory Visit (INDEPENDENT_AMBULATORY_CARE_PROVIDER_SITE_OTHER): Payer: Medicare Other | Admitting: Sports Medicine

## 2022-04-26 ENCOUNTER — Encounter: Payer: Self-pay | Admitting: Internal Medicine

## 2022-04-26 VITALS — BP 128/80 | HR 64 | Ht 62.0 in | Wt 168.0 lb

## 2022-04-26 DIAGNOSIS — G8929 Other chronic pain: Secondary | ICD-10-CM | POA: Diagnosis not present

## 2022-04-26 DIAGNOSIS — Z981 Arthrodesis status: Secondary | ICD-10-CM | POA: Diagnosis not present

## 2022-04-26 DIAGNOSIS — M25511 Pain in right shoulder: Secondary | ICD-10-CM | POA: Diagnosis not present

## 2022-04-26 DIAGNOSIS — M25512 Pain in left shoulder: Secondary | ICD-10-CM

## 2022-04-26 DIAGNOSIS — M542 Cervicalgia: Secondary | ICD-10-CM

## 2022-04-26 MED ORDER — METHYLPREDNISOLONE ACETATE 40 MG/ML IJ SUSP: 80.0000 mg | Freq: Once | INTRAMUSCULAR | Status: AC

## 2022-04-26 NOTE — Patient Instructions (Addendum)
Good to see you  Neck trap and shoulder HEP  Tylenol [phone] mg 2-3 times a day for pain relief  3 week follow up

## 2022-04-27 ENCOUNTER — Ambulatory Visit (HOSPITAL_COMMUNITY)
Admission: RE | Admit: 2022-04-27 | Discharge: 2022-04-27 | Disposition: A | Discharge status: Home / Self Care | Payer: Medicare Other | Source: Ambulatory Visit | Attending: Vascular Surgery | Admitting: Vascular Surgery

## 2022-04-27 DIAGNOSIS — I7100 Dissection of unspecified site of aorta: Secondary | ICD-10-CM | POA: Insufficient documentation

## 2022-04-27 DIAGNOSIS — I700 Atherosclerosis of aorta: Secondary | ICD-10-CM | POA: Diagnosis not present

## 2022-04-27 DIAGNOSIS — I2510 Atherosclerotic heart disease of native coronary artery without angina pectoris: Secondary | ICD-10-CM | POA: Diagnosis not present

## 2022-04-27 DIAGNOSIS — I714 Abdominal aortic aneurysm, without rupture, unspecified: Secondary | ICD-10-CM | POA: Diagnosis not present

## 2022-04-27 MED ORDER — IOHEXOL 350 MG/ML SOLN
100.0000 mL | Freq: Once | INTRAVENOUS | Status: AC | PRN
  Administered 2022-04-27: 100 mL via INTRAVENOUS

## 2022-05-10 ENCOUNTER — Encounter: Payer: Self-pay | Admitting: Vascular Surgery

## 2022-05-10 ENCOUNTER — Ambulatory Visit (INDEPENDENT_AMBULATORY_CARE_PROVIDER_SITE_OTHER): Payer: Medicare Other | Admitting: Vascular Surgery

## 2022-05-10 VITALS — BP 130/71 | HR 72 | Temp 98.3°F | Resp 20 | Ht 62.0 in | Wt 169.0 lb

## 2022-05-10 DIAGNOSIS — I71012 Dissection of descending thoracic aorta: Secondary | ICD-10-CM | POA: Diagnosis not present

## 2022-05-10 NOTE — Progress Notes (Signed)
Patient ID: Samantha Stevenson, female   DOB: 05-Oct-1951, 71 y.o.   MRN: [id number]  Reason for Consult: Follow-up   Referred by Hoyt Koch, *  Subjective:     HPI:  Samantha Stevenson is a 71 y.o. female presents for 1 year follow-up after thoracic endografting with subclavian artery transposition of the common carotid artery with subsequent stent of the left common carotid artery.  She did have complication of lymphatic drainage was placed on a medium chain fatty acid diet now taking aspirin.  Denies any stroke, TIA or amaurosis.  She is overall progressing well without significant plaints and is hoping to begin traveling soon.  Past Medical History:  Diagnosis Date   Allergy    Anemia    Arthritis    "qwhere" (07/29/2018)   Atrial fibrillation (HCC)    CHF (congestive heart failure) (HCC)    Clotting disorder (HCC)    Degenerative arthritis of hip    s/p L THR 12/2012   Dyspnea    since surgery in August 2019- "when I get worked up and walk a short distance"   Dysrhythmia    History of blood transfusion 05/2018   "related to OR"   Hyperlipidemia    Hypertension    Intramural hematoma of thoracic aorta ([place]) 06/13/2018   Migraines    mIgraines- none since blood pressure and lipids are under control   Myocardial infarction ([place]) 2000   due to atrial fib   Neuromuscular disorder (HCC)    pinched nerve- left side of neck   Pulmonary emboli ([place]) 12/2012 dx   post op (L THR), anticoag x 89mo  Family History  Problem Relation Age of Onset   Heart disease Father    Diabetes Mother    Heart disease Mother        pacemaker   Heart attack Mother 971  Diabetes Sister    Alcohol abuse Other    Heart disease Other    Hyperlipidemia Other    Hypertension Other    Diabetes Other    Breast cancer Other    Colon cancer Neg Hx    Esophageal cancer Neg Hx    Stomach cancer Neg Hx    Rectal cancer Neg Hx    Past Surgical History:  Procedure Laterality Date    ABDOMINAL HYSTERECTOMY     "w/1 tube"   ANTERIOR CERVICAL DECOMPRESSION/DISCECTOMY FUSION 4 LEVELS N/A 02/02/2020   Procedure: CERVICAL THREE-FOUR, CERVICAL FOUR-FIVE, CERVICAL FIVE-SIX, CERVICAL SIX-SEVEN ANTERIOR CERVICAL DECOMPRESSION/DISCECTOMY FUSION;  Surgeon: PEarnie Larsson MD;  [facility]  Service: Neurosurgery;  Laterality: N/A;   ARTERY REPAIR Left 08/15/2018   Procedure: LEFT BRACHIAL ARTERY EXPLORATION;  Surgeon: CWaynetta Sandy MD;  [facility]  Service: Vascular;  Laterality: Left;   Breast Ultrasound Left 04/16/2013   Done @ breast center Impression: no malignancy appearance noted on the screen study is consistent with a summation shadow   BUNIONECTOMY Left    CARDIAC CATHETERIZATION     CAROTID-SUBCLAVIAN BYPASS GRAFT Left 06/13/2018   Procedure: BYPASS GRAFT CAROTID-SUBCLAVIAN;  Surgeon: CWaynetta Sandy MD;  [facility]  Service: Vascular;  Laterality: Left;   COLONOSCOPY     CYSTOSCOPY WITH RETROGRADE PYELOGRAM, URETEROSCOPY AND STENT PLACEMENT Bilateral 11/02/2020   Procedure: DIAGNOSTIC CYSTOSCOPY WITH RETROGRADE PYELOGRAM,BILATERAL DIAGNOSTIC URETEROSCOPY  AND BILATERAL STENT PLACEMENT;  Surgeon: PRobley Fries MD;  [facility]  Service: Urology;  Laterality: Bilateral;  90 MINS   DG TUMB  RIGHT HAND Right    cyst removal   IR THORACENTESIS ASP PLEURAL SPACE W/IMG GUIDE  05/31/2018   JOINT REPLACEMENT     KNEE ARTHROSCOPY Left 12/16/2020   Procedure: LEFT KNEE ARTHROSCOPY WITH PARTIAL LATERAL MENISCECTOMY;  Surgeon: Mcarthur Rossetti, MD;  Location: [facility];  Service: Orthopedics;  Laterality: Left;   THORACIC AORTIC ENDOVASCULAR STENT GRAFT Left 06/13/2018   Procedure: LEFT  SUBCLAVIAN TO CAROTID ARTERY TRANSPOSITION; THORACIC AORTIC ENDOVASCULAR STENT GRAFT using GORE CONFORMABLE THORACIC STENT GRAFT AND GORE TAG THORACIC ENDOPROSTHESIS; STENT LEFT COMMON CAROTID ARTERY, RIGHT  COMMON-FEMORAL ENDARTERECTOMY WITH PATCH ANGIOPLASTY;  Surgeon: Waynetta Sandy, MD;  Location: [facility];  Service: Vascular;  Laterality: Left;   TONSILLECTOMY     TOTAL HIP ARTHROPLASTY Left 01/03/2013   Procedure: TOTAL HIP ARTHROPLASTY ANTERIOR APPROACH;  Surgeon: Mcarthur Rossetti, MD;  Location[facility];  Service: Orthopedics;  Laterality: Left;  Left Total Hip Arthroplasty, Anterior Approach   TUBAL LIGATION     VAGINA RECONSTRUCTION SURGERY  1966   vagina had closed up when 71 years old   WOUND EXPLORATION Left 06/16/2018   Procedure: LEFT NECK WOUND EXPLORATION, CHEST TUBE INSERTION;  Surgeon: Waynetta Sandy, MD;  Location: [facility];  Service: Vascular;  Laterality: Left;    Short Social History:  Social History   Tobacco Use   Smoking status: Never   Smokeless tobacco: Never  Substance Use Topics   Alcohol use: Not Currently    Allergies  Allergen Reactions   Penicillins Swelling and Other (See Comments)    Severe swelling PATIENT HAS HAD A PCN REACTION WITH IMMEDIATE RASH, FACIAL/TONGUE/THROAT SWELLING, SOB, OR LIGHTHEADEDNESS WITH HYPOTENSION:  #  #  YES  #  #  Has patient had a PCN reaction causing severe rash involving mucus membranes or skin necrosis: No PATIENT HAS HAD A PCN REACTION THAT REQUIRED HOSPITALIZATION:  #  #  YES  #  #  Has patient had a PCN reaction occurring within the last 10 years: No   Shellfish Allergy Anaphylaxis   Latex Hives and Rash   Banana Nausea Only   Kiwi Extract    Lactose Intolerance (Gi) Diarrhea and Nausea And Vomiting   Oxycodone Other (See Comments)    Pt stated, "Made me feel really drowsy" Tolerating as of 02/16/20 after neck surgery   Asa [Aspirin] Nausea And Vomiting    Makes stomach upset Asprin 325   Celebrex [Celecoxib] Nausea And Vomiting    GI upset   Nsaids Rash    Current Outpatient Medications  Medication Sig Dispense Refill   acetaminophen (TYLENOL) 500 MG tablet Take 1,000 mg by mouth every  8 (eight) hours as needed for moderate pain.     aspirin EC 81 MG tablet Take 81 mg by mouth at bedtime.     B Complex Vitamins (VITAMIN B COMPLEX) TABS Take 1 tablet by mouth daily.     clindamycin (CLEOCIN T) 1 % lotion Apply 1 application. topically as needed.     diltiazem (DILACOR XR) 240 MG 24 hr capsule TAKE ONE CAPSULE BY MOUTH EVERY MORNING 90 capsule 1   ferrous sulfate 325 (65 FE) MG tablet Take 325 mg by mouth daily with breakfast.     furosemide (LASIX) 20 MG tablet Take 1 tablet (20 mg total) by mouth at bedtime. 30 tablet 0   linaclotide (LINZESS) 72 MCG capsule Take 1 capsule (72 mcg total) by mouth daily before breakfast. 30 capsule 2  METAMUCIL FIBER PO Take 1 Dose by mouth in the morning and at bedtime.     metroNIDAZOLE (METROCREAM) 0.75 % cream Apply 1 application. topically as needed.     Multiple Vitamin (MULTIVITAMIN) tablet Take 2 tablets by mouth daily. Gummies     polyethylene glycol powder (GLYCOLAX/MIRALAX) 17 GM/SCOOP powder Take 17 g by mouth daily. 3350 g 11   rosuvastatin (CRESTOR) 20 MG tablet TAKE 1 TABLET BY MOUTH EVERY DAY 90 tablet 3   Current Facility-Administered Medications  Medication Dose Route Frequency Provider Last Rate Last Admin   methylPREDNISolone acetate (DEPO-MEDROL) injection 80 mg  80 mg Intramuscular Once [name], DO        Review of Systems  Constitutional:  Constitutional negative. HENT: HENT negative.  Eyes: Eyes negative.  Respiratory: Positive for shortness of breath.  Cardiovascular: Positive for leg swelling.  Musculoskeletal: Musculoskeletal negative.  Skin: Skin negative.  Neurological: Positive for focal weakness.  Hematologic: Hematologic/lymphatic negative.  Psychiatric: Psychiatric negative.        Objective:  Objective   Vitals:   05/10/22 1517  BP: 130/71  Pulse: 72  Resp: 20  Temp: 98.3 F (36.8 C)  SpO2: 95%  Weight: 169 lb (76.7 kg)  Height: '5\' 2"'$  (1.575 m)   Body mass index is 30.91  kg/m.  Physical Exam HENT:     Head: Normocephalic.     Nose: Nose normal.  Eyes:     Pupils: Pupils are equal, round, and reactive to light.  Neck:     Vascular: No carotid bruit.     Comments: Well-healed supraclavicular incision on the left Cardiovascular:     Pulses: Normal pulses.     Heart sounds: Normal heart sounds.  Pulmonary:     Effort: Pulmonary effort is normal.  Abdominal:     General: Abdomen is flat.     Palpations: Abdomen is soft.  Musculoskeletal:        General: Normal range of motion.     Cervical back: Neck supple.     Right lower leg: No edema.     Left lower leg: No edema.  Skin:    General: Skin is warm.     Capillary Refill: Capillary refill takes less than 2 seconds.  Neurological:     General: No focal deficit present.     Mental Status: She is alert.  Psychiatric:        Mood and Affect: Mood normal.     Data: CTA IMPRESSION: 1. Stable TEVAR of the aortic isthmus and descending thoracic aorta without evidence of residual dissection or complication. 2. Patent left common carotid artery stent and left carotid subclavian bypass without evidence of complication. 3. Slight interval progression of cardiomegaly. 4. Continued progression of visceral abdominal aortic aneurysm now measuring up to 3.8 cm in maximal diameter compared to 3.3-3.5 cm previously. Recommend follow-up every 2 years. This recommendation follows ACR consensus guidelines: White Paper of the ACR Incidental Findings Committee II on Vascular Findings. J Am Coll Radiol 2013; 10:789-794. 5. Additional ancillary findings as above without significant interval change.     Assessment/Plan:      71 year old female with above-noted complex thoracic endovascular repair for dissection in the past.  She is now healed well CT scan is satisfactory today.  She will follow-up in 1 year with repeat CT and also with carotid duplex given common carotid artery stenting at the time of  surgery.  After that we can likely see her in 65 to 46  months in follow-up given that she will be 5 years out from her initial surgery.  We discussed the signs and symptoms of stroke and complication from her thoracic endovascular repair as well as her very small abdominal aortic aneurysm and she demonstrates good understanding and will follow up in 1 year as noted above.  Waynetta Sandy MD [facility]

## 2022-05-16 NOTE — Progress Notes (Unsigned)
    Benito Mccreedy D.[place] Phone: [phone]   Assessment and Plan:     There are no diagnoses linked to this encounter.  ***   Pertinent previous records reviewed include ***   Follow Up: ***     Subjective:   I, Samantha Stevenson, am serving as a Education administrator for Doctor [name]   Chief Complaint: bilateral arm and shoulder pain    HPI:  04/26/2022 Patient is a 71 year old female complaining of bilateral arm and shoulder pain. Patient states that she has a pain that radiates down to her fingertips it comes and it goes sometimes it stays for a month and then sometimes its a little pain, sometimes it gets to a point she cant do ADLs, has been going on for about 3 months now no MOI, states she had bruising on her bicep but no bruising now, has done PT but states that it didn't help , isnt able to sleep does radiate up into he neck, states it came on all of a sudden, no numbness or tingling, no locking clicking or popping , has been taking tylenol and has used tramadol and that seems to help with the pain   05/17/2022 Patient states   Relevant Historical Information: History of C3-C7 fusion  Additional pertinent review of systems negative.   Current Outpatient Medications:    acetaminophen (TYLENOL) 500 MG tablet, Take 1,000 mg by mouth every 8 (eight) hours as needed for moderate pain., Disp: , Rfl:    aspirin EC 81 MG tablet, Take 81 mg by mouth at bedtime., Disp: , Rfl:    B Complex Vitamins (VITAMIN B COMPLEX) TABS, Take 1 tablet by mouth daily., Disp: , Rfl:    clindamycin (CLEOCIN T) 1 % lotion, Apply 1 application. topically as needed., Disp: , Rfl:    diltiazem (DILACOR XR) 240 MG 24 hr capsule, TAKE ONE CAPSULE BY MOUTH EVERY MORNING, Disp: 90 capsule, Rfl: 1   ferrous sulfate 325 (65 FE) MG tablet, Take 325 mg by mouth daily with breakfast., Disp: , Rfl:    furosemide (LASIX) 20 MG tablet, Take  1 tablet (20 mg total) by mouth at bedtime., Disp: 30 tablet, Rfl: 0   linaclotide (LINZESS) 72 MCG capsule, Take 1 capsule (72 mcg total) by mouth daily before breakfast., Disp: 30 capsule, Rfl: 2   METAMUCIL FIBER PO, Take 1 Dose by mouth in the morning and at bedtime., Disp: , Rfl:    metroNIDAZOLE (METROCREAM) 0.75 % cream, Apply 1 application. topically as needed., Disp: , Rfl:    Multiple Vitamin (MULTIVITAMIN) tablet, Take 2 tablets by mouth daily. Gummies, Disp: , Rfl:    polyethylene glycol powder (GLYCOLAX/MIRALAX) 17 GM/SCOOP powder, Take 17 g by mouth daily., Disp: 3350 g, Rfl: 11   rosuvastatin (CRESTOR) 20 MG tablet, TAKE 1 TABLET BY MOUTH EVERY DAY, Disp: 90 tablet, Rfl: 3  Current Facility-Administered Medications:    methylPREDNISolone acetate (DEPO-MEDROL) injection 80 mg, 80 mg, Intramuscular, Once, [name], DO   Objective:     There were no vitals filed for this visit.    There is no height or weight on file to calculate BMI.    Physical Exam:    ***   Electronically signed by:  Benito Mccreedy D.[facility] 7:51 AM 05/16/22

## 2022-05-17 ENCOUNTER — Ambulatory Visit (INDEPENDENT_AMBULATORY_CARE_PROVIDER_SITE_OTHER): Payer: Medicare Other | Admitting: Sports Medicine

## 2022-05-17 VITALS — BP 138/84 | HR 67 | Ht 62.0 in | Wt 169.0 lb

## 2022-05-17 DIAGNOSIS — M25511 Pain in right shoulder: Secondary | ICD-10-CM | POA: Diagnosis not present

## 2022-05-17 DIAGNOSIS — M542 Cervicalgia: Secondary | ICD-10-CM | POA: Diagnosis not present

## 2022-05-17 DIAGNOSIS — M25512 Pain in left shoulder: Secondary | ICD-10-CM | POA: Diagnosis not present

## 2022-05-17 DIAGNOSIS — Z981 Arthrodesis status: Secondary | ICD-10-CM

## 2022-05-17 DIAGNOSIS — G8929 Other chronic pain: Secondary | ICD-10-CM | POA: Diagnosis not present

## 2022-05-17 NOTE — Patient Instructions (Signed)
Good to see you   

## 2022-06-15 DIAGNOSIS — N8189 Other female genital prolapse: Secondary | ICD-10-CM | POA: Diagnosis not present

## 2022-06-15 DIAGNOSIS — R350 Frequency of micturition: Secondary | ICD-10-CM | POA: Diagnosis not present

## 2022-07-04 DIAGNOSIS — H25812 Combined forms of age-related cataract, left eye: Secondary | ICD-10-CM | POA: Diagnosis not present

## 2022-07-04 DIAGNOSIS — H2511 Age-related nuclear cataract, right eye: Secondary | ICD-10-CM | POA: Diagnosis not present

## 2022-07-05 ENCOUNTER — Telehealth: Payer: Self-pay | Admitting: *Deleted

## 2022-07-05 NOTE — Telephone Encounter (Signed)
   Pre-operative Risk Assessment    Patient Name: Samantha Stevenson  DOB: Mar 04, 1951 MRN: [id number]      Request for Surgical Clearance    Procedure:   CATARACT EXTRACTION BY PE, IOL-LEFT EYE THE RIGHT EYE  Date of Surgery:  Clearance 07/19/22                                 Surgeon:  DR. Vicente Males DeLMONTE Surgeon's Group or Practice Name:  [facility] Phone number:  [phone] [phone] Fax number:  [phone]   Type of Clearance Requested:   - Medical ;NO MEDICATIONS LISTED AS NEEDING TO BE HELD   Type of Anesthesia:   IV SEDATION   Additional requests/questions:    Samantha Stevenson   07/05/2022, 3:41 PM

## 2022-07-06 ENCOUNTER — Other Ambulatory Visit: Payer: Self-pay | Admitting: Gastroenterology

## 2022-07-06 NOTE — Telephone Encounter (Signed)
   Name: Samantha Stevenson  DOB: [birth year]  MRN: [id number]  Primary Cardiologist: [name], MD  Chart reviewed as part of pre-operative protocol coverage. Because of Meghen Swim's past medical history and time since last visit, she will require a follow-up in-office visit in order to better assess preoperative cardiovascular risk.  Last OV >1 yr  Though cataracts are thought to be low risk procedures and typically do not require any cardiac clearance, the patient has not been seen in over a year so patient requires OV per protocol.  Pre-op covering staff: - Please schedule appointment and call patient to inform them. If patient already had an upcoming appointment within acceptable timeframe, please add "pre-op clearance" to the appointment notes so provider is aware. - Please contact requesting surgeon's office via preferred method (i.e, phone, fax) to inform them of need for appointment prior to surgery.  Does not need to hold meds per intake.  Charlie Pitter, PA-C  07/06/2022, 5:08 PM

## 2022-07-07 NOTE — Telephone Encounter (Signed)
I will send clearance as this is a low risk procedure but patient should schedule follow-up appointment for a convenient date in order to maintain consistent cardiology care.

## 2022-07-07 NOTE — Telephone Encounter (Signed)
   Patient Name: Paulyne Mooty  DOB: 10-12-1951 MRN: [id number]  Primary Cardiologist: [name], MD  Chart reviewed as part of pre-operative protocol coverage. Cataract extractions are recognized in guidelines as low risk surgeries that do not typically require specific preoperative testing or holding of blood thinner therapy. Therefore, given past medical history and time since last visit, based on ACC/AHA guidelines, Valari Taylor would be at acceptable risk for the planned procedure without further cardiovascular testing.   I will route this recommendation to the requesting party via Epic fax function and remove from pre-op pool.  Please call with questions.  Emmaline Life, NP-C  07/07/2022, 4:25 PM [address] Office [phone] Fax [phone]

## 2022-07-10 NOTE — Telephone Encounter (Signed)
Last ov notes from Dr. Curt Bears faxed to requesting office per their request.

## 2022-07-10 NOTE — Telephone Encounter (Signed)
Caller wanted to get a copy of her most recent office visit faxed for their records.  Fax# [phone].

## 2022-07-10 NOTE — Telephone Encounter (Signed)
Kinsey from Dr. Sharen Counter office called this morning stating she got the notes about clearance, but also saw about the pt needed an appt. Kinsey. Was wanting to be sure that she read the notes correctly the pt has been cleared but knows she needs to f/u with Dr. Curt Bears for her yrly f/u. I assured Esmond Camper that is correct. The pt has been cleared but knows she needs to f/u with Dr. Curt Bears as well. Esmond Camper thanked me for the help.

## 2022-07-12 ENCOUNTER — Telehealth: Payer: Self-pay | Admitting: Internal Medicine

## 2022-07-12 NOTE — Telephone Encounter (Signed)
Left message for patient to call back to schedule Medicare Annual Wellness Visit   Last AWV  07/21/21  Please schedule at anytime with [facility]-Nurse Health Advisor if patient calls the office back.     Any questions, please call me at [phone]  

## 2022-07-19 DIAGNOSIS — H25812 Combined forms of age-related cataract, left eye: Secondary | ICD-10-CM | POA: Diagnosis not present

## 2022-07-19 DIAGNOSIS — H269 Unspecified cataract: Secondary | ICD-10-CM | POA: Diagnosis not present

## 2022-08-04 DIAGNOSIS — H2511 Age-related nuclear cataract, right eye: Secondary | ICD-10-CM | POA: Diagnosis not present

## 2022-08-04 DIAGNOSIS — H269 Unspecified cataract: Secondary | ICD-10-CM | POA: Diagnosis not present

## 2022-08-04 DIAGNOSIS — H25811 Combined forms of age-related cataract, right eye: Secondary | ICD-10-CM | POA: Diagnosis not present

## 2022-08-06 NOTE — Progress Notes (Unsigned)
Cardiology Office Note Date:  08/08/2022  Patient ID:  Samantha Stevenson, Samantha Stevenson 21-Mar-1951, MRN [id number] PCP:  Hoyt Koch, MD  Electrophysiologist:  Dr. Curt Bears   Chief Complaint:   annual visit  History of Present Illness: Samantha Stevenson is a 71 y.o. 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   She saw Dr. Willeen Niece 2017, at that time was referred to him for her Afib.  He mentions though having no records to review that actually showed her AFib, and that attempts to get them would be made, that she may benefit from a/c in the future if she does have AF.  She was c/o of some SOB and planned for an echo.  Echo then noted LVEF 55%, no WMA, no significant VHD, findings of elevated LVEDP and LA filling pressure.  She was not seen again until May 2019 by [facility], seeing [name], PA for surgical clearance prior to a thumb surgery.  She noted records obtained 2014 had EKGs both SR and pt declined a/c.  She was treated 6 mo in 2014 for PE. At the time of her visit the pt felt well, had visited [place] walked much without difficulty, had chrinic LE edema managed with support stockings and lasix.  Palpitations controlled with her dilt and dig well, though mentiuoned when not taking them would have Afib. She was cleared for her surgery, also noted the patient had become agreeable for a/c and planned to start Xarelto 2 days post op and have Mayer follow up afterwards.  August 2019, she developed CP, went to [facility], given h/o PE had CT chest done, noting  an acute intramural hematoma of the thoracic aorta extending from the left subclavian artery to the renal artery origins, her BP brought under strict control and her xarelto reversed  CTS and VVS evaluated noted with type B aortic intramural hematoma  06/13/2018 1.  Left subclavian to  carotid artery transposition 2.  Percutaneous access right common femoral artery 3.  Thoracic endovascular endo-grafting with left subclavian artery coverage with 28 x 28 x 15 cm distal and 34 x 34 x 20 cm proximal Gore CTAG 4.  Left common carotid artery stent with 7 x 40 mm Innova distal an 8 x 40 mm Innova proximally 5.  Right common femoral endarterectomy with greater saphenous vein patch angioplasty 6.  Intravascular ultrasound thoracic aorta.  developed tachycardia/hypotension found with lymphatic leak  06/16/2018 1.  Exploration left neck wound placement of 10 flat drain 2.  Left sided 28 French chest tube placement  TTE done during this stay noted LVEF 55-60%, no WMA, grade I DD, There was evidence of aortic dissection flap noted   in the descending thoracic aorta (0.37cm flap posterior). Seen on   CT scan. No significant VHD.  She last saw Dr. Donzetta Matters 05/16/2019 doing well from her surgical perspective.  There was mention of some hematuria that she had apparently had in thepast as well.  He mentioned to discuss wether she needed the Xarelto (noting on it for DVT/PE history , no mention of Afib hx), and asked the pt to f/u with her PMD, but did say she needed lifeling ASA therapy for her stents.  She was stable from a vascular perspective in review of her CT scan and planned for annual visit with him.  Looks like she saw  her PMD Sept 2020 for constipation, neck pain and discussion about wanting to stop her xarelto.  She was recommended to discuss a/c with [place], noting CHA2DS2Vasc score of 5 and she recommended lifelong a/c   I saw her Dec 2020 for Dr. Curt Bears,  needing cardiac clearance for her pending C3-4, C4-5, C5-6, C6-7 ANTERIOR CERVICAL FUSION, not yet scheduled, planned for general anesthesia.   She had been cleared by [facility] for 3 days off her xarelto. Her Afib hx was unclear, problem list reports she had MI associated with Afib uin 2000, Epic notes reviewed as far back as I could go,  problem list in 2014 mentions afib goes back to age 50 managed with medicines, no helpful or other information in care everywhere.   She reported no palpitations or symptoms of her AFib for a few years.  Gave history that she was diagnosed with AFib at age 33 and has had palpitations all her life though not until her adult years did it become symptomatic and reported back in 2000 having a heart attack attribute to her AFib.  Since being put on her current regime years ago, it has been well controlled. She was having significant pain with her C-spine disease, neck, shoulder and L arm, and in the last few weeks R posterior arm as well, with some swelling.  She denied any missed doses of her xarelto.  In discussion for her surgical clearance of her exertional capacity/estimate of her METS, she described fairly significant exertional incapacities, she avoided stairs these made her SOB, mentioned prior to her procedures, hospital stay above Aug 2019 she would walk her dog every day, but since then just has not had the energy, and as time has passed feels she reported getting winded, SOB with minimal exertion.  She gave an example that when shopping with her daughters she will sit while they browse around.  She said some days she could probably walk a block, but generally did not think she could. She doesn't vacuume or sweep because these make her SOB  She mentioned a new CP in the prior 2 weeks, she says she thought it was the way she sleeps at night that makes her chest hurt, maybe laying on her L side, but then says some times it is a strong ache during the day that worries her, but then passes it off to musculoskeletal, "but I am not a doctor, so I don't know"  No reports of near syncope or syncope.  She reported when she was on high dose ASA with her Xarelto she had hematuria, though on low dose less.  She saw her urologist the week prior had follow up planned.  She had not been told to stop her xarelto,  and hematuria is occasional and not new.  No other bleeding or signs of bleeding.  mantions when she was on Plavix and xarelto she had rectal bleeding at times.  Given her symptoms (though not particularly cardiac sounding CP, with reports of reduced exertional capacity over the last couple years, planned for ischemic 2/u prior to her surgery. TTE noted LVEF 60-65%, elevated LVEDP, grade I DD, no WMA, no significant VHD.  Stress test was negative, low risk. Given elevated LVEDP and some DD recommended to start low dose lasix.  F/u BMET was done with stable findings.  She was felt to be low cardiac risk for her planned C-spine surgery.  I saw her again 11/18/19, pre-op She is also now pending a L bunion surgery  as well as her C-spine fusion, neither scheduled yet.  She mentions that she spoke with her PMD about the pain in her arm and also felt it sounded like was 2/2 her C spine issue, She does not have any ongoing c/o CP since seen here last.  Does not exercise or do much physically, as discussed above. No new complaints or symptoms  RCRI score is zero, 0.4% risk (low risk) Felt acceptable cardiac risk for bunion surgery  She saw Dr. Curt Bears 06/23/21, she had been struggling with hematuria, resolved off ASA and Xarelto, urologic evaluation did not find cause for bleeding, Xarelto resumed with recurrent bleeding, was off a/c, on ASA. She reported feeling well, no CP, no SOB No symptoms of Afib No bleeding on ASA  Planned for an annual visit  Needs clearance for cataract surgery (scheduled 9/27), pre-op pool ultimately addressed, noting cataract surgery typically would not require cardiac clearance and felt her to be an acceptable candidate without additional cardiac testing.  TODAY She is doing very well Exercising, walking on the treadmill, some other exercises, goes to the gym sometimes as well. No CP, palpitations, SOB  No hematuria off Xarelto She feels confident she would know if she has  had AFib Has not had any symptoms in years  No near syncope or syncope.  PMD does her labs   Past Medical History:  Diagnosis Date   Allergy    Anemia    Arthritis    "qwhere" (07/29/2018)   Atrial fibrillation (HCC)    CHF (congestive heart failure) (HCC)    Clotting disorder (HCC)    Degenerative arthritis of hip    s/p L THR 12/2012   Dyspnea    since surgery in August 2019- "when I get worked up and walk a short distance"   Dysrhythmia    History of blood transfusion 05/2018   "related to OR"   Hyperlipidemia    Hypertension    Intramural hematoma of thoracic aorta ([place]) 06/13/2018   Migraines    mIgraines- none since blood pressure and lipids are under control   Myocardial infarction ([place]) 2000   due to atrial fib   Neuromuscular disorder (HCC)    pinched nerve- left side of neck   Pulmonary emboli ([place]) 12/2012 dx   post op (L THR), anticoag x 59mo   Past Surgical History:  Procedure Laterality Date   ABDOMINAL HYSTERECTOMY     "w/1 tube"   ANTERIOR CERVICAL DECOMPRESSION/DISCECTOMY FUSION 4 LEVELS N/A 02/02/2020   Procedure: CERVICAL THREE-FOUR, CERVICAL FOUR-FIVE, CERVICAL FIVE-SIX, CERVICAL SIX-SEVEN ANTERIOR CERVICAL DECOMPRESSION/DISCECTOMY FUSION;  Surgeon: PEarnie Larsson MD;  [facility] [facility]  Service: Neurosurgery;  Laterality: N/A;   ARTERY REPAIR Left 08/15/2018   Procedure: LEFT BRACHIAL ARTERY EXPLORATION;  Surgeon: CWaynetta Sandy MD;  [place] [facility]  Service: Vascular;  Laterality: Left;   Breast Ultrasound Left 04/16/2013   Done @ breast center Impression: no malignancy appearance noted on the screen study is consistent with a summation shadow   BUNIONECTOMY Left    CARDIAC CATHETERIZATION     CAROTID-SUBCLAVIAN BYPASS GRAFT Left 06/13/2018   Procedure: BYPASS GRAFT CAROTID-SUBCLAVIAN;  Surgeon: CWaynetta Sandy MD;  [place] [facility]  Service: Vascular;  Laterality: Left;   COLONOSCOPY     CYSTOSCOPY WITH RETROGRADE PYELOGRAM,  URETEROSCOPY AND STENT PLACEMENT Bilateral 11/02/2020   Procedure: DIAGNOSTIC CYSTOSCOPY WITH RETROGRADE PYELOGRAM,BILATERAL DIAGNOSTIC URETEROSCOPY  AND BILATERAL STENT PLACEMENT;  Surgeon: PRobley Fries MD;  [facility];  Service: Urology;  Laterality: Bilateral;  71 MINS   DG TUMB RIGHT HAND Right    cyst removal   IR THORACENTESIS ASP PLEURAL SPACE W/IMG GUIDE  05/31/2018   JOINT REPLACEMENT     KNEE ARTHROSCOPY Left 12/16/2020   Procedure: LEFT KNEE ARTHROSCOPY WITH PARTIAL LATERAL MENISCECTOMY;  Surgeon: Mcarthur Rossetti, MD;  Location: [place];  Service: Orthopedics;  Laterality: Left;   THORACIC AORTIC ENDOVASCULAR STENT GRAFT Left 06/13/2018   Procedure: LEFT  SUBCLAVIAN TO CAROTID ARTERY TRANSPOSITION; THORACIC AORTIC ENDOVASCULAR STENT GRAFT using GORE CONFORMABLE THORACIC STENT GRAFT AND GORE TAG THORACIC ENDOPROSTHESIS; STENT LEFT COMMON CAROTID ARTERY, RIGHT COMMON-FEMORAL ENDARTERECTOMY WITH PATCH ANGIOPLASTY;  Surgeon: Waynetta Sandy, MD;  Location: [place];  Service: Vascular;  Laterality: Left;   TONSILLECTOMY     TOTAL HIP ARTHROPLASTY Left 01/03/2013   Procedure: TOTAL HIP ARTHROPLASTY ANTERIOR APPROACH;  Surgeon: Mcarthur Rossetti, MD;  Location: [facility];  Service: Orthopedics;  Laterality: Left;  Left Total Hip Arthroplasty, Anterior Approach   TUBAL LIGATION     VAGINA RECONSTRUCTION SURGERY  1966   vagina had closed up when 71 years old   WOUND EXPLORATION Left 06/16/2018   Procedure: LEFT NECK WOUND EXPLORATION, CHEST TUBE INSERTION;  Surgeon: Waynetta Sandy, MD;  Location: [place];  Service: Vascular;  Laterality: Left;    Current Outpatient Medications  Medication Sig Dispense Refill   acetaminophen (TYLENOL) 500 MG tablet Take 1,000 mg by mouth every 8 (eight) hours as needed for moderate pain.     aspirin EC 81 MG tablet Take 81 mg by mouth at bedtime.     B Complex Vitamins (VITAMIN B  COMPLEX) TABS Take 1 tablet by mouth daily.     clindamycin (CLEOCIN T) 1 % lotion Apply 1 application. topically as needed.     diltiazem (DILACOR XR) 240 MG 24 hr capsule TAKE ONE CAPSULE BY MOUTH EVERY MORNING 90 capsule 1   ferrous sulfate 325 (65 FE) MG tablet Take 325 mg by mouth daily with breakfast.     furosemide (LASIX) 20 MG tablet Take 1 tablet (20 mg total) by mouth at bedtime. 30 tablet 0   LINZESS 72 MCG capsule TAKE 1 CAPSULE BY MOUTH DAILY BEFORE BREAKFAST 30 capsule 2   METAMUCIL FIBER PO Take 1 Dose by mouth in the morning and at bedtime.     metroNIDAZOLE (METROCREAM) 0.75 % cream Apply 1 application. topically as needed.     Multiple Vitamin (MULTIVITAMIN) tablet Take 2 tablets by mouth daily. Gummies     polyethylene glycol powder (GLYCOLAX/MIRALAX) 17 GM/SCOOP powder Take 17 g by mouth daily. 3350 g 11   rosuvastatin (CRESTOR) 20 MG tablet TAKE 1 TABLET BY MOUTH EVERY DAY 90 tablet 3   prednisoLONE acetate (PRED FORTE) 1 % ophthalmic suspension SMARTSIG:In Eye(s)     Current Facility-Administered Medications  Medication Dose Route Frequency Provider Last Rate Last Admin   methylPREDNISolone acetate (DEPO-MEDROL) injection 80 mg  80 mg Intramuscular Once [name], DO        Allergies:   Penicillins, Shellfish allergy, Latex, Banana, Kiwi extract, Lactose intolerance (gi), Oxycodone, Asa [aspirin], Celebrex [celecoxib], and Nsaids   Social History:  The patient  reports that she has never smoked. She has never used smokeless tobacco. She reports that she does not currently use alcohol. She reports that she does not use drugs.   Family History:  The patient's family history includes Alcohol abuse in an other  family member; Breast cancer in an other family member; Diabetes in her mother, sister, and another family member; Heart attack (age of onset: 15) in her mother; Heart disease in her father, mother, and another family member; Hyperlipidemia in an other family  member; Hypertension in an other family member.  ROS:  Please see the history of present illness.  All other systems are reviewed and otherwise negative.   PHYSICAL EXAM:  VS:  BP 130/76   Pulse 68   Ht '5\' 4"'$  (1.626 m)   Wt 171 lb (77.6 kg)   SpO2 97%   BMI 29.35 kg/m  BMI: Body mass index is 29.35 kg/m. Well nourished, well developed, in no acute distress  HEENT: normocephalic, atraumatic  Neck: no JVD, carotid bruits or masses Cardiac: RRR; no significant murmurs, no rubs, or gallops Lungs:  CTA b/l , no wheezing, rhonchi or rales  Abd: soft, nontender MS: no deformity or atrophy Ext: no edema Skin: warm and dry, no rash Neuro:  No gross deficits appreciated Psych: euthymic mood, full affect    EKG:  done today and reviewed by myself SR 68bpm   10/20/2019: Lexiscan stress myoview Nuclear stress EF: 66%. There was no ST segment deviation noted during stress. No T wave inversion was noted during stress. The study is normal. This is a low risk study. No ischemia. The left ventricular ejection fraction is hyperdynamic (>65%).  10/20/2019 TTE IMPRESSIONS  1. Left ventricular ejection fraction, by visual estimation, is 60 to 65%. The left ventricle has normal function. There is no left ventricular hypertrophy.  2. Elevated left ventricular end-diastolic pressure.  3. Left ventricular diastolic parameters are consistent with Grade I diastolic dysfunction (impaired relaxation).  4. The left ventricle has no regional wall motion abnormalities.  5. Global right ventricle has normal systolic function.The right ventricular size is normal. No increase in right ventricular wall thickness.  6. Left atrial size was mildly dilated.  7. Right atrial size was normal.  8. The mitral valve is normal in structure. Trivial mitral valve regurgitation. No evidence of mitral stenosis.  9. The tricuspid valve is normal in structure. 10. The aortic valve is normal in structure. Aortic valve  regurgitation is not visualized. No evidence of aortic valve sclerosis or stenosis. 11. The pulmonic valve was normal in structure. Pulmonic valve regurgitation is not visualized. 12. Normal pulmonary artery systolic pressure. 13. The tricuspid regurgitant velocity is 2.12 m/s, and with an assumed right atrial pressure of 3 mmHg, the estimated right ventricular systolic pressure is normal at 21.0 mmHg. 14. The inferior vena cava is normal in size with greater than 50% respiratory variability, suggesting right atrial pressure of 3 mmHg.    05/28/2018: TTE Study Conclusions - HPI and indications: 441.01 Dissection of Aorta , Thoracic. - Left ventricle: The cavity size was normal. Systolic function was   normal. The estimated ejection fraction was in the range of 55%   to 60%. Wall motion was normal; there were no regional wall   motion abnormalities. Doppler parameters are consistent with   abnormal left ventricular relaxation (grade 1 diastolic   dysfunction). - Aortic valve: Trileaflet; mildly thickened leaflets. There was no   regurgitation. - Aortic root: There was evidence of aortic dissection flap noted   in the descending thoracic aorta (0.37cm flap posterior). Seen on   CT scan. - Mitral valve: Calcified annulus. - Tricuspid valve: There was mild regurgitation. - Pulmonary arteries: Systolic pressure was mildly increased. PA   peak pressure:  36 mm Hg (S). - Pericardium, extracardiac: A trivial pericardial effusion was   identified posterior to the heart   2D echo 07/19/2016 Study Conclusions - Left ventricle: Distal septal hypokinesis. The cavity size was   normal. Systolic function was normal. The estimated ejection   fraction was 55%. Wall motion was normal; there were no regional   wall motion abnormalities. Doppler parameters are consistent with   both elevated ventricular end-diastolic filling pressure and   elevated left atrial filling pressure. - Atrial septum: No  defect or patent foramen ovale was identified. - Pericardium, extracardiac: A trivial pericardial effusion was   identified posterior to the heart  Recent Labs: 04/17/2022: ALT 11; BUN 16; Creatinine, Ser 1.02; Hemoglobin 10.8; Platelets 249.0; Potassium 4.2; Sodium 140  04/17/2022: Cholesterol 177; HDL 53.00; LDL Cholesterol 106; Total CHOL/HDL Ratio 3; Triglycerides 89.0; VLDL 17.8   CrCl cannot be calculated (Patient's most recent lab result is older than the maximum 21 days allowed.).   Wt Readings from Last 3 Encounters:  08/08/22 171 lb (77.6 kg)  05/17/22 169 lb (76.7 kg)  05/10/22 169 lb (76.7 kg)     Other studies reviewed: Additional studies/records reviewed today include: summarized above  ASSESSMENT AND PLAN:  1. Atrial fibrillation     CHA2DS2Vasc is 5 (including gender)      H/o provoked DVT/PE (post hip surgery)      Now/low burden by lack of symptoms  I don't think we have any formal record of her Afib though she relates a clear and known longstanding diagnosis of this OFF a/c 2/2 recurrent hematuria  She does not want to consider alternative [place], or resuming a/c Discussed today monitoring options  Wearable tech, Kardia, perhaps loop monitor. She would not want to pursue loop, but will look into the kardia   2. HTN     Looks OK    Disposition:     Current medicines are reviewed at length with the patient today.  The patient did not have any concerns regarding medicines.  Venetia Night, PA-C 08/08/2022 1:00 PM     [place] [address] [place] [id number] [phone] (office)  [phone] (fax)

## 2022-08-08 ENCOUNTER — Ambulatory Visit: Payer: Medicare Other | Attending: Physician Assistant | Admitting: Physician Assistant

## 2022-08-08 VITALS — BP 130/76 | HR 68 | Ht 64.0 in | Wt 171.0 lb

## 2022-08-08 DIAGNOSIS — I480 Paroxysmal atrial fibrillation: Secondary | ICD-10-CM | POA: Diagnosis not present

## 2022-08-08 DIAGNOSIS — I10 Essential (primary) hypertension: Secondary | ICD-10-CM | POA: Insufficient documentation

## 2022-08-08 NOTE — Patient Instructions (Signed)
Medication Instructions:   Your physician recommends that you continue on your current medications as directed. Please refer to the Current Medication list given to you today.  *If you need a refill on your cardiac medications before your next appointment, please call your pharmacy*   Lab Work: [place]   If you have labs (blood work) drawn today and your tests are completely normal, you will receive your results only by: [place] (if you have MyChart) OR A paper copy in the mail If you have any lab test that is abnormal or we need to change your treatment, we will call you to review the results.   Testing/Procedures: NONE ORDERED  TODAY   Follow-Up: At [facility], you and your health needs are our priority.  As part of our continuing mission to provide you with exceptional heart care, we have created designated Provider Care Teams.  These Care Teams include your primary Cardiologist (physician) and Advanced Practice Providers (APPs -  Physician Assistants and Nurse Practitioners) who all work together to provide you with the care you need, when you need it.  We recommend signing up for the patient portal called "MyChart".  Sign up information is provided on this After Visit Summary.  MyChart is used to connect with patients for Virtual Visits (Telemedicine).  Patients are able to view lab/test results, encounter notes, upcoming appointments, etc.  Non-urgent messages can be sent to your provider as well.   To learn more about what you can do with MyChart, go to [email or web address].    Your next appointment:   6 month(s)  The format for your next appointment:   In Person  Provider:   You may see Dr, Curt Bears  or one of the following Advanced Practice Providers on your designated Care Team:   Tommye Standard, [place] Legrand Como "Jonni Sanger" Chalmers Cater, [place]   Other Instructions   Important Information About [place]

## 2022-08-29 NOTE — Progress Notes (Unsigned)
Samantha Stevenson D.Samantha Stevenson Phone: [phone]   Assessment and Plan:     There are no diagnoses linked to this encounter.  ***   Pertinent previous records reviewed include ***   Follow Up: ***     Subjective:   I, Hser Belanger, am serving as a Education administrator for Doctor [name]   Chief Complaint: bilateral arm and shoulder pain    HPI:  04/26/2022 Patient is a 71 year old female complaining of bilateral arm and shoulder pain. Patient states that she has a pain that radiates down to her fingertips it comes and it goes sometimes it stays for a month and then sometimes its a little pain, sometimes it gets to a point she cant do ADLs, has been going on for about 3 months now no MOI, states she had bruising on her bicep but no bruising now, has done PT but states that it didn't help , isnt able to sleep does radiate up into he neck, states it came on all of a sudden, no numbness or tingling, no locking clicking or popping , has been taking tylenol and has used tramadol and that seems to help with the pain   05/17/2022 Patient states that she is a whole lot better no complaints   08/30/2022 Patient states    Relevant Historical Information: History of C3-C7 fusion  Additional pertinent review of systems negative.   Current Outpatient Medications:    acetaminophen (TYLENOL) 500 MG tablet, Take 1,000 mg by mouth every 8 (eight) hours as needed for moderate pain., Disp: , Rfl:    aspirin EC 81 MG tablet, Take 81 mg by mouth at bedtime., Disp: , Rfl:    B Complex Vitamins (VITAMIN B COMPLEX) TABS, Take 1 tablet by mouth daily., Disp: , Rfl:    clindamycin (CLEOCIN T) 1 % lotion, Apply 1 application. topically as needed., Disp: , Rfl:    diltiazem (DILACOR XR) 240 MG 24 hr capsule, TAKE ONE CAPSULE BY MOUTH EVERY MORNING, Disp: 90 capsule, Rfl: 1   ferrous sulfate 325 (65 FE) MG tablet, Take 325 mg by mouth  daily with breakfast., Disp: , Rfl:    furosemide (LASIX) 20 MG tablet, Take 1 tablet (20 mg total) by mouth at bedtime., Disp: 30 tablet, Rfl: 0   LINZESS 72 MCG capsule, TAKE 1 CAPSULE BY MOUTH DAILY BEFORE BREAKFAST, Disp: 30 capsule, Rfl: 2   METAMUCIL FIBER PO, Take 1 Dose by mouth in the morning and at bedtime., Disp: , Rfl:    metroNIDAZOLE (METROCREAM) 0.75 % cream, Apply 1 application. topically as needed., Disp: , Rfl:    Multiple Vitamin (MULTIVITAMIN) tablet, Take 2 tablets by mouth daily. Gummies, Disp: , Rfl:    polyethylene glycol powder (GLYCOLAX/MIRALAX) 17 GM/SCOOP powder, Take 17 g by mouth daily., Disp: 3350 g, Rfl: 11   prednisoLONE acetate (PRED FORTE) 1 % ophthalmic suspension, SMARTSIG:In Eye(s), Disp: , Rfl:    rosuvastatin (CRESTOR) 20 MG tablet, TAKE 1 TABLET BY MOUTH EVERY DAY, Disp: 90 tablet, Rfl: 3  Current Facility-Administered Medications:    methylPREDNISolone acetate (DEPO-MEDROL) injection 80 mg, 80 mg, Intramuscular, Once, [name], DO   Objective:     There were no vitals filed for this visit.    There is no height or weight on file to calculate BMI.    Physical Exam:    ***   Electronically signed by:  Samantha Stevenson D.Merril Abbe [place]  [facility] 7:43 AM 08/29/22

## 2022-08-30 ENCOUNTER — Ambulatory Visit (INDEPENDENT_AMBULATORY_CARE_PROVIDER_SITE_OTHER): Payer: Medicare Other | Admitting: Sports Medicine

## 2022-08-30 ENCOUNTER — Ambulatory Visit (INDEPENDENT_AMBULATORY_CARE_PROVIDER_SITE_OTHER): Payer: Medicare Other

## 2022-08-30 VITALS — BP 136/78 | HR 71 | Ht 64.0 in | Wt 174.0 lb

## 2022-08-30 DIAGNOSIS — M4712 Other spondylosis with myelopathy, cervical region: Secondary | ICD-10-CM

## 2022-08-30 DIAGNOSIS — M47816 Spondylosis without myelopathy or radiculopathy, lumbar region: Secondary | ICD-10-CM | POA: Diagnosis not present

## 2022-08-30 DIAGNOSIS — M5442 Lumbago with sciatica, left side: Secondary | ICD-10-CM

## 2022-08-30 DIAGNOSIS — Z96642 Presence of left artificial hip joint: Secondary | ICD-10-CM | POA: Diagnosis not present

## 2022-08-30 DIAGNOSIS — M25552 Pain in left hip: Secondary | ICD-10-CM | POA: Diagnosis not present

## 2022-08-30 DIAGNOSIS — M542 Cervicalgia: Secondary | ICD-10-CM

## 2022-08-30 DIAGNOSIS — M4722 Other spondylosis with radiculopathy, cervical region: Secondary | ICD-10-CM | POA: Diagnosis not present

## 2022-08-30 DIAGNOSIS — G8929 Other chronic pain: Secondary | ICD-10-CM

## 2022-08-30 DIAGNOSIS — M545 Low back pain, unspecified: Secondary | ICD-10-CM | POA: Diagnosis not present

## 2022-08-30 MED ORDER — METHYLPREDNISOLONE ACETATE 40 MG/ML IJ SUSP
40.0000 mg | Freq: Once | INTRAMUSCULAR | Status: AC
  Administered 2022-08-30: 40 mg via INTRAMUSCULAR

## 2022-08-30 MED ORDER — MELOXICAM 15 MG PO TABS: 15.0000 mg | ORAL_TABLET | Freq: Every day | ORAL | 0 refills | Status: DC

## 2022-08-30 NOTE — Addendum Note (Signed)
Addended by: Judy Pimple R on: 08/30/2022 11:12 AM   Modules accepted: Orders

## 2022-08-30 NOTE — Patient Instructions (Addendum)
Good to see you  Xrays on the way out  - Start meloxicam 15 mg daily x2 weeks.  If still having pain after 2 weeks, complete 3rd-week of meloxicam. May use remaining meloxicam as needed once daily for pain control.  Do not to use additional NSAIDs while taking meloxicam.  May use Tylenol [phone] mg 2 to 3 times a day for breakthrough pain. 2 week follow up

## 2022-09-02 DIAGNOSIS — Z1231 Encounter for screening mammogram for malignant neoplasm of breast: Secondary | ICD-10-CM | POA: Diagnosis not present

## 2022-09-02 LAB — HM MAMMOGRAPHY

## 2022-09-12 NOTE — Progress Notes (Unsigned)
Benito Mccreedy D.[place] Phone: [phone]   Assessment and Plan:     There are no diagnoses linked to this encounter.  ***   Pertinent previous records reviewed include ***   Follow Up: ***     Subjective:   I, Karlin Binion, am serving as a Education administrator for Doctor [name]   Chief Complaint: bilateral arm and shoulder pain    HPI:  04/26/2022 Patient is a 71 year old female complaining of bilateral arm and shoulder pain. Patient states that she has a pain that radiates down to her fingertips it comes and it goes sometimes it stays for a month and then sometimes its a little pain, sometimes it gets to a point she cant do ADLs, has been going on for about 3 months now no MOI, states she had bruising on her bicep but no bruising now, has done PT but states that it didn't help , isnt able to sleep does radiate up into he neck, states it came on all of a sudden, no numbness or tingling, no locking clicking or popping , has been taking tylenol and has used tramadol and that seems to help with the pain   05/17/2022 Patient states that she is a whole lot better no complaints   08/30/2022 Patient states that her arm pain is back radiates down her shoulder to her arm and down her back , isnt able to lift things at times , TTP in her forearm and notes swelling and pain , no MOI been going on for a month , isnt able to sleep on that side due to pain    09/13/2022 Patient states  Relevant Historical Information: History of C3-C7 fusion  Additional pertinent review of systems negative.   Current Outpatient Medications:    acetaminophen (TYLENOL) 500 MG tablet, Take 1,000 mg by mouth every 8 (eight) hours as needed for moderate pain., Disp: , Rfl:    aspirin EC 81 MG tablet, Take 81 mg by mouth at bedtime., Disp: , Rfl:    B Complex Vitamins (VITAMIN B COMPLEX) TABS, Take 1 tablet by mouth daily., Disp: , Rfl:     clindamycin (CLEOCIN T) 1 % lotion, Apply 1 application. topically as needed., Disp: , Rfl:    diltiazem (DILACOR XR) 240 MG 24 hr capsule, TAKE ONE CAPSULE BY MOUTH EVERY MORNING, Disp: 90 capsule, Rfl: 1   ferrous sulfate 325 (65 FE) MG tablet, Take 325 mg by mouth daily with breakfast., Disp: , Rfl:    furosemide (LASIX) 20 MG tablet, Take 1 tablet (20 mg total) by mouth at bedtime., Disp: 30 tablet, Rfl: 0   LINZESS 72 MCG capsule, TAKE 1 CAPSULE BY MOUTH DAILY BEFORE BREAKFAST, Disp: 30 capsule, Rfl: 2   meloxicam (MOBIC) 15 MG tablet, Take 1 tablet (15 mg total) by mouth daily., Disp: 30 tablet, Rfl: 0   METAMUCIL FIBER PO, Take 1 Dose by mouth in the morning and at bedtime., Disp: , Rfl:    metroNIDAZOLE (METROCREAM) 0.75 % cream, Apply 1 application. topically as needed., Disp: , Rfl:    Multiple Vitamin (MULTIVITAMIN) tablet, Take 2 tablets by mouth daily. Gummies, Disp: , Rfl:    polyethylene glycol powder (GLYCOLAX/MIRALAX) 17 GM/SCOOP powder, Take 17 g by mouth daily., Disp: 3350 g, Rfl: 11   prednisoLONE acetate (PRED FORTE) 1 % ophthalmic suspension, SMARTSIG:In Eye(s), Disp: , Rfl:    rosuvastatin (CRESTOR) 20 MG tablet, TAKE  1 TABLET BY MOUTH EVERY DAY, Disp: 90 tablet, Rfl: 3  Current Facility-Administered Medications:    methylPREDNISolone acetate (DEPO-MEDROL) injection 80 mg, 80 mg, Intramuscular, Once, [name], DO   Objective:     There were no vitals filed for this visit.    There is no height or weight on file to calculate BMI.    Physical Exam:    ***   Electronically signed by:  Benito Mccreedy D.[facility] 7:27 AM 09/12/22

## 2022-09-13 ENCOUNTER — Ambulatory Visit (INDEPENDENT_AMBULATORY_CARE_PROVIDER_SITE_OTHER): Payer: Medicare Other | Admitting: Sports Medicine

## 2022-09-13 VITALS — BP 122/78 | HR 69 | Ht 64.0 in | Wt 173.0 lb

## 2022-09-13 DIAGNOSIS — M48062 Spinal stenosis, lumbar region with neurogenic claudication: Secondary | ICD-10-CM | POA: Diagnosis not present

## 2022-09-13 DIAGNOSIS — G8929 Other chronic pain: Secondary | ICD-10-CM | POA: Diagnosis not present

## 2022-09-13 DIAGNOSIS — Z96642 Presence of left artificial hip joint: Secondary | ICD-10-CM | POA: Diagnosis not present

## 2022-09-13 DIAGNOSIS — M5442 Lumbago with sciatica, left side: Secondary | ICD-10-CM | POA: Diagnosis not present

## 2022-09-13 NOTE — Patient Instructions (Addendum)
Good to see you  MRI lumbar  Discontinue daily meloxicam use remainder as needed  Follow up 3 days after to discuss your results

## 2022-09-19 ENCOUNTER — Other Ambulatory Visit (HOSPITAL_COMMUNITY)
Admission: RE | Admit: 2022-09-19 | Discharge: 2022-09-19 | Disposition: A | Discharge status: Home / Self Care | Payer: Medicare Other | Attending: Obstetrics and Gynecology | Admitting: Obstetrics and Gynecology

## 2022-09-19 ENCOUNTER — Ambulatory Visit (INDEPENDENT_AMBULATORY_CARE_PROVIDER_SITE_OTHER): Payer: Medicare Other | Admitting: Obstetrics and Gynecology

## 2022-09-19 ENCOUNTER — Encounter: Payer: Self-pay | Admitting: Obstetrics and Gynecology

## 2022-09-19 VITALS — BP 139/75 | HR 68 | Ht 62.0 in | Wt 173.0 lb

## 2022-09-19 DIAGNOSIS — N816 Rectocele: Secondary | ICD-10-CM

## 2022-09-19 DIAGNOSIS — R350 Frequency of micturition: Secondary | ICD-10-CM | POA: Insufficient documentation

## 2022-09-19 DIAGNOSIS — R319 Hematuria, unspecified: Secondary | ICD-10-CM | POA: Diagnosis not present

## 2022-09-19 LAB — URINALYSIS, ROUTINE W REFLEX MICROSCOPIC
Bilirubin Urine: NEGATIVE
Glucose, UA: NEGATIVE mg/dL
Ketones, ur: NEGATIVE mg/dL
Leukocytes,Ua: NEGATIVE
Nitrite: NEGATIVE
Protein, ur: 100 mg/dL — AB
Specific Gravity, Urine: 1.023 (ref 1.005–1.030)
pH: 5 (ref 5.0–8.0)

## 2022-09-19 LAB — POCT URINALYSIS DIPSTICK
Bilirubin, UA: NEGATIVE
Glucose, UA: NEGATIVE
Ketones, UA: NEGATIVE
Leukocytes, UA: NEGATIVE
Nitrite, UA: NEGATIVE
Protein, UA: POSITIVE — AB
Spec Grav, UA: >=1.03 — AB (ref 1.010–1.025)
Urobilinogen, UA: 0.2 E.U./dL
pH, UA: 6 (ref 5.0–8.0)

## 2022-09-19 NOTE — Addendum Note (Signed)
Addended by: Elita Quick on: 09/19/2022 11:23 AM   Modules accepted: Orders

## 2022-09-19 NOTE — Progress Notes (Signed)
[facility] New Patient Evaluation and Consultation  Referring Provider: Janyth Contes, * PCP: Hoyt Koch, MD Date of Service: 09/19/2022  SUBJECTIVE Chief Complaint: New Patient (Initial Visit) Samantha Stevenson is a 71 y.o. female here for a consult for prolapse. Pt said it does not bother her./)  History of Present Illness: Samantha Stevenson is a 71 y.o. Black or African-American female seen in consultation at the request of Dr. Sandford Craze for evaluation of prolapse.    Review of records from Dr Sandford Craze significant for: Has some prolapse and urgency but no incontinence. S/p hysterectomy.   Dr. Claudia Desanctis at [facility] on 11/02/2020 for evaluation and management of persistent episodes of intermittent gross hematuria. Per op note: Intraoperative findings:  1. Normal urethra 2. Normal cystoscopy with bilateral orthotopic ureteral orifices, no bladder masses or lesions 3. Left retrograde pyelogram without filling defects however contrast retained in collecting system for majority of case and on diagnostic ureteroscopy narrow left UPJ; unable to traverse flexible ureteroscope past UPJ 4. Right retrograde pyelogram without filling defects and brisk drainage of contrast 5. Right diagnostic ureteroscopy without abnormality or masses 6. 6 French by 24 cm JJ bilateral ureteral stents without tether   Urinary Symptoms: Does not leak urine.   Day time voids 2-3.  Nocturia: 0-1 times per night to void. Voiding dysfunction: she does not empty her bladder well.  does not use a catheter to empty bladder.   UTIs:  0  UTI's in the last year.   Has history of gross hematuria in 2022- had kidney stones at the time. Has a history of stent placement. CT scan done in May 2023 showed normal kidneys and no hydroureteronephrosis.   Pelvic Organ Prolapse Symptoms:                  She Denies a feeling of a bulge the vaginal area.  She does not have any  symptoms of prolapse.   Bowel Symptom: Bowel movements: 1-2 time(s) per day Stool consistency: soft  Straining: no.  Splinting: no.  Incomplete evacuation: yes.  She Denies accidental bowel leakage / fecal incontinence Bowel regimen: fiber, miralax, and linzess   Sexual Function Sexually active: no.    Pelvic Pain Admits to pelvic pain Location: pelvic area Pain occurs: "every now and then"    Past Medical History:  Past Medical History:  Diagnosis Date   Allergy    Anemia    Arthritis    "qwhere" (07/29/2018)   Atrial fibrillation (HCC)    CHF (congestive heart failure) (HCC)    Clotting disorder (HCC)    Degenerative arthritis of hip    s/p L THR 12/2012   Dyspnea    since surgery in August 2019- "when I get worked up and walk a short distance"   Dysrhythmia    History of blood transfusion 05/2018   "related to OR"   Hyperlipidemia    Hypertension    Intramural hematoma of thoracic aorta ([place]) 06/13/2018   Migraines    mIgraines- none since blood pressure and lipids are under control   Myocardial infarction ([place]) 2000   due to atrial fib   Neuromuscular disorder (HCC)    pinched nerve- left side of neck   Pulmonary emboli ([place]) 12/2012 dx   post op (L THR), anticoag x 62mo    Past Surgical History:   Past Surgical History:  Procedure Laterality Date   ABDOMINAL HYSTERECTOMY     "w/1 tube"   ANTERIOR CERVICAL DECOMPRESSION/DISCECTOMY  FUSION 4 LEVELS N/A 02/02/2020   Procedure: CERVICAL THREE-FOUR, CERVICAL FOUR-FIVE, CERVICAL FIVE-SIX, CERVICAL SIX-SEVEN ANTERIOR CERVICAL DECOMPRESSION/DISCECTOMY FUSION;  Surgeon: Earnie Larsson, MD;  Location: [facility];  Service: Neurosurgery;  Laterality: N/A;   ARTERY REPAIR Left 08/15/2018   Procedure: LEFT BRACHIAL ARTERY EXPLORATION;  Surgeon: Waynetta Sandy, MD;  Location: [facility];  Service: Vascular;  Laterality: Left;   Breast Ultrasound Left 04/16/2013   Done @ breast center Impression: no malignancy  appearance noted on the screen study is consistent with a summation shadow   BUNIONECTOMY Left    CARDIAC CATHETERIZATION     CAROTID-SUBCLAVIAN BYPASS GRAFT Left 06/13/2018   Procedure: BYPASS GRAFT CAROTID-SUBCLAVIAN;  Surgeon: Waynetta Sandy, MD;  Location: [facility];  Service: Vascular;  Laterality: Left;   COLONOSCOPY     CYSTOSCOPY WITH RETROGRADE PYELOGRAM, URETEROSCOPY AND STENT PLACEMENT Bilateral 11/02/2020   Procedure: DIAGNOSTIC CYSTOSCOPY WITH RETROGRADE PYELOGRAM,BILATERAL DIAGNOSTIC URETEROSCOPY  AND BILATERAL STENT PLACEMENT;  Surgeon: Robley Fries, MD;  Location: [facility];  Service: Urology;  Laterality: Bilateral;  28 MINS   DG TUMB RIGHT HAND Right    cyst removal   IR THORACENTESIS ASP PLEURAL SPACE W/IMG GUIDE  05/31/2018   JOINT REPLACEMENT     KNEE ARTHROSCOPY Left 12/16/2020   Procedure: LEFT KNEE ARTHROSCOPY WITH PARTIAL LATERAL MENISCECTOMY;  Surgeon: Mcarthur Rossetti, MD;  Location: [place];  Service: Orthopedics;  Laterality: Left;   THORACIC AORTIC ENDOVASCULAR STENT GRAFT Left 06/13/2018   Procedure: LEFT  SUBCLAVIAN TO CAROTID ARTERY TRANSPOSITION; THORACIC AORTIC ENDOVASCULAR STENT GRAFT using GORE CONFORMABLE THORACIC STENT GRAFT AND GORE TAG THORACIC ENDOPROSTHESIS; STENT LEFT COMMON CAROTID ARTERY, RIGHT COMMON-FEMORAL ENDARTERECTOMY WITH PATCH ANGIOPLASTY;  Surgeon: Waynetta Sandy, MD;  Location: [place];  Service: Vascular;  Laterality: Left;   TONSILLECTOMY     TOTAL HIP ARTHROPLASTY Left 01/03/2013   Procedure: TOTAL HIP ARTHROPLASTY ANTERIOR APPROACH;  Surgeon: Mcarthur Rossetti, MD;  Location: [facility];  Service: Orthopedics;  Laterality: Left;  Left Total Hip Arthroplasty, Anterior Approach   TUBAL LIGATION     VAGINA RECONSTRUCTION SURGERY  1966   vagina had closed up when 71 years old   WOUND EXPLORATION Left 06/16/2018   Procedure: LEFT NECK WOUND EXPLORATION, CHEST TUBE INSERTION;   Surgeon: Waynetta Sandy, MD;  Location: [place];  Service: Vascular;  Laterality: Left;     Past OB/GYN History: OB History     Gravida  2   Para  2   Term  2   Preterm      AB      Living  2      SAB      IAB      Ectopic      Multiple      Live Births  2           Vaginal deliveries: 2,  Forceps/ Vacuum deliveries: 0, Cesarean section: 0 S/p hysterectomy   Medications: She has a current medication list which includes the following prescription(s): acetaminophen, aspirin ec, vitamin b complex, clindamycin, diltiazem, ferrous sulfate, furosemide, linzess, meloxicam, fiber, metronidazole, multivitamin, polyethylene glycol powder, prednisolone acetate, and rosuvastatin, and the following Facility-Administered Medications: methylprednisolone acetate.   Allergies: Patient is allergic to penicillins, shellfish allergy, latex, banana, kiwi extract, lactose intolerance (gi), oxycodone, asa [aspirin], celebrex [celecoxib], and nsaids.   Social History:  Social History   Tobacco Use   Smoking status: Never   Smokeless tobacco: Never  Vaping Use  Vaping Use: Never used  Substance Use Topics   Alcohol use: Not Currently   Drug use: Never    Relationship status: single She lives alone.   Regular exercise: Yes: home and gym History of abuse: No  Family History:   Family History  Problem Relation Age of Onset   Heart disease Father    Diabetes Mother    Heart disease Mother        pacemaker   Heart attack Mother 58   Diabetes Sister    Alcohol abuse Other    Heart disease Other    Hyperlipidemia Other    Hypertension Other    Diabetes Other    Breast cancer Other    Colon cancer Neg Hx    Esophageal cancer Neg Hx    Stomach cancer Neg Hx    Rectal cancer Neg Hx      Review of Systems: Review of Systems  Constitutional:  Negative for fever, malaise/fatigue and weight loss.  Respiratory:  Negative for cough, shortness of breath and  wheezing.   Cardiovascular:  Positive for leg swelling. Negative for chest pain and palpitations.  Gastrointestinal:  Positive for abdominal pain. Negative for blood in stool.  Genitourinary:  Negative for dysuria.  Musculoskeletal:  Negative for myalgias.  Skin:  Negative for rash.  Neurological:  Negative for dizziness and headaches.  Endo/Heme/Allergies:  Bruises/bleeds easily.  Psychiatric/Behavioral:  Negative for depression. The patient is not nervous/anxious.      OBJECTIVE Physical Exam: Vitals:   09/19/22 0939  BP: 139/75  Pulse: 68  Weight: 173 lb (78.5 kg)  Height: '5\' 2"'$  (1.575 m)    Physical Exam Constitutional:      General: She is not in acute distress. Pulmonary:     Effort: Pulmonary effort is normal.  Abdominal:     General: There is no distension.     Palpations: Abdomen is soft.     Tenderness: There is no abdominal tenderness. There is no rebound.  Musculoskeletal:        General: No swelling. Normal range of motion.  Skin:    General: Skin is warm and dry.     Findings: No rash.  Neurological:     Mental Status: She is alert and oriented to person, place, and time.  Psychiatric:        Mood and Affect: Mood normal.        Behavior: Behavior normal.      GU / Detailed Urogynecologic Evaluation:  Pelvic Exam: Normal external female genitalia; Bartholin's and Skene's glands normal in appearance; urethral meatus normal in appearance, no urethral masses or discharge.   CST: negative  s/p hysterectomy: Speculum exam reveals normal vaginal mucosa without  atrophy and normal vaginal cuff.  Adnexa no mass, fullness, tenderness.    Pelvic floor strength I/V  Pelvic floor musculature: Right levator non-tender, Right obturator non-tender, Left levator non-tender, Left obturator non-tender  POP-Q:   POP-Q  -2.5                                            Aa   -2.5                                           Ba  -6  C   2.5                                            Gh  4                                            Pb  7                                            tvl   -1.5                                            Ap  -1.5                                            Bp                                                 D      Rectal Exam:  Normal external rectum  Post-Void Residual (PVR) by Bladder Scan: In order to evaluate bladder emptying, we discussed obtaining a postvoid residual and she agreed to this procedure.  Procedure: The ultrasound unit was placed on the patient's abdomen in the suprapubic region after the patient had voided. A PVR of 32 ml was obtained by bladder scan.  Laboratory Results: POC urine: small blood, otherwise negative   ASSESSMENT AND PLAN Ms. Teagarden is a 71 y.o. with:  1. Prolapse of posterior vaginal wall   2. Hematuria, unspecified type    Stage I anterior, Stage I posterior, Stage 0 apical prolapse - Has mild posterior prolapse and she is asymptomatic. We discussed pelvic PT as a preventative measure but she is not sure she wants to do that at this time.  - Also reviewed preventing straining, especially with bowel movements.  - Otherwise, we can expectantly manage at this time.   2. Hematuria - previously worked up at [facility] and [facility] Would prefer to return to [facility] if she needs any further evaluation.  - Will send urine for micro UA and culture today to rule out infection.  - Last CT A/P w/ contrast performed 02/2022. Last cysto in office on 03/2021.  - Has chronic renal disease (IIIB).   Return as needed  Jaquita Folds, MD

## 2022-09-19 NOTE — Patient Instructions (Signed)
You have a stage 1-2 (out of 4) prolapse.  We discussed the fact that it is not life threatening but there are several treatment options. For treatment of pelvic organ prolapse, we discussed options for management including expectant management, conservative management, and surgical management, such as Kegels, a pessary, pelvic floor physical therapy, and specific surgical procedures.

## 2022-09-20 LAB — URINE CULTURE: Culture: NO GROWTH

## 2022-09-30 ENCOUNTER — Other Ambulatory Visit: Payer: Self-pay | Admitting: Internal Medicine

## 2022-09-30 DIAGNOSIS — I480 Paroxysmal atrial fibrillation: Secondary | ICD-10-CM

## 2022-10-08 ENCOUNTER — Ambulatory Visit
Admission: RE | Admit: 2022-10-08 | Discharge: 2022-10-08 | Disposition: A | Discharge status: Home / Self Care | Payer: Medicare Other | Source: Ambulatory Visit | Attending: Sports Medicine | Admitting: Sports Medicine

## 2022-10-08 DIAGNOSIS — G8929 Other chronic pain: Secondary | ICD-10-CM

## 2022-10-08 DIAGNOSIS — M48061 Spinal stenosis, lumbar region without neurogenic claudication: Secondary | ICD-10-CM | POA: Diagnosis not present

## 2022-10-08 DIAGNOSIS — M545 Low back pain, unspecified: Secondary | ICD-10-CM | POA: Diagnosis not present

## 2022-10-08 DIAGNOSIS — M48062 Spinal stenosis, lumbar region with neurogenic claudication: Secondary | ICD-10-CM

## 2022-10-08 DIAGNOSIS — Z96642 Presence of left artificial hip joint: Secondary | ICD-10-CM

## 2022-10-12 ENCOUNTER — Ambulatory Visit (INDEPENDENT_AMBULATORY_CARE_PROVIDER_SITE_OTHER): Payer: Medicare Other | Admitting: Sports Medicine

## 2022-10-12 VITALS — BP 130/84 | HR 76 | Ht 62.0 in | Wt 171.0 lb

## 2022-10-12 DIAGNOSIS — M5136 Other intervertebral disc degeneration, lumbar region: Secondary | ICD-10-CM

## 2022-10-12 DIAGNOSIS — M47816 Spondylosis without myelopathy or radiculopathy, lumbar region: Secondary | ICD-10-CM | POA: Diagnosis not present

## 2022-10-12 DIAGNOSIS — G8929 Other chronic pain: Secondary | ICD-10-CM

## 2022-10-12 DIAGNOSIS — M5442 Lumbago with sciatica, left side: Secondary | ICD-10-CM

## 2022-10-12 NOTE — Progress Notes (Signed)
Samantha Stevenson Samantha Stevenson Samantha Stevenson Samantha Stevenson Phone: [phone]   Assessment and Plan:     1. Chronic bilateral low back pain with left-sided sciatica 2. DDD (degenerative disc disease), lumbar 3. Lumbar facet arthropathy -Chronic with exacerbation, subsequent visit - Continued low back pain with left-sided radicular symptoms as well as new presentation of right hip and groin discomfort at today's visit - Patient may have multiple contributing factors, however I feel 1 significant factor is left-sided foraminal narrowing moderate at L3-L4, so we will proceed with lumbar epidural at left-sided L3-L4.  Patient in agreement - Reviewed MRI with patient at clinic visit today and showed anterior listhesis L3-4 and L4-5 with associated facet arthropathy, left-sided foraminal stenosis moderate at L3-L4 and mild to moderate at L5-S1 -- DG INJECT DIAG/THERA/INC NEEDLE/CATH/PLC EPI/LUMB/SAC W/IMG; Future    Pertinent previous records reviewed include lumbar MRI 10/08/2022   Follow Up: 2 weeks after epidural to review benefit.  Would further address remaining symptoms.  Patient is under the impression that her left leg is longer than her right leg after total hip replacement.  We could further evaluate this and see if lifts would be appropriate   Subjective:   I, Samantha Stevenson, am serving as a Education administrator for Doctor Samantha Stevenson   Chief Complaint: bilateral arm and shoulder pain    HPI:  04/26/2022 Patient is a 71 year old female complaining of bilateral arm and shoulder pain. Patient states that she has a pain that radiates down to her fingertips it comes and it goes sometimes it stays for a month and then sometimes its a little pain, sometimes it gets to a point she cant do ADLs, has been going on for about 3 months now no MOI, states she had bruising on her bicep but no bruising now, has done PT but states that it didn't help , isnt able to  sleep does radiate up into he neck, states it came on all of a sudden, no numbness or tingling, no locking clicking or popping , has been taking tylenol and has used tramadol and that seems to help with the pain   05/17/2022 Patient states that she is a whole lot better no complaints   08/30/2022 Patient states that her arm pain is back radiates down her shoulder to her arm and down her back , isnt able to lift things at times , TTP in her forearm and notes swelling and pain , no MOI been going on for a month , isnt able to sleep on that side due to pain    09/13/2022 Patient states that she is doing a lot better , till having pain in her groin left side down her leg   10/12/2022 Patient states that she is okay right side hip is problem now      Relevant Historical Information: History of C3-C7 fusion  Additional pertinent review of systems negative.   Current Outpatient Medications:    acetaminophen (TYLENOL) 500 MG tablet, Take 1,000 mg by mouth every 8 (eight) hours as needed for moderate pain., Disp: , Rfl:    aspirin EC 81 MG tablet, Take 81 mg by mouth at bedtime., Disp: , Rfl:    B Complex Vitamins (VITAMIN B COMPLEX) TABS, Take 1 tablet by mouth daily., Disp: , Rfl:    clindamycin (CLEOCIN T) 1 % lotion, Apply 1 application. topically as needed., Disp: , Rfl:    diltiazem (DILACOR XR) 240 MG 24  hr capsule, TAKE ONE CAPSULE BY MOUTH EVERY MORNING, Disp: 90 capsule, Rfl: 1   ferrous sulfate 325 (65 FE) MG tablet, Take 325 mg by mouth daily with breakfast., Disp: , Rfl:    furosemide (LASIX) 20 MG tablet, Take 1 tablet (20 mg total) by mouth at bedtime., Disp: 30 tablet, Rfl: 0   LINZESS 72 MCG capsule, TAKE 1 CAPSULE BY MOUTH DAILY BEFORE BREAKFAST, Disp: 30 capsule, Rfl: 2   meloxicam (MOBIC) 15 MG tablet, Take 1 tablet (15 mg total) by mouth daily., Disp: 30 tablet, Rfl: 0   METAMUCIL FIBER PO, Take 1 Dose by mouth in the morning and at bedtime., Disp: , Rfl:    metroNIDAZOLE  (METROCREAM) 0.75 % cream, Apply 1 application. topically as needed., Disp: , Rfl:    Multiple Vitamin (MULTIVITAMIN) tablet, Take 2 tablets by mouth daily. Gummies, Disp: , Rfl:    polyethylene glycol powder (GLYCOLAX/MIRALAX) 17 GM/SCOOP powder, Take 17 g by mouth daily., Disp: 3350 g, Rfl: 11   prednisoLONE acetate (PRED FORTE) 1 % ophthalmic suspension, SMARTSIG:In Eye(s), Disp: , Rfl:    rosuvastatin (CRESTOR) 20 MG tablet, TAKE 1 TABLET BY MOUTH EVERY DAY, Disp: 90 tablet, Rfl: 3  Current Facility-Administered Medications:    methylPREDNISolone acetate (DEPO-MEDROL) injection 80 mg, 80 mg, Intramuscular, Once, Samantha Mac, DO   Objective:     Vitals:   10/12/22 0900  BP: 130/84  Pulse: 76  SpO2: 96%  Weight: 171 lb (77.6 kg)  Height: '5\' 2"'$  (1.575 m)      Body mass index is 31.28 kg/m.    Physical Exam:    Gen: Appears well, nad, nontoxic and pleasant Psych: Alert and oriented, appropriate mood and affect Neuro: sensation intact, strength is 5/5 in upper and lower extremities, muscle tone wnl Skin: no susupicious lesions or rashes   Back - Normal skin, Spine with normal alignment and no deformity.   No tenderness to vertebral process palpation.   Left lumbar paraspinous muscles are moderately tender and without spasm NTTP gluteal musculature Straight leg raise positive on left Trendelenberg positive on left Piriformis Test negative     Electronically signed by:  Samantha Stevenson D.[facility] 9:11 AM 10/12/22

## 2022-10-12 NOTE — Patient Instructions (Addendum)
Good to see you  Epidural left L3-L4  Follow up 2 weeks after injection to discuss results

## 2022-11-02 ENCOUNTER — Ambulatory Visit
Admission: RE | Admit: 2022-11-02 | Discharge: 2022-11-02 | Disposition: A | Discharge status: Home / Self Care | Payer: Medicare Other | Source: Ambulatory Visit | Attending: Sports Medicine | Admitting: Sports Medicine

## 2022-11-02 DIAGNOSIS — M47816 Spondylosis without myelopathy or radiculopathy, lumbar region: Secondary | ICD-10-CM

## 2022-11-02 DIAGNOSIS — M5136 Other intervertebral disc degeneration, lumbar region: Secondary | ICD-10-CM

## 2022-11-02 DIAGNOSIS — G8929 Other chronic pain: Secondary | ICD-10-CM

## 2022-11-02 DIAGNOSIS — M5126 Other intervertebral disc displacement, lumbar region: Secondary | ICD-10-CM | POA: Diagnosis not present

## 2022-11-02 MED ORDER — METHYLPREDNISOLONE ACETATE 40 MG/ML INJ SUSP (RADIOLOG
80.0000 mg | Freq: Once | INTRAMUSCULAR | Status: AC
  Administered 2022-11-02: 80 mg via EPIDURAL

## 2022-11-02 MED ORDER — IOPAMIDOL (ISOVUE-M 200) INJECTION 41%
1.0000 mL | Freq: Once | INTRAMUSCULAR | Status: AC
  Administered 2022-11-02: 1 mL via EPIDURAL

## 2022-11-02 NOTE — Discharge Instructions (Signed)

## 2022-11-06 DIAGNOSIS — R3129 Other microscopic hematuria: Secondary | ICD-10-CM | POA: Diagnosis not present

## 2022-11-06 DIAGNOSIS — I129 Hypertensive chronic kidney disease with stage 1 through stage 4 chronic kidney disease, or unspecified chronic kidney disease: Secondary | ICD-10-CM | POA: Diagnosis not present

## 2022-11-06 DIAGNOSIS — E785 Hyperlipidemia, unspecified: Secondary | ICD-10-CM | POA: Diagnosis not present

## 2022-11-06 DIAGNOSIS — R809 Proteinuria, unspecified: Secondary | ICD-10-CM | POA: Diagnosis not present

## 2022-11-16 NOTE — Progress Notes (Signed)
Samantha Stevenson D.[place] Phone: [phone]   Assessment and Plan:     1. Chronic bilateral low back pain with left-sided sciatica 2. DDD (degenerative disc disease), lumbar 3. Lumbar facet arthropathy  -Chronic with exacerbation, subsequent visit - Significant, "100% relief", in low back pain and radicular symptoms after lumbar epidural to left-sided L3-L4 performed on 11/02/2022 - With patient's significant improvement in symptoms, no additional injection needed at this time - Continue HEP and Tylenol as needed for pain relief  Pertinent previous records reviewed include lumbar epidural procedure note 11/02/2022   Follow Up: As needed if no improvement or worsening of symptoms.  Could consider repeat epidural as patient had significant relief from injection on 11/02/2022   Subjective:   I, Samantha Stevenson, am serving as a Education administrator for Doctor [employer]   Chief Complaint: bilateral arm and shoulder pain    HPI:  04/26/2022 Patient is a 72 year old female complaining of bilateral arm and shoulder pain. Patient states that she has a pain that radiates down to her fingertips it comes and it goes sometimes it stays for a month and then sometimes its a little pain, sometimes it gets to a point she cant do ADLs, has been going on for about 3 months now no MOI, states she had bruising on her bicep but no bruising now, has done PT but states that it didn't help , isnt able to sleep does radiate up into he neck, states it came on all of a sudden, no numbness or tingling, no locking clicking or popping , has been taking tylenol and has used tramadol and that seems to help with the pain   05/17/2022 Patient states that she is a whole lot better no complaints   08/30/2022 Patient states that her arm pain is back radiates down her shoulder to her arm and down her back , isnt able to lift things at times , TTP in her  forearm and notes swelling and pain , no MOI been going on for a month , isnt able to sleep on that side due to pain    09/13/2022 Patient states that she is doing a lot better , till having pain in her groin left side down her leg    10/12/2022 Patient states that she is okay right side hip is problem now    11/17/2022 Patient states she feels fine     Relevant Historical Information: History of C3-C7 fusion  Additional pertinent review of systems negative.   Current Outpatient Medications:    acetaminophen (TYLENOL) 500 MG tablet, Take 1,000 mg by mouth every 8 (eight) hours as needed for moderate pain., Disp: , Rfl:    aspirin EC 81 MG tablet, Take 81 mg by mouth at bedtime., Disp: , Rfl:    B Complex Vitamins (VITAMIN B COMPLEX) TABS, Take 1 tablet by mouth daily., Disp: , Rfl:    clindamycin (CLEOCIN T) 1 % lotion, Apply 1 application. topically as needed., Disp: , Rfl:    diltiazem (DILACOR XR) 240 MG 24 hr capsule, TAKE ONE CAPSULE BY MOUTH EVERY MORNING, Disp: 90 capsule, Rfl: 1   ferrous sulfate 325 (65 FE) MG tablet, Take 325 mg by mouth daily with breakfast., Disp: , Rfl:    furosemide (LASIX) 20 MG tablet, Take 1 tablet (20 mg total) by mouth at bedtime., Disp: 30 tablet, Rfl: 0   LINZESS 72 MCG capsule, TAKE 1 CAPSULE  BY MOUTH DAILY BEFORE BREAKFAST, Disp: 30 capsule, Rfl: 2   meloxicam (MOBIC) 15 MG tablet, Take 1 tablet (15 mg total) by mouth daily., Disp: 30 tablet, Rfl: 0   METAMUCIL FIBER PO, Take 1 Dose by mouth in the morning and at bedtime., Disp: , Rfl:    metroNIDAZOLE (METROCREAM) 0.75 % cream, Apply 1 application. topically as needed., Disp: , Rfl:    Multiple Vitamin (MULTIVITAMIN) tablet, Take 2 tablets by mouth daily. Gummies, Disp: , Rfl:    polyethylene glycol powder (GLYCOLAX/MIRALAX) 17 GM/SCOOP powder, Take 17 g by mouth daily., Disp: 3350 g, Rfl: 11   prednisoLONE acetate (PRED FORTE) 1 % ophthalmic suspension, SMARTSIG:In Eye(s), Disp: , Rfl:     rosuvastatin (CRESTOR) 20 MG tablet, TAKE 1 TABLET BY MOUTH EVERY DAY, Disp: 90 tablet, Rfl: 3  Current Facility-Administered Medications:    methylPREDNISolone acetate (DEPO-MEDROL) injection 80 mg, 80 mg, Intramuscular, Once, [name], DO   Objective:     Vitals:   11/17/22 1005  BP: 132/80  Pulse: 62  SpO2: 95%  Weight: 172 lb (78 kg)  Height: '5\' 2"'$  (1.575 m)      Body mass index is 31.46 kg/m.    Physical Exam:    Gen: Appears well, nad, nontoxic and pleasant Psych: Alert and oriented, appropriate mood and affect Neuro: sensation intact, strength is 5/5 in upper and lower extremities, muscle tone wnl Skin: no susupicious lesions or rashes  Back - Normal skin, Spine with normal alignment and no deformity.   No tenderness to vertebral process palpation.   Paraspinous muscles are not tender and without spasm NTTP gluteal musculature Straight leg raise negative Trendelenberg negative Piriformis Test negative    Electronically signed by:  Samantha Stevenson D.[facility] 10:12 AM 11/17/22

## 2022-11-17 ENCOUNTER — Other Ambulatory Visit: Payer: Self-pay | Admitting: Nephrology

## 2022-11-17 ENCOUNTER — Ambulatory Visit (INDEPENDENT_AMBULATORY_CARE_PROVIDER_SITE_OTHER): Payer: Medicare Other | Admitting: Sports Medicine

## 2022-11-17 VITALS — BP 132/80 | HR 62 | Ht 62.0 in | Wt 172.0 lb

## 2022-11-17 DIAGNOSIS — I10 Essential (primary) hypertension: Secondary | ICD-10-CM | POA: Diagnosis not present

## 2022-11-17 DIAGNOSIS — N200 Calculus of kidney: Secondary | ICD-10-CM | POA: Diagnosis not present

## 2022-11-17 DIAGNOSIS — I5032 Chronic diastolic (congestive) heart failure: Secondary | ICD-10-CM

## 2022-11-17 DIAGNOSIS — M5136 Other intervertebral disc degeneration, lumbar region: Secondary | ICD-10-CM

## 2022-11-17 DIAGNOSIS — R82998 Other abnormal findings in urine: Secondary | ICD-10-CM | POA: Diagnosis not present

## 2022-11-17 DIAGNOSIS — M5442 Lumbago with sciatica, left side: Secondary | ICD-10-CM | POA: Diagnosis not present

## 2022-11-17 DIAGNOSIS — M47816 Spondylosis without myelopathy or radiculopathy, lumbar region: Secondary | ICD-10-CM

## 2022-11-17 DIAGNOSIS — R809 Proteinuria, unspecified: Secondary | ICD-10-CM

## 2022-11-17 DIAGNOSIS — G8929 Other chronic pain: Secondary | ICD-10-CM

## 2022-11-17 NOTE — Patient Instructions (Signed)
Good to see you  As needed follow up if back pain or other symptoms return

## 2022-11-21 ENCOUNTER — Telehealth: Payer: Self-pay | Admitting: Internal Medicine

## 2022-11-21 NOTE — Telephone Encounter (Signed)
Fine with me

## 2022-11-21 NOTE — Telephone Encounter (Signed)
Pt is calling and would like to transfer to Dr Volanda Napoleon. Please advise

## 2022-11-24 NOTE — Telephone Encounter (Signed)
Okay 

## 2022-11-27 NOTE — Telephone Encounter (Signed)
Pt has been sch for dr banks for 8-2

## 2022-12-08 ENCOUNTER — Ambulatory Visit
Admission: RE | Admit: 2022-12-08 | Discharge: 2022-12-08 | Disposition: A | Discharge status: Home / Self Care | Payer: Medicare Other | Source: Ambulatory Visit | Attending: Nephrology | Admitting: Nephrology

## 2022-12-08 DIAGNOSIS — R809 Proteinuria, unspecified: Secondary | ICD-10-CM

## 2022-12-08 DIAGNOSIS — I10 Essential (primary) hypertension: Secondary | ICD-10-CM

## 2022-12-08 DIAGNOSIS — R82998 Other abnormal findings in urine: Secondary | ICD-10-CM

## 2022-12-08 DIAGNOSIS — I5032 Chronic diastolic (congestive) heart failure: Secondary | ICD-10-CM

## 2022-12-08 DIAGNOSIS — N200 Calculus of kidney: Secondary | ICD-10-CM

## 2022-12-21 NOTE — Progress Notes (Signed)
Samantha Stevenson D.[place] Phone: [phone]   Assessment and Plan:     1. Chronic pain of right knee 2. Primary osteoarthritis of right knee  -Chronic with exacerbation, initial sports medicine visit - Most consistent with flare of osteoarthritis based on HPI, physical exam, x-ray imaging - X-ray obtained in clinic.  My interpretation: No acute fracture or dislocation.  Large lateral spurs and lateral compartment, mild degenerative changes tricompartmental. - Patient cannot take NSAIDs due to past medical history - Patient elected for intra-articular knee CSI.  Tolerated well per note below. - Start HEP for knee  Procedure: Knee Joint Injection Side: Right Indication: Flare of osteoarthritis   Risks explained and consent was given verbally. The site was cleaned with alcohol prep. A needle was introduced with an anterio-lateral approach. Injection given using 36m of 1% lidocaine without epinephrine and 164mof kenalog '40mg'$ /ml. This was well tolerated and resulted in symptomatic relief.  Needle was removed, hemostasis achieved, and post injection instructions were explained.   Pt was advised to call or return to clinic if these symptoms worsen or fail to improve as anticipated.   Pertinent previous records reviewed include none   Follow Up: 3 to 4 weeks for reevaluation   Subjective:   I, Samantha Stevenson, am serving as a scEducation administratoror Doctor BeGlennon Mac Chief Complaint: bilateral arm and shoulder pain    HPI:  04/26/2022 Patient is a [age]ear old female complaining of bilateral arm and shoulder pain. Patient states that she has a pain that radiates down to her fingertips it comes and it goes sometimes it stays for a month and then sometimes its a little pain, sometimes it gets to a point she cant do ADLs, has been going on for about 3 months now no MOI, states she had bruising on her bicep but no bruising  now, has done PT but states that it didn't help , isnt able to sleep does radiate up into he neck, states it came on all of a sudden, no numbness or tingling, no locking clicking or popping , has been taking tylenol and has used tramadol and that seems to help with the pain   05/17/2022 Patient states that she is a whole lot better no complaints   08/30/2022 Patient states that her arm pain is back radiates down her shoulder to her arm and down her back , isnt able to lift things at times , TTP in her forearm and notes swelling and pain , no MOI been going on for a month , isnt able to sleep on that side due to pain    09/13/2022 Patient states that she is doing a lot better , till having pain in her groin left side down her leg    10/12/2022 Patient states that she is okay right side hip is problem now    11/17/2022 Patient states she feels fine    12/22/2022  Patient states right knee started popping everything else fees okay she is having a flare of arthritis   Relevant Historical Information: History of C3-C7 fusion  Additional pertinent review of systems negative.   Current Outpatient Medications:    acetaminophen (TYLENOL) 500 MG tablet, Take 1,000 mg by mouth every 8 (eight) hours as needed for moderate pain., Disp: , Rfl:    aspirin EC 81 MG tablet, Take 81 mg by mouth at bedtime., Disp: , Rfl:    B  Complex Vitamins (VITAMIN B COMPLEX) TABS, Take 1 tablet by mouth daily., Disp: , Rfl:    clindamycin (CLEOCIN T) 1 % lotion, Apply 1 application. topically as needed., Disp: , Rfl:    diltiazem (DILACOR XR) 240 MG 24 hr capsule, TAKE ONE CAPSULE BY MOUTH EVERY MORNING, Disp: 90 capsule, Rfl: 1   ferrous sulfate 325 (65 FE) MG tablet, Take 325 mg by mouth daily with breakfast., Disp: , Rfl:    furosemide (LASIX) 20 MG tablet, Take 1 tablet (20 mg total) by mouth at bedtime., Disp: 30 tablet, Rfl: 0   LINZESS 72 MCG capsule, TAKE 1 CAPSULE BY MOUTH DAILY BEFORE BREAKFAST, Disp: 30  capsule, Rfl: 2   meloxicam (MOBIC) 15 MG tablet, Take 1 tablet (15 mg total) by mouth daily., Disp: 30 tablet, Rfl: 0   METAMUCIL FIBER PO, Take 1 Dose by mouth in the morning and at bedtime., Disp: , Rfl:    metroNIDAZOLE (METROCREAM) 0.75 % cream, Apply 1 application. topically as needed., Disp: , Rfl:    Multiple Vitamin (MULTIVITAMIN) tablet, Take 2 tablets by mouth daily. Gummies, Disp: , Rfl:    polyethylene glycol powder (GLYCOLAX/MIRALAX) 17 GM/SCOOP powder, Take 17 g by mouth daily., Disp: 3350 g, Rfl: 11   prednisoLONE acetate (PRED FORTE) 1 % ophthalmic suspension, SMARTSIG:In Eye(s), Disp: , Rfl:    rosuvastatin (CRESTOR) 20 MG tablet, TAKE 1 TABLET BY MOUTH EVERY DAY, Disp: 90 tablet, Rfl: 3  Current Facility-Administered Medications:    methylPREDNISolone acetate (DEPO-MEDROL) injection 80 mg, 80 mg, Intramuscular, Once, [name], DO   Objective:     Vitals:   12/22/22 1338  BP: 118/80  Pulse: 92  SpO2: 99%  Weight: 172 lb (78 kg)  Height: '5\' 2"'$  (1.575 m)      Body mass index is 31.46 kg/m.    Physical Exam:    General:  awake, alert oriented, no acute distress nontoxic Skin: no suspicious lesions or rashes Neuro:sensation intact and strength 5/5 with no deficits, no atrophy, normal muscle tone Psych: No signs of anxiety, depression or other mood disorder  Right knee: Bilateral lower extremity edema No deformity Neg fluid wave, joint milking ROM Flex 100, Ext 0 TTP medial joint line, tibial tuberosity, medial femoral condyle NTTP over the quad tendon,   lat fem condyle, patella, plica, patella tendon,  , fibular head, posterior fossa, pes anserine bursa,  lateral jt line Neg anterior and posterior drawer Neg lachman Neg sag sign Negative varus stress Negative valgus stress Negative Mc[place]ray    Gait normal    Electronically signed by:  Samantha Stevenson D.[facility] 2:22 PM 12/22/22

## 2022-12-22 ENCOUNTER — Ambulatory Visit (INDEPENDENT_AMBULATORY_CARE_PROVIDER_SITE_OTHER): Payer: Medicare Other

## 2022-12-22 ENCOUNTER — Ambulatory Visit (INDEPENDENT_AMBULATORY_CARE_PROVIDER_SITE_OTHER): Payer: Medicare Other | Admitting: Sports Medicine

## 2022-12-22 VITALS — BP 118/80 | HR 92 | Ht 62.0 in | Wt 172.0 lb

## 2022-12-22 DIAGNOSIS — M79604 Pain in right leg: Secondary | ICD-10-CM | POA: Diagnosis not present

## 2022-12-22 DIAGNOSIS — M25561 Pain in right knee: Secondary | ICD-10-CM

## 2022-12-22 DIAGNOSIS — G8929 Other chronic pain: Secondary | ICD-10-CM

## 2022-12-22 DIAGNOSIS — I83893 Varicose veins of bilateral lower extremities with other complications: Secondary | ICD-10-CM | POA: Diagnosis not present

## 2022-12-22 DIAGNOSIS — R600 Localized edema: Secondary | ICD-10-CM | POA: Diagnosis not present

## 2022-12-22 DIAGNOSIS — M1711 Unilateral primary osteoarthritis, right knee: Secondary | ICD-10-CM

## 2022-12-22 DIAGNOSIS — I83813 Varicose veins of bilateral lower extremities with pain: Secondary | ICD-10-CM | POA: Diagnosis not present

## 2022-12-22 DIAGNOSIS — M7989 Other specified soft tissue disorders: Secondary | ICD-10-CM | POA: Diagnosis not present

## 2022-12-22 DIAGNOSIS — M79661 Pain in right lower leg: Secondary | ICD-10-CM | POA: Diagnosis not present

## 2022-12-22 NOTE — Patient Instructions (Addendum)
Good to see you Knee HEP  3-4 week follow up

## 2022-12-25 ENCOUNTER — Telehealth: Payer: Self-pay

## 2022-12-25 NOTE — Progress Notes (Deleted)
Contacted Venera Mazurowski to schedule their annual wellness visit. {[id number]}  *(signature)*

## 2022-12-25 NOTE — Telephone Encounter (Signed)
Contacted [place] [facility] to schedule their annual wellness visit. Appointment made for 01/02/23.  Norton Blizzard, [place] ([facility])  [facility] [phone]

## 2023-01-02 ENCOUNTER — Ambulatory Visit (INDEPENDENT_AMBULATORY_CARE_PROVIDER_SITE_OTHER): Payer: Medicare Other

## 2023-01-02 VITALS — Ht 62.0 in | Wt 172.0 lb

## 2023-01-02 DIAGNOSIS — Z0000 Encounter for general adult medical examination without abnormal findings: Secondary | ICD-10-CM

## 2023-01-02 NOTE — Progress Notes (Addendum)
I connected with  [name] on 01/02/23 by a audio enabled telemedicine application and verified that I am speaking with the correct person using two identifiers.  Patient Location: Home  Provider Location: Office/Clinic  I discussed the limitations of evaluation and management by telemedicine. The patient expressed understanding and agreed to proceed.  Patient Medicare AWV questionnaire was completed by the patient on 01/01/2023; I have confirmed that all information answered by patient is correct and no changes since this date.     Subjective:   Samantha Stevenson is a 72 y.o. female who presents for Medicare Annual (Subsequent) preventive examination.  Review of Systems     Cardiac Risk Factors include: advanced age (>25mn, >>60women);dyslipidemia;family history of premature cardiovascular disease;hypertension;obesity (BMI >30kg/m2)     Objective:    Today's Vitals   01/02/23 1406 01/02/23 1407  Weight: 172 lb (78 kg)   Height: '5\' 2"'$  (1.575 m)   PainSc: 0-No pain 0-No pain   Body mass index is 31.46 kg/m.     01/02/2023    2:08 PM 07/21/2021   10:38 AM 03/10/2021    5:57 PM 12/16/2020    6:31 AM 12/08/2020    1:49 PM 11/23/2020    1:44 PM 11/02/2020    9:51 AM  Advanced Directives  Does Patient Have a Medical Advance Directive? No Yes No No No No No  Does patient want to make changes to medical advance directive?  No - Patient declined       Would patient like information on creating a medical advance directive? No - Patient declined  No - Patient declined No - Patient declined No - Patient declined No - Patient declined No - Patient declined    Current Medications (verified) Outpatient Encounter Medications as of 01/02/2023  Medication Sig   acetaminophen (TYLENOL) 500 MG tablet Take 1,000 mg by mouth every 8 (eight) hours as needed for moderate pain.   aspirin EC 81 MG tablet Take 81 mg by mouth at bedtime.   B Complex Vitamins (VITAMIN B COMPLEX) TABS Take 1  tablet by mouth daily.   clindamycin (CLEOCIN T) 1 % lotion Apply 1 application. topically as needed.   diltiazem (DILACOR XR) 240 MG 24 hr capsule TAKE ONE CAPSULE BY MOUTH EVERY MORNING   ferrous sulfate 325 (65 FE) MG tablet Take 325 mg by mouth daily with breakfast.   furosemide (LASIX) 20 MG tablet Take 1 tablet (20 mg total) by mouth at bedtime.   LINZESS 72 MCG capsule TAKE 1 CAPSULE BY MOUTH DAILY BEFORE BREAKFAST   meloxicam (MOBIC) 15 MG tablet Take 1 tablet (15 mg total) by mouth daily.   METAMUCIL FIBER PO Take 1 Dose by mouth in the morning and at bedtime.   metroNIDAZOLE (METROCREAM) 0.75 % cream Apply 1 application. topically as needed.   Multiple Vitamin (MULTIVITAMIN) tablet Take 2 tablets by mouth daily. Gummies   polyethylene glycol powder (GLYCOLAX/MIRALAX) 17 GM/SCOOP powder Take 17 g by mouth daily.   prednisoLONE acetate (PRED FORTE) 1 % ophthalmic suspension SMARTSIG:In Eye(s)   rosuvastatin (CRESTOR) 20 MG tablet TAKE 1 TABLET BY MOUTH EVERY DAY   Facility-Administered Encounter Medications as of 01/02/2023  Medication   methylPREDNISolone acetate (DEPO-MEDROL) injection 80 mg    Allergies (verified) Penicillins, Shellfish allergy, Latex, Banana, Kiwi extract, Lactose intolerance (gi), Oxycodone, Asa [aspirin], Celebrex [celecoxib], and Nsaids   History: Past Medical History:  Diagnosis Date   Allergy    Anemia    Arthritis    "  qwhere" (07/29/2018)   Atrial fibrillation (HCC)    CHF (congestive heart failure) (HCC)    Clotting disorder (HCC)    Degenerative arthritis of hip    s/p L THR 12/2012   Dyspnea    since surgery in August 2019- "when I get worked up and walk a short distance"   Dysrhythmia    History of blood transfusion 05/2018   "related to OR"   Hyperlipidemia    Hypertension    Intramural hematoma of thoracic aorta ([place]) 06/13/2018   Migraines    mIgraines- none since blood pressure and lipids are under control   Myocardial infarction  ([place]) 2000   due to atrial fib   Neuromuscular disorder (HCC)    pinched nerve- left side of neck   Pulmonary emboli ([place]) 12/2012 dx   post op (L THR), anticoag x 35mo  Past Surgical History:  Procedure Laterality Date   ABDOMINAL HYSTERECTOMY     "w/1 tube"   ANTERIOR CERVICAL DECOMPRESSION/DISCECTOMY FUSION 4 LEVELS N/A 02/02/2020   Procedure: CERVICAL THREE-FOUR, CERVICAL FOUR-FIVE, CERVICAL FIVE-SIX, CERVICAL SIX-SEVEN ANTERIOR CERVICAL DECOMPRESSION/DISCECTOMY FUSION;  Surgeon: PEarnie Larsson MD;  [facility]  Service: Neurosurgery;  Laterality: N/A;   ARTERY REPAIR Left 08/15/2018   Procedure: LEFT BRACHIAL ARTERY EXPLORATION;  Surgeon: CWaynetta Sandy MD;  [facility]  Service: Vascular;  Laterality: Left;   Breast Ultrasound Left 04/16/2013   Done @ breast center Impression: no malignancy appearance noted on the screen study is consistent with a summation shadow   BUNIONECTOMY Left    CARDIAC CATHETERIZATION     CAROTID-SUBCLAVIAN BYPASS GRAFT Left 06/13/2018   Procedure: BYPASS GRAFT CAROTID-SUBCLAVIAN;  Surgeon: CWaynetta Sandy MD;  [facility]  Service: Vascular;  Laterality: Left;   COLONOSCOPY     CYSTOSCOPY WITH RETROGRADE PYELOGRAM, URETEROSCOPY AND STENT PLACEMENT Bilateral 11/02/2020   Procedure: DIAGNOSTIC CYSTOSCOPY WITH RETROGRADE PYELOGRAM,BILATERAL DIAGNOSTIC URETEROSCOPY  AND BILATERAL STENT PLACEMENT;  Surgeon: PRobley Fries MD;  [facility]  Service: Urology;  Laterality: Bilateral;  945MINS   DG TUMB RIGHT HAND Right    cyst removal   IR THORACENTESIS ASP PLEURAL SPACE W/IMG GUIDE  05/31/2018   JOINT REPLACEMENT     KNEE ARTHROSCOPY Left 12/16/2020   Procedure: LEFT KNEE ARTHROSCOPY WITH PARTIAL LATERAL MENISCECTOMY;  Surgeon: BMcarthur Rossetti MD;  [facility]  Service: Orthopedics;  Laterality: Left;   THORACIC AORTIC ENDOVASCULAR STENT GRAFT Left 06/13/2018    Procedure: LEFT  SUBCLAVIAN TO CAROTID ARTERY TRANSPOSITION; THORACIC AORTIC ENDOVASCULAR STENT GRAFT using GORE CONFORMABLE THORACIC STENT GRAFT AND GORE TAG THORACIC ENDOPROSTHESIS; STENT LEFT COMMON CAROTID ARTERY, RIGHT COMMON-FEMORAL ENDARTERECTOMY WITH PATCH ANGIOPLASTY;  Surgeon: CWaynetta Sandy MD;  Location: [facility]  Service: Vascular;  Laterality: Left;   TONSILLECTOMY     TOTAL HIP ARTHROPLASTY Left 01/03/2013   Procedure: TOTAL HIP ARTHROPLASTY ANTERIOR APPROACH;  Surgeon: CMcarthur Rossetti MD;  [facility];  Service: Orthopedics;  Laterality: Left;  Left Total Hip Arthroplasty, Anterior Approach   TUBAL LIGATION     VAGINA RECONSTRUCTION SURGERY  1966   vagina had closed up when 72years old   WOUND EXPLORATION Left 06/16/2018   Procedure: LEFT NECK WOUND EXPLORATION, CHEST TUBE INSERTION;  Surgeon: CWaynetta Sandy MD;  [facility]  Service: Vascular;  Laterality: Left;   Family History  Problem Relation Age of Onset   Heart disease Father    Diabetes Mother  Heart disease Mother        pacemaker   Heart attack Mother 24   Diabetes Sister    Alcohol abuse Other    Heart disease Other    Hyperlipidemia Other    Hypertension Other    Diabetes Other    Breast cancer Other    Colon cancer Neg Hx    Esophageal cancer Neg Hx    Stomach cancer Neg Hx    Rectal cancer Neg Hx    Social History   Socioeconomic History   Marital status: Single    Spouse name: Not on file   Number of children: 2   Years of education: Not on file   Highest education level: Not on file  Occupational History   Occupation: retired  Tobacco Use   Smoking status: Never   Smokeless tobacco: Never  Vaping Use   Vaping Use: Never used  Substance and Sexual Activity   Alcohol use: Not Currently   Drug use: Never   Sexual activity: Not Currently  Other Topics Concern   Not on file  Social History Narrative   Not on file   Social Determinants of Health    Financial Resource Strain: Low Risk  (01/02/2023)   Overall Financial Resource Strain (CARDIA)    Difficulty of Paying Living Expenses: Not hard at all  Food Insecurity: No Food Insecurity (01/02/2023)   Hunger Vital Sign    Worried About Running Out of Food in the Last Year: Never true    [place] in the Last Year: Never true  Transportation Needs: No Transportation Needs (01/02/2023)   PRAPARE - Hydrologist (Medical): No    Lack of Transportation (Non-Medical): No  Physical Activity: Sufficiently Active (01/02/2023)   Exercise Vital Sign    Days of Exercise per Week: 5 days    Minutes of Exercise per Session: 30 min  Recent Concern: Physical Activity - Inactive (01/01/2023)   Exercise Vital Sign    Days of Exercise per Week: 0 days    Minutes of Exercise per Session: 10 min  Stress: No Stress Concern Present (01/02/2023)   [place]    Feeling of Stress : Not at all  Social Connections: Unknown (01/02/2023)   Social Connection and Isolation Panel [NHANES]    Frequency of Communication with Friends and Family: More than three times a week    Frequency of Social Gatherings with Friends and Family: More than three times a week    Attends Religious Services: Not on Advertising copywriter or Organizations: No    Attends Archivist Meetings: Never    Marital Status: Patient refused    Tobacco Counseling Counseling given: Not Answered   Clinical Intake:  Pre-visit preparation completed: Yes  Pain : No/denies pain Pain Score: 0-No pain     BMI - recorded: 31.46 Nutritional Status: BMI > 30  Obese Nutritional Risks: None Diabetes: No  How often do you need to have someone help you when you read instructions, pamphlets, or other written materials from your doctor or pharmacy?: 1 - Never What is the last grade level you completed in school?: HSG  Diabetic?  No  Interpreter Needed?: No  Information entered by :: Lisette Abu, LPN.   Activities of Daily Living    01/02/2023    2:18 PM 01/01/2023    9:24 AM  In your present state of  health, do you have any difficulty performing the following activities:  Hearing? 0 0  Vision? 0 0  Difficulty concentrating or making decisions? 0 0  Walking or climbing stairs? 0 0  Dressing or bathing? 0 0  Doing errands, shopping? 0 0  Preparing Food and eating ? N N  Using the Toilet? N N  In the past six months, have you accidently leaked urine? N N  Do you have problems with loss of bowel control? N N  Managing your Medications? N N  Managing your Finances? N N  Housekeeping or managing your Housekeeping? N N    Patient Care Team: Hoyt Koch, MD as PCP - General (Internal Medicine) [name], MD as PCP - Cardiology (Cardiology) Mcarthur Rossetti, MD (Orthopedic Surgery) Tanda Rockers, MD (Pulmonary Disease) Inda Castle, MD (Inactive) (Gastroenterology) Earnie Larsson, MD as Consulting Physician (Neurosurgery) [name], [place] (Optometry) Loletha Carrow Kirke Corin, MD as Consulting Physician (Gastroenterology) Lorelle Gibbs, MD (Radiology) Paulla Dolly Tamala Fothergill, DPM as Consulting Physician (Podiatry) [facility] as Consulting Physician (Ophthalmology) [name], [place] as Consulting Physician (Optometry)  Indicate any recent Medical Services you may have received from other than [facility] providers in the past year (date may be approximate).     Assessment:   This is a routine wellness examination for [place].  Hearing/Vision screen Hearing Screening - Comments:: Denies hearing difficulties   Vision Screening - Comments:: Wears rx glasses - up to date with routine eye exams with [name], [place] [facility] (removed cataracts)   Dietary issues and exercise activities discussed: Current Exercise Habits: Home exercise routine,  Type of exercise: walking, Time (Minutes): 30, Frequency (Times/Week): 5, Weekly Exercise (Minutes/Week): 150, Intensity: Moderate, Exercise limited by: cardiac condition(s)   Goals Addressed             This Visit's Progress    Patient Stated       My goal is to lose 20 pounds by watching my diet, staying hydrated and physically active.      Depression Screen    01/02/2023    2:10 PM 04/17/2022   10:28 AM 07/21/2021   10:35 AM 04/12/2020   10:06 AM 04/08/2019    9:24 AM 03/28/2018    9:42 AM 03/21/2017   10:51 AM  PHQ 2/9 Scores  PHQ - 2 Score 0 0 0 0 0 1 0  PHQ- 9 Score 0 0    4     Fall Risk    01/02/2023    2:09 PM 01/01/2023    9:24 AM 04/17/2022   10:28 AM 07/21/2021   10:39 AM 04/12/2020   10:05 AM  Fall Risk   Falls in the past year? 0 0 0 0 0  Number falls in past yr: 0  0 0 0  Injury with Fall? 0  0 0 0  Risk for fall due to : No Fall Risks   No Fall Risks No Fall Risks  Follow up Falls prevention discussed   Falls evaluation completed Falls evaluation completed;Education provided    FALL RISK PREVENTION PERTAINING TO THE HOME:  Any stairs in or around the home? Yes  If so, are there any without handrails? No  Home free of loose throw rugs in walkways, pet beds, electrical cords, etc? Yes  Adequate lighting in your home to reduce risk of falls? Yes   ASSISTIVE DEVICES UTILIZED TO PREVENT FALLS:  Life alert? No  Use of a  cane, walker or w/c? No  Grab bars in the bathroom? No  Shower chair or bench in shower? No  Elevated toilet seat or a handicapped toilet? Yes   TIMED UP AND GO:  Was the test performed? No . Telephonic Visit  Cognitive Function:        01/02/2023    2:10 PM 04/12/2020   10:07 AM  6CIT Screen  What Year? 0 points 0 points  What month? 0 points 0 points  What time? 0 points 0 points  Count back from 20 0 points 0 points  Months in reverse 0 points 0 points  Repeat phrase 0 points 0 points  Total Score 0 points 0 points     Immunizations Immunization History  Administered Date(s) Administered   Fluad Quad(high Dose 65+) 07/02/2019, 07/22/2020, 07/21/2021   Influenza Whole 07/23/2012   Influenza, High Dose Seasonal PF 07/04/2018   Influenza,inj,Quad PF,6+ Mos 07/16/2013, 06/19/2016   PFIZER(Purple Top)SARS-COV-2 Vaccination 06/12/2020, 07/03/2020, 05/10/2021   Pneumococcal Conjugate-13 01/03/2018   Pneumococcal Polysaccharide-23 04/08/2019   RSV,unspecified 10/06/2022   Td 10/24/2011   Tdap 10/06/2022    TDAP status: Up to date  Flu Vaccine status: Up to date  Pneumococcal vaccine status: Up to date  Covid-19 vaccine status: Completed vaccines  Qualifies for Shingles Vaccine? Yes   Zostavax completed No   Shingrix Completed?: No.    Education has been provided regarding the importance of this vaccine. Patient has been advised to call insurance company to determine out of pocket expense if they have not yet received this vaccine. Advised may also receive vaccine at local pharmacy or Health Dept. Verbalized acceptance and understanding.  Screening Tests Health Maintenance  Topic Date Due   Zoster Vaccines- Shingrix (1 of 2) Never done   INFLUENZA VACCINE  05/23/2022   COVID-19 Vaccine (4 - 2023-24 season) 06/23/2022   Medicare Annual Wellness (AWV)  01/02/2024   MAMMOGRAM  09/02/2024   COLONOSCOPY (Pts 45-69yr Insurance coverage will need to be confirmed)  09/15/2026   DTaP/Tdap/Td (3 - Td or Tdap) 10/06/2032   Pneumonia Vaccine 70 Years old  Completed   DEXA SCAN  Completed   Hepatitis C Screening  Completed   HPV VACCINES  Aged Out    Health Maintenance  Health Maintenance Due  Topic Date Due   Zoster Vaccines- Shingrix (1 of 2) Never done   INFLUENZA VACCINE  05/23/2022   COVID-19 Vaccine (4 - 2023-24 season) 06/23/2022    Colorectal cancer screening: Type of screening: Colonoscopy. Completed 09/16/2019. Repeat every 7 years  Mammogram status: Completed 09/02/2022. Repeat  every year  Bone Density status: Completed 03/28/2017. Results reflect: Bone density results: NORMAL. Repeat every 5 years.  Lung Cancer Screening: (Low Dose CT Chest recommended if Age 70-80years, 30 pack-year currently smoking OR have quit w/in 15years.) does not qualify.   Lung Cancer Screening Referral: no  Additional Screening:  Hepatitis C Screening: does qualify; Completed 01/03/2018  Vision Screening: Recommended annual ophthalmology exams for early detection of glaucoma and other disorders of the eye. Is the patient up to date with their annual eye exam?  Yes  Who is the provider or what is the name of the office in which the patient attends annual eye exams? [facility] Hook OD. [facility] is not [facility] with a provider, would they like to be referred to a provider to establish care? No .   Dental Screening: Recommended annual dental exams for proper oral hygiene  Community  Resource Referral / Chronic Care Management: CRR required this visit?  No   CCM required this visit?  No      Plan:     I have personally reviewed and noted the following in the patient's chart:   Medical and social history Use of alcohol, tobacco or illicit drugs  Current medications and supplements including opioid prescriptions. Patient is not currently taking opioid prescriptions. Functional ability and status Nutritional status Physical activity Advanced directives List of other physicians Hospitalizations, surgeries, and ER visits in previous 12 months Vitals Screenings to include cognitive, depression, and falls Referrals and appointments  In addition, I have reviewed and discussed with patient certain preventive protocols, quality metrics, and best practice recommendations. A written personalized care plan for preventive services as well as general preventive health recommendations were provided to patient.     Sheral Flow, LPN   [id number]   Nurse  Notes:  Normal cognitive status assessed by direct observation by this Nurse Health Advisor. No abnormalities found.   Patient Medicare AWV questionnaire was completed by the patient on 01/01/2023; I have confirmed that all information answered by patient is correct and no changes since this date.       Medical screening examination/treatment/procedure(s) were performed by non-physician practitioner and as supervising physician I was immediately available for consultation/collaboration.  I agree with above. Lew Dawes, MD

## 2023-01-02 NOTE — Patient Instructions (Signed)
Samantha Stevenson , Thank you for taking time to come for your Medicare Wellness Visit. I appreciate your ongoing commitment to your health goals. Please review the following plan we discussed and let me know if I can assist you in the future.   These are the goals we discussed:  Goals      Patient Stated     My goal is to lose 20 pounds by watching my diet, staying hydrated and physically active.        This is a list of the screening recommended for you and due dates:  Health Maintenance  Topic Date Due   Zoster (Shingles) Vaccine (1 of 2) Never done   Flu Shot  05/23/2022   COVID-19 Vaccine (4 - 2023-24 season) 06/23/2022   Medicare Annual Wellness Visit  01/02/2024   Mammogram  09/02/2024   Colon Cancer Screening  09/15/2026   DTaP/Tdap/Td vaccine (3 - Td or Tdap) 10/06/2032   Pneumonia Vaccine  Completed   DEXA scan (bone density measurement)  Completed   Hepatitis C Screening: USPSTF Recommendation to screen - Ages 77-79 yo.  Completed   HPV Vaccine  Aged Out    Advanced directives: No  Conditions/risks identified: Yes  Next appointment: Follow up in one year for your annual wellness visit.   Preventive Care 63 Years and Older, Female Preventive care refers to lifestyle choices and visits with your health care provider that can promote health and wellness. What does preventive care include? A yearly physical exam. This is also called an annual well check. Dental exams once or twice a year. Routine eye exams. Ask your health care provider how often you should have your eyes checked. Personal lifestyle choices, including: Daily care of your teeth and gums. Regular physical activity. Eating a healthy diet. Avoiding tobacco and drug use. Limiting alcohol use. Practicing safe sex. Taking low-dose aspirin every day. Taking vitamin and mineral supplements as recommended by your health care provider. What happens during an annual well check? The services and screenings  done by your health care provider during your annual well check will depend on your age, overall health, lifestyle risk factors, and family history of disease. Counseling  Your health care provider may ask you questions about your: Alcohol use. Tobacco use. Drug use. Emotional well-being. Home and relationship well-being. Sexual activity. Eating habits. History of falls. Memory and ability to understand (cognition). Work and work Statistician. Reproductive health. Screening  You may have the following tests or measurements: Height, weight, and BMI. Blood pressure. Lipid and cholesterol levels. These may be checked every 5 years, or more frequently if you are over 69 years old. Skin check. Lung cancer screening. You may have this screening every year starting at age 66 if you have a 30-pack-year history of smoking and currently smoke or have quit within the past 15 years. Fecal occult blood test (FOBT) of the stool. You may have this test every year starting at age 12. Flexible sigmoidoscopy or colonoscopy. You may have a sigmoidoscopy every 5 years or a colonoscopy every 10 years starting at age 69. Hepatitis C blood test. Hepatitis B blood test. Sexually transmitted disease (STD) testing. Diabetes screening. This is done by checking your blood sugar (glucose) after you have not eaten for a while (fasting). You may have this done every 1-3 years. Bone density scan. This is done to screen for osteoporosis. You may have this done starting at age 5. Mammogram. This may be done every 1-2 years. Talk to  your health care provider about how often you should have regular mammograms. Talk with your health care provider about your test results, treatment options, and if necessary, the need for more tests. Vaccines  Your health care provider may recommend certain vaccines, such as: Influenza vaccine. This is recommended every year. Tetanus, diphtheria, and acellular pertussis (Tdap, Td)  vaccine. You may need a Td booster every 10 years. Zoster vaccine. You may need this after age 59. Pneumococcal 13-valent conjugate (PCV13) vaccine. One dose is recommended after age 40. Pneumococcal polysaccharide (PPSV23) vaccine. One dose is recommended after age 26. Talk to your health care provider about which screenings and vaccines you need and how often you need them. This information is not intended to replace advice given to you by your health care provider. Make sure you discuss any questions you have with your health care provider. Document Released: 11/05/2015 Document Revised: 06/28/2016 Document Reviewed: 08/10/2015 Elsevier Interactive Patient Education  2017 [place] Prevention in the Home Falls can cause injuries. They can happen to people of all ages. There are many things you can do to make your home safe and to help prevent falls. What can I do on the outside of my home? Regularly fix the edges of walkways and driveways and fix any cracks. Remove anything that might make you trip as you walk through a door, such as a raised step or threshold. Trim any bushes or trees on the path to your home. Use bright outdoor lighting. Clear any walking paths of anything that might make someone trip, such as rocks or tools. Regularly check to see if handrails are loose or broken. Make sure that both sides of any steps have handrails. Any raised decks and porches should have guardrails on the edges. Have any leaves, snow, or ice cleared regularly. Use sand or salt on walking paths during winter. Clean up any spills in your garage right away. This includes oil or grease spills. What can I do in the bathroom? Use night lights. Install grab bars by the toilet and in the tub and shower. Do not use towel bars as grab bars. Use non-skid mats or decals in the tub or shower. If you need to sit down in the shower, use a plastic, non-slip stool. Keep the floor dry. Clean up any  water that spills on the floor as soon as it happens. Remove soap buildup in the tub or shower regularly. Attach bath mats securely with double-sided non-slip rug tape. Do not have throw rugs and other things on the floor that can make you trip. What can I do in the bedroom? Use night lights. Make sure that you have a light by your bed that is easy to reach. Do not use any sheets or blankets that are too big for your bed. They should not hang down onto the floor. Have a firm chair that has side arms. You can use this for support while you get dressed. Do not have throw rugs and other things on the floor that can make you trip. What can I do in the kitchen? Clean up any spills right away. Avoid walking on wet floors. Keep items that you use a lot in easy-to-reach places. If you need to reach something above you, use a strong step stool that has a grab bar. Keep electrical cords out of the way. Do not use floor polish or wax that makes floors slippery. If you must use wax, use non-skid floor wax. Do not  have throw rugs and other things on the floor that can make you trip. What can I do with my stairs? Do not leave any items on the stairs. Make sure that there are handrails on both sides of the stairs and use them. Fix handrails that are broken or loose. Make sure that handrails are as long as the stairways. Check any carpeting to make sure that it is firmly attached to the stairs. Fix any carpet that is loose or worn. Avoid having throw rugs at the top or bottom of the stairs. If you do have throw rugs, attach them to the floor with carpet tape. Make sure that you have a light switch at the top of the stairs and the bottom of the stairs. If you do not have them, ask someone to add them for you. What else can I do to help prevent falls? Wear shoes that: Do not have high heels. Have rubber bottoms. Are comfortable and fit you well. Are closed at the toe. Do not wear sandals. If you use a  stepladder: Make sure that it is fully opened. Do not climb a closed stepladder. Make sure that both sides of the stepladder are locked into place. Ask someone to hold it for you, if possible. Clearly mark and make sure that you can see: Any grab bars or handrails. First and last steps. Where the edge of each step is. Use tools that help you move around (mobility aids) if they are needed. These include: Canes. Walkers. Scooters. Crutches. Turn on the lights when you go into a dark area. Replace any light bulbs as soon as they burn out. Set up your furniture so you have a clear path. Avoid moving your furniture around. If any of your floors are uneven, fix them. If there are any pets around you, be aware of where they are. Review your medicines with your doctor. Some medicines can make you feel dizzy. This can increase your chance of falling. Ask your doctor what other things that you can do to help prevent falls. This information is not intended to replace advice given to you by your health care provider. Make sure you discuss any questions you have with your health care provider. Document Released: 08/05/2009 Document Revised: 03/16/2016 Document Reviewed: 11/13/2014 Elsevier Interactive Patient Education  2017 [employer].

## 2023-01-07 DIAGNOSIS — Z23 Encounter for immunization: Secondary | ICD-10-CM | POA: Diagnosis not present

## 2023-01-08 NOTE — Progress Notes (Signed)
Samantha Stevenson D.[facility] Phone: [phone]   Assessment and Plan:     1. Chronic pain of right knee 2. Primary osteoarthritis of right knee  -Chronic with exacerbation, subsequent visit - Significant improvement in pain after intra-articular CSI performed at previous office visit on 12/22/2022 with patient having "clicking" in anterior knee returning over the past 2 to 3 days - Overall I am pleased with patient's improvement in pain after CSI.  Ideally we will wait at least 3 months in between injections - Patient cannot take NSAIDs due to past medical history - Recommend Tylenol for day-to-day pain relief - Continue HEP for knee  Pertinent previous records reviewed include none   Follow Up: As needed   Subjective:   I, Samantha Stevenson, am serving as a Education administrator for [employer]   Chief Complaint: bilateral arm and shoulder pain    HPI:  04/26/2022 Patient is a 72 year old female complaining of bilateral arm and shoulder pain. Patient states that she has a pain that radiates down to her fingertips it comes and it goes sometimes it stays for a month and then sometimes its a little pain, sometimes it gets to a point she cant do ADLs, has been going on for about 3 months now no MOI, states she had bruising on her bicep but no bruising now, has done PT but states that it didn't help , isnt able to sleep does radiate up into he neck, states it came on all of a sudden, no numbness or tingling, no locking clicking or popping , has been taking tylenol and has used tramadol and that seems to help with the pain   05/17/2022 Patient states that she is a whole lot better no complaints   08/30/2022 Patient states that her arm pain is back radiates down her shoulder to her arm and down her back , isnt able to lift things at times , TTP in her forearm and notes swelling and pain , no MOI been going on for a month ,  isnt able to sleep on that side due to pain    09/13/2022 Patient states that she is doing a lot better , till having pain in her groin left side down her leg    10/12/2022 Patient states that she is okay right side hip is problem now    11/17/2022 Patient states she feels fine    12/22/2022  Patient states right knee started popping everything else fees okay she is having a flare of arthritis   01/12/2023 Patient states kn ee started popping about 2 days ago     Relevant Historical Information: History of C3-C7 fusion  Additional pertinent review of systems negative.   Current Outpatient Medications:    acetaminophen (TYLENOL) 500 MG tablet, Take 1,000 mg by mouth every 8 (eight) hours as needed for moderate pain., Disp: , Rfl:    aspirin EC 81 MG tablet, Take 81 mg by mouth at bedtime., Disp: , Rfl:    B Complex Vitamins (VITAMIN B COMPLEX) TABS, Take 1 tablet by mouth daily., Disp: , Rfl:    clindamycin (CLEOCIN T) 1 % lotion, Apply 1 application. topically as needed., Disp: , Rfl:    diltiazem (DILACOR XR) 240 MG 24 hr capsule, TAKE ONE CAPSULE BY MOUTH EVERY MORNING, Disp: 90 capsule, Rfl: 1   ferrous sulfate 325 (65 FE) MG tablet, Take 325 mg by mouth daily with breakfast.,  Disp: , Rfl:    furosemide (LASIX) 20 MG tablet, Take 1 tablet (20 mg total) by mouth at bedtime., Disp: 30 tablet, Rfl: 0   LINZESS 72 MCG capsule, TAKE 1 CAPSULE BY MOUTH DAILY BEFORE BREAKFAST, Disp: 30 capsule, Rfl: 2   meloxicam (MOBIC) 15 MG tablet, Take 1 tablet (15 mg total) by mouth daily., Disp: 30 tablet, Rfl: 0   METAMUCIL FIBER PO, Take 1 Dose by mouth in the morning and at bedtime., Disp: , Rfl:    metroNIDAZOLE (METROCREAM) 0.75 % cream, Apply 1 application. topically as needed., Disp: , Rfl:    Multiple Vitamin (MULTIVITAMIN) tablet, Take 2 tablets by mouth daily. Gummies, Disp: , Rfl:    polyethylene glycol powder (GLYCOLAX/MIRALAX) 17 GM/SCOOP powder, Take 17 g by mouth daily., Disp: 3350 g,  Rfl: 11   prednisoLONE acetate (PRED FORTE) 1 % ophthalmic suspension, SMARTSIG:In Eye(s), Disp: , Rfl:    rosuvastatin (CRESTOR) 20 MG tablet, TAKE 1 TABLET BY MOUTH EVERY DAY, Disp: 90 tablet, Rfl: 3  Current Facility-Administered Medications:    methylPREDNISolone acetate (DEPO-MEDROL) injection 80 mg, 80 mg, Intramuscular, Once, [name], DO   Objective:     Vitals:   01/12/23 1030  BP: 130/82  Pulse: 83  SpO2: 97%  Weight: 172 lb (78 kg)  Height: 5\' 2"  (1.575 m)      Body mass index is 31.46 kg/m.    Physical Exam:    General:  awake, alert oriented, no acute distress nontoxic Skin: no suspicious lesions or rashes Neuro:sensation intact and strength 5/5 with no deficits, no atrophy, normal muscle tone Psych: No signs of anxiety, depression or other mood disorder  Right knee: Bilateral lower extremity edema Crepitus of right knee no swelling No deformity Neg fluid wave, joint milking ROM Flex 110, Ext 0 NTTP over the quad tendon, medial fem condyle, lat fem condyle, patella, plica, patella tendon, tibial tuberostiy, fibular head, posterior fossa, pes anserine bursa, gerdy's tubercle, medial jt line, lateral jt line Neg anterior and posterior drawer Neg lachman Neg sag sign Negative varus stress Negative valgus stress Negative Mc[place]ray  Gait normal    Electronically signed by:  Samantha Stevenson D.[facility] 10:43 AM 01/12/23

## 2023-01-10 DIAGNOSIS — H10013 Acute follicular conjunctivitis, bilateral: Secondary | ICD-10-CM | POA: Diagnosis not present

## 2023-01-12 ENCOUNTER — Ambulatory Visit (INDEPENDENT_AMBULATORY_CARE_PROVIDER_SITE_OTHER): Payer: Medicare Other | Admitting: Sports Medicine

## 2023-01-12 VITALS — BP 130/82 | HR 83 | Ht 62.0 in | Wt 172.0 lb

## 2023-01-12 DIAGNOSIS — M1711 Unilateral primary osteoarthritis, right knee: Secondary | ICD-10-CM

## 2023-01-12 DIAGNOSIS — M25561 Pain in right knee: Secondary | ICD-10-CM

## 2023-01-12 DIAGNOSIS — G8929 Other chronic pain: Secondary | ICD-10-CM

## 2023-01-17 DIAGNOSIS — H10013 Acute follicular conjunctivitis, bilateral: Secondary | ICD-10-CM | POA: Diagnosis not present

## 2023-01-19 DIAGNOSIS — I83891 Varicose veins of right lower extremities with other complications: Secondary | ICD-10-CM | POA: Diagnosis not present

## 2023-01-29 ENCOUNTER — Emergency Department (HOSPITAL_COMMUNITY)
Admission: EM | Admit: 2023-01-29 | Discharge: 2023-01-29 | Disposition: A | Discharge status: Home / Self Care | Payer: Medicare Other | Attending: Emergency Medicine | Admitting: Emergency Medicine

## 2023-01-29 ENCOUNTER — Emergency Department (HOSPITAL_COMMUNITY): Payer: Medicare Other

## 2023-01-29 DIAGNOSIS — R0602 Shortness of breath: Secondary | ICD-10-CM | POA: Insufficient documentation

## 2023-01-29 DIAGNOSIS — R5383 Other fatigue: Secondary | ICD-10-CM | POA: Diagnosis not present

## 2023-01-29 DIAGNOSIS — R0789 Other chest pain: Secondary | ICD-10-CM | POA: Insufficient documentation

## 2023-01-29 DIAGNOSIS — I714 Abdominal aortic aneurysm, without rupture, unspecified: Secondary | ICD-10-CM | POA: Insufficient documentation

## 2023-01-29 DIAGNOSIS — I517 Cardiomegaly: Secondary | ICD-10-CM | POA: Insufficient documentation

## 2023-01-29 DIAGNOSIS — R079 Chest pain, unspecified: Secondary | ICD-10-CM | POA: Diagnosis not present

## 2023-01-29 DIAGNOSIS — I7121 Aneurysm of the ascending aorta, without rupture: Secondary | ICD-10-CM | POA: Diagnosis not present

## 2023-01-29 DIAGNOSIS — Z91040 Latex allergy status: Secondary | ICD-10-CM | POA: Diagnosis not present

## 2023-01-29 DIAGNOSIS — I10 Essential (primary) hypertension: Secondary | ICD-10-CM | POA: Insufficient documentation

## 2023-01-29 DIAGNOSIS — R55 Syncope and collapse: Secondary | ICD-10-CM | POA: Diagnosis not present

## 2023-01-29 DIAGNOSIS — Z7982 Long term (current) use of aspirin: Secondary | ICD-10-CM | POA: Insufficient documentation

## 2023-01-29 DIAGNOSIS — Z79899 Other long term (current) drug therapy: Secondary | ICD-10-CM | POA: Diagnosis not present

## 2023-01-29 DIAGNOSIS — R531 Weakness: Secondary | ICD-10-CM | POA: Diagnosis not present

## 2023-01-29 LAB — HEPATIC FUNCTION PANEL
ALT: 16 U/L (ref 0–44)
AST: 24 U/L (ref 15–41)
Albumin: 3.2 g/dL — ABNORMAL LOW (ref 3.5–5.0)
Alkaline Phosphatase: 89 U/L (ref 38–126)
Bilirubin, Direct: <0.1 mg/dL (ref 0.0–0.2)
Total Bilirubin: 0.6 mg/dL (ref 0.3–1.2)
Total Protein: 6.9 g/dL (ref 6.5–8.1)

## 2023-01-29 LAB — BASIC METABOLIC PANEL
Anion gap: 10 (ref 5–15)
BUN: 19 mg/dL (ref 8–23)
CO2: 27 mmol/L (ref 22–32)
Calcium: 9.1 mg/dL (ref 8.9–10.3)
Chloride: 104 mmol/L (ref 98–111)
Creatinine, Ser: 0.96 mg/dL (ref 0.44–1.00)
GFR, Estimated: >60 mL/min (ref >60)
Glucose, Bld: 98 mg/dL (ref 70–99)
Potassium: 3.7 mmol/L (ref 3.5–5.1)
Sodium: 141 mmol/L (ref 135–145)

## 2023-01-29 LAB — CBC
HCT: 36.3 % (ref 36.0–46.0)
Hemoglobin: 11 g/dL — ABNORMAL LOW (ref 12.0–15.0)
MCH: 28.4 pg (ref 26.0–34.0)
MCHC: 30.3 g/dL (ref 30.0–36.0)
MCV: 93.6 fL (ref 80.0–100.0)
Platelets: 331 10*3/uL (ref 150–400)
RBC: 3.88 MIL/uL (ref 3.87–5.11)
RDW: 14.7 % (ref 11.5–15.5)
WBC: 5.5 10*3/uL (ref 4.0–10.5)
nRBC: 0 % (ref 0.0–0.2)

## 2023-01-29 LAB — TROPONIN I (HIGH SENSITIVITY): Troponin I (High Sensitivity): 3 ng/L (ref <18)

## 2023-01-29 LAB — BRAIN NATRIURETIC PEPTIDE: B Natriuretic Peptide: 21.2 pg/mL (ref 0.0–100.0)

## 2023-01-29 MED ORDER — ONDANSETRON HCL 4 MG/2ML IJ SOLN
4.0000 mg | Freq: Once | INTRAMUSCULAR | Status: AC
  Administered 2023-01-29: 4 mg via INTRAVENOUS
  Filled 2023-01-29: qty 2

## 2023-01-29 MED ORDER — ACETAMINOPHEN 500 MG PO TABS
1000.0000 mg | ORAL_TABLET | Freq: Once | ORAL | Status: DC
  Filled 2023-01-29: qty 2

## 2023-01-29 MED ORDER — LIDOCAINE 5 % EX PTCH
1.0000 | MEDICATED_PATCH | CUTANEOUS | Status: DC
  Administered 2023-01-29: 1 via TRANSDERMAL
  Filled 2023-01-29: qty 1

## 2023-01-29 MED ORDER — IOHEXOL 350 MG/ML SOLN
75.0000 mL | Freq: Once | INTRAVENOUS | Status: AC | PRN
  Administered 2023-01-29: 75 mL via INTRAVENOUS

## 2023-01-29 MED ORDER — MORPHINE SULFATE (PF) 4 MG/ML IV SOLN
4.0000 mg | Freq: Once | INTRAVENOUS | Status: AC
  Administered 2023-01-29: 4 mg via INTRAVENOUS
  Filled 2023-01-29: qty 1

## 2023-01-29 MED ORDER — LIDOCAINE 4 % EX PTCH: 1.0000 | MEDICATED_PATCH | CUTANEOUS | 0 refills | Status: DC

## 2023-01-29 MED ORDER — OXYCODONE HCL 5 MG PO TABS: 5.0000 mg | ORAL_TABLET | Freq: Four times a day (QID) | ORAL | 0 refills | Status: DC | PRN

## 2023-01-29 NOTE — ED Triage Notes (Signed)
Patient complains of neck, back, and chest pain started Friday and has gotten progressively worse since then. Patient also complains of shortness of breath. She is alert, oriented, ambulating independently with steady gait, speaking in complete sentences, and is in no apparent distress at this time.

## 2023-01-29 NOTE — ED Provider Notes (Signed)
[place] EMERGENCY DEPARTMENT AT [facility] Provider Note   CSN: [id number] Arrival date & time: 01/29/23  1733     History  Chief Complaint  Patient presents with   Chest Pain    Samantha Stevenson is a 72 y.o. female.with pmh HTN, HLD, PE  , PAFib , history of intramural hematoma of the thoracic aorta extending from the left subclavian artery to the renal artery who presents with chest pain since this past Friday.  She complains of chest pain and tightness radiating to her back and into both of her arms since this past Friday.  It is associated with shortness of breath and worse on exertion.  Is also worse with palpation.  She has had no recent injuries.  She has no loss of sensation or weakness in the arms.  She has had previous large hematoma before and this feels similar to it.  She is no longer on any blood thinners.  She also has known history of cervical spondylosis with myelopathy and radiculopathy as well as lumbar radiculopathy but feels slightly different.  She has been taking Tylenol without relief.  She has had no fevers, chills, productive cough, hemoptysis.  Legs have not been more swollen than usual.  She was ambulating this past Saturday and felt very light headed, short of breath, chest tightness and almost passed out but did not pass out.   Chest Pain      Home Medications Prior to Admission medications   Medication Sig Start Date End Date Taking? Authorizing Provider  lidocaine (HM LIDOCAINE PATCH) 4 % Place 1 patch onto the skin daily. 01/29/23  Yes Mardene Sayer, MD  oxyCODONE (ROXICODONE) 5 MG immediate release tablet Take 1 tablet (5 mg total) by mouth every 6 (six) hours as needed for up to 10 doses for severe pain. 01/29/23  Yes Mardene Sayer, MD  acetaminophen (TYLENOL) 500 MG tablet Take 1,000 mg by mouth every 8 (eight) hours as needed for moderate pain.    [provider]  aspirin EC 81 MG tablet Take 81 mg by mouth at bedtime.     [provider]  B Complex Vitamins (VITAMIN B COMPLEX) TABS Take 1 tablet by mouth daily.    [provider]  clindamycin (CLEOCIN T) 1 % lotion Apply 1 application. topically as needed. 12/13/21   [provider]  diltiazem (DILACOR XR) 240 MG 24 hr capsule TAKE ONE CAPSULE BY MOUTH EVERY MORNING 10/02/22   Myrlene Broker, MD  ferrous sulfate 325 (65 FE) MG tablet Take 325 mg by mouth daily with breakfast.    [provider]  furosemide (LASIX) 20 MG tablet Take 1 tablet (20 mg total) by mouth at bedtime. 02/20/22   Myrlene Broker, MD  LINZESS 72 MCG capsule TAKE 1 CAPSULE BY MOUTH DAILY BEFORE BREAKFAST 07/06/22   Sherrilyn Rist, MD  meloxicam (MOBIC) 15 MG tablet Take 1 tablet (15 mg total) by mouth daily. 08/30/22   Richardean Sale, DO  METAMUCIL FIBER PO Take 1 Dose by mouth in the morning and at bedtime.    [provider]  metroNIDAZOLE (METROCREAM) 0.75 % cream Apply 1 application. topically as needed. 12/13/21   [provider]  Multiple Vitamin (MULTIVITAMIN) tablet Take 2 tablets by mouth daily. Gummies    [provider]  polyethylene glycol powder (GLYCOLAX/MIRALAX) 17 GM/SCOOP powder Take 17 g by mouth daily. 12/26/21   Myrlene Broker, MD  prednisoLONE acetate (PRED  FORTE) 1 % ophthalmic suspension SMARTSIG:In Eye(s) 07/13/22   [provider]  rosuvastatin (CRESTOR) 20 MG tablet TAKE 1 TABLET BY MOUTH EVERY DAY 04/17/22   Myrlene Broker, MD      Allergies    Penicillins, Shellfish allergy, Latex, Banana, Kiwi extract, Lactose intolerance (gi), Oxycodone, Asa [aspirin], Celebrex [celecoxib], and Nsaids    Review of Systems   Review of Systems  Cardiovascular:  Positive for chest pain.    Physical Exam Updated Vital Signs BP 126/64   Pulse 63   Temp 98.8 F (37.1 C) (Oral)   Resp 20   SpO2 98%  Physical Exam Constitutional: Alert and oriented.  Nontoxic, NAD Eyes:  Conjunctivae are normal. ENT      Head: Normocephalic and atraumatic.      Neck: No stridor. Cardiovascular: S1, S2,  Normal and symmetric distal pulses are present in all extremities.Warm and well perfused. Reproducible anterior chest wall tenderness as well as posterior left-sided upper back and chest wall tenderness.  No external evidence of injury. Respiratory: Normal respiratory effort. Breath sounds are normal.  O2 sat 99 on RA Gastrointestinal: Soft and nontender.  Musculoskeletal: Normal range of motion in all extremities. 1+ equal nonerythematous nontender pitting edema extending from foot to knee bilaterally Neurologic: Normal speech and language.  No facial droop.  EOMI.  Equal strength bilateral upper extremities.  Sensation grossly intact.  4 out of 5 strength left lower extremity limited by pain and sciatica.  5 out of 5 strength right lower extremity. Skin: Skin is warm, dry and intact. No rash noted. Psychiatric: Mood and affect are normal. Speech and behavior are normal.  ED Results / Procedures / Treatments   Labs (all labs ordered are listed, but only abnormal results are displayed) Labs Reviewed  CBC - Abnormal; Notable for the following components:      Result Value   Hemoglobin 11.0 (*)    All other components within normal limits  HEPATIC FUNCTION PANEL - Abnormal; Notable for the following components:   Albumin 3.2 (*)    All other components within normal limits  BASIC METABOLIC PANEL  BRAIN NATRIURETIC PEPTIDE  TROPONIN I (HIGH SENSITIVITY)    EKG EKG Interpretation  Date/Time:  Monday January 29 2023 17:21:56 EDT Ventricular Rate:  75 PR Interval:  146 QRS Duration: 84 QT Interval:  396 QTC Calculation: 442 R Axis:   -76 Text Interpretation: Normal sinus rhythm Possible Left atrial enlargement Left anterior fascicular block Possible Anterior infarct , age undetermined Abnormal ECG No significant change since last tracing When compared with ECG of  10-Mar-2021 10:15, PREVIOUS ECG IS PRESENT Confirmed by Vivien Rossetti ([id number]) on 01/29/2023 6:21:38 PM  Radiology CT Angio Chest/Abd/Pel for Dissection W and/or Wo Contrast  Result Date: 01/29/2023 CLINICAL DATA:  Chest pain radiating to back, dyspnea, history of intrathoracic/intramural hematoma. EXAM: CT ANGIOGRAPHY CHEST, ABDOMEN AND PELVIS TECHNIQUE: Non-contrast CT of the chest was initially obtained. Multidetector CT imaging through the chest, abdomen and pelvis was performed using the standard protocol during bolus administration of intravenous contrast. Multiplanar reconstructed images and MIPs were obtained and reviewed to evaluate the vascular anatomy. RADIATION DOSE REDUCTION: This exam was performed according to the departmental dose-optimization program which includes automated exposure control, adjustment of the mA and/or kV according to patient size and/or use of iterative reconstruction technique. CONTRAST:  68mL OMNIPAQUE IOHEXOL 350 MG/ML SOLN COMPARISON:  04/27/2022. FINDINGS: CTA CHEST FINDINGS Cardiovascular: The heart is enlarged and there is a trace pericardial effusion.  Multi-vessel coronary artery calcifications are noted. There is no evidence of aortic aneurysm or dissection. An aortic graft is seen at the aortic arch and extending through the distal thoracic aorta. A stent is noted in the carotid artery on the left. The pulmonary trunk is normal in caliber. Mediastinum/Nodes: No mediastinal, hilar, or axillary lymphadenopathy. The thyroid gland, trachea, and esophagus are within normal limits. Lungs/Pleura: Atelectasis is present in the lungs bilaterally. No effusion or pneumothorax. Musculoskeletal: Cervical spinal fusion hardware is noted. And degenerative changes are present in the thoracic spine. No acute osseous abnormality. Review of the MIP images confirms the above findings. CTA ABDOMEN AND PELVIS FINDINGS VASCULAR Aorta: No dissection, vasculitis or significant stenosis.  Aortic atherosclerosis with mild aneurysmal dilatation of the proximal abdominal aorta inferior to the stent graft measuring 3.6 cm versus 3.8 cm on the prior exam. Celiac: Patent without evidence of aneurysm, dissection, vasculitis or significant stenosis. SMA: Patent without evidence of aneurysm, dissection, vasculitis or significant stenosis. Renals: Both renal arteries are patent without evidence of aneurysm, dissection, vasculitis, fibromuscular dysplasia or significant stenosis. IMA: Patent. Inflow: Patent without evidence of aneurysm, dissection, vasculitis or significant stenosis. Veins: No obvious venous abnormality within the limitations of this arterial phase study. Review of the MIP images confirms the above findings. NON-VASCULAR Hepatobiliary: Hypodensities are present in the right lobe of the liver, likely cysts. No biliary ductal dilatation. The gallbladder is without stones. Pancreas: The pancreas is within normal limits. There is dilatation of the pancreatic duct at the body measuring up to 6 mm. No surrounding inflammatory changes. Spleen: Normal in size without focal abnormality. Adrenals/Urinary Tract: Adrenal glands are unremarkable. Kidneys are normal, without renal calculi, focal lesion, or hydronephrosis. Bladder is unremarkable. Stomach/Bowel: Stomach is within normal limits. Appendix appears normal. No evidence of bowel wall thickening, distention, or inflammatory changes. No free air or pneumatosis. Multiple scattered diverticula are present along the colon without evidence of diverticulitis. Lymphatic: No abdominal or pelvic lymphadenopathy. Reproductive: Status post hysterectomy. No adnexal masses. Other: No abdominopelvic ascites. Musculoskeletal: Total hip arthroplasty changes are present on the left. Mild degenerative changes are noted in the lumbar spine. No acute osseous abnormality. Review of the MIP images confirms the above findings. IMPRESSION: 1. Stable thoracic aortic  endograft without evidence of aneurysm or dissection. 2. Aortic atherosclerosis with mild aneurysmal dilatation of the proximal abdominal aorta measuring up to 3.6 cm, decreased from 3.8 cm on the previous exam. Recommend follow-up ultrasound every 2 years. This recommendation follows ACR consensus guidelines: White Paper of the ACR Incidental Findings Committee II on Vascular Findings. J Am Coll Radiol 2013; 10:789-794. 3. Cardiomegaly with coronary artery calcifications. 4. Remaining incidental findings as described above. Electronically Signed   By: Thornell SartoriusLaura  Taylor M.D.   On: 01/29/2023 21:05   CT Head Wo Contrast  Result Date: 01/29/2023 CLINICAL DATA:  Recent syncopal episode with weakness, initial encounter EXAM: CT HEAD WITHOUT CONTRAST TECHNIQUE: Contiguous axial images were obtained from the base of the skull through the vertex without intravenous contrast. RADIATION DOSE REDUCTION: This exam was performed according to the departmental dose-optimization program which includes automated exposure control, adjustment of the mA and/or kV according to patient size and/or use of iterative reconstruction technique. COMPARISON:  None Available. FINDINGS: Brain: No evidence of acute infarction, hemorrhage, hydrocephalus, extra-axial collection or mass lesion/mass effect. Vascular: No hyperdense vessel or unexpected calcification. Skull: Normal. Negative for fracture or focal lesion. Sinuses/Orbits: No acute finding. Other: None. IMPRESSION: No acute intracranial abnormality noted. Electronically  Signed   By: Alcide Clever M.D.   On: 01/29/2023 20:44   DG Chest 2 View  Result Date: 01/29/2023 CLINICAL DATA:  chest pain EXAM: CHEST - 2 VIEW COMPARISON:  03/10/2021 FINDINGS: Descending thoracic aortic stent. Cardiac silhouette enlarged. No evidence of pneumothorax or pleural effusion. No evidence of pulmonary edema. No osseous abnormalities identified. IMPRESSION: Enlarged cardiac silhouette.  Lungs are clear.  Electronically Signed   By: Layla Maw M.D.   On: 01/29/2023 18:35    Procedures Procedures  Remain on constant cardiac monitoring sinus rhythm with normal rates.  Medications Ordered in ED Medications  acetaminophen (TYLENOL) tablet 1,000 mg (1,000 mg Oral Not Given 01/29/23 2008)  lidocaine (LIDODERM) 5 % 1 patch (1 patch Transdermal Patch Applied 01/29/23 2001)  morphine (PF) 4 MG/ML injection 4 mg (4 mg Intravenous Given 01/29/23 2007)  ondansetron (ZOFRAN) injection 4 mg (4 mg Intravenous Given 01/29/23 2004)  iohexol (OMNIPAQUE) 350 MG/ML injection 75 mL (75 mLs Intravenous Contrast Given 01/29/23 2015)    ED Course/ Medical Decision Making/ A&P Clinical Course as of 01/29/23 2334  Mon Jan 29, 2023  2153 Patient reassessed endorses improvement of pain although still present.  Pain is reproducible.  Did discuss incidental findings of CTA chest abdomen pelvis and offered patient admission for continued observation and echocardiogram in morning but patient declines.  She would much rather follow-up with her own cardiologist.  Did discuss strict return precautions and advised close follow-up with cardiologist as planned.  Will discharge with short course of oxycodone and advised continue Tylenol ibuprofen Lidoderm patches.  Patient and daughter in agreement with plan to discharge with follow-up with cardiology with strict return precautions.  She is discharged in good condition. [VB]    Clinical Course User Index [VB] Mardene Sayer, MD                             Medical Decision Making Samantha Stevenson is a 72 y.o. female.with pmh HTN, HLD, PE  , PAFib , history of intramural hematoma of the thoracic aorta extending from the left subclavian artery to the renal artery who presents with chest pain since this past Friday.    Pain is reproducible, suspect more likely consistent with musculoskeletal etiology.  She is neurovascularly intact and no concern for CVA and additionally had an  CT head which was unremarkable.  EKG unchanged from prior with a heart score of 3 and a troponin of 3, less likely acute ACS.  Hemoglobin of 11 consistent with baseline.  Due to patient's extensive history of intramural hematoma of thoracic aorta, CTA dissection study obtained to ensure no complications.  There was no reported PE, no evidence of aneurysm or dissection of aortic endograft, no acute findings.  There were incidental findings of mild abdominal aorta dilation of the proximal aorta 3.6 cm decreased from prior as well as cardiomegaly with coronary calcifications.  Offered patient admission for continued observation and echocardiogram in the morning which she politely declined at this time.  She would much rather follow-up with her own cardiologist.  Discharged with prescription for lidocaine patches and oxycodone for pain control.  Strict return precautions discussed with plan for close follow-up with her own cardiologist.  Patient and patient's daughter in agreement with plan.  Amount and/or Complexity of Data Reviewed Labs: ordered. Radiology: ordered.  Risk OTC drugs. Prescription drug management.      Final Clinical Impression(s) / ED Diagnoses Final  diagnoses:  Chest pain, unspecified type  Cardiomegaly  Abdominal aortic aneurysm (AAA) without rupture, unspecified part    Rx / DC Orders ED Discharge Orders          Ordered    oxyCODONE (ROXICODONE) 5 MG immediate release tablet  Every 6 hours PRN        01/29/23 2147    lidocaine (HM LIDOCAINE PATCH) 4 %  Every 24 hours        01/29/23 2147              Mardene Sayer, MD 01/29/23 [phone]

## 2023-01-29 NOTE — Discharge Instructions (Addendum)
You were seen in the emergency department today for chest pain. You workup did not reveal a definite cause of your symptoms but was generally reassuring.  As we discussed, there were some incidental findings on your CT scan that showed a slightly enlarged heart known as cardiomegaly as well as listed below with very mild dilation of your abdominal aorta which will need follow-up every 2 years imaging.  Continue to take Tylenol and ibuprofen as needed for pain control as well as the lidocaine patches and oxycodone for severe pain.  Do not take oxycodone with alcohol or while driving a vehicle.  This medicine can make you drowsy.  Return to the emergency department immediately if you develop recurrent, severe chest pain, shortness of breath, fainting spells, sudden sweatiness, or any other concerning symptoms.   Please also make an appointment to follow up with your primary care doctor or cardiologist within one week to assure improvement or resolution in symptoms. Further testing may be necessary, so it is extremely important to keep your follow-up appointment with your primary doctor.    Mild aneurysmal dilatation of the  proximal abdominal aorta measuring up to 3.6 cm, decreased from 3.8  cm on the previous exam. Recommend follow-up ultrasound every 2  years. This recommendation follows ACR consensus guidelines: White  Paper of the ACR Incidental Findings Committee II on Vascular  Findings. J Am Coll Radiol 2013; 10:789-794.

## 2023-02-01 ENCOUNTER — Telehealth: Payer: Self-pay

## 2023-02-01 NOTE — Telephone Encounter (Signed)
     Patient  visit on 4/8  at [facility]  Have you been able to follow up with your primary care physician? Yes   The patient was or was not able to obtain any needed medicine or equipment. Yes   Are there diet recommendations that you are having difficulty following? Na  Patient expresses understanding of discharge instructions and education provided has no other needs at this time. Yes       Lenard Forth [facility] Guide, [facility] [phone] [address] [place], [place], [place] [address] Phone: [phone] Email: [email or web address]

## 2023-02-08 ENCOUNTER — Ambulatory Visit: Payer: Medicare Other | Admitting: Student

## 2023-02-11 NOTE — Progress Notes (Unsigned)
Cardiology Office Note Date:  02/11/2023  Patient ID:  [name], [name] [birth year], MRN [id number] PCP:  Samantha Broker, MD  Electrophysiologist:  Dr. Elberta Stevenson   Chief Complaint:   annual visit  History of Present Illness: Samantha Stevenson is a 72 y.o. 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   She saw Dr. Ova Stevenson 2017, at that time was referred to him for her Afib.  He mentions though having no records to review that actually showed her AFib, and that attempts to get them would be made, that she may benefit from a/c in the future if she does have AF.  She was c/o of some SOB and planned for an echo.  Echo then noted LVEF 55%, no WMA, no significant VHD, findings of elevated LVEDP and LA filling pressure.  She was not seen again until May 2019 by [facility], seeing Samantha Gauze, PA for surgical clearance prior to a thumb surgery.  She noted records obtained 2014 had EKGs both SR and pt declined a/c.  She was treated 6 mo in 2014 for PE. At the time of her visit the pt felt well, had visited [place] walked much without difficulty, had chrinic LE edema managed with support stockings and lasix.  Palpitations controlled with her dilt and dig well, though mentiuoned when not taking them would have Afib. She was cleared for her surgery, also noted the patient had become agreeable for a/c and planned to start Xarelto 2 days post op and have RPH follow up afterwards.  August 2019, she developed CP, went to [facility], given h/o PE had CT chest done, noting  an acute intramural hematoma of the thoracic aorta extending from the left subclavian artery to the renal artery origins, her BP brought under strict control and her xarelto reversed  CTS and VVS evaluated noted with type B aortic intramural hematoma  06/13/2018 1.  Left subclavian to  carotid artery transposition 2.  Percutaneous access right common femoral artery 3.  Thoracic endovascular endo-grafting with left subclavian artery coverage with 28 x 28 x 15 cm distal and 34 x 34 x 20 cm proximal Gore CTAG 4.  Left common carotid artery stent with 7 x 40 mm Innova distal an 8 x 40 mm Innova proximally 5.  Right common femoral endarterectomy with greater saphenous vein patch angioplasty 6.  Intravascular ultrasound thoracic aorta.  developed tachycardia/hypotension found with lymphatic leak  06/16/2018 1.  Exploration left neck wound placement of 10 flat drain 2.  Left sided 28 French chest tube placement  TTE done during this stay noted LVEF 55-60%, no WMA, grade I DD, There was evidence of aortic dissection flap noted   in the descending thoracic aorta (0.37cm flap posterior). Seen on   CT scan. No significant VHD.  She last saw Dr. Randie Stevenson 05/16/2019 doing well from her surgical perspective.  There was mention of some hematuria that she had apparently had in thepast as well.  He mentioned to discuss wether she needed the Xarelto (noting on it for DVT/PE history , no mention of Afib hx), and asked the pt to f/u with her PMD, but did say she needed lifeling ASA therapy for her stents.  She was stable from a vascular perspective in review of her CT scan and planned for annual visit with him.  Looks like she saw  her PMD Sept 2020 for constipation, neck pain and discussion about wanting to stop her xarelto.  She was recommended to discuss a/c with [place], noting CHA2DS2Vasc score of 5 and she recommended lifelong a/c  She saw Dr. Elberta Stevenson 06/23/21, she had been struggling with hematuria, resolved off ASA and Xarelto, urologic evaluation did not find cause for bleeding, Xarelto resumed with recurrent bleeding, was off a/c, on ASA. She reported feeling well, no CP, no SOB No symptoms of Afib No bleeding on ASA  Planned for an annual visit   I saw her 08/08/22 She is doing very  well Exercising, walking on the treadmill, some other exercises, goes to the gym sometimes as well. No CP, palpitations, SOB No hematuria off Xarelto She feels confident she would know if she has had AFib Has not had any symptoms in years No near syncope or syncope. PMD does her labs I discussed that don't think we have any formal record of her Afib though she relates a clear and known longstanding diagnosis of this OFF a/c 2/2 recurrent hematuria She did not want to consider alternative OAC, or resuming a/c Discussed monitoring options  Wearable tech, Kardia, perhaps loop monitor. She would not want to pursue loop, but planned to look into the kardia  ER visit 01/29/23 for CP, tightness radiating to her back and into both of her arms since this past Friday. It is associated with shortness of breath and worse on exertion. Is also worse with palpation. Unchanged EKG, neg trop x1, CT chest stable AO Offered observation admission, she declined. Suspect CP musculoskeletal K+ 3.7 BUN/Creat 19/0.96 BNP 21.2 WBC 5.5 H/H 11/36 Plts 331  *** symptoms, monitor tech of some kind   Past Medical History:  Diagnosis Date   Allergy    Anemia    Arthritis    "qwhere" (07/29/2018)   Atrial fibrillation (HCC)    CHF (congestive heart failure) (HCC)    Clotting disorder (HCC)    Degenerative arthritis of hip    s/p L THR 12/2012   Dyspnea    since surgery in August 2019- "when I get worked up and walk a short distance"   Dysrhythmia    History of blood transfusion 05/2018   "related to OR"   Hyperlipidemia    Hypertension    Intramural hematoma of thoracic aorta (HCC) 06/13/2018   Migraines    mIgraines- none since blood pressure and lipids are under control   Myocardial infarction (HCC) 2000   due to atrial fib   Neuromuscular disorder (HCC)    pinched nerve- left side of neck   Pulmonary emboli (HCC) 12/2012 dx   post op (L THR), anticoag x 89mo    Past Surgical History:  Procedure  Laterality Date   ABDOMINAL HYSTERECTOMY     "w/1 tube"   ANTERIOR CERVICAL DECOMPRESSION/DISCECTOMY FUSION 4 LEVELS N/A 02/02/2020   Procedure: CERVICAL THREE-FOUR, CERVICAL FOUR-FIVE, CERVICAL FIVE-SIX, CERVICAL SIX-SEVEN ANTERIOR CERVICAL DECOMPRESSION/DISCECTOMY FUSION;  Surgeon: Julio Sicks, MD;  Location: [facility] OR;  Service: Neurosurgery;  Laterality: N/A;   ARTERY REPAIR Left 08/15/2018   Procedure: LEFT BRACHIAL ARTERY EXPLORATION;  Surgeon: Maeola Harman, MD;  Location: [facility] OR;  Service: Vascular;  Laterality: Left;   Breast Ultrasound Left 04/16/2013   Done @ breast center Impression: no malignancy appearance noted on the screen study is consistent with a summation shadow   BUNIONECTOMY Left    CARDIAC CATHETERIZATION     CAROTID-SUBCLAVIAN BYPASS GRAFT Left 06/13/2018   Procedure: BYPASS  GRAFT CAROTID-SUBCLAVIAN;  Surgeon: Maeola Harman, MD;  Location: [facility] OR;  Service: Vascular;  Laterality: Left;   COLONOSCOPY     CYSTOSCOPY WITH RETROGRADE PYELOGRAM, URETEROSCOPY AND STENT PLACEMENT Bilateral 11/02/2020   Procedure: DIAGNOSTIC CYSTOSCOPY WITH RETROGRADE PYELOGRAM,BILATERAL DIAGNOSTIC URETEROSCOPY  AND BILATERAL STENT PLACEMENT;  Surgeon: Noel Christmas, MD;  Location: [facility];  Service: Urology;  Laterality: Bilateral;  90 MINS   DG TUMB RIGHT HAND Right    cyst removal   IR THORACENTESIS ASP PLEURAL SPACE W/IMG GUIDE  05/31/2018   JOINT REPLACEMENT     KNEE ARTHROSCOPY Left 12/16/2020   Procedure: LEFT KNEE ARTHROSCOPY WITH PARTIAL LATERAL MENISCECTOMY;  Surgeon: Kathryne Hitch, MD;  Location: [facility];  Service: Orthopedics;  Laterality: Left;   THORACIC AORTIC ENDOVASCULAR STENT GRAFT Left 06/13/2018   Procedure: LEFT  SUBCLAVIAN TO CAROTID ARTERY TRANSPOSITION; THORACIC AORTIC ENDOVASCULAR STENT GRAFT using GORE CONFORMABLE THORACIC STENT GRAFT AND GORE TAG THORACIC ENDOPROSTHESIS; STENT LEFT COMMON CAROTID  ARTERY, RIGHT COMMON-FEMORAL ENDARTERECTOMY WITH PATCH ANGIOPLASTY;  Surgeon: Maeola Harman, MD;  Location: [facility] OR;  Service: Vascular;  Laterality: Left;   TONSILLECTOMY     TOTAL HIP ARTHROPLASTY Left 01/03/2013   Procedure: TOTAL HIP ARTHROPLASTY ANTERIOR APPROACH;  Surgeon: Kathryne Hitch, MD;  Location: [facility] ORS;  Service: Orthopedics;  Laterality: Left;  Left Total Hip Arthroplasty, Anterior Approach   TUBAL LIGATION     VAGINA RECONSTRUCTION SURGERY  1966   vagina had closed up when 72 years old   WOUND EXPLORATION Left 06/16/2018   Procedure: LEFT NECK WOUND EXPLORATION, CHEST TUBE INSERTION;  Surgeon: Maeola Harman, MD;  Location: [facility] OR;  Service: Vascular;  Laterality: Left;    Current Outpatient Medications  Medication Sig Dispense Refill   acetaminophen (TYLENOL) 500 MG tablet Take 1,000 mg by mouth every 8 (eight) hours as needed for moderate pain.     aspirin EC 81 MG tablet Take 81 mg by mouth at bedtime.     B Complex Vitamins (VITAMIN B COMPLEX) TABS Take 1 tablet by mouth daily.     clindamycin (CLEOCIN T) 1 % lotion Apply 1 application. topically as needed.     diltiazem (DILACOR XR) 240 MG 24 hr capsule TAKE ONE CAPSULE BY MOUTH EVERY MORNING 90 capsule 1   ferrous sulfate 325 (65 FE) MG tablet Take 325 mg by mouth daily with breakfast.     furosemide (LASIX) 20 MG tablet Take 1 tablet (20 mg total) by mouth at bedtime. 30 tablet 0   lidocaine (HM LIDOCAINE PATCH) 4 % Place 1 patch onto the skin daily. 30 patch 0   LINZESS 72 MCG capsule TAKE 1 CAPSULE BY MOUTH DAILY BEFORE BREAKFAST 30 capsule 2   meloxicam (MOBIC) 15 MG tablet Take 1 tablet (15 mg total) by mouth daily. 30 tablet 0   METAMUCIL FIBER PO Take 1 Dose by mouth in the morning and at bedtime.     metroNIDAZOLE (METROCREAM) 0.75 % cream Apply 1 application. topically as needed.     Multiple Vitamin (MULTIVITAMIN) tablet Take 2 tablets by mouth daily. Gummies     oxyCODONE  (ROXICODONE) 5 MG immediate release tablet Take 1 tablet (5 mg total) by mouth every 6 (six) hours as needed for up to 10 doses for severe pain. 10 tablet 0   polyethylene glycol powder (GLYCOLAX/MIRALAX) 17 GM/SCOOP powder Take 17 g by mouth daily. 3350 g 11   prednisoLONE acetate (PRED FORTE) 1 %  ophthalmic suspension SMARTSIG:In Eye(s)     rosuvastatin (CRESTOR) 20 MG tablet TAKE 1 TABLET BY MOUTH EVERY DAY 90 tablet 3   Current Facility-Administered Medications  Medication Dose Route Frequency Provider Last Rate Last Admin   methylPREDNISolone acetate (DEPO-MEDROL) injection 80 mg  80 mg Intramuscular Once Richardean Sale, DO        Allergies:   Penicillins, Shellfish allergy, Latex, Banana, Kiwi extract, Lactose intolerance (gi), Oxycodone, Asa [aspirin], Celebrex [celecoxib], and Nsaids   Social History:  The patient  reports that she has never smoked. She has never used smokeless tobacco. She reports that she does not currently use alcohol. She reports that she does not use drugs.   Family History:  The patient's family history includes Alcohol abuse in an other family member; Breast cancer in an other family member; Diabetes in her mother, sister, and another family member; Heart attack (age of onset: 60) in her mother; Heart disease in her father, mother, and another family member; Hyperlipidemia in an other family member; Hypertension in an other family member.  ROS:  Please see the history of present illness.  All other systems are reviewed and otherwise negative.   PHYSICAL EXAM:  VS:  There were no vitals taken for this visit. BMI: There is no height or weight on file to calculate BMI. Well nourished, well developed, in no acute distress  HEENT: normocephalic, atraumatic  Neck: no JVD, carotid bruits or masses Cardiac: *** RRR; no significant murmurs, no rubs, or gallops Lungs: *** CTA b/l , no wheezing, rhonchi or rales  Abd: soft, nontender MS: no deformity or  atrophy Ext: *** no edema Skin: warm and dry, no rash Neuro:  No gross deficits appreciated Psych: euthymic mood, full affect    EKG:  done today and reviewed by myself ***  01/29/23: CT chest/abd/pelvis IMPRESSION: 1. Stable thoracic aortic endograft without evidence of aneurysm or dissection. 2. Aortic atherosclerosis with mild aneurysmal dilatation of the proximal abdominal aorta measuring up to 3.6 cm, decreased from 3.8 cm on the previous exam. Recommend follow-up ultrasound every 2 years. This recommendation follows ACR consensus guidelines: White Paper of the ACR Incidental Findings Committee II on Vascular Findings. J Am Coll Radiol 2013; 10:789-794. 3. Cardiomegaly with coronary artery calcifications. 4. Remaining incidental findings as described above.  10/20/2019: Lexiscan stress myoview Nuclear stress EF: 66%. There was no ST segment deviation noted during stress. No T wave inversion was noted during stress. The study is normal. This is a low risk study. No ischemia. The left ventricular ejection fraction is hyperdynamic (>65%).  10/20/2019 TTE IMPRESSIONS  1. Left ventricular ejection fraction, by visual estimation, is 60 to 65%. The left ventricle has normal function. There is no left ventricular hypertrophy.  2. Elevated left ventricular end-diastolic pressure.  3. Left ventricular diastolic parameters are consistent with Grade I diastolic dysfunction (impaired relaxation).  4. The left ventricle has no regional wall motion abnormalities.  5. Global right ventricle has normal systolic function.The right ventricular size is normal. No increase in right ventricular wall thickness.  6. Left atrial size was mildly dilated.  7. Right atrial size was normal.  8. The mitral valve is normal in structure. Trivial mitral valve regurgitation. No evidence of mitral stenosis.  9. The tricuspid valve is normal in structure. 10. The aortic valve is normal in structure. Aortic  valve regurgitation is not visualized. No evidence of aortic valve sclerosis or stenosis. 11. The pulmonic valve was normal in structure. Pulmonic valve regurgitation  is not visualized. 12. Normal pulmonary artery systolic pressure. 13. The tricuspid regurgitant velocity is 2.12 m/s, and with an assumed right atrial pressure of 3 mmHg, the estimated right ventricular systolic pressure is normal at 21.0 mmHg. 14. The inferior vena cava is normal in size with greater than 50% respiratory variability, suggesting right atrial pressure of 3 mmHg.    05/28/2018: TTE Study Conclusions - HPI and indications: 441.01 Dissection of Aorta , Thoracic. - Left ventricle: The cavity size was normal. Systolic function was   normal. The estimated ejection fraction was in the range of 55%   to 60%. Wall motion was normal; there were no regional wall   motion abnormalities. Doppler parameters are consistent with   abnormal left ventricular relaxation (grade 1 diastolic   dysfunction). - Aortic valve: Trileaflet; mildly thickened leaflets. There was no   regurgitation. - Aortic root: There was evidence of aortic dissection flap noted   in the descending thoracic aorta (0.37cm flap posterior). Seen on   CT scan. - Mitral valve: Calcified annulus. - Tricuspid valve: There was mild regurgitation. - Pulmonary arteries: Systolic pressure was mildly increased. PA   peak pressure: 36 mm Hg (S). - Pericardium, extracardiac: A trivial pericardial effusion was   identified posterior to the heart   2D echo 07/19/2016 Study Conclusions - Left ventricle: Distal septal hypokinesis. The cavity size was   normal. Systolic function was normal. The estimated ejection   fraction was 55%. Wall motion was normal; there were no regional   wall motion abnormalities. Doppler parameters are consistent with   both elevated ventricular end-diastolic filling pressure and   elevated left atrial filling pressure. - Atrial septum: No  defect or patent foramen ovale was identified. - Pericardium, extracardiac: A trivial pericardial effusion was   identified posterior to the heart  Recent Labs: 01/29/2023: ALT 16; B Natriuretic Peptide 21.2; BUN 19; Creatinine, Ser 0.96; Hemoglobin 11.0; Platelets 331; Potassium 3.7; Sodium 141  04/17/2022: Cholesterol 177; HDL 53.00; LDL Cholesterol 106; Total CHOL/HDL Ratio 3; Triglycerides 89.0; VLDL 17.8   CrCl cannot be calculated (Unknown ideal weight.).   Wt Readings from Last 3 Encounters:  01/12/23 172 lb (78 kg)  01/02/23 172 lb (78 kg)  12/22/22 172 lb (78 kg)     Other studies reviewed: Additional studies/records reviewed today include: summarized above  ASSESSMENT AND PLAN:  1. Atrial fibrillation     CHA2DS2Vasc is 5 (including gender)      H/o provoked DVT/PE (post hip surgery)      Now/low burden by lack of symptoms  ***  I don't think we have any formal record of her Afib though she relates a clear and known longstanding diagnosis of this OFF a/c 2/2 recurrent hematuria  She has not wanted to consider alternative OAC, or resuming a/c Discussed monitoring options , wearable tech, Kardia, perhaps loop monitor. She would not want to pursue loop,   2. HTN     *** Looks OK    Disposition:     Current medicines are reviewed at length with the patient today.  The patient did not have any concerns regarding medicines.  Samantha Fredrickson, PA-C 02/11/2023 7:13 PM     [facility] [address] [place] [address] [phone] (office)  [phone] (fax)

## 2023-02-14 ENCOUNTER — Ambulatory Visit: Payer: Medicare Other | Attending: Student | Admitting: Physician Assistant

## 2023-02-14 ENCOUNTER — Encounter: Payer: Self-pay | Admitting: Physician Assistant

## 2023-02-14 VITALS — BP 126/90 | HR 74 | Ht 62.0 in | Wt 173.2 lb

## 2023-02-14 DIAGNOSIS — I480 Paroxysmal atrial fibrillation: Secondary | ICD-10-CM | POA: Diagnosis not present

## 2023-02-14 DIAGNOSIS — R0602 Shortness of breath: Secondary | ICD-10-CM

## 2023-02-14 DIAGNOSIS — I10 Essential (primary) hypertension: Secondary | ICD-10-CM | POA: Diagnosis not present

## 2023-02-14 MED ORDER — ROSUVASTATIN CALCIUM 20 MG PO TABS: ORAL_TABLET | ORAL | 3 refills | Status: DC

## 2023-02-14 MED ORDER — FUROSEMIDE 20 MG PO TABS: 20.0000 mg | ORAL_TABLET | Freq: Every day | ORAL | 3 refills | Status: AC

## 2023-02-14 NOTE — Patient Instructions (Addendum)
Medication Instructions:   Your physician recommends that you continue on your current medications as directed. Please refer to the Current Medication list given to you today.   *If you need a refill on your cardiac medications before your next appointment, please call your pharmacy*   Lab Work:  NONE ORDERED  TODAY    If you have labs (blood work) drawn today and your tests are completely normal, you will receive your results only by: MyChart Message (if you have MyChart) OR A paper copy in the mail If you have any lab test that is abnormal or we need to change your treatment, we will call you to review the results.   Testing/Procedures: Your physician has requested that you have an echocardiogram. Echocardiography is a painless test that uses sound waves to create images of your heart. It provides your doctor with information about the size and shape of your heart and how well your heart's chambers and valves are working. This procedure takes approximately one hour. There are no restrictions for this procedure. Please do NOT wear cologne, perfume, aftershave, or lotions (deodorant is allowed). Please arrive 15 minutes prior to your appointment time.    Follow-Up: At [facility], you and your health needs are our priority.  As part of our continuing mission to provide you with exceptional heart care, we have created designated Provider Care Teams.  These Care Teams include your primary Cardiologist (physician) and Advanced Practice Providers (APPs -  Physician Assistants and Nurse Practitioners) who all work together to provide you with the care you need, when you need it.  We recommend signing up for the patient portal called "MyChart".  Sign up information is provided on this After Visit Summary.  MyChart is used to connect with patients for Virtual Visits (Telemedicine).  Patients are able to view lab/test results, encounter notes, upcoming appointments, etc.  Non-urgent  messages can be sent to your provider as well.   To learn more about what you can do with MyChart, go to [email or web address].    Your next appointment:   6 month(s)  Provider:   Loman Brooklyn, MD or Francis Dowse, PA-C    Other Instructions

## 2023-03-16 ENCOUNTER — Ambulatory Visit (HOSPITAL_COMMUNITY): Payer: Medicare Other | Attending: Cardiology

## 2023-03-16 DIAGNOSIS — I480 Paroxysmal atrial fibrillation: Secondary | ICD-10-CM | POA: Diagnosis not present

## 2023-03-16 LAB — ECHOCARDIOGRAM COMPLETE
Area-P 1/2: 4.39 cm2
Calc EF: 59.3 %
S' Lateral: 2.9 cm
Single Plane A2C EF: 63.7 %
Single Plane A4C EF: 53.7 %

## 2023-03-30 IMAGING — US US RENAL
1 series · 13 of 25 positions shown · non-contrast
Comparison: CT abdomen and pelvis November 22, 2020

CLINICAL DATA: Hematuria

EXAM:
RENAL / URINARY TRACT ULTRASOUND COMPLETE

[Series 1: us renal · 0.26mm/px · 13 of 51 slices shown]
[im 1/51]
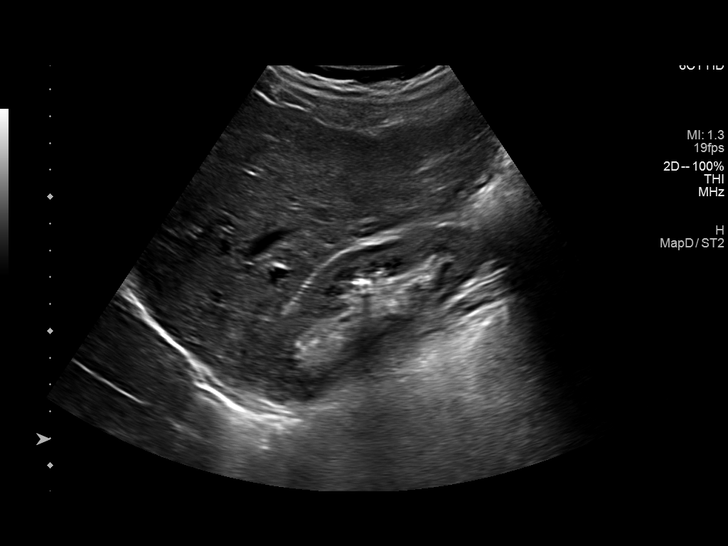
[im 5/51]
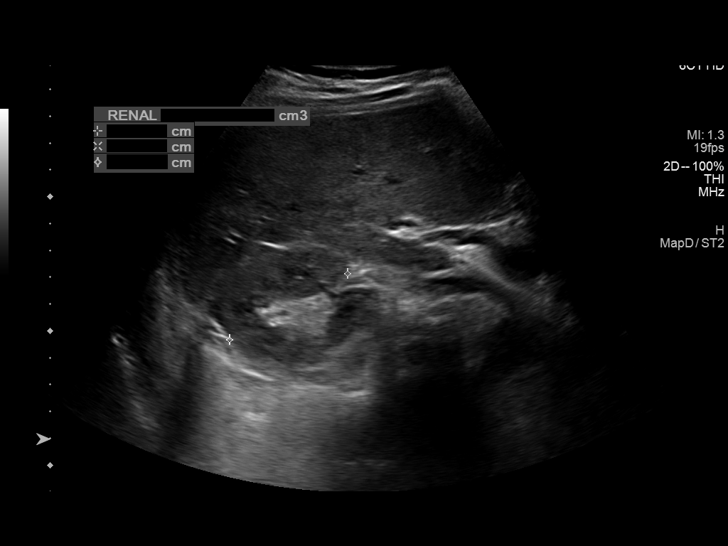
[im 9/51]
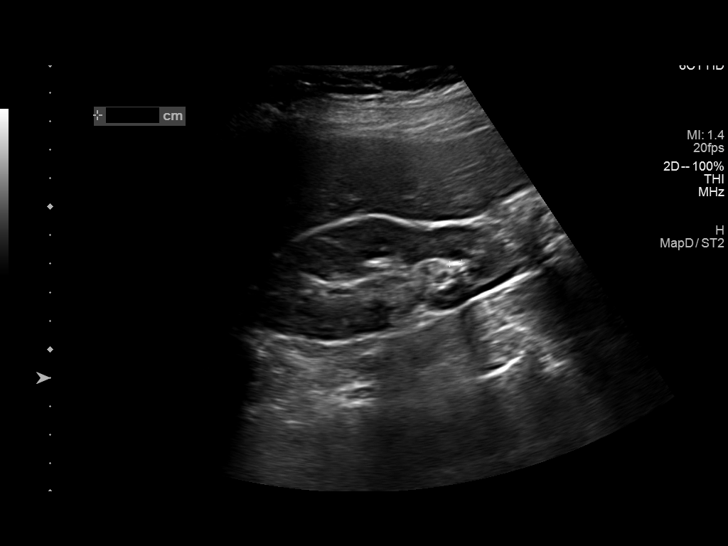
[im 13/51]
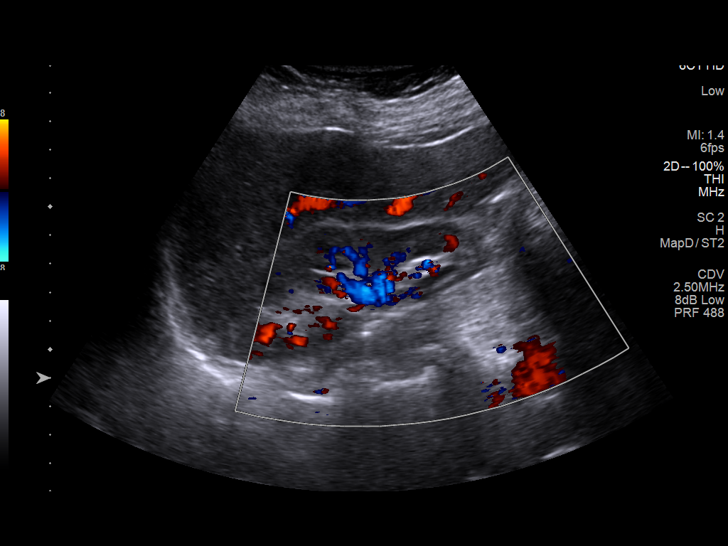
[im 17/51]
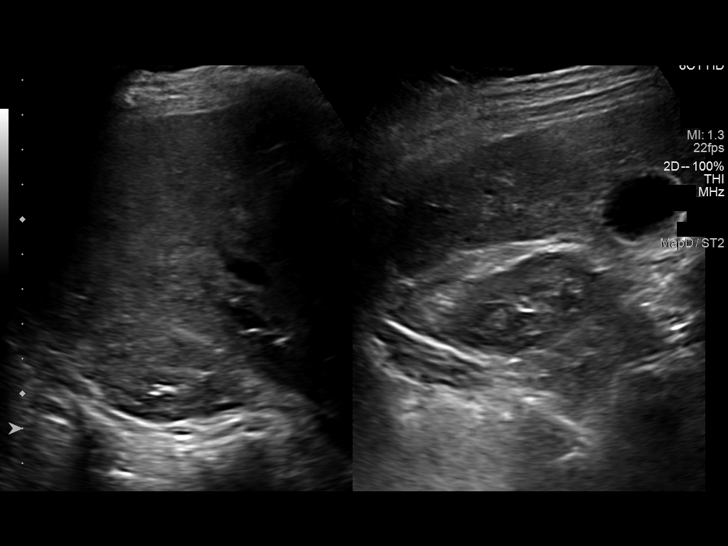
[im 21/51]
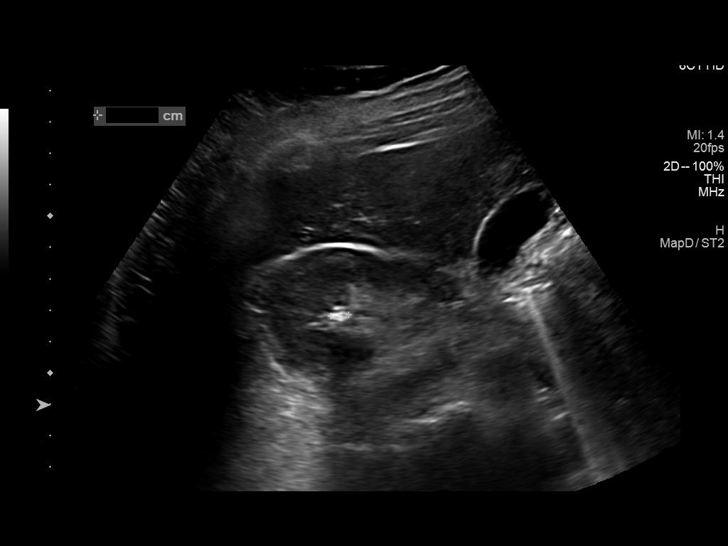
[im 26/51]
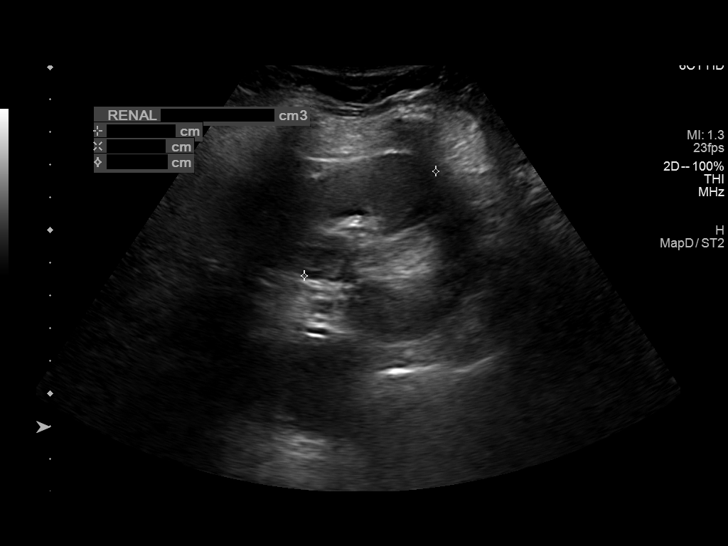
[im 30/51]
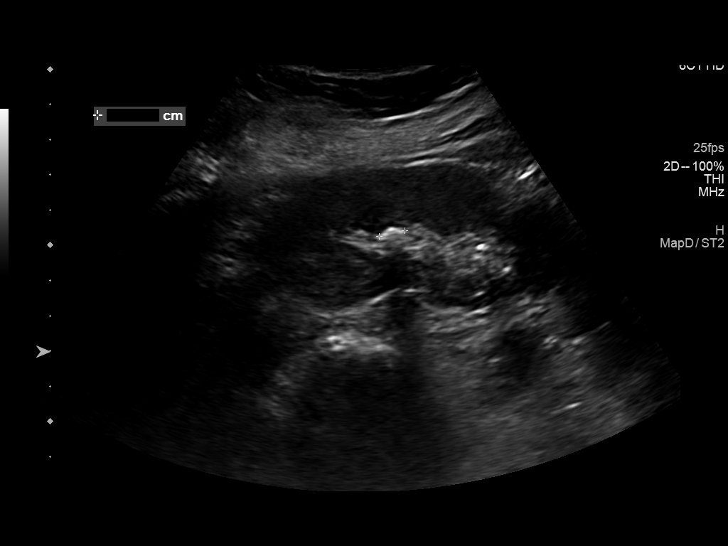
[im 34/51]
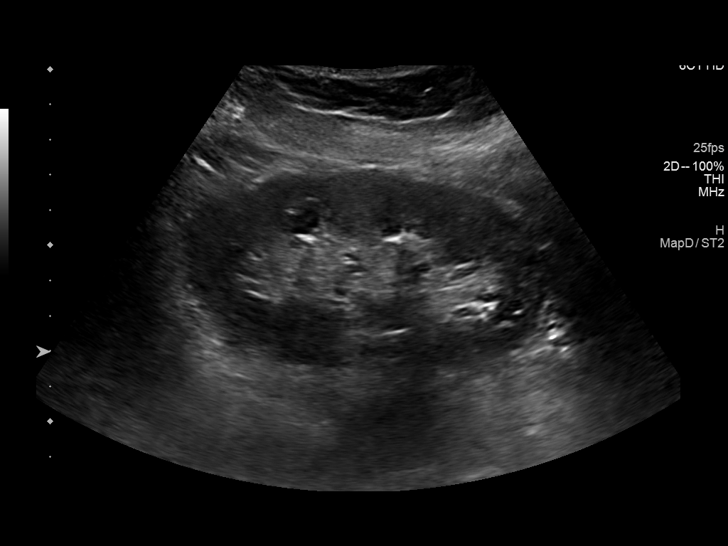
[im 38/51]
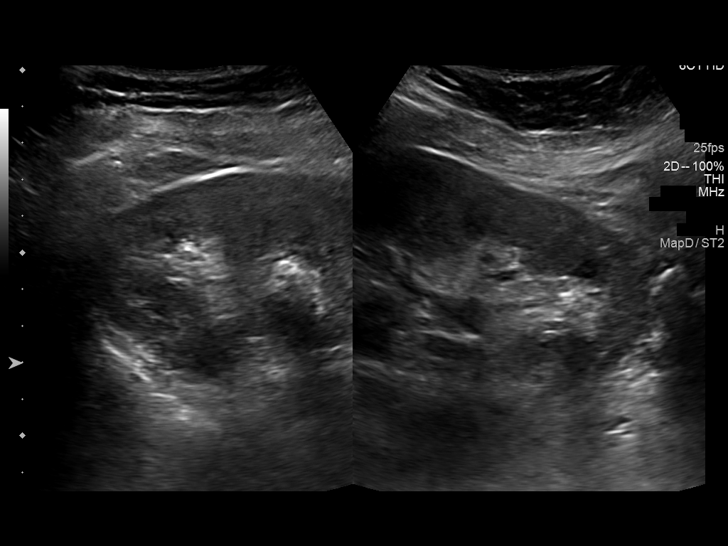
[im 42/51]
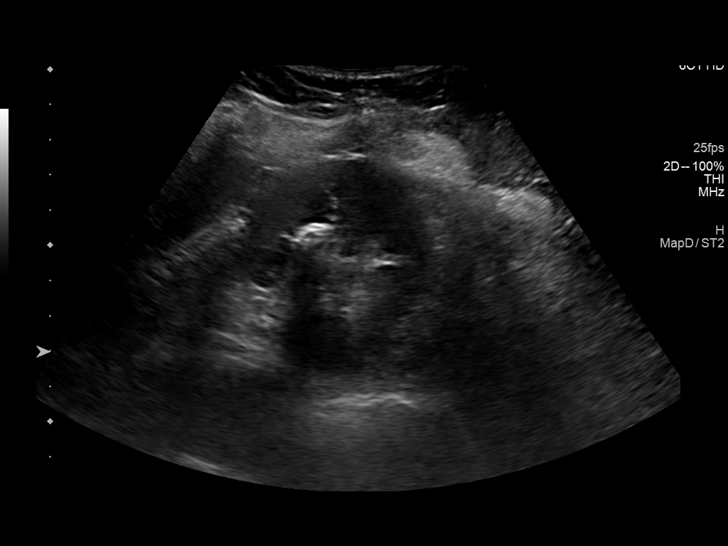
[im 46/51]
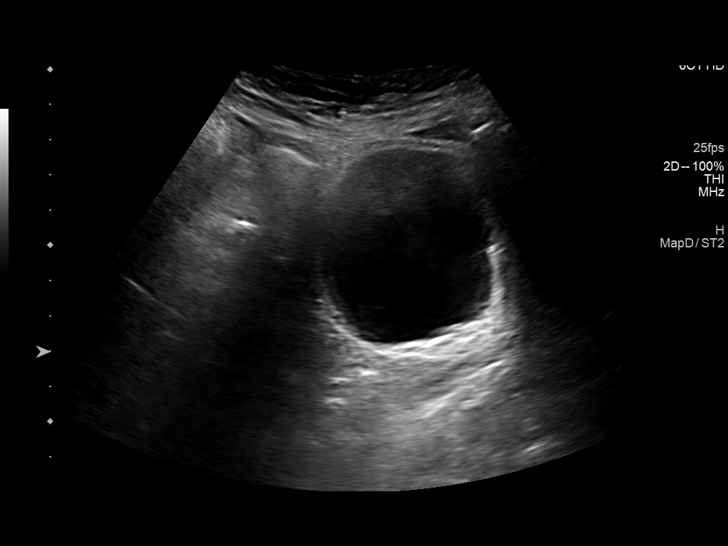
[im 51/51]
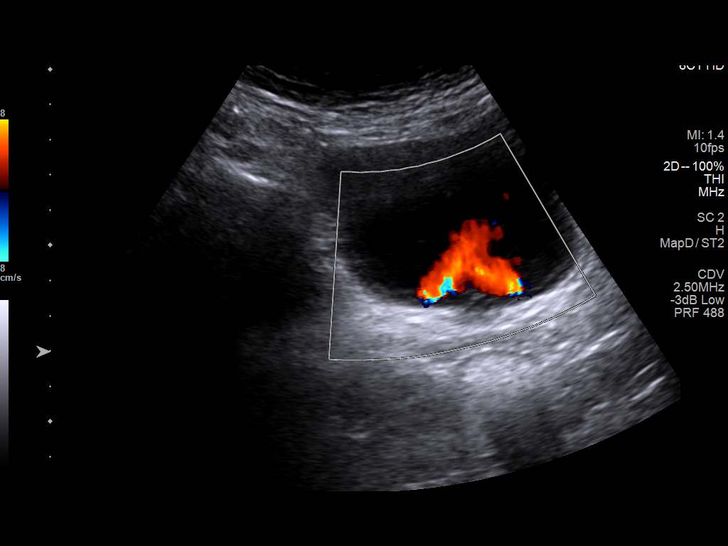

[13 of 25 positions shown; findings below may reference images not displayed]

FINDINGS: Right Kidney:

Renal measurements: 9.9 x 3.9 x 5.0 cm = volume: 101 mL.
Echogenicity and renal cortical thickness are within normal limits.
No mass, perinephric fluid, or hydronephrosis visualized. There are
several echogenic foci in the right kidney consistent with calculi.
Largest calculus is noted in the mid right kidney measuring 6 mm in
diameter. A 4 mm calculus is noted in the upper pole region. 3 mm
apparent calculus in lower pole right kidney. No ureterectasis.

Left Kidney:

Renal measurements: 11.9 x 6.0 x 5.2 cm = volume: 192 mL.
Echogenicity and renal cortical thickness are within normal limits.
No mass, perinephric fluid, or hydronephrosis visualized. Several
echogenic foci noted in the left kidney consistent with calculi.
Largest calculus noted in mid left kidney measuring 1.1 cm. 5 mm
calculus lower pole left kidney. 6 mm calculus upper to mid left
kidney. Smaller suspected calculi also present on the left. No
ureterectasis.

Bladder:

Appears normal for degree of bladder distention. Flow from each
distal ureter seen within the bladder.

Other:

None.
IMPRESSION: Calculi in each kidney, more numerous and larger on the left than on
the right. No obstructing focus evident in either kidney. Kidneys
otherwise appear normal bilaterally. Flow from each distal ureter
seen within the bladder.

## 2023-04-23 ENCOUNTER — Other Ambulatory Visit: Payer: Self-pay | Admitting: Internal Medicine

## 2023-04-23 DIAGNOSIS — I480 Paroxysmal atrial fibrillation: Secondary | ICD-10-CM

## 2023-04-24 DIAGNOSIS — I480 Paroxysmal atrial fibrillation: Secondary | ICD-10-CM

## 2023-04-24 MED ORDER — DILTIAZEM HCL ER 240 MG PO CP24
240.0000 mg | ORAL_CAPSULE | Freq: Every morning | ORAL | 1 refills | Status: DC
Start: 2023-04-24 — End: 2023-08-17

## 2023-04-24 NOTE — Telephone Encounter (Signed)
Per Almyra Free, updated PCP in care team.

## 2023-04-24 NOTE — Telephone Encounter (Signed)
Diltiazem 240 mg refilled. 

## 2023-05-25 ENCOUNTER — Encounter: Payer: Self-pay | Admitting: Family Medicine

## 2023-05-25 ENCOUNTER — Ambulatory Visit (INDEPENDENT_AMBULATORY_CARE_PROVIDER_SITE_OTHER): Payer: Medicare Other | Admitting: Family Medicine

## 2023-05-25 VITALS — BP 118/78 | HR 76 | Temp 97.8°F | Ht 63.0 in | Wt 173.6 lb

## 2023-05-25 DIAGNOSIS — R3989 Other symptoms and signs involving the genitourinary system: Secondary | ICD-10-CM

## 2023-05-25 DIAGNOSIS — I480 Paroxysmal atrial fibrillation: Secondary | ICD-10-CM | POA: Diagnosis not present

## 2023-05-25 DIAGNOSIS — I5032 Chronic diastolic (congestive) heart failure: Secondary | ICD-10-CM

## 2023-05-25 DIAGNOSIS — M6289 Other specified disorders of muscle: Secondary | ICD-10-CM

## 2023-05-25 DIAGNOSIS — K5909 Other constipation: Secondary | ICD-10-CM

## 2023-05-25 DIAGNOSIS — R600 Localized edema: Secondary | ICD-10-CM | POA: Diagnosis not present

## 2023-05-25 DIAGNOSIS — I7100 Dissection of unspecified site of aorta: Secondary | ICD-10-CM

## 2023-05-25 DIAGNOSIS — I10 Essential (primary) hypertension: Secondary | ICD-10-CM

## 2023-05-25 DIAGNOSIS — Z86711 Personal history of pulmonary embolism: Secondary | ICD-10-CM

## 2023-05-25 LAB — POCT URINALYSIS DIPSTICK
Bilirubin, UA: NEGATIVE
Blood, UA: NEGATIVE
Glucose, UA: NEGATIVE
Ketones, UA: NEGATIVE
Leukocytes, UA: NEGATIVE
Nitrite, UA: NEGATIVE
Protein, UA: POSITIVE — AB
Spec Grav, UA: >=1.03 — AB (ref 1.010–1.025)
Urobilinogen, UA: NEGATIVE E.U./dL — AB
pH, UA: 6 (ref 5.0–8.0)

## 2023-05-25 NOTE — Patient Instructions (Addendum)
Your urine looked good.  No signs of infection.  You should drink a little more water as it was concentrated.  As things are stable we will have you schedule follow-up as needed.

## 2023-05-25 NOTE — Progress Notes (Signed)
Established Patient Office Visit   Subjective  Patient ID: Samantha Stevenson, female    DOB: 12-Jul-1951  Age: 72 y.o. MRN: [id number]  Chief Complaint  Patient presents with   Establish Care    Transfer of care from Dr Okey Dupre.     Patient is a 72 year old female with pmh sig for HLD, HTN, A-fib, diastolic CHF, history of MI, history of PE, intermural aortic hematoma who was previously seen by Hillard Danker and presents for [facility].  HTN, A-fib, chronic diastolic heart failure: Followed by cardiology.  On diltiazem XR to 40 mg, Lasix 20 mg, Crestor 20 mg.  LE edema: Wears compression socks in the winter but states it is too hot to wear them during the summer.  Also states it is hard to get compression socks off and on without help.  Elevates legs.  Mindful of sodium intake.  Patient states she is up all night urinating.  Was advised to take Lasix 20 mg nightly.  States prescription was changed to nightly sometime after hospitalization.  Chronic constipation/bowel issues: Takes Metamucil and Linzess.  Patient also mentions she is followed by nephrology.  Pt notes intermittent pain with urination x months.  Denies dysuria, suprapubic pressure, nausea, vomiting, back pain, urinary frequency or urgency.  Patient states in the past she was checked for prolapse and did not have it.    Past Medical History:  Diagnosis Date   Allergy    Anemia    Arthritis    "qwhere" (07/29/2018)   Atrial fibrillation (HCC)    CHF (congestive heart failure) (HCC)    Clotting disorder (HCC)    Degenerative arthritis of hip    s/p L THR 12/2012   Dyspnea    since surgery in August 2019- "when I get worked up and walk a short distance"   Dysrhythmia    History of blood transfusion 05/2018   "related to OR"   Hyperlipidemia    Hypertension    Intramural hematoma of thoracic aorta (HCC) 06/13/2018   Migraines    mIgraines- none since blood pressure and lipids are under control   Myocardial  infarction (HCC) 2000   due to atrial fib   Neuromuscular disorder (HCC)    pinched nerve- left side of neck   Pulmonary emboli (HCC) 12/2012 dx   post op (L THR), anticoag x 66mo   Past Surgical History:  Procedure Laterality Date   ABDOMINAL HYSTERECTOMY     "w/1 tube"   ANTERIOR CERVICAL DECOMPRESSION/DISCECTOMY FUSION 4 LEVELS N/A 02/02/2020   Procedure: CERVICAL THREE-FOUR, CERVICAL FOUR-FIVE, CERVICAL FIVE-SIX, CERVICAL SIX-SEVEN ANTERIOR CERVICAL DECOMPRESSION/DISCECTOMY FUSION;  Surgeon: Julio Sicks, MD;  Location: [facility] OR;  Service: Neurosurgery;  Laterality: N/A;   ARTERY REPAIR Left 08/15/2018   Procedure: LEFT BRACHIAL ARTERY EXPLORATION;  Surgeon: Maeola Harman, MD;  Location: [facility] OR;  Service: Vascular;  Laterality: Left;   Breast Ultrasound Left 04/16/2013   Done @ breast center Impression: no malignancy appearance noted on the screen study is consistent with a summation shadow   BUNIONECTOMY Left    CARDIAC CATHETERIZATION     CAROTID-SUBCLAVIAN BYPASS GRAFT Left 06/13/2018   Procedure: BYPASS GRAFT CAROTID-SUBCLAVIAN;  Surgeon: Maeola Harman, MD;  Location: [facility] OR;  Service: Vascular;  Laterality: Left;   COLONOSCOPY     CYSTOSCOPY WITH RETROGRADE PYELOGRAM, URETEROSCOPY AND STENT PLACEMENT Bilateral 11/02/2020   Procedure: DIAGNOSTIC CYSTOSCOPY WITH RETROGRADE PYELOGRAM,BILATERAL DIAGNOSTIC URETEROSCOPY  AND BILATERAL STENT PLACEMENT;  Surgeon: Noel Christmas, MD;  Location: [facility];  Service: Urology;  Laterality: Bilateral;  90 MINS   DG TUMB RIGHT HAND Right    cyst removal   IR THORACENTESIS ASP PLEURAL SPACE W/IMG GUIDE  05/31/2018   JOINT REPLACEMENT     KNEE ARTHROSCOPY Left 12/16/2020   Procedure: LEFT KNEE ARTHROSCOPY WITH PARTIAL LATERAL MENISCECTOMY;  Surgeon: Kathryne Hitch, MD;  Location: [facility];  Service: Orthopedics;  Laterality: Left;   THORACIC AORTIC ENDOVASCULAR STENT GRAFT Left  06/13/2018   Procedure: LEFT  SUBCLAVIAN TO CAROTID ARTERY TRANSPOSITION; THORACIC AORTIC ENDOVASCULAR STENT GRAFT using GORE CONFORMABLE THORACIC STENT GRAFT AND GORE TAG THORACIC ENDOPROSTHESIS; STENT LEFT COMMON CAROTID ARTERY, RIGHT COMMON-FEMORAL ENDARTERECTOMY WITH PATCH ANGIOPLASTY;  Surgeon: Maeola Harman, MD;  Location: [facility] OR;  Service: Vascular;  Laterality: Left;   TONSILLECTOMY     TOTAL HIP ARTHROPLASTY Left 01/03/2013   Procedure: TOTAL HIP ARTHROPLASTY ANTERIOR APPROACH;  Surgeon: Kathryne Hitch, MD;  Location: [facility];  Service: Orthopedics;  Laterality: Left;  Left Total Hip Arthroplasty, Anterior Approach   TUBAL LIGATION     VAGINA RECONSTRUCTION SURGERY  1966   vagina had closed up when 73 years old   WOUND EXPLORATION Left 06/16/2018   Procedure: LEFT NECK WOUND EXPLORATION, CHEST TUBE INSERTION;  Surgeon: Maeola Harman, MD;  Location: [facility] OR;  Service: Vascular;  Laterality: Left;   Social History   Tobacco Use   Smoking status: Never   Smokeless tobacco: Never  Vaping Use   Vaping status: Never Used  Substance Use Topics   Alcohol use: Not Currently   Drug use: Never   Family History  Problem Relation Age of Onset   Heart disease Father    Diabetes Mother    Heart disease Mother        pacemaker   Heart attack Mother 83   Diabetes Sister    Alcohol abuse Other    Heart disease Other    Hyperlipidemia Other    Hypertension Other    Diabetes Other    Breast cancer Other    Colon cancer Neg Hx    Esophageal cancer Neg Hx    Stomach cancer Neg Hx    Rectal cancer Neg Hx    Allergies  Allergen Reactions   Penicillins Swelling and Other (See Comments)    Severe swelling PATIENT HAS HAD A PCN REACTION WITH IMMEDIATE RASH, FACIAL/TONGUE/THROAT SWELLING, SOB, OR LIGHTHEADEDNESS WITH HYPOTENSION:  #  #  YES  #  #  Has patient had a PCN reaction causing severe rash involving mucus membranes or skin necrosis: No PATIENT HAS HAD A  PCN REACTION THAT REQUIRED HOSPITALIZATION:  #  #  YES  #  #  Has patient had a PCN reaction occurring within the last 10 years: No   Shellfish Allergy Anaphylaxis   Latex Hives and Rash   Banana Nausea Only   Kiwi Extract    Lactose Intolerance (Gi) Diarrhea and Nausea And Vomiting   Oxycodone Other (See Comments)    Pt stated, "Made me feel really drowsy" Tolerating as of 02/16/20 after neck surgery   Asa [Aspirin] Nausea And Vomiting    Makes stomach upset Asprin 325   Celebrex [Celecoxib] Nausea And Vomiting    GI upset   Nsaids Rash    ROS Negative unless stated above    Objective:     BP 118/78 (BP Location: Left Arm, Patient Position: Sitting, Cuff Size: Large)   Pulse 76   Temp  97.8 F (36.6 C) (Oral)   Ht 5\' 3"  (1.6 m)   Wt 173 lb 9.6 oz (78.7 kg)   SpO2 96%   BMI 30.75 kg/m  BP Readings from Last 3 Encounters:  05/25/23 118/78  02/14/23 (!) 126/90  01/29/23 126/64   Wt Readings from Last 3 Encounters:  05/25/23 173 lb 9.6 oz (78.7 kg)  02/14/23 173 lb 3.2 oz (78.6 kg)  01/12/23 172 lb (78 kg)      Physical Exam Constitutional:      General: She is not in acute distress.    Appearance: Normal appearance.  HENT:     Head: Normocephalic and atraumatic.     Nose: Nose normal.     Mouth/Throat:     Mouth: Mucous membranes are moist.  Cardiovascular:     Rate and Rhythm: Normal rate and regular rhythm.     Heart sounds: Normal heart sounds. No murmur heard.    No gallop.     Comments: Bilateral nonpitting edema to mid shin. Pulmonary:     Effort: Pulmonary effort is normal. No respiratory distress.     Breath sounds: Normal breath sounds. No wheezing, rhonchi or rales.  Abdominal:     General: Bowel sounds are normal.     Palpations: Abdomen is soft.     Tenderness: There is no abdominal tenderness.  Skin:    General: Skin is warm and dry.  Neurological:     Mental Status: She is alert and oriented to person, place, and time.      Results  for orders placed or performed in visit on 05/25/23  POCT urinalysis dipstick  Result Value Ref Range   Color, UA yellow    Clarity, UA clear    Glucose, UA Negative Negative   Bilirubin, UA neg    Ketones, UA neg    Spec Grav, UA >=1.030 (A) 1.010 - 1.025   Blood, UA neg    pH, UA 6.0 5.0 - 8.0   Protein, UA Positive (A) Negative   Urobilinogen, UA negative (A) 0.2 or 1.0 E.U./dL   Nitrite, UA neg    Leukocytes, UA Negative Negative   Appearance     Odor        Assessment & Plan:  Bladder pain -Discussed possible causes of symptoms. -POC UA without signs of infection.  Patient encouraged to increase p.o. intake of water. -Pelvic floor dysfunction likely contributing to symptoms.  Discussed treatment including exercises and pelvic floor PT. -     POCT urinalysis dipstick; Future  Paroxysmal atrial fibrillation (HCC) -Controlled -Continue diltiazem XR 240 mg daily, aspirin 81 mg daily  Chronic diastolic CHF (congestive heart failure) (HCC) -Echo from 03/16/2023 with EF 60-65%, grade 1 diastolic dysfunction -Continue current medications including diltiazem XR to 40 mg daily, Lasix 20 mg daily -Continue Crestor 20 mg daily -Continue lifestyle modifications including low-sodium diet, monitoring fluid intake, and daily weights -Continue follow-up with cardiology  Primary hypertension -Controlled -Continue lifestyle modifications -Continue current medication including diltiazem XR to 40 mg daily  History of pulmonary embolism  Intramural aortic hematoma (HCC)  Bilateral leg edema -Likely 2/2 CHF -Discussed the importance of sodium restricted diet, elevating LEs, compression socks, etc. -Continue Lasix 20 mg  Pelvic floor dysfunction  Chronic constipation -Continue Metamucil and Linzess -Continue follow-up with GI   Return if symptoms worsen or fail to improve.   Deeann Saint, MD

## 2023-07-11 IMAGING — CT CT ANGIO CHEST-ABD-PELV FOR DISSECTION W/ AND WO/W CM
2 of 5 series · 11 of 32 positions shown, 16 images · IV contrast (APPLIED)
Comparison: Prior CT scan of the chest [DATE]

CLINICAL DATA: 70-year-old female with a history of thoracic aortic
endovascular repair and left carotid subclavian bypass.

EXAM:
CT ANGIOGRAPHY CHEST, ABDOMEN AND PELVIS
TECHNIQUE: Non-contrast CT of the chest was initially obtained.

[Series 3: wo 2.0 i70s 3 · axial · 0.78mm/px · z∈[-249,-61]mm · 5 of 142 slices shown, 10 images]
[im 24/142  soft-tissue]
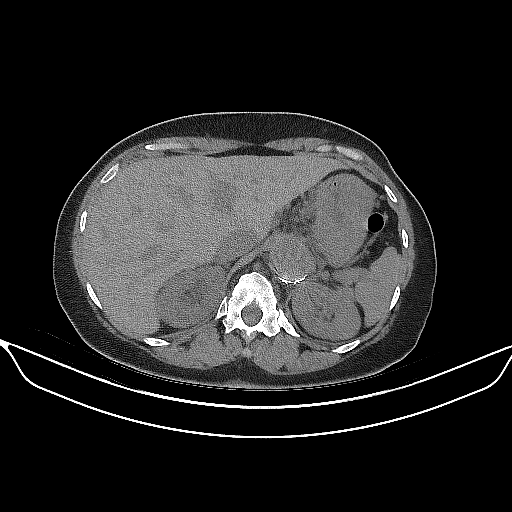
[im 24/142  bone]
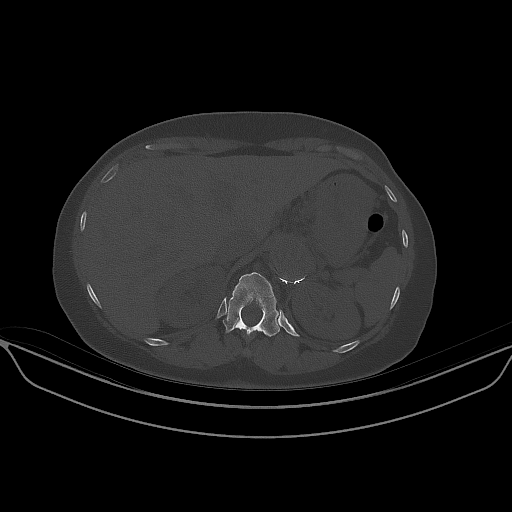
[im 48/142  soft-tissue]
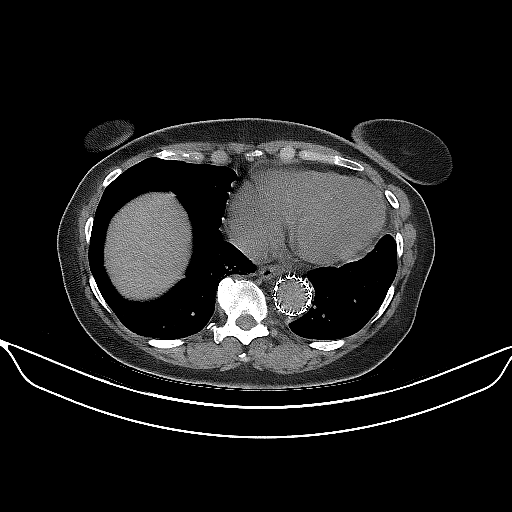
[im 48/142  lung]
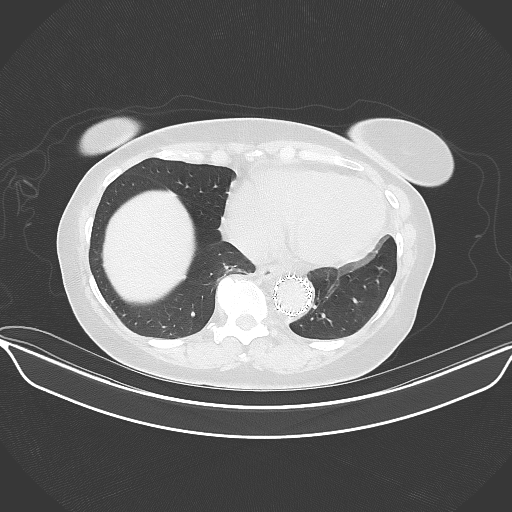
[im 71/142  soft-tissue]
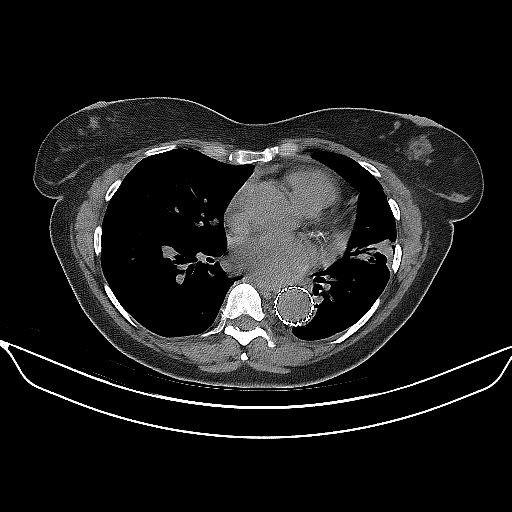
[im 71/142  lung]
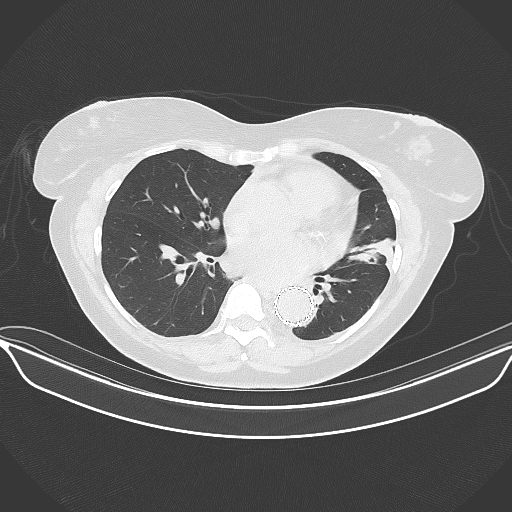
[im 95/142  soft-tissue]
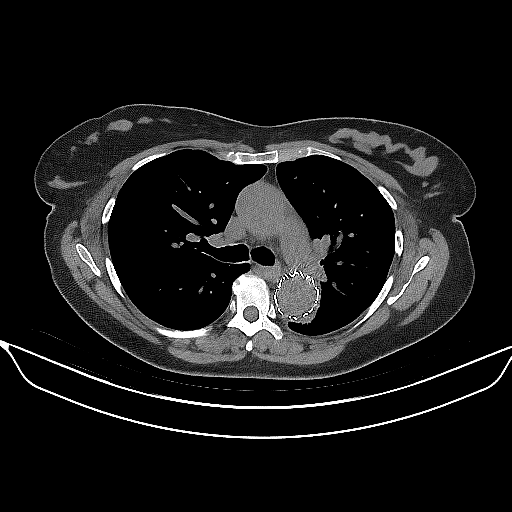
[im 95/142  lung]
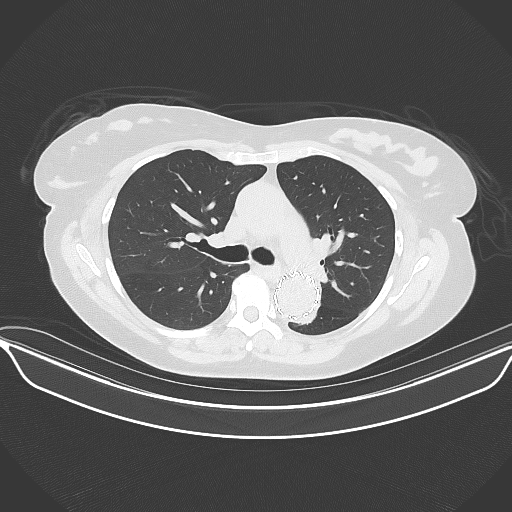
[im 118/142  soft-tissue]
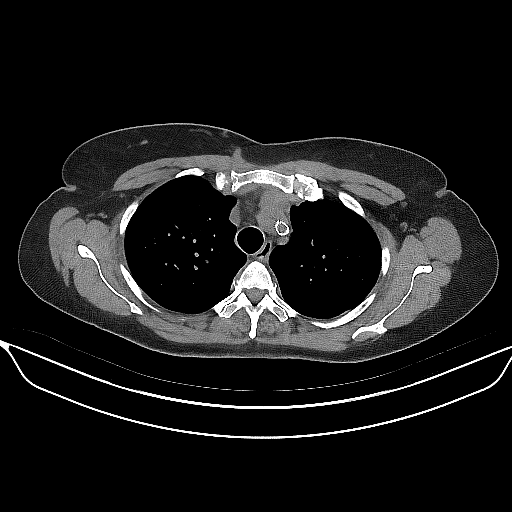
[im 118/142  lung]
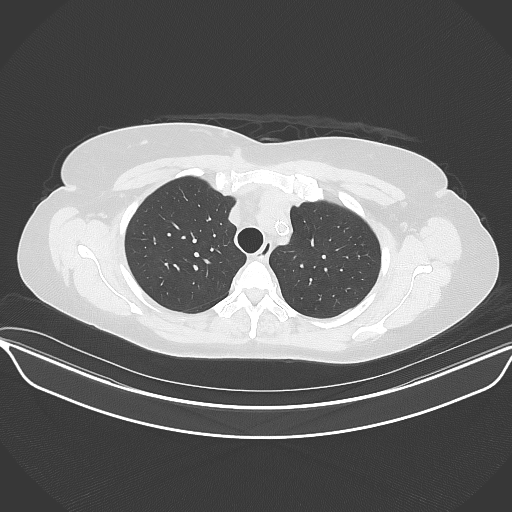

[Series 7: angio · axial · 0.82mm/px · z∈[-537,-181]mm · 6 of 291 slices shown]
[im 23/291  soft-tissue]
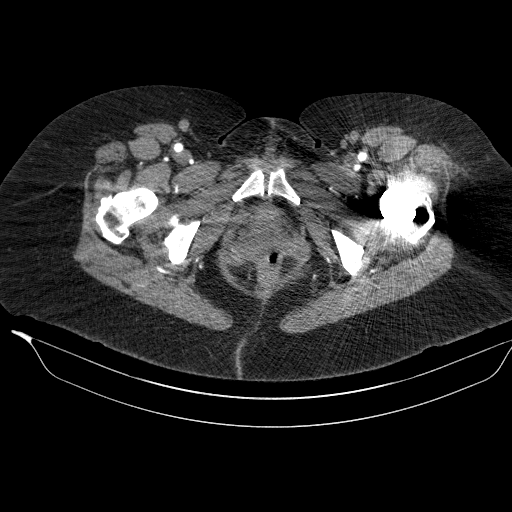
[im 67/291  soft-tissue]
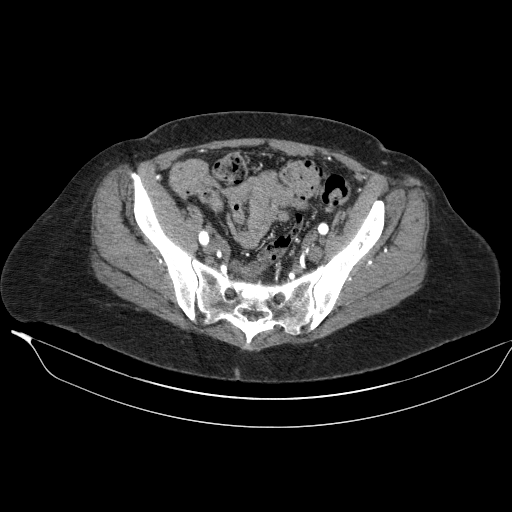
[im 90/291  soft-tissue]
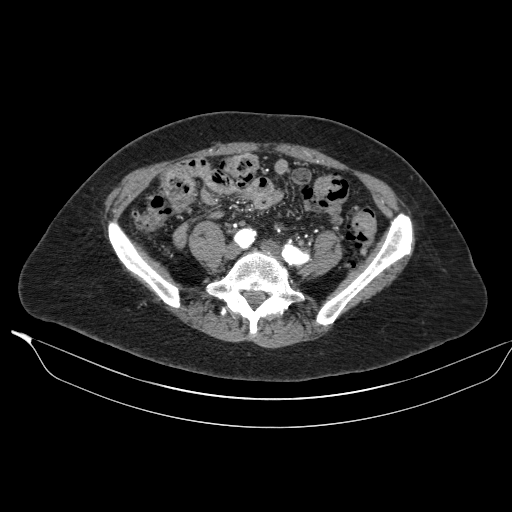
[im 134/291  soft-tissue]
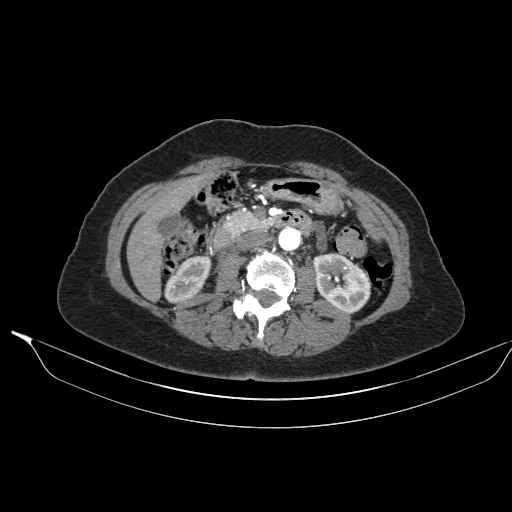
[im 157/291  soft-tissue]
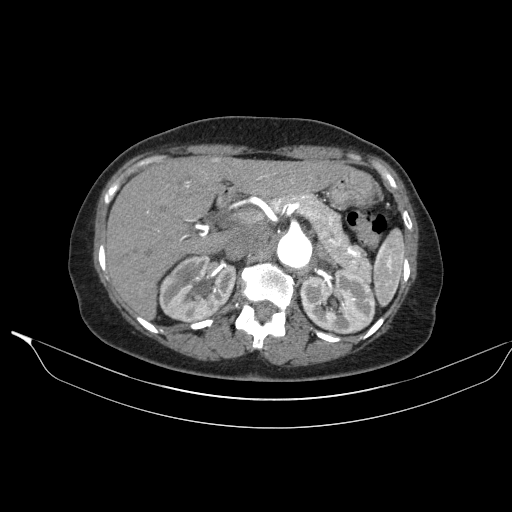
[im 201/291  soft-tissue]
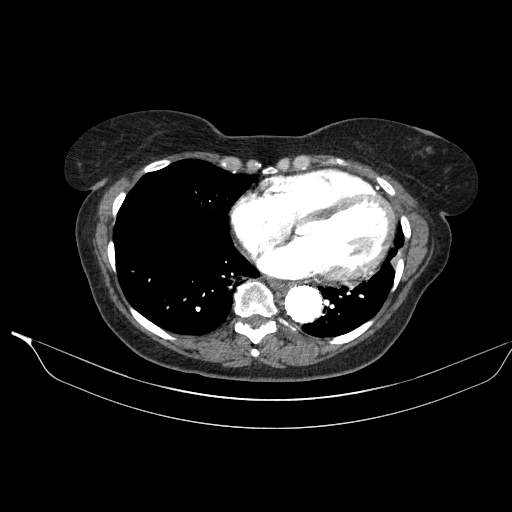

[11 of 32 positions shown; findings below may reference images not displayed]

Multidetector CT imaging through the chest, abdomen and pelvis was
performed using the standard protocol during bolus administration of
intravenous contrast. Multiplanar reconstructed images and MIPs were
obtained and reviewed to evaluate the vascular anatomy.

CONTRAST:  75mL GZQFMZ-37C IOPAMIDOL (GZQFMZ-37C) INJECTION 76%
FINDINGS: CTA CHEST FINDINGS

Cardiovascular: No evidence of acute intramural hematoma on the
initial noncontrast enhanced images. The aortic root and ascending
thoracic aorta remain normal in caliber. Thoracic aortic endograft
remains widely patent beginning in the aortic isthmus covering the
origin of the native left subclavian artery and extending throughout
the descending thoracic aorta. A left common carotid artery stent
remains widely patent as does the surgical changes of prior carotid
subclavian bypass. The left subclavian artery opacifies well with
contrast. No evidence of residual dissection flap. Mild
cardiomegaly. Unremarkable main and central pulmonary arteries. No
pericardial effusion. Calcifications throughout the coronary
arteries.

Mediastinum/Nodes: Unremarkable CT appearance of the thyroid gland.
No suspicious mediastinal or hilar adenopathy. No soft tissue
mediastinal mass. The thoracic esophagus is unremarkable.

Lungs/Pleura: Atelectasis versus scarring in the inferior lingula,
unchanged. Lungs are otherwise clear. No pleural effusion or
pneumothorax.

Musculoskeletal: No acute fracture or aggressive appearing lytic or
blastic osseous lesion.

Review of the MIP images confirms the above findings.

CTA ABDOMEN AND PELVIS FINDINGS

VASCULAR

Aorta: Mild aneurysmal dilation of the suprarenal abdominal aorta
with a maximal diameter of 3.3 cm at the origin of the celiac axis.
The juxtarenal and infrarenal abdominal aorta are normal in caliber.

Celiac: Stable ectasia of the proximal celiac axis with a maximal
diameter of 9 mm. Conventional hepatic arterial anatomy.

SMA: Patent without evidence of aneurysm, dissection, vasculitis or
significant stenosis.

Renals: Both renal arteries are patent without evidence of aneurysm,
dissection, vasculitis, fibromuscular dysplasia or significant
stenosis.

IMA: Patent without evidence of aneurysm, dissection, vasculitis or
significant stenosis.

Inflow: Ectatic common iliac arteries bilaterally without focal
aneurysmal dilation. Mild aneurysmal dilation of the left internal
iliac artery with a maximal diameter of 1.2 cm.

Veins: No focal venous abnormality.

Review of the MIP images confirms the above findings.

NON-VASCULAR

Hepatobiliary: Stable small cysts. Normal contour and morphology. No
enhancing lesion. Gallbladder is unremarkable. No intra or
extrahepatic biliary ductal dilatation.

Pancreas: Stable chronic dilation of the main pancreatic duct to 4
mm. No definite mass or distal obstruction. No atrophy.

Spleen: Normal in size without focal abnormality.

Adrenals/Urinary Tract: New right-sided hydronephrosis secondary to
a 5 mm obstructing stone in the mid right ureter. Additional 5-6 mm
stone present in the right lower pole collecting system. 5 mm stone
in the interpolar collecting system on the right. No hydronephrosis
on the left. Approximately 7 mm stone in the upper pole collecting
system on the left. No enhancing renal mass. The bladder is
decompressed.

Stomach/Bowel: No evidence of obstruction or focal bowel wall
thickening. Normal appendix in the right lower quadrant. The
terminal ileum is unremarkable. Colonic diverticular disease without
CT evidence of active inflammation.

Lymphatic: No suspicious lymphadenopathy.

Reproductive: Status post hysterectomy. No adnexal masses.

Other: No abdominal wall hernia or abnormality. No abdominopelvic
ascites.

Musculoskeletal: No acute fracture or aggressive appearing lytic or
blastic osseous lesion.

Review of the MIP images confirms the above findings.
IMPRESSION: CTA CHEST

1. Stable TEVAR of aortic isthmus and descending thoracic aorta
without evidence of residual dissection or complication.
2. Patent left common carotid artery stent and left carotid
subclavian bypass without evidence of complication.
3. Mild cardiomegaly.
4. Extensive coronary artery calcifications.

CTA ABD/PELVIS

1. New right-sided hydronephrosis secondary to a 5 mm obstructing
stone in the right mid ureter.
2. Additional nonobstructing nephrolithiasis bilaterally.
3. Perhaps slight interval enlargement of visceral abdominal aortic
aneurysm now measuring up to 3.3 cm in maximal diameter.
4. Additional ancillary findings as above without significant
interval change.

These results will be called to the ordering clinician or
representative by the Radiologist Assistant, and communication
documented in the PACS or [REDACTED].

## 2023-08-14 NOTE — Progress Notes (Signed)
Cardiology Office Note Date:  08/14/2023  Patient ID:  Samantha, Stevenson June 05, 1951, MRN [id number] PCP:  Deeann Saint, MD  Electrophysiologist:  Dr. Elberta Fortis   Chief Complaint:     6 mo  History of Present Illness: Samantha Stevenson is a 72 y.o. 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   She saw Dr. Ova Freshwater 2017, at that time was referred to him for her Afib.  He mentions though having no records to review that actually showed her AFib, and that attempts to get them would be made, that she may benefit from a/c in the future if she does have AF.  She was c/o of some SOB and planned for an echo.  Echo then noted LVEF 55%, no WMA, no significant VHD, findings of elevated LVEDP and LA filling pressure.   August 2019, she developed CP, went to [facility], given h/o PE had CT chest done, noting  an acute intramural hematoma of the thoracic aorta extending from the left subclavian artery to the renal artery origins, her BP brought under strict control and her xarelto reversed  CTS and VVS evaluated noted with type B aortic intramural hematoma  06/13/2018 1.  Left subclavian to carotid artery transposition 2.  Percutaneous access right common femoral artery 3.  Thoracic endovascular endo-grafting with left subclavian artery coverage with 28 x 28 x 15 cm distal and 34 x 34 x 20 cm proximal Gore CTAG 4.  Left common carotid artery stent with 7 x 40 mm Innova distal an 8 x 40 mm Innova proximally 5.  Right common femoral endarterectomy with greater saphenous vein patch angioplasty 6.  Intravascular ultrasound thoracic aorta.  developed tachycardia/hypotension found with lymphatic leak  06/16/2018 1.  Exploration left neck wound placement of 10 flat drain 2.  Left sided 28 French chest tube placement  TTE done during this  stay noted LVEF 55-60%, no WMA, grade I DD, There was evidence of aortic dissection flap noted   in the descending thoracic aorta (0.37cm flap posterior). Seen on   CT scan. No significant VHD.  She saw Dr. Randie Heinz 05/16/2019 doing well from her surgical perspective.  There was mention of some hematuria that she had apparently had in thepast as well.  He mentioned to discuss wether she needed the Xarelto (noting on it for DVT/PE history , no mention of Afib hx), and asked the pt to f/u with her PMD, but did say she needed lifeling ASA therapy for her stents.  She was stable from a vascular perspective in review of her CT scan and planned for annual visit with him.  Looks like she saw her PMD Sept 2020 for constipation, neck pain and discussion about wanting to stop her xarelto.  She was recommended to discuss a/c with [place], noting CHA2DS2Vasc score of 5 and she recommended lifelong a/c  She saw Dr. Elberta Fortis 06/23/21, she had been struggling with hematuria, resolved off ASA and Xarelto, urologic evaluation did not find cause for bleeding, Xarelto resumed with recurrent bleeding, was off a/c, on ASA. She reported feeling well, no CP, no SOB No symptoms of Afib No bleeding on ASA  Planned for an annual visit   I saw her 08/08/22 She is doing very well Exercising, walking on the treadmill, some other exercises, goes to the gym sometimes as well. No CP,  palpitations, SOB No hematuria off Xarelto She feels confident she would know if she has had AFib Has not had any symptoms in years No near syncope or syncope. PMD does her labs I discussed that don't think we have any formal record of her Afib though she relates a clear and known longstanding diagnosis of this OFF a/c 2/2 recurrent hematuria She did not want to consider alternative OAC, or resuming a/c Discussed monitoring options  Wearable tech, Kardia, perhaps loop monitor. She would not want to pursue loop, but planned to look into the kardia  ER  visit 01/29/23 for CP, tightness radiating to her back and into both of her arms since this past Friday. It is associated with shortness of breath and worse on exertion. Is also worse with palpation. Unchanged EKG, neg trop x1, CT chest stable AO Offered observation admission, she declined. Suspect CP musculoskeletal K+ 3.7 BUN/Creat 19/0.96 BNP 21.2 WBC 5.5 H/H 11/36 Plts 331  I saw her 02/14/23 No ongoing CP She remains with the same DOE that goes back to 2019 post-op It is limiting, mentions she has had several tests/evaluations without clear cause. Is not worse, not better No palpitations No dizzy spells, near syncope or syncope. No rest SOB She reports seeing vascular services annually Offered to send her for PFTs, she will d/w her PMD any pulmonary evaluation/testing recommended Stress test done with this symptoms was good Will update her echo Perhaps get a CPX to better identify etiology of her SOB if echo doesn't solve it  Echo looked OK  TODAY She is doing quite well No CP, palpitations or cardiac awareness No SOB, DOE Walking regularly for exercise No near syncope or syncope    Past Medical History:  Diagnosis Date   Allergy    Anemia    Arthritis    "qwhere" (07/29/2018)   Atrial fibrillation (HCC)    CHF (congestive heart failure) (HCC)    Clotting disorder (HCC)    Degenerative arthritis of hip    s/p L THR 12/2012   Dyspnea    since surgery in August 2019- "when I get worked up and walk a short distance"   Dysrhythmia    History of blood transfusion 05/2018   "related to OR"   Hyperlipidemia    Hypertension    Intramural hematoma of thoracic aorta (HCC) 06/13/2018   Migraines    mIgraines- none since blood pressure and lipids are under control   Myocardial infarction (HCC) 2000   due to atrial fib   Neuromuscular disorder (HCC)    pinched nerve- left side of neck   Pulmonary emboli (HCC) 12/2012 dx   post op (L THR), anticoag x 45mo    Past  Surgical History:  Procedure Laterality Date   ABDOMINAL HYSTERECTOMY     "w/1 tube"   ANTERIOR CERVICAL DECOMPRESSION/DISCECTOMY FUSION 4 LEVELS N/A 02/02/2020   Procedure: CERVICAL THREE-FOUR, CERVICAL FOUR-FIVE, CERVICAL FIVE-SIX, CERVICAL SIX-SEVEN ANTERIOR CERVICAL DECOMPRESSION/DISCECTOMY FUSION;  Surgeon: Julio Sicks, MD;  Location: [facility] OR;  Service: Neurosurgery;  Laterality: N/A;   ARTERY REPAIR Left 08/15/2018   Procedure: LEFT BRACHIAL ARTERY EXPLORATION;  Surgeon: Maeola Harman, MD;  Location: [facility] OR;  Service: Vascular;  Laterality: Left;   Breast Ultrasound Left 04/16/2013   Done @ breast center Impression: no malignancy appearance noted on the screen study is consistent with a summation shadow   BUNIONECTOMY Left    CARDIAC CATHETERIZATION     CAROTID-SUBCLAVIAN BYPASS GRAFT Left 06/13/2018   Procedure: BYPASS  GRAFT CAROTID-SUBCLAVIAN;  Surgeon: Maeola Harman, MD;  Location: [facility] OR;  Service: Vascular;  Laterality: Left;   COLONOSCOPY     CYSTOSCOPY WITH RETROGRADE PYELOGRAM, URETEROSCOPY AND STENT PLACEMENT Bilateral 11/02/2020   Procedure: DIAGNOSTIC CYSTOSCOPY WITH RETROGRADE PYELOGRAM,BILATERAL DIAGNOSTIC URETEROSCOPY  AND BILATERAL STENT PLACEMENT;  Surgeon: Noel Christmas, MD;  Location: [facility] ;  Service: Urology;  Laterality: Bilateral;  90 MINS   DG TUMB RIGHT HAND Right    cyst removal   IR THORACENTESIS ASP PLEURAL SPACE W/IMG GUIDE  05/31/2018   JOINT REPLACEMENT     KNEE ARTHROSCOPY Left 12/16/2020   Procedure: LEFT KNEE ARTHROSCOPY WITH PARTIAL LATERAL MENISCECTOMY;  Surgeon: Kathryne Hitch, MD;  Location: [facility];  Service: Orthopedics;  Laterality: Left;   THORACIC AORTIC ENDOVASCULAR STENT GRAFT Left 06/13/2018   Procedure: LEFT  SUBCLAVIAN TO CAROTID ARTERY TRANSPOSITION; THORACIC AORTIC ENDOVASCULAR STENT GRAFT using GORE CONFORMABLE THORACIC STENT GRAFT AND GORE TAG THORACIC  ENDOPROSTHESIS; STENT LEFT COMMON CAROTID ARTERY, RIGHT COMMON-FEMORAL ENDARTERECTOMY WITH PATCH ANGIOPLASTY;  Surgeon: Maeola Harman, MD;  Location: [facility] OR;  Service: Vascular;  Laterality: Left;   TONSILLECTOMY     TOTAL HIP ARTHROPLASTY Left 01/03/2013   Procedure: TOTAL HIP ARTHROPLASTY ANTERIOR APPROACH;  Surgeon: Kathryne Hitch, MD;  Location: [facility] ORS;  Service: Orthopedics;  Laterality: Left;  Left Total Hip Arthroplasty, Anterior Approach   TUBAL LIGATION     VAGINA RECONSTRUCTION SURGERY  1966   vagina had closed up when 72 years old   WOUND EXPLORATION Left 06/16/2018   Procedure: LEFT NECK WOUND EXPLORATION, CHEST TUBE INSERTION;  Surgeon: Maeola Harman, MD;  Location: [facility] OR;  Service: Vascular;  Laterality: Left;    Current Outpatient Medications  Medication Sig Dispense Refill   acetaminophen (TYLENOL) 500 MG tablet Take 1,000 mg by mouth every 8 (eight) hours as needed for moderate pain.     aspirin EC 81 MG tablet Take 81 mg by mouth at bedtime.     B Complex Vitamins (VITAMIN B COMPLEX) TABS Take 1 tablet by mouth daily.     clindamycin (CLEOCIN T) 1 % lotion Apply 1 application. topically as needed.     diltiazem (DILACOR XR) 240 MG 24 hr capsule Take 1 capsule (240 mg total) by mouth every morning. 90 capsule 1   ferrous sulfate 325 (65 FE) MG tablet Take 325 mg by mouth daily with breakfast.     furosemide (LASIX) 20 MG tablet Take 1 tablet (20 mg total) by mouth at bedtime. 90 tablet 3   lidocaine (HM LIDOCAINE PATCH) 4 % Place 1 patch onto the skin daily. 30 patch 0   LINZESS 72 MCG capsule TAKE 1 CAPSULE BY MOUTH DAILY BEFORE BREAKFAST 30 capsule 2   METAMUCIL FIBER PO Take 1 Dose by mouth in the morning and at bedtime.     metroNIDAZOLE (METROCREAM) 0.75 % cream Apply 1 application. topically as needed.     Multiple Vitamin (MULTIVITAMIN) tablet Take 2 tablets by mouth daily. Gummies     polyethylene glycol powder (GLYCOLAX/MIRALAX) 17  GM/SCOOP powder Take 17 g by mouth daily. 3350 g 11   rosuvastatin (CRESTOR) 20 MG tablet TAKE 1 TABLET BY MOUTH EVERY DAY 90 tablet 3   Current Facility-Administered Medications  Medication Dose Route Frequency Provider Last Rate Last Admin   methylPREDNISolone acetate (DEPO-MEDROL) injection 80 mg  80 mg Intramuscular Once Richardean Sale, DO        Allergies:  Penicillins, Shellfish allergy, Latex, Banana, Kiwi extract, Lactose intolerance (gi), Oxycodone, Asa [aspirin], Celebrex [celecoxib], and Nsaids   Social History:  The patient  reports that she has never smoked. She has never used smokeless tobacco. She reports that she does not currently use alcohol. She reports that she does not use drugs.   Family History:  The patient's family history includes Alcohol abuse in an other family member; Breast cancer in an other family member; Diabetes in her mother, sister, and another family member; Heart attack (age of onset: 63) in her mother; Heart disease in her father, mother, and another family member; Hyperlipidemia in an other family member; Hypertension in an other family member.  ROS:  Please see the history of present illness.  All other systems are reviewed and otherwise negative.   PHYSICAL EXAM:  VS:  There were no vitals taken for this visit. BMI: There is no height or weight on file to calculate BMI. Well nourished, well developed, in no acute distress  HEENT: normocephalic, atraumatic  Neck: no JVD, carotid bruits or masses Cardiac:  RRR; no significant murmurs, no rubs, or gallops Lungs:  CTA b/l , no wheezing, rhonchi or rales  Abd: soft, nontender MS: no deformity or atrophy Ext:  no edema Skin: warm and dry, no rash Neuro:  No gross deficits appreciated Psych: euthymic mood, full affect    EKG:  not done today   03/16/23: TTE 1. Left ventricular ejection fraction, by estimation, is 60 to 65%. The  left ventricle has normal function. The left ventricle has no  regional  wall motion abnormalities. Left ventricular diastolic parameters are  consistent with Grade I diastolic  dysfunction (impaired relaxation).   2. Right ventricular systolic function is normal. The right ventricular  size is normal. There is normal pulmonary artery systolic pressure.   3. The mitral valve is normal in structure. Trivial mitral valve  regurgitation. No evidence of mitral stenosis.   4. The aortic valve is tricuspid. Aortic valve regurgitation is not  visualized. No aortic stenosis is present.   5. The inferior vena cava is normal in size with greater than 50%  respiratory variability, suggesting right atrial pressure of 3 mmHg.    01/29/23: CT chest/abd/pelvis IMPRESSION: 1. Stable thoracic aortic endograft without evidence of aneurysm or dissection. 2. Aortic atherosclerosis with mild aneurysmal dilatation of the proximal abdominal aorta measuring up to 3.6 cm, decreased from 3.8 cm on the previous exam. Recommend follow-up ultrasound every 2 years. This recommendation follows ACR consensus guidelines: White Paper of the ACR Incidental Findings Committee II on Vascular Findings. J Am Coll Radiol 2013; 10:789-794. 3. Cardiomegaly with coronary artery calcifications. 4. Remaining incidental findings as described above.  10/20/2019: Lexiscan stress myoview Nuclear stress EF: 66%. There was no ST segment deviation noted during stress. No T wave inversion was noted during stress. The study is normal. This is a low risk study. No ischemia. The left ventricular ejection fraction is hyperdynamic (>65%).  10/20/2019 TTE IMPRESSIONS  1. Left ventricular ejection fraction, by visual estimation, is 60 to 65%. The left ventricle has normal function. There is no left ventricular hypertrophy.  2. Elevated left ventricular end-diastolic pressure.  3. Left ventricular diastolic parameters are consistent with Grade I diastolic dysfunction (impaired relaxation).  4. The  left ventricle has no regional wall motion abnormalities.  5. Global right ventricle has normal systolic function.The right ventricular size is normal. No increase in right ventricular wall thickness.  6. Left atrial size was  mildly dilated.  7. Right atrial size was normal.  8. The mitral valve is normal in structure. Trivial mitral valve regurgitation. No evidence of mitral stenosis.  9. The tricuspid valve is normal in structure. 10. The aortic valve is normal in structure. Aortic valve regurgitation is not visualized. No evidence of aortic valve sclerosis or stenosis. 11. The pulmonic valve was normal in structure. Pulmonic valve regurgitation is not visualized. 12. Normal pulmonary artery systolic pressure. 13. The tricuspid regurgitant velocity is 2.12 m/s, and with an assumed right atrial pressure of 3 mmHg, the estimated right ventricular systolic pressure is normal at 21.0 mmHg. 14. The inferior vena cava is normal in size with greater than 50% respiratory variability, suggesting right atrial pressure of 3 mmHg.    05/28/2018: TTE Study Conclusions - HPI and indications: 441.01 Dissection of Aorta , Thoracic. - Left ventricle: The cavity size was normal. Systolic function was   normal. The estimated ejection fraction was in the range of 55%   to 60%. Wall motion was normal; there were no regional wall   motion abnormalities. Doppler parameters are consistent with   abnormal left ventricular relaxation (grade 1 diastolic   dysfunction). - Aortic valve: Trileaflet; mildly thickened leaflets. There was no   regurgitation. - Aortic root: There was evidence of aortic dissection flap noted   in the descending thoracic aorta (0.37cm flap posterior). Seen on   CT scan. - Mitral valve: Calcified annulus. - Tricuspid valve: There was mild regurgitation. - Pulmonary arteries: Systolic pressure was mildly increased. PA   peak pressure: 36 mm Hg (S). - Pericardium, extracardiac: A trivial  pericardial effusion was   identified posterior to the heart   2D echo 07/19/2016 Study Conclusions - Left ventricle: Distal septal hypokinesis. The cavity size was   normal. Systolic function was normal. The estimated ejection   fraction was 55%. Wall motion was normal; there were no regional   wall motion abnormalities. Doppler parameters are consistent with   both elevated ventricular end-diastolic filling pressure and   elevated left atrial filling pressure. - Atrial septum: No defect or patent foramen ovale was identified. - Pericardium, extracardiac: A trivial pericardial effusion was   identified posterior to the heart  Recent Labs: 01/29/2023: ALT 16; B Natriuretic Peptide 21.2; BUN 19; Creatinine, Ser 0.96; Hemoglobin 11.0; Platelets 331; Potassium 3.7; Sodium 141  No results found for requested labs within last 365 days.   CrCl cannot be calculated (Patient's most recent lab result is older than the maximum 21 days allowed.).   Wt Readings from Last 3 Encounters:  05/25/23 173 lb 9.6 oz (78.7 kg)  02/14/23 173 lb 3.2 oz (78.6 kg)  01/12/23 172 lb (78 kg)     Other studies reviewed: Additional studies/records reviewed today include: summarized above  ASSESSMENT AND PLAN:  1. Atrial fibrillation     CHA2DS2Vasc is 5 (including gender)     H/o provoked DVT/PE (post hip surgery)     No ongoing symptoms/palpitations   I don't think we have any formal record of her Afib though she relates a clear and known longstanding diagnosis of this OFF a/c 2/2 recurrent hematuria  She has not wanted to consider alternative OAC, or resuming a/c We have previously discussed monitoring options , wearable tech, Kardia, perhaps loop monitor. She would not want to pursue loop   2. HTN     Looks good  3. DOE Not an on goig complaint  4. Dyslipidemia Labs today On crestor  Disposition:  6 mo again, sooner if needed   Current medicines are reviewed at length with the patient  today.  The patient did not have any concerns regarding medicines.  Norma Fredrickson, PA-C 08/14/2023 4:06 PM     [facility] [address] [place] [address] [phone] (office)  [phone] (fax)

## 2023-08-17 ENCOUNTER — Ambulatory Visit: Payer: Medicare Other | Attending: Physician Assistant | Admitting: Physician Assistant

## 2023-08-17 ENCOUNTER — Encounter: Payer: Self-pay | Admitting: Physician Assistant

## 2023-08-17 VITALS — BP 132/70 | HR 70 | Ht 64.0 in | Wt 171.2 lb

## 2023-08-17 DIAGNOSIS — I10 Essential (primary) hypertension: Secondary | ICD-10-CM | POA: Insufficient documentation

## 2023-08-17 DIAGNOSIS — I480 Paroxysmal atrial fibrillation: Secondary | ICD-10-CM | POA: Diagnosis not present

## 2023-08-17 DIAGNOSIS — E785 Hyperlipidemia, unspecified: Secondary | ICD-10-CM | POA: Diagnosis not present

## 2023-08-17 DIAGNOSIS — Z79899 Other long term (current) drug therapy: Secondary | ICD-10-CM | POA: Diagnosis not present

## 2023-08-17 LAB — LIPID PANEL
Chol/HDL Ratio: 3.6 ratio (ref 0.0–4.4)
Cholesterol, Total: 200 mg/dL — ABNORMAL HIGH (ref 100–199)
HDL: 55 mg/dL (ref >39)
LDL Chol Calc (NIH): 128 mg/dL — ABNORMAL HIGH (ref 0–99)
Triglycerides: 92 mg/dL (ref 0–149)
VLDL Cholesterol Cal: 17 mg/dL (ref 5–40)

## 2023-08-17 LAB — HEPATIC FUNCTION PANEL
ALT: 12 [IU]/L (ref 0–32)
AST: 22 [IU]/L (ref 0–40)
Albumin: 4.1 g/dL (ref 3.9–4.9)
Alkaline Phosphatase: 116 [IU]/L (ref 44–121)
Bilirubin Total: 0.6 mg/dL (ref 0.0–1.2)
Bilirubin, Direct: 0.16 mg/dL (ref 0.00–0.40)
Total Protein: 6.7 g/dL (ref 6.0–8.5)

## 2023-08-17 MED ORDER — ROSUVASTATIN CALCIUM 20 MG PO TABS: ORAL_TABLET | ORAL | 3 refills | Status: DC

## 2023-08-17 MED ORDER — DILTIAZEM HCL ER 240 MG PO CP24
240.0000 mg | ORAL_CAPSULE | Freq: Every morning | ORAL | 3 refills | Status: DC
Start: 2023-08-17 — End: 2024-09-02

## 2023-08-17 NOTE — Patient Instructions (Signed)
Medication Instructions:  Your physician recommends that you continue on your current medications as directed. Please refer to the Current Medication list given to you today.  *If you need a refill on your cardiac medications before your next appointment, please call your pharmacy*  Lab Work: Lipids, lft's-TODAY If you have labs (blood work) drawn today and your tests are completely normal, you will receive your results only by: MyChart Message (if you have MyChart) OR A paper copy in the mail If you have any lab test that is abnormal or we need to change your treatment, we will call you to review the results.  Follow-Up: At [facility], you and your health needs are our priority.  As part of our continuing mission to provide you with exceptional heart care, we have created designated Provider Care Teams.  These Care Teams include your primary Cardiologist (physician) and Advanced Practice Providers (APPs -  Physician Assistants and Nurse Practitioners) who all work together to provide you with the care you need, when you need it.  Your next appointment:   6 month(s)  Provider:   Loman Brooklyn, MD

## 2023-09-05 DIAGNOSIS — N816 Rectocele: Secondary | ICD-10-CM | POA: Insufficient documentation

## 2023-09-05 NOTE — Assessment & Plan Note (Signed)
For treatment of pelvic organ prolapse, we discussed options for management including expectant management, conservative management, and surgical management, such as Kegels, a pessary, pelvic floor physical therapy, and specific surgical procedures.

## 2023-09-05 NOTE — Progress Notes (Signed)
[facility] Return Visit  SUBJECTIVE  History of Present Illness: Samantha Stevenson is a 72 y.o. female seen in follow-up.    Feels she has to urinate all the time. Feels bloated like she is not emptying her bladder because she always has to go. Started a few weeks ago. Has an odor to the urine. Denies burning with urination.  Voids more than every hour during the day. Waking 3-4 times at night.  Drinks 40oz water, occasional gingerale. Stopped drinking the tea.   Past Medical History: Patient  has a past medical history of Allergy, Anemia, Arthritis, Atrial fibrillation (HCC), CHF (congestive heart failure) (HCC), Clotting disorder (HCC), Degenerative arthritis of hip, Dyspnea, Dysrhythmia, History of blood transfusion (05/2018), Hyperlipidemia, Hypertension, Intramural hematoma of thoracic aorta (HCC) (06/13/2018), Migraines, Myocardial infarction (HCC) (2000), Neuromuscular disorder (HCC), and Pulmonary emboli (HCC) (12/2012 dx).   Past Surgical History: She  has a past surgical history that includes Vagina reconstruction surgery (1966); Total hip arthroplasty (Left, 01/03/2013); Breast Ultrasound (Left, 04/16/2013); IR THORACENTESIS ASP PLEURAL SPACE W/IMG GUIDE (05/31/2018); Wound exploration (Left, 06/16/2018); Thoracic aortic endovascular stent graft (Left, 06/13/2018); Carotid-subclavian Bypass Graft (Left, 06/13/2018); Tonsillectomy; Abdominal hysterectomy; Tubal ligation; Cardiac catheterization; Artery repair (Left, 08/15/2018); DG TUMB RIGHT HAND (Right); Colonoscopy; Joint replacement; Anterior cervical decompression/discectomy fusion 4 level (N/A, 02/02/2020); Cystoscopy with retrograde pyelogram, ureteroscopy and stent placement (Bilateral, 11/02/2020); Knee arthroscopy (Left, 12/16/2020); and Bunionectomy (Left).   Medications: She has a current medication list which includes the following prescription(s): acetaminophen, aspirin ec, vitamin b complex, clindamycin,  diltiazem, ferrous sulfate, furosemide, lidocaine, linzess, fiber, metronidazole, multivitamin, nitrofurantoin (macrocrystal-monohydrate), polyethylene glycol powder, rosuvastatin, and trospium chloride, and the following Facility-Administered Medications: methylprednisolone acetate.   Allergies: Patient is allergic to penicillins, shellfish allergy, latex, banana, kiwi extract, lactose intolerance (gi), oxycodone, asa [aspirin], celebrex [celecoxib], and nsaids.   Social History: Patient  reports that she has never smoked. She has never used smokeless tobacco. She reports that she does not currently use alcohol. She reports that she does not use drugs.      OBJECTIVE     Physical Exam: Vitals:   09/07/23 0801  BP: 119/72  Pulse: 76   Gen: No apparent distress, A&O x 3.  Detailed Urogynecologic Evaluation:  Deferred.   ASSESSMENT AND PLAN    [name] is a 71 y.o. with:  1. Urinary frequency   2. Overactive bladder   3. Leukocytes in urine   4. Urinary tract infection without hematuria, site unspecified     Urinary frequency Assessment & Plan: - will obtain urine sample to r/o UTI  Orders: -     POCT urinalysis dipstick  Overactive bladder Assessment & Plan: We discussed the symptoms of overactive bladder (OAB), which include urinary urgency, urinary frequency, nocturia, with or without urge incontinence.  While we do not know the exact etiology of OAB, several treatment options exist. We discussed management including behavioral therapy (decreasing bladder irritants, urge suppression strategies, timed voids, bladder retraining), physical therapy, medication.  - She is interested in starting a medication.    Orders: -     Trospium Chloride ER; Take 1 capsule (60 mg total) by mouth daily.  Dispense: 30 capsule; Refill: 5  Leukocytes in urine  Urinary tract infection without hematuria, site unspecified Assessment & Plan: - positive nitrites on UA. Will send for  culture. Treat with nitrofurantoin.   Orders: -     POCT urinalysis dipstick -     Urine Culture; Future -  Nitrofurantoin Monohyd Macro; Take 1 capsule (100 mg total) by mouth 2 (two) times daily for 5 days.  Dispense: 10 capsule; Refill: 0

## 2023-09-07 ENCOUNTER — Other Ambulatory Visit (HOSPITAL_COMMUNITY)
Admission: RE | Admit: 2023-09-07 | Discharge: 2023-09-07 | Disposition: A | Discharge status: Home / Self Care | Payer: Medicare Other | Source: Ambulatory Visit | Attending: Obstetrics and Gynecology | Admitting: Obstetrics and Gynecology

## 2023-09-07 ENCOUNTER — Ambulatory Visit (INDEPENDENT_AMBULATORY_CARE_PROVIDER_SITE_OTHER): Payer: Medicare Other | Admitting: Obstetrics and Gynecology

## 2023-09-07 ENCOUNTER — Encounter: Payer: Self-pay | Admitting: Obstetrics and Gynecology

## 2023-09-07 VITALS — BP 119/72 | HR 76

## 2023-09-07 DIAGNOSIS — R82998 Other abnormal findings in urine: Secondary | ICD-10-CM | POA: Diagnosis not present

## 2023-09-07 DIAGNOSIS — N390 Urinary tract infection, site not specified: Secondary | ICD-10-CM | POA: Diagnosis not present

## 2023-09-07 DIAGNOSIS — N816 Rectocele: Secondary | ICD-10-CM

## 2023-09-07 DIAGNOSIS — N3281 Overactive bladder: Secondary | ICD-10-CM | POA: Diagnosis not present

## 2023-09-07 DIAGNOSIS — R350 Frequency of micturition: Secondary | ICD-10-CM | POA: Insufficient documentation

## 2023-09-07 LAB — POCT URINALYSIS DIPSTICK
Bilirubin, UA: NEGATIVE
Blood, UA: NEGATIVE
Glucose, UA: NEGATIVE
Ketones, UA: NEGATIVE
Leukocytes, UA: NEGATIVE
Nitrite, UA: POSITIVE
Protein, UA: POSITIVE — AB
Spec Grav, UA: 1.015 (ref 1.010–1.025)
Urobilinogen, UA: 0.2 U/dL
pH, UA: 6.5 (ref 5.0–8.0)

## 2023-09-07 MED ORDER — NITROFURANTOIN MONOHYD MACRO 100 MG PO CAPS
100.0000 mg | ORAL_CAPSULE | Freq: Two times a day (BID) | ORAL | 0 refills | Status: AC
Start: 2023-09-07 — End: 2023-09-12

## 2023-09-07 MED ORDER — TROSPIUM CHLORIDE ER 60 MG PO CP24
1.0000 | ORAL_CAPSULE | Freq: Every day | ORAL | 5 refills | Status: DC
Start: 2023-09-07 — End: 2023-10-19

## 2023-09-07 NOTE — Assessment & Plan Note (Signed)
We discussed the symptoms of overactive bladder (OAB), which include urinary urgency, urinary frequency, nocturia, with or without urge incontinence.  While we do not know the exact etiology of OAB, several treatment options exist. We discussed management including behavioral therapy (decreasing bladder irritants, urge suppression strategies, timed voids, bladder retraining), physical therapy, medication.  - She is interested in starting a medication.

## 2023-09-07 NOTE — Assessment & Plan Note (Signed)
-   positive nitrites on UA. Will send for culture. Treat with nitrofurantoin.

## 2023-09-07 NOTE — Assessment & Plan Note (Signed)
-   will obtain urine sample to r/o UTI

## 2023-09-09 LAB — URINE CULTURE: Culture: >=100000 — AB

## 2023-09-13 ENCOUNTER — Telehealth: Payer: Self-pay

## 2023-09-13 NOTE — Telephone Encounter (Signed)
Patient's Trospium medication has been denied.

## 2023-09-14 DIAGNOSIS — Z1231 Encounter for screening mammogram for malignant neoplasm of breast: Secondary | ICD-10-CM | POA: Diagnosis not present

## 2023-09-14 LAB — HM MAMMOGRAPHY

## 2023-09-17 ENCOUNTER — Encounter: Payer: Self-pay | Admitting: Family Medicine

## 2023-10-19 ENCOUNTER — Ambulatory Visit (INDEPENDENT_AMBULATORY_CARE_PROVIDER_SITE_OTHER): Payer: Medicare Other | Admitting: Obstetrics and Gynecology

## 2023-10-19 ENCOUNTER — Encounter: Payer: Self-pay | Admitting: Obstetrics and Gynecology

## 2023-10-19 VITALS — BP 159/82 | HR 76

## 2023-10-19 DIAGNOSIS — N952 Postmenopausal atrophic vaginitis: Secondary | ICD-10-CM

## 2023-10-19 DIAGNOSIS — N3281 Overactive bladder: Secondary | ICD-10-CM | POA: Diagnosis not present

## 2023-10-19 DIAGNOSIS — R350 Frequency of micturition: Secondary | ICD-10-CM

## 2023-10-19 LAB — POCT URINALYSIS DIPSTICK
Bilirubin, UA: NEGATIVE
Glucose, UA: NEGATIVE
Ketones, UA: NEGATIVE
Leukocytes, UA: NEGATIVE
Nitrite, UA: NEGATIVE
Protein, UA: POSITIVE — AB
Spec Grav, UA: 1.025 (ref 1.010–1.025)
Urobilinogen, UA: 0.2 U/dL
pH, UA: 6 (ref 5.0–8.0)

## 2023-10-19 MED ORDER — VIBEGRON 75 MG PO TABS
75.0000 mg | ORAL_TABLET | Freq: Every day | ORAL | 5 refills | Status: DC
Start: 2023-10-19 — End: 2023-11-29

## 2023-10-19 MED ORDER — ESTRADIOL 0.1 MG/GM VA CREA
0.5000 | TOPICAL_CREAM | VAGINAL | 12 refills | Status: DC
Start: 2023-10-22 — End: 2023-10-19

## 2023-10-19 MED ORDER — ESTRADIOL 0.1 MG/GM VA CREA
0.5000 | TOPICAL_CREAM | VAGINAL | 12 refills | Status: DC
Start: 2023-10-22 — End: 2024-01-04

## 2023-10-19 NOTE — Patient Instructions (Addendum)
Use the vaginal estrogen twice a week inside the vagina at night time to help with tissue quality.   Start Gemtesa daily with breakfast.   I will follow up with you on the medication.

## 2023-10-19 NOTE — Progress Notes (Signed)
[facility] Return Visit  SUBJECTIVE  History of Present Illness: Samantha Stevenson is a 72 y.o. female seen in follow-up for OAB and urinary frequency. Plan at last visit was start Trospium 60mg  ER daily. Patient reports her PA was not approved for the medication and she could not get the medication.   Patient reports she was sexually active in September for the first time in many years and it was dry.    Patient also endorses her urine has an ammonia smell that is concerning for another UTI.    Past Medical History: Patient  has a past medical history of Allergy, Anemia, Arthritis, Atrial fibrillation (HCC), CHF (congestive heart failure) (HCC), Clotting disorder (HCC), Degenerative arthritis of hip, Dyspnea, Dysrhythmia, History of blood transfusion (05/2018), Hyperlipidemia, Hypertension, Intramural hematoma of thoracic aorta (HCC) (06/13/2018), Migraines, Myocardial infarction (HCC) (2000), Neuromuscular disorder (HCC), and Pulmonary emboli (HCC) (12/2012 dx).   Past Surgical History: She  has a past surgical history that includes Vagina reconstruction surgery (1966); Total hip arthroplasty (Left, 01/03/2013); Breast Ultrasound (Left, 04/16/2013); IR THORACENTESIS ASP PLEURAL SPACE W/IMG GUIDE (05/31/2018); Wound exploration (Left, 06/16/2018); Thoracic aortic endovascular stent graft (Left, 06/13/2018); Carotid-subclavian Bypass Graft (Left, 06/13/2018); Tonsillectomy; Abdominal hysterectomy; Tubal ligation; Cardiac catheterization; Artery repair (Left, 08/15/2018); DG TUMB RIGHT HAND (Right); Colonoscopy; Joint replacement; Anterior cervical decompression/discectomy fusion 4 level (N/A, 02/02/2020); Cystoscopy with retrograde pyelogram, ureteroscopy and stent placement (Bilateral, 11/02/2020); Knee arthroscopy (Left, 12/16/2020); and Bunionectomy (Left).   Medications: She has a current medication list which includes the following prescription(s): vibegron, acetaminophen,  aspirin ec, vitamin b complex, clindamycin, diltiazem, [START ON 10/22/2023] estradiol, ferrous sulfate, furosemide, linzess, metronidazole, multivitamin, polyethylene glycol powder, and rosuvastatin, and the following Facility-Administered Medications: methylprednisolone acetate.   Allergies: Patient is allergic to penicillins, shellfish allergy, latex, banana, kiwi extract, lactose intolerance (gi), oxycodone, asa [aspirin], celebrex [celecoxib], and nsaids.   Social History: Patient  reports that she has never smoked. She has never used smokeless tobacco. She reports that she does not currently use alcohol. She reports that she does not use drugs.     OBJECTIVE    Lab Results  Component Value Date   COLORU Yellow 10/19/2023   CLARITYU Clear 10/19/2023   GLUCOSEUR Negative 10/19/2023   BILIRUBINUR Negative 10/19/2023   KETONESU Negative 10/19/2023   SPECGRAV 1.025 10/19/2023   RBCUR Trace-Intact 10/19/2023   PHUR 6.0 10/19/2023   PROTEINUR Positive (A) 10/19/2023   UROBILINOGEN 0.2 10/19/2023   LEUKOCYTESUR Negative 10/19/2023     Physical Exam: Vitals:   10/19/23 0827 10/19/23 0830  BP: (!) 152/78 (!) 159/82  Pulse: 73 76   Gen: No apparent distress, A&O x 3. Right sided flank tenderness. No CVA tenderness noted on the left side but patient has reported pain/tightness in the left lower abdominal region.  Detailed Urogynecologic Evaluation:  Deferred.    ASSESSMENT AND PLAN    Samantha Stevenson is a 72 y.o. with:  1. Urinary frequency   2. Vaginal atrophy    Urine not concerning today for infection. We discussed drinking more water to flush her system and not doing sparkling water as frequently. We discussed also using fresh fruit or cucumber in the water to give it flavor while avoiding the citrus fruits. As she was not able to start Trospium for OAB, will start patient on Gemtesa 75mg  daily for bladder control. Patient would not be a good candidate for other  anticholinergics due to her age and risk of side effects and is not a  good candidate for Myrbetriq due to her blood pressure.   Patient has reported symptoms  of vaginal atrophy with intercourse. We discussed that she can use the estrogen cream x2 weekly to assist in prevention of UTI and for vaginal tissue support for intercourse. We also discussed if she is having intercourse that a silicon based lubricant is more comfortable after menopause. Samples of Uberlube given today as an example of a lubrication with a silicon base.   Patient to follow up in 6 weeks or sooner if needed.  Selmer Dominion, NP

## 2023-11-01 ENCOUNTER — Other Ambulatory Visit: Payer: Self-pay | Admitting: Obstetrics and Gynecology

## 2023-11-01 DIAGNOSIS — R350 Frequency of micturition: Secondary | ICD-10-CM

## 2023-11-01 MED ORDER — PHENAZOPYRIDINE HCL 95 MG PO TABS
95.0000 mg | ORAL_TABLET | Freq: Three times a day (TID) | ORAL | 1 refills | Status: DC | PRN
Start: 2023-11-01 — End: 2023-11-15

## 2023-11-01 NOTE — Telephone Encounter (Signed)
 Called and spoke to patient who reports she is doing well overall:  She states that her Gemtesa  has been working well for her and she is not going to the bathroom as urgently or frequently.   She reports her blood pressures have been well managed.   Patient reports she does have concern about the smell to her urine still. She states it still smells like ammonia. When she last came it was clear of signs of infection. Encouraged her to do some Pyridium  for a few days and drink plenty of water  to see if this helps her urine odor. She is open to this plan of care and reports she will try it. Encouraged her to call Monday and let me know if this was helpful.

## 2023-11-15 ENCOUNTER — Ambulatory Visit: Payer: Medicare Other | Admitting: Family Medicine

## 2023-11-15 ENCOUNTER — Encounter: Payer: Self-pay | Admitting: Family Medicine

## 2023-11-15 VITALS — BP 132/76 | HR 94 | Temp 98.7°F | Ht 64.0 in | Wt 174.0 lb

## 2023-11-15 DIAGNOSIS — R8290 Unspecified abnormal findings in urine: Secondary | ICD-10-CM

## 2023-11-15 DIAGNOSIS — N3281 Overactive bladder: Secondary | ICD-10-CM | POA: Diagnosis not present

## 2023-11-15 DIAGNOSIS — N3001 Acute cystitis with hematuria: Secondary | ICD-10-CM | POA: Diagnosis not present

## 2023-11-15 LAB — POC URINALSYSI DIPSTICK (AUTOMATED)
Bilirubin, UA: NEGATIVE
Blood, UA: POSITIVE
Glucose, UA: NEGATIVE
Ketones, UA: NEGATIVE
Leukocytes, UA: NEGATIVE
Nitrite, UA: POSITIVE
Protein, UA: POSITIVE — AB
Spec Grav, UA: 1.015 (ref 1.010–1.025)
Urobilinogen, UA: 0.2 U/dL
pH, UA: 6 (ref 5.0–8.0)

## 2023-11-15 MED ORDER — SULFAMETHOXAZOLE-TRIMETHOPRIM 800-160 MG PO TABS: 1.0000 | ORAL_TABLET | Freq: Two times a day (BID) | ORAL | 0 refills | Status: AC

## 2023-11-15 NOTE — Progress Notes (Signed)
Established Patient Office Visit   Subjective  Patient ID: Samantha Stevenson, female    DOB: 05/04/1951  Age: 73 y.o. MRN: [id number]  Chief Complaint  Patient presents with   Urinary Tract Infection    Pt is a 73 yo female with h/o OAB, chronic constipation, diastolic CHF,  HTN, afib, vaginal wall prolapse who is seen for ongoing concern.  Pt endorses malodorous urine x 2 months.  States an ammonia like smell started after Thanksgiving.  Denies dysuria, n/v, increased urinary frequency or constipation.  Given pyridium on 11/01/23 by Urogyn, however pt did not pick up rx as pharmacy told her it was for dysuria not odor.    Patient taking Linzess for chronic constipation.  Wont take if has an appt.  Having daily to BID BMs on med.  Taking vaginal estradiol and vibegron 75 mg for OAB.  Pt states she is drinking mostly water and some cranberry juice each day.      Patient Active Problem List   Diagnosis Date Noted   Urinary frequency 09/07/2023   Overactive bladder 09/07/2023   Urinary tract infection without hematuria 09/07/2023   Prolapse of posterior vaginal wall 09/05/2023   Bilateral arm pain 04/19/2022   Other fatigue 04/19/2022   Chronic diastolic CHF (congestive heart failure) (HCC) 11/24/2020   Acute lateral meniscal tear, left, subsequent encounter 11/16/2020   Cervical spondylosis with myelopathy and radiculopathy 02/02/2020   Chronic neck pain 07/17/2019   LLQ pain 05/16/2019   Chronic bilateral low back pain without sciatica 10/07/2018   Pain and swelling of left lower leg 08/08/2018   Hematuria, gross 07/29/2018   Thoracic aortic dissection (HCC) 06/13/2018   Intramural aortic hematoma (HCC) 05/28/2018   Bilateral leg edema 02/27/2018   Routine general medical examination at a health care facility 06/24/2015   Constipation 03/17/2015   Myalgia and myositis 03/17/2015   Dyslipidemia    Atrial fibrillation (HCC)    Hypertension    History of pulmonary embolism  01/10/2013   Anemia 01/10/2013   Arthritis 01/03/2013   Past Medical History:  Diagnosis Date   Allergy    Anemia    Arthritis    "qwhere" (07/29/2018)   Atrial fibrillation (HCC)    CHF (congestive heart failure) (HCC)    Clotting disorder (HCC)    Degenerative arthritis of hip    s/p L THR 12/2012   Dyspnea    since surgery in August 2019- "when I get worked up and walk a short distance"   Dysrhythmia    History of blood transfusion 05/2018   "related to OR"   Hyperlipidemia    Hypertension    Intramural hematoma of thoracic aorta (HCC) 06/13/2018   Migraines    mIgraines- none since blood pressure and lipids are under control   Myocardial infarction (HCC) 2000   due to atrial fib   Neuromuscular disorder (HCC)    pinched nerve- left side of neck   Pulmonary emboli (HCC) 12/2012 dx   post op (L THR), anticoag x 37mo   Past Surgical History:  Procedure Laterality Date   ABDOMINAL HYSTERECTOMY     "w/1 tube"   ANTERIOR CERVICAL DECOMPRESSION/DISCECTOMY FUSION 4 LEVELS N/A 02/02/2020   Procedure: CERVICAL THREE-FOUR, CERVICAL FOUR-FIVE, CERVICAL FIVE-SIX, CERVICAL SIX-SEVEN ANTERIOR CERVICAL DECOMPRESSION/DISCECTOMY FUSION;  Surgeon: Julio Sicks, MD;  Location: [facility] OR;  Service: Neurosurgery;  Laterality: N/A;   ARTERY REPAIR Left 08/15/2018   Procedure: LEFT BRACHIAL ARTERY EXPLORATION;  Surgeon: Maeola Harman, MD;  Location:  [facility] OR;  Service: Vascular;  Laterality: Left;   Breast Ultrasound Left 04/16/2013   Done @ breast center Impression: no malignancy appearance noted on the screen study is consistent with a summation shadow   BUNIONECTOMY Left    CARDIAC CATHETERIZATION     CAROTID-SUBCLAVIAN BYPASS GRAFT Left 06/13/2018   Procedure: BYPASS GRAFT CAROTID-SUBCLAVIAN;  Surgeon: Maeola Harman, MD;  Location: [facility] OR;  Service: Vascular;  Laterality: Left;   COLONOSCOPY     CYSTOSCOPY WITH RETROGRADE PYELOGRAM, URETEROSCOPY AND STENT PLACEMENT Bilateral  11/02/2020   Procedure: DIAGNOSTIC CYSTOSCOPY WITH RETROGRADE PYELOGRAM,BILATERAL DIAGNOSTIC URETEROSCOPY  AND BILATERAL STENT PLACEMENT;  Surgeon: Noel Christmas, MD;  Location: [facility];  Service: Urology;  Laterality: Bilateral;  90 MINS   DG TUMB RIGHT HAND Right    cyst removal   IR THORACENTESIS ASP PLEURAL SPACE W/IMG GUIDE  05/31/2018   JOINT REPLACEMENT     KNEE ARTHROSCOPY Left 12/16/2020   Procedure: LEFT KNEE ARTHROSCOPY WITH PARTIAL LATERAL MENISCECTOMY;  Surgeon: Kathryne Hitch, MD;  Location: [facility];  Service: Orthopedics;  Laterality: Left;   THORACIC AORTIC ENDOVASCULAR STENT GRAFT Left 06/13/2018   Procedure: LEFT  SUBCLAVIAN TO CAROTID ARTERY TRANSPOSITION; THORACIC AORTIC ENDOVASCULAR STENT GRAFT using GORE CONFORMABLE THORACIC STENT GRAFT AND GORE TAG THORACIC ENDOPROSTHESIS; STENT LEFT COMMON CAROTID ARTERY, RIGHT COMMON-FEMORAL ENDARTERECTOMY WITH PATCH ANGIOPLASTY;  Surgeon: Maeola Harman, MD;  Location: [facility] OR;  Service: Vascular;  Laterality: Left;   TONSILLECTOMY     TOTAL HIP ARTHROPLASTY Left 01/03/2013   Procedure: TOTAL HIP ARTHROPLASTY ANTERIOR APPROACH;  Surgeon: Kathryne Hitch, MD;  Location: [facility];  Service: Orthopedics;  Laterality: Left;  Left Total Hip Arthroplasty, Anterior Approach   TUBAL LIGATION     VAGINA RECONSTRUCTION SURGERY  1966   vagina had closed up when 73 years old   WOUND EXPLORATION Left 06/16/2018   Procedure: LEFT NECK WOUND EXPLORATION, CHEST TUBE INSERTION;  Surgeon: Maeola Harman, MD;  Location: [facility] OR;  Service: Vascular;  Laterality: Left;   Social History   Tobacco Use   Smoking status: Never   Smokeless tobacco: Never  Vaping Use   Vaping status: Never Used  Substance Use Topics   Alcohol use: Not Currently   Drug use: Never   Family History  Problem Relation Age of Onset   Heart disease Father    Diabetes Mother    Heart disease Mother         pacemaker   Heart attack Mother 38   Diabetes Sister    Alcohol abuse Other    Heart disease Other    Hyperlipidemia Other    Hypertension Other    Diabetes Other    Breast cancer Other    Colon cancer Neg Hx    Esophageal cancer Neg Hx    Stomach cancer Neg Hx    Rectal cancer Neg Hx    Allergies  Allergen Reactions   Penicillins Swelling and Other (See Comments)    Severe swelling PATIENT HAS HAD A PCN REACTION WITH IMMEDIATE RASH, FACIAL/TONGUE/THROAT SWELLING, SOB, OR LIGHTHEADEDNESS WITH HYPOTENSION:  #  #  YES  #  #  Has patient had a PCN reaction causing severe rash involving mucus membranes or skin necrosis: No PATIENT HAS HAD A PCN REACTION THAT REQUIRED HOSPITALIZATION:  #  #  YES  #  #  Has patient had a PCN reaction occurring within the last 10 years: No   Shellfish Allergy  Anaphylaxis   Latex Hives and Rash   Banana Nausea Only   Kiwi Extract    Lactose Intolerance (Gi) Diarrhea and Nausea And Vomiting   Oxycodone Other (See Comments)    Pt stated, "Made me feel really drowsy" Tolerating as of 02/16/20 after neck surgery   Asa [Aspirin] Nausea And Vomiting    Makes stomach upset Asprin 325   Celebrex [Celecoxib] Nausea And Vomiting    GI upset   Nsaids Rash      ROS Negative unless stated above    Objective:     BP 132/76 (BP Location: Left Arm, Patient Position: Sitting, Cuff Size: Large)   Pulse 94   Temp 98.7 F (37.1 C) (Oral)   Ht 5\' 4"  (1.626 m)   Wt 174 lb (78.9 kg)   BMI 29.87 kg/m  BP Readings from Last 3 Encounters:  11/15/23 132/76  10/19/23 (!) 159/82  09/07/23 119/72   Wt Readings from Last 3 Encounters:  11/15/23 174 lb (78.9 kg)  08/17/23 171 lb 3.2 oz (77.7 kg)  05/25/23 173 lb 9.6 oz (78.7 kg)      Physical Exam Constitutional:      General: She is not in acute distress.    Appearance: Normal appearance.  HENT:     Head: Normocephalic and atraumatic.     Nose: Nose normal.     Mouth/Throat:     Mouth: Mucous  membranes are moist.  Cardiovascular:     Rate and Rhythm: Normal rate and regular rhythm.     Heart sounds: Normal heart sounds. No murmur heard.    No gallop.  Pulmonary:     Effort: Pulmonary effort is normal. No respiratory distress.     Breath sounds: Normal breath sounds. No wheezing, rhonchi or rales.  Abdominal:     General: Bowel sounds are normal.     Palpations: Abdomen is soft.     Tenderness: There is no abdominal tenderness. There is no right CVA tenderness, left CVA tenderness or guarding.  Skin:    General: Skin is warm and dry.  Neurological:     Mental Status: She is alert and oriented to person, place, and time.     Results for orders placed or performed in visit on 11/15/23  POCT Urinalysis Dipstick (Automated)  Result Value Ref Range   Color, UA yellow    Clarity, UA cloudy    Glucose, UA Negative Negative   Bilirubin, UA neg    Ketones, UA neg    Spec Grav, UA 1.015 1.010 - 1.025   Blood, UA pos    pH, UA 6.0 5.0 - 8.0   Protein, UA Positive (A) Negative   Urobilinogen, UA 0.2 0.2 or 1.0 E.U./dL   Nitrite, UA pos    Leukocytes, UA Negative Negative      Assessment & Plan:  Acute cystitis with hematuria -     Sulfamethoxazole-Trimethoprim; Take 1 tablet by mouth 2 (two) times daily for 7 days.  Dispense: 14 tablet; Refill: 0  Malodorous urine -     POCT Urinalysis Dipstick (Automated) -     Urine Culture  OAB (overactive bladder)  Ongoing Urinary symptoms x several wks.  Concern for partially treated UTI.  POC UA with Nirites, RBCs, , no Leuks.  Start bactrim.  Obtain Ucx.  Given h/o dysuria in the past and frequent urinary symptoms consider interstitial cystitis.  Continue follow-up with urogyn.  Return if symptoms worsen or fail to improve.   Bettey Mare  Salomon Fick, MD

## 2023-11-17 LAB — URINE CULTURE
MICRO NUMBER:: 15989319
SPECIMEN QUALITY:: ADEQUATE

## 2023-11-23 ENCOUNTER — Encounter: Payer: Self-pay | Admitting: Family Medicine

## 2023-11-29 ENCOUNTER — Telehealth: Payer: Self-pay

## 2023-11-29 ENCOUNTER — Other Ambulatory Visit: Payer: Self-pay | Admitting: Obstetrics and Gynecology

## 2023-11-29 DIAGNOSIS — N3281 Overactive bladder: Secondary | ICD-10-CM

## 2023-11-29 DIAGNOSIS — R350 Frequency of micturition: Secondary | ICD-10-CM

## 2023-11-29 MED ORDER — MIRABEGRON ER 25 MG PO TB24: 25.0000 mg | ORAL_TABLET | Freq: Every day | ORAL | 1 refills | Status: DC

## 2023-11-29 NOTE — Progress Notes (Signed)
 Will change patient's Gemtesa  75mg  to Myrbetriq  25 daily. Will have patient come in for medication follow up in 6 weeks to see how medication is working and evaluate BP.

## 2023-11-29 NOTE — Telephone Encounter (Signed)
 I have changed it to Myrbetriq  25mg  daily, it was estimated to be 4.90$. Can we have patient come in 4-6 weeks for a check on the new medication and to check her blood pressure on this medication?

## 2023-11-29 NOTE — Telephone Encounter (Signed)
 Samantha Stevenson is a 73 y.o. female called in for an alternative for Gemtesa . Pt said her insurance will no longer pay for Gemtesa  in 2025

## 2023-12-06 DIAGNOSIS — M5412 Radiculopathy, cervical region: Secondary | ICD-10-CM | POA: Diagnosis not present

## 2023-12-06 DIAGNOSIS — Z6829 Body mass index (BMI) 29.0-29.9, adult: Secondary | ICD-10-CM | POA: Diagnosis not present

## 2023-12-18 DIAGNOSIS — L218 Other seborrheic dermatitis: Secondary | ICD-10-CM | POA: Diagnosis not present

## 2023-12-18 DIAGNOSIS — L718 Other rosacea: Secondary | ICD-10-CM | POA: Diagnosis not present

## 2023-12-24 DIAGNOSIS — N200 Calculus of kidney: Secondary | ICD-10-CM | POA: Diagnosis not present

## 2023-12-24 DIAGNOSIS — E559 Vitamin D deficiency, unspecified: Secondary | ICD-10-CM | POA: Diagnosis not present

## 2023-12-24 DIAGNOSIS — I129 Hypertensive chronic kidney disease with stage 1 through stage 4 chronic kidney disease, or unspecified chronic kidney disease: Secondary | ICD-10-CM | POA: Diagnosis not present

## 2023-12-25 LAB — LAB REPORT - SCANNED
Calcium: 9.6
Creatinine, POC: 135.9 mg/dL
EGFR: 82
PTH: 31

## 2023-12-31 DIAGNOSIS — N200 Calculus of kidney: Secondary | ICD-10-CM | POA: Diagnosis not present

## 2023-12-31 DIAGNOSIS — R809 Proteinuria, unspecified: Secondary | ICD-10-CM | POA: Diagnosis not present

## 2023-12-31 DIAGNOSIS — I129 Hypertensive chronic kidney disease with stage 1 through stage 4 chronic kidney disease, or unspecified chronic kidney disease: Secondary | ICD-10-CM | POA: Diagnosis not present

## 2023-12-31 DIAGNOSIS — R7303 Prediabetes: Secondary | ICD-10-CM | POA: Diagnosis not present

## 2023-12-31 DIAGNOSIS — N182 Chronic kidney disease, stage 2 (mild): Secondary | ICD-10-CM | POA: Diagnosis not present

## 2023-12-31 DIAGNOSIS — R82998 Other abnormal findings in urine: Secondary | ICD-10-CM | POA: Diagnosis not present

## 2023-12-31 DIAGNOSIS — I5032 Chronic diastolic (congestive) heart failure: Secondary | ICD-10-CM | POA: Diagnosis not present

## 2024-01-01 DIAGNOSIS — M5412 Radiculopathy, cervical region: Secondary | ICD-10-CM | POA: Diagnosis not present

## 2024-01-01 DIAGNOSIS — M4803 Spinal stenosis, cervicothoracic region: Secondary | ICD-10-CM | POA: Diagnosis not present

## 2024-01-01 DIAGNOSIS — M5021 Other cervical disc displacement,  high cervical region: Secondary | ICD-10-CM | POA: Diagnosis not present

## 2024-01-01 DIAGNOSIS — M4802 Spinal stenosis, cervical region: Secondary | ICD-10-CM | POA: Diagnosis not present

## 2024-01-01 DIAGNOSIS — Z981 Arthrodesis status: Secondary | ICD-10-CM | POA: Diagnosis not present

## 2024-01-03 ENCOUNTER — Ambulatory Visit: Admitting: Family Medicine

## 2024-01-03 ENCOUNTER — Encounter: Payer: Self-pay | Admitting: Family Medicine

## 2024-01-03 VITALS — BP 134/82 | HR 76 | Temp 98.0°F | Ht 64.0 in | Wt 172.8 lb

## 2024-01-03 DIAGNOSIS — Z87442 Personal history of urinary calculi: Secondary | ICD-10-CM | POA: Diagnosis not present

## 2024-01-03 DIAGNOSIS — R7303 Prediabetes: Secondary | ICD-10-CM

## 2024-01-03 DIAGNOSIS — I10 Essential (primary) hypertension: Secondary | ICD-10-CM

## 2024-01-03 DIAGNOSIS — R808 Other proteinuria: Secondary | ICD-10-CM

## 2024-01-03 DIAGNOSIS — I5032 Chronic diastolic (congestive) heart failure: Secondary | ICD-10-CM | POA: Diagnosis not present

## 2024-01-03 DIAGNOSIS — N182 Chronic kidney disease, stage 2 (mild): Secondary | ICD-10-CM

## 2024-01-03 LAB — POCT GLYCOSYLATED HEMOGLOBIN (HGB A1C): Hemoglobin A1C: 5.7 % — AB (ref 4.0–5.6)

## 2024-01-03 NOTE — Progress Notes (Signed)
 Established Patient Office Visit   Subjective  Patient ID: Samantha Stevenson, female    DOB: May 11, 1951  Age: 73 y.o. MRN: [id number]  Chief Complaint  Patient presents with   Diabetes    Pt is a 73 yo female seen for second opinion.   Pt had recent labs and appt with Nephro 12/31/23.  Pt states her nephrologist wanted to start [place] but pt is hesitant.  Pt thinks med will cause her to have DM and she is already on enough medications.    Patient Active Problem List   Diagnosis Date Noted   Urinary frequency 09/07/2023   Overactive bladder 09/07/2023   Urinary tract infection without hematuria 09/07/2023   Prolapse of posterior vaginal wall 09/05/2023   Bilateral arm pain 04/19/2022   Other fatigue 04/19/2022   Chronic diastolic CHF (congestive heart failure) (HCC) 11/24/2020   Acute lateral meniscal tear, left, subsequent encounter 11/16/2020   Cervical spondylosis with myelopathy and radiculopathy 02/02/2020   Chronic neck pain 07/17/2019   LLQ pain 05/16/2019   Chronic bilateral low back pain without sciatica 10/07/2018   Pain and swelling of left lower leg 08/08/2018   Hematuria, gross 07/29/2018   Thoracic aortic dissection (HCC) 06/13/2018   Intramural aortic hematoma (HCC) 05/28/2018   Bilateral leg edema 02/27/2018   Routine general medical examination at a health care facility 06/24/2015   Constipation 03/17/2015   Myalgia and myositis 03/17/2015   Dyslipidemia    Atrial fibrillation (HCC)    Hypertension    History of pulmonary embolism 01/10/2013   Anemia 01/10/2013   Arthritis 01/03/2013   Past Medical History:  Diagnosis Date   Allergy    Anemia    Arthritis    "qwhere" (07/29/2018)   Atrial fibrillation (HCC)    CHF (congestive heart failure) (HCC)    Clotting disorder (HCC)    Degenerative arthritis of hip    s/p L THR 12/2012   Dyspnea    since surgery in August 2019- "when I get worked up and walk a short distance"   Dysrhythmia    History of  blood transfusion 05/2018   "related to OR"   Hyperlipidemia    Hypertension    Intramural hematoma of thoracic aorta (HCC) 06/13/2018   Migraines    mIgraines- none since blood pressure and lipids are under control   Myocardial infarction (HCC) 2000   due to atrial fib   Neuromuscular disorder (HCC)    pinched nerve- left side of neck   Pulmonary emboli (HCC) 12/2012 dx   post op (L THR), anticoag x 54mo   Past Surgical History:  Procedure Laterality Date   ABDOMINAL HYSTERECTOMY     "w/1 tube"   ANTERIOR CERVICAL DECOMPRESSION/DISCECTOMY FUSION 4 LEVELS N/A 02/02/2020   Procedure: CERVICAL THREE-FOUR, CERVICAL FOUR-FIVE, CERVICAL FIVE-SIX, CERVICAL SIX-SEVEN ANTERIOR CERVICAL DECOMPRESSION/DISCECTOMY FUSION;  Surgeon: Julio Sicks, MD;  Location: [facility] OR;  Service: Neurosurgery;  Laterality: N/A;   ARTERY REPAIR Left 08/15/2018   Procedure: LEFT BRACHIAL ARTERY EXPLORATION;  Surgeon: Maeola Harman, MD;  Location: [facility] OR;  Service: Vascular;  Laterality: Left;   Breast Ultrasound Left 04/16/2013   Done @ breast center Impression: no malignancy appearance noted on the screen study is consistent with a summation shadow   BUNIONECTOMY Left    CARDIAC CATHETERIZATION     CAROTID-SUBCLAVIAN BYPASS GRAFT Left 06/13/2018   Procedure: BYPASS GRAFT CAROTID-SUBCLAVIAN;  Surgeon: Maeola Harman, MD;  Location: [facility] OR;  Service: Vascular;  Laterality: Left;  COLONOSCOPY     CYSTOSCOPY WITH RETROGRADE PYELOGRAM, URETEROSCOPY AND STENT PLACEMENT Bilateral 11/02/2020   Procedure: DIAGNOSTIC CYSTOSCOPY WITH RETROGRADE PYELOGRAM,BILATERAL DIAGNOSTIC URETEROSCOPY  AND BILATERAL STENT PLACEMENT;  Surgeon: Noel Christmas, MD;  Location: [facility];  Service: Urology;  Laterality: Bilateral;  90 MINS   DG TUMB RIGHT HAND Right    cyst removal   IR THORACENTESIS ASP PLEURAL SPACE W/IMG GUIDE  05/31/2018   JOINT REPLACEMENT     KNEE ARTHROSCOPY Left 12/16/2020    Procedure: LEFT KNEE ARTHROSCOPY WITH PARTIAL LATERAL MENISCECTOMY;  Surgeon: Kathryne Hitch, MD;  Location: [facility];  Service: Orthopedics;  Laterality: Left;   THORACIC AORTIC ENDOVASCULAR STENT GRAFT Left 06/13/2018   Procedure: LEFT  SUBCLAVIAN TO CAROTID ARTERY TRANSPOSITION; THORACIC AORTIC ENDOVASCULAR STENT GRAFT using GORE CONFORMABLE THORACIC STENT GRAFT AND GORE TAG THORACIC ENDOPROSTHESIS; STENT LEFT COMMON CAROTID ARTERY, RIGHT COMMON-FEMORAL ENDARTERECTOMY WITH PATCH ANGIOPLASTY;  Surgeon: Maeola Harman, MD;  Location: [facility] OR;  Service: Vascular;  Laterality: Left;   TONSILLECTOMY     TOTAL HIP ARTHROPLASTY Left 01/03/2013   Procedure: TOTAL HIP ARTHROPLASTY ANTERIOR APPROACH;  Surgeon: Kathryne Hitch, MD;  Location: [facility];  Service: Orthopedics;  Laterality: Left;  Left Total Hip Arthroplasty, Anterior Approach   TUBAL LIGATION     VAGINA RECONSTRUCTION SURGERY  1966   vagina had closed up when 73 years old   WOUND EXPLORATION Left 06/16/2018   Procedure: LEFT NECK WOUND EXPLORATION, CHEST TUBE INSERTION;  Surgeon: Maeola Harman, MD;  Location[facility] OR;  Service: Vascular;  Laterality: Left;   Social History   Tobacco Use   Smoking status: Never   Smokeless tobacco: Never  Vaping Use   Vaping status: Never Used  Substance Use Topics   Alcohol use: Not Currently   Drug use: Never   Family History  Problem Relation Age of Onset   Heart disease Father    Diabetes Mother    Heart disease Mother        pacemaker   Heart attack Mother 95   Diabetes Sister    Alcohol abuse Other    Heart disease Other    Hyperlipidemia Other    Hypertension Other    Diabetes Other    Breast cancer Other    Colon cancer Neg Hx    Esophageal cancer Neg Hx    Stomach cancer Neg Hx    Rectal cancer Neg Hx    Allergies  Allergen Reactions   Penicillins Swelling and Other (See Comments)    Severe swelling PATIENT HAS HAD A PCN  REACTION WITH IMMEDIATE RASH, FACIAL/TONGUE/THROAT SWELLING, SOB, OR LIGHTHEADEDNESS WITH HYPOTENSION:  #  #  YES  #  #  Has patient had a PCN reaction causing severe rash involving mucus membranes or skin necrosis: No PATIENT HAS HAD A PCN REACTION THAT REQUIRED HOSPITALIZATION:  #  #  YES  #  #  Has patient had a PCN reaction occurring within the last 10 years: No   Shellfish Allergy Anaphylaxis   Latex Hives and Rash   Banana Nausea Only   Kiwi Extract    Lactose Intolerance (Gi) Diarrhea and Nausea And Vomiting   Oxycodone Other (See Comments)    Pt stated, "Made me feel really drowsy" Tolerating as of 02/16/20 after neck surgery   Asa [Aspirin] Nausea And Vomiting    Makes stomach upset Asprin 325   Celebrex [Celecoxib] Nausea And Vomiting    GI upset  Nsaids Rash    ROS Negative unless stated above    Objective:     BP 134/82 (BP Location: Left Arm, Patient Position: Sitting, Cuff Size: Large)   Pulse 76   Temp 98 F (36.7 C) (Oral)   Ht 5\' 4"  (1.626 m)   Wt 172 lb 12.8 oz (78.4 kg)   SpO2 98%   BMI 29.66 kg/m  BP Readings from Last 3 Encounters:  01/03/24 134/82  11/15/23 132/76  10/19/23 (!) 159/82   Wt Readings from Last 3 Encounters:  01/03/24 172 lb 12.8 oz (78.4 kg)  11/15/23 174 lb (78.9 kg)  08/17/23 171 lb 3.2 oz (77.7 kg)    Physical Exam Constitutional:      Appearance: Normal appearance.  HENT:     Head: Normocephalic and atraumatic.     Nose: Nose normal.     Mouth/Throat:     Mouth: Mucous membranes are moist.  Eyes:     Extraocular Movements: Extraocular movements intact.     Conjunctiva/sclera: Conjunctivae normal.     Pupils: Pupils are equal, round, and reactive to light.  Cardiovascular:     Rate and Rhythm: Normal rate.  Pulmonary:     Effort: Pulmonary effort is normal.  Skin:    General: Skin is warm and dry.  Neurological:     Mental Status: She is alert and oriented to person, place, and time. Mental status is at  baseline.  Psychiatric:        Attention and Perception: Attention normal.        Mood and Affect: Mood is anxious.        Behavior: Behavior is cooperative.     Comments: Briefly tearful.     No results found for any visits on 01/03/24.    Assessment & Plan:  Prediabetes -     POCT glycosylated hemoglobin (Hb A1C)  Chronic diastolic CHF (congestive heart failure) (HCC) -Euvolemic -Continue Lasix 20 mg daily  Stage 2 chronic kidney disease  Other proteinuria  Essential hypertension  History of renal calculi  Patient seen for follow-up on chronic conditions and second opinion on medication.  Patient advised to consider trying Farxiga 10 mg daily given history of kidney disease, proteinuria with preserved GFR, and CHF.  Discussed r/b/a.  OFV notes and labs from nephrology sent via fax and reviewed in clinic with patient.  BP controlled.  Continue current medications.  On statin.  Discussed the importance of lifestyle modifications.  Return in about 3 months (around 04/04/2024).   Deeann Saint, MD

## 2024-01-03 NOTE — Patient Instructions (Signed)
 Consider starting farxiga 10 mg daily.

## 2024-01-04 ENCOUNTER — Encounter: Payer: Self-pay | Admitting: Obstetrics and Gynecology

## 2024-01-04 ENCOUNTER — Ambulatory Visit: Payer: Medicare Other | Admitting: Obstetrics and Gynecology

## 2024-01-04 VITALS — BP 105/70 | HR 85

## 2024-01-04 DIAGNOSIS — R350 Frequency of micturition: Secondary | ICD-10-CM | POA: Diagnosis not present

## 2024-01-04 DIAGNOSIS — N952 Postmenopausal atrophic vaginitis: Secondary | ICD-10-CM

## 2024-01-04 DIAGNOSIS — N3281 Overactive bladder: Secondary | ICD-10-CM | POA: Diagnosis not present

## 2024-01-04 MED ORDER — ESTRADIOL 0.1 MG/GM VA CREA: 0.5000 | TOPICAL_CREAM | VAGINAL | 12 refills | Status: DC

## 2024-01-04 MED ORDER — MIRABEGRON ER 25 MG PO TB24: 25.0000 mg | ORAL_TABLET | Freq: Every day | ORAL | 3 refills | Status: AC

## 2024-01-04 NOTE — Patient Instructions (Signed)
 Continue Myrbetriq 25mg  daily  Continue to use estrogen, suggested use is twice a week.   Please let me know if you have any issues and need support with your urine.

## 2024-01-04 NOTE — Progress Notes (Signed)
 [facility] Return Visit  SUBJECTIVE  History of Present Illness: Samantha Stevenson is a 73 y.o. female seen in follow-up for OAB. She has been taking Myrbetriq 25mg  and reports this is controlling her leakage well. She denies accidents or other concerns.   States she is using estrogen cream PRN.   She did have a UTI in January that was treated by PCP. She reports this assisted her ammonia smell in going away.     Past Medical History: Patient  has a past medical history of Allergy, Anemia, Arthritis, Atrial fibrillation (HCC), CHF (congestive heart failure) (HCC), Clotting disorder (HCC), Degenerative arthritis of hip, Dyspnea, Dysrhythmia, History of blood transfusion (05/2018), Hyperlipidemia, Hypertension, Intramural hematoma of thoracic aorta (HCC) (06/13/2018), Migraines, Myocardial infarction (HCC) (2000), Neuromuscular disorder (HCC), and Pulmonary emboli (HCC) (12/2012 dx).   Past Surgical History: She  has a past surgical history that includes Vagina reconstruction surgery (1966); Total hip arthroplasty (Left, 01/03/2013); Breast Ultrasound (Left, 04/16/2013); IR THORACENTESIS ASP PLEURAL SPACE W/IMG GUIDE (05/31/2018); Wound exploration (Left, 06/16/2018); Thoracic aortic endovascular stent graft (Left, 06/13/2018); Carotid-subclavian Bypass Graft (Left, 06/13/2018); Tonsillectomy; Abdominal hysterectomy; Tubal ligation; Cardiac catheterization; Artery repair (Left, 08/15/2018); DG TUMB RIGHT HAND (Right); Colonoscopy; Joint replacement; Anterior cervical decompression/discectomy fusion 4 level (N/A, 02/02/2020); Cystoscopy with retrograde pyelogram, ureteroscopy and stent placement (Bilateral, 11/02/2020); Knee arthroscopy (Left, 12/16/2020); and Bunionectomy (Left).   Medications: She has a current medication list which includes the following prescription(s): acetaminophen, aspirin ec, vitamin b complex, clindamycin, diltiazem, doxycycline, ferrous sulfate, furosemide,  linzess, metronidazole, multivitamin, rosuvastatin, [START ON 01/07/2024] estradiol, and mirabegron er, and the following Facility-Administered Medications: methylprednisolone acetate.   Allergies: Patient is allergic to penicillins, shellfish allergy, latex, banana, kiwi extract, lactose intolerance (gi), oxycodone, asa [aspirin], celebrex [celecoxib], and nsaids.   Social History: Patient  reports that she has never smoked. She has never used smokeless tobacco. She reports that she does not currently use alcohol. She reports that she does not use drugs.     OBJECTIVE     Physical Exam: Vitals:   01/04/24 0806  BP: 105/70  Pulse: 85   Gen: No apparent distress, A&O x 3.  Detailed Urogynecologic Evaluation:  Deferred.    ASSESSMENT AND PLAN    Ms. Roston is a 73 y.o. with:  1. Overactive bladder   2. Vaginal atrophy   3. Urinary frequency    Continue Myrbetriq 25mg  daily. She is doing well. No reported side effects and BP stable at today's visit. We discussed if this is not working well for her we can also go up on the dose, but she is doing well with the 25mg  so will continue that. Encouraged patient to use estrogen twice a week for maintenance and use lubrication for intercourse.  Not as bothersome on Myrbetriq. Encouraged her to call with any concerns.   Patient to follow up in 1 year for medication management or sooner if needed.     Selmer Dominion, NP

## 2024-01-07 ENCOUNTER — Ambulatory Visit: Admitting: Family Medicine

## 2024-01-07 DIAGNOSIS — Z87442 Personal history of urinary calculi: Secondary | ICD-10-CM | POA: Insufficient documentation

## 2024-01-14 DIAGNOSIS — N182 Chronic kidney disease, stage 2 (mild): Secondary | ICD-10-CM | POA: Diagnosis not present

## 2024-01-14 DIAGNOSIS — R7303 Prediabetes: Secondary | ICD-10-CM | POA: Diagnosis not present

## 2024-01-15 DIAGNOSIS — M4802 Spinal stenosis, cervical region: Secondary | ICD-10-CM | POA: Diagnosis not present

## 2024-01-15 DIAGNOSIS — M5412 Radiculopathy, cervical region: Secondary | ICD-10-CM | POA: Diagnosis not present

## 2024-01-15 DIAGNOSIS — Z6829 Body mass index (BMI) 29.0-29.9, adult: Secondary | ICD-10-CM | POA: Diagnosis not present

## 2024-01-16 ENCOUNTER — Other Ambulatory Visit: Payer: Self-pay | Admitting: Neurosurgery

## 2024-01-17 ENCOUNTER — Telehealth: Payer: Self-pay | Admitting: *Deleted

## 2024-01-17 ENCOUNTER — Ambulatory Visit: Attending: Physician Assistant

## 2024-01-17 DIAGNOSIS — Z01810 Encounter for preprocedural cardiovascular examination: Secondary | ICD-10-CM | POA: Diagnosis not present

## 2024-01-17 NOTE — Telephone Encounter (Signed)
 Pt has been added to Preop APP scheduled today, 3:00.

## 2024-01-17 NOTE — Telephone Encounter (Signed)
 Pt has been added to the Preop APP schedule, today, Per Jari Favre, NP.  Consent on file / medications reconciled.     Patient Consent for Virtual Visit   3      Samantha Stevenson has provided verbal consent on 01/17/2024 for a virtual visit (video or telephone).   CONSENT FOR VIRTUAL VISIT FOR:  Samantha Stevenson  By participating in this virtual visit I agree to the following:  I hereby voluntarily request, consent and authorize [facility] and its employed or contracted physicians, physician assistants, nurse practitioners or other licensed health care professionals (the Practitioner), to provide me with telemedicine health care services (the "Services") as deemed necessary by the treating Practitioner. I acknowledge and consent to receive the Services by the Practitioner via telemedicine. I understand that the telemedicine visit will involve communicating with the Practitioner through live audiovisual communication technology and the disclosure of certain medical information by electronic transmission. I acknowledge that I have been given the opportunity to request an in-person assessment or other available alternative prior to the telemedicine visit and am voluntarily participating in the telemedicine visit.  I understand that I have the right to withhold or withdraw my consent to the use of telemedicine in the course of my care at any time, without affecting my right to future care or treatment, and that the Practitioner or I may terminate the telemedicine visit at any time. I understand that I have the right to inspect all information obtained and/or recorded in the course of the telemedicine visit and may receive copies of available information for a reasonable fee.  I understand that some of the potential risks of receiving the Services via telemedicine include:  Delay or interruption in medical evaluation due to technological equipment failure or disruption; Information  transmitted may not be sufficient (e.g. poor resolution of images) to allow for appropriate medical decision making by the Practitioner; and/or  In rare instances, security protocols could fail, causing a breach of personal health information.  Furthermore, I acknowledge that it is my responsibility to provide information about my medical history, conditions and care that is complete and accurate to the best of my ability. I acknowledge that Practitioner's advice, recommendations, and/or decision may be based on factors not within their control, such as incomplete or inaccurate data provided by me or distortions of diagnostic images or specimens that may result from electronic transmissions. I understand that the practice of medicine is not an exact science and that Practitioner makes no warranties or guarantees regarding treatment outcomes. I acknowledge that a copy of this consent can be made available to me via my patient portal [facility] MyChart), or I can request a printed copy by calling the office of [facility].    I understand that my insurance will be billed for this visit.   I have read or had this consent read to me. I understand the contents of this consent, which adequately explains the benefits and risks of the Services being provided via telemedicine.  I have been provided ample opportunity to ask questions regarding this consent and the Services and have had my questions answered to my satisfaction. I give my informed consent for the services to be provided through the use of telemedicine in my medical care

## 2024-01-17 NOTE — Telephone Encounter (Signed)
   Name: Samantha Stevenson  DOB: [birth year]  MRN: [id number]  Primary Cardiologist: Regan Lemming, MD  Chart reviewed as part of pre-operative protocol coverage. Because of Samantha Stevenson's past medical history and time since last visit, she will require a follow-up in-office visit in order to better assess preoperative cardiovascular risk.  Pre-op covering staff: - Please schedule appointment and call patient to inform them. If patient already had an upcoming appointment within acceptable timeframe, please add "pre-op clearance" to the appointment notes so provider is aware. - Please contact requesting surgeon's office via preferred method (i.e, phone, fax) to inform them of need for appointment prior to surgery.  As long as no new cardiovascular symptoms can hold aspirin for 5 to 7 days prior to procedure.  Please resume when medically safe to do so.  Sharlene Dory, PA-C  01/17/2024, 9:59 AM

## 2024-01-17 NOTE — Progress Notes (Signed)
 Virtual Visit via Telephone Note   Because of Samantha Stevenson co-morbid illnesses, she is at least at moderate risk for complications without adequate follow up.  This format is felt to be most appropriate for this patient at this time.  Due to technical limitations with video connection (technology), today's appointment will be conducted as an audio only telehealth visit, and Samantha Stevenson verbally agreed to proceed in this manner.   All issues noted in this document were discussed and addressed.  No physical exam could be performed with this format.  Evaluation Performed:  Preoperative cardiovascular risk assessment _____________   Date:  01/17/2024   Patient ID:  Samantha Stevenson, DOB 1951-04-04, MRN [id number] Patient Location:  Home Provider location:   Office  Primary Care Provider:  Deeann Saint, MD Primary Cardiologist:  Will Jorja Loa, MD  Chief Complaint / Patient Profile   73 y.o. y/o female with a h/o hypertension, hyperlipidemia, PE post hip replacement 2014, A-fib,  Intramural hematoma of the thoracic aorta extending from left subclavian artery to the renal artery origins who is pending C2 anterior cervical fusion and presents today for telephonic preoperative cardiovascular risk assessment.  History of Present Illness    Samantha Stevenson is a 73 y.o. female who presents via audio/video conferencing for a telehealth visit today.  Pt was last seen in cardiology clinic on 08/17/2023 by Francis Dowse, PA-C.  At that time Blonnie Maske was doing well.  The patient is now pending procedure as outlined above. Since her last visit, she tells me she has been feeling fine without any chest pains or shortness of breath.  She does have some leg swelling which is chronic.  This has been well-controlled.  She goes to the gym and is fairly active.  She does surpass 4 METS on the DASI.  She can hold her aspirin for 5 to 7 days prior to procedure and resume when  medically safe to do so.  Past Medical History    Past Medical History:  Diagnosis Date   Allergy    Anemia    Arthritis    "qwhere" (07/29/2018)   Atrial fibrillation (HCC)    CHF (congestive heart failure) (HCC)    Clotting disorder (HCC)    Degenerative arthritis of hip    s/p L THR 12/2012   Dyspnea    since surgery in August 2019- "when I get worked up and walk a short distance"   Dysrhythmia    History of blood transfusion 05/2018   "related to OR"   Hyperlipidemia    Hypertension    Intramural hematoma of thoracic aorta (HCC) 06/13/2018   Migraines    mIgraines- none since blood pressure and lipids are under control   Myocardial infarction (HCC) 2000   due to atrial fib   Neuromuscular disorder (HCC)    pinched nerve- left side of neck   Pulmonary emboli (HCC) 12/2012 dx   post op (L THR), anticoag x 26mo   Past Surgical History:  Procedure Laterality Date   ABDOMINAL HYSTERECTOMY     "w/1 tube"   ANTERIOR CERVICAL DECOMPRESSION/DISCECTOMY FUSION 4 LEVELS N/A 02/02/2020   Procedure: CERVICAL THREE-FOUR, CERVICAL FOUR-FIVE, CERVICAL FIVE-SIX, CERVICAL SIX-SEVEN ANTERIOR CERVICAL DECOMPRESSION/DISCECTOMY FUSION;  Surgeon: Julio Sicks, MD;  Location: [facility] OR;  Service: Neurosurgery;  Laterality: N/A;   ARTERY REPAIR Left 08/15/2018   Procedure: LEFT BRACHIAL ARTERY EXPLORATION;  Surgeon: Maeola Harman, MD;  Location[facility] OR;  Service: Vascular;  Laterality: Left;   Breast  Ultrasound Left 04/16/2013   Done @ breast center Impression: no malignancy appearance noted on the screen study is consistent with a summation shadow   BUNIONECTOMY Left    CARDIAC CATHETERIZATION     CAROTID-SUBCLAVIAN BYPASS GRAFT Left 06/13/2018   Procedure: BYPASS GRAFT CAROTID-SUBCLAVIAN;  Surgeon: Maeola Harman, MD;  Location: [facility] OR;  Service: Vascular;  Laterality: Left;   COLONOSCOPY     CYSTOSCOPY WITH RETROGRADE PYELOGRAM, URETEROSCOPY AND STENT PLACEMENT Bilateral  11/02/2020   Procedure: DIAGNOSTIC CYSTOSCOPY WITH RETROGRADE PYELOGRAM,BILATERAL DIAGNOSTIC URETEROSCOPY  AND BILATERAL STENT PLACEMENT;  Surgeon: Noel Christmas, MD;  Location: [facility];  Service: Urology;  Laterality: Bilateral;  90 MINS   DG TUMB RIGHT HAND Right    cyst removal   IR THORACENTESIS ASP PLEURAL SPACE W/IMG GUIDE  05/31/2018   JOINT REPLACEMENT     KNEE ARTHROSCOPY Left 12/16/2020   Procedure: LEFT KNEE ARTHROSCOPY WITH PARTIAL LATERAL MENISCECTOMY;  Surgeon: Kathryne Hitch, MD;  Location: [facility];  Service: Orthopedics;  Laterality: Left;   THORACIC AORTIC ENDOVASCULAR STENT GRAFT Left 06/13/2018   Procedure: LEFT  SUBCLAVIAN TO CAROTID ARTERY TRANSPOSITION; THORACIC AORTIC ENDOVASCULAR STENT GRAFT using GORE CONFORMABLE THORACIC STENT GRAFT AND GORE TAG THORACIC ENDOPROSTHESIS; STENT LEFT COMMON CAROTID ARTERY, RIGHT COMMON-FEMORAL ENDARTERECTOMY WITH PATCH ANGIOPLASTY;  Surgeon: Maeola Harman, MD;  Location: [facility] OR;  Service: Vascular;  Laterality: Left;   TONSILLECTOMY     TOTAL HIP ARTHROPLASTY Left 01/03/2013   Procedure: TOTAL HIP ARTHROPLASTY ANTERIOR APPROACH;  Surgeon: Kathryne Hitch, MD;  Location: [facility] ORS;  Service: Orthopedics;  Laterality: Left;  Left Total Hip Arthroplasty, Anterior Approach   TUBAL LIGATION     VAGINA RECONSTRUCTION SURGERY  1966   vagina had closed up when 73 years old   WOUND EXPLORATION Left 06/16/2018   Procedure: LEFT NECK WOUND EXPLORATION, CHEST TUBE INSERTION;  Surgeon: Maeola Harman, MD;  Location: [facility] OR;  Service: Vascular;  Laterality: Left;    Allergies  Allergies  Allergen Reactions   Penicillins Swelling and Other (See Comments)    Severe swelling PATIENT HAS HAD A PCN REACTION WITH IMMEDIATE RASH, FACIAL/TONGUE/THROAT SWELLING, SOB, OR LIGHTHEADEDNESS WITH HYPOTENSION:  #  #  YES  #  #  Has patient had a PCN reaction causing severe rash involving  mucus membranes or skin necrosis: No PATIENT HAS HAD A PCN REACTION THAT REQUIRED HOSPITALIZATION:  #  #  YES  #  #  Has patient had a PCN reaction occurring within the last 10 years: No   Shellfish Allergy Anaphylaxis   Latex Hives and Rash   Banana Nausea Only   Kiwi Extract    Lactose Intolerance (Gi) Diarrhea and Nausea And Vomiting   Oxycodone Other (See Comments)    Pt stated, "Made me feel really drowsy" Tolerating as of 02/16/20 after neck surgery   Asa [Aspirin] Nausea And Vomiting    Makes stomach upset Asprin 325   Celebrex [Celecoxib] Nausea And Vomiting    GI upset   Nsaids Rash    Home Medications    Prior to Admission medications   Medication Sig Start Date End Date Taking? Authorizing Provider  acetaminophen (TYLENOL) 500 MG tablet Take 1,000 mg by mouth every 8 (eight) hours as needed for moderate pain.    [provider]  aspirin EC 81 MG tablet Take 81 mg by mouth at bedtime.    [provider]  B Complex Vitamins (VITAMIN B COMPLEX)  TABS Take 1 tablet by mouth daily.    [provider]  clindamycin (CLEOCIN T) 1 % lotion Apply 1 application. topically as needed. 12/13/21   [provider]  dapagliflozin propanediol (FARXIGA) 10 MG TABS tablet Take 10 mg by mouth daily.    [provider]  diltiazem (DILACOR XR) 240 MG 24 hr capsule Take 1 capsule (240 mg total) by mouth every morning. 08/17/23   Sheilah Pigeon, PA-C  doxycycline (VIBRAMYCIN) 100 MG capsule Take 100 mg by mouth daily. 12/18/23   [provider]  ferrous sulfate 325 (65 FE) MG tablet Take 325 mg by mouth daily with breakfast.    [provider]  furosemide (LASIX) 20 MG tablet Take 1 tablet (20 mg total) by mouth at bedtime. 02/14/23   Sheilah Pigeon, PA-C  LINZESS 72 MCG capsule TAKE 1 CAPSULE BY MOUTH DAILY BEFORE BREAKFAST 07/06/22   Sherrilyn Rist, MD  metroNIDAZOLE (METROCREAM) 0.75 % cream Apply 1 application. topically as  needed. 12/13/21   [provider]  mirabegron ER (MYRBETRIQ) 25 MG TB24 tablet Take 1 tablet (25 mg total) by mouth daily. 01/04/24   Selmer Dominion, NP  Multiple Vitamin (MULTIVITAMIN) tablet Take 2 tablets by mouth daily. Gummies    [provider]  rosuvastatin (CRESTOR) 20 MG tablet TAKE 1 TABLET BY MOUTH EVERY DAY 08/17/23   Sheilah Pigeon, PA-C    Physical Exam    Vital Signs:  Lanae Federer does not have vital signs available for review today.  Given telephonic nature of communication, physical exam is limited. AAOx3. NAD. Normal affect.  Speech and respirations are unlabored.  Accessory Clinical Findings    None  Assessment & Plan    1.  Preoperative Cardiovascular Risk Assessment:  Ms. Brazie's perioperative risk of a major cardiac event is 0.9% according to the Revised Cardiac Risk Index (RCRI).  Therefore, she is at low risk for perioperative complications.   Her functional capacity is good at 5.81 METs according to the Duke Activity Status Index (DASI). Recommendations: According to ACC/AHA guidelines, no further cardiovascular testing needed.  The patient may proceed to surgery at acceptable risk.   Antiplatelet and/or Anticoagulation Recommendations: Aspirin can be held for 5-7 days prior to her surgery.  Please resume Aspirin post operatively when it is felt to be safe from a bleeding standpoint.   The patient was advised that if she develops new symptoms prior to surgery to contact our office to arrange for a follow-up visit, and she verbalized understanding.   A copy of this note will be routed to requesting surgeon.  Time:   Today, I have spent 5 minutes with the patient with telehealth technology discussing medical history, symptoms, and management plan.     Sharlene Dory, PA-C  01/17/2024, 3:18 PM

## 2024-01-17 NOTE — Telephone Encounter (Signed)
 1st attempt to reach pt regarding surgical clearance and the need for a tele visit, today.  Left a message for pt to call back and ask for preop team.

## 2024-01-17 NOTE — Telephone Encounter (Signed)
   Pre-operative Risk Assessment    Patient Name: Samantha Stevenson  DOB: 09/19/1951 MRN: [id number]   Date of last office visit: 07/2023 Date of next office visit: 02/20/2024   Request for Surgical Clearance    Procedure:   C2-E ANTERIOR CERVICAL FUSION  Date of Surgery:  Clearance 01/21/24                                Surgeon:  Temple Pacini Surgeon's Group or Practice Name:  [facility] Phone number:  [phone] Fax number:  [phone]   Type of Clearance Requested:   - Medical  - Pharmacy:  Hold Aspirin ASKING HOW MANY DAYS PT CAN HOLD   Type of Anesthesia:  General    Additional requests/questions:    Wilhemina Cash   01/17/2024, 9:30 AM

## 2024-01-18 ENCOUNTER — Telehealth: Payer: Self-pay | Admitting: Cardiology

## 2024-01-18 ENCOUNTER — Other Ambulatory Visit: Payer: Self-pay

## 2024-01-18 ENCOUNTER — Encounter (HOSPITAL_COMMUNITY): Payer: Self-pay | Admitting: Neurosurgery

## 2024-01-18 NOTE — Progress Notes (Signed)
 History and Med rec will need to be done day of surgery. Pt hesitant to give medical information over the phone d/t past history with scams. Instructions were given. Pt to bring list of meds with her on DOS.   Aspirin Instructions: "She can hold her aspirin for 5 to 7 days prior to procedure and resume when medically safe to do so." 03/27 note from Jari Favre, [place]  Anesthesia review: Y  Patient verbally denies any shortness of breath, fever, cough and chest pain during phone call   -------------  SDW INSTRUCTIONS given:  Your procedure is scheduled on Monday March 31st.  Report to [facility] Main Entrance "A" at 0830 A.M., and check in at the Admitting office.  Call this number if you have problems the morning of surgery:  [phone]   Remember:  Do not eat or drink after midnight the night before your surgery    Take these medicines the morning of surgery with A SIP OF WATER  acetaminophen (TYLENOL)-if needed  **LAST DOSE of  (FARXIGA) will be FRIDAY, [facility] 28th**    As of today, STOP taking any Aspirin (unless otherwise instructed by your surgeon) Aleve, Naproxen, Ibuprofen, Motrin, Advil, Goody's, BC's, all herbal medications, fish oil, and all vitamins.                      Do not wear jewelry, make up, or nail polish            Do not wear lotions, powders, perfumes/colognes, or deodorant.            Do not shave 48 hours prior to surgery.  Men may shave face and neck.            Do not bring valuables to the hospital.            [facility] is not responsible for any belongings or valuables.  Do NOT Smoke (Tobacco/Vaping) 24 hours prior to your procedure If you use a CPAP at night, you may bring all equipment for your overnight stay.   Contacts, glasses, dentures or bridgework may not be worn into surgery.      For patients admitted to the hospital, discharge time will be determined by your treatment team.   Patients discharged the day of surgery will not be allowed  to drive home, and someone needs to stay with them for 24 hours.    Special instructions:   [facility]- Preparing For Surgery  Before surgery, you can play an important role. Because skin is not sterile, your skin needs to be as free of germs as possible. You can reduce the number of germs on your skin by washing with CHG (chlorahexidine gluconate) Soap before surgery.  CHG is an antiseptic cleaner which kills germs and bonds with the skin to continue killing germs even after washing.    Oral Hygiene is also important to reduce your risk of infection.  Remember - BRUSH YOUR TEETH THE MORNING OF SURGERY WITH YOUR REGULAR TOOTHPASTE  Please do not use if you have an allergy to CHG or antibacterial soaps. If your skin becomes reddened/irritated stop using the CHG.  Do not shave (including legs and underarms) for at least 48 hours prior to first CHG shower. It is OK to shave your face.  Please follow these instructions carefully.   Shower the NIGHT BEFORE SURGERY and the MORNING OF SURGERY with DIAL Soap.   Pat yourself dry with a CLEAN TOWEL.  Wear CLEAN PAJAMAS to bed the night before surgery  Place CLEAN SHEETS on your bed the night of your first shower and DO NOT SLEEP WITH PETS.   Day of Surgery: Please shower morning of surgery  Wear Clean/Comfortable clothing the morning of surgery Do not apply any deodorants/lotions.   Remember to brush your teeth WITH YOUR REGULAR TOOTHPASTE.   Questions were answered. Patient verbalized understanding of instructions.

## 2024-01-18 NOTE — Anesthesia Preprocedure Evaluation (Addendum)
 Anesthesia Evaluation  Patient identified by MRN, date of birth, ID band Patient awake    Reviewed: Allergy & Precautions, NPO status , Patient's Chart, lab work & pertinent test results  Airway Mallampati: I  TM Distance: >3 FB Neck ROM: Full    Dental  (+) Edentulous Upper, Dental Advisory Given   Pulmonary PE   Pulmonary exam normal breath sounds clear to auscultation       Cardiovascular hypertension, + Past MI, + Peripheral Vascular Disease and +CHF  Normal cardiovascular exam+ dysrhythmias Atrial Fibrillation  Rhythm:Regular Rate:Normal  TTE 2024 1. Left ventricular ejection fraction, by estimation, is 60 to 65%. The  left ventricle has normal function. The left ventricle has no regional  wall motion abnormalities. Left ventricular diastolic parameters are  consistent with Grade I diastolic  dysfunction (impaired relaxation).   2. Right ventricular systolic function is normal. The right ventricular  size is normal. There is normal pulmonary artery systolic pressure.   3. The mitral valve is normal in structure. Trivial mitral valve  regurgitation. No evidence of mitral stenosis.   4. The aortic valve is tricuspid. Aortic valve regurgitation is not  visualized. No aortic stenosis is present.   5. The inferior vena cava is normal in size with greater than 50%  respiratory variability, suggesting right atrial pressure of 3 mmHg.     Neuro/Psych  Headaches  negative psych ROS   GI/Hepatic negative GI ROS, Neg liver ROS,,,  Endo/Other  negative endocrine ROS    Renal/GU negative Renal ROS  negative genitourinary   Musculoskeletal  (+) Arthritis ,    Abdominal   Peds  Hematology negative hematology ROS (+)   Anesthesia Other Findings   Reproductive/Obstetrics                             Anesthesia Physical Anesthesia Plan  ASA: 3  Anesthesia Plan: General   Post-op Pain  Management: Tylenol PO (pre-op)*   Induction: Intravenous  PONV Risk Score and Plan: 3 and Dexamethasone, Ondansetron and Treatment may vary due to age or medical condition  Airway Management Planned: Oral ETT and Video Laryngoscope Planned  Additional Equipment:   Intra-op Plan:   Post-operative Plan: Extubation in OR  Informed Consent: I have reviewed the patients History and Physical, chart, labs and discussed the procedure including the risks, benefits and alternatives for the proposed anesthesia with the patient or authorized representative who has indicated his/her understanding and acceptance.     Dental advisory given  Plan Discussed with: CRNA  Anesthesia Plan Comments: (PAT note by Antionette Poles, PA-C: 73 year old female follows with cardiology for history of HTN, HLD, postop PE after hip replacement 2014, questionable paroxysmal A-fib (not on anticoagulation).  Echo 02/2023 showed EF 60 to 65%, grade 1 DD, normal RV function, no significant valvular abnormalities.  Nuclear stress 09/2019 was low risk.  Seen by Jari Favre, PA-C on 01/17/2024 for preop valuation.  Per note, "Ms. Pearse's perioperative risk of a major cardiac event is 0.9% according to the Revised Cardiac Risk Index (RCRI).  Therefore, she is at low risk for perioperative complications.   Her functional capacity is good at 5.81 METs according to the Duke Activity Status Index (DASI). Recommendations: According to ACC/AHA guidelines, no further cardiovascular testing needed.  The patient may proceed to surgery at acceptable risk.  Antiplatelet and/or Anticoagulation Recommendations: Aspirin can be held for 5-7 days prior to her surgery.  Please resume Aspirin  post operatively when it is felt to be safe from a bleeding standpoint."  Follows with vascular surgery for history of thoracic endografting with subclavian artery transposition of the common carotid artery with subsequent stent of the left common carotid artery  in 05/2018.  Stable endograft without evidence of aneurysm or dissection by CTA 01/29/23.  History of C3-7 ACDF.  Patient will need day of surgery labs and evaluation.  EKG 01/29/2023: Normal sinus rhythm.  Rate 75. Possible Left atrial enlargement. Left anterior fascicular block. Possible Anterior infarct , age undetermined  CTA chest abdomen pelvis 01/29/2023: IMPRESSION: 1. Stable thoracic aortic endograft without evidence of aneurysm or dissection. 2. Aortic atherosclerosis with mild aneurysmal dilatation of the proximal abdominal aorta measuring up to 3.6 cm, decreased from 3.8 cm on the previous exam. Recommend follow-up ultrasound every 2 years. This recommendation follows ACR consensus guidelines: White Paper of the ACR Incidental Findings Committee II on Vascular Findings. J Am Coll Radiol 2013; 10:789-794. 3. Cardiomegaly with coronary artery calcifications. 4. Remaining incidental findings as described above.  TTE 03/16/2023: 1. Left ventricular ejection fraction, by estimation, is 60 to 65%. The  left ventricle has normal function. The left ventricle has no regional  wall motion abnormalities. Left ventricular diastolic parameters are  consistent with Grade I diastolic  dysfunction (impaired relaxation).  2. Right ventricular systolic function is normal. The right ventricular  size is normal. There is normal pulmonary artery systolic pressure.  3. The mitral valve is normal in structure. Trivial mitral valve  regurgitation. No evidence of mitral stenosis.  4. The aortic valve is tricuspid. Aortic valve regurgitation is not  visualized. No aortic stenosis is present.  5. The inferior vena cava is normal in size with greater than 50%  respiratory variability, suggesting right atrial pressure of 3 mmHg.   Nuclear stress test 10/20/2019:  Nuclear stress EF: 66%.  There was no ST segment deviation noted during stress.  No T wave inversion was noted during stress.  The  study is normal.  This is a low risk study. No ischemia.  The left ventricular ejection fraction is hyperdynamic (>65%).  )        Anesthesia Quick Evaluation

## 2024-01-18 NOTE — Telephone Encounter (Signed)
 Patient stated she is returning a call from she believes Pre-op regarding a list of her medication.  Patient wants a call back to discuss instructions for upcoming procedure.

## 2024-01-18 NOTE — Telephone Encounter (Signed)
 S/w pt X 2.  Pt received a call yesterday and today stated was a pharmacy but would not give name to go over pts medications. Pt is having a procedure and wanted to make sure # [phone] was legit. When looked up number stated robo call. Reached out to [employer] and stated. Yeah it looks like a spam call to me. Make sure patient doesn't give out any information and block the number. It's not our team.Clarified with patient.

## 2024-01-18 NOTE — Progress Notes (Signed)
 Anesthesia Chart Review: Same-day workup  73 year old female follows with cardiology for history of HTN, HLD, postop PE after hip replacement 2014, questionable paroxysmal A-fib (not on anticoagulation).  Echo 02/2023 showed EF 60 to 65%, grade 1 DD, normal RV function, no significant valvular abnormalities.  Nuclear stress 09/2019 was low risk.  Seen by Jari Favre, PA-C on 01/17/2024 for preop valuation.  Per note, "Ms. Samantha Stevenson's perioperative risk of a major cardiac event is 0.9% according to the Revised Cardiac Risk Index (RCRI).  Therefore, she is at low risk for perioperative complications.   Her functional capacity is good at 5.81 METs according to the Duke Activity Status Index (DASI). Recommendations: According to ACC/AHA guidelines, no further cardiovascular testing needed.  The patient may proceed to surgery at acceptable risk.  Antiplatelet and/or Anticoagulation Recommendations: Aspirin can be held for 5-7 days prior to her surgery.  Please resume Aspirin post operatively when it is felt to be safe from a bleeding standpoint."  Follows with vascular surgery for history of thoracic endografting with subclavian artery transposition of the common carotid artery with subsequent stent of the left common carotid artery in 05/2018.  Stable endograft without evidence of aneurysm or dissection by CTA 01/29/23.  History of C3-7 ACDF.  Patient will need day of surgery labs and evaluation.  EKG 01/29/2023: Normal sinus rhythm.  Rate 75. Possible Left atrial enlargement. Left anterior fascicular block. Possible Anterior infarct , age undetermined  CTA chest abdomen pelvis 01/29/2023: IMPRESSION: 1. Stable thoracic aortic endograft without evidence of aneurysm or dissection. 2. Aortic atherosclerosis with mild aneurysmal dilatation of the proximal abdominal aorta measuring up to 3.6 cm, decreased from 3.8 cm on the previous exam. Recommend follow-up ultrasound every 2 years. This recommendation follows  ACR consensus guidelines: White Paper of the ACR Incidental Findings Committee II on Vascular Findings. J Am Coll Radiol 2013; 10:789-794. 3. Cardiomegaly with coronary artery calcifications. 4. Remaining incidental findings as described above.  TTE 03/16/2023:  1. Left ventricular ejection fraction, by estimation, is 60 to 65%. The  left ventricle has normal function. The left ventricle has no regional  wall motion abnormalities. Left ventricular diastolic parameters are  consistent with Grade I diastolic  dysfunction (impaired relaxation).   2. Right ventricular systolic function is normal. The right ventricular  size is normal. There is normal pulmonary artery systolic pressure.   3. The mitral valve is normal in structure. Trivial mitral valve  regurgitation. No evidence of mitral stenosis.   4. The aortic valve is tricuspid. Aortic valve regurgitation is not  visualized. No aortic stenosis is present.   5. The inferior vena cava is normal in size with greater than 50%  respiratory variability, suggesting right atrial pressure of 3 mmHg.   Nuclear stress test 10/20/2019: Nuclear stress EF: 66%. There was no ST segment deviation noted during stress. No T wave inversion was noted during stress. The study is normal. This is a low risk study. No ischemia. The left ventricular ejection fraction is hyperdynamic (>65%).   Zannie C[place] [facility] Short Stay Center/Anesthesiology Phone [phone] 01/18/2024 10:26 AM

## 2024-01-21 ENCOUNTER — Observation Stay (HOSPITAL_COMMUNITY)
Admission: RE | Admit: 2024-01-21 | Discharge: 2024-01-22 | Disposition: A | Discharge status: Home / Self Care | Attending: Neurosurgery | Admitting: Neurosurgery

## 2024-01-21 ENCOUNTER — Ambulatory Visit (HOSPITAL_COMMUNITY): Payer: Self-pay | Admitting: Physician Assistant

## 2024-01-21 ENCOUNTER — Ambulatory Visit (HOSPITAL_COMMUNITY)

## 2024-01-21 ENCOUNTER — Ambulatory Visit (HOSPITAL_COMMUNITY)
Admission: RE | Disposition: A | Discharge status: Home / Self Care | Payer: Self-pay | Source: Home / Self Care | Attending: Neurosurgery

## 2024-01-21 ENCOUNTER — Ambulatory Visit (HOSPITAL_BASED_OUTPATIENT_CLINIC_OR_DEPARTMENT_OTHER): Payer: Self-pay | Admitting: Physician Assistant

## 2024-01-21 ENCOUNTER — Other Ambulatory Visit: Payer: Self-pay

## 2024-01-21 DIAGNOSIS — Z96642 Presence of left artificial hip joint: Secondary | ICD-10-CM | POA: Diagnosis not present

## 2024-01-21 DIAGNOSIS — Z86711 Personal history of pulmonary embolism: Secondary | ICD-10-CM | POA: Diagnosis not present

## 2024-01-21 DIAGNOSIS — Z79899 Other long term (current) drug therapy: Secondary | ICD-10-CM | POA: Diagnosis not present

## 2024-01-21 DIAGNOSIS — M5001 Cervical disc disorder with myelopathy,  high cervical region: Secondary | ICD-10-CM | POA: Diagnosis not present

## 2024-01-21 DIAGNOSIS — G992 Myelopathy in diseases classified elsewhere: Principal | ICD-10-CM | POA: Diagnosis present

## 2024-01-21 DIAGNOSIS — Z7982 Long term (current) use of aspirin: Secondary | ICD-10-CM | POA: Diagnosis not present

## 2024-01-21 DIAGNOSIS — Z981 Arthrodesis status: Secondary | ICD-10-CM | POA: Diagnosis not present

## 2024-01-21 DIAGNOSIS — I110 Hypertensive heart disease with heart failure: Secondary | ICD-10-CM | POA: Insufficient documentation

## 2024-01-21 DIAGNOSIS — Z91040 Latex allergy status: Secondary | ICD-10-CM | POA: Insufficient documentation

## 2024-01-21 DIAGNOSIS — I252 Old myocardial infarction: Secondary | ICD-10-CM | POA: Diagnosis not present

## 2024-01-21 DIAGNOSIS — M4802 Spinal stenosis, cervical region: Secondary | ICD-10-CM | POA: Diagnosis not present

## 2024-01-21 DIAGNOSIS — I509 Heart failure, unspecified: Secondary | ICD-10-CM | POA: Diagnosis not present

## 2024-01-21 DIAGNOSIS — I5032 Chronic diastolic (congestive) heart failure: Secondary | ICD-10-CM | POA: Diagnosis not present

## 2024-01-21 DIAGNOSIS — I4891 Unspecified atrial fibrillation: Secondary | ICD-10-CM | POA: Diagnosis not present

## 2024-01-21 HISTORY — DX: Prediabetes: R73.03

## 2024-01-21 HISTORY — PX: ANTERIOR CERVICAL DECOMP/DISCECTOMY FUSION: SHX1161

## 2024-01-21 LAB — CBC
HCT: 33.3 % — ABNORMAL LOW (ref 36.0–46.0)
Hemoglobin: 10.4 g/dL — ABNORMAL LOW (ref 12.0–15.0)
MCH: 28.3 pg (ref 26.0–34.0)
MCHC: 31.2 g/dL (ref 30.0–36.0)
MCV: 90.5 fL (ref 80.0–100.0)
Platelets: 193 10*3/uL (ref 150–400)
RBC: 3.68 MIL/uL — ABNORMAL LOW (ref 3.87–5.11)
RDW: 15.4 % (ref 11.5–15.5)
WBC: 4.4 10*3/uL (ref 4.0–10.5)
nRBC: 0 % (ref 0.0–0.2)

## 2024-01-21 LAB — SURGICAL PCR SCREEN
MRSA, PCR: NEGATIVE
Staphylococcus aureus: NEGATIVE

## 2024-01-21 LAB — BASIC METABOLIC PANEL WITH GFR
Anion gap: 9 (ref 5–15)
BUN: 17 mg/dL (ref 8–23)
CO2: 21 mmol/L — ABNORMAL LOW (ref 22–32)
Calcium: 8.7 mg/dL — ABNORMAL LOW (ref 8.9–10.3)
Chloride: 108 mmol/L (ref 98–111)
Creatinine, Ser: 0.71 mg/dL (ref 0.44–1.00)
GFR, Estimated: >60 mL/min (ref >60)
Glucose, Bld: 81 mg/dL (ref 70–99)
Potassium: 3.7 mmol/L (ref 3.5–5.1)
Sodium: 138 mmol/L (ref 135–145)

## 2024-01-21 SURGERY — ANTERIOR CERVICAL DECOMPRESSION/DISCECTOMY FUSION 1 LEVEL
Anesthesia: General

## 2024-01-21 MED ORDER — PHENYLEPHRINE HCL-NACL 20-0.9 MG/250ML-% IV SOLN
INTRAVENOUS | Status: DC | PRN
  Administered 2024-01-21: 20 ug/min via INTRAVENOUS

## 2024-01-21 MED ORDER — ORAL CARE MOUTH RINSE: 15.0000 mL | Freq: Once | OROMUCOSAL | Status: AC

## 2024-01-21 MED ORDER — MIDAZOLAM HCL 2 MG/2ML IJ SOLN
INTRAMUSCULAR | Status: DC | PRN
  Administered 2024-01-21: 1 mg via INTRAVENOUS

## 2024-01-21 MED ORDER — FERROUS SULFATE 325 (65 FE) MG PO TABS
325.0000 mg | ORAL_TABLET | ORAL | Status: DC
  Filled 2024-01-21: qty 1

## 2024-01-21 MED ORDER — AMISULPRIDE (ANTIEMETIC) 5 MG/2ML IV SOLN: 10.0000 mg | Freq: Once | INTRAVENOUS | Status: DC | PRN

## 2024-01-21 MED ORDER — SUGAMMADEX SODIUM 200 MG/2ML IV SOLN
INTRAVENOUS | Status: DC | PRN
  Administered 2024-01-21: 152.4 mg via INTRAVENOUS

## 2024-01-21 MED ORDER — MUPIROCIN 2 % EX OINT
TOPICAL_OINTMENT | CUTANEOUS | Status: AC
Start: 2024-01-21 — End: 2024-01-21
  Filled 2024-01-21: qty 22

## 2024-01-21 MED ORDER — HYDROMORPHONE HCL 1 MG/ML IJ SOLN: 1.0000 mg | INTRAMUSCULAR | Status: DC | PRN

## 2024-01-21 MED ORDER — PHENOL 1.4 % MT LIQD
1.0000 | OROMUCOSAL | Status: DC | PRN
  Administered 2024-01-22: 1 via OROMUCOSAL
  Filled 2024-01-21: qty 177

## 2024-01-21 MED ORDER — DOCUSATE SODIUM 100 MG PO CAPS
100.0000 mg | ORAL_CAPSULE | Freq: Two times a day (BID) | ORAL | Status: DC | PRN
  Filled 2024-01-21: qty 1

## 2024-01-21 MED ORDER — GLYCOPYRROLATE 0.2 MG/ML IJ SOLN
INTRAMUSCULAR | Status: DC | PRN
  Administered 2024-01-21: .1 mg via INTRAVENOUS

## 2024-01-21 MED ORDER — MIRABEGRON ER 25 MG PO TB24
25.0000 mg | ORAL_TABLET | Freq: Every day | ORAL | Status: DC
  Administered 2024-01-21 – 2024-01-22 (×2): 25 mg via ORAL
  Filled 2024-01-21 (×2): qty 1

## 2024-01-21 MED ORDER — SODIUM CHLORIDE 0.9% FLUSH
3.0000 mL | Freq: Two times a day (BID) | INTRAVENOUS | Status: DC
  Administered 2024-01-21: 3 mL via INTRAVENOUS

## 2024-01-21 MED ORDER — DILTIAZEM HCL ER COATED BEADS 240 MG PO CP24
240.0000 mg | ORAL_CAPSULE | Freq: Every morning | ORAL | Status: DC
Start: 2024-01-22 — End: 2024-01-22
  Administered 2024-01-22: 240 mg via ORAL
  Filled 2024-01-21: qty 1

## 2024-01-21 MED ORDER — ADULT MULTIVITAMIN W/MINERALS CH
2.0000 | ORAL_TABLET | Freq: Every day | ORAL | Status: DC
  Administered 2024-01-21: 2 via ORAL
  Filled 2024-01-21: qty 2

## 2024-01-21 MED ORDER — HYDROCODONE-ACETAMINOPHEN 10-325 MG PO TABS: 1.0000 | ORAL_TABLET | ORAL | Status: DC | PRN

## 2024-01-21 MED ORDER — CHLORHEXIDINE GLUCONATE CLOTH 2 % EX PADS: 6.0000 | MEDICATED_PAD | Freq: Once | CUTANEOUS | Status: DC

## 2024-01-21 MED ORDER — CEFAZOLIN SODIUM-DEXTROSE 1-4 GM/50ML-% IV SOLN
1.0000 g | Freq: Three times a day (TID) | INTRAVENOUS | Status: AC
  Administered 2024-01-21 – 2024-01-22 (×2): 1 g via INTRAVENOUS
  Filled 2024-01-21 (×2): qty 50

## 2024-01-21 MED ORDER — ACETAMINOPHEN 650 MG RE SUPP: 650.0000 mg | RECTAL | Status: DC | PRN

## 2024-01-21 MED ORDER — THROMBIN 5000 UNITS EX KIT
PACK | CUTANEOUS | Status: AC
  Filled 2024-01-21: qty 2

## 2024-01-21 MED ORDER — MIDAZOLAM HCL 2 MG/2ML IJ SOLN
INTRAMUSCULAR | Status: AC
  Filled 2024-01-21: qty 2

## 2024-01-21 MED ORDER — ACETAMINOPHEN 500 MG PO TABS
1000.0000 mg | ORAL_TABLET | Freq: Once | ORAL | Status: AC
  Administered 2024-01-21: 1000 mg via ORAL
  Filled 2024-01-21: qty 2

## 2024-01-21 MED ORDER — OXYCODONE HCL 5 MG PO TABS
ORAL_TABLET | ORAL | Status: AC
  Filled 2024-01-21: qty 1

## 2024-01-21 MED ORDER — CHLORHEXIDINE GLUCONATE 0.12 % MT SOLN: 15.0000 mL | Freq: Once | OROMUCOSAL | Status: AC

## 2024-01-21 MED ORDER — FENTANYL CITRATE (PF) 100 MCG/2ML IJ SOLN
INTRAMUSCULAR | Status: AC
  Filled 2024-01-21: qty 2

## 2024-01-21 MED ORDER — LIDOCAINE 2% (20 MG/ML) 5 ML SYRINGE
INTRAMUSCULAR | Status: DC | PRN
  Administered 2024-01-21: 40 mg via INTRAVENOUS
  Administered 2024-01-21: 60 mg via INTRAVENOUS

## 2024-01-21 MED ORDER — MENTHOL 3 MG MT LOZG: 1.0000 | LOZENGE | OROMUCOSAL | Status: DC | PRN

## 2024-01-21 MED ORDER — ONDANSETRON HCL 4 MG/2ML IJ SOLN: 4.0000 mg | Freq: Four times a day (QID) | INTRAMUSCULAR | Status: DC | PRN

## 2024-01-21 MED ORDER — DAPAGLIFLOZIN PROPANEDIOL 10 MG PO TABS
10.0000 mg | ORAL_TABLET | Freq: Every day | ORAL | Status: DC
  Administered 2024-01-21 – 2024-01-22 (×2): 10 mg via ORAL
  Filled 2024-01-21 (×2): qty 1

## 2024-01-21 MED ORDER — FENTANYL CITRATE (PF) 250 MCG/5ML IJ SOLN
INTRAMUSCULAR | Status: AC
Start: 2024-01-21 — End: ?
  Filled 2024-01-21: qty 5

## 2024-01-21 MED ORDER — ROCURONIUM BROMIDE 10 MG/ML (PF) SYRINGE
PREFILLED_SYRINGE | INTRAVENOUS | Status: DC | PRN
  Administered 2024-01-21: 50 mg via INTRAVENOUS
  Administered 2024-01-21: 10 mg via INTRAVENOUS

## 2024-01-21 MED ORDER — SENNA 8.6 MG PO TABS
1.0000 | ORAL_TABLET | Freq: Two times a day (BID) | ORAL | Status: DC | PRN
  Administered 2024-01-21: 8.6 mg via ORAL
  Filled 2024-01-21: qty 1

## 2024-01-21 MED ORDER — DEXAMETHASONE SODIUM PHOSPHATE 10 MG/ML IJ SOLN
INTRAMUSCULAR | Status: DC | PRN
  Administered 2024-01-21: 10 mg via INTRAVENOUS

## 2024-01-21 MED ORDER — ESTRADIOL 0.1 MG/GM VA CREA: 1.0000 | TOPICAL_CREAM | Freq: Every day | VAGINAL | Status: DC

## 2024-01-21 MED ORDER — THROMBIN (RECOMBINANT) 5000 UNITS EX SOLR
CUTANEOUS | Status: DC | PRN
  Administered 2024-01-21: 5 mL via TOPICAL

## 2024-01-21 MED ORDER — CHLORHEXIDINE GLUCONATE 0.12 % MT SOLN
OROMUCOSAL | Status: AC
  Administered 2024-01-21: 15 mL via OROMUCOSAL
  Filled 2024-01-21: qty 15

## 2024-01-21 MED ORDER — DOXYCYCLINE HYCLATE 100 MG PO TABS
100.0000 mg | ORAL_TABLET | Freq: Every day | ORAL | Status: DC
Start: 2024-01-23 — End: 2024-01-22

## 2024-01-21 MED ORDER — FUROSEMIDE 20 MG PO TABS
20.0000 mg | ORAL_TABLET | Freq: Every day | ORAL | Status: DC
  Filled 2024-01-21: qty 1

## 2024-01-21 MED ORDER — ONDANSETRON HCL 4 MG PO TABS: 4.0000 mg | ORAL_TABLET | Freq: Four times a day (QID) | ORAL | Status: DC | PRN

## 2024-01-21 MED ORDER — LACTATED RINGERS IV SOLN: INTRAVENOUS | Status: DC

## 2024-01-21 MED ORDER — ONDANSETRON HCL 4 MG/2ML IJ SOLN
INTRAMUSCULAR | Status: DC | PRN
  Administered 2024-01-21: 4 mg via INTRAVENOUS

## 2024-01-21 MED ORDER — LINACLOTIDE 72 MCG PO CAPS
72.0000 ug | ORAL_CAPSULE | Freq: Every day | ORAL | Status: DC
  Filled 2024-01-21: qty 1

## 2024-01-21 MED ORDER — CYCLOBENZAPRINE HCL 10 MG PO TABS: 10.0000 mg | ORAL_TABLET | Freq: Three times a day (TID) | ORAL | Status: DC | PRN

## 2024-01-21 MED ORDER — ROSUVASTATIN CALCIUM 20 MG PO TABS
20.0000 mg | ORAL_TABLET | Freq: Every day | ORAL | Status: DC
  Administered 2024-01-21 – 2024-01-22 (×2): 20 mg via ORAL
  Filled 2024-01-21 (×2): qty 1

## 2024-01-21 MED ORDER — VANCOMYCIN HCL IN DEXTROSE 1-5 GM/200ML-% IV SOLN
1000.0000 mg | INTRAVENOUS | Status: AC
  Administered 2024-01-21: 1000 mg via INTRAVENOUS
  Filled 2024-01-21: qty 200

## 2024-01-21 MED ORDER — HYDROCODONE-ACETAMINOPHEN 5-325 MG PO TABS: 1.0000 | ORAL_TABLET | ORAL | Status: DC | PRN

## 2024-01-21 MED ORDER — DILTIAZEM HCL ER COATED BEADS 240 MG PO CP24
240.0000 mg | ORAL_CAPSULE | Freq: Once | ORAL | Status: AC
  Administered 2024-01-21: 240 mg via ORAL
  Filled 2024-01-21: qty 1

## 2024-01-21 MED ORDER — FENTANYL CITRATE (PF) 100 MCG/2ML IJ SOLN
25.0000 ug | INTRAMUSCULAR | Status: DC | PRN
  Administered 2024-01-21 (×2): 50 ug via INTRAVENOUS

## 2024-01-21 MED ORDER — OXYCODONE HCL 5 MG PO TABS
5.0000 mg | ORAL_TABLET | Freq: Once | ORAL | Status: AC | PRN
  Administered 2024-01-21: 5 mg via ORAL

## 2024-01-21 MED ORDER — ACETAMINOPHEN 325 MG PO TABS: 650.0000 mg | ORAL_TABLET | ORAL | Status: DC | PRN

## 2024-01-21 MED ORDER — FENTANYL CITRATE (PF) 250 MCG/5ML IJ SOLN
INTRAMUSCULAR | Status: DC | PRN
  Administered 2024-01-21: 50 ug via INTRAVENOUS
  Administered 2024-01-21: 25 ug via INTRAVENOUS
  Administered 2024-01-21 (×2): 50 ug via INTRAVENOUS

## 2024-01-21 MED ORDER — 0.9 % SODIUM CHLORIDE (POUR BTL) OPTIME
TOPICAL | Status: DC | PRN
  Administered 2024-01-21: 1000 mL

## 2024-01-21 MED ORDER — SODIUM CHLORIDE 0.9 % IV SOLN
250.0000 mL | INTRAVENOUS | Status: DC
  Administered 2024-01-21: 250 mL via INTRAVENOUS

## 2024-01-21 MED ORDER — SODIUM CHLORIDE 0.9% FLUSH: 3.0000 mL | INTRAVENOUS | Status: DC | PRN

## 2024-01-21 MED ORDER — OXYCODONE HCL 5 MG/5ML PO SOLN: 5.0000 mg | Freq: Once | ORAL | Status: AC | PRN

## 2024-01-21 MED ORDER — PROPOFOL 10 MG/ML IV BOLUS
INTRAVENOUS | Status: DC | PRN
  Administered 2024-01-21: 120 mg via INTRAVENOUS

## 2024-01-21 SURGICAL SUPPLY — 48 items
BAG COUNTER SPONGE SURGICOUNT (BAG) ×2 IMPLANT
BAND RUBBER #18 3X1/16 STRL (MISCELLANEOUS) ×4 IMPLANT
BENZOIN TINCTURE PRP APPL 2/3 (GAUZE/BANDAGES/DRESSINGS) ×2 IMPLANT
BIT DRILL 13 (BIT) IMPLANT
BUR MATCHSTICK NEURO 3.0 LAGG (BURR) ×2 IMPLANT
CANISTER SUCT 3000ML PPV (MISCELLANEOUS) ×2 IMPLANT
DRAPE C-ARM 42X72 X-RAY (DRAPES) ×4 IMPLANT
DRAPE LAPAROTOMY 100X72 PEDS (DRAPES) ×2 IMPLANT
DRAPE MICROSCOPE SLANT 54X150 (MISCELLANEOUS) ×2 IMPLANT
DURAPREP 6ML APPLICATOR 50/CS (WOUND CARE) ×2 IMPLANT
ELECT COATED BLADE 2.86 ST (ELECTRODE) ×2 IMPLANT
ELECT REM PT RETURN 9FT ADLT (ELECTROSURGICAL) ×1 IMPLANT
ELECTRODE REM PT RTRN 9FT ADLT (ELECTROSURGICAL) ×2 IMPLANT
GAUZE 4X4 16PLY ~~LOC~~+RFID DBL (SPONGE) IMPLANT
GAUZE SPONGE 4X4 12PLY STRL (GAUZE/BANDAGES/DRESSINGS) ×2 IMPLANT
GAUZE SPONGE 4X4 12PLY STRL LF (GAUZE/BANDAGES/DRESSINGS) IMPLANT
GLOVE BIOGEL PI IND STRL 8.5 (GLOVE) IMPLANT
GLOVE ECLIPSE 9.0 STRL (GLOVE) ×2 IMPLANT
GLOVE EXAM NITRILE XL STR (GLOVE) IMPLANT
GLOVE SURG SS PI 8.5 STRL STRW (GLOVE) IMPLANT
GOWN STRL REUS W/ TWL LRG LVL3 (GOWN DISPOSABLE) IMPLANT
GOWN STRL REUS W/ TWL XL LVL3 (GOWN DISPOSABLE) IMPLANT
GOWN STRL REUS W/TWL 2XL LVL3 (GOWN DISPOSABLE) IMPLANT
HALTER HD/CHIN CERV TRACTION D (MISCELLANEOUS) ×2 IMPLANT
HEMOSTAT POWDER KIT SURGIFOAM (HEMOSTASIS) IMPLANT
KIT BASIN OR (CUSTOM PROCEDURE TRAY) ×2 IMPLANT
KIT TURNOVER KIT B (KITS) ×2 IMPLANT
NDL SPNL 20GX3.5 QUINCKE YW (NEEDLE) ×2 IMPLANT
NEEDLE SPNL 20GX3.5 QUINCKE YW (NEEDLE) ×1 IMPLANT
NS IRRIG 1000ML POUR BTL (IV SOLUTION) ×2 IMPLANT
PACK LAMINECTOMY NEURO (CUSTOM PROCEDURE TRAY) ×2 IMPLANT
PAD ARMBOARD POSITIONER FOAM (MISCELLANEOUS) ×6 IMPLANT
PLATE ELITE VISION 25MM (Plate) IMPLANT
RASP 3.0MM (RASP) IMPLANT
SCREW ST 13X4XST VA NS SPNE (Screw) IMPLANT
SPACER SPNL 11X14X6XPEEK CVD (Cage) IMPLANT
SPCR SPNL 11X14X6XPEEK CVD (Cage) ×1 IMPLANT
SPONGE INTESTINAL PEANUT (DISPOSABLE) ×2 IMPLANT
SPONGE SURGIFOAM ABS GEL SZ50 (HEMOSTASIS) ×2 IMPLANT
STRIP CLOSURE SKIN 1/2X4 (GAUZE/BANDAGES/DRESSINGS) ×2 IMPLANT
SUT VIC AB 3-0 SH 8-18 (SUTURE) ×2 IMPLANT
SUT VIC AB 4-0 RB1 18 (SUTURE) ×2 IMPLANT
TAPE CLOTH 4X10 WHT NS (GAUZE/BANDAGES/DRESSINGS) ×2 IMPLANT
TAPE CLOTH SURG 4X10 WHT LF (GAUZE/BANDAGES/DRESSINGS) IMPLANT
TOWEL GREEN STERILE (TOWEL DISPOSABLE) ×2 IMPLANT
TOWEL GREEN STERILE FF (TOWEL DISPOSABLE) ×2 IMPLANT
TRAP SPECIMEN MUCUS 40CC (MISCELLANEOUS) ×2 IMPLANT
WATER STERILE IRR 1000ML POUR (IV SOLUTION) ×2 IMPLANT

## 2024-01-21 NOTE — Anesthesia Procedure Notes (Signed)
 Procedure Name: Intubation Date/Time: 01/21/2024 11:41 AM  Performed by: Sharlene Dory, CRNAPre-anesthesia Checklist: Patient identified, Emergency Drugs available, Suction available and Patient being monitored Patient Re-evaluated:Patient Re-evaluated prior to induction Oxygen Delivery Method: Circle System Utilized Preoxygenation: Pre-oxygenation with 100% oxygen Induction Type: IV induction Ventilation: Mask ventilation without difficulty Laryngoscope Size: Glidescope and 3 Grade View: Grade I Tube type: Oral Tube size: 7.0 mm Number of attempts: 1 Airway Equipment and Method: Video-laryngoscopy and Rigid stylet Placement Confirmation: ETT inserted through vocal cords under direct vision, positive ETCO2 and breath sounds checked- equal and bilateral Secured at: 21 cm Tube secured with: Tape Dental Injury: Teeth and Oropharynx as per pre-operative assessment  Comments: Easy, atraumatic intubation. Eyes, nose, mouth, chin and ears free from external pressure. Lips, tongue, and teeth unchanged. ETT sliding in vocal cords opening without difficulty.

## 2024-01-21 NOTE — Op Note (Signed)
 Date of procedure: 01/21/2024  Date of dictation: Same  Service: Neurosurgery  Preoperative diagnosis: C2-3 herniated nucleus pulposus with myelopathy, status post C3-C7 anterior cervical discectomy and fusion with instrumentation  Postoperative diagnosis: Same  Procedure Name: C2-3 anterior cervical discectomy with interbody fusion utilizing interbody cage, low curves and autograft, and anterior plate instrumentation  Reexploration of C3-C7 anterior cervical fusion with removal of C3 hardware necessary for exposure of C2-3  Surgeon:Tarik Teixeira A.Donel Osowski, M.D.  Asst. Surgeon: Danielle Dess, MD; Doran Durand, NP  Anesthesia: General  Indication: 73 year old female remotely status post C3-C7 anterior cervical discectomy and fusion for treatment of compressive cervical myelopathy with good results presents now with worsening neck and left upper extremity symptoms.  Workup demonstrates evidence of a large paracentral disc herniation at C2-3 with marked cord compression.  Patient has solid fusion from C3-C7.  Plan for reexploration of her prior anterior cervical fusion at C3 with removal of hardware followed by C2-3 anterior cervical discectomy and fusion in hopes improving her symptoms.  Operative note: After induction of anesthesia, patient positioned supine with neck slightly extended and held placeholder traction.  Patient's anterior cervical region prepped and draped sterilely.  Incision made overlying C3.  Dissection performed on the right.  Retractor placed.  Fluoroscopy used.  Levels confirmed.  The C3-C7 anterior cervical plate encroached on the C2-3 disc space and prevented good access of the space.  Decision was made to cut the plate and remove the C3 segment.  Using a titanium metal cutting bit the pillars of the C3 plate were cut bilaterally.  The screws and the superior aspect the plate was removed.  Attention then placed to the C2-3 disc space.  Anterior osteophyte was removed.  The space was incised.   Discectomy performed using various instruments down the level of the posterior annulus.  Microscope then brought to the field used throughout the remainder of the discectomy remaining aspects of annulus and osteophytes were removed using high-speed drill down to level the posterior logical limb.  Posterior logical was then elevated and resected in a piecemeal fashion.  The posterior logical ligament was torn paracentrally on the left and a large amount of free disc herniation was dissected free and removed.  Underlying thecal sac was identified.  A wide central decompression then performed undercutting the bodies of C2 and C3.  Decompression then proceeded to each neural foramina.  Anterior foraminotomies performed on the course the exiting C3 nerve roots bilaterally.  At this point a very thorough discectomy been achieved.  There was no evidence of injury to the thecal sac and nerve roots.  Wound was then irrigated.  Gelfoam was placed topically for hemostasis then removed.  A 6 mm Medtronic anatomic peek cage was then packed with locally harvested autograft.  This is then impacted into place and recessed slightly from the anterior cortical margin at C2 and C3.  25 mm Atlantis anterior cervical height was then placed of the C2 and C3 levels.  This then attached under fluoroscopic guidance using 13 mm variable angle screws to each at both levels.  All 4 screws given final tightening found to be solid within bone.  Final images reveal good position cage and the hardware proper level with normal alignment of spine.  Wound was then irrigated.  Hemostasis was achieved with bipolar cautery.  Wounds then closed in layers with Vicryl sutures.  Steri-Strips and sterile dressing were applied.  No apparent complications.  Patient tolerated the procedure well and she returns to the recovery room  postop.

## 2024-01-21 NOTE — Plan of Care (Signed)

## 2024-01-21 NOTE — Transfer of Care (Signed)
 Immediate Anesthesia Transfer of Care Note  Patient: Samantha Stevenson  Procedure(s) Performed: ANTERIOR CERVICAL DECOMPRESSION/DISCECTOMY FUSION CERVICAL TWO-CERVICAL THREE  Patient Location: PACU  Anesthesia Type:General  Level of Consciousness: drowsy and patient cooperative  Airway & Oxygen Therapy: Patient Spontanous Breathing and Patient connected to face mask oxygen  Post-op Assessment: Report given to RN, Post -op Vital signs reviewed and stable, Patient moving all extremities, and Patient able to stick tongue midline  Post vital signs: Reviewed and stable  Last Vitals:  Vitals Value Taken Time  BP 140/80 01/21/24 1335  Temp    Pulse 75 01/21/24 1341  Resp 21 01/21/24 1341  SpO2 91 % 01/21/24 1341  Vitals shown include unfiled device data.  Last Pain:  Vitals:   01/21/24 0936  TempSrc:   PainSc: 5       Patients Stated Pain Goal: 0 (01/21/24 0936)  Complications: No notable events documented.

## 2024-01-21 NOTE — Brief Op Note (Signed)
 01/21/2024  1:21 PM  PATIENT:  Samantha Stevenson  73 y.o. female  PRE-OPERATIVE DIAGNOSIS:  Spinal stenosis cervical region  POST-OPERATIVE DIAGNOSIS:  Spinal stenosis cervical region  PROCEDURE:  Procedure(s) with comments: ANTERIOR CERVICAL DECOMPRESSION/DISCECTOMY FUSION CERVICAL TWO-CERVICAL THREE (N/A) - ACDF - C2-C3  SURGEON:  Surgeons and Role:    * Julio Sicks, MD - Primary    Barnett Abu, MD - Assisting  PHYSICIAN ASSISTANT:   ASSISTANTSMarland Mcalpine   ANESTHESIA:   general  EBL:  50cc   BLOOD ADMINISTERED:none  DRAINS: none   LOCAL MEDICATIONS USED:  NONE  SPECIMEN:  No Specimen  DISPOSITION OF SPECIMEN:  N/A  COUNTS:  YES  TOURNIQUET:  * No tourniquets in log *  DICTATION: .Dragon Dictation  PLAN OF CARE: Admit for overnight observation  PATIENT DISPOSITION:  PACU - hemodynamically stable.   Delay start of Pharmacological VTE agent (>24hrs) due to surgical blood loss or risk of bleeding: yes

## 2024-01-21 NOTE — H&P (Signed)
 Samantha Stevenson is an 73 y.o. female.   Chief Complaint: Neck pain HPI: 73 year old female remotely status post C3-C7 anterior cervical decompression and fusion for treatment of compressive cervical myelopathy.  Patient presents now with worsening neck pain with radiating pain and numbness into her left upper extremity which is failed conservative management.  Workup demonstrates evidence of stable and solid fusion from C3-C7.  She has worsening adjacent level disc degeneration with degenerative disc space collapse and degenerative retrolisthesis at C2-3.  Superimposed upon this the patient has a moderately large left paracentral disc herniation with significant cord compression.  The patient presents now for C2-3 anterior cervical decompression and fusion.  Past Medical History:  Diagnosis Date   Allergy    Anemia    Arthritis    "qwhere" (07/29/2018)   Atrial fibrillation (HCC)    CHF (congestive heart failure) (HCC)    Clotting disorder (HCC)    Degenerative arthritis of hip    s/p L THR 12/2012   Dyspnea    since surgery in August 2019- "when I get worked up and walk a short distance"   Dysrhythmia    History of blood transfusion 05/2018   "related to OR"   Hyperlipidemia    Hypertension    Intramural hematoma of thoracic aorta (HCC) 06/13/2018   Migraines    mIgraines- none since blood pressure and lipids are under control   Myocardial infarction (HCC) 2000   due to atrial fib   Neuromuscular disorder (HCC)    pinched nerve- left side of neck   Pre-diabetes    Pulmonary emboli (HCC) 12/2012 dx   post op (L THR), anticoag x 80mo    Past Surgical History:  Procedure Laterality Date   ABDOMINAL HYSTERECTOMY     "w/1 tube"   ANTERIOR CERVICAL DECOMPRESSION/DISCECTOMY FUSION 4 LEVELS N/A 02/02/2020   Procedure: CERVICAL THREE-FOUR, CERVICAL FOUR-FIVE, CERVICAL FIVE-SIX, CERVICAL SIX-SEVEN ANTERIOR CERVICAL DECOMPRESSION/DISCECTOMY FUSION;  Surgeon: Julio Sicks, MD;  Location:  [facility] OR;  Service: Neurosurgery;  Laterality: N/A;   ARTERY REPAIR Left 08/15/2018   Procedure: LEFT BRACHIAL ARTERY EXPLORATION;  Surgeon: Maeola Harman, MD;  Location: [facility] OR;  Service: Vascular;  Laterality: Left;   Breast Ultrasound Left 04/16/2013   Done @ breast center Impression: no malignancy appearance noted on the screen study is consistent with a summation shadow   BUNIONECTOMY Left    CARDIAC CATHETERIZATION     CAROTID-SUBCLAVIAN BYPASS GRAFT Left 06/13/2018   Procedure: BYPASS GRAFT CAROTID-SUBCLAVIAN;  Surgeon: Maeola Harman, MD;  Location: [facility] OR;  Service: Vascular;  Laterality: Left;   COLONOSCOPY     CYSTOSCOPY WITH RETROGRADE PYELOGRAM, URETEROSCOPY AND STENT PLACEMENT Bilateral 11/02/2020   Procedure: DIAGNOSTIC CYSTOSCOPY WITH RETROGRADE PYELOGRAM,BILATERAL DIAGNOSTIC URETEROSCOPY  AND BILATERAL STENT PLACEMENT;  Surgeon: Noel Christmas, MD;  Location: [facility];  Service: Urology;  Laterality: Bilateral;  90 MINS   DG TUMB RIGHT HAND Right    cyst removal   IR THORACENTESIS ASP PLEURAL SPACE W/IMG GUIDE  05/31/2018   JOINT REPLACEMENT     KNEE ARTHROSCOPY Left 12/16/2020   Procedure: LEFT KNEE ARTHROSCOPY WITH PARTIAL LATERAL MENISCECTOMY;  Surgeon: Kathryne Hitch, MD;  Location: [facility];  Service: Orthopedics;  Laterality: Left;   THORACIC AORTIC ENDOVASCULAR STENT GRAFT Left 06/13/2018   Procedure: LEFT  SUBCLAVIAN TO CAROTID ARTERY TRANSPOSITION; THORACIC AORTIC ENDOVASCULAR STENT GRAFT using GORE CONFORMABLE THORACIC STENT GRAFT AND GORE TAG THORACIC ENDOPROSTHESIS; STENT LEFT COMMON CAROTID ARTERY, RIGHT COMMON-FEMORAL  ENDARTERECTOMY WITH PATCH ANGIOPLASTY;  Surgeon: Maeola Harman, MD;  Location[facility] OR;  Service: Vascular;  Laterality: Left;   TONSILLECTOMY     TOTAL HIP ARTHROPLASTY Left 01/03/2013   Procedure: TOTAL HIP ARTHROPLASTY ANTERIOR APPROACH;  Surgeon: Kathryne Hitch,  MD;  Location: [facility] ORS;  Service: Orthopedics;  Laterality: Left;  Left Total Hip Arthroplasty, Anterior Approach   TUBAL LIGATION     VAGINA RECONSTRUCTION SURGERY  1966   vagina had closed up when 73 years old   WOUND EXPLORATION Left 06/16/2018   Procedure: LEFT NECK WOUND EXPLORATION, CHEST TUBE INSERTION;  Surgeon: Maeola Harman, MD;  Location: [facility] OR;  Service: Vascular;  Laterality: Left;    Family History  Problem Relation Age of Onset   Heart disease Father    Diabetes Mother    Heart disease Mother        pacemaker   Heart attack Mother 79   Diabetes Sister    Alcohol abuse Other    Heart disease Other    Hyperlipidemia Other    Hypertension Other    Diabetes Other    Breast cancer Other    Colon cancer Neg Hx    Esophageal cancer Neg Hx    Stomach cancer Neg Hx    Rectal cancer Neg Hx    Social History:  reports that she has never smoked. She has never used smokeless tobacco. She reports that she does not currently use alcohol. She reports that she does not use drugs.  Allergies:  Allergies  Allergen Reactions   Penicillins Swelling and Other (See Comments)    Severe swelling   Shellfish Allergy Anaphylaxis   Latex Hives and Rash   Banana Nausea Only   Lactose Intolerance (Gi) Diarrhea and Nausea And Vomiting   Oxycodone Other (See Comments)    Pt stated, "Made me feel really drowsy" Tolerating as of 02/16/20 after neck surgery   Asa [Aspirin] Nausea And Vomiting    Makes stomach upset Asprin 325   Celebrex [Celecoxib] Nausea And Vomiting    GI upset   Kiwi Extract Rash   Nsaids Rash    Facility-Administered Medications Prior to Admission  Medication Dose Route Frequency Provider Last Rate Last Admin   methylPREDNISolone acetate (DEPO-MEDROL) injection 80 mg  80 mg Intramuscular Once Richardean Sale, DO       Medications Prior to Admission  Medication Sig Dispense Refill   acetaminophen (TYLENOL) 500 MG tablet Take 1,000 mg by mouth every  8 (eight) hours as needed for moderate pain.     clindamycin (CLEOCIN T) 1 % lotion Apply 1 application. topically as needed.     dapagliflozin propanediol (FARXIGA) 10 MG TABS tablet Take 10 mg by mouth daily.     diltiazem (DILACOR XR) 240 MG 24 hr capsule Take 1 capsule (240 mg total) by mouth every morning. 90 capsule 3   doxycycline (VIBRAMYCIN) 100 MG capsule Take 100 mg by mouth daily.     ferrous sulfate 325 (65 FE) MG tablet Take 325 mg by mouth daily with breakfast.     furosemide (LASIX) 20 MG tablet Take 1 tablet (20 mg total) by mouth at bedtime. 90 tablet 3   LINZESS 72 MCG capsule TAKE 1 CAPSULE BY MOUTH DAILY BEFORE BREAKFAST 30 capsule 2   metroNIDAZOLE (METROCREAM) 0.75 % cream Apply 1 application. topically as needed.     mirabegron ER (MYRBETRIQ) 25 MG TB24 tablet Take 1 tablet (25 mg total) by mouth daily. 90 tablet  3   Multiple Vitamin (MULTIVITAMIN) tablet Take 2 tablets by mouth daily. Gummies     rosuvastatin (CRESTOR) 20 MG tablet TAKE 1 TABLET BY MOUTH EVERY DAY 90 tablet 3   aspirin EC 81 MG tablet Take 81 mg by mouth at bedtime.      No results found for this or any previous visit (from the past 48 hours). No results found.  Pertinent items noted in HPI and remainder of comprehensive ROS otherwise negative.  Blood pressure (!) 175/88, pulse 85, temperature 98 F (36.7 C), temperature source Oral, resp. rate 18, height 5\' 4"  (1.626 m), weight 76.2 kg, SpO2 97%.  Patient is awake and alert.  She is oriented and appropriate.  Speech is fluent.  Judgment insight are intact.  Cranial nerve function normal by.  Motor examination reveals mild weakness of grip strength left worse than right otherwise motor strength intact.  Sensory examination reveals decreased sensation pinprick and light touch in her left distal upper extremity.  Reflexes are brisk.  Gait is reasonably normal.  Examination head ears eyes nose and throat is unremarked.  Chest and abdomen are benign.   Extremities are free from injury deformity. Assessment/Plan C2-3 stenosis with myelopathy.  Plan C2-3 anterior cervical discectomy with interbody fusion utilizing interbody allograft and anterior plate instrumentation.  Risks and benefits been explained.  Patient wishes to proceed.  Kathaleen Maser Hanae Waiters 01/21/2024, 9:31 AM

## 2024-01-22 ENCOUNTER — Encounter (HOSPITAL_COMMUNITY): Payer: Self-pay | Admitting: Neurosurgery

## 2024-01-22 DIAGNOSIS — M4802 Spinal stenosis, cervical region: Secondary | ICD-10-CM | POA: Diagnosis not present

## 2024-01-22 DIAGNOSIS — I509 Heart failure, unspecified: Secondary | ICD-10-CM | POA: Diagnosis not present

## 2024-01-22 DIAGNOSIS — I110 Hypertensive heart disease with heart failure: Secondary | ICD-10-CM | POA: Diagnosis not present

## 2024-01-22 DIAGNOSIS — Z86711 Personal history of pulmonary embolism: Secondary | ICD-10-CM | POA: Diagnosis not present

## 2024-01-22 DIAGNOSIS — M5001 Cervical disc disorder with myelopathy,  high cervical region: Secondary | ICD-10-CM | POA: Diagnosis not present

## 2024-01-22 DIAGNOSIS — I4891 Unspecified atrial fibrillation: Secondary | ICD-10-CM | POA: Diagnosis not present

## 2024-01-22 MED ORDER — HYDROCODONE-ACETAMINOPHEN 5-325 MG PO TABS: 1.0000 | ORAL_TABLET | ORAL | 0 refills | Status: DC | PRN

## 2024-01-22 MED ORDER — CYCLOBENZAPRINE HCL 10 MG PO TABS: 10.0000 mg | ORAL_TABLET | Freq: Three times a day (TID) | ORAL | 0 refills | Status: AC | PRN

## 2024-01-22 MED FILL — Thrombin For Soln 5000 Unit: CUTANEOUS | Qty: 2 | Status: AC

## 2024-01-22 NOTE — Plan of Care (Signed)

## 2024-01-22 NOTE — Discharge Instructions (Addendum)
  Wound Care Leave steri-strips on neck.  They will fall off by themselves. Do not put any creams, lotions, or ointments on incision. You are fine to shower. Let water run over incision and pat dry.  Activity Walk each and every day, increasing distance each day. No lifting greater than 8 lbs.  Avoid excessive back motion. No driving, You can ride as a passenger locally  Diet Resume your normal diet.   Call Your Doctor If Any of These Occur Redness, drainage, or swelling at the wound.  Temperature greater than 101 degrees. Severe pain not relieved by pain medication. Incision starts to come apart.  Follow Up Appt Call [phone] if you have one or any problem.

## 2024-01-22 NOTE — Progress Notes (Signed)
Patient alert and oriented, mae's well, voiding adequate amount of urine, swallowing without difficulty, no c/o pain at time of discharge. Patient discharged home with family. Script and discharged instructions given to patient. Patient and family stated understanding of instructions given. Patient has an appointment with Dr. Pool  

## 2024-01-22 NOTE — Evaluation (Signed)
 Occupational Therapy Evaluation Patient Details Name: Samantha Stevenson MRN: [id number] DOB: Feb 25, 1951 Today's Date: 01/22/2024   History of Present Illness   73 yo F s/p ACDF.  PMH: prior ACDF, R THA, Arthritis, CHF, HTN, HLD.     Clinical Impressions Patient admitted for the procedure above.  Patient is essentially at her baseline for ADL completion and mobility.  Able to climb a set of stairs without assist.  No OT needs exist in the acute setting.  Deficit is a sore throat.  No post acute OT is needed.  Recommend follow up as prescribed by MD.       If plan is discharge home, recommend the following:   Assist for transportation     Functional Status Assessment   Patient has not had a recent decline in their functional status     Equipment Recommendations   None recommended by OT     Recommendations for Other Services         Precautions/Restrictions   Precautions Precautions: Cervical Precaution Booklet Issued: Yes (comment) Recall of Precautions/Restrictions: Intact Restrictions Weight Bearing Restrictions Per Provider Order: No     Mobility Bed Mobility Overal bed mobility: Modified Independent                  Transfers Overall transfer level: Modified independent Equipment used: None                      Balance Overall balance assessment: No apparent balance deficits (not formally assessed)                                         ADL either performed or assessed with clinical judgement   ADL Overall ADL's : At baseline                                             Vision Patient Visual Report: No change from baseline       Perception Perception: Not tested       Praxis Praxis: Not tested       Pertinent Vitals/Pain Pain Assessment Pain Assessment: Faces Faces Pain Scale: Hurts even more Pain Location: Throat Pain Descriptors / Indicators: Sore     Extremity/Trunk  Assessment Upper Extremity Assessment Upper Extremity Assessment: Overall WFL for tasks assessed   Lower Extremity Assessment Lower Extremity Assessment: Overall WFL for tasks assessed   Cervical / Trunk Assessment Cervical / Trunk Assessment: Neck Surgery   Communication Communication Communication: No apparent difficulties   Cognition Arousal: Alert Behavior During Therapy: WFL for tasks assessed/performed Cognition: No apparent impairments                               Following commands: Intact       Cueing  General Comments   Cueing Techniques: Verbal cues   VSS on RA   Exercises     Shoulder Instructions      Home Living Family/patient expects to be discharged to:: Private residence Living Arrangements: Children Available Help at Discharge: Family;Available 24 hours/day Type of Home: House Home Access: Stairs to enter [employer] of Steps: 6   Home Layout: Multi-level;Laundry or work area in basement;Bed/bath upstairs;Able to live on  main level with bedroom/bathroom     Bathroom Shower/Tub: Walk-in shower;Tub/shower unit   Bathroom Toilet: Handicapped height Bathroom Accessibility: Yes   Home Equipment: Agricultural consultant (2 wheels)          Prior Functioning/Environment Prior Level of Function : Independent/Modified Independent;Driving                    OT Problem List: Pain   OT Treatment/Interventions:        OT Goals(Current goals can be found in the care plan section)   Acute Rehab OT Goals Patient Stated Goal: Return home OT Goal Formulation: With patient Time For Goal Achievement: 01/25/24 Potential to Achieve Goals: Good   OT Frequency:       Co-evaluation              AM-PAC OT "6 Clicks" Daily Activity     Outcome Measure Help from another person eating meals?: None Help from another person taking care of personal grooming?: None Help from another person toileting, which includes using  toliet, bedpan, or urinal?: None Help from another person bathing (including washing, rinsing, drying)?: None Help from another person to put on and taking off regular upper body clothing?: None Help from another person to put on and taking off regular lower body clothing?: None 6 Click Score: 24   End of Session Nurse Communication: Mobility status  Activity Tolerance: Patient tolerated treatment well Patient left: in bed;with call bell/phone within reach  OT Visit Diagnosis: Muscle weakness (generalized) (M62.81)                Time: 1610-9604 OT Time Calculation (min): 17 min Charges:  OT General Charges $OT Visit: 1 Visit OT Evaluation $OT Eval Moderate Complexity: 1 Mod  01/22/2024  RP, OTR/L  Acute Rehabilitation Services  Office:  [phone]   Samantha Stevenson 01/22/2024, 9:26 AM

## 2024-01-22 NOTE — Discharge Summary (Signed)
 Physician Discharge Summary  Patient ID: Samantha Stevenson MRN: [id number] DOB/AGE: [birth year] 73 y.o.  Admit date: 01/21/2024 Discharge date: 01/22/2024  Admission Diagnoses:  Discharge Diagnoses:  Principal Problem:   Stenosis of cervical spine with myelopathy [facility])   Discharged Condition: good  Hospital Course: Patient mid to hospital where she underwent uncomplicated C2-3 anterior cervical decompression and fusion for treatment of her compressive cervical myelopathy.  Postop she is doing well.  Preoperative neck and upper extremity symptoms much improved.  Standing ambulating and voiding without difficulty.  Voice strong.  Swallowing reasonably well.  Ready for discharge home.  Consults:   Significant Diagnostic Studies:   Treatments:   Discharge Exam: Blood pressure 139/69, pulse 75, temperature 99.1 F (37.3 C), temperature source Oral, resp. rate 19, height 5\' 4"  (1.626 m), weight 76.2 kg, SpO2 98%. Awake and alert.  Oriented and appropriate.  Motor and sensory function intact.  Wound clean and dry.  Neck soft.  Chest and abdomen benign.  Disposition: Discharge disposition: 01-Home or Self Care        Allergies as of 01/22/2024       Reactions   Penicillins Swelling, Other (See Comments)   Severe swelling   Shellfish Allergy Anaphylaxis   Latex Hives, Rash   Banana Nausea Only   Lactose Intolerance (gi) Diarrhea, Nausea And Vomiting   Oxycodone Other (See Comments)   Pt stated, "Made me feel really drowsy" Tolerating as of 02/16/20 after neck surgery   Asa [aspirin] Nausea And Vomiting   Makes stomach upset Asprin 325   Celebrex [celecoxib] Nausea And Vomiting   GI upset   Kiwi Extract Rash   Nsaids Rash        Medication List     TAKE these medications    acetaminophen 500 MG tablet Commonly known as: TYLENOL Take 1,000 mg by mouth every 8 (eight) hours as needed for moderate pain.   aspirin EC 81 MG tablet Take 81 mg by mouth daily.    clindamycin 1 % lotion Commonly known as: CLEOCIN T Apply 1 application  topically as needed (acne).   cyclobenzaprine 10 MG tablet Commonly known as: FLEXERIL Take 1 tablet (10 mg total) by mouth 3 (three) times daily as needed for muscle spasms.   diltiazem 240 MG 24 hr capsule Commonly known as: DILACOR XR Take 1 capsule (240 mg total) by mouth every morning.   doxycycline 100 MG capsule Commonly known as: VIBRAMYCIN Take 100 mg by mouth daily.   estradiol 0.1 MG/GM vaginal cream Commonly known as: ESTRACE Place 1 Applicatorful vaginally at bedtime.   Farxiga 10 MG Tabs tablet Generic drug: dapagliflozin propanediol Take 10 mg by mouth daily.   ferrous sulfate 325 (65 FE) MG tablet Take 325 mg by mouth once a week.   furosemide 20 MG tablet Commonly known as: LASIX Take 1 tablet (20 mg total) by mouth at bedtime.   HYDROcodone-acetaminophen 5-325 MG tablet Commonly known as: NORCO/VICODIN Take 1 tablet by mouth every 4 (four) hours as needed for moderate pain (pain score 4-6) ((score 4 to 6)).   Linzess 72 MCG capsule Generic drug: linaclotide TAKE 1 CAPSULE BY MOUTH DAILY BEFORE BREAKFAST   metroNIDAZOLE 0.75 % cream Commonly known as: METROCREAM Apply 1 application  topically as needed (acne).   mirabegron ER 25 MG Tb24 tablet Commonly known as: MYRBETRIQ Take 1 tablet (25 mg total) by mouth daily.   multivitamin tablet Take 2 tablets by mouth daily. Gummies   rosuvastatin 20 MG tablet  Commonly known as: CRESTOR TAKE 1 TABLET BY MOUTH EVERY DAY         Signed: Temple Pacini 01/22/2024, 9:58 AM

## 2024-01-23 NOTE — Anesthesia Postprocedure Evaluation (Signed)
 Anesthesia Post Note  Patient: Samantha Stevenson  Procedure(s) Performed: ANTERIOR CERVICAL DECOMPRESSION/DISCECTOMY FUSION CERVICAL TWO-CERVICAL THREE     Patient location during evaluation: PACU Anesthesia Type: General Level of consciousness: awake and alert Pain management: pain level controlled Vital Signs Assessment: post-procedure vital signs reviewed and stable Respiratory status: spontaneous breathing, nonlabored ventilation, respiratory function stable and patient connected to nasal cannula oxygen Cardiovascular status: blood pressure returned to baseline and stable Postop Assessment: no apparent nausea or vomiting Anesthetic complications: no  No notable events documented.  Last Vitals:  Vitals:   01/22/24 0437 01/22/24 0713  BP: 134/72 139/69  Pulse: 82 75  Resp: 20 19  Temp: 37.3 C 37.3 C  SpO2: 98% 98%    Last Pain:  Vitals:   01/22/24 0713  TempSrc: Oral  PainSc:    Pain Goal: Patients Stated Pain Goal: 3 (01/21/24 2059)                 Levi Klaiber L Ivorie Uplinger

## 2024-01-24 DIAGNOSIS — H538 Other visual disturbances: Secondary | ICD-10-CM | POA: Diagnosis not present

## 2024-01-28 ENCOUNTER — Ambulatory Visit (INDEPENDENT_AMBULATORY_CARE_PROVIDER_SITE_OTHER): Payer: Medicare Other

## 2024-01-28 VITALS — BP 120/60 | HR 81 | Temp 98.7°F | Ht 64.0 in | Wt 166.9 lb

## 2024-01-28 DIAGNOSIS — Z0000 Encounter for general adult medical examination without abnormal findings: Secondary | ICD-10-CM

## 2024-01-28 NOTE — Patient Instructions (Addendum)
 Ms. Dowty , Thank you for taking time to come for your Medicare Wellness Visit. I appreciate your ongoing commitment to your health goals. Please review the following plan we discussed and let me know if I can assist you in the future.   Referrals/Orders/Follow-Ups/Clinician Recommendations:   This is a list of the screening recommended for you and due dates:  Health Maintenance  Topic Date Due   Zoster (Shingles) Vaccine (1 of 2) Never done   COVID-19 Vaccine (5 - 2024-25 season) 06/24/2023   Flu Shot  05/23/2024   Medicare Annual Wellness Visit  01/27/2025   Mammogram  09/13/2025   Colon Cancer Screening  09/15/2026   DTaP/Tdap/Td vaccine (3 - Td or Tdap) 10/06/2032   Pneumonia Vaccine  Completed   DEXA scan (bone density measurement)  Completed   Hepatitis C Screening  Completed   HPV Vaccine  Aged Out    Advanced directives: (Declined) Advance directive discussed with you today. Even though you declined this today, please call our office should you change your mind, and we can give you the proper paperwork for you to fill out.  Next Medicare Annual Wellness Visit scheduled for next year: Yes

## 2024-01-28 NOTE — Progress Notes (Signed)
 Subjective:   Samantha Stevenson is a 73 y.o. who presents for a Medicare Wellness preventive visit.  Visit Complete: In person    Persons Participating in Visit: Patient.  AWV Questionnaire: No: Patient Medicare AWV questionnaire was not completed prior to this visit.  Cardiac Risk Factors include: advanced age (>74men, >17 women);hypertension     Objective:    Today's Vitals   01/28/24 1543 01/28/24 1553  BP: 120/60   Pulse: 81   Temp: 98.7 F (37.1 C)   TempSrc: Oral   SpO2: 96%   Weight: 166 lb 14.4 oz (75.7 kg)   Height: 5\' 4"  (1.626 m)   PainSc:  0-No pain   Body mass index is 28.65 kg/m.     01/28/2024    4:07 PM 01/21/2024    9:22 AM 01/02/2023    2:08 PM 07/21/2021   10:38 AM 03/10/2021    5:57 PM 12/16/2020    6:31 AM 12/08/2020    1:49 PM  Advanced Directives  Does Patient Have a Medical Advance Directive? No No No Yes No No No  Does patient want to make changes to medical advance directive?    No - Patient declined     Would patient like information on creating a medical advance directive? No - Patient declined  No - Patient declined  No - Patient declined No - Patient declined No - Patient declined    Current Medications (verified) Outpatient Encounter Medications as of 01/28/2024  Medication Sig   acetaminophen (TYLENOL) 500 MG tablet Take 1,000 mg by mouth every 8 (eight) hours as needed for moderate pain.   aspirin EC 81 MG tablet Take 81 mg by mouth daily.   clindamycin (CLEOCIN T) 1 % lotion Apply 1 application  topically as needed (acne).   cyclobenzaprine (FLEXERIL) 10 MG tablet Take 1 tablet (10 mg total) by mouth 3 (three) times daily as needed for muscle spasms. (Patient not taking: Reported on 01/28/2024)   dapagliflozin propanediol (FARXIGA) 10 MG TABS tablet Take 10 mg by mouth daily.   diltiazem (DILACOR XR) 240 MG 24 hr capsule Take 1 capsule (240 mg total) by mouth every morning.   doxycycline (VIBRAMYCIN) 100 MG capsule Take 100 mg by mouth  daily. (Patient not taking: Reported on 01/28/2024)   estradiol (ESTRACE) 0.1 MG/GM vaginal cream Place 1 Applicatorful vaginally at bedtime. (Patient not taking: Reported on 01/21/2024)   ferrous sulfate 325 (65 FE) MG tablet Take 325 mg by mouth once a week.   furosemide (LASIX) 20 MG tablet Take 1 tablet (20 mg total) by mouth at bedtime.   HYDROcodone-acetaminophen (NORCO/VICODIN) 5-325 MG tablet Take 1 tablet by mouth every 4 (four) hours as needed for moderate pain (pain score 4-6) ((score 4 to 6)).   LINZESS 72 MCG capsule TAKE 1 CAPSULE BY MOUTH DAILY BEFORE BREAKFAST   metroNIDAZOLE (METROCREAM) 0.75 % cream Apply 1 application  topically as needed (acne).   mirabegron ER (MYRBETRIQ) 25 MG TB24 tablet Take 1 tablet (25 mg total) by mouth daily.   Multiple Vitamin (MULTIVITAMIN) tablet Take 2 tablets by mouth daily. Gummies   rosuvastatin (CRESTOR) 20 MG tablet TAKE 1 TABLET BY MOUTH EVERY DAY   Facility-Administered Encounter Medications as of 01/28/2024  Medication   methylPREDNISolone acetate (DEPO-MEDROL) injection 80 mg    Allergies (verified) Penicillins, Shellfish allergy, Latex, Banana, Lactose intolerance (gi), Oxycodone, Asa [aspirin], Celebrex [celecoxib], Kiwi extract, and Nsaids   History: Past Medical History:  Diagnosis Date   Allergy  Anemia    Arthritis    "qwhere" (07/29/2018)   Atrial fibrillation (HCC)    CHF (congestive heart failure) (HCC)    Clotting disorder (HCC)    Degenerative arthritis of hip    s/p L THR 12/2012   Dyspnea    since surgery in August 2019- "when I get worked up and walk a short distance"   Dysrhythmia    History of blood transfusion 05/2018   "related to OR"   Hyperlipidemia    Hypertension    Intramural hematoma of thoracic aorta (HCC) 06/13/2018   Migraines    mIgraines- none since blood pressure and lipids are under control   Myocardial infarction (HCC) 2000   due to atrial fib   Neuromuscular disorder (HCC)    pinched  nerve- left side of neck   Pre-diabetes    Pulmonary emboli (HCC) 12/2012 dx   post op (L THR), anticoag x 82mo   Past Surgical History:  Procedure Laterality Date   ABDOMINAL HYSTERECTOMY     "w/1 tube"   ANTERIOR CERVICAL DECOMP/DISCECTOMY FUSION N/A 01/21/2024   Procedure: ANTERIOR CERVICAL DECOMPRESSION/DISCECTOMY FUSION CERVICAL TWO-CERVICAL THREE;  Surgeon: Julio Sicks, MD;  Location[facility] OR;  Service: Neurosurgery;  Laterality: N/A;  ACDF - C2-C3   ANTERIOR CERVICAL DECOMPRESSION/DISCECTOMY FUSION 4 LEVELS N/A 02/02/2020   Procedure: CERVICAL THREE-FOUR, CERVICAL FOUR-FIVE, CERVICAL FIVE-SIX, CERVICAL SIX-SEVEN ANTERIOR CERVICAL DECOMPRESSION/DISCECTOMY FUSION;  Surgeon: Julio Sicks, MD;  Location: [facility] OR;  Service: Neurosurgery;  Laterality: N/A;   ARTERY REPAIR Left 08/15/2018   Procedure: LEFT BRACHIAL ARTERY EXPLORATION;  Surgeon: Maeola Harman, MD;  Location[facility] OR;  Service: Vascular;  Laterality: Left;   Breast Ultrasound Left 04/16/2013   Done @ breast center Impression: no malignancy appearance noted on the screen study is consistent with a summation shadow   BUNIONECTOMY Left    CARDIAC CATHETERIZATION     CAROTID-SUBCLAVIAN BYPASS GRAFT Left 06/13/2018   Procedure: BYPASS GRAFT CAROTID-SUBCLAVIAN;  Surgeon: Maeola Harman, MD;  Location: [facility] OR;  Service: Vascular;  Laterality: Left;   COLONOSCOPY     CYSTOSCOPY WITH RETROGRADE PYELOGRAM, URETEROSCOPY AND STENT PLACEMENT Bilateral 11/02/2020   Procedure: DIAGNOSTIC CYSTOSCOPY WITH RETROGRADE PYELOGRAM,BILATERAL DIAGNOSTIC URETEROSCOPY  AND BILATERAL STENT PLACEMENT;  Surgeon: Noel Christmas, MD;  Location: [facility] [place];  Service: Urology;  Laterality: Bilateral;  90 MINS   DG TUMB RIGHT HAND Right    cyst removal   IR THORACENTESIS ASP PLEURAL SPACE W/IMG GUIDE  05/31/2018   JOINT REPLACEMENT     KNEE ARTHROSCOPY Left 12/16/2020   Procedure: LEFT KNEE ARTHROSCOPY WITH PARTIAL LATERAL  MENISCECTOMY;  Surgeon: Kathryne Hitch, MD;  Location: [facility];  Service: Orthopedics;  Laterality: Left;   THORACIC AORTIC ENDOVASCULAR STENT GRAFT Left 06/13/2018   Procedure: LEFT  SUBCLAVIAN TO CAROTID ARTERY TRANSPOSITION; THORACIC AORTIC ENDOVASCULAR STENT GRAFT using GORE CONFORMABLE THORACIC STENT GRAFT AND GORE TAG THORACIC ENDOPROSTHESIS; STENT LEFT COMMON CAROTID ARTERY, RIGHT COMMON-FEMORAL ENDARTERECTOMY WITH PATCH ANGIOPLASTY;  Surgeon: Maeola Harman, MD;  Location: [facility] OR;  Service: Vascular;  Laterality: Left;   TONSILLECTOMY     TOTAL HIP ARTHROPLASTY Left 01/03/2013   Procedure: TOTAL HIP ARTHROPLASTY ANTERIOR APPROACH;  Surgeon: Kathryne Hitch, MD;  Location: [facility] ORS;  Service: Orthopedics;  Laterality: Left;  Left Total Hip Arthroplasty, Anterior Approach   TUBAL LIGATION     VAGINA RECONSTRUCTION SURGERY  1966   vagina had closed up when 73 years old   WOUND  EXPLORATION Left 06/16/2018   Procedure: LEFT NECK WOUND EXPLORATION, CHEST TUBE INSERTION;  Surgeon: Maeola Harman, MD;  Location: [facility] OR;  Service: Vascular;  Laterality: Left;   Family History  Problem Relation Age of Onset   Heart disease Father    Diabetes Mother    Heart disease Mother        pacemaker   Heart attack Mother 26   Diabetes Sister    Alcohol abuse Other    Heart disease Other    Hyperlipidemia Other    Hypertension Other    Diabetes Other    Breast cancer Other    Colon cancer Neg Hx    Esophageal cancer Neg Hx    Stomach cancer Neg Hx    Rectal cancer Neg Hx    Social History   Socioeconomic History   Marital status: Single    Spouse name: Not on file   Number of children: 2   Years of education: Not on file   Highest education level: Associate degree: occupational, Scientist, product/process development, or vocational program  Occupational History   Occupation: retired  Tobacco Use   Smoking status: Never   Smokeless tobacco: Never  Vaping Use    Vaping status: Never Used  Substance and Sexual Activity   Alcohol use: Not Currently   Drug use: Never   Sexual activity: Not Currently  Other Topics Concern   Not on file  Social History Narrative   Not on file   Social Drivers of Health   Financial Resource Strain: Low Risk  (01/28/2024)   Overall Financial Resource Strain (CARDIA)    Difficulty of Paying Living Expenses: Not hard at all  Recent Concern: Financial Resource Strain - Medium Risk (11/11/2023)   Overall Financial Resource Strain (CARDIA)    Difficulty of Paying Living Expenses: Somewhat hard  Food Insecurity: No Food Insecurity (01/28/2024)   Hunger Vital Sign    Worried About Running Out of Food in the Last Year: Never true    Ran Out of Food in the Last Year: Never true  Recent Concern: Food Insecurity - Food Insecurity Present (11/11/2023)   Hunger Vital Sign    Worried About Running Out of Food in the Last Year: Often true    Ran Out of Food in the Last Year: Often true  Transportation Needs: No Transportation Needs (01/28/2024)   PRAPARE - Administrator, Civil Service (Medical): No    Lack of Transportation (Non-Medical): No  Physical Activity: Sufficiently Active (01/28/2024)   Exercise Vital Sign    Days of Exercise per Week: 3 days    Minutes of Exercise per Session: 60 min  Recent Concern: Physical Activity - Insufficiently Active (11/11/2023)   Exercise Vital Sign    Days of Exercise per Week: 3 days    Minutes of Exercise per Session: 30 min  Stress: No Stress Concern Present (01/28/2024)   [employer] of Occupational Health - Occupational Stress Questionnaire    Feeling of Stress : Not at all  Social Connections: Moderately Integrated (01/28/2024)   Social Connection and Isolation Panel [NHANES]    Frequency of Communication with Friends and Family: More than three times a week    Frequency of Social Gatherings with Friends and Family: More than three times a week    Attends Religious  Services: More than 4 times per year    Active Member of [employer] or Organizations: Yes    Attends Banker Meetings: More than 4 times per  year    Marital Status: Divorced    Tobacco Counseling Counseling given: Not Answered    Clinical Intake:  Pre-visit preparation completed: No  Pain : No/denies pain Pain Score: 0-No pain     BMI - recorded: 28.65 Nutritional Status: BMI 25 -29 Overweight Nutritional Risks: None Diabetes: No  Lab Results  Component Value Date   HGBA1C 5.7 (A) 01/03/2024   HGBA1C 6.2 06/19/2016   HGBA1C 6.1 06/23/2015     How often do you need to have someone help you when you read instructions, pamphlets, or other written materials from your doctor or pharmacy?: 1 - Never  Interpreter Needed?: No  Information entered by :: Theresa Mulligan LPN   Activities of Daily Living     01/28/2024    4:05 PM 01/21/2024    9:40 AM  In your present state of health, do you have any difficulty performing the following activities:  Hearing? 0   Vision? 0   Difficulty concentrating or making decisions? 0   Walking or climbing stairs? 0   Dressing or bathing? 0   Doing errands, shopping? 0 0  Preparing Food and eating ? N   Using the Toilet? N   In the past six months, have you accidently leaked urine? N   Do you have problems with loss of bowel control? N   Managing your Medications? N   Managing your Finances? N   Housekeeping or managing your Housekeeping? N     Patient Care Team: Deeann Saint, MD as PCP - General (Family Medicine) Regan Lemming, MD as PCP - Cardiology (Cardiology) Kathryne Hitch, MD (Orthopedic Surgery) Nyoka Cowden, MD (Pulmonary Disease) Louis Meckel, MD (Inactive) (Gastroenterology) Julio Sicks, MD as Consulting Physician (Neurosurgery) Illene Labrador, OD (Optometry) Myrtie Neither Andreas Blower, MD as Consulting Physician (Gastroenterology) Althea Charon, MD (Radiology) Charlsie Merles Kirstie Peri, DPM  as Consulting Physician (Podiatry) [facility] as Consulting Physician (Ophthalmology) Illene Labrador, OD as Consulting Physician (Optometry)  Indicate any recent Medical Services you may have received from other than [facility] providers in the past year (date may be approximate).     Assessment:   This is a routine wellness examination for Samantha Stevenson.  Hearing/Vision screen Hearing Screening - Comments:: Denies hearing difficulties   Vision Screening - Comments:: Wears rx glasses - up to date with routine eye exams with  [facility]   Goals Addressed               This Visit's Progress     Lose weight (pt-stated)         Depression Screen     01/28/2024    4:05 PM 01/28/2024    4:04 PM 01/03/2024   11:25 AM 11/15/2023    9:17 AM 05/25/2023    8:55 AM 01/02/2023    2:10 PM 04/17/2022   10:28 AM  PHQ 2/9 Scores  PHQ - 2 Score 0 0 0 0 0 0 0  PHQ- 9 Score 0 0 0 3 0 0 0    Fall Risk     01/28/2024    4:06 PM 01/03/2024   11:25 AM 11/15/2023    9:17 AM 05/25/2023    8:55 AM 01/02/2023    2:09 PM  Fall Risk   Falls in the past year? 0 0 0 0 0  Number falls in past yr: 0 0 0 0 0  Injury with Fall? 0 0 0 0 0  Risk for fall due  to : No Fall Risks No Fall Risks No Fall Risks No Fall Risks No Fall Risks  Follow up Falls prevention discussed;Falls evaluation completed Falls evaluation completed Falls evaluation completed Falls evaluation completed Falls prevention discussed    MEDICARE RISK AT HOME:  Medicare Risk at Home Any stairs in or around the home?: Yes If so, are there any without handrails?: No Home free of loose throw rugs in walkways, pet beds, electrical cords, etc?: Yes Adequate lighting in your home to reduce risk of falls?: Yes Life alert?: No Use of a cane, walker or w/c?: No Grab bars in the bathroom?: No Shower chair or bench in shower?: No Elevated toilet seat or a handicapped toilet?: No  TIMED UP AND GO:  Was the test performed?  Yes  Length of  time to ambulate 10 feet: 10 sec Gait steady and fast without use of assistive device  Cognitive Function: 6CIT completed        01/28/2024    4:09 PM 01/02/2023    2:10 PM 04/12/2020   10:07 AM  6CIT Screen  What Year? 0 points 0 points 0 points  What month? 0 points 0 points 0 points  What time? 0 points 0 points 0 points  Count back from 20 0 points 0 points 0 points  Months in reverse 0 points 0 points 0 points  Repeat phrase 0 points 0 points 0 points  Total Score 0 points 0 points 0 points    Immunizations Immunization History  Administered Date(s) Administered   Fluad Quad(high Dose 65+) 07/02/2019, 07/22/2020, 07/21/2021   Influenza Whole 07/23/2012   Influenza, High Dose Seasonal PF 07/04/2018   Influenza,inj,Quad PF,6+ Mos 07/16/2013, 06/19/2016   Influenza-Unspecified 01/05/2023, 01/07/2023   Moderna Covid-19 Vaccine Bivalent Booster 5yrs & up 10/13/2021   Moderna Sars-Covid-2 Vaccination 05/10/2021   PFIZER(Purple Top)SARS-COV-2 Vaccination 06/12/2020, 07/03/2020, 05/10/2021   Pneumococcal Conjugate-13 01/03/2018   Pneumococcal Polysaccharide-23 04/08/2019   RSV,unspecified 10/06/2022   Rsv, Bivalent, Protein Subunit Rsvpref,pf (Abrysvo) 10/06/2022   Td 10/24/2011   Tdap 10/06/2022    Screening Tests Health Maintenance  Topic Date Due   Zoster Vaccines- Shingrix (1 of 2) Never done   COVID-19 Vaccine (5 - 2024-25 season) 06/24/2023   INFLUENZA VACCINE  05/23/2024   Medicare Annual Wellness (AWV)  01/27/2025   MAMMOGRAM  09/13/2025   Colonoscopy  09/15/2026   DTaP/Tdap/Td (3 - Td or Tdap) 10/06/2032   Pneumonia Vaccine 63+ Years old  Completed   DEXA SCAN  Completed   Hepatitis C Screening  Completed   HPV VACCINES  Aged Out    Health Maintenance  Health Maintenance Due  Topic Date Due   Zoster Vaccines- Shingrix (1 of 2) Never done   COVID-19 Vaccine (5 - 2024-25 season) 06/24/2023   Health Maintenance Items Addressed:    Additional  Screening:  Vision Screening: Recommended annual ophthalmology exams for early detection of glaucoma and other disorders of the eye.  Dental Screening: Recommended annual dental exams for proper oral hygiene  Community Resource Referral / Chronic Care Management: CRR required this visit?  No   CCM required this visit?  No     Plan:     I have personally reviewed and noted the following in the patient's chart:   Medical and social history Use of alcohol, tobacco or illicit drugs  Current medications and supplements including opioid prescriptions. Patient is not currently taking opioid prescriptions. Functional ability and status Nutritional status Physical activity Advanced directives List of  other physicians Hospitalizations, surgeries, and ER visits in previous 12 months Vitals Screenings to include cognitive, depression, and falls Referrals and appointments  In addition, I have reviewed and discussed with patient certain preventive protocols, quality metrics, and best practice recommendations. A written personalized care plan for preventive services as well as general preventive health recommendations were provided to patient.     Tillie Rung, LPN   10/28/1094   After Visit Summary: (In Person-Printed) AVS printed and given to the patient  Notes: Nothing significant to report at this time.

## 2024-02-11 DIAGNOSIS — H26493 Other secondary cataract, bilateral: Secondary | ICD-10-CM | POA: Diagnosis not present

## 2024-02-19 NOTE — Progress Notes (Unsigned)
 Cardiology Office Note Date:  02/19/2024  Patient ID:  Samantha Stevenson, Samantha Stevenson 01-Jul-1951, MRN [id number] PCP:  Viola Greulich, MD  Electrophysiologist:  Dr. Lawana Pray   Chief Complaint:     6 mo  History of Present Illness: Samantha Stevenson is a 73 y.o. female with history of  HTN, HLD, PE post hip replacement 2014 ,  PAFib (?) Intramural hematoma of the thoracic aorta extending from the left subclavian artery to the renal artery origins (details noted below).   She saw Dr. Alessandra Ancona 2017, at that time was referred to him for her Afib.  He mentions though having no records to review that actually showed her AFib, and that attempts to get them would be made, that she may benefit from a/c in the future if she does have AF.  She was c/o of some SOB and planned for an echo.  Echo then noted LVEF 55%, no WMA, no significant VHD, findings of elevated LVEDP and LA filling pressure.   August 2019, she developed CP, went to [facility], given h/o PE had CT chest done, noting  an acute intramural hematoma of the thoracic aorta extending from the left subclavian artery to the renal artery origins, her BP brought under strict control and her xarelto  reversed  CTS and VVS evaluated noted with type B aortic intramural hematoma  06/13/2018 1.  Left subclavian to carotid artery transposition 2.  Percutaneous access right common femoral artery 3.  Thoracic endovascular endo-grafting with left subclavian artery coverage with 28 x 28 x 15 cm distal and 34 x 34 x 20 cm proximal Gore CTAG 4.  Left common carotid artery stent with 7 x 40 mm Innova distal an 8 x 40 mm Innova proximally 5.  Right common femoral endarterectomy with greater saphenous vein patch angioplasty 6.  Intravascular ultrasound thoracic aorta.  developed tachycardia/hypotension found with lymphatic leak  06/16/2018 1.  Exploration left neck wound placement of 10 flat drain 2.  Left sided 28 French chest tube placement  TTE done during  this stay noted LVEF 55-60%, no WMA, grade I DD, There was evidence of aortic dissection flap noted   in the descending thoracic aorta (0.37cm flap posterior). Seen on   CT scan. No significant VHD.  She saw Dr. Vikki Graves 05/16/2019 doing well from her surgical perspective.  There was mention of some hematuria that she had apparently had in thepast as well.  He mentioned to discuss wether she needed the Xarelto  (noting on it for DVT/PE history , no mention of Afib hx), and asked the pt to f/u with her PMD, but did say she needed lifelong ASA therapy for her stents.  She was stable from a vascular perspective in review of her CT scan and planned for annual visit with him.  Looks like she saw her PMD Sept 2020 for constipation, neck pain and discussion about wanting to stop her xarelto .  She was recommended to discuss a/c with us , noting CHA2DS2Vasc score of 5 and she recommended lifelong a/c  Seeing Dr. Glade Lambert APP over the years   I saw her 08/08/22 She is doing very well Exercising, walking on the treadmill, some other exercises, goes to the gym sometimes as well. No CP, palpitations, SOB No hematuria off Xarelto  She feels confident she would know if she has had AFib Has not had any symptoms in years No near syncope or syncope. PMD does her labs I discussed that don't think we have any formal record of her Afib though  she relates a clear and known longstanding diagnosis of this OFF a/c 2/2 recurrent hematuria She did not want to consider alternative OAC, or resuming a/c Discussed monitoring options  Wearable tech, Kardia, perhaps loop monitor. She would not want to pursue loop, but planned to look into the kardia   I saw her 02/14/23 No ongoing CP She remains with the same DOE that goes back to 2019 post-op It is limiting, mentions she has had several tests/evaluations without clear cause. Is not worse, not better No palpitations No dizzy spells, near syncope or syncope. No rest  SOB She reports seeing vascular services annually Offered to send her for PFTs, she will d/w her PMD any pulmonary evaluation/testing recommended Stress test done with this symptoms was good Will update her echo Perhaps get a CPX to better identify etiology of her SOB if echo doesn't solve it  Echo looked OK  I saw her 08/17/23 She is doing quite well No CP, palpitations or cardiac awareness No SOB, DOE Walking regularly for exercise No near syncope or syncope No changes made  C-spine sx 01/21/24  TODAY She is doing well Hoarse voice since her C-spine surgery, sees them next week.  No CP, palpitations or cardiac awareness No SOB No near syncope or syncope.  She snores heavily, says her daughter reports she has observed apneic events and wonders of she should be tested    Past Medical History:  Diagnosis Date   Allergy    Anemia    Arthritis    "qwhere" (07/29/2018)   Atrial fibrillation (HCC)    CHF (congestive heart failure) (HCC)    Clotting disorder (HCC)    Degenerative arthritis of hip    s/p L THR 12/2012   Dyspnea    since surgery in August 2019- "when I get worked up and walk a short distance"   Dysrhythmia    History of blood transfusion 05/2018   "related to OR"   Hyperlipidemia    Hypertension    Intramural hematoma of thoracic aorta (HCC) 06/13/2018   Migraines    mIgraines- none since blood pressure and lipids are under control   Myocardial infarction (HCC) 2000   due to atrial fib   Neuromuscular disorder (HCC)    pinched nerve- left side of neck   Pre-diabetes    Pulmonary emboli (HCC) 12/2012 dx   post op (L THR), anticoag x 43mo    Past Surgical History:  Procedure Laterality Date   ABDOMINAL HYSTERECTOMY     "w/1 tube"   ANTERIOR CERVICAL DECOMP/DISCECTOMY FUSION N/A 01/21/2024   Procedure: ANTERIOR CERVICAL DECOMPRESSION/DISCECTOMY FUSION CERVICAL TWO-CERVICAL THREE;  Surgeon: Agustina Aldrich, MD;  Location: [facility] OR;  Service: Neurosurgery;   Laterality: N/A;  ACDF - C2-C3   ANTERIOR CERVICAL DECOMPRESSION/DISCECTOMY FUSION 4 LEVELS N/A 02/02/2020   Procedure: CERVICAL THREE-FOUR, CERVICAL FOUR-FIVE, CERVICAL FIVE-SIX, CERVICAL SIX-SEVEN ANTERIOR CERVICAL DECOMPRESSION/DISCECTOMY FUSION;  Surgeon: Agustina Aldrich, MD;  Location: [facility] OR;  Service: Neurosurgery;  Laterality: N/A;   ARTERY REPAIR Left 08/15/2018   Procedure: LEFT BRACHIAL ARTERY EXPLORATION;  Surgeon: Adine Hoof, MD;  Location: [facility] OR;  Service: Vascular;  Laterality: Left;   Breast Ultrasound Left 04/16/2013   Done @ breast center Impression: no malignancy appearance noted on the screen study is consistent with a summation shadow   BUNIONECTOMY Left    CARDIAC CATHETERIZATION     CAROTID-SUBCLAVIAN BYPASS GRAFT Left 06/13/2018   Procedure: BYPASS GRAFT CAROTID-SUBCLAVIAN;  Surgeon: Adine Hoof, MD;  Location: [facility]  OR;  Service: Vascular;  Laterality: Left;   COLONOSCOPY     CYSTOSCOPY WITH RETROGRADE PYELOGRAM, URETEROSCOPY AND STENT PLACEMENT Bilateral 11/02/2020   Procedure: DIAGNOSTIC CYSTOSCOPY WITH RETROGRADE PYELOGRAM,BILATERAL DIAGNOSTIC URETEROSCOPY  AND BILATERAL STENT PLACEMENT;  Surgeon: Roxane Copp, MD;  Location: [facility];  Service: Urology;  Laterality: Bilateral;  90 MINS   DG TUMB RIGHT HAND Right    cyst removal   IR THORACENTESIS ASP PLEURAL SPACE W/IMG GUIDE  05/31/2018   JOINT REPLACEMENT     KNEE ARTHROSCOPY Left 12/16/2020   Procedure: LEFT KNEE ARTHROSCOPY WITH PARTIAL LATERAL MENISCECTOMY;  Surgeon: Arnie Lao, MD;  Location: [facility];  Service: Orthopedics;  Laterality: Left;   THORACIC AORTIC ENDOVASCULAR STENT GRAFT Left 06/13/2018   Procedure: LEFT  SUBCLAVIAN TO CAROTID ARTERY TRANSPOSITION; THORACIC AORTIC ENDOVASCULAR STENT GRAFT using GORE CONFORMABLE THORACIC STENT GRAFT AND GORE TAG THORACIC ENDOPROSTHESIS; STENT LEFT COMMON CAROTID ARTERY, RIGHT COMMON-FEMORAL  ENDARTERECTOMY WITH PATCH ANGIOPLASTY;  Surgeon: Adine Hoof, MD;  Location: [facility] OR;  Service: Vascular;  Laterality: Left;   TONSILLECTOMY     TOTAL HIP ARTHROPLASTY Left 01/03/2013   Procedure: TOTAL HIP ARTHROPLASTY ANTERIOR APPROACH;  Surgeon: Arnie Lao, MD;  Location: [facility] ORS;  Service: Orthopedics;  Laterality: Left;  Left Total Hip Arthroplasty, Anterior Approach   TUBAL LIGATION     VAGINA RECONSTRUCTION SURGERY  1966   vagina had closed up when 73 years old   WOUND EXPLORATION Left 06/16/2018   Procedure: LEFT NECK WOUND EXPLORATION, CHEST TUBE INSERTION;  Surgeon: Adine Hoof, MD;  Location: [facility] OR;  Service: Vascular;  Laterality: Left;    Current Outpatient Medications  Medication Sig Dispense Refill   acetaminophen  (TYLENOL ) 500 MG tablet Take 1,000 mg by mouth every 8 (eight) hours as needed for moderate pain.     aspirin  EC 81 MG tablet Take 81 mg by mouth daily.     clindamycin  (CLEOCIN  T) 1 % lotion Apply 1 application  topically as needed (acne).     cyclobenzaprine  (FLEXERIL ) 10 MG tablet Take 1 tablet (10 mg total) by mouth 3 (three) times daily as needed for muscle spasms. (Patient not taking: Reported on 01/28/2024) 30 tablet 0   dapagliflozin  propanediol (FARXIGA ) 10 MG TABS tablet Take 10 mg by mouth daily.     diltiazem  (DILACOR XR ) 240 MG 24 hr capsule Take 1 capsule (240 mg total) by mouth every morning. 90 capsule 3   doxycycline  (VIBRAMYCIN ) 100 MG capsule Take 100 mg by mouth daily. (Patient not taking: Reported on 01/28/2024)     estradiol  (ESTRACE ) 0.1 MG/GM vaginal cream Place 1 Applicatorful vaginally at bedtime. (Patient not taking: Reported on 01/21/2024)     ferrous sulfate  325 (65 FE) MG tablet Take 325 mg by mouth once a week.     furosemide  (LASIX ) 20 MG tablet Take 1 tablet (20 mg total) by mouth at bedtime. 90 tablet 3   HYDROcodone -acetaminophen  (NORCO/VICODIN) 5-325 MG tablet Take 1 tablet by mouth every 4 (four)  hours as needed for moderate pain (pain score 4-6) ((score 4 to 6)). 30 tablet 0   LINZESS  72 MCG capsule TAKE 1 CAPSULE BY MOUTH DAILY BEFORE BREAKFAST 30 capsule 2   metroNIDAZOLE (METROCREAM) 0.75 % cream Apply 1 application  topically as needed (acne).     mirabegron  ER (MYRBETRIQ ) 25 MG TB24 tablet Take 1 tablet (25 mg total) by mouth daily. 90 tablet 3   Multiple Vitamin (MULTIVITAMIN) tablet Take  2 tablets by mouth daily. Gummies     rosuvastatin  (CRESTOR ) 20 MG tablet TAKE 1 TABLET BY MOUTH EVERY DAY 90 tablet 3   Current Facility-Administered Medications  Medication Dose Route Frequency Provider Last Rate Last Admin   methylPREDNISolone  acetate (DEPO-MEDROL ) injection 80 mg  80 mg Intramuscular Once Ulysees Gander, DO        Allergies:   Penicillins, Shellfish allergy, Latex, Banana, Lactose intolerance (gi), Oxycodone , Asa [aspirin ], Celebrex [celecoxib], Kiwi extract, and Nsaids   Social History:  The patient  reports that she has never smoked. She has never used smokeless tobacco. She reports that she does not currently use alcohol. She reports that she does not use drugs.   Family History:  The patient's family history includes Alcohol abuse in an other family member; Breast cancer in an other family member; Diabetes in her mother, sister, and another family member; Heart attack (age of onset: 54) in her mother; Heart disease in her father, mother, and another family member; Hyperlipidemia in an other family member; Hypertension in an other family member.  ROS:  Please see the history of present illness.  All other systems are reviewed and otherwise negative.   PHYSICAL EXAM:  VS:  There were no vitals taken for this visit. BMI: There is no height or weight on file to calculate BMI. Well nourished, well developed, in no acute distress  HEENT: normocephalic, atraumatic  Neck: no JVD, carotid bruits or masses Cardiac: RRR; no PVCs/extrasystoles heard on exam, no significant  murmurs, no rubs, or gallops Lungs: CTA b/l , no wheezing, rhonchi or rales  Abd: soft, nontender MS: no deformity or atrophy Ext: no edema Skin: warm and dry, no rash Neuro:  No gross deficits appreciated Psych: euthymic mood, full affect    EKG:  done today and reviewed by myself SR 93bpm, PVC, LAD Rhythm strip with only 2 PVCs   03/16/23: TTE 1. Left ventricular ejection fraction, by estimation, is 60 to 65%. The  left ventricle has normal function. The left ventricle has no regional  wall motion abnormalities. Left ventricular diastolic parameters are  consistent with Grade I diastolic  dysfunction (impaired relaxation).   2. Right ventricular systolic function is normal. The right ventricular  size is normal. There is normal pulmonary artery systolic pressure.   3. The mitral valve is normal in structure. Trivial mitral valve  regurgitation. No evidence of mitral stenosis.   4. The aortic valve is tricuspid. Aortic valve regurgitation is not  visualized. No aortic stenosis is present.   5. The inferior vena cava is normal in size with greater than 50%  respiratory variability, suggesting right atrial pressure of 3 mmHg.    01/29/23: CT chest/abd/pelvis IMPRESSION: 1. Stable thoracic aortic endograft without evidence of aneurysm or dissection. 2. Aortic atherosclerosis with mild aneurysmal dilatation of the proximal abdominal aorta measuring up to 3.6 cm, decreased from 3.8 cm on the previous exam. Recommend follow-up ultrasound every 2 years. This recommendation follows ACR consensus guidelines: White Paper of the ACR Incidental Findings Committee II on Vascular Findings. J Am Coll Radiol 2013; 10:789-794. 3. Cardiomegaly with coronary artery calcifications. 4. Remaining incidental findings as described above.  10/20/2019: Lexiscan  stress myoview  Nuclear stress EF: 66%. There was no ST segment deviation noted during stress. No T wave inversion was noted during  stress. The study is normal. This is a low risk study. No ischemia. The left ventricular ejection fraction is hyperdynamic (>65%).  10/20/2019 TTE IMPRESSIONS  1. Left ventricular  ejection fraction, by visual estimation, is 60 to 65%. The left ventricle has normal function. There is no left ventricular hypertrophy.  2. Elevated left ventricular end-diastolic pressure.  3. Left ventricular diastolic parameters are consistent with Grade I diastolic dysfunction (impaired relaxation).  4. The left ventricle has no regional wall motion abnormalities.  5. Global right ventricle has normal systolic function.The right ventricular size is normal. No increase in right ventricular wall thickness.  6. Left atrial size was mildly dilated.  7. Right atrial size was normal.  8. The mitral valve is normal in structure. Trivial mitral valve regurgitation. No evidence of mitral stenosis.  9. The tricuspid valve is normal in structure. 10. The aortic valve is normal in structure. Aortic valve regurgitation is not visualized. No evidence of aortic valve sclerosis or stenosis. 11. The pulmonic valve was normal in structure. Pulmonic valve regurgitation is not visualized. 12. Normal pulmonary artery systolic pressure. 13. The tricuspid regurgitant velocity is 2.12 m/s, and with an assumed right atrial pressure of 3 mmHg, the estimated right ventricular systolic pressure is normal at 21.0 mmHg. 14. The inferior vena cava is normal in size with greater than 50% respiratory variability, suggesting right atrial pressure of 3 mmHg.    05/28/2018: TTE Study Conclusions - HPI and indications: 441.01 Dissection of Aorta , Thoracic. - Left ventricle: The cavity size was normal. Systolic function was   normal. The estimated ejection fraction was in the range of 55%   to 60%. Wall motion was normal; there were no regional wall   motion abnormalities. Doppler parameters are consistent with   abnormal left ventricular  relaxation (grade 1 diastolic   dysfunction). - Aortic valve: Trileaflet; mildly thickened leaflets. There was no   regurgitation. - Aortic root: There was evidence of aortic dissection flap noted   in the descending thoracic aorta (0.37cm flap posterior). Seen on   CT scan. - Mitral valve: Calcified annulus. - Tricuspid valve: There was mild regurgitation. - Pulmonary arteries: Systolic pressure was mildly increased. PA   peak pressure: 36 mm Hg (S). - Pericardium, extracardiac: A trivial pericardial effusion was   identified posterior to the heart   2D echo 07/19/2016 Study Conclusions - Left ventricle: Distal septal hypokinesis. The cavity size was   normal. Systolic function was normal. The estimated ejection   fraction was 55%. Wall motion was normal; there were no regional   wall motion abnormalities. Doppler parameters are consistent with   both elevated ventricular end-diastolic filling pressure and   elevated left atrial filling pressure. - Atrial septum: No defect or patent foramen ovale was identified. - Pericardium, extracardiac: A trivial pericardial effusion was   identified posterior to the heart  Recent Labs: 08/17/2023: ALT 12 01/21/2024: BUN 17; Creatinine, Ser 0.71; Hemoglobin 10.4; Platelets 193; Potassium 3.7; Sodium 138  08/17/2023: Chol/HDL Ratio 3.6; Cholesterol, Total 200; HDL 55; LDL Chol Calc (NIH) 128; Triglycerides 92   CrCl cannot be calculated (Patient's most recent lab result is older than the maximum 21 days allowed.).   Wt Readings from Last 3 Encounters:  01/28/24 166 lb 14.4 oz (75.7 kg)  01/21/24 168 lb (76.2 kg)  01/03/24 172 lb 12.8 oz (78.4 kg)     Other studies reviewed: Additional studies/records reviewed today include: summarized above  ASSESSMENT AND PLAN:  1. Atrial fibrillation     CHA2DS2Vasc is 5 (including gender)     H/o provoked DVT/PE (post hip surgery)     No ongoing symptoms/palpitations   I don't  think we have any  formal record of her Afib though she relates a clear and known longstanding diagnosis of this OFF a/c 2/2 recurrent hematuria  She has not wanted to consider alternative OAC, or resuming a/c We have previously discussed monitoring options , wearable tech, Kardia, perhaps loop monitor. She would not want to pursue loop  No palpitations, symptoms of arrhythmia   2. HTN     Looks good   3. Dyslipidemia We manage, update her labs On crestor   4. PVC on her EKG, only 2 on a rhythm strips, none heard on exam Check lytes No symptoms/palpitations  5. Snoring, family observed apneic events Sleep study   Disposition:  back in a year, sooner if needed   Current medicines are reviewed at length with the patient today.  The patient did not have any concerns regarding medicines.  [place], PA-C 02/19/2024 12:37 PM     [facility] [address] [place] [address] [phone] (office)  [phone] (fax)

## 2024-02-20 ENCOUNTER — Encounter: Payer: Self-pay | Admitting: Physician Assistant

## 2024-02-20 ENCOUNTER — Ambulatory Visit: Attending: Physician Assistant | Admitting: Physician Assistant

## 2024-02-20 VITALS — BP 112/74 | HR 93 | Ht 64.0 in | Wt 164.8 lb

## 2024-02-20 DIAGNOSIS — G4730 Sleep apnea, unspecified: Secondary | ICD-10-CM | POA: Insufficient documentation

## 2024-02-20 DIAGNOSIS — I493 Ventricular premature depolarization: Secondary | ICD-10-CM | POA: Diagnosis not present

## 2024-02-20 DIAGNOSIS — I10 Essential (primary) hypertension: Secondary | ICD-10-CM | POA: Insufficient documentation

## 2024-02-20 DIAGNOSIS — I480 Paroxysmal atrial fibrillation: Secondary | ICD-10-CM | POA: Insufficient documentation

## 2024-02-20 DIAGNOSIS — E785 Hyperlipidemia, unspecified: Secondary | ICD-10-CM | POA: Insufficient documentation

## 2024-02-20 NOTE — Patient Instructions (Addendum)
 Medication Instructions:   Your physician recommends that you continue on your current medications as directed. Please refer to the Current Medication list given to you today.  *If you need a refill on your cardiac medications before your next appointment, please call your pharmacy*   Lab Work:  CMET  LIPIDS AND MAG TODAY    If you have labs (blood work) drawn today and your tests are completely normal, you will receive your results only by: MyChart Message (if you have MyChart) OR A paper copy in the mail If you have any lab test that is abnormal or we need to change your treatment, we will call you to review the results.  Testing/Procedures:  YOU HAVE BEEN RECOMMENDED FOR A SLEEP TEST AND SOME WILL CONTACT YOU WITH FURTHER STEPS    Follow-Up: At [facility], you and your health needs are our priority.  As part of our continuing mission to provide you with exceptional heart care, our providers are all part of one team.  This team includes your primary Cardiologist (physician) and Advanced Practice Providers or APPs (Physician Assistants and Nurse Practitioners) who all work together to provide you with the care you need, when you need it.  Your next appointment:    1 year(s)  Provider:   Agatha Horsfall, MD or Mertha Abrahams, PA-C    We recommend signing up for the patient portal called "MyChart".  Sign up information is provided on this After Visit Summary.  MyChart is used to connect with patients for Virtual Visits (Telemedicine).  Patients are able to view lab/test results, encounter notes, upcoming appointments, etc.  Non-urgent messages can be sent to your provider as well.   To learn more about what you can do with MyChart, go to [email or web address].   Other Instructions

## 2024-02-21 LAB — COMPREHENSIVE METABOLIC PANEL WITH GFR
ALT: 14 IU/L (ref 0–32)
AST: 24 IU/L (ref 0–40)
Albumin: 4.3 g/dL (ref 3.8–4.8)
Alkaline Phosphatase: 137 IU/L — ABNORMAL HIGH (ref 44–121)
BUN/Creatinine Ratio: 12 (ref 12–28)
BUN: 15 mg/dL (ref 8–27)
Bilirubin Total: 0.6 mg/dL (ref 0.0–1.2)
CO2: 24 mmol/L (ref 20–29)
Calcium: 9.4 mg/dL (ref 8.7–10.3)
Chloride: 101 mmol/L (ref 96–106)
Creatinine, Ser: 1.23 mg/dL — ABNORMAL HIGH (ref 0.57–1.00)
Globulin, Total: 2.8 g/dL (ref 1.5–4.5)
Glucose: 82 mg/dL (ref 70–99)
Potassium: 4.2 mmol/L (ref 3.5–5.2)
Sodium: 142 mmol/L (ref 134–144)
Total Protein: 7.1 g/dL (ref 6.0–8.5)
eGFR: 47 mL/min/{1.73_m2} — ABNORMAL LOW (ref >59)

## 2024-02-21 LAB — LIPID PANEL
Chol/HDL Ratio: 3.9 ratio (ref 0.0–4.4)
Cholesterol, Total: 200 mg/dL — ABNORMAL HIGH (ref 100–199)
HDL: 51 mg/dL (ref >39)
LDL Chol Calc (NIH): 132 mg/dL — ABNORMAL HIGH (ref 0–99)
Triglycerides: 97 mg/dL (ref 0–149)
VLDL Cholesterol Cal: 17 mg/dL (ref 5–40)

## 2024-02-21 LAB — MAGNESIUM: Magnesium: 2.2 mg/dL (ref 1.6–2.3)

## 2024-02-22 DIAGNOSIS — N8189 Other female genital prolapse: Secondary | ICD-10-CM | POA: Diagnosis not present

## 2024-02-22 DIAGNOSIS — Z780 Asymptomatic menopausal state: Secondary | ICD-10-CM | POA: Diagnosis not present

## 2024-02-22 DIAGNOSIS — Z01411 Encounter for gynecological examination (general) (routine) with abnormal findings: Secondary | ICD-10-CM | POA: Diagnosis not present

## 2024-02-22 DIAGNOSIS — N7689 Other specified inflammation of vagina and vulva: Secondary | ICD-10-CM | POA: Diagnosis not present

## 2024-02-29 DIAGNOSIS — H538 Other visual disturbances: Secondary | ICD-10-CM | POA: Diagnosis not present

## 2024-02-29 DIAGNOSIS — T8529XA Other mechanical complication of intraocular lens, initial encounter: Secondary | ICD-10-CM | POA: Diagnosis not present

## 2024-03-05 DIAGNOSIS — M4802 Spinal stenosis, cervical region: Secondary | ICD-10-CM | POA: Diagnosis not present

## 2024-03-05 DIAGNOSIS — Z6827 Body mass index (BMI) 27.0-27.9, adult: Secondary | ICD-10-CM | POA: Diagnosis not present

## 2024-03-28 DIAGNOSIS — L718 Other rosacea: Secondary | ICD-10-CM | POA: Diagnosis not present

## 2024-03-28 DIAGNOSIS — L218 Other seborrheic dermatitis: Secondary | ICD-10-CM | POA: Diagnosis not present

## 2024-04-21 ENCOUNTER — Telehealth: Payer: Self-pay

## 2024-04-21 NOTE — Telephone Encounter (Signed)
**Note De-Identified Ami [place] Obfuscation** Per the Traditional Medicare Part A and B website a PA for CPT Code: [phone] (Home Sleep Study Type 3) is not required.  I called the pt and made her aware of this and I provided her with the [facility] phone number so she can call them to schedule her test.

## 2024-05-06 ENCOUNTER — Ambulatory Visit (HOSPITAL_BASED_OUTPATIENT_CLINIC_OR_DEPARTMENT_OTHER): Attending: Physician Assistant | Admitting: Cardiology

## 2024-05-06 DIAGNOSIS — G4733 Obstructive sleep apnea (adult) (pediatric): Secondary | ICD-10-CM | POA: Insufficient documentation

## 2024-05-06 DIAGNOSIS — G4730 Sleep apnea, unspecified: Secondary | ICD-10-CM | POA: Diagnosis present

## 2024-05-06 DIAGNOSIS — G4736 Sleep related hypoventilation in conditions classified elsewhere: Secondary | ICD-10-CM | POA: Insufficient documentation

## 2024-05-13 NOTE — Procedures (Signed)
 Darryle [facility] [address] [place], [place] [address] Tel: [phone]   Fax: [phone]  Home Sleep Test Interpretation  Patient Name: Samantha Stevenson, Samantha Stevenson Date: 05/06/2024  Date of Birth: 1951-04-11 Study Type: HST  Age: 73 year MRN #: [id number]  Sex: Female Interpreting Physician: SHLOMO CORNING, [place]  Height: 5' 4 Referring Physician: Charlies Macario Arthur, PA-C  Weight: 164.0 lbs Recording Tech: Holly Neeriemer RPSGT RST  BMI: 28.3 Scoring Tech: Will Poet RRT RPSGT RST  ESS: 21 Neck Size: 14  %%star Indications for Polysomnography The patient is a 73 year old Female who is 5' 4 and weighs 164.0 lbs. Her BMI equals 28.3.  A home sleep apnea test was performed to evaluate for obstructive sleep apnea.  Medication  No Data.   Polysomnogram Data A home sleep test recorded the standard physiologic parameters including EKG, nasal and oral airflow.  Respiratory parameters of chest and abdominal movements were recorded with Respiratory Inductance Plethysmography belts.  Oxygen saturation was recorded by pulse oximetry.   Study Architecture The total recording time of the polysomnogram was 368.0 minutes.  The total monitoring time was 368.5 minutes.  Time spent in Supine position was 223.5 minutes.   Respiratory Events The study revealed a presence of 68 obstructive, 0 central, and 0 mixed apneas resulting in an Apnea index of 11.1 events per hour. There were 69 hypopneas (>=3% desaturation and/or arousal) resulting in an Apnea\Hypopnea Index (AHI >=3% desaturation and/or arousal) of 22.3 events per hour.  There were 46 hypopneas (>=4% desaturation) resulting in an Apnea\Hypopnea Index (AHI >=4% desaturation) of 18.6 events per hour.  There were 0 respiratory Effort Related Arousals resulting in a RERA index of  events per hour. The Respiratory Disturbance Index is 22.3 events per hour.  The snore index was 0 events per hour.  Mean oxygen  saturation was 94.9%.  The lowest oxygen saturation during monitoring time was 78.0%.  Time spent <=88% oxygen saturation was 11.5 minutes (3.1%).  Cardiac Summary The average pulse rate was 58.3 bpm.  The minimum pulse rate was 49.0 bpm while the maximum pulse rate was 82.0 bpm.    Diagnosis:  Moderate obstructive sleep apnea Nocturnal hypoxemia  Recommendations: 1.  Clinical correlation of these findings is necessary.  The decision to treat obstructive sleep apnea (OSA) is usually based on the presence of apnea symptoms or the presence of associated medical conditions such as Hypertension, Congestive Heart Failure, Atrial Fibrillation or Obesity.  The most common symptoms of OSA are snoring, gasping for breath while sleeping, daytime sleepiness and fatigue.   2.  Initiating apnea therapy is recommended given the presence of symptoms and/or associated conditions. Recommend proceeding with one of the following:     a.  Auto-CPAP therapy with a pressure range of 5-20cm H2O.     b.  An oral appliance (OA) that can be obtained from certain dentists with expertise in sleep medicine.  These are primarily of use in non-obese patients with mild and moderate disease.     c.  An ENT consultation which may be useful to look for specific causes of obstruction and possible treatment options.     d.  If patient is intolerant to PAP therapy, consider referral to ENT for evaluation for hypoglossal nerve stimulator.   3.  Close follow-up is necessary to ensure success with CPAP or oral appliance therapy for maximum benefit.  4.  A follow-up oximetry study on CPAP is recommended to assess the adequacy  of therapy and determine the need for supplemental oxygen or the potential need for Bi-level therapy.  An arterial blood gas to determine the adequacy of baseline ventilation and oxygenation should also be considered.  5.  Healthy sleep recommendations include:  adequate nightly sleep (normal 7-9 hrs/night),  avoidance of caffeine after noon and alcohol near bedtime, and maintaining a sleep environment that is cool, dark and quiet.  6.  Weight loss for overweight patients is recommended.  Even modest amounts of weight loss can significantly improve the severity of sleep apnea.  7.  Snoring recommendations include:  weight loss where appropriate, side sleeping, and avoidance of alcohol before bed.  8.  Operation of motor vehicle should be avoided when sleepy.    This study was personally reviewed and electronically signed by: Wilbert Bihari, MD Accredited Board Certified in Sleep Medicine Date/Time: 05/13/2024 10:51AM

## 2024-05-16 ENCOUNTER — Other Ambulatory Visit: Payer: Self-pay

## 2024-05-16 ENCOUNTER — Encounter (HOSPITAL_COMMUNITY): Payer: Self-pay | Admitting: *Deleted

## 2024-05-16 ENCOUNTER — Emergency Department (HOSPITAL_COMMUNITY)
Admission: EM | Admit: 2024-05-16 | Discharge: 2024-05-17 | Disposition: A | Discharge status: Home / Self Care | Attending: Emergency Medicine | Admitting: Emergency Medicine

## 2024-05-16 DIAGNOSIS — K5792 Diverticulitis of intestine, part unspecified, without perforation or abscess without bleeding: Secondary | ICD-10-CM

## 2024-05-16 DIAGNOSIS — K5730 Diverticulosis of large intestine without perforation or abscess without bleeding: Secondary | ICD-10-CM | POA: Diagnosis not present

## 2024-05-16 DIAGNOSIS — K5732 Diverticulitis of large intestine without perforation or abscess without bleeding: Secondary | ICD-10-CM | POA: Diagnosis not present

## 2024-05-16 DIAGNOSIS — I7090 Unspecified atherosclerosis: Secondary | ICD-10-CM | POA: Diagnosis not present

## 2024-05-16 DIAGNOSIS — R109 Unspecified abdominal pain: Secondary | ICD-10-CM | POA: Diagnosis not present

## 2024-05-16 LAB — CBC WITH DIFFERENTIAL/PLATELET
Abs Immature Granulocytes: 0.02 K/uL (ref 0.00–0.07)
Basophils Absolute: 0 K/uL (ref 0.0–0.1)
Basophils Relative: 0 %
Eosinophils Absolute: 0.2 K/uL (ref 0.0–0.5)
Eosinophils Relative: 3 %
HCT: 33.4 % — ABNORMAL LOW (ref 36.0–46.0)
Hemoglobin: 10.4 g/dL — ABNORMAL LOW (ref 12.0–15.0)
Immature Granulocytes: 0 %
Lymphocytes Relative: 29 %
Lymphs Abs: 2 K/uL (ref 0.7–4.0)
MCH: 28.3 pg (ref 26.0–34.0)
MCHC: 31.1 g/dL (ref 30.0–36.0)
MCV: 91 fL (ref 80.0–100.0)
Monocytes Absolute: 0.5 K/uL (ref 0.1–1.0)
Monocytes Relative: 7 %
Neutro Abs: 4.2 K/uL (ref 1.7–7.7)
Neutrophils Relative %: 61 %
Platelets: 293 K/uL (ref 150–400)
RBC: 3.67 MIL/uL — ABNORMAL LOW (ref 3.87–5.11)
RDW: 14.9 % (ref 11.5–15.5)
WBC: 7 K/uL (ref 4.0–10.5)
nRBC: 0 % (ref 0.0–0.2)

## 2024-05-16 LAB — COMPREHENSIVE METABOLIC PANEL WITH GFR
ALT: 10 U/L (ref 0–44)
AST: 18 U/L (ref 15–41)
Albumin: 3.2 g/dL — ABNORMAL LOW (ref 3.5–5.0)
Alkaline Phosphatase: 92 U/L (ref 38–126)
Anion gap: 10 (ref 5–15)
BUN: 18 mg/dL (ref 8–23)
CO2: 24 mmol/L (ref 22–32)
Calcium: 9 mg/dL (ref 8.9–10.3)
Chloride: 105 mmol/L (ref 98–111)
Creatinine, Ser: 1.1 mg/dL — ABNORMAL HIGH (ref 0.44–1.00)
GFR, Estimated: 54 mL/min — ABNORMAL LOW (ref >60)
Glucose, Bld: 105 mg/dL — ABNORMAL HIGH (ref 70–99)
Potassium: 3.6 mmol/L (ref 3.5–5.1)
Sodium: 139 mmol/L (ref 135–145)
Total Bilirubin: 0.8 mg/dL (ref 0.0–1.2)
Total Protein: 7.1 g/dL (ref 6.5–8.1)

## 2024-05-16 LAB — PROTIME-INR
INR: 1 (ref 0.8–1.2)
Prothrombin Time: 13.7 s (ref 11.4–15.2)

## 2024-05-16 LAB — LIPASE, BLOOD: Lipase: 48 U/L (ref 11–51)

## 2024-05-16 LAB — TYPE AND SCREEN
ABO/RH(D): B POS
Antibody Screen: NEGATIVE

## 2024-05-16 NOTE — ED Triage Notes (Signed)
 Patient arrives POV c/o abdominal pain and blood in stool that started on Wednesday. Patient reports a whole lot of constipation and blood in stool. Patient also report mucous in stool. Denies hx of UC or Crohns.

## 2024-05-16 NOTE — ED Provider Triage Note (Signed)
 Emergency Medicine Provider Triage Evaluation Note  Evangelia Whitaker , a 73 y.o. female  was evaluated in triage.  Pt complains of abdominal pain, blood in stool, bright red blood in stool.  Is not on blood thinner.  Unsure of last colonoscopy.  Has not had anything like this in the past this has been ongoing for three days.   Review of Systems  Positive: Abdominal pain, nausea Negative: Fever  Physical Exam  BP (!) 143/70 (BP Location: Right Arm)   Pulse 83   Temp 98.2 F (36.8 C)   Resp (!) 24   Ht 5' 4 (1.626 m)   Wt 74.8 kg   SpO2 95%   BMI 28.31 kg/m  Gen:   Awake, no distress   Resp:  Normal effort  MSK:   Moves extremities without difficulty  Other:    Medical Decision Making  Medically screening exam initiated at 9:25 PM.  Appropriate orders placed.  Verne Wetherington was informed that the remainder of the evaluation will be completed by another provider, this initial triage assessment does not replace that evaluation, and the importance of remaining in the ED until their evaluation is complete.     Shermon Warren SAILOR, DEVONNA 05/16/24 2129

## 2024-05-17 ENCOUNTER — Emergency Department (HOSPITAL_COMMUNITY)

## 2024-05-17 DIAGNOSIS — R109 Unspecified abdominal pain: Secondary | ICD-10-CM | POA: Diagnosis not present

## 2024-05-17 DIAGNOSIS — K5732 Diverticulitis of large intestine without perforation or abscess without bleeding: Secondary | ICD-10-CM | POA: Diagnosis not present

## 2024-05-17 DIAGNOSIS — K5730 Diverticulosis of large intestine without perforation or abscess without bleeding: Secondary | ICD-10-CM | POA: Diagnosis not present

## 2024-05-17 MED ORDER — HYDROCODONE-ACETAMINOPHEN 5-325 MG PO TABS: 1.0000 | ORAL_TABLET | ORAL | 0 refills | Status: DC | PRN

## 2024-05-17 MED ORDER — IOHEXOL 350 MG/ML SOLN
75.0000 mL | Freq: Once | INTRAVENOUS | Status: AC | PRN
  Administered 2024-05-17: 75 mL via INTRAVENOUS

## 2024-05-17 MED ORDER — CIPROFLOXACIN HCL 500 MG PO TABS: 500.0000 mg | ORAL_TABLET | Freq: Two times a day (BID) | ORAL | 0 refills | Status: DC

## 2024-05-17 MED ORDER — METRONIDAZOLE 500 MG PO TABS
500.0000 mg | ORAL_TABLET | Freq: Two times a day (BID) | ORAL | 0 refills | Status: DC
Start: 2024-05-17 — End: 2024-06-26

## 2024-05-17 NOTE — Discharge Instructions (Signed)
 Your CT scan is consistent with diverticulitis.  Please take antibiotics as directed.  There was also incidental finding of atherosclerosis.  This is nonemergent, but you should discuss this with your doctor.  Please return for new or worsening symptoms.  You should have follow-up with a GI doctor following resolution of your symptoms as well, as you may need a colonoscopy.

## 2024-05-17 NOTE — ED Provider Notes (Signed)
 [facility] Emergency Department Provider Note MRN:  [id number]  Arrival date & time: 05/17/24     Chief Complaint   Abdominal Pain and Blood In Stools   History of Present Illness   Samantha Stevenson is a 73 y.o. year-old female presents to the ED with chief complaint of abdominal pain.  States that she has been having symptoms since Wednesday.  States that she has had some blood in her stool.  States that she has abdominal pain, back pain.  States has some seeping from the anus.  States that she has seen some blood with the mucous.  States that she has hard stools.  Has tried taking a linzess , which is prescribed by Dr. Legrand.  History provided by patient.   Review of Systems  Pertinent positive and negative review of systems noted in HPI.    Physical Exam   Vitals:   05/17/24 0500 05/17/24 0627  BP: (!) 143/64 134/66  Pulse: 77 77  Resp: 17 20  Temp: 98 F (36.7 C) 97.8 F (36.6 C)  SpO2: 100% 100%    CONSTITUTIONAL:  non toxic-appearing, NAD NEURO:  Alert and oriented x 3, CN 3-12 grossly intact EYES:  eyes equal and reactive ENT/NECK:  Supple, no stridor  CARDIO:  normal rate, regular rhythm, appears well-perfused  PULM:  No respiratory distress, CTAB GI/GU:  non-distended,  MSK/SPINE:  No gross deformities, no edema, moves all extremities  SKIN:  no rash, atraumatic   *Additional and/or pertinent findings included in MDM below  Diagnostic and Interventional Summary    EKG Interpretation Date/Time:    Ventricular Rate:    PR Interval:    QRS Duration:    QT Interval:    QTC Calculation:   R Axis:      Text Interpretation:         Labs Reviewed  COMPREHENSIVE METABOLIC PANEL WITH GFR - Abnormal; Notable for the following components:      Result Value   Glucose, Bld 105 (*)    Creatinine, Ser 1.10 (*)    Albumin  3.2 (*)    GFR, Estimated 54 (*)    All other components within normal limits  CBC WITH DIFFERENTIAL/PLATELET  - Abnormal; Notable for the following components:   RBC 3.67 (*)    Hemoglobin 10.4 (*)    HCT 33.4 (*)    All other components within normal limits  PROTIME-INR  LIPASE, BLOOD  TYPE AND SCREEN    CT ABDOMEN PELVIS W CONTRAST  Final Result      Medications  iohexol  (OMNIPAQUE ) 350 MG/ML injection 75 mL (75 mLs Intravenous Contrast Given 05/17/24 0559)     Procedures  /  Critical Care Procedures  ED Course and Medical Decision Making  I have reviewed the triage vital signs, the nursing notes, and pertinent available records from the EMR.  Social Determinants Affecting Complexity of Care: Patient has no clinically significant social determinants affecting this chief complaint..   ED Course: Clinical Course as of 05/17/24 2159  Sat May 17, 2024  0615 CBC with Differential(!) No leukocytosis to suggest severe infection, hemoglobin is stable at 10.4 [RB]  0616 Comprehensive metabolic panel(!) No significant electrolyte abnormality [RB]  0616 Lipase, blood Lipase is normal, doubt pancreatitis [RB]  0626 CT ABDOMEN PELVIS W CONTRAST CT notable for diverticulitis.  Will treat with Cipro  and Flagyl .  After the patient treatment in the hospital with IV medications, patient states that she would prefer to go home. [RB]  Clinical Course User Index [RB] Vicky Charleston, PA-C    Medical Decision Making Patient presenting with generalized lower abdominal pain.  She states that she thinks she is constipated.  She has noted some blood in her stool.  She denies any fevers or chills.  She has been taking Linzess  for constipation.  Will check labs and imaging.  Labs discussed and ED course.  CT consistent with diverticulitis.  She does not appear toxic.  Vital signs are stable.  I offer the patient admission, but she states she would rather go home.  Feel that this is reasonable given how well the patient looks.  Will discharge home on antibiotics and will give some pain medicine.   The patient given strict return precautions.  She understands and agrees with the plan.  Amount and/or Complexity of Data Reviewed Labs:  Decision-making details documented in ED Course. Radiology: ordered. Decision-making details documented in ED Course.  Risk Prescription drug management.         Consultants: No consultations were needed in caring for this patient.   Treatment and Plan: I considered admission due to patient's initial presentation, but after considering the examination and diagnostic results, patient will not require admission and can be discharged with outpatient follow-up.    Final Clinical Impressions(s) / ED Diagnoses     ICD-10-CM   1. Diverticulitis  K57.92     2. Atherosclerosis  I70.90       ED Discharge Orders          Ordered    ciprofloxacin  (CIPRO ) 500 MG tablet  Every 12 hours        05/17/24 0624    metroNIDAZOLE  (FLAGYL ) 500 MG tablet  2 times daily        05/17/24 9375    HYDROcodone -acetaminophen  (NORCO/VICODIN) 5-325 MG tablet  Every 4 hours PRN        05/17/24 9375              Discharge Instructions Discussed with and Provided to Patient:     Discharge Instructions      Your CT scan is consistent with diverticulitis.  Please take antibiotics as directed.  There was also incidental finding of atherosclerosis.  This is nonemergent, but you should discuss this with your doctor.  Please return for new or worsening symptoms.  You should have follow-up with a GI doctor following resolution of your symptoms as well, as you may need a colonoscopy.       Vicky Charleston, PA-C 05/17/24 2159    Carita Senior, MD 05/18/24 [phone]

## 2024-06-19 ENCOUNTER — Telehealth: Payer: Self-pay | Admitting: Family Medicine

## 2024-06-19 DIAGNOSIS — M4802 Spinal stenosis, cervical region: Secondary | ICD-10-CM | POA: Diagnosis not present

## 2024-06-19 NOTE — Telephone Encounter (Signed)
 Patient dropped off DMV form to be signed by Dr Mercer. Form in folder

## 2024-06-24 NOTE — Telephone Encounter (Signed)
 Spoke with patient, patient is seeing [facility] and would need for them to fill out the paperwork, paper has been faxed to them [phone] and patient is aware

## 2024-06-26 ENCOUNTER — Encounter: Payer: Self-pay | Admitting: Physician Assistant

## 2024-06-26 ENCOUNTER — Ambulatory Visit: Admitting: Physician Assistant

## 2024-06-26 VITALS — BP 126/72 | HR 71 | Ht 64.0 in | Wt 161.0 lb

## 2024-06-26 DIAGNOSIS — Z8719 Personal history of other diseases of the digestive system: Secondary | ICD-10-CM

## 2024-06-26 DIAGNOSIS — K921 Melena: Secondary | ICD-10-CM

## 2024-06-26 DIAGNOSIS — K5909 Other constipation: Secondary | ICD-10-CM | POA: Diagnosis not present

## 2024-06-26 MED ORDER — NA SULFATE-K SULFATE-MG SULF 17.5-3.13-1.6 GM/177ML PO SOLN: 1.0000 | Freq: Once | ORAL | 0 refills | Status: AC

## 2024-06-26 MED ORDER — LINACLOTIDE 145 MCG PO CAPS: 145.0000 ug | ORAL_CAPSULE | Freq: Every day | ORAL | 3 refills | Status: AC

## 2024-06-26 NOTE — Progress Notes (Signed)
 Chief Complaint: Blood in stools  HPI:    Samantha Stevenson is a 73 year old African-American female with a past medical history as listed below including sleep apnea, A-fib not on anticoagulation (03/12/2023 echo with LVEF 60-65% grade 1 diastolic dysfunction), CHF, PE and multiple others, known to Dr. Legrand, who was referred to me by [name], MD for a complaint of blood in stools.      09/12/2019 colonoscopy with pandiverticulosis and an 8 mm ascending colon pedunculated tubular adenoma.  Repeat recommended in 7 years.  EGD normal except for distal esophageal tortuosity.    03/08/2022 office visit with Dr. Legrand for abdominal pain and chronic constipation.  At that point discussed chronic constipation abdominal pain and bloating.  Thought to be combination of slow colonic motility, possible pelvic floor dysfunction and possible rectocele.  CT ordered.  Started on Linzess  72 mcg once daily.    02/20/24 cardiology appointment    05/16/2024 patient seen in the ER for diverticulitis.  Patient given treatment with Cipro  and Flagyl .    05/17/2024 CTAP with contrast with diffuse colonic diverticulosis with wall thickening in the distal sigmoid colon and pericolonic edema/inflammation compatible with acute diverticulitis.  Small to moderate stool volume.    05/16/2024 CBC with a hemoglobin of 10.4 (appears around baseline over the past 3 years).  MCV normal.  CMP with a creatinine of 1.10 and otherwise normal.  PT/INR and lipase normal.    Today, the patient tells me that she developed some left lower quadrant pain and saw some blood in her stool and proceeded to the ER.  She had imaging as above which showed diverticulitis.  She took a week of Cipro  and Flagyl  and has had no further symptoms since.  She continues with her chronic constipation and tells me that she is on Linzess  72 mcg daily but this does not seem to be working like it used to.  She may not have a bowel movement at all unless she eats  something very high in fiber.  Denies seeing any further bleeding.  No fever or chills.    Denies weight loss or symptoms that awaken her from sleep.  Past Medical History:  Diagnosis Date   Allergy    Anemia    Arthritis    qwhere (07/29/2018)   Atrial fibrillation (HCC)    CHF (congestive heart failure) (HCC)    Clotting disorder (HCC)    Degenerative arthritis of hip    s/p L THR 12/2012   Dyspnea    since surgery in August 2019- when I get worked up and walk a short distance   Dysrhythmia    History of blood transfusion 05/2018   related to OR   Hyperlipidemia    Hypertension    Intramural hematoma of thoracic aorta (HCC) 06/13/2018   Migraines    mIgraines- none since blood pressure and lipids are under control   Myocardial infarction (HCC) 2000   due to atrial fib   Neuromuscular disorder (HCC)    pinched nerve- left side of neck   Pre-diabetes    Pulmonary emboli (HCC) 12/2012 dx   post op (L THR), anticoag x 46mo    Past Surgical History:  Procedure Laterality Date   ABDOMINAL HYSTERECTOMY     w/1 tube   ANTERIOR CERVICAL DECOMP/DISCECTOMY FUSION N/A 01/21/2024   Procedure: ANTERIOR CERVICAL DECOMPRESSION/DISCECTOMY FUSION CERVICAL TWO-CERVICAL THREE;  Surgeon: Louis Shove, MD;  Location: [facility] OR;  Service: Neurosurgery;  Laterality: N/A;  ACDF -  C2-C3   ANTERIOR CERVICAL DECOMPRESSION/DISCECTOMY FUSION 4 LEVELS N/A 02/02/2020   Procedure: CERVICAL THREE-FOUR, CERVICAL FOUR-FIVE, CERVICAL FIVE-SIX, CERVICAL SIX-SEVEN ANTERIOR CERVICAL DECOMPRESSION/DISCECTOMY FUSION;  Surgeon: Louis Shove, MD;  Location: [facility] OR;  Service: Neurosurgery;  Laterality: N/A;   ARTERY REPAIR Left 08/15/2018   Procedure: LEFT BRACHIAL ARTERY EXPLORATION;  Surgeon: Sheree Penne Bruckner, MD;  Location: [facility] OR;  Service: Vascular;  Laterality: Left;   Breast Ultrasound Left 04/16/2013   Done @ breast center Impression: no malignancy appearance noted on the screen study is consistent with  a summation shadow   BUNIONECTOMY Left    CARDIAC CATHETERIZATION     CAROTID-SUBCLAVIAN BYPASS GRAFT Left 06/13/2018   Procedure: BYPASS GRAFT CAROTID-SUBCLAVIAN;  Surgeon: Sheree Penne Bruckner, MD;  Location: [facility] OR;  Service: Vascular;  Laterality: Left;   COLONOSCOPY     CYSTOSCOPY WITH RETROGRADE PYELOGRAM, URETEROSCOPY AND STENT PLACEMENT Bilateral 11/02/2020   Procedure: DIAGNOSTIC CYSTOSCOPY WITH RETROGRADE PYELOGRAM,BILATERAL DIAGNOSTIC URETEROSCOPY  AND BILATERAL STENT PLACEMENT;  Surgeon: Elisabeth Valli BIRCH, MD;  Location: [facility];  Service: Urology;  Laterality: Bilateral;  90 MINS   DG TUMB RIGHT HAND Right    cyst removal   IR THORACENTESIS ASP PLEURAL SPACE W/IMG GUIDE  05/31/2018   JOINT REPLACEMENT     KNEE ARTHROSCOPY Left 12/16/2020   Procedure: LEFT KNEE ARTHROSCOPY WITH PARTIAL LATERAL MENISCECTOMY;  Surgeon: Vernetta Bruckner GRADE, MD;  Location: [facility];  Service: Orthopedics;  Laterality: Left;   THORACIC AORTIC ENDOVASCULAR STENT GRAFT Left 06/13/2018   Procedure: LEFT  SUBCLAVIAN TO CAROTID ARTERY TRANSPOSITION; THORACIC AORTIC ENDOVASCULAR STENT GRAFT using GORE CONFORMABLE THORACIC STENT GRAFT AND GORE TAG THORACIC ENDOPROSTHESIS; STENT LEFT COMMON CAROTID ARTERY, RIGHT COMMON-FEMORAL ENDARTERECTOMY WITH PATCH ANGIOPLASTY;  Surgeon: Sheree Penne Bruckner, MD;  Location: [facility] OR;  Service: Vascular;  Laterality: Left;   TONSILLECTOMY     TOTAL HIP ARTHROPLASTY Left 01/03/2013   Procedure: TOTAL HIP ARTHROPLASTY ANTERIOR APPROACH;  Surgeon: Bruckner GRADE Vernetta, MD;  Location: [facility] ORS;  Service: Orthopedics;  Laterality: Left;  Left Total Hip Arthroplasty, Anterior Approach   TUBAL LIGATION     VAGINA RECONSTRUCTION SURGERY  1966   vagina had closed up when 73 years old   WOUND EXPLORATION Left 06/16/2018   Procedure: LEFT NECK WOUND EXPLORATION, CHEST TUBE INSERTION;  Surgeon: Sheree Penne Bruckner, MD;  Location: [facility]  OR;  Service: Vascular;  Laterality: Left;    Current Outpatient Medications  Medication Sig Dispense Refill   acetaminophen  (TYLENOL ) 500 MG tablet Take 1,000 mg by mouth every 8 (eight) hours as needed for moderate pain.     aspirin  EC 81 MG tablet Take 81 mg by mouth daily.     clindamycin  (CLEOCIN  T) 1 % lotion Apply 1 application  topically as needed (acne).     cyclobenzaprine  (FLEXERIL ) 10 MG tablet Take 1 tablet (10 mg total) by mouth 3 (three) times daily as needed for muscle spasms. 30 tablet 0   dapagliflozin  propanediol (FARXIGA ) 10 MG TABS tablet Take 10 mg by mouth daily.     diltiazem  (DILACOR XR ) 240 MG 24 hr capsule Take 1 capsule (240 mg total) by mouth every morning. 90 capsule 3   ferrous sulfate  325 (65 FE) MG tablet Take 325 mg by mouth once a week.     fluticasone  (CUTIVATE ) 0.05 % cream Apply 1 Application topically daily.     furosemide  (LASIX ) 20 MG tablet Take 1 tablet (20 mg total) by mouth at bedtime. 90  tablet 3   LINZESS  72 MCG capsule TAKE 1 CAPSULE BY MOUTH DAILY BEFORE BREAKFAST 30 capsule 2   metroNIDAZOLE  (METROCREAM ) 0.75 % cream Apply 1 application  topically as needed (acne).     mirabegron  ER (MYRBETRIQ ) 25 MG TB24 tablet Take 1 tablet (25 mg total) by mouth daily. 90 tablet 3   Multiple Vitamin (MULTIVITAMIN) tablet Take 2 tablets by mouth daily. Gummies     rosuvastatin  (CRESTOR ) 20 MG tablet TAKE 1 TABLET BY MOUTH EVERY DAY 90 tablet 3   Current Facility-Administered Medications  Medication Dose Route Frequency Provider Last Rate Last Admin   methylPREDNISolone  acetate (DEPO-MEDROL ) injection 80 mg  80 mg Intramuscular Once Leonce Katz, DO        Allergies as of 06/26/2024 - Review Complete 06/26/2024  Allergen Reaction Noted   Penicillins Swelling and Other (See Comments) 08/27/2012   Shellfish allergy Anaphylaxis 01/29/2020   Latex Hives and Rash 08/27/2012   Banana Nausea Only 08/15/2018   Lactose intolerance (gi) Diarrhea and Nausea  And Vomiting 07/21/2021   Oxycodone  Other (See Comments) 12/08/2019   Dorethia [aspirin ] Nausea And Vomiting 08/27/2012   Celebrex [celecoxib] Nausea And Vomiting 01/03/2013   Kiwi extract Rash 11/02/2020   Nsaids Rash 01/03/2013    Family History  Problem Relation Age of Onset   Heart disease Father    Diabetes Mother    Heart disease Mother        pacemaker   Heart attack Mother 79   Diabetes Sister    Alcohol abuse Other    Heart disease Other    Hyperlipidemia Other    Hypertension Other    Diabetes Other    Breast cancer Other    Colon cancer Neg Hx    Esophageal cancer Neg Hx    Stomach cancer Neg Hx    Rectal cancer Neg Hx     Social History   Socioeconomic History   Marital status: Single    Spouse name: Not on file   Number of children: 2   Years of education: Not on file   Highest education level: Associate degree: occupational, Scientist, product/process development, or vocational program  Occupational History   Occupation: retired  Tobacco Use   Smoking status: Never   Smokeless tobacco: Never  Vaping Use   Vaping status: Never Used  Substance and Sexual Activity   Alcohol use: Not Currently   Drug use: Never   Sexual activity: Not Currently  Other Topics Concern   Not on file  Social History Narrative   Not on file   Social Drivers of Health   Financial Resource Strain: Low Risk  (01/28/2024)   Overall Financial Resource Strain (CARDIA)    Difficulty of Paying Living Expenses: Not hard at all  Recent Concern: Financial Resource Strain - Medium Risk (11/11/2023)   Overall Financial Resource Strain (CARDIA)    Difficulty of Paying Living Expenses: Somewhat hard  Food Insecurity: No Food Insecurity (01/28/2024)   Hunger Vital Sign    Worried About Running Out of Food in the Last Year: Never true    Ran Out of Food in the Last Year: Never true  Recent Concern: Food Insecurity - Food Insecurity Present (11/11/2023)   Hunger Vital Sign    Worried About Running Out of Food in the Last  Year: Often true    Ran Out of Food in the Last Year: Often true  Transportation Needs: No Transportation Needs (01/28/2024)   PRAPARE - Transportation    Lack of Transportation (  Medical): No    Lack of Transportation (Non-Medical): No  Physical Activity: Sufficiently Active (01/28/2024)   Exercise Vital Sign    Days of Exercise per Week: 3 days    Minutes of Exercise per Session: 60 min  Recent Concern: Physical Activity - Insufficiently Active (11/11/2023)   Exercise Vital Sign    Days of Exercise per Week: 3 days    Minutes of Exercise per Session: 30 min  Stress: No Stress Concern Present (01/28/2024)   [employer] of Occupational Health - Occupational Stress Questionnaire    Feeling of Stress : Not at all  Social Connections: Moderately Integrated (01/28/2024)   Social Connection and Isolation Panel    Frequency of Communication with Friends and Family: More than three times a week    Frequency of Social Gatherings with Friends and Family: More than three times a week    Attends Religious Services: More than 4 times per year    Active Member of [employer] or Organizations: Yes    Attends Engineer, structural: More than 4 times per year    Marital Status: Divorced  Intimate Partner Violence: Not At Risk (01/28/2024)   Humiliation, Afraid, Rape, and Kick questionnaire    Fear of Current or Ex-Partner: No    Emotionally Abused: No    Physically Abused: No    Sexually Abused: No    Review of Systems:    Constitutional: No weight loss, fever or chills Cardiovascular: No chest pain Respiratory: No SOB Gastrointestinal: See HPI and otherwise negative   Physical Exam:  Vital signs: BP 126/72 (BP Location: Right Arm, Patient Position: Sitting, Cuff Size: Normal)   Pulse 71   Ht 5' 4 (1.626 m)   Wt 161 lb (73 kg)   BMI 27.64 kg/m   Constitutional:   Pleasant AA female appears to be in NAD, Well developed, Well nourished, alert and cooperative Respiratory: Respirations even  and unlabored. Lungs clear to auscultation bilaterally.   No wheezes, crackles, or rhonchi.  Cardiovascular: Normal S1, S2. No MRG. Regular rate and rhythm. No peripheral edema, cyanosis or pallor.  Gastrointestinal:  Soft, nondistended, nontender. No rebound or guarding. Normal bowel sounds. No appreciable masses or hepatomegaly. Psychiatric: Demonstrates good judgement and reason without abnormal affect or behaviors.  RELEVANT LABS AND see HPI: CBC    Component Value Date/Time   WBC 7.0 05/16/2024 2140   RBC 3.67 (L) 05/16/2024 2140   HGB 10.4 (L) 05/16/2024 2140   HGB 9.9 (L) 09/09/2021 1009   HGB 9.5 (L) 09/13/2018 1126   HCT 33.4 (L) 05/16/2024 2140   HCT 30.4 (L) 09/13/2018 1126   PLT 293 05/16/2024 2140   PLT 267 09/09/2021 1009   PLT 390 09/13/2018 1126   MCV 91.0 05/16/2024 2140   MCV 86 09/13/2018 1126   MCH 28.3 05/16/2024 2140   MCHC 31.1 05/16/2024 2140   RDW 14.9 05/16/2024 2140   RDW 16.0 (H) 09/13/2018 1126   LYMPHSABS 2.0 05/16/2024 2140   MONOABS 0.5 05/16/2024 2140   EOSABS 0.2 05/16/2024 2140   BASOSABS 0.0 05/16/2024 2140    CMP     Component Value Date/Time   NA 139 05/16/2024 2140   NA 142 02/20/2024 1437   K 3.6 05/16/2024 2140   CL 105 05/16/2024 2140   CO2 24 05/16/2024 2140   GLUCOSE 105 (H) 05/16/2024 2140   BUN 18 05/16/2024 2140   BUN 15 02/20/2024 1437   CREATININE 1.10 (H) 05/16/2024 2140   CREATININE 1.13 (  H) 05/13/2021 1419   CALCIUM  9.0 05/16/2024 2140   CALCIUM  9.6 12/25/2023 1309   PROT 7.1 05/16/2024 2140   PROT 7.1 02/20/2024 1437   ALBUMIN  3.2 (L) 05/16/2024 2140   ALBUMIN  4.3 02/20/2024 1437   AST 18 05/16/2024 2140   AST 19 05/13/2021 1419   ALT 10 05/16/2024 2140   ALT 14 05/13/2021 1419   ALKPHOS 92 05/16/2024 2140   BILITOT 0.8 05/16/2024 2140   BILITOT 0.6 02/20/2024 1437   BILITOT 0.4 05/13/2021 1419   GFRNONAA 54 (L) 05/16/2024 2140   GFRNONAA 53 (L) 05/13/2021 1419   GFRAA >60 01/29/2020 1134     Assessment: 1.  Chronic constipation: Thought slow transit or related to pelvic floor dysfunction in the past, has been better with Linzess  72 mcg until just recently, does not seem to be working as well now 2.  History of diverticulitis: Recent diagnosis in the ED via CT in July, treated with Cipro  and Flagyl  and now no further symptoms, last colonoscopy in 2020 with 1 adenomatous polyp and repeat recommended in 7 years, CT questioned underlying abnormality 3.  Blood in stool: Resolved after treatment of diverticulitis, most likely related to this +/- hemorrhoids  Plan: 1.  Scheduled patient for diagnostic colonoscopy given history of diverticulitis and abnormal CT with rectal bleeding.  This is scheduled with Dr. Legrand in the [facility].  Did provide the patient with a detailed list of risks for the procedure and she agrees to proceed. Patient is appropriate for endoscopic procedure(s) in the ambulatory ([facility]) setting.  2.  Patient will have a 2-day bowel prep given history of constipation. 3.  Increase Linzess  to 145 mcg daily prescribe #30 with 11 refills 4.  Patient to follow-up in clinic per recommendations from Dr. Legrand after time of procedure.  Delon Failing, PA-C [facility] 06/26/2024, 8:31 AM  Cc: [name], MD

## 2024-06-26 NOTE — Patient Instructions (Addendum)
 We have sent the following medications to your pharmacy for you to pick up at your convenience: Linzess  145 mcg daily before breakfast.   You have been scheduled for a colonoscopy. Please follow written instructions given to you at your visit today.   If you use inhalers (even only as needed), please bring them with you on the day of your procedure.  DO NOT TAKE 7 DAYS PRIOR TO TEST- Trulicity (dulaglutide) Ozempic, Wegovy (semaglutide) Mounjaro (tirzepatide) Bydureon Bcise (exanatide extended release)  DO NOT TAKE 1 DAY PRIOR TO YOUR TEST Rybelsus (semaglutide) Adlyxin (lixisenatide) Victoza (liraglutide) Byetta (exanatide) ________________________________________________________________________

## 2024-06-30 ENCOUNTER — Telehealth: Payer: Self-pay | Admitting: *Deleted

## 2024-06-30 NOTE — Telephone Encounter (Signed)
 Copied from CRM [id number]. Topic: General - Other >> Jun 30, 2024  2:09 PM Burnard DEL wrote: Reason for CRM: Patient would like to know if she could come by and pick up a copy of the application for the handicap placard the original  copy to take to Dr Louis her neurologist office.A copy was sent over to them but it was to blurry to complete. Please call patient to advise of this .

## 2024-07-01 ENCOUNTER — Telehealth: Payer: Self-pay | Admitting: *Deleted

## 2024-07-01 DIAGNOSIS — G4733 Obstructive sleep apnea (adult) (pediatric): Secondary | ICD-10-CM

## 2024-07-01 DIAGNOSIS — G4730 Sleep apnea, unspecified: Secondary | ICD-10-CM

## 2024-07-01 NOTE — Telephone Encounter (Signed)
 Spoke with patient and let her know that original was shredded and patient is able to print a copy off of DMV website

## 2024-07-01 NOTE — Telephone Encounter (Signed)
 The patient has been notified of the result and verbalized understanding.  All questions (if any) were answered. Samantha Stevenson, CMA 07/01/2024 4:27 PM     Precert titration

## 2024-07-01 NOTE — Telephone Encounter (Signed)
-----   Message from Wilbert Bihari sent at 05/13/2024 10:53 AM EDT ----- Please let patient know that they have sleep apnea.  Recommend therapeutic CPAP titration for treatment of patient's sleep disordered breathing.

## 2024-07-02 NOTE — Progress Notes (Signed)
 ____________________________________________________________  Attending physician addendum:  Thank you for sending this case to me. I have reviewed the entire note and agree with the plan.  Calcium  channel blocker, diuretics, diverticulosis are likely contributing to the constipation.  Victory Brand, MD  ____________________________________________________________

## 2024-07-02 NOTE — Telephone Encounter (Addendum)
**Note De-Identified Areli Frary Obfuscation** Per the Medicare Part A and B, a PA is not required for CPT Code: 04188 (CPAP Titration):  I have transferred the order to the sleep lab.  I called the pt but got no answer so I left a message on her Cell phone VM (Ok per [facility]) advising her of this outcome and I provided her with the Sleep Lab's phone number so she can call them to be scheduled.  I did leave my name and the office phone number so she can call us  back if she has any questions or concerns for us .

## 2024-07-28 ENCOUNTER — Ambulatory Visit: Admitting: Gastroenterology

## 2024-07-28 ENCOUNTER — Encounter: Payer: Self-pay | Admitting: Gastroenterology

## 2024-07-28 VITALS — BP 112/71 | HR 78 | Temp 97.6°F | Resp 16 | Ht 64.0 in | Wt 161.0 lb

## 2024-07-28 DIAGNOSIS — D122 Benign neoplasm of ascending colon: Secondary | ICD-10-CM

## 2024-07-28 DIAGNOSIS — K648 Other hemorrhoids: Secondary | ICD-10-CM | POA: Diagnosis not present

## 2024-07-28 DIAGNOSIS — Z1211 Encounter for screening for malignant neoplasm of colon: Secondary | ICD-10-CM | POA: Diagnosis not present

## 2024-07-28 DIAGNOSIS — Q439 Congenital malformation of intestine, unspecified: Secondary | ICD-10-CM | POA: Diagnosis not present

## 2024-07-28 DIAGNOSIS — K5730 Diverticulosis of large intestine without perforation or abscess without bleeding: Secondary | ICD-10-CM | POA: Diagnosis not present

## 2024-07-28 DIAGNOSIS — I4891 Unspecified atrial fibrillation: Secondary | ICD-10-CM | POA: Diagnosis not present

## 2024-07-28 DIAGNOSIS — D123 Benign neoplasm of transverse colon: Secondary | ICD-10-CM

## 2024-07-28 DIAGNOSIS — I252 Old myocardial infarction: Secondary | ICD-10-CM | POA: Diagnosis not present

## 2024-07-28 DIAGNOSIS — Z8719 Personal history of other diseases of the digestive system: Secondary | ICD-10-CM | POA: Diagnosis not present

## 2024-07-28 DIAGNOSIS — K625 Hemorrhage of anus and rectum: Secondary | ICD-10-CM

## 2024-07-28 DIAGNOSIS — K6289 Other specified diseases of anus and rectum: Secondary | ICD-10-CM

## 2024-07-28 DIAGNOSIS — K5909 Other constipation: Secondary | ICD-10-CM | POA: Diagnosis not present

## 2024-07-28 DIAGNOSIS — R7303 Prediabetes: Secondary | ICD-10-CM | POA: Diagnosis not present

## 2024-07-28 DIAGNOSIS — I10 Essential (primary) hypertension: Secondary | ICD-10-CM | POA: Diagnosis not present

## 2024-07-28 DIAGNOSIS — I509 Heart failure, unspecified: Secondary | ICD-10-CM | POA: Diagnosis not present

## 2024-07-28 MED ORDER — SODIUM CHLORIDE 0.9 % IV SOLN: 500.0000 mL | INTRAVENOUS | Status: AC

## 2024-07-28 NOTE — Progress Notes (Signed)
 History and Physical:  This patient presents for endoscopic testing for: Encounter Diagnoses  Name Primary?   History of diverticulitis Yes   Rectal bleeding    Chronic constipation     73 year old woman known to me here today for colonoscopy to evaluate symptoms noted above. Clinical details are in the 06/26/2024 office note by our APP.  She has chronic constipation and also had some rectal bleeding during an episode of sigmoid diverticulitis in July of this year.  Patient is otherwise without complaints or active issues today.   Past Medical History: Past Medical History:  Diagnosis Date   Allergy    Anemia    Arthritis    qwhere (07/29/2018)   Atrial fibrillation (HCC)    CHF (congestive heart failure) (HCC)    Clotting disorder    Degenerative arthritis of hip    s/p L THR 12/2012   Dyspnea    since surgery in August 2019- when I get worked up and walk a short distance   Dysrhythmia    History of blood transfusion 05/2018   related to OR   Hyperlipidemia    Hypertension    Intramural hematoma of thoracic aorta (HCC) 06/13/2018   Migraines    mIgraines- none since blood pressure and lipids are under control   Myocardial infarction (HCC) 2000   due to atrial fib   Neuromuscular disorder (HCC)    pinched nerve- left side of neck   Pre-diabetes    Pulmonary emboli (HCC) 12/2012 dx   post op (L THR), anticoag x 53mo     Past Surgical History: Past Surgical History:  Procedure Laterality Date   ABDOMINAL HYSTERECTOMY     w/1 tube   ANTERIOR CERVICAL DECOMP/DISCECTOMY FUSION N/A 01/21/2024   Procedure: ANTERIOR CERVICAL DECOMPRESSION/DISCECTOMY FUSION CERVICAL TWO-CERVICAL THREE;  Surgeon: Louis Shove, MD;  Location: [facility] OR;  Service: Neurosurgery;  Laterality: N/A;  ACDF - C2-C3   ANTERIOR CERVICAL DECOMPRESSION/DISCECTOMY FUSION 4 LEVELS N/A 02/02/2020   Procedure: CERVICAL THREE-FOUR, CERVICAL FOUR-FIVE, CERVICAL FIVE-SIX, CERVICAL SIX-SEVEN ANTERIOR CERVICAL  DECOMPRESSION/DISCECTOMY FUSION;  Surgeon: Louis Shove, MD;  Location: [facility] OR;  Service: Neurosurgery;  Laterality: N/A;   ARTERY REPAIR Left 08/15/2018   Procedure: LEFT BRACHIAL ARTERY EXPLORATION;  Surgeon: Sheree Penne Bruckner, MD;  Location: [facility] OR;  Service: Vascular;  Laterality: Left;   Breast Ultrasound Left 04/16/2013   Done @ breast center Impression: no malignancy appearance noted on the screen study is consistent with a summation shadow   BUNIONECTOMY Left    CARDIAC CATHETERIZATION     CAROTID-SUBCLAVIAN BYPASS GRAFT Left 06/13/2018   Procedure: BYPASS GRAFT CAROTID-SUBCLAVIAN;  Surgeon: Sheree Penne Bruckner, MD;  Location: [facility] OR;  Service: Vascular;  Laterality: Left;   COLONOSCOPY     CYSTOSCOPY WITH RETROGRADE PYELOGRAM, URETEROSCOPY AND STENT PLACEMENT Bilateral 11/02/2020   Procedure: DIAGNOSTIC CYSTOSCOPY WITH RETROGRADE PYELOGRAM,BILATERAL DIAGNOSTIC URETEROSCOPY  AND BILATERAL STENT PLACEMENT;  Surgeon: Elisabeth Valli BIRCH, MD;  Location: [facility] [place];  Service: Urology;  Laterality: Bilateral;  90 MINS   DG TUMB RIGHT HAND Right    cyst removal   IR THORACENTESIS ASP PLEURAL SPACE W/IMG GUIDE  05/31/2018   JOINT REPLACEMENT Left    KNEE ARTHROSCOPY Left 12/16/2020   Procedure: LEFT KNEE ARTHROSCOPY WITH PARTIAL LATERAL MENISCECTOMY;  Surgeon: Vernetta Bruckner GRADE, MD;  Location: [facility];  Service: Orthopedics;  Laterality: Left;   THORACIC AORTIC ENDOVASCULAR STENT GRAFT Left 06/13/2018   Procedure: LEFT  SUBCLAVIAN TO CAROTID ARTERY TRANSPOSITION;  THORACIC AORTIC ENDOVASCULAR STENT GRAFT using GORE CONFORMABLE THORACIC STENT GRAFT AND GORE TAG THORACIC ENDOPROSTHESIS; STENT LEFT COMMON CAROTID ARTERY, RIGHT COMMON-FEMORAL ENDARTERECTOMY WITH PATCH ANGIOPLASTY;  Surgeon: Sheree Penne Bruckner, MD;  Location: [facility] OR;  Service: Vascular;  Laterality: Left;   TONSILLECTOMY     TOTAL HIP ARTHROPLASTY Left 01/03/2013   Procedure:  TOTAL HIP ARTHROPLASTY ANTERIOR APPROACH;  Surgeon: Bruckner CINDERELLA Poli, MD;  Location: [facility] ORS;  Service: Orthopedics;  Laterality: Left;  Left Total Hip Arthroplasty, Anterior Approach   TUBAL LIGATION     VAGINA RECONSTRUCTION SURGERY  1966   vagina had closed up when 73 years old   WOUND EXPLORATION Left 06/16/2018   Procedure: LEFT NECK WOUND EXPLORATION, CHEST TUBE INSERTION;  Surgeon: Sheree Penne Bruckner, MD;  Location: [facility] OR;  Service: Vascular;  Laterality: Left;    Allergies: Allergies  Allergen Reactions   Penicillins Swelling and Other (See Comments)    Severe swelling   Shellfish Allergy Anaphylaxis   Latex Hives and Rash   Dorethia Carly.Carder ] Nausea And Vomiting    Makes stomach upset Asprin 325   Banana Nausea Only   Celebrex [Celecoxib] Nausea And Vomiting    GI upset   Kiwi Extract Rash   Lactose Intolerance (Gi) Diarrhea and Nausea And Vomiting   Nsaids Rash   Oxycodone  Other (See Comments)    Pt stated, Made me feel really drowsy Tolerating as of 02/16/20 after neck surgery    Outpatient Meds: Current Outpatient Medications  Medication Sig Dispense Refill   dapagliflozin  propanediol (FARXIGA ) 10 MG TABS tablet Take 10 mg by mouth daily.     diltiazem  (DILACOR XR ) 240 MG 24 hr capsule Take 1 capsule (240 mg total) by mouth every morning. 90 capsule 3   mirabegron  ER (MYRBETRIQ ) 25 MG TB24 tablet Take 1 tablet (25 mg total) by mouth daily. 90 tablet 3   Multiple Vitamin (MULTIVITAMIN) tablet Take 2 tablets by mouth daily. Gummies     rosuvastatin  (CRESTOR ) 20 MG tablet TAKE 1 TABLET BY MOUTH EVERY DAY 90 tablet 3   acetaminophen  (TYLENOL ) 500 MG tablet Take 1,000 mg by mouth every 8 (eight) hours as needed for moderate pain.     aspirin  EC 81 MG tablet Take 81 mg by mouth daily.     clindamycin  (CLEOCIN  T) 1 % lotion Apply 1 application  topically as needed (acne).     cyclobenzaprine  (FLEXERIL ) 10 MG tablet Take 1 tablet (10 mg total) by mouth 3 (three)  times daily as needed for muscle spasms. 30 tablet 0   ferrous sulfate  325 (65 FE) MG tablet Take 325 mg by mouth once a week.     fluticasone  (CUTIVATE ) 0.05 % cream Apply 1 Application topically daily. (Patient not taking: Reported on 07/28/2024)     furosemide  (LASIX ) 20 MG tablet Take 1 tablet (20 mg total) by mouth at bedtime. (Patient not taking: Reported on 07/28/2024) 90 tablet 3   linaclotide  (LINZESS ) 145 MCG CAPS capsule Take 1 capsule (145 mcg total) by mouth daily before breakfast. 30 capsule 3   metroNIDAZOLE  (METROCREAM ) 0.75 % cream Apply 1 application  topically as needed (acne). (Patient not taking: Reported on 07/28/2024)     Current Facility-Administered Medications  Medication Dose Route Frequency Provider Last Rate Last Admin   0.9 %  sodium chloride  infusion  500 mL Intravenous Continuous Danis, Victory CROME III, MD       methylPREDNISolone  acetate (DEPO-MEDROL ) injection 80 mg  80 mg Intramuscular Once Leonce Katz,  DO          ___________________________________________________________________ Objective   Exam:  BP 128/72   Pulse 79   Temp 97.6 F (36.4 C)   Ht 5' 4 (1.626 m)   Wt 161 lb (73 kg)   SpO2 97%   BMI 27.64 kg/m   CV: regular , S1/S2 Resp: clear to auscultation bilaterally, normal RR and effort noted GI: soft, no tenderness, with active bowel sounds.   Assessment: Encounter Diagnoses  Name Primary?   History of diverticulitis Yes   Rectal bleeding    Chronic constipation      Plan: Colonoscopy   The benefits and risks of the planned procedure(s) were described in detail with the patient or (when appropriate) their health care proxy.  Risks were outlined as including, but not limited to, bleeding, infection, perforation, adverse medication reaction leading to cardiac or pulmonary decompensation, pancreatitis (if ERCP).  The limitation of incomplete mucosal visualization was also discussed.  No guarantees or warranties were given.  The  patient is appropriate for an endoscopic procedure in the ambulatory setting.   - Victory Brand, MD

## 2024-07-28 NOTE — Op Note (Signed)
 [facility] Patient Name: Samantha Stevenson Procedure Date: 07/28/2024 1:37 PM MRN: [id number] Endoscopist: Victory L. Legrand , MD, [id number] Age: 73 Referring MD:  Date of Birth: August 10, 1951 Gender: Female Account #: [id number] Procedure:                Colonoscopy Indications:              Rectal bleeding (self-limited during an episode of                            diverticulitis in July), Follow-up of                            diverticulitis, Constipation                           8 mm right colon TA November 2020 Medicines:                Monitored Anesthesia Care Procedure:                Pre-Anesthesia Assessment:                           - Prior to the procedure, a History and Physical                            was performed, and patient medications and                            allergies were reviewed. The patient's tolerance of                            previous anesthesia was also reviewed. The risks                            and benefits of the procedure and the sedation                            options and risks were discussed with the patient.                            All questions were answered, and informed consent                            was obtained. Prior Anticoagulants: The patient has                            taken no anticoagulant or antiplatelet agents. ASA                            Grade Assessment: III - A patient with severe                            systemic disease. After reviewing the risks and  benefits, the patient was deemed in satisfactory                            condition to undergo the procedure.                           After obtaining informed consent, the colonoscope                            was passed under direct vision. Throughout the                            procedure, the patient's blood pressure, pulse, and                            oxygen saturations were monitored continuously. The                             Olympus Scope SN [id number] was introduced through the                            anus and advanced to the the cecum, identified by                            appendiceal orifice and ileocecal valve. The                            colonoscopy was performed with difficulty due to                            multiple diverticula in the colon, a redundant                            colon and a tortuous colon. Successful completion                            of the procedure was aided by using manual                            pressure, straightening and shortening the scope to                            obtain bowel loop reduction and lavage. The patient                            tolerated the procedure well. The quality of the                            bowel preparation was excellent. The ileocecal                            valve, appendiceal orifice, and rectum were  photographed. Scope In: 1:55:13 PM Scope Out: 2:14:53 PM Scope Withdrawal Time: 0 hours 8 minutes 43 seconds  Total Procedure Duration: 0 hours 19 minutes 40 seconds  Findings:                 The digital rectal exam findings include decreased                            sphincter tone.                           Repeat examination of right colon under NBI                            performed.                           A 6 mm polyp was found in the transverse colon. The                            polyp was pedunculated. The polyp was removed with                            a hot snare (during scope insertion). Resection and                            retrieval were complete. (Jar 1)                           A diminutive polyp was found in the ascending                            colon. The polyp was flat. The polyp was removed                            with a cold biopsy forceps. Resection and retrieval                            were complete. (Jar 2)                            Many diverticula were found in the entire colon.                            Associated sigmoid tortuosity                           Internal hemorrhoids were found. The hemorrhoids                            were small.                           The exam was otherwise without abnormality on  direct and retroflexion views. Complications:            No immediate complications. Estimated Blood Loss:     Estimated blood loss was minimal. Impression:               - Decreased sphincter tone found on digital rectal                            exam.                           - One 6 mm polyp in the transverse colon, removed                            with a hot snare. Resected and retrieved.                           - One diminutive polyp in the ascending colon,                            removed with a cold biopsy forceps. Resected and                            retrieved.                           - Diverticulosis in the entire examined colon.                           - Internal hemorrhoids.                           - The examination was otherwise normal on direct                            and retroflexion views. Recommendation:           - Patient has a contact number available for                            emergencies. The signs and symptoms of potential                            delayed complications were discussed with the                            patient. Return to normal activities tomorrow.                            Written discharge instructions were provided to the                            patient.                           - Resume previous diet.                           -  Continue present medications. Consider a trial of                            increased Linzess  dose prescribed at the recent                            office visit.                           - Await pathology results.                           - Repeat colonoscopy may be  recommended for                            surveillance. That will be determined after                            pathology results from today's exam become                            available for review. Gautham Hewins L. Legrand, MD 07/28/2024 2:24:12 PM This report has been signed electronically.

## 2024-07-28 NOTE — Patient Instructions (Signed)
 YOU HAD AN ENDOSCOPIC PROCEDURE TODAY AT THE [facility]:   Refer to the procedure report that was given to you for any specific questions about what was found during the examination.  If the procedure report does not answer your questions, please call your gastroenterologist to clarify.  If you requested that your care partner not be given the details of your procedure findings, then the procedure report has been included in a sealed envelope for you to review at your convenience later.  YOU SHOULD EXPECT: Some feelings of bloating in the abdomen. Passage of more gas than usual.  Walking can help get rid of the air that was put into your GI tract during the procedure and reduce the bloating. If you had a lower endoscopy (such as a colonoscopy or flexible sigmoidoscopy) you may notice spotting of blood in your stool or on the toilet paper. If you underwent a bowel prep for your procedure, you may not have a normal bowel movement for a few days.  Please Note:  You might notice some irritation and congestion in your nose or some drainage.  This is from the oxygen used during your procedure.  There is no need for concern and it should clear up in a day or so.  SYMPTOMS TO REPORT IMMEDIATELY:  Following lower endoscopy (colonoscopy or flexible sigmoidoscopy):  Excessive amounts of blood in the stool  Significant tenderness or worsening of abdominal pains  Swelling of the abdomen that is new, acute  Fever of 100F or higher   For urgent or emergent issues, a gastroenterologist can be reached at any hour by calling [phone]. Do not use MyChart messaging for urgent concerns.    DIET:  We do recommend a small meal at first, but then you may proceed to your regular diet.  Drink plenty of fluids but you should avoid alcoholic beverages for 24 hours.  MEDICATIONS: Continue present medications. Consider a trial of increased Linzess  dose prescribed at the recent office visit.  FOLLOW UP:  Repeat colonoscopy may re recommended for surveillance. That will be determined after pathology results from today's exam become available for review.  Educational handouts given to patient: Polyps, Diverticulosis, Hemorrhoids.  Thank you for allowing us  to provide for your healthcare needs today.  ACTIVITY:  You should plan to take it easy for the rest of today and you should NOT DRIVE or use heavy machinery until tomorrow (because of the sedation medicines used during the test).    FOLLOW UP: Our staff will call the number listed on your records the next business day following your procedure.  We will call around 7:15- 8:00 am to check on you and address any questions or concerns that you may have regarding the information given to you following your procedure. If we do not reach you, we will leave a message.     If any biopsies were taken you will be contacted by phone or by letter within the next 1-3 weeks.  Please call us  at [phone] if you have not heard about the biopsies in 3 weeks.    SIGNATURES/CONFIDENTIALITY: You and/or your care partner have signed paperwork which will be entered into your electronic medical record.  These signatures attest to the fact that that the information above on your After Visit Summary has been reviewed and is understood.  Full responsibility of the confidentiality of this discharge information lies with you and/or your care-partner.

## 2024-07-28 NOTE — Progress Notes (Signed)
 Called to room to assist during endoscopic procedure.  Patient ID and intended procedure confirmed with present staff. Received instructions for my participation in the procedure from the performing physician.

## 2024-07-28 NOTE — Progress Notes (Signed)
 Sedate, gd SR, tolerated procedure well, VSS, report to RN

## 2024-07-29 ENCOUNTER — Telehealth: Payer: Self-pay | Admitting: *Deleted

## 2024-07-29 NOTE — Telephone Encounter (Signed)
  Follow up Call-     07/28/2024    1:17 PM  Call back number  Post procedure Call Back phone  # [phone]  Permission to leave phone message Yes     Patient questions:  Do you have a fever, pain , or abdominal swelling? No. Pain Score  0 *  Have you tolerated food without any problems? Yes.    Have you been able to return to your normal activities? Yes.    Do you have any questions about your discharge instructions: Diet   No. Medications  No. Follow up visit  No.  Do you have questions or concerns about your Care? No.  Actions: * If pain score is 4 or above: No action needed, pain <4.

## 2024-07-31 LAB — SURGICAL PATHOLOGY

## 2024-08-01 ENCOUNTER — Ambulatory Visit: Payer: Self-pay

## 2024-08-01 NOTE — Telephone Encounter (Signed)
Appt 10/13

## 2024-08-01 NOTE — Telephone Encounter (Signed)
 FYI Only or Action Required?: FYI only for provider.  Patient was last seen in primary care on 01/03/2024 by Samantha Clotilda SAUNDERS, MD.  Called Nurse Triage reporting Ear Fullness.  Symptoms began several days ago.  Interventions attempted: OTC medications: tylenol .  Symptoms are: unchanged.  Triage Disposition: See PCP When Office is Open (Within 3 Days) (overriding See Physician Within 24 Hours)  Patient/caregiver understands and will follow disposition?: Yes  Copied from CRM [id number]. Topic: Clinical - Red Word Triage >> Aug 01, 2024 10:11 AM [name] wrote: [facility] that prompted transfer to Nurse Triage: Pt said she had a bad reaction after pressing something in her ear. She is having pain in her L ear, throat, and headaches. Warm transfer to NT. Reason for Disposition  Earache  (Exceptions: Brief ear pain of lasting less than 60 minutes, or earache occurring during air travel.)  Answer Assessment - Initial Assessment Questions Pt states L ear popped inside and had severe pain to ear 2-3 days ago. Has had throat pain and HA's since then. Attempted to call ENT, but needs referral.   1. LOCATION: Which ear is involved?     L ear 2. ONSET: When did the ear pain start?      2-3 days  3. SEVERITY: How bad is the pain?  (Scale 1-10; mild, moderate or severe)     5/10 4. URI SYMPTOMS: Do you have a runny nose or cough?     Admits to runny nose and cough since ear popped 5. FEVER: Do you have a fever? If Yes, ask: What is your temperature, how was it measured, and when did it start?     denies 6. CAUSE: Have you been swimming recently?, How often do you use Q-TIPS?, Have you had any recent air travel or scuba diving?     Was wiggling external part of her ear  7. OTHER SYMPTOMS: Do you have any other symptoms? (e.g., decreased hearing, dizziness, headache, stiff neck, vomiting)     Admits to HA  Protocols used: Rilla

## 2024-08-04 ENCOUNTER — Encounter: Payer: Self-pay | Admitting: Family Medicine

## 2024-08-04 ENCOUNTER — Ambulatory Visit (INDEPENDENT_AMBULATORY_CARE_PROVIDER_SITE_OTHER): Admitting: Family Medicine

## 2024-08-04 VITALS — BP 122/78 | HR 75 | Temp 97.1°F | Ht 64.0 in | Wt 160.0 lb

## 2024-08-04 DIAGNOSIS — H6993 Unspecified Eustachian tube disorder, bilateral: Secondary | ICD-10-CM | POA: Diagnosis not present

## 2024-08-04 DIAGNOSIS — J309 Allergic rhinitis, unspecified: Secondary | ICD-10-CM | POA: Diagnosis not present

## 2024-08-04 DIAGNOSIS — R0982 Postnasal drip: Secondary | ICD-10-CM

## 2024-08-04 NOTE — Progress Notes (Addendum)
 Established Patient Office Visit   Subjective  Patient ID: Samantha Stevenson, female    DOB: [birth year]  Age: 73 y.o. MRN: [id number]  Chief Complaint  Patient presents with   Acute Visit    Bilateral ears feel as if something is in them Left ear is painful and twice it has felt like the Bone fell inside the ear and was a 10/10 pain Headaches AM sore throat x 2 weeks    Patient is a 73 year old female seen for ongoing concern.  Patient endorses left ear pain x 1 month.  Symptoms resolved and came back last week.  Feels like something is in the ear.  Patient also mentions pressing on right ear then experiencing left ear discomfort.  Patient describes pain as intense 10/10 when initially started.  Patient also endorses increased annoying headaches, scratchy throat, cough, rhinorrhea.  Patient unsure if she has allergies.  Tried Tylenol  for symptoms.  Denies fever, chills, nausea, vomiting.    Patient Active Problem List   Diagnosis Date Noted   Stenosis of cervical spine with myelopathy (HCC) 01/21/2024   History of renal calculi 01/07/2024   Urinary frequency 09/07/2023   Overactive bladder 09/07/2023   Urinary tract infection without hematuria 09/07/2023   Prolapse of posterior vaginal wall 09/05/2023   Bilateral arm pain 04/19/2022   Other fatigue 04/19/2022   Chronic diastolic CHF (congestive heart failure) (HCC) 11/24/2020   Acute lateral meniscal tear, left, subsequent encounter 11/16/2020   Cervical spondylosis with myelopathy and radiculopathy 02/02/2020   Chronic neck pain 07/17/2019   LLQ pain 05/16/2019   Chronic bilateral low back pain without sciatica 10/07/2018   Pain and swelling of left lower leg 08/08/2018   Hematuria, gross 07/29/2018   Thoracic aortic dissection (HCC) 06/13/2018   Intramural aortic hematoma (HCC) 05/28/2018   Bilateral leg edema 02/27/2018   Routine general medical examination at a health care facility 06/24/2015   Constipation 03/17/2015    Myalgia and myositis 03/17/2015   Dyslipidemia    Atrial fibrillation (HCC)    Hypertension    History of pulmonary embolism 01/10/2013   Anemia 01/10/2013   Arthritis 01/03/2013   Past Medical History:  Diagnosis Date   Allergy    Anemia    Arthritis    qwhere (07/29/2018)   Atrial fibrillation (HCC)    CHF (congestive heart failure) (HCC)    Clotting disorder    Degenerative arthritis of hip    s/p L THR 12/2012   Dyspnea    since surgery in August 2019- when I get worked up and walk a short distance   Dysrhythmia    History of blood transfusion 05/2018   related to OR   Hyperlipidemia    Hypertension    Intramural hematoma of thoracic aorta (HCC) 06/13/2018   Migraines    mIgraines- none since blood pressure and lipids are under control   Myocardial infarction (HCC) 2000   due to atrial fib   Neuromuscular disorder (HCC)    pinched nerve- left side of neck   Pre-diabetes    Pulmonary emboli (HCC) 12/2012 dx   post op (L THR), anticoag x 65mo   Past Surgical History:  Procedure Laterality Date   ABDOMINAL HYSTERECTOMY     w/1 tube   ANTERIOR CERVICAL DECOMP/DISCECTOMY FUSION N/A 01/21/2024   Procedure: ANTERIOR CERVICAL DECOMPRESSION/DISCECTOMY FUSION CERVICAL TWO-CERVICAL THREE;  Surgeon: Louis Shove, MD;  Location: [facility] OR;  Service: Neurosurgery;  Laterality: N/A;  ACDF - C2-C3   ANTERIOR CERVICAL  DECOMPRESSION/DISCECTOMY FUSION 4 LEVELS N/A 02/02/2020   Procedure: CERVICAL THREE-FOUR, CERVICAL FOUR-FIVE, CERVICAL FIVE-SIX, CERVICAL SIX-SEVEN ANTERIOR CERVICAL DECOMPRESSION/DISCECTOMY FUSION;  Surgeon: Louis Shove, MD;  Location: [facility] OR;  Service: Neurosurgery;  Laterality: N/A;   ARTERY REPAIR Left 08/15/2018   Procedure: LEFT BRACHIAL ARTERY EXPLORATION;  Surgeon: Sheree Penne Bruckner, MD;  Location: [facility] OR;  Service: Vascular;  Laterality: Left;   Breast Ultrasound Left 04/16/2013   Done @ breast center Impression: no malignancy appearance noted on the  screen study is consistent with a summation shadow   BUNIONECTOMY Left    CARDIAC CATHETERIZATION     CAROTID-SUBCLAVIAN BYPASS GRAFT Left 06/13/2018   Procedure: BYPASS GRAFT CAROTID-SUBCLAVIAN;  Surgeon: Sheree Penne Bruckner, MD;  Location: [facility] OR;  Service: Vascular;  Laterality: Left;   COLONOSCOPY     CYSTOSCOPY WITH RETROGRADE PYELOGRAM, URETEROSCOPY AND STENT PLACEMENT Bilateral 11/02/2020   Procedure: DIAGNOSTIC CYSTOSCOPY WITH RETROGRADE PYELOGRAM,BILATERAL DIAGNOSTIC URETEROSCOPY  AND BILATERAL STENT PLACEMENT;  Surgeon: Elisabeth Valli BIRCH, MD;  Location: [facility];  Service: Urology;  Laterality: Bilateral;  90 MINS   DG TUMB RIGHT HAND Right    cyst removal   IR THORACENTESIS ASP PLEURAL SPACE W/IMG GUIDE  05/31/2018   JOINT REPLACEMENT Left    KNEE ARTHROSCOPY Left 12/16/2020   Procedure: LEFT KNEE ARTHROSCOPY WITH PARTIAL LATERAL MENISCECTOMY;  Surgeon: Vernetta Bruckner GRADE, MD;  Location: [facility];  Service: Orthopedics;  Laterality: Left;   THORACIC AORTIC ENDOVASCULAR STENT GRAFT Left 06/13/2018   Procedure: LEFT  SUBCLAVIAN TO CAROTID ARTERY TRANSPOSITION; THORACIC AORTIC ENDOVASCULAR STENT GRAFT using GORE CONFORMABLE THORACIC STENT GRAFT AND GORE TAG THORACIC ENDOPROSTHESIS; STENT LEFT COMMON CAROTID ARTERY, RIGHT COMMON-FEMORAL ENDARTERECTOMY WITH PATCH ANGIOPLASTY;  Surgeon: Sheree Penne Bruckner, MD;  Location: [facility] OR;  Service: Vascular;  Laterality: Left;   TONSILLECTOMY     TOTAL HIP ARTHROPLASTY Left 01/03/2013   Procedure: TOTAL HIP ARTHROPLASTY ANTERIOR APPROACH;  Surgeon: Bruckner GRADE Vernetta, MD;  Location: [facility] ORS;  Service: Orthopedics;  Laterality: Left;  Left Total Hip Arthroplasty, Anterior Approach   TUBAL LIGATION     VAGINA RECONSTRUCTION SURGERY  1966   vagina had closed up when 73 years old   WOUND EXPLORATION Left 06/16/2018   Procedure: LEFT NECK WOUND EXPLORATION, CHEST TUBE INSERTION;  Surgeon: Sheree Penne Bruckner, MD;  Location: [facility] OR;  Service: Vascular;  Laterality: Left;   Social History   Tobacco Use   Smoking status: Never   Smokeless tobacco: Never  Vaping Use   Vaping status: Never Used  Substance Use Topics   Alcohol use: Not Currently   Drug use: Never   Family History  Problem Relation Age of Onset   Heart disease Father    Diabetes Mother    Heart disease Mother        pacemaker   Heart attack Mother 17   Diabetes Sister    Alcohol abuse Other    Heart disease Other    Hyperlipidemia Other    Hypertension Other    Diabetes Other    Breast cancer Other    Colon cancer Neg Hx    Esophageal cancer Neg Hx    Stomach cancer Neg Hx    Rectal cancer Neg Hx    Allergies  Allergen Reactions   Penicillins Swelling and Other (See Comments)    Severe swelling   Shellfish Allergy Anaphylaxis   Latex Hives and Rash   Asa [Aspirin ] Nausea And Vomiting    Makes stomach  upset Asprin 325   Banana Nausea Only   Celebrex [Celecoxib] Nausea And Vomiting    GI upset   Kiwi Extract Rash   Lactose Intolerance (Gi) Diarrhea and Nausea And Vomiting   Nsaids Rash   Oxycodone  Other (See Comments)    Pt stated, Made me feel really drowsy Tolerating as of 02/16/20 after neck surgery    ROS Negative unless stated above    Objective:     BP 122/78   Pulse 75   Temp (!) 97.1 F (36.2 C)   Ht 5' 4 (1.626 m)   Wt 160 lb (72.6 kg)   SpO2 99%   BMI 27.46 kg/m  BP Readings from Last 3 Encounters:  08/04/24 122/78  07/28/24 112/71  06/26/24 126/72   Wt Readings from Last 3 Encounters:  08/04/24 160 lb (72.6 kg)  07/28/24 161 lb (73 kg)  06/26/24 161 lb (73 kg)      Physical Exam Constitutional:      General: She is not in acute distress.    Appearance: Normal appearance.  HENT:     Head: Normocephalic and atraumatic.     Comments: B/l TMS full.  Small piece of firm cerumen in L canal not occluding or obstructing view of TM.    Nose: Nose normal.      Mouth/Throat:     Mouth: Mucous membranes are moist.  Cardiovascular:     Rate and Rhythm: Normal rate and regular rhythm.     Heart sounds: Normal heart sounds. No murmur heard.    No gallop.  Pulmonary:     Effort: Pulmonary effort is normal. No respiratory distress.     Breath sounds: Normal breath sounds. No wheezing, rhonchi or rales.  Skin:    General: Skin is warm and dry.  Neurological:     Mental Status: She is alert and oriented to person, place, and time.        01/28/2024    4:05 PM 01/28/2024    4:04 PM 01/03/2024   11:25 AM  Depression screen PHQ 2/9  Decreased Interest 0 0 0  Down, Depressed, Hopeless 0 0 0  PHQ - 2 Score 0 0 0  Altered sleeping 0 0 0  Tired, decreased energy 0 0 0  Change in appetite 0 0 0  Feeling bad or failure about yourself  0 0 0  Trouble concentrating 0 0 0  Moving slowly or fidgety/restless 0 0 0  Suicidal thoughts 0 0 0  PHQ-9 Score 0 0 0  Difficult doing work/chores Not difficult at all Not difficult at all Not difficult at all      01/03/2024   11:25 AM 11/15/2023    9:17 AM 05/25/2023    8:55 AM  GAD 7 : Generalized Anxiety Score  Nervous, Anxious, on Edge 0 0 0  Control/stop worrying 0 0 0  Worry too much - different things 0 0 0  Trouble relaxing 0 1 0  Restless 0 1 0  Easily annoyed or irritable 0 0 0  Afraid - awful might happen 0 0 0  Total GAD 7 Score 0 2 0  Anxiety Difficulty Not difficult at all Not difficult at all      No results found for any visits on 08/04/24.    Assessment & Plan:   Allergic rhinitis, unspecified seasonality, unspecified trigger  Eustachian tube dysfunction, bilateral  Post-nasal drainage  Post-nasal drainage and eustachian tube dysfunction likely from seasonal allergies causing symptoms of cough, ear  pain, scratchy throat, rhinorrhea, HA, etc.  Start OTC antihistamine.  Given sample of Allegra.   Advised to try.  If not helpful try a different antihistamine.  Notify clinic for  continued or worsened sx.  Return if symptoms worsen or fail to improve.   Clotilda JONELLE Single, MD

## 2024-08-05 ENCOUNTER — Ambulatory Visit: Payer: Self-pay | Admitting: Gastroenterology

## 2024-08-13 ENCOUNTER — Ambulatory Visit (HOSPITAL_BASED_OUTPATIENT_CLINIC_OR_DEPARTMENT_OTHER): Attending: Cardiology | Admitting: Cardiology

## 2024-08-13 DIAGNOSIS — G4730 Sleep apnea, unspecified: Secondary | ICD-10-CM

## 2024-08-13 DIAGNOSIS — G4733 Obstructive sleep apnea (adult) (pediatric): Secondary | ICD-10-CM | POA: Insufficient documentation

## 2024-08-19 NOTE — Procedures (Signed)
  Indications for Polysomnography The patient is a 73 year old Female who is 5' 4 and weighs 164.0 lbs. Her BMI equals 28.3.  A full night titration treatment study was performed.  No medications were reported taken during the night.No Data. Polysomnogram Data A full night polysomnogram recorded the standard physiologic parameters including EEG, EOG, EMG, EKG, nasal and oral airflow.  Respiratory parameters of chest and abdominal movements were recorded with Respiratory Inductance Plethysmography belts.   Oxygen saturation was recorded by pulse oximetry.  Sleep Architecture The total recording time of the polysomnogram was 366.5 minutes.  The total sleep time was 192.0 minutes.  The patient spent 7.8% of total sleep time in Stage N1, 63.3% in Stage N2, 2.3% in Stages N3, and 26.6% in REM.  Sleep latency was 43.0 minutes.   REM latency was 59.0 minutes.  Sleep Efficiency was 52.4%.  Wake after Sleep Onset time was 131.5 minutes.  Titration Summary The patient was titrated at pressures ranging from 6 cm/H20 up to 14 cm/H20. The last pressure used in the study was 14 cm/H20.  Respiratory Events The polysomnogram revealed a presence of 13 obstructive, 0 central, and 0 mixed apneas resulting in an Apnea index of 4.1 events per hour.  There were 42 hypopneas (GreaterEqual to3% desaturation and/or arousal) resulting in an Apnea\Hypopnea Index (AHI  GreaterEqual to3% desaturation and/or arousal) of 17.2 events per hour.  There were 24 hypopneas (GreaterEqual to4% desaturation) resulting in an Apnea\Hypopnea Index (AHI GreaterEqual to4% desaturation) of 11.6 events per hour.  There were 9 Respiratory  Effort Related Arousals resulting in a RERA index of 2.8 events per hour. The Respiratory Disturbance Index is 20.0 events per hour.  The snore index was 0 events per hour.  Mean oxygen saturation was 95.3%.  The lowest oxygen saturation during sleep was 77.0%.  Time spent LessEqual to88% oxygen saturation  was  minutes ().  Limb Activity There were 0 limb movements recorded.  Cardiac Summary The average pulse rate was 64.9 bpm.  The minimum pulse rate was 53.0 bpm while the maximum pulse rate was 132.0 bpm.  Cardiac rhythm was normal sinus rhythm with PVCs.  Diagnosis: Obstructive sleep apnea Successful CPAP titration.       Recommendations: 1. Recommend a trial of ResMed CPAP at 14 cm H2O with heated humidity, EPR 2 cm H2O and small AirFit F10 mask 2. Close follow-up is necessary to ensure success with CPAP therapy for maximum benefit. 3. A follow-up oximetry study on CPAP is recommended to assess the adequacy of therapy and determine the need for supplemental oxygen or the potential need for Bi-level therapy.  An arterial blood gas to determine the adequacy of baseline ventilation and  oxygenation should also be considered. 4. Healthy sleep recommendations include:  adequate nightly sleep (normal 7-9 hrs/night), avoidance of caffeine after noon and alcohol near bedtime, and maintaining a sleep environment that is cool, dark and quiet. 5. Weight loss for overweight patients is recommended.  Even modest amounts of weight loss can significantly improve the severity of sleep apnea. 6. Snoring recommendations include:  weight loss where appropriate, side sleeping, and avoidance of alcohol before bed. 7. Operation of motor vehicle should be avoided when sleepy.    This study was personally reviewed and electronically signed by: Dr. Wilbert Bihari Accredited Board Certified in Sleep Medicine Date/Time: 08/19/2024 6:32 PM

## 2024-08-25 ENCOUNTER — Telehealth: Payer: Self-pay | Admitting: *Deleted

## 2024-08-25 NOTE — Telephone Encounter (Signed)
-----   Message from Wilbert Bihari sent at 08/19/2024  6:33 PM EDT ----- Please let patient know that they had a successful PAP titration and let DME know that orders are in EPIC.  Please set up 6 week OV with me.

## 2024-08-25 NOTE — Telephone Encounter (Signed)
 The patient has been notified of the result and verbalized understanding.  All questions (if any) were answered. Samantha Stevenson, CMA 08/25/2024 4:07 PM    Upon patient request DME selection is [facility] Patient understands he will be contacted by [facility] to set up his cpap. Patient understands to call if [facility] does not contact him with new setup in a timely manner. Patient understands they will be called once confirmation has been received from [facility] that they have received their new machine to schedule 10 week follow up appointment.   [facility] notified of new cpap order  Please add to airview Patient was grateful for the call and thanked me.

## 2024-09-01 ENCOUNTER — Other Ambulatory Visit: Payer: Self-pay | Admitting: Physician Assistant

## 2024-09-01 DIAGNOSIS — I480 Paroxysmal atrial fibrillation: Secondary | ICD-10-CM

## 2024-09-15 DIAGNOSIS — Z1231 Encounter for screening mammogram for malignant neoplasm of breast: Secondary | ICD-10-CM | POA: Diagnosis not present

## 2024-09-15 LAB — HM MAMMOGRAPHY

## 2024-10-20 ENCOUNTER — Other Ambulatory Visit: Payer: Self-pay | Admitting: Physician Assistant

## 2024-11-03 ENCOUNTER — Encounter: Payer: Self-pay | Admitting: *Deleted

## 2024-11-05 ENCOUNTER — Ambulatory Visit (INDEPENDENT_AMBULATORY_CARE_PROVIDER_SITE_OTHER): Admitting: Family Medicine

## 2024-11-05 ENCOUNTER — Encounter: Payer: Self-pay | Admitting: Family Medicine

## 2024-11-05 VITALS — BP 128/72 | HR 73 | Temp 98.0°F | Ht 64.0 in | Wt 157.6 lb

## 2024-11-05 DIAGNOSIS — J3489 Other specified disorders of nose and nasal sinuses: Secondary | ICD-10-CM | POA: Diagnosis not present

## 2024-11-05 DIAGNOSIS — J0100 Acute maxillary sinusitis, unspecified: Secondary | ICD-10-CM

## 2024-11-05 DIAGNOSIS — R0981 Nasal congestion: Secondary | ICD-10-CM

## 2024-11-05 MED ORDER — DOXYCYCLINE HYCLATE 100 MG PO TABS: 100.0000 mg | ORAL_TABLET | Freq: Two times a day (BID) | ORAL | 0 refills | Status: AC

## 2024-11-05 MED ORDER — FLUTICASONE PROPIONATE 50 MCG/ACT NA SUSP: 1.0000 | Freq: Every day | NASAL | 0 refills | Status: AC

## 2024-11-05 NOTE — Progress Notes (Signed)
 "  Established Patient Office Visit   Subjective  Patient ID: Kyung Muto, female    DOB: 1951-06-27  Age: 74 y.o. MRN: [id number]  Chief Complaint  Patient presents with   Acute Visit    Patient came in today for sinus pain and headaches runny nose that started a month ago sharp shooting pain up the nose     Patient is a 74 year old female seen for ongoing concern.  Patient endorses headaches starting last month.  Patient then developed rhinorrhea, pain in sinuses x 2 weeks.  States had a sharp pain in R nares that went up into head.  Pain worse at night.  Denies cough, SOB, ear pain/pressure.  Tried Tylenol  for headaches which helps.  Has not taken any other meds for symptoms.  PCN allergy.  On eliquis for Afib.      Patient Active Problem List   Diagnosis Date Noted   Stenosis of cervical spine with myelopathy (HCC) 01/21/2024   History of renal calculi 01/07/2024   Urinary frequency 09/07/2023   Overactive bladder 09/07/2023   Urinary tract infection without hematuria 09/07/2023   Prolapse of posterior vaginal wall 09/05/2023   Bilateral arm pain 04/19/2022   Other fatigue 04/19/2022   Chronic diastolic CHF (congestive heart failure) (HCC) 11/24/2020   Acute lateral meniscal tear, left, subsequent encounter 11/16/2020   Cervical spondylosis with myelopathy and radiculopathy 02/02/2020   Chronic neck pain 07/17/2019   LLQ pain 05/16/2019   Chronic bilateral low back pain without sciatica 10/07/2018   Pain and swelling of left lower leg 08/08/2018   Hematuria, gross 07/29/2018   Thoracic aortic dissection (HCC) 06/13/2018   Intramural aortic hematoma (HCC) 05/28/2018   Bilateral leg edema 02/27/2018   Routine general medical examination at a health care facility 06/24/2015   Constipation 03/17/2015   Myalgia and myositis 03/17/2015   Dyslipidemia    Atrial fibrillation (HCC)    Hypertension    History of pulmonary embolism 01/10/2013   Anemia 01/10/2013   Arthritis  01/03/2013   Past Medical History:  Diagnosis Date   Allergy    See history   Anemia    Family history   Arthritis    qwhere (07/29/2018)   Atrial fibrillation (HCC)    CHF (congestive heart failure) [facility])    My father and mother   Clotting disorder 07/2019   Once in2020, and on going in 2022   Degenerative arthritis of hip    s/p L THR 12/2012   Dyspnea    since surgery in August 2019- when I get worked up and walk a short distance   Dysrhythmia    History of blood transfusion 05/2018   related to OR   Hyperlipidemia    Hypertension    See history   Intramural hematoma of thoracic aorta (HCC) 06/13/2018   Migraines    mIgraines- none since blood pressure and lipids are under control   Myocardial infarction (HCC) 2000   due to atrial fib   Neuromuscular disorder (HCC)    pinched nerve- left side of neck   Pre-diabetes    Pulmonary emboli (HCC) 12/2012 dx   post op (L THR), anticoag x 80mo   Past Surgical History:  Procedure Laterality Date   ABDOMINAL HYSTERECTOMY  1986   w/1 tube   ANTERIOR CERVICAL DECOMP/DISCECTOMY FUSION N/A 01/21/2024   Procedure: ANTERIOR CERVICAL DECOMPRESSION/DISCECTOMY FUSION CERVICAL TWO-CERVICAL THREE;  Surgeon: Louis Shove, MD;  Location: [facility] OR;  Service: Neurosurgery;  Laterality: N/A;  ACDF -  C2-C3   ANTERIOR CERVICAL DECOMPRESSION/DISCECTOMY FUSION 4 LEVELS N/A 02/02/2020   Procedure: CERVICAL THREE-FOUR, CERVICAL FOUR-FIVE, CERVICAL FIVE-SIX, CERVICAL SIX-SEVEN ANTERIOR CERVICAL DECOMPRESSION/DISCECTOMY FUSION;  Surgeon: Louis Shove, MD;  Location: [facility] OR;  Service: Neurosurgery;  Laterality: N/A;   ARTERY REPAIR Left 08/15/2018   Procedure: LEFT BRACHIAL ARTERY EXPLORATION;  Surgeon: Sheree Penne Bruckner, MD;  Location: [facility] OR;  Service: Vascular;  Laterality: Left;   Breast Ultrasound Left 04/16/2013   Done @ breast center Impression: no malignancy appearance noted on the screen study is consistent with a summation shadow    BUNIONECTOMY Left    CARDIAC CATHETERIZATION     CAROTID-SUBCLAVIAN BYPASS GRAFT Left 06/13/2018   Procedure: BYPASS GRAFT CAROTID-SUBCLAVIAN;  Surgeon: Sheree Penne Bruckner, MD;  Location: [facility] OR;  Service: Vascular;  Laterality: Left;   COLONOSCOPY     CYSTOSCOPY WITH RETROGRADE PYELOGRAM, URETEROSCOPY AND STENT PLACEMENT Bilateral 11/02/2020   Procedure: DIAGNOSTIC CYSTOSCOPY WITH RETROGRADE PYELOGRAM,BILATERAL DIAGNOSTIC URETEROSCOPY  AND BILATERAL STENT PLACEMENT;  Surgeon: Elisabeth Valli BIRCH, MD;  Location: [facility] [place];  Service: Urology;  Laterality: Bilateral;  90 MINS   DG TUMB RIGHT HAND Right    cyst removal   IR THORACENTESIS RIGHT ASP PLEURAL SPACE W/IMG GUIDE  05/31/2018   JOINT REPLACEMENT Left 2014   Left hip   KNEE ARTHROSCOPY Left 12/16/2020   Procedure: LEFT KNEE ARTHROSCOPY WITH PARTIAL LATERAL MENISCECTOMY;  Surgeon: Vernetta Bruckner GRADE, MD;  Location: [facility];  Service: Orthopedics;  Laterality: Left;   SPINE SURGERY     THORACIC AORTIC ENDOVASCULAR STENT GRAFT Left 06/13/2018   Procedure: LEFT  SUBCLAVIAN TO CAROTID ARTERY TRANSPOSITION; THORACIC AORTIC ENDOVASCULAR STENT GRAFT using GORE CONFORMABLE THORACIC STENT GRAFT AND GORE TAG THORACIC ENDOPROSTHESIS; STENT LEFT COMMON CAROTID ARTERY, RIGHT COMMON-FEMORAL ENDARTERECTOMY WITH PATCH ANGIOPLASTY;  Surgeon: Sheree Penne Bruckner, MD;  Location: [facility] OR;  Service: Vascular;  Laterality: Left;   TONSILLECTOMY     TOTAL HIP ARTHROPLASTY Left 01/03/2013   Procedure: TOTAL HIP ARTHROPLASTY ANTERIOR APPROACH;  Surgeon: Bruckner GRADE Vernetta, MD;  Location: [facility] ORS;  Service: Orthopedics;  Laterality: Left;  Left Total Hip Arthroplasty, Anterior Approach   TUBAL LIGATION  1986   VAGINA RECONSTRUCTION SURGERY  1966   vagina had closed up when 74 years old   WOUND EXPLORATION Left 06/16/2018   Procedure: LEFT NECK WOUND EXPLORATION, CHEST TUBE INSERTION;  Surgeon: Sheree Penne Bruckner, MD;  Location: [facility] OR;  Service: Vascular;  Laterality: Left;   Social History[1] Family History  Problem Relation Age of Onset   Heart disease Father    Arthritis Father    Hypertension Father    Diabetes Mother    Heart disease Mother        pacemaker   Heart attack Mother 8   Arthritis Mother    Hypertension Mother    Miscarriages / Stillbirths Mother    Stroke Mother    Varicose Veins Mother    Diabetes Sister    Alcohol abuse Other    Heart disease Other    Hyperlipidemia Other    Hypertension Other    Diabetes Other    Breast cancer Other    Early death Brother    Colon cancer Neg Hx    Esophageal cancer Neg Hx    Stomach cancer Neg Hx    Rectal cancer Neg Hx    Allergies[2]  ROS Negative unless stated above    Objective:     BP 128/72 (BP Location: Left  Arm, Patient Position: Sitting, Cuff Size: Large)   Pulse 73   Temp 98 F (36.7 C) (Oral)   Ht 5' 4 (1.626 m)   Wt 157 lb 9.6 oz (71.5 kg)   SpO2 95%   BMI 27.05 kg/m  BP Readings from Last 3 Encounters:  11/05/24 128/72  08/04/24 122/78  07/28/24 112/71   Wt Readings from Last 3 Encounters:  11/05/24 157 lb 9.6 oz (71.5 kg)  08/13/24 159 lb (72.1 kg)  08/04/24 160 lb (72.6 kg)      Physical Exam Constitutional:      General: She is not in acute distress.    Appearance: Normal appearance.  HENT:     Head: Normocephalic and atraumatic.     Right Ear: Hearing and tympanic membrane normal.     Left Ear: Hearing normal.     Ears:     Comments: L TM full without erythema or suppurative fluid.      Nose: No mucosal edema, congestion or rhinorrhea.     Right Sinus: Maxillary sinus tenderness present. No frontal sinus tenderness.     Left Sinus: No maxillary sinus tenderness or frontal sinus tenderness.     Mouth/Throat:     Mouth: Mucous membranes are moist.     Dentition: Has dentures.     Comments: Mallampati 4 Cardiovascular:     Rate and Rhythm: Normal rate and regular  rhythm.     Heart sounds: Normal heart sounds. No murmur heard.    No gallop.  Pulmonary:     Effort: Pulmonary effort is normal. No respiratory distress.     Breath sounds: Normal breath sounds. No wheezing, rhonchi or rales.  Skin:    General: Skin is warm and dry.  Neurological:     Mental Status: She is alert and oriented to person, place, and time.        01/28/2024    4:05 PM 01/28/2024    4:04 PM 01/03/2024   11:25 AM  Depression screen PHQ 2/9  Decreased Interest 0 0 0  Down, Depressed, Hopeless 0 0 0  PHQ - 2 Score 0 0 0  Altered sleeping 0 0 0  Tired, decreased energy 0 0 0  Change in appetite 0 0 0  Feeling bad or failure about yourself  0 0 0  Trouble concentrating 0 0 0  Moving slowly or fidgety/restless 0 0 0  Suicidal thoughts 0 0 0  PHQ-9 Score 0  0  0   Difficult doing work/chores Not difficult at all Not difficult at all Not difficult at all     Data saved with a previous flowsheet row definition      01/03/2024   11:25 AM 11/15/2023    9:17 AM 05/25/2023    8:55 AM  GAD 7 : Generalized Anxiety Score  Nervous, Anxious, on Edge 0 0 0  Control/stop worrying 0 0 0  Worry too much - different things 0 0 0  Trouble relaxing 0 1 0  Restless 0 1 0  Easily annoyed or irritable 0 0 0  Afraid - awful might happen 0 0 0  Total GAD 7 Score 0 2 0  Anxiety Difficulty Not difficult at all Not difficult at all      No results found for any visits on 11/05/24.    Assessment & Plan:   Acute maxillary sinusitis, recurrence not specified -     Fluticasone  Propionate; Place 1 spray into both nostrils daily.  Dispense: 16 g;  Refill: 0 -     Doxycycline  Hyclate; Take 1 tablet (100 mg total) by mouth 2 (two) times daily for 7 days.  Dispense: 14 tablet; Refill: 0  Sinus congestion -     Fluticasone  Propionate; Place 1 spray into both nostrils daily.  Dispense: 16 g; Refill: 0  Rhinorrhea  Acute sinusitis.  Start abx and flonase .  Allergy to PCN.  H/o Afib on  Eliquis.  Regular allergy med like allegra advised or saline nasal rinse.  For continued issues referral to ENT.  Return if symptoms worsen or fail to improve.   Clotilda JONELLE Single, MD     [1]  Social History Tobacco Use   Smoking status: Never   Smokeless tobacco: Never  Vaping Use   Vaping status: Never Used  Substance Use Topics   Alcohol use: Not Currently   Drug use: Never  [2]  Allergies Allergen Reactions   Penicillins Swelling and Other (See Comments)    Severe swelling   Shellfish Allergy Anaphylaxis   Latex Hives and Rash   Dorethia Shaw ] Nausea And Vomiting    Makes stomach upset Asprin 325   Banana Nausea Only   Celebrex [Celecoxib] Nausea And Vomiting    GI upset   Kiwi Extract Rash   Lactose Intolerance (Gi) Diarrhea and Nausea And Vomiting   Nsaids Rash   Oxycodone  Other (See Comments)    Pt stated, Made me feel really drowsy Tolerating as of 02/16/20 after neck surgery   "

## 2024-11-27 ENCOUNTER — Ambulatory Visit: Payer: Self-pay

## 2024-11-27 NOTE — Telephone Encounter (Signed)
 FYI Only or Action Required?: FYI only for provider: appointment scheduled on 11/28/24.  Patient was last seen in primary care on 11/05/2024 by [name], MD.  Called Nurse Triage reporting Headache.  Symptoms began a week ago.  Interventions attempted: OTC medications: tylenol  and Other: counter pressure/massage.  Symptoms are: gradually worsening.  Triage Disposition: See Physician Within 24 Hours  Patient/caregiver understands and will follow disposition?: Yes   Message from Wrigley T sent at 11/27/2024  3:38 PM EST  Summary: Worsening headache   Reason for Triage: Pt calling reports she is having worsening, really bad headaches, and having unbearable pain, and whole left side of face and ear pain. Escalates from left side to op of head.  Pt is requesting for evaluation as soon as possible.            Reason for Disposition  [1] MODERATE headache (e.g., interferes with normal activities) AND [2] present > 24 hours AND [3] unexplained  (Exceptions: Pain medicines not tried, typical migraine, or headache part of viral illness.)  Answer Assessment - Initial Assessment Questions Pt called to schedule appt with PCP for worsening h/a. Pt has had mild h/a that has worsened over the past week. Pt reports pain is whole head, especially L side and radiating to the top of her scalp. Pt did recently get over a sinus infection so unsure if it is residual pressure or r/t her CPAP machine. Pt reports pain worsens at night so she has not been wearing CPAP which is effecting her sleep. Pt denies any changes with pain with light vs. Flashing from tv. No improvement with tylenol  or scalp massage or counter pressure. Pt rates pain currently 5/10 but 10/10 pain at night; reports sharp pain throughout the day that comes and goes. Appointment scheduled for evaluation. Patient agrees with plan of care, and will call back if anything changes, or if symptoms worsen.       1. LOCATION: Where does  it hurt?      Whole head, especially L side to top of head   2. ONSET: When did the headache start? (e.g., minutes, hours, days)      X 1 week   3. PATTERN: Does the pain come and go, or has it been constant since it started?     Constant pain but does ease throughout the day   4. SEVERITY: How bad is the pain? and What does it keep you from doing?  (e.g., Scale 1-10; mild, moderate, or severe)     Currently 5/10 while talking to NT; at night 10/10  5. RECURRENT SYMPTOM: Have you ever had headaches before? If Yes, ask: When was the last time? and What happened that time?      No   6. CAUSE: What do you think is causing the headache?     Unsure   7. MIGRAINE: Have you been diagnosed with migraine headaches? If Yes, ask: Is this headache similar?      No   8. HEAD INJURY: Has there been any recent injury to your head?      No   9. OTHER SYMPTOMS: Do you have any other symptoms? (e.g., fever, stiff neck, eye pain, sore throat, cold symptoms)     None; pt did just get over a sinus infection  Protocols used: Headache-A-AH

## 2024-11-27 NOTE — Telephone Encounter (Signed)
 Appt on 2/6

## 2024-11-28 ENCOUNTER — Telehealth: Payer: Self-pay

## 2024-11-28 ENCOUNTER — Ambulatory Visit: Admitting: Family Medicine

## 2024-11-28 ENCOUNTER — Encounter: Payer: Self-pay | Admitting: Family Medicine

## 2024-11-28 VITALS — BP 128/72 | HR 67 | Temp 98.0°F | Ht 64.0 in | Wt 157.4 lb

## 2024-11-28 DIAGNOSIS — H6992 Unspecified Eustachian tube disorder, left ear: Secondary | ICD-10-CM

## 2024-11-28 DIAGNOSIS — G4733 Obstructive sleep apnea (adult) (pediatric): Secondary | ICD-10-CM

## 2024-11-28 DIAGNOSIS — G43909 Migraine, unspecified, not intractable, without status migrainosus: Secondary | ICD-10-CM

## 2024-11-28 NOTE — Progress Notes (Signed)
 "  Established Patient Office Visit   Subjective  Patient ID: Palmina Clodfelter, female    DOB: [birth year]  Age: 74 y.o. MRN: [id number]  Chief Complaint  Patient presents with   Acute Visit    Patient came in today for headaches started a week ago, left side       Patient is a 74 year old female seen for ongoing concern.  Patient endorses headaches times unclear duration.  Somewhat better this week.  Reports pain typically in frontal region and left side of face, ear, and neck.  Concerned as typically does not get headaches throat relator reports a history of migraines several years ago.  States headaches similar to migraines.  Tried Tylenol  severe cold and flu, Tylenol , and cough medication for symptoms.  Denies nausea, vomiting, sore throat.  Treated for sinus infection 1/14.  Unsure if symptoms completely resolved or returned after antibiotics.  Feels like needs to blow nose but nothing comes out.  Endorses postnasal drainage.  Unsure if Flonase  is helping. Unable to wear CPAP due to discomfort.  Denies increased stress.  States drinking plenty of water .  BP at home 130s/73.    Patient Active Problem List   Diagnosis Date Noted   Stenosis of cervical spine with myelopathy (HCC) 01/21/2024   History of renal calculi 01/07/2024   Urinary frequency 09/07/2023   Overactive bladder 09/07/2023   Urinary tract infection without hematuria 09/07/2023   Prolapse of posterior vaginal wall 09/05/2023   Bilateral arm pain 04/19/2022   Other fatigue 04/19/2022   Chronic diastolic CHF (congestive heart failure) (HCC) 11/24/2020   Acute lateral meniscal tear, left, subsequent encounter 11/16/2020   Cervical spondylosis with myelopathy and radiculopathy 02/02/2020   Chronic neck pain 07/17/2019   LLQ pain 05/16/2019   Chronic bilateral low back pain without sciatica 10/07/2018   Pain and swelling of left lower leg 08/08/2018   Hematuria, gross 07/29/2018   Thoracic aortic dissection (HCC)  06/13/2018   Intramural aortic hematoma (HCC) 05/28/2018   Bilateral leg edema 02/27/2018   Routine general medical examination at a health care facility 06/24/2015   Constipation 03/17/2015   Myalgia and myositis 03/17/2015   Dyslipidemia    Atrial fibrillation (HCC)    Hypertension    History of pulmonary embolism 01/10/2013   Anemia 01/10/2013   Arthritis 01/03/2013   Past Medical History:  Diagnosis Date   Allergy    See history   Anemia    Family history   Arthritis    qwhere (07/29/2018)   Atrial fibrillation (HCC)    CHF (congestive heart failure) (HCC)    My father and mother   Clotting disorder 07/2019   Once in2020, and on going in 2022   Degenerative arthritis of hip    s/p L THR 12/2012   Dyspnea    since surgery in August 2019- when I get worked up and walk a short distance   Dysrhythmia    History of blood transfusion 05/2018   related to OR   Hyperlipidemia    Hypertension    See history   Intramural hematoma of thoracic aorta (HCC) 06/13/2018   Migraines    mIgraines- none since blood pressure and lipids are under control   Myocardial infarction (HCC) 2000   due to atrial fib   Neuromuscular disorder (HCC)    pinched nerve- left side of neck   Pre-diabetes    Pulmonary emboli (HCC) 12/2012 dx   post op (L THR), anticoag x 72mo  Past Surgical History:  Procedure Laterality Date   ABDOMINAL HYSTERECTOMY  1986   w/1 tube   ANTERIOR CERVICAL DECOMP/DISCECTOMY FUSION N/A 01/21/2024   Procedure: ANTERIOR CERVICAL DECOMPRESSION/DISCECTOMY FUSION CERVICAL TWO-CERVICAL THREE;  Surgeon: Louis Shove, MD;  Location: [facility] OR;  Service: Neurosurgery;  Laterality: N/A;  ACDF - C2-C3   ANTERIOR CERVICAL DECOMPRESSION/DISCECTOMY FUSION 4 LEVELS N/A 02/02/2020   Procedure: CERVICAL THREE-FOUR, CERVICAL FOUR-FIVE, CERVICAL FIVE-SIX, CERVICAL SIX-SEVEN ANTERIOR CERVICAL DECOMPRESSION/DISCECTOMY FUSION;  Surgeon: Louis Shove, MD;  Location: [facility] OR;  Service:  Neurosurgery;  Laterality: N/A;   ARTERY REPAIR Left 08/15/2018   Procedure: LEFT BRACHIAL ARTERY EXPLORATION;  Surgeon: Sheree Penne Bruckner, MD;  Location: [facility] OR;  Service: Vascular;  Laterality: Left;   Breast Ultrasound Left 04/16/2013   Done @ breast center Impression: no malignancy appearance noted on the screen study is consistent with a summation shadow   BUNIONECTOMY Left    CARDIAC CATHETERIZATION     CAROTID-SUBCLAVIAN BYPASS GRAFT Left 06/13/2018   Procedure: BYPASS GRAFT CAROTID-SUBCLAVIAN;  Surgeon: Sheree Penne Bruckner, MD;  Location: [facility] OR;  Service: Vascular;  Laterality: Left;   COLONOSCOPY     CYSTOSCOPY WITH RETROGRADE PYELOGRAM, URETEROSCOPY AND STENT PLACEMENT Bilateral 11/02/2020   Procedure: DIAGNOSTIC CYSTOSCOPY WITH RETROGRADE PYELOGRAM,BILATERAL DIAGNOSTIC URETEROSCOPY  AND BILATERAL STENT PLACEMENT;  Surgeon: Elisabeth Valli BIRCH, MD;  Location: [facility];  Service: Urology;  Laterality: Bilateral;  90 MINS   DG TUMB RIGHT HAND Right    cyst removal   IR THORACENTESIS RIGHT ASP PLEURAL SPACE W/IMG GUIDE  05/31/2018   JOINT REPLACEMENT Left 2014   Left hip   KNEE ARTHROSCOPY Left 12/16/2020   Procedure: LEFT KNEE ARTHROSCOPY WITH PARTIAL LATERAL MENISCECTOMY;  Surgeon: Vernetta Bruckner GRADE, MD;  Location: [facility];  Service: Orthopedics;  Laterality: Left;   SPINE SURGERY     THORACIC AORTIC ENDOVASCULAR STENT GRAFT Left 06/13/2018   Procedure: LEFT  SUBCLAVIAN TO CAROTID ARTERY TRANSPOSITION; THORACIC AORTIC ENDOVASCULAR STENT GRAFT using GORE CONFORMABLE THORACIC STENT GRAFT AND GORE TAG THORACIC ENDOPROSTHESIS; STENT LEFT COMMON CAROTID ARTERY, RIGHT COMMON-FEMORAL ENDARTERECTOMY WITH PATCH ANGIOPLASTY;  Surgeon: Sheree Penne Bruckner, MD;  Location: [facility] OR;  Service: Vascular;  Laterality: Left;   TONSILLECTOMY     TOTAL HIP ARTHROPLASTY Left 01/03/2013   Procedure: TOTAL HIP ARTHROPLASTY ANTERIOR APPROACH;   Surgeon: Bruckner GRADE Vernetta, MD;  Location: [facility] ORS;  Service: Orthopedics;  Laterality: Left;  Left Total Hip Arthroplasty, Anterior Approach   TUBAL LIGATION  1986   VAGINA RECONSTRUCTION SURGERY  1966   vagina had closed up when 74 years old   WOUND EXPLORATION Left 06/16/2018   Procedure: LEFT NECK WOUND EXPLORATION, CHEST TUBE INSERTION;  Surgeon: Sheree Penne Bruckner, MD;  Location: [facility] OR;  Service: Vascular;  Laterality: Left;   Social History[1] Family History  Problem Relation Age of Onset   Heart disease Father    Arthritis Father    Hypertension Father    Diabetes Mother    Heart disease Mother        pacemaker   Heart attack Mother 15   Arthritis Mother    Hypertension Mother    Miscarriages / Stillbirths Mother    Stroke Mother    Varicose Veins Mother    Diabetes Sister    Alcohol abuse Other    Heart disease Other    Hyperlipidemia Other    Hypertension Other    Diabetes Other    Breast cancer Other    Early  death Brother    Colon cancer Neg Hx    Esophageal cancer Neg Hx    Stomach cancer Neg Hx    Rectal cancer Neg Hx    Allergies[2]  ROS Negative unless stated above    Objective:     BP 128/72 (BP Location: Left Arm, Patient Position: Sitting, Cuff Size: Large)   Pulse 67   Temp 98 F (36.7 C) (Oral)   Ht 5' 4 (1.626 m)   Wt 157 lb 6.4 oz (71.4 kg)   SpO2 98%   BMI 27.02 kg/m  BP Readings from Last 3 Encounters:  11/28/24 128/72  11/05/24 128/72  08/04/24 122/78   Wt Readings from Last 3 Encounters:  11/28/24 157 lb 6.4 oz (71.4 kg)  11/05/24 157 lb 9.6 oz (71.5 kg)  08/13/24 159 lb (72.1 kg)      Physical Exam Constitutional:      General: She is not in acute distress.    Appearance: Normal appearance.  HENT:     Head: Normocephalic and atraumatic.     Right Ear: Tympanic membrane normal.     Ears:     Comments: L very TM full.  No erythema or suppurative fluid.    Nose: Nose normal. No nasal tenderness or  congestion.     Right Sinus: No maxillary sinus tenderness or frontal sinus tenderness.     Left Sinus: No maxillary sinus tenderness or frontal sinus tenderness.     Mouth/Throat:     Mouth: Mucous membranes are moist.  Cardiovascular:     Rate and Rhythm: Normal rate and regular rhythm.     Heart sounds: Normal heart sounds. No murmur heard.    No gallop.  Pulmonary:     Effort: Pulmonary effort is normal. No respiratory distress.     Breath sounds: Normal breath sounds. No wheezing, rhonchi or rales.  Skin:    General: Skin is warm and dry.  Neurological:     Mental Status: She is alert and oriented to person, place, and time.        11/28/2024    3:51 PM 01/28/2024    4:05 PM 01/28/2024    4:04 PM  Depression screen PHQ 2/9  Decreased Interest 1 0 0  Down, Depressed, Hopeless 0 0 0  PHQ - 2 Score 1 0 0  Altered sleeping 3 0 0  Tired, decreased energy 3 0 0  Change in appetite 1 0 0  Feeling bad or failure about yourself  0 0 0  Trouble concentrating 2 0 0  Moving slowly or fidgety/restless 0 0 0  Suicidal thoughts 0 0 0  PHQ-9 Score 10 0  0   Difficult doing work/chores Not difficult at all Not difficult at all Not difficult at all     Data saved with a previous flowsheet row definition      11/28/2024    3:51 PM 01/03/2024   11:25 AM 11/15/2023    9:17 AM 05/25/2023    8:55 AM  GAD 7 : Generalized Anxiety Score  Nervous, Anxious, on Edge 0 0  0  0   Control/stop worrying 0 0  0  0   Worry too much - different things 1 0  0  0   Trouble relaxing 1 0  1  0   Restless 0 0  1  0   Easily annoyed or irritable 2 0  0  0   Afraid - awful might happen 0 0  0  0   Total GAD 7 Score 4 0 2 0  Anxiety Difficulty Not difficult at all Not difficult at all Not difficult at all      Data saved with a previous flowsheet row definition     No results found for any visits on 11/28/24.    Assessment & Plan:   Dysfunction of left eustachian tube  Migraine without status  migrainosus, not intractable, unspecified migraine type -     CT HEAD WO CONTRAST ( ); Future  OSA (obstructive sleep apnea)  Acute headaches with increased severity.  Discussed possible causes including eustachian tube dysfunction, migraines, sinusitis, inability to wear CPAP.  Also consider mass.  Eustachian tube dysfunction likely given exam findings.  Discussed starting OTC antihistamine such as Allegra.  Samples given in clinic.  No current indication for abx.  Will obtain CT head to further evaluate more severe causes.  Given strict precautions.  Further recommendations based on results.  Return if symptoms worsen or fail to improve.   Clotilda JONELLE Single, MD      [1]  Social History Tobacco Use   Smoking status: Never   Smokeless tobacco: Never  Vaping Use   Vaping status: Never Used  Substance Use Topics   Alcohol use: Not Currently   Drug use: Never  [2]  Allergies Allergen Reactions   Penicillins Swelling and Other (See Comments)    Severe swelling   Shellfish Allergy Anaphylaxis   Latex Hives and Rash   Dorethia [place]cutt ] Nausea And Vomiting    Makes stomach upset Asprin 325   Banana Nausea Only   Celebrex [Celecoxib] Nausea And Vomiting    GI upset   Kiwi Extract Rash   Lactose Intolerance (Gi) Diarrhea and Nausea And Vomiting   Nsaids Rash   Oxycodone  Other (See Comments)    Pt stated, Made me feel really drowsy Tolerating as of 02/16/20 after neck surgery   "

## 2024-11-28 NOTE — Telephone Encounter (Signed)
 Samantha Stevenson

## 2024-12-01 ENCOUNTER — Other Ambulatory Visit

## 2024-12-03 ENCOUNTER — Ambulatory Visit: Admitting: Cardiology

## 2025-02-02 ENCOUNTER — Ambulatory Visit
